# Patient Record
Sex: Female | Born: 1935 | Race: White | Hispanic: No | Smoking: Former smoker
Health system: Southern US, Community
[De-identification: ages and names within clinical notes are randomized; demographics above are authoritative.]

## PROBLEM LIST (undated history)

## (undated) DIAGNOSIS — M810 Age-related osteoporosis without current pathological fracture: Secondary | ICD-10-CM

## (undated) DIAGNOSIS — R001 Bradycardia, unspecified: Secondary | ICD-10-CM

## (undated) DIAGNOSIS — J309 Allergic rhinitis, unspecified: Secondary | ICD-10-CM

## (undated) DIAGNOSIS — E7800 Pure hypercholesterolemia, unspecified: Secondary | ICD-10-CM

## (undated) DIAGNOSIS — I872 Venous insufficiency (chronic) (peripheral): Secondary | ICD-10-CM

## (undated) DIAGNOSIS — I2584 Coronary atherosclerosis due to calcified coronary lesion: Secondary | ICD-10-CM

## (undated) DIAGNOSIS — M1990 Unspecified osteoarthritis, unspecified site: Secondary | ICD-10-CM

## (undated) DIAGNOSIS — K449 Diaphragmatic hernia without obstruction or gangrene: Secondary | ICD-10-CM

## (undated) DIAGNOSIS — K529 Noninfective gastroenteritis and colitis, unspecified: Secondary | ICD-10-CM

## (undated) DIAGNOSIS — F419 Anxiety disorder, unspecified: Secondary | ICD-10-CM

## (undated) DIAGNOSIS — M545 Low back pain, unspecified: Secondary | ICD-10-CM

## (undated) DIAGNOSIS — D099 Carcinoma in situ, unspecified: Secondary | ICD-10-CM

## (undated) DIAGNOSIS — D739 Disease of spleen, unspecified: Secondary | ICD-10-CM

## (undated) DIAGNOSIS — E538 Deficiency of other specified B group vitamins: Secondary | ICD-10-CM

## (undated) DIAGNOSIS — I5022 Chronic systolic (congestive) heart failure: Secondary | ICD-10-CM

## (undated) DIAGNOSIS — K589 Irritable bowel syndrome without diarrhea: Secondary | ICD-10-CM

## (undated) DIAGNOSIS — K5730 Diverticulosis of large intestine without perforation or abscess without bleeding: Secondary | ICD-10-CM

## (undated) DIAGNOSIS — D649 Anemia, unspecified: Secondary | ICD-10-CM

## (undated) DIAGNOSIS — K769 Liver disease, unspecified: Secondary | ICD-10-CM

## (undated) DIAGNOSIS — I2510 Atherosclerotic heart disease of native coronary artery without angina pectoris: Secondary | ICD-10-CM

## (undated) DIAGNOSIS — E611 Iron deficiency: Secondary | ICD-10-CM

## (undated) DIAGNOSIS — I10 Essential (primary) hypertension: Secondary | ICD-10-CM

## (undated) DIAGNOSIS — A0472 Enterocolitis due to Clostridium difficile, not specified as recurrent: Secondary | ICD-10-CM

## (undated) DIAGNOSIS — I4892 Unspecified atrial flutter: Secondary | ICD-10-CM

## (undated) HISTORY — DX: Disease of spleen, unspecified: D73.9

## (undated) HISTORY — DX: Low back pain, unspecified: M54.50

## (undated) HISTORY — DX: Essential (primary) hypertension: I10

## (undated) HISTORY — DX: Bradycardia, unspecified: R00.1

## (undated) HISTORY — DX: Unspecified osteoarthritis, unspecified site: M19.90

## (undated) HISTORY — DX: Age-related osteoporosis without current pathological fracture: M81.0

## (undated) HISTORY — DX: Diverticulosis of large intestine without perforation or abscess without bleeding: K57.30

## (undated) HISTORY — DX: Iron deficiency: E61.1

## (undated) HISTORY — DX: Pure hypercholesterolemia, unspecified: E78.00

## (undated) HISTORY — DX: Deficiency of other specified B group vitamins: E53.8

## (undated) HISTORY — DX: Anxiety disorder, unspecified: F41.9

## (undated) HISTORY — DX: Irritable bowel syndrome, unspecified: K58.9

## (undated) HISTORY — DX: Liver disease, unspecified: K76.9

## (undated) HISTORY — DX: Diaphragmatic hernia without obstruction or gangrene: K44.9

## (undated) HISTORY — DX: Unspecified atrial flutter: I48.92

## (undated) HISTORY — DX: Low back pain: M54.5

## (undated) HISTORY — DX: Noninfective gastroenteritis and colitis, unspecified: K52.9

## (undated) HISTORY — PX: OTHER SURGICAL HISTORY: SHX169

## (undated) HISTORY — PX: REPLACEMENT TOTAL KNEE: SUR1224

## (undated) HISTORY — DX: Coronary atherosclerosis due to calcified coronary lesion: I25.84

## (undated) HISTORY — PX: ABDOMINAL HYSTERECTOMY: SHX81

## (undated) HISTORY — DX: Allergic rhinitis, unspecified: J30.9

## (undated) HISTORY — DX: Atherosclerotic heart disease of native coronary artery without angina pectoris: I25.10

## (undated) HISTORY — DX: Anemia, unspecified: D64.9

## (undated) HISTORY — DX: Venous insufficiency (chronic) (peripheral): I87.2

## (undated) HISTORY — DX: Chronic systolic (congestive) heart failure: I50.22

---

## 1997-07-03 ENCOUNTER — Ambulatory Visit (HOSPITAL_COMMUNITY): Admission: RE | Admit: 1997-07-03 | Discharge: 1997-07-03 | Payer: Self-pay | Admitting: Oral Surgery

## 2002-08-13 ENCOUNTER — Encounter: Payer: Self-pay | Admitting: Urology

## 2002-08-20 ENCOUNTER — Observation Stay (HOSPITAL_COMMUNITY): Admission: RE | Admit: 2002-08-20 | Discharge: 2002-08-21 | Payer: Self-pay | Admitting: Urology

## 2004-10-07 ENCOUNTER — Ambulatory Visit: Payer: Self-pay | Admitting: Pulmonary Disease

## 2004-12-23 ENCOUNTER — Ambulatory Visit: Payer: Self-pay | Admitting: Pulmonary Disease

## 2004-12-30 ENCOUNTER — Ambulatory Visit: Payer: Self-pay | Admitting: Pulmonary Disease

## 2005-04-28 ENCOUNTER — Ambulatory Visit: Payer: Self-pay | Admitting: Pulmonary Disease

## 2005-08-25 ENCOUNTER — Ambulatory Visit: Payer: Self-pay | Admitting: Pulmonary Disease

## 2005-12-21 ENCOUNTER — Ambulatory Visit: Payer: Self-pay | Admitting: Pulmonary Disease

## 2006-06-24 ENCOUNTER — Ambulatory Visit: Payer: Self-pay | Admitting: Pulmonary Disease

## 2006-06-24 LAB — CONVERTED CEMR LAB
ALT: 15 units/L (ref 0–40)
AST: 21 units/L (ref 0–37)
Albumin: 3.8 g/dL (ref 3.5–5.2)
Basophils Absolute: 0 10*3/uL (ref 0.0–0.1)
Calcium: 9.2 mg/dL (ref 8.4–10.5)
Chloride: 104 meq/L (ref 96–112)
Direct LDL: 117.5 mg/dL
Eosinophils Absolute: 0.1 10*3/uL (ref 0.0–0.6)
GFR calc non Af Amer: 74 mL/min
Hemoglobin, Urine: NEGATIVE
Ketones, ur: NEGATIVE mg/dL
MCHC: 33.5 g/dL (ref 30.0–36.0)
MCV: 88.2 fL (ref 78.0–100.0)
Nitrite: NEGATIVE
Platelets: 250 10*3/uL (ref 150–400)
RBC / HPF: NONE SEEN
RBC: 4.16 M/uL (ref 3.87–5.11)
Total CHOL/HDL Ratio: 2.8
Triglycerides: 86 mg/dL (ref 0–149)
Urine Glucose: NEGATIVE mg/dL
WBC: 6 10*3/uL (ref 4.5–10.5)

## 2006-12-13 ENCOUNTER — Ambulatory Visit: Payer: Self-pay | Admitting: Pulmonary Disease

## 2007-02-08 ENCOUNTER — Telehealth (INDEPENDENT_AMBULATORY_CARE_PROVIDER_SITE_OTHER): Payer: Self-pay | Admitting: *Deleted

## 2007-02-13 DIAGNOSIS — J309 Allergic rhinitis, unspecified: Secondary | ICD-10-CM | POA: Insufficient documentation

## 2007-02-13 DIAGNOSIS — E7800 Pure hypercholesterolemia, unspecified: Secondary | ICD-10-CM | POA: Insufficient documentation

## 2007-02-13 DIAGNOSIS — F411 Generalized anxiety disorder: Secondary | ICD-10-CM | POA: Insufficient documentation

## 2007-02-13 DIAGNOSIS — K589 Irritable bowel syndrome without diarrhea: Secondary | ICD-10-CM | POA: Insufficient documentation

## 2007-02-13 DIAGNOSIS — M1990 Unspecified osteoarthritis, unspecified site: Secondary | ICD-10-CM | POA: Insufficient documentation

## 2007-02-13 DIAGNOSIS — K449 Diaphragmatic hernia without obstruction or gangrene: Secondary | ICD-10-CM | POA: Insufficient documentation

## 2007-02-13 DIAGNOSIS — I10 Essential (primary) hypertension: Secondary | ICD-10-CM | POA: Insufficient documentation

## 2007-03-08 ENCOUNTER — Ambulatory Visit: Payer: Self-pay | Admitting: Pulmonary Disease

## 2007-03-16 ENCOUNTER — Ambulatory Visit: Payer: Self-pay | Admitting: Pulmonary Disease

## 2007-03-23 ENCOUNTER — Encounter: Payer: Self-pay | Admitting: Pulmonary Disease

## 2007-04-10 ENCOUNTER — Ambulatory Visit: Payer: Self-pay | Admitting: Pulmonary Disease

## 2007-04-10 DIAGNOSIS — K5730 Diverticulosis of large intestine without perforation or abscess without bleeding: Secondary | ICD-10-CM | POA: Insufficient documentation

## 2007-04-13 ENCOUNTER — Ambulatory Visit: Payer: Self-pay | Admitting: Pulmonary Disease

## 2007-04-20 ENCOUNTER — Encounter: Payer: Self-pay | Admitting: Pulmonary Disease

## 2007-04-24 LAB — CONVERTED CEMR LAB
ALT: 16 units/L (ref 0–35)
Albumin: 3.3 g/dL — ABNORMAL LOW (ref 3.5–5.2)
Alkaline Phosphatase: 31 units/L — ABNORMAL LOW (ref 39–117)
BUN: 12 mg/dL (ref 6–23)
Basophils Absolute: 0 10*3/uL (ref 0.0–0.1)
Basophils Relative: 0.7 % (ref 0.0–1.0)
CO2: 29 meq/L (ref 19–32)
Calcium: 8.7 mg/dL (ref 8.4–10.5)
Cholesterol: 198 mg/dL (ref 0–200)
Eosinophils Absolute: 0.2 10*3/uL (ref 0.0–0.6)
GFR calc Af Amer: 89 mL/min
GFR calc non Af Amer: 74 mL/min
HDL: 72.8 mg/dL (ref >39.0)
Lymphocytes Relative: 20 % (ref 12.0–46.0)
MCHC: 32.8 g/dL (ref 30.0–36.0)
Monocytes Relative: 11.9 % — ABNORMAL HIGH (ref 3.0–11.0)
Neutro Abs: 3.7 10*3/uL (ref 1.4–7.7)
Platelets: 241 10*3/uL (ref 150–400)
Triglycerides: 48 mg/dL (ref 0–149)
VLDL: 10 mg/dL (ref 0–40)
Vit D, 1,25-Dihydroxy: 30 (ref 30–89)

## 2007-04-28 ENCOUNTER — Telehealth (INDEPENDENT_AMBULATORY_CARE_PROVIDER_SITE_OTHER): Payer: Self-pay | Admitting: *Deleted

## 2007-05-05 ENCOUNTER — Telehealth (INDEPENDENT_AMBULATORY_CARE_PROVIDER_SITE_OTHER): Payer: Self-pay | Admitting: *Deleted

## 2007-05-17 ENCOUNTER — Encounter: Payer: Self-pay | Admitting: Pulmonary Disease

## 2007-08-03 ENCOUNTER — Encounter: Admission: RE | Admit: 2007-08-03 | Discharge: 2007-08-03 | Payer: Self-pay | Admitting: Specialist

## 2007-08-12 ENCOUNTER — Ambulatory Visit: Payer: Self-pay | Admitting: Pulmonary Disease

## 2007-08-12 ENCOUNTER — Inpatient Hospital Stay (HOSPITAL_COMMUNITY): Admission: EM | Admit: 2007-08-12 | Discharge: 2007-08-17 | Payer: Self-pay | Admitting: Family Medicine

## 2007-08-22 ENCOUNTER — Encounter: Payer: Self-pay | Admitting: Pulmonary Disease

## 2007-08-23 ENCOUNTER — Ambulatory Visit: Payer: Self-pay | Admitting: Internal Medicine

## 2007-08-23 DIAGNOSIS — R32 Unspecified urinary incontinence: Secondary | ICD-10-CM | POA: Insufficient documentation

## 2007-08-25 ENCOUNTER — Encounter: Payer: Self-pay | Admitting: Pulmonary Disease

## 2007-08-25 ENCOUNTER — Telehealth: Payer: Self-pay | Admitting: Pulmonary Disease

## 2007-08-28 ENCOUNTER — Encounter: Payer: Self-pay | Admitting: Pulmonary Disease

## 2007-09-20 ENCOUNTER — Ambulatory Visit: Payer: Self-pay | Admitting: Pulmonary Disease

## 2007-09-28 ENCOUNTER — Encounter: Payer: Self-pay | Admitting: Pulmonary Disease

## 2007-10-02 ENCOUNTER — Telehealth: Payer: Self-pay | Admitting: Pulmonary Disease

## 2007-10-09 ENCOUNTER — Encounter: Payer: Self-pay | Admitting: Pulmonary Disease

## 2007-11-29 ENCOUNTER — Ambulatory Visit: Payer: Self-pay | Admitting: Pulmonary Disease

## 2007-12-12 ENCOUNTER — Encounter: Payer: Self-pay | Admitting: Pulmonary Disease

## 2008-01-12 ENCOUNTER — Encounter: Payer: Self-pay | Admitting: Pulmonary Disease

## 2008-01-22 ENCOUNTER — Telehealth: Payer: Self-pay | Admitting: Pulmonary Disease

## 2008-01-22 ENCOUNTER — Ambulatory Visit: Payer: Self-pay | Admitting: Pulmonary Disease

## 2008-01-27 DIAGNOSIS — M545 Low back pain, unspecified: Secondary | ICD-10-CM | POA: Insufficient documentation

## 2008-02-27 ENCOUNTER — Ambulatory Visit: Payer: Self-pay | Admitting: Pulmonary Disease

## 2008-03-20 ENCOUNTER — Encounter: Payer: Self-pay | Admitting: Pulmonary Disease

## 2008-05-04 ENCOUNTER — Encounter: Payer: Self-pay | Admitting: Pulmonary Disease

## 2008-05-22 ENCOUNTER — Ambulatory Visit: Payer: Self-pay | Admitting: Pulmonary Disease

## 2008-05-27 ENCOUNTER — Encounter: Admission: RE | Admit: 2008-05-27 | Discharge: 2008-05-27 | Payer: Self-pay | Admitting: Neurological Surgery

## 2008-05-29 ENCOUNTER — Ambulatory Visit: Payer: Self-pay | Admitting: Pulmonary Disease

## 2008-05-30 LAB — CONVERTED CEMR LAB
AST: 20 units/L (ref 0–37)
Albumin: 4 g/dL (ref 3.5–5.2)
Alkaline Phosphatase: 35 units/L — ABNORMAL LOW (ref 39–117)
Basophils Relative: 0.1 % (ref 0.0–3.0)
Bilirubin, Direct: 0.1 mg/dL (ref 0.0–0.3)
CO2: 30 meq/L (ref 19–32)
Chloride: 98 meq/L (ref 96–112)
Cholesterol: 263 mg/dL — ABNORMAL HIGH (ref 0–200)
Eosinophils Relative: 0.5 % (ref 0.0–5.0)
Glucose, Bld: 94 mg/dL (ref 70–99)
MCHC: 34.7 g/dL (ref 30.0–36.0)
Monocytes Absolute: 0.6 10*3/uL (ref 0.1–1.0)
Neutrophils Relative %: 78 % — ABNORMAL HIGH (ref 43.0–77.0)
Potassium: 4.3 meq/L (ref 3.5–5.1)
Sodium: 134 meq/L — ABNORMAL LOW (ref 135–145)
Total CHOL/HDL Ratio: 3
Total Protein: 6.8 g/dL (ref 6.0–8.3)
VLDL: 13 mg/dL (ref 0.0–40.0)

## 2008-06-05 ENCOUNTER — Telehealth (INDEPENDENT_AMBULATORY_CARE_PROVIDER_SITE_OTHER): Payer: Self-pay | Admitting: *Deleted

## 2008-06-13 ENCOUNTER — Inpatient Hospital Stay (HOSPITAL_COMMUNITY): Admission: RE | Admit: 2008-06-13 | Discharge: 2008-06-18 | Payer: Self-pay | Admitting: Neurological Surgery

## 2008-06-21 ENCOUNTER — Telehealth: Payer: Self-pay | Admitting: Pulmonary Disease

## 2008-06-29 HISTORY — PX: OTHER SURGICAL HISTORY: SHX169

## 2008-08-02 ENCOUNTER — Telehealth: Payer: Self-pay | Admitting: Pulmonary Disease

## 2008-08-12 ENCOUNTER — Encounter: Admission: RE | Admit: 2008-08-12 | Discharge: 2008-08-12 | Payer: Self-pay | Admitting: Neurological Surgery

## 2008-08-22 ENCOUNTER — Emergency Department (HOSPITAL_COMMUNITY): Admission: EM | Admit: 2008-08-22 | Discharge: 2008-08-22 | Payer: Self-pay | Admitting: Family Medicine

## 2008-08-26 ENCOUNTER — Ambulatory Visit: Payer: Self-pay | Admitting: Pulmonary Disease

## 2008-08-26 ENCOUNTER — Encounter: Payer: Self-pay | Admitting: Adult Health

## 2008-08-27 ENCOUNTER — Telehealth: Payer: Self-pay | Admitting: Adult Health

## 2008-08-28 ENCOUNTER — Ambulatory Visit: Payer: Self-pay | Admitting: Pulmonary Disease

## 2008-08-28 ENCOUNTER — Inpatient Hospital Stay (HOSPITAL_COMMUNITY): Admission: AD | Admit: 2008-08-28 | Discharge: 2008-09-05 | Payer: Self-pay | Admitting: Internal Medicine

## 2008-08-28 ENCOUNTER — Ambulatory Visit: Payer: Self-pay | Admitting: Internal Medicine

## 2008-08-28 ENCOUNTER — Encounter: Payer: Self-pay | Admitting: Adult Health

## 2008-08-28 LAB — CONVERTED CEMR LAB
ALT: 20 units/L (ref 0–35)
AST: 21 units/L (ref 0–37)
Basophils Absolute: 0 10*3/uL (ref 0.0–0.1)
Bilirubin, Direct: 0.1 mg/dL (ref 0.0–0.3)
Chloride: 103 meq/L (ref 96–112)
Eosinophils Absolute: 0.1 10*3/uL (ref 0.0–0.7)
Hemoglobin, Urine: NEGATIVE
Lymphocytes Relative: 5.8 % — ABNORMAL LOW (ref 12.0–46.0)
MCHC: 33.5 g/dL (ref 30.0–36.0)
Neutrophils Relative %: 85 % — ABNORMAL HIGH (ref 43.0–77.0)
Potassium: 4 meq/L (ref 3.5–5.1)
RBC: 3.44 M/uL — ABNORMAL LOW (ref 3.87–5.11)
RDW: 12.8 % (ref 11.5–14.6)
Sodium: 139 meq/L (ref 135–145)
Total Bilirubin: 0.7 mg/dL (ref 0.3–1.2)
Urine Glucose: NEGATIVE mg/dL

## 2008-08-29 ENCOUNTER — Encounter: Payer: Self-pay | Admitting: Pulmonary Disease

## 2008-08-30 ENCOUNTER — Encounter: Payer: Self-pay | Admitting: Pulmonary Disease

## 2008-08-30 ENCOUNTER — Ambulatory Visit: Payer: Self-pay | Admitting: Internal Medicine

## 2008-09-05 ENCOUNTER — Encounter: Payer: Self-pay | Admitting: Pulmonary Disease

## 2008-10-21 ENCOUNTER — Telehealth: Payer: Self-pay | Admitting: Pulmonary Disease

## 2008-10-21 ENCOUNTER — Ambulatory Visit: Payer: Self-pay | Admitting: Pulmonary Disease

## 2008-10-22 ENCOUNTER — Encounter: Payer: Self-pay | Admitting: Pulmonary Disease

## 2008-10-23 ENCOUNTER — Encounter: Admission: RE | Admit: 2008-10-23 | Discharge: 2008-10-23 | Payer: Self-pay | Admitting: Neurological Surgery

## 2008-10-23 LAB — CONVERTED CEMR LAB
Nitrite: NEGATIVE
Specific Gravity, Urine: <=1.005 (ref 1.000–1.030)
Total Protein, Urine: NEGATIVE mg/dL
Urine Glucose: NEGATIVE mg/dL
Urobilinogen, UA: 0.2 (ref 0.0–1.0)

## 2008-10-30 ENCOUNTER — Encounter: Payer: Self-pay | Admitting: Pulmonary Disease

## 2008-11-19 ENCOUNTER — Ambulatory Visit: Payer: Self-pay | Admitting: Pulmonary Disease

## 2008-11-24 LAB — CONVERTED CEMR LAB
ALT: 17 units/L (ref 0–35)
AST: 20 units/L (ref 0–37)
Alkaline Phosphatase: 55 units/L (ref 39–117)
BUN: 13 mg/dL (ref 6–23)
Basophils Relative: 0.5 % (ref 0.0–3.0)
Bilirubin, Direct: 0 mg/dL (ref 0.0–0.3)
Chloride: 105 meq/L (ref 96–112)
Eosinophils Relative: 1.1 % (ref 0.0–5.0)
GFR calc non Af Amer: 64.18 mL/min (ref >60)
Iron: 89 ug/dL (ref 42–145)
MCV: 91.9 fL (ref 78.0–100.0)
Monocytes Absolute: 0.8 10*3/uL (ref 0.1–1.0)
Monocytes Relative: 12.1 % — ABNORMAL HIGH (ref 3.0–12.0)
Neutrophils Relative %: 66.4 % (ref 43.0–77.0)
Potassium: 4.4 meq/L (ref 3.5–5.1)
RBC: 3.93 M/uL (ref 3.87–5.11)
Saturation Ratios: 32.9 % (ref 20.0–50.0)
Sodium: 144 meq/L (ref 135–145)
Total Bilirubin: 0.7 mg/dL (ref 0.3–1.2)
Transferrin: 193.4 mg/dL — ABNORMAL LOW (ref 212.0–360.0)
WBC: 6.7 10*3/uL (ref 4.5–10.5)

## 2008-11-25 ENCOUNTER — Telehealth: Payer: Self-pay | Admitting: Pulmonary Disease

## 2008-11-26 ENCOUNTER — Encounter: Payer: Self-pay | Admitting: Pulmonary Disease

## 2008-12-05 ENCOUNTER — Encounter: Payer: Self-pay | Admitting: Pulmonary Disease

## 2009-01-27 ENCOUNTER — Ambulatory Visit: Payer: Self-pay | Admitting: Pulmonary Disease

## 2009-01-27 DIAGNOSIS — I872 Venous insufficiency (chronic) (peripheral): Secondary | ICD-10-CM | POA: Insufficient documentation

## 2009-01-28 LAB — CONVERTED CEMR LAB
BUN: 11 mg/dL (ref 6–23)
Calcium: 9.7 mg/dL (ref 8.4–10.5)
Eosinophils Absolute: 0.1 10*3/uL (ref 0.0–0.7)
Eosinophils Relative: 2.1 % (ref 0.0–5.0)
GFR calc non Af Amer: 73.49 mL/min (ref >60)
Glucose, Bld: 86 mg/dL (ref 70–99)
HCT: 37.6 % (ref 36.0–46.0)
Lymphs Abs: 1.7 10*3/uL (ref 0.7–4.0)
MCHC: 34.7 g/dL (ref 30.0–36.0)
MCV: 91.6 fL (ref 78.0–100.0)
Monocytes Absolute: 0.8 10*3/uL (ref 0.1–1.0)
Neutrophils Relative %: 60.1 % (ref 43.0–77.0)
Platelets: 206 10*3/uL (ref 150.0–400.0)
Potassium: 4.2 meq/L (ref 3.5–5.1)
Sodium: 142 meq/L (ref 135–145)

## 2009-01-29 ENCOUNTER — Encounter: Payer: Self-pay | Admitting: Pulmonary Disease

## 2009-02-05 ENCOUNTER — Encounter: Payer: Self-pay | Admitting: Pulmonary Disease

## 2009-03-05 ENCOUNTER — Telehealth (INDEPENDENT_AMBULATORY_CARE_PROVIDER_SITE_OTHER): Payer: Self-pay | Admitting: *Deleted

## 2009-03-26 ENCOUNTER — Telehealth: Payer: Self-pay | Admitting: Pulmonary Disease

## 2009-04-01 HISTORY — PX: LAPAROSCOPIC CHOLECYSTECTOMY: SUR755

## 2009-04-07 ENCOUNTER — Telehealth (INDEPENDENT_AMBULATORY_CARE_PROVIDER_SITE_OTHER): Payer: Self-pay | Admitting: *Deleted

## 2009-04-09 ENCOUNTER — Telehealth: Payer: Self-pay | Admitting: Pulmonary Disease

## 2009-04-14 ENCOUNTER — Telehealth (INDEPENDENT_AMBULATORY_CARE_PROVIDER_SITE_OTHER): Payer: Self-pay | Admitting: *Deleted

## 2009-04-17 ENCOUNTER — Telehealth: Payer: Self-pay | Admitting: Pulmonary Disease

## 2009-04-18 ENCOUNTER — Encounter (INDEPENDENT_AMBULATORY_CARE_PROVIDER_SITE_OTHER): Payer: Self-pay | Admitting: *Deleted

## 2009-04-18 ENCOUNTER — Inpatient Hospital Stay (HOSPITAL_COMMUNITY): Admission: EM | Admit: 2009-04-18 | Discharge: 2009-04-25 | Payer: Self-pay | Admitting: Emergency Medicine

## 2009-04-19 ENCOUNTER — Encounter (INDEPENDENT_AMBULATORY_CARE_PROVIDER_SITE_OTHER): Payer: Self-pay | Admitting: *Deleted

## 2009-04-20 ENCOUNTER — Encounter (INDEPENDENT_AMBULATORY_CARE_PROVIDER_SITE_OTHER): Payer: Self-pay | Admitting: *Deleted

## 2009-04-20 ENCOUNTER — Encounter (INDEPENDENT_AMBULATORY_CARE_PROVIDER_SITE_OTHER): Payer: Self-pay | Admitting: Internal Medicine

## 2009-04-21 ENCOUNTER — Ambulatory Visit: Payer: Self-pay | Admitting: Internal Medicine

## 2009-04-21 ENCOUNTER — Telehealth (INDEPENDENT_AMBULATORY_CARE_PROVIDER_SITE_OTHER): Payer: Self-pay | Admitting: *Deleted

## 2009-04-21 ENCOUNTER — Encounter (INDEPENDENT_AMBULATORY_CARE_PROVIDER_SITE_OTHER): Payer: Self-pay | Admitting: *Deleted

## 2009-04-22 ENCOUNTER — Telehealth: Payer: Self-pay | Admitting: Physician Assistant

## 2009-04-22 ENCOUNTER — Encounter (INDEPENDENT_AMBULATORY_CARE_PROVIDER_SITE_OTHER): Payer: Self-pay | Admitting: *Deleted

## 2009-04-23 ENCOUNTER — Encounter (INDEPENDENT_AMBULATORY_CARE_PROVIDER_SITE_OTHER): Payer: Self-pay | Admitting: Internal Medicine

## 2009-04-23 ENCOUNTER — Encounter (INDEPENDENT_AMBULATORY_CARE_PROVIDER_SITE_OTHER): Payer: Self-pay | Admitting: *Deleted

## 2009-05-02 ENCOUNTER — Encounter (INDEPENDENT_AMBULATORY_CARE_PROVIDER_SITE_OTHER): Payer: Self-pay | Admitting: *Deleted

## 2009-05-06 ENCOUNTER — Encounter: Payer: Self-pay | Admitting: Pulmonary Disease

## 2009-05-20 ENCOUNTER — Ambulatory Visit: Payer: Self-pay | Admitting: Pulmonary Disease

## 2009-05-25 LAB — CONVERTED CEMR LAB
Alkaline Phosphatase: 68 units/L (ref 39–117)
Basophils Absolute: 0 10*3/uL (ref 0.0–0.1)
Basophils Relative: 0.5 % (ref 0.0–3.0)
Bilirubin, Direct: 0.1 mg/dL (ref 0.0–0.3)
CO2: 30 meq/L (ref 19–32)
Calcium: 9.3 mg/dL (ref 8.4–10.5)
Chloride: 106 meq/L (ref 96–112)
Creatinine, Ser: 1.1 mg/dL (ref 0.4–1.2)
Eosinophils Absolute: 0.2 10*3/uL (ref 0.0–0.7)
Hemoglobin: 10.7 g/dL — ABNORMAL LOW (ref 12.0–15.0)
Lymphs Abs: 1.6 10*3/uL (ref 0.7–4.0)
MCHC: 33.3 g/dL (ref 30.0–36.0)
MCV: 92 fL (ref 78.0–100.0)
Monocytes Absolute: 0.7 10*3/uL (ref 0.1–1.0)
Neutro Abs: 6.2 10*3/uL (ref 1.4–7.7)
RBC: 3.5 M/uL — ABNORMAL LOW (ref 3.87–5.11)
RDW: 13.4 % (ref 11.5–14.6)
Sodium: 140 meq/L (ref 135–145)
Total Bilirubin: 0.4 mg/dL (ref 0.3–1.2)
Total Protein: 6.8 g/dL (ref 6.0–8.3)

## 2009-05-26 ENCOUNTER — Ambulatory Visit: Payer: Self-pay | Admitting: Internal Medicine

## 2009-05-26 DIAGNOSIS — A0472 Enterocolitis due to Clostridium difficile, not specified as recurrent: Secondary | ICD-10-CM

## 2009-05-26 HISTORY — DX: Enterocolitis due to Clostridium difficile, not specified as recurrent: A04.72

## 2009-07-29 ENCOUNTER — Ambulatory Visit: Payer: Self-pay | Admitting: Internal Medicine

## 2009-07-30 ENCOUNTER — Ambulatory Visit: Payer: Self-pay | Admitting: Internal Medicine

## 2009-08-05 ENCOUNTER — Telehealth (INDEPENDENT_AMBULATORY_CARE_PROVIDER_SITE_OTHER): Payer: Self-pay | Admitting: *Deleted

## 2009-08-06 ENCOUNTER — Ambulatory Visit: Payer: Self-pay | Admitting: Pulmonary Disease

## 2009-08-08 ENCOUNTER — Telehealth (INDEPENDENT_AMBULATORY_CARE_PROVIDER_SITE_OTHER): Payer: Self-pay | Admitting: *Deleted

## 2009-08-18 ENCOUNTER — Ambulatory Visit: Payer: Self-pay | Admitting: Pulmonary Disease

## 2009-08-20 ENCOUNTER — Ambulatory Visit: Payer: Self-pay | Admitting: Pulmonary Disease

## 2009-08-24 LAB — CONVERTED CEMR LAB
Basophils Relative: 0.2 % (ref 0.0–3.0)
Bilirubin, Direct: 0.1 mg/dL (ref 0.0–0.3)
CO2: 32 meq/L (ref 19–32)
Calcium: 9 mg/dL (ref 8.4–10.5)
Creatinine, Ser: 0.8 mg/dL (ref 0.4–1.2)
Eosinophils Absolute: 0.1 10*3/uL (ref 0.0–0.7)
GFR calc non Af Amer: 69.36 mL/min (ref >60)
HCT: 33.9 % — ABNORMAL LOW (ref 36.0–46.0)
HDL: 76.9 mg/dL (ref >39.00)
Hemoglobin: 11.8 g/dL — ABNORMAL LOW (ref 12.0–15.0)
LDL Cholesterol: 97 mg/dL (ref 0–99)
Lymphocytes Relative: 16.5 % (ref 12.0–46.0)
Lymphs Abs: 1.2 10*3/uL (ref 0.7–4.0)
MCHC: 34.8 g/dL (ref 30.0–36.0)
Monocytes Relative: 8.1 % (ref 3.0–12.0)
Neutro Abs: 5.4 10*3/uL (ref 1.4–7.7)
RBC: 3.75 M/uL — ABNORMAL LOW (ref 3.87–5.11)
Saturation Ratios: 37.2 % (ref 20.0–50.0)
Sodium: 140 meq/L (ref 135–145)
Total CHOL/HDL Ratio: 2
Total Protein: 6.5 g/dL (ref 6.0–8.3)
Transferrin: 191.8 mg/dL — ABNORMAL LOW (ref 212.0–360.0)
Triglycerides: 76 mg/dL (ref 0.0–149.0)
VLDL: 15.2 mg/dL (ref 0.0–40.0)

## 2009-09-16 ENCOUNTER — Encounter: Payer: Self-pay | Admitting: Pulmonary Disease

## 2009-10-07 ENCOUNTER — Encounter: Payer: Self-pay | Admitting: Pulmonary Disease

## 2009-10-14 ENCOUNTER — Encounter (INDEPENDENT_AMBULATORY_CARE_PROVIDER_SITE_OTHER): Payer: Self-pay | Admitting: *Deleted

## 2009-11-05 ENCOUNTER — Encounter: Payer: Self-pay | Admitting: Pulmonary Disease

## 2009-11-28 ENCOUNTER — Encounter: Payer: Self-pay | Admitting: Pulmonary Disease

## 2009-12-16 ENCOUNTER — Ambulatory Visit: Payer: Self-pay | Admitting: Pulmonary Disease

## 2010-01-15 ENCOUNTER — Encounter: Payer: Self-pay | Admitting: Pulmonary Disease

## 2010-03-05 ENCOUNTER — Inpatient Hospital Stay (HOSPITAL_COMMUNITY)
Admission: RE | Admit: 2010-03-05 | Discharge: 2010-03-09 | Payer: Self-pay | Source: Home / Self Care | Attending: Specialist | Admitting: Specialist

## 2010-03-05 LAB — TYPE AND SCREEN
ABO/RH(D): A POS
Antibody Screen: NEGATIVE

## 2010-03-05 LAB — ABO/RH: ABO/RH(D): A POS

## 2010-03-06 LAB — BASIC METABOLIC PANEL
BUN: 6 mg/dL (ref 6–23)
CO2: 29 mEq/L (ref 19–32)
Calcium: 9.1 mg/dL (ref 8.4–10.5)
Chloride: 104 mEq/L (ref 96–112)
Creatinine, Ser: 0.73 mg/dL (ref 0.4–1.2)
GFR calc Af Amer: >60 mL/min (ref >60)
GFR calc non Af Amer: >60 mL/min (ref >60)
Glucose, Bld: 101 mg/dL — ABNORMAL HIGH (ref 70–99)
Potassium: 3.7 mEq/L (ref 3.5–5.1)
Sodium: 138 mEq/L (ref 135–145)

## 2010-03-06 LAB — PROTIME-INR
INR: 1.13 (ref 0.00–1.49)
Prothrombin Time: 14.7 seconds (ref 11.6–15.2)

## 2010-03-06 LAB — CBC
HCT: 29.4 % — ABNORMAL LOW (ref 36.0–46.0)
Hemoglobin: 9.4 g/dL — ABNORMAL LOW (ref 12.0–15.0)
MCH: 30.2 pg (ref 26.0–34.0)
MCHC: 32 g/dL (ref 30.0–36.0)
MCV: 94.5 fL (ref 78.0–100.0)
Platelets: 138 10*3/uL — ABNORMAL LOW (ref 150–400)
RBC: 3.11 MIL/uL — ABNORMAL LOW (ref 3.87–5.11)
RDW: 13.3 % (ref 11.5–15.5)
WBC: 8.2 10*3/uL (ref 4.0–10.5)

## 2010-03-16 LAB — CBC
HCT: 30.3 % — ABNORMAL LOW (ref 36.0–46.0)
HCT: 27.8 % — ABNORMAL LOW (ref 36.0–46.0)
HCT: 27.9 % — ABNORMAL LOW (ref 36.0–46.0)
Hemoglobin: 10 g/dL — ABNORMAL LOW (ref 12.0–15.0)
Hemoglobin: 9 g/dL — ABNORMAL LOW (ref 12.0–15.0)
Hemoglobin: 9.1 g/dL — ABNORMAL LOW (ref 12.0–15.0)
MCH: 30.9 pg (ref 26.0–34.0)
MCH: 30.4 pg (ref 26.0–34.0)
MCH: 30.8 pg (ref 26.0–34.0)
MCHC: 33 g/dL (ref 30.0–36.0)
MCHC: 32.4 g/dL (ref 30.0–36.0)
MCHC: 32.6 g/dL (ref 30.0–36.0)
MCV: 93.5 fL (ref 78.0–100.0)
MCV: 93.9 fL (ref 78.0–100.0)
MCV: 94.6 fL (ref 78.0–100.0)
Platelets: 143 10*3/uL — ABNORMAL LOW (ref 150–400)
Platelets: 150 10*3/uL (ref 150–400)
Platelets: 174 10*3/uL (ref 150–400)
RBC: 3.24 MIL/uL — ABNORMAL LOW (ref 3.87–5.11)
RBC: 2.96 MIL/uL — ABNORMAL LOW (ref 3.87–5.11)
RBC: 2.95 MIL/uL — ABNORMAL LOW (ref 3.87–5.11)
RDW: 13.1 % (ref 11.5–15.5)
RDW: 13.3 % (ref 11.5–15.5)
RDW: 13.2 % (ref 11.5–15.5)
WBC: 9.9 10*3/uL (ref 4.0–10.5)
WBC: 8.7 10*3/uL (ref 4.0–10.5)
WBC: 6.5 10*3/uL (ref 4.0–10.5)

## 2010-03-16 LAB — BASIC METABOLIC PANEL
BUN: 7 mg/dL (ref 6–23)
BUN: 7 mg/dL (ref 6–23)
BUN: 8 mg/dL (ref 6–23)
CO2: 31 mEq/L (ref 19–32)
CO2: 31 mEq/L (ref 19–32)
CO2: 32 mEq/L (ref 19–32)
Calcium: 9.1 mg/dL (ref 8.4–10.5)
Calcium: 8.6 mg/dL (ref 8.4–10.5)
Calcium: 8.8 mg/dL (ref 8.4–10.5)
Chloride: 100 mEq/L (ref 96–112)
Chloride: 102 mEq/L (ref 96–112)
Chloride: 102 mEq/L (ref 96–112)
Creatinine, Ser: 0.69 mg/dL (ref 0.4–1.2)
Creatinine, Ser: 0.83 mg/dL (ref 0.4–1.2)
Creatinine, Ser: 0.72 mg/dL (ref 0.4–1.2)
GFR calc Af Amer: >60 mL/min (ref >60)
GFR calc Af Amer: >60 mL/min (ref >60)
GFR calc Af Amer: >60 mL/min (ref >60)
GFR calc non Af Amer: >60 mL/min (ref >60)
GFR calc non Af Amer: >60 mL/min (ref >60)
GFR calc non Af Amer: >60 mL/min (ref >60)
Glucose, Bld: 112 mg/dL — ABNORMAL HIGH (ref 70–99)
Glucose, Bld: 104 mg/dL — ABNORMAL HIGH (ref 70–99)
Glucose, Bld: 84 mg/dL (ref 70–99)
Potassium: 4 mEq/L (ref 3.5–5.1)
Potassium: 3.8 mEq/L (ref 3.5–5.1)
Potassium: 3.9 mEq/L (ref 3.5–5.1)
Sodium: 138 mEq/L (ref 135–145)
Sodium: 139 mEq/L (ref 135–145)
Sodium: 141 mEq/L (ref 135–145)

## 2010-03-16 LAB — PROTIME-INR
INR: 1.28 (ref 0.00–1.49)
INR: 1.31 (ref 0.00–1.49)
INR: 1.33 (ref 0.00–1.49)
Prothrombin Time: 16.2 seconds — ABNORMAL HIGH (ref 11.6–15.2)
Prothrombin Time: 16.5 seconds — ABNORMAL HIGH (ref 11.6–15.2)
Prothrombin Time: 16.7 seconds — ABNORMAL HIGH (ref 11.6–15.2)

## 2010-04-02 NOTE — Consult Note (Signed)
Summary: [facility]   Imported By: Sherian Rein 10/13/2009 14:16:19  _____________________________________________________________________  External Attachment:    Type:   Image     Comment:   External Document

## 2010-04-02 NOTE — Progress Notes (Signed)
Summary: still sick  Phone Note Call from Patient Call back at [facility] Phone [phone]   Caller: Patient Call For: nadel Reason for Call: Talk to Nurse Summary of Call: chills started yesterday, diarrhea, still taking diarrhea med, taking everything she was told to take.  Not feeling well, please advise.  Been going on a week.  Could the meds be adding to this, change? Initial call taken by: Eugene Gavia,  April 14, 2009 8:21 AM  Follow-up for Phone Call        Pt states diarrhea stopped for a couple of days, but started back yesterday. Pt also states chills and low grade fever of 99.3 started again yesterday as well.  Pt is taking delsym and mucinex for congestion and she still has 2 tablets of lomotil left.  Pt is wondering if any of her meds could be contributing to the diarrhea. Please advise. Carron Curie CMA  April 14, 2009 9:18 AM   Additional Follow-up for Phone Call Additional follow up Details #1::        per SN---flagyl 250mg    #21  1 by mouth three times a day until gone and to follow up with GI if the symptoms persist.  thanks Randell Loop CMA  April 14, 2009 11:39 AM     Additional Follow-up for Phone Call Additional follow up Details #2::    Spoke with pt and made aware of recs per SN.  Pt verbalized understanding and rx was sent to pharm. Follow-up by: Vernie Murders,  April 14, 2009 11:51 AM  New/Updated Medications: FLAGYL 250 MG TABS (METRONIDAZOLE) 1 by mouth three times a day Prescriptions: FLAGYL 250 MG TABS (METRONIDAZOLE) 1 by mouth three times a day  #21 x 0   Entered by:   Vernie Murders   Authorized by:   Michele Mcalpine MD   Signed by:   Vernie Murders on 04/14/2009   Method used:   Electronically to        [facility] [phone]* (retail)       [address] [place], [place]  [address]       Ph: [phone] or [phone]       Fax: [phone]   RxID:   [id number]

## 2010-04-02 NOTE — Progress Notes (Signed)
Summary: NEED AUTHORIZATION/  Phone Note Call from Patient   Caller: Patient Call For: NADEL Summary of Call: NEED AUTHORIZATION FOR Cathy [name] TO [facility]  PHONE # [phone] PATIENT'S CHART HAS BEEN REQUESTED  Initial call taken by: Rickard Patience,  May 05, 2007 10:28 AM  Follow-up for Phone Call        This request is "in process" per [employer].  I offered to give pt. samples until we get an answer and pt. refused. Follow-up by: Vernie Murders,  May 05, 2007 3:00 PM         Appended Document: NEED AUTHORIZATION/ ins company denied generic protonix-pt must switch to omeprazole or nexium sn said change to nexium 40mg  1 once daily-pt aware and rx sent to [facility] [address]

## 2010-04-02 NOTE — Letter (Signed)
Summary: [facility]   Imported By: Lester Luther 11/11/2009 12:25:28  _____________________________________________________________________  External Attachment:    Type:   Image     Comment:   External Document

## 2010-04-02 NOTE — Procedures (Signed)
Summary: Upper Endoscopy  Patient: Cathy [name] Note: All result statuses are Final unless otherwise noted.  Tests: (1) Upper Endoscopy (EGD)   EGD Upper Endoscopy       DONE     [facility]     [address]     [place], [place]  [address]           ENDOSCOPY PROCEDURE REPORT           PATIENT:  Cathy, [name]  MR#:  [id number]     BIRTHDATE:  11-Mar-1927, 81 yrs. old  GENDER:  female           ENDOSCOPIST:  Hedwig Morton. Juanda Chance, MD     Referred by:  Alroy Dust, M.D.           PROCEDURE DATE:  07/30/2009     PROCEDURE:  EGD with dilatation over guidewire     ASA CLASS:  Class II     INDICATIONS:  dysphagia EGD 12/2001 14 mm stricture dilated     EGD 04/2009 nostricture? but 10 cm h hernia     now solid food dysphagia           MEDICATIONS:   Versed 3 mg, Fentanyl 25 mcg     TOPICAL ANESTHETIC:  Exactacain Spray           DESCRIPTION OF PROCEDURE:   After the risks benefits and     alternatives of the procedure were thoroughly explained, informed     consent was obtained.  The LB GIF-H180 [id number] endoscope was     introduced through the mouth and advanced to the second portion of     the duodenum, without limitations.  The instrument was slowly     withdrawn as the mucosa was fully examined.     <<PROCEDUREIMAGES>>           A hiatal hernia was found (see image1 and image4). large     nonreducible hiatal hernia 27-35 cm, eccentric lumen distal     esophagus with a nonobstructing stricture Savary dilation over a     guidewire 16,17,18 mm dilators, no blood on the dilator  Otherwise     the examination was normal (see image3 and image2).    Retroflexed     views revealed no abnormalities.    The scope was then withdrawn     from the patient and the procedure completed.           COMPLICATIONS:  None           ENDOSCOPIC IMPRESSION:     1) Hiatal hernia     2) Otherwise normal examination     s/p passage of large dilators x 3, the dysphagia is likely     caused by  displacement of gastric fundus into thoracic cavity     RECOMMENDATIONS:     await results of the dilations, if not improved, then, depending     on the degree of dysphagia, we would consider laparoscopic     reduction of the hernia. At this point her Sx's are not severe           REPEAT EXAM:  In 0 year(s) for.           ______________________________     Hedwig Morton. Juanda Chance, MD           CC:           n.     eSIGNED:   Hedwig Morton.  Silverio Hagan at 07/30/2009 08:48 AM           Marvel Plan, [id number]  Note: An exclamation mark (!) indicates a result that was not dispersed into the flowsheet. Document Creation Date: 07/30/2009 8:49 AM _______________________________________________________________________  (1) Order result status: Final Collection or observation date-time: 07/30/2009 08:37 Requested date-time:  Receipt date-time:  Reported date-time:  Referring Physician:   Ordering Physician: Lina Sar [phone]) Specimen Source:  Source: Launa Grill Order Number: [id number] Lab site:

## 2010-04-02 NOTE — Letter (Signed)
Summary: Colonoscopy Date Change Letter  [facility]  [address] [place], [place] [address]   Phone: [phone]  Fax: [phone]      October 14, 2009 MRN: [id number]   [facility] [address] [place], [place]  [address]   Dear Ms. Beedy,   Previously you were recommended to have a repeat colonoscopy around this time. Your chart was recently reviewed by Dr. Hedwig Morton. Juanda Chance of [facility]. Follow up colonoscopy is now recommended in Novemebr 2013. This revised recommendation is based on current, nationally recognized guidelines for colorectal cancer screening and polyp surveillance. These guidelines are endorsed by the American Cancer Society, [employer] on Colorectal Cancer as well as numerous other major medical organizations.  Please understand that our recommendation assumes that you do not have any new symptoms such as bleeding, a change in bowel habits, anemia, or significant abdominal discomfort. If you do have any concerning GI symptoms or want to discuss the guideline recommendations, please call to arrange an office visit at your earliest convenience. Otherwise we will keep you in our reminder system and contact you 1-2 months prior to the date listed above to schedule your next colonoscopy.  Thank you,  Hedwig Morton. Juanda Chance, M.D.  [facility] [phone]

## 2010-04-02 NOTE — Letter (Signed)
Summary: Surgical Clearance / [facility]  S[facility]ance / [facility]   Imported By: Lennie Odor 01/26/2010 11:55:17  _____________________________________________________________________  External Attachment:    Type:   Image     Comment:   External Document

## 2010-04-02 NOTE — Assessment & Plan Note (Signed)
Summary: 6 month return/mhh   Primary Care Provider:  Dr. Kriste Basque  CC:  6 month ROV & post hosp visit....  History of Present Illness: 77 y/o WF here for a follow up visit... she has multiple medical problems as noted below...     ~  Nov09:  she has mult arthritic complaints w/ back pain to left leg ("it's siatica"), left shoulder pain, etc... eval by Tora Perches w/ PT/ DCN100/ Pred trial... she wants second opinion from Rheum, and handicap sticker...   ~  May 22, 2008:  she saw DrRamos for L5 selective nerve root block but it didn't help her pain... she just saw DrDJones, Neurosurg for a second opinion- note pending... she reports that he wants to do a CT Myelogram and she is considering this... otherwise she is stable without new complainjts or concerns...  ~  May10:  S/P decompressive lumbar laminectomy at L2-3, L3-4, and L4-5 by DrJones, Neurosurg.   ~  November 19, 2008:  she was adm 7/10 w/ gastroent & dehydration- weak, FTT... CT showed GB inflamm w/ ductal dilatation & left adrenal adenoma... she was to ret 2 weeks post disch but cancelled & returns today- c/o LBP & left leg pain- eval by [facility] DrJones (MRI 8/10 showed s/p bilat laminectomies for decompression of the canal w/ signif sponylosis & foraminal stenosis)  w/ epid shot planned per DrBartko... she saw Urology DrTannenbaum 8/10 for hypotonic bladder, urge & stress incontinence- on Gelnique... we reviewed her pain med regimen per Neurosurg DrJones & f/u lab work today.   ~  May 20, 2009:  she was hosp 2/11 w/ chest & abd pain- cholecystitis & stones- s/p lap chole by DrHoxsworth... also had EGD- HH, and FlexSig- mild colitis... stable after surg w/ slow recovery & f/u DrPerry is pending...  her BP has been OK off all meds;  still on Simva40 for Chol but we don't have FLP on Rx to compare as yet;  Urology managed by DrTannenbaum- off Gelnique (it didn't help), using PT/ exercises to help w/ continence;  saw DrBartko for pain  management...     Current Problem List:  ALLERGY (ICD-995.3) - she uses ALLEGRA 180mg  daily and wants to continue w/ this therapy...  HYPERTENSION (ICD-401.9) - controlled & off LASIX now, taking ASA 81mg /d... "I love salt", not checking BP's at home... BP=132/80 feeling well & denies HA, visual symptoms, CP, palpit, SOB, edema, etc...  HYPERCHOLESTEROLEMIA (ICD-272.0) - on SIMVASTATIN 40mg /d now...  ~  FLP 4/08 showed TChol 218, TG 86, HDL 77, LDL 117  ~  FLP 2/09 showed TChol 198, TG 48, HDL 73, LDL 116  ~  3/10- she ret on diet alone- FLP w/ TChol 263, TG 65, HDL 81, LDL 158... rec> start Simva40.  ~  9/10: needs FLP but not fasting today... LFTs are WNL.  ~  3/11: reminded of need for f/u FLP on the Simva40.  HIATAL HERNIA (ICD-553.3) - on NEXIUM 40mg /d... last EGD was 11/03 by DrDBrodie and showed 5cm HH, & esoph stricture dilated... denies nausea, vomiting, heartburn, dysphagia, diarrhea, constipation, blood in stool, abdominal pain, swelling, gas, etc...  DIVERTICULOSIS OF COLON (ICD-562.10) & IRRITABLE BOWEL SYNDROME (ICD-564.1) -   ~  last colonoscopy was 11/03 by DrDBrodie & showed divertics only...  ~  9/10:  she notes some constipation & Questran discontinued.  UTI (ICD-599.0) - prev evals by GYN DrMcPhail, and Urology DrTannenbaum... she has frequency, urge & stress incontinence, and a hypotonic bladder- s/p pubovag sling surg  for incont... they tried Bethanachol (she stopped this med) & GELNIQUE (she states didn't help)...  ~  Urology f/u by DrTannenbaum 12/10 reviewed & stable w/ phys therapy.  HYSTERECTOMY, HX OF (ICD-V45.77) - GYN= DrMcPhail who still has her on Premarin 1.25mg /d...  DEGENERATIVE JOINT DISEASE (ICD-715.90) - prev on CELEBREX... followed by Tora Perches for Ortho & DrJones for Neurosurg.  BACK PAIN, LUMBAR (ICD-724.2) - DrBeane has evaluated and Rx w/ shots, Pred, DCN100... she has seen DrRamos & had selective L5 nerve root block (without benefit)... second  opinion from DrDJones, Neurosurg w/ lumbar laminectomy 5/10 providing temp relief...  ~  9/10: c/o recurrent LBP w/ MRI per DrJones and epid shot planned per DrBartko... now on FLECTOR Prn, NEURONTIN 100mg - 2Tid, VICODIN up to 3 per day...  ~  12/10: note from [facility] reviewed- pain management.  OSTEOPENIA (ICD-733.90) - she takes ALENDRONATE, Ca++, MVI, Vit D... last BMD was 6/08 at [facility] & showed TScores from -2.0 to -2.7, sl better than in 2005...  ~  Vit D level 2/09 was 30 (30-90) and rec to take Vit D 1000 u daily supplement...  ANXIETY (ICD-300.00)  ANEMIA OF CHRONIC DISEASE (ICD-285.29) - labs 2/09 showed Hg= 10.4 & MCV= 90... taking Feosol 200mg /d...  ~  labs 3/10 showed Hg= 12.9  ~  labs 9/10 showed Hg= 12.0, Fe= 89  ~  labs 3/11 showed Hg= 10.7, Fe= 55, TIBC= 119, Sat=35%...   Allergies: 1)  ! Sulfa  Comments:  Nurse/Medical Assistant: The patient's medications and allergies were reviewed with the patient and were updated in the Medication and Allergy Lists.  Past History:  Past Medical History:  ALLERGY (ICD-995.3) HYPERTENSION (ICD-401.9) HYPERCHOLESTEROLEMIA (ICD-272.0) HIATAL HERNIA (ICD-553.3) DIVERTICULOSIS OF COLON (ICD-562.10) IRRITABLE BOWEL SYNDROME (ICD-564.1) UTI (ICD-599.0) URINARY INCONTINENCE (ICD-788.30) DEGENERATIVE JOINT DISEASE (ICD-715.90) BACK PAIN, LUMBAR (ICD-724.2) OSTEOPENIA (ICD-733.90) ANXIETY (ICD-300.00) ANEMIA OF CHRONIC DISEASE (ICD-285.29)  Past Surgical History: S/P cataract surgery S/P hysterectomy S/P decompressive lumbar laminectomy at L2-3, L3-4, and L4-5 by DrJones 5/10 S/P lap chole 2/11 by DrHoxsworth  Family History: Reviewed history from 08/28/2008 and no changes required. Cancer- lung,  DM CAD Mother-CHF  Social History: Reviewed history from 08/28/2008 and no changes required. former smoker -quit age 56 smoked  ~10 yr 1/2 d.  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  The patient denies anorexia, fever, weight loss, weight gain, vision loss, hoarseness, chest pain, syncope, peripheral edema, prolonged cough, headaches, hemoptysis, abdominal pain, melena, hematochezia, severe indigestion/heartburn, hematuria, incontinence, suspicious skin lesions, transient blindness, depression, unusual weight change, abnormal bleeding, enlarged lymph nodes, and angioedema.    Vital Signs:  Patient profile:   77 year old female Height:      62 inches Weight:      127 pounds BMI:     23.31 O2 Sat:      96 % on Room air Temp:     97.8 degrees F oral Pulse rate:   70 / minute BP sitting:   132 / 80  (right arm) Cuff size:   regular  Vitals Entered By: Randell Loop CMA (May 20, 2009 1:58 PM)  O2 Sat at Rest %:  96 O2 Flow:  Room air CC: 6 month ROV & post hosp visit... Is Patient Diabetic? No Pain Assessment Patient in pain? no  Comments meds updated today   Physical Exam  Additional Exam:  WD, WN, 77 y/o WF in NAD... GENERAL:  Alert & oriented; pleasant & cooperative... HEENT:  [place]/AT,  EACs-clear, TMs-wnl, NOSE-clear, THROAT-clear & wnl. NECK:  Supple w/ fairROM; no JVD; normal carotid impulses w/o bruits; no thyromegaly or nodules palpated; no lymphadenopathy. CHEST:  Clear to P & A; without wheezes/ rales/ or rhonchi heard... HEART:  Regular Rhythm; without murmurs/ rubs/ or gallops detected... ABDOMEN:  Soft & sl tenderness, normal bowel sounds; no organomegaly or masses palpated...no guarding or rebound.  BACK:  s/p lumbar lam & scoliosis/ kyphosis EXT: without deformities, mild arthritic changes; no varicose veins/ +venous insuffic/ tr-1+ edema... NEURO:  CN's intact; no focal neuro deficits... DERM:  No lesions noted; no rash etc...    MISC. Report  Procedure date:  05/20/2009  Findings:      BMP (METABOL)   Sodium                     140 mEq/L                   135-145   Potassium                 4.5 mEq/L                   3.5-5.1   Chloride                  106 mEq/L                   96-112   Carbon Dioxide            30 mEq/L                    19-32   Glucose                   85 mg/dL                    57-84   BUN                       17 mg/dL                    6-96   Creatinine                1.1 mg/dL                   2.9-5.2   Calcium                   9.3 mg/dL                   8.4-13.2   GFR                       50.85 mL/min                >60  Hepatic/Liver Function Panel (HEPATIC)   Total Bilirubin           0.4 mg/dL                   4.4-0.1   Direct Bilirubin          0.1 mg/dL  0.0-0.3   Alkaline Phosphatase      68 U/L                      39-117   AST                       16 U/L                      0-37   ALT                       12 U/L                      0-35   Total Protein             6.8 g/dL                    1.6-1.0   Albumin                   3.6 g/dL                    9.6-0.4  CBC Platelet w/Diff (CBCD)   White Cell Count          8.7 K/uL                    4.5-10.5   Red Cell Count       [L]  3.50 Mil/uL                 3.87-5.11   Hemoglobin           [L]  10.7 g/dL                   54.0-98.1   Hematocrit           [L]  32.2 %                      36.0-46.0   MCV                       92.0 fl                     78.0-100.0   Platelet Count            221.0 K/uL                  150.0-400.0   Neutrophil %              71.7 %                      43.0-77.0   Lymphocyte %              18.0 %                      12.0-46.0   Monocyte %                7.9 %                       3.0-12.0   Eosinophils%              1.9 %  0.0-5.0   Basophils %               0.5 %                       0.0-3.0  Comments:      TSH (TSH)   FastTSH                   1.66 uIU/mL                 0.35-5.50   IBC Panel (IBC)   Iron                       55 ug/dL                    28-413   Transferrin          [L]  119.0 mg/dL                 244.0-102.7   Iron Saturation           33.0 %                      20.0-50.0   Impression & Recommendations:  Problem # 1:  S/P Cholecystectomy for stones>>> stable- followed by GI & CCS...  Problem # 2:  HYPERTENSION (ICD-401.9) Off meds at present & BP stable... we will continue to monitor... The following medications were removed from the medication list:    Furosemide 20 Mg Tabs (Furosemide) .[name]... 1 by mouth once daily as needed swelling  Orders: TLB-BMP (Basic Metabolic Panel-BMET) (80048-METABOL) TLB-Hepatic/Liver Function Pnl (80076-HEPATIC) TLB-CBC Platelet - w/Differential (85025-CBCD) TLB-TSH (Thyroid Stimulating Hormone) (84443-TSH) TLB-IBC Pnl (Iron/FE;Transferrin) (83550-IBC)  Problem # 3:  HYPERCHOLESTEROLEMIA (ICD-272.0) She's been on Simva40 for 42mo but no f/u FLP as yet to test efficacy... LFT's OK so tol well... we discussed ret for FLP. Her updated medication list for this problem includes:    Simvastatin 40 Mg Tabs (Simvastatin) .[name]... Take 1 tab by mouth at bedtime  Problem # 4:  DIVERTICULOSIS OF COLON (ICD-562.10) GI appears stable s/p GB surg & she has f/u soon...  Problem # 5:  BACK PAIN, LUMBAR (ICD-724.2) She has DJD, LBP & followed by Ortho, Neurosurg & DrBartko for pain management... Her updated medication list for this problem includes:    Aspirin Adult Low Strength 81 Mg Tbec (Aspirin) .[name]... Take 1 tablet by mouth daily    Hydrocodone-acetaminophen 5-500 Mg Tabs (Hydrocodone-acetaminophen) .[name]... Take 1 tab by mouth every 6 h as needed for pain...    Celebrex 200 Mg Caps (Celecoxib) .[name]... 1 by mouth once daily as needed joint pain  Problem # 6:  OTHER MEDICAL PROBLEMS AS NOTED>>>  Complete Medication List: 1)  Allegra 180 Mg Tabs (Fexofenadine hcl) .... Take 1 tab by mouth once daily as needed for allergies 2)  Aspirin Adult Low Strength 81 Mg  Tbec (Aspirin) .... Take 1 tablet by mouth daily 3)  Simvastatin 40 Mg Tabs (Simvastatin) .... Take 1 tab by mouth at bedtime 4)  Nexium 40 Mg Cpdr (Esomeprazole magnesium) .... Take 1 cap by mouth once daily.Marland KitchenMarland Kitchen 5)  Hydrocodone-acetaminophen 5-500 Mg Tabs (Hydrocodone-acetaminophen) .... Take 1 tab by mouth every 6 h as needed for pain.Marland KitchenMarland Kitchen 6)  Neurontin 100 Mg Caps (Gabapentin) .... Take 2  tabs  by mouth three times a day... 7)  Flector 1.3 % Ptch (Diclofenac epolamine) .Marland KitchenMarland KitchenMarland Kitchen  Apply as directed by drbartko... 8)  Celebrex 200 Mg Caps (Celecoxib) .[name].. 1 by mouth once daily as needed joint pain 9)  Alendronate Sodium 70 Mg Tabs (Alendronate sodium) .... Take 1 tablet by mouth once a week 10)  Oscal 500/200 D-3 500-200 Mg-unit Tabs (Calcium-vitamin d) .... Take 1 tablet by mouth once a day 11)  Vitamin D 1000 Unit Caps (Cholecalciferol) .[name].. 1 by mouth once daily 12)  Feosol 200 Mg Tabs (Ferrous sulfate) .[name].. 1 by mouth once daily  Other Orders: Prescription Created Electronically [phone])  Patient Instructions: 1)  Today we updated your med list- see below.... 2)  We refilled your Neurontin for 6/d for a 13mo supply... 3)  Today we did your follow up blood work... please call the "phone tree" in a few days for your lab results.Marland KitchenMarland Kitchen 4)  Call for any questions.Marland KitchenMarland Kitchen 5)  Please schedule a follow-up appointment in 3 months. Prescriptions: NEURONTIN 100 MG CAPS (GABAPENTIN) take 2  tabs  by mouth three times a day...  #540 x 3   Entered and Authorized by:   Michele Mcalpine MD   Signed by:   Michele Mcalpine MD on 05/20/2009   Method used:   Print then Give to Patient   RxID:   [id number]

## 2010-04-02 NOTE — Letter (Signed)
Summary: New Patient letter  [facility]  [address] [place], [place] [address]   Phone: [phone]  Fax: [phone]       05/02/2009 MRN: [id number]  [facility] [address] [place], [place]  [address]  Dear Ms. Manzo,  Welcome to the Gastroenterology Division at [facility].    You are scheduled to see Willette Cluster, NP on 05/26/2009 at 1:30pm on the 3rd floor at [facility], [address]  We ask that you try to arrive at our office 15 minutes prior to your appointment time to allow for check-in.  We would like you to complete the enclosed self-administered evaluation form prior to your visit and bring it with you on the day of your appointment.  We will review it with you.  Also, please bring a complete list of all your medications or, if you prefer, bring the medication bottles and we will list them.  Please bring your insurance card so that we may make a copy of it.  If your insurance requires a referral to see a specialist, please bring your referral form from your primary care physician.  Co-payments are due at the time of your visit and may be paid by cash, check or credit card.     Your office visit will consist of a consult with your physician (includes a physical exam), any laboratory testing he/she may order, scheduling of any necessary diagnostic testing (e.g. x-ray, ultrasound, CT-scan), and scheduling of a procedure (e.g. Endoscopy, Colonoscopy) if required.  Please allow enough time on your schedule to allow for any/all of these possibilities.    If you cannot keep your appointment, please call [phone] to cancel or reschedule prior to your appointment date.  This allows [place] the opportunity to schedule an appointment for another patient in need of care.  If you do not cancel or reschedule by 5 p.m. the business day prior to your appointment date, you will be charged a $50.00 late cancellation/no-show fee.    Thank you for  choosing [facility] for your medical needs.  We appreciate the opportunity to care for you.  Please visit [place] at our website  to learn more about our practice.                     Sincerely,                                                             The Gastroenterology Division

## 2010-04-02 NOTE — Progress Notes (Signed)
Summary: cough  Phone Note Call from Patient Call back at [facility] Phone [phone]   Caller: Patient Call For: nadel Reason for Call: Talk to Nurse Summary of Call: coughing up yellow green plegm, blowing out same.  Congestion in head.  Taking Mucinex and Delsum. [facility] [address] Initial call taken by: Eugene Gavia,  August 05, 2009 1:15 PM  Follow-up for Phone Call        scheduled pt to see tp 6/8 at 11am--pt refused hp for 6/7 Follow-up by: Philipp Deputy CMA,  August 05, 2009 2:06 PM

## 2010-04-02 NOTE — Assessment & Plan Note (Signed)
Summary: hosptial follow up/lk   History of Present Illness Visit Type: Initial Visit Primary GI MD: Lina Sar MD Primary Provider: Alroy Dust, MD Chief Complaint: Patient post hospital follow up. Patient states that her diarrhea has resolved. Patient has alot of questions about her colitis.  History of Present Illness:   Patient last seen 2003 for anemia workup. She has chronic anemia, GERD, dyslipidemia.She recently went to ER for chest pain. Cardiac workup negative. Ended up with cholecystectomy. She had been having diarrhea for weeks, GI was called. Dr. Elnoria Howard was covering for our group and saw her in consult. Empirically treated for C-Diff but specimens negative x4. Flexible sigmoidoscopy done. Random biopsies compatible with lymphocytic colitis. Given ten days of Entocort.  BMs are now normal.   GERD: Doing well on daily Nexium.    GI Review of Systems    Reports acid reflux.      Denies abdominal pain, belching, bloating, chest pain, dysphagia with liquids, dysphagia with solids, heartburn, loss of appetite, nausea, vomiting, vomiting blood, weight loss, and  weight gain.      Reports change in bowel habits and  diarrhea.     Denies anal fissure, black tarry stools, constipation, diverticulosis, fecal incontinence, heme positive stool, hemorrhoids, irritable bowel syndrome, jaundice, light color stool, liver problems, rectal bleeding, and  rectal pain.    Current Medications (verified): 1)  Allegra 180 Mg Tabs (Fexofenadine Hcl) .... Take 1 Tab By Mouth Once Daily As Needed For Allergies 2)  Aspirin Adult Low Strength 81 Mg Tbec (Aspirin) .... Take 1 Tablet By Mouth Daily 3)  Simvastatin 40 Mg Tabs (Simvastatin) .... Take 1 Tab By Mouth At Bedtime 4)  Nexium 40 Mg  Cpdr (Esomeprazole Magnesium) .... Take 1 Cap By Mouth Once Daily.Marland KitchenMarland Kitchen 5)  Hydrocodone-Acetaminophen 5-500 Mg Tabs (Hydrocodone-Acetaminophen) .... Take 1 Tab By Mouth Every 6 H As Needed For Pain... 6)  Neurontin 100 Mg  Caps (Gabapentin) .... Take 2  Tabs  By Mouth Three Times A Day... 7)  Flector 1.3 % Ptch (Diclofenac Epolamine) .... Apply As Directed By Drbartko... 8)  Celebrex 200 Mg Caps (Celecoxib) .[name].. 1 By Mouth Once Daily As Needed Joint Pain 9)  Alendronate Sodium 70 Mg Tabs (Alendronate Sodium) .... Take 1 Tablet By Mouth Once A Week 10)  Oscal 500/200 D-3 500-200 Mg-Unit Tabs (Calcium-Vitamin D) .... Take 1 Tablet By Mouth Once A Day 11)  Vitamin D 1000 Unit  Caps (Cholecalciferol) .[name].. 1 By Mouth Once Daily 12)  Feosol 200 Mg  Tabs (Ferrous Sulfate) .[name].. 1 By Mouth Once Daily  Allergies (verified): 1)  ! Sulfa  Past History:  Past Medical History: Current Problems:  LYMPHOCYTIC COLITIS (ICD-558.9) VENOUS INSUFFICIENCY (ICD-459.81) ALLERGY (ICD-995.3) HYPERTENSION (ICD-401.9) HYPERCHOLESTEROLEMIA (ICD-272.0) HIATAL HERNIA (ICD-553.3) DIVERTICULOSIS OF COLON (ICD-562.10) IRRITABLE BOWEL SYNDROME (ICD-564.1) UTI (ICD-599.0) URINARY INCONTINENCE (ICD-788.30) DEGENERATIVE JOINT DISEASE (ICD-715.90) BACK PAIN, LUMBAR (ICD-724.2) OSTEOPENIA (ICD-733.90) ANXIETY (ICD-300.00) ANEMIA OF CHRONIC DISEASE (ICD-285.29)  Past Surgical History: Reviewed history from 05/26/2009 and no changes required. S/P cataract surgery S/P hysterectomy S/P decompressive lumbar laminectomy at L2-3, L3-4, and L4-5 by DrJones 5/10 S/P lap chole 2/11 by DrHoxsworth  Family History: Cancer- lung daughter, brother DM - son CAD - son, mother Mother-CHF Family History of Colon Cancer: brother cervical cancer: daughter  Social History: former smoker -quit age 19 smoked  ~10 yr 1/2 d.  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  The patient denies anemia, anxiety-new, blood in urine, breast changes/lumps, change in vision, confusion, cough, coughing up  blood, depression-new, fainting, fatigue, fever, headaches-new, hearing problems, heart murmur, heart rhythm changes, itching, menstrual pain, muscle pains/cramps, night sweats, nosebleeds, pregnancy symptoms, shortness of breath, skin rash, sleeping problems, sore throat, swelling of feet/legs, swollen lymph glands, thirst - excessive , urination - excessive , urination changes/pain, vision changes, and voice change.    Vital Signs:  Patient profile:   77 year old female Height:      59 inches Weight:      128.8 pounds BMI:     26.11 Pulse rate:   72 / minute Pulse rhythm:   regular BP sitting:   130 / 64  (left arm) Cuff size:   regular  Vitals Entered By: Harlow Mares CMA Duncan Dull) (May 26, 2009 1:21 PM)  Physical Exam  General:  Well developed, well nourished, no acute distress. Head:  Normocephalic and atraumatic. Eyes:  Conjunctiva pink, no icterus.  Neck:  no obvious masses  Lungs:  Clear throughout to auscultation. Heart:  RRR Abdomen:  Abdomen soft, nontender, nondistended. No obvious masses or hepatomegaly.Normal bowel sounds.  Neurologic:  Alert and  oriented x4;  grossly normal neurologically. Skin:  Intact without significant lesions or rashes. Psych:  Alert and cooperative. Normal mood and affect.   Impression & Recommendations:  Problem # 1:  COLITIS - LYMPHOCYTIC COLITIS FEB. 2011 (ICD-558.9) Assessment Improved Feb. 2011. Diarrhea resolved after only ten days of Entocort. We discussed lymphocytic colitis and the possibility that it could come back in the future. For now she looks and feels good.   Problem # 2:  FAMILY HX COLON CANCER (ICD-V16.0) Assessment: Comment Only Patient had a flexible sigmoidoscopy (prep poor, colon couldn't be done) in 2003. Her brother currently has colon cancer. Ideally patient needs colonoscopy but she is just getting over her recent hospitalization. Will defer to Dr. Juanda Chance as to whether she wants to pursue colonoscopy at some  point in the near future.  Problem # 3:  ANEMIA-UNSPECIFIED (ICD-285.9) Assessment: Comment Only Chronic. Stable. Hgb stays 10-11 on average. No overt bleeding.   Patient Instructions: 1)  We will call you with next Colonoscopy date and instructions.  Appended Document: hosptial follow up/lk chart reviewed, difficult colon exam 2003 to hepatic flexure ( tortuous colon and poor prep). I suggest for her to see me in 2 months to discuss her condition ( colitis, etc)  Appended Document: hosptial follow up/lk Patient has been scheduled for a return office visit on 07/29/09 at 1:45 pm. She has been advised of this time and date and agrees to come see Dr Juanda Chance to discuss her condition before deciding further disposition.

## 2010-04-02 NOTE — Letter (Signed)
Summary: EGD Instructions  [facility]  [address] [place], [place] [address]   Phone: [phone]  Fax: [phone]       Cathy [name]    December 19, 1927    MRN: [id number]       Procedure Day /Date: 07/30/09 Wednesday     Arrival Time: 7:30 am     Procedure Time: 8:00 am     Location of Procedure:                    _ x _ [facility] (4th Floor)  PREPARATION FOR ENDOSCOPY   On 07/30/09 THE DAY OF THE PROCEDURE:  1.   No solid foods, milk or milk products are allowed after midnight the night before your procedure.  2.   Do not drink anything colored red or purple.  Avoid juices with pulp.  No orange juice.  3.  You may drink clear liquids until 6:00 am, which is 2 hours before your procedure.                                                                                                CLEAR LIQUIDS INCLUDE: Water Jello Ice Popsicles Tea (sugar ok, no milk/cream) Powdered fruit flavored drinks Coffee (sugar ok, no milk/cream) Gatorade Juice: apple, white grape, white cranberry  Lemonade Clear bullion, consomm, broth Carbonated beverages (any kind) Strained chicken noodle soup Hard Candy   MEDICATION INSTRUCTIONS  Unless otherwise instructed, you should take regular prescription medications with a small sip of water as early as possible the morning of your procedure.                 OTHER INSTRUCTIONS  You will need a responsible adult at least 75 years of age to accompany you and drive you home.   This person must remain in the waiting room during your procedure.  Wear loose fitting clothing that is easily removed.  Leave jewelry and other valuables at home.  However, you may wish to bring a book to read or an iPod/MP3 player to listen to music as you wait for your procedure to start.  Remove all body piercing jewelry and leave at home.  Total time from sign-in until discharge is approximately 2-3 hours.  You should go home  directly after your procedure and rest.  You can resume normal activities the day after your procedure.  The day of your procedure you should not:   Drive   Make legal decisions   Operate machinery   Drink alcohol   Return to work  You will receive specific instructions about eating, activities and medications before you leave.    The above instructions have been reviewed and explained to me by Lamona Curl CMA Duncan Dull)  Jul 29, 2009 8:43 AM     I fully understand and can verbalize these instructions _____________________________ Date 07/29/09

## 2010-04-02 NOTE — Consult Note (Signed)
Summary: C DIFF (Dr Elnoria Howard)  NAME:  Cathy [name], Cathy [name]                 ACCOUNT NO.:  [id number]      MEDICAL RECORD NO.:  [id number]          PATIENT TYPE:  INP      LOCATION:  5149                         FACILITY:  [facility]      PHYSICIAN:  Jordan Hawks. Elnoria Howard, MD    DATE OF BIRTH:  Sep 04, 1927      DATE OF CONSULTATION:  04/19/2009   DATE OF DISCHARGE:                                    CONSULTATION      HISTORY OF PRESENT ILLNESS:  This is a 77 year old female with a past   medical history of coronary artery disease, recurrent UTI, and   pyelonephritis, who is admitted to the hospital with complaints of chest   pain.  She stated that she woke up at 3:30 in the morning on Friday and   it was quite severe, is in the right upper quadrant, and it radiated to   the right scapula.  In July 2010, she was admitted at that time for   complaints of diarrhea and she was empirically treated for C. diff   despite negative C. diff assays.  She subsequently improved without   treatment, but during the evaluation, she had complained of abdominal   pain.  CT scan was performed and showed some mild inflammation around   the gallbladder, but no evidence of any stones.  She did have   extrahepatic and intrahepatic biliary ductal dilation.  The patient did   improve at that time and she was discharged.  Currently, she states   having similar type of episode compared to the last admission in regards   to her diarrhea, but at that time, she did not complain of having any   type of right upper quadrant abdominal pain or any chest pain.  During   this hospitalization, a right upper quadrant ultrasound was performed   and it was confirmatory for the ductal dilation with a CBD size of 1.4   cm.  She has also ruled out for myocardial infarction and her liver   enzymes are normal.  She does report that pain does worsen with deep   inspiration, but it does not appear to be associated with any p.o.   intake.  Her diarrhea is  also another symptom that she has at this time   and it started 2 weeks ago.  She is unable to quantify how many bowel   movements she has a day, but she reports having with a dark appearance.   Hemoccult was performed and it was noted that she was heme positive.   Over the course of since November 2010, her hemoglobin was noted to be   dressing from 13 down to current level of 9.  She denies any vomiting or   hematemesis.  There is a mild report of nausea.  She was treated by Dr.   Kriste Basque for her diarrhea and did improve, but she was concerned that she   may have an impaction which occurred previously.  Subsequently, she   stopped using  the antidiarrheals.  Subsequently, she was started on   Cipro and Flagyl, but does not appear to be of any significant benefit   at this time.  As a result of her anemia, GI consultation was requested   for further evaluation and treatment.      PAST MEDICAL AND SURGICAL HISTORY:  As stated above.      FAMILY HISTORY:  Noncontributory.      ALLERGIES:  Augmentin and sulfa.      SOCIAL HISTORY:  Negative for alcohol, tobacco, or illicit drug use.      MEDICATIONS:   1. Ceftriaxone 1 g IV daily.   2. Lovenox 40 mg subcu daily.   3. Flagyl 500 mg IV q.8 h.   4. Protonix 40 mg p.o. daily.   5. Bisacodyl 10 mg p.r.n.   6. Zofran 4 mg IV q.4 h. p.r.n.   7. Senokot 2 tablets p.o. daily p.r.n.      PHYSICAL EXAMINATION:  VITAL SIGNS:  Blood pressure is 135/69, heart   rate is 77, respirations 18, temperature is 100.   GENERAL:  The patient is in no acute distress, alert and oriented.   HEENT:  Normocephalic, atraumatic.  Extraocular muscles intact.   NECK:  Supple.  No lymphadenopathy.   LUNGS:  Clear to auscultation bilaterally.   CARDIOVASCULAR:  Regular rate and rhythm.   ABDOMEN:  Mildly obese, soft, tenderness with palpation of the right   upper quadrant, but there is also tenderness in the right costal region   and there is tenderness with  palpation in the scapular region which   appears to be musculoskeletal.   EXTREMITIES:  No clubbing, cyanosis, or edema.      LABORATORY VALUES:  White blood cell count is 14.3, hemoglobin 9.3,   platelets at 193.  Sodium 139, potassium 3.8, chloride 108, CO2 24,   glucose 90, BUN 11, creatinine 0.9.  Total bilirubin 0.4, alk phos 59,   AST 14, ALT 13, albumin is 2.5.  Amylase is 23, lipase is 17.   Urinalysis reveals small amount of bilirubin and heme-positive stool.      IMPRESSION:   1. Anemia.   2. Heme-positive stool.   3. Right upper quadrant pain, questionable musculoskeletal in       etiology.   4. Diarrhea.  It is very difficult to discern the etiology of the       patient's symptoms.  In regards to just the anemia, I have reviewed       her hemoglobin since 2009, and it appears that her average       hemoglobin is approximately 10 of late within the past several       months, it was non-detained.  In November, there was documentation       that it was in the 13 range.  However, I believe that this may be       an erosions value.  She is found to have heme-positive stool and it       is not unreasonable to evaluate her with an EGD.  With abdominal       pain, I cannot identify the exact cause.  I have suspicion that is       a musculoskeletal source as it does hurt with deep inspiration and       direct palpation on the ribs, although there is some pain within       the right upper quadrant region.  She has  evidence of dilated       intrahepatic and extrahepatic ducts.  No evidence of any stones,       and a CT scan in July 2010, revealed that there is some mild       inflammation around the gallbladder, but no further intervention       was performed at that time.  Currently, her white blood cell count       is mildly elevated and she also has a low-grade fever at 100.  She       also has diarrhea which started 2 weeks ago.  Again, it is       undetermined etiology.  She  was previously treated empirically with       Flagyl for a possibility of C. diff which did resolve at that time,       although she does not appear to be having any significant effect       currently and C. diff studies are pending at this time.      PLAN:   1. An EGD/flexible sigmoidoscopy tomorrow to evaluate the anemia and       diarrhea.   2. We will continue the antibiotics for the time being.   3. An EUS can be performed at a later date in regards to further       evaluation of the dilated intrahepatic and extrahepatic duct and       there may be an occult stone, although I find this unlikely in that       her transaminases are completely normal.               Jordan Hawks. Elnoria Howard, MD         PDH/MEDQ  D:  04/19/2009  T:  04/20/2009  Job:  [id number]      cc:   Hedwig Morton. Juanda Chance, MD   Lonzo Cloud. Kriste Basque, MD

## 2010-04-02 NOTE — Letter (Signed)
Summary: [facility]   Imported By: Sherian Rein 10/01/2009 08:37:19  _____________________________________________________________________  External Attachment:    Type:   Image     Comment:   External Document

## 2010-04-02 NOTE — Progress Notes (Signed)
Summary: diarrhea  Phone Note Call from Patient   Caller: Patient Call For: parrett Summary of Call: pt unable to take augmetin . it causes diarrhea have not taken this am Initial call taken by: Rickard Patience,  August 08, 2009 9:45 AM  Follow-up for Phone Call        Pt has taken 4 tablets of Augmentin course. LAst night she states she was woken up with diarrhea and thinks it may be the medication. She states she was taking it with food. Pt thinks this happened inthe past when she took augmentin before. I have added augmentin to pt allergy list.  Please advise.Carron Curie CMA  August 08, 2009 9:56 AM allergies: sulfa [facility] [address]  Additional Follow-up for Phone Call Additional follow up Details #1::        per sn gen. flagyl 250mg  1 by mouth three times a day #21 Additional Follow-up by: Philipp Deputy CMA,  August 08, 2009 12:15 PM   New Allergies: ! AUGMENTIN Additional Follow-up for Phone Call Additional follow up Details #2::    Spoke with pt.  Pt informed SN would like to chagne augmentin to generic flagyl 1 by mouth three times a day.  She verbalized understanding and aware rx sent to [facility].  Crystal Jones RN  August 08, 2009 12:22 PM   New/Updated Medications: METRONIDAZOLE 250 MG TABS (METRONIDAZOLE) take 1 by mouth three times a day New Allergies: ! AUGMENTINPrescriptions: METRONIDAZOLE 250 MG TABS (METRONIDAZOLE) take 1 by mouth three times a day  #21 x 0   Entered by:   Gweneth Dimitri RN   Authorized by:   Michele Mcalpine MD   Signed by:   Gweneth Dimitri RN on 08/08/2009   Method used:   Electronically to        [facility] [phone]* (retail)       [address] [place], [place]  [address]       Ph: [phone] or [phone]       Fax: [phone]   RxID:   [id number]

## 2010-04-02 NOTE — Assessment & Plan Note (Signed)
Summary: F/U after visit w/paula on 05/26/09.Marland Kitchendn   History of Present Illness Visit Type: Follow-up Visit Primary GI MD: Lina Sar MD Primary Provider: Alroy Dust, MD Requesting Provider: na Chief Complaint: Colitis f/u. Pt c/o of choking on food when she swallows History of Present Illness:   This is a 77 year old white female with solid food dysphagia which occurs several times a week. An upper endoscopy completed 3 months ago by Dr Elnoria Howard  showed a 10 cm hiatal hernia. She was then hospitalized for diarrhea. She is post laparoscopic cholecystectomy. A flexible sigmoidoscopy showed lymphocytic colitis. Patient was treated with Entocort and has  had complete resolution of her symptoms. She currently  denies any diarrhea, rectal bleeding or bowel problems. She has  a positive family history of colon cancer in her brother. Her last full colonoscopy was in 2003. She has a history of hyperlipidemia and anemia.   GI Review of Systems      Denies abdominal pain, acid reflux, belching, bloating, chest pain, dysphagia with liquids, dysphagia with solids, heartburn, loss of appetite, nausea, vomiting, vomiting blood, weight loss, and  weight gain.        Denies anal fissure, black tarry stools, change in bowel habit, constipation, diarrhea, diverticulosis, fecal incontinence, heme positive stool, hemorrhoids, irritable bowel syndrome, jaundice, light color stool, liver problems, rectal bleeding, and  rectal pain.    Current Medications (verified): 1)  Allegra 180 Mg Tabs (Fexofenadine Hcl) .... Take 1 Tab By Mouth Once Daily As Needed For Allergies 2)  Aspirin Adult Low Strength 81 Mg Tbec (Aspirin) .... Take 1 Tablet By Mouth Daily 3)  Simvastatin 40 Mg Tabs (Simvastatin) .... Take 1 Tab By Mouth At Bedtime 4)  Nexium 40 Mg  Cpdr (Esomeprazole Magnesium) .... Take 1 Cap By Mouth Once Daily.Marland KitchenMarland Kitchen 5)  Hydrocodone-Acetaminophen 5-500 Mg Tabs (Hydrocodone-Acetaminophen) .... Take 1 Tab By Mouth Every 6 H  As Needed For Pain... 6)  Neurontin 100 Mg Caps (Gabapentin) .... Take 2  Tabs  By Mouth Three Times A Day... 7)  Flector 1.3 % Ptch (Diclofenac Epolamine) .... Apply As Directed By Drbartko... 8)  Celebrex 200 Mg Caps (Celecoxib) .[name].. 1 By Mouth Once Daily As Needed Joint Pain 9)  Alendronate Sodium 70 Mg Tabs (Alendronate Sodium) .... Take 1 Tablet By Mouth Once A Week 10)  Oscal 500/200 D-3 500-200 Mg-Unit Tabs (Calcium-Vitamin D) .... Take 1 Tablet By Mouth Once A Day 11)  Vitamin D 1000 Unit  Caps (Cholecalciferol) .[name].. 1 By Mouth Once Daily 12)  Feosol 200 Mg  Tabs (Ferrous Sulfate) .[name].. 1 By Mouth Once Daily  Allergies (verified): 1)  ! Sulfa  Past History:  Past Medical History: Reviewed history from 05/26/2009 and no changes required. Current Problems:  LYMPHOCYTIC COLITIS (ICD-558.9) VENOUS INSUFFICIENCY (ICD-459.81) ALLERGY (ICD-995.3) HYPERTENSION (ICD-401.9) HYPERCHOLESTEROLEMIA (ICD-272.0) HIATAL HERNIA (ICD-553.3) DIVERTICULOSIS OF COLON (ICD-562.10) IRRITABLE BOWEL SYNDROME (ICD-564.1) UTI (ICD-599.0) URINARY INCONTINENCE (ICD-788.30) DEGENERATIVE JOINT DISEASE (ICD-715.90) BACK PAIN, LUMBAR (ICD-724.2) OSTEOPENIA (ICD-733.90) ANXIETY (ICD-300.00) ANEMIA OF CHRONIC DISEASE (ICD-285.29)  Past Surgical History: Reviewed history from 05/26/2009 and no changes required. S/P cataract surgery S/P hysterectomy S/P decompressive lumbar laminectomy at L2-3, L3-4, and L4-5 by DrJones 5/10 S/P lap chole 2/11 by DrHoxsworth  Family History: Cancer- lung daughter, brother DM - son CAD - son, mother Mother-CHF Family History of Colon Cancer: brother cervical cancer: daughter No FH of Colon Cancer:  Social History: Reviewed history from 05/26/2009 and no changes required. former smoker -quit age 77 smoked  ~10  yr 1/2 d.  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 Daily Caffeine Use one per day  Review of Systems  The patient denies allergy/sinus, anemia,  anxiety-new, arthritis/joint pain, back pain, blood in urine, breast changes/lumps, change in vision, confusion, cough, coughing up blood, depression-new, fainting, fatigue, fever, headaches-new, hearing problems, heart murmur, heart rhythm changes, itching, menstrual pain, muscle pains/cramps, night sweats, nosebleeds, pregnancy symptoms, shortness of breath, skin rash, sleeping problems, sore throat, swelling of feet/legs, swollen lymph glands, thirst - excessive , urination - excessive , urination changes/pain, urine leakage, vision changes, and voice change.         Pertinent positive and negative review of systems were noted in the above HPI. All other ROS was otherwise negative.   Vital Signs:  Patient profile:   77 year old female Height:      59 inches Weight:      129 pounds BMI:     26.15 BSA:     1.53 Pulse rate:   68 / minute Pulse rhythm:   regular BP sitting:   132 / 80  (left arm) Cuff size:   regular  Vitals Entered By: Ok Anis CMA (Jul 29, 2009 8:19 AM)  Physical Exam  General:  Well developed, well nourished, no acute distress. Normal voice with no cough. Mouth:  No deformity or lesions, dentition normal. Neck:  Supple; no masses or thyromegaly. Chest Wall:  prominent thoracic kyphosis. Lungs:  Clear throughout to auscultation. Heart:  Regular rate and rhythm; no murmurs, rubs,  or bruits. Abdomen:  post laparoscopic cholecystectomy scars. Minimal tenderness in epigastrium. Normoactive bowel sounds. Extremities:  trace edema. Skin:  Intact without significant lesions or rashes. Psych:  Alert and cooperative. Normal mood and affect.   Impression & Recommendations:  Problem # 1:  HIATAL HERNIA (ICD-553.3) Patient has new-onset solid food dysphagia consistent with an esophageal stricture. She has a history of esophageal stricture on an upper endoscopy in November 2003. She also had a 5 cm  hiatal hernia. She is status post remote dilatation of the stricture in  2003 with a 44 [place] Maloney dilator. We will schedule her for a repeat upper endoscopy and possible dilation. She is to continue Nexium 40 mg daily as well as antireflux measures. We discussed problems with her hiatal hernia and that she needs to elevate the head of the bed at night. Her kyphosis is likely contributing to increased size of the hiatal hernia.  Problem # 2:  COLITIS - LYMPHOCYTIC COLITIS FEB. 2011 (ICD-558.9) Symptoms have now resolved.  Problem # 3:  FAMILY HX COLON CANCER (ICD-V16.0) Patient had a colonoscopy last in 2003. She will have a repeat colonoscopy in the Fall of 2011.  Other Orders: EGD SAV (EGD SAV)  Patient Instructions: 1)  Upper endoscopy with dilatation. 2)  Continue Nexium 40 mg daily. 3)  Recall colonoscopy in September of 2011. 4)  Antireflux measures. 5)  Copy sent to : Dr Jodelle Green 6)  The medication list was reviewed and reconciled.  All changed / newly prescribed medications were explained.  A complete medication list was provided to the patient / caregiver.

## 2010-04-02 NOTE — Progress Notes (Signed)
Summary: SINUS INFECTION  Phone Note Call from Patient   Caller: Patient Call For: Hau Sanor Summary of Call: SINUS INFECTION [facility] [address] Initial call taken by: Rickard Patience,  March 26, 2009 11:59 AM  Follow-up for Phone Call        Pt c/o productive cough with green mucus x 1 day, right earache using Auragan Solution one drop twice a day, and facial pressure with pain. Pt denies fever, S.O.B, wheezing, chest tightness, and headaches. Pt has tried OTC Delsym 1 tablespoon every 12 hours, Mucinex DM every 12 hours, Ocean Nasal Spray one spray each nostril. Please advise. Thank you. Zackery Barefoot CMA  March 26, 2009 12:09 PM   Additional Follow-up for Phone Call Additional follow up Details #1::        per SN----change the mucinex to mucinex max 1 by mouth two times  a day with plenty of fluids and rx for augmentin 875mg   #14 1 by mouth two times a day ---IF NOT PCN ALLERGIC...thanks Randell Loop CMA  March 26, 2009 2:53 PM   rx sent. pt aware of recs.Carron Curie CMA  March 26, 2009 2:59 PM     New/Updated Medications: AUGMENTIN 875-125 MG TABS (AMOXICILLIN-POT CLAVULANATE) Take 1 tablet by mouth two times a day Prescriptions: AUGMENTIN 875-125 MG TABS (AMOXICILLIN-POT CLAVULANATE) Take 1 tablet by mouth two times a day  #14 x 0   Entered by:   Carron Curie CMA   Authorized by:   Michele Mcalpine MD   Signed by:   Carron Curie CMA on 03/26/2009   Method used:   Electronically to        [facility] [phone]* (retail)       [address] [place], [place]  [address]       Ph: [phone] or [phone]       Fax: [phone]   RxID:   [id number]

## 2010-04-02 NOTE — Letter (Signed)
Summary: [facility]   Imported By: Sherian Rein 06/04/2009 08:01:21  _____________________________________________________________________  External Attachment:    Type:   Image     Comment:   External Document

## 2010-04-02 NOTE — Procedures (Signed)
Summary: EGD   EGD  Procedure date:  04/20/2009  Findings:      Location: [facility]   OPERATIVE PROCEDURE REPORT  PATIENT: Cathy [name], Cathy [name] MR#: [id number] BIRTHDATE:  February 07, 1928  GENDER:  female ENDOSCOPIST:  Jeani Hawking, MD PROCEDURE DATE:  04/20/2009 PROCEDURE: EGD, diagnostic ASA CLASS:  Class III INDICATIONS: Chest pain MEDICATIONS:  Fentanyl 25 mcg IV, Versed 4 mg IV  DESCRIPTION OF PROCEDURE:   After the risks benefits and alternatives of the procedure were thoroughly explained, informed consent was obtained.  The EG-[id number] ([id number]) endoscope was introduced through the mouth and advanced to the second portion of the duodenum, without limitations.  The instrument was slowly withdrawn as the mucosa was fully examined. <<PROCEDUREIMAGES>>      <<OLD IMAGES>>  FINDINGS:  There is a large hiatal hernia measuring from 30-40 cm. It is apparent that this hernia is intrathoracic. A mild antral gastritis was identified, but no biopsies were obtained given her age and the relatively mild findings.    No evidence of esophagitis.  Retroflexed views revealed no abnormalities.    The scope was then withdrawn from the patient and the procedure terminated.  COMPLICATIONS:  None  IMPRESSION: 1) Hiatal hernia RECOMMENDATIONS: 1) Esophagram.  The hiatal hernia may be the source of her chest pain.  _______________________________ Jeani Hawking, MD   CC: Alroy Dust, M.D.

## 2010-04-02 NOTE — Progress Notes (Signed)
Summary: wants something called in  Phone Note Call from Patient Call back at Home Phone [phone]   Caller: Patient Call For: Anabell Swint Summary of Call: [facility] [address] [phone]) for diarrhea.  Initial call taken by: Valinda Hoar,  April 09, 2009 12:40 PM  Follow-up for Phone Call        Spoke with pt; using imodium-diarrhea since Monday; drinking plenty of fluids(water and gatorade); temp/chills Monday and Tuesday but not today. Pt feels better today just cant get rid of diarrhea; not weak. Please advise. Reynaldo Minium CMA  April 09, 2009 2:00 PM   Additional Follow-up for Phone Call Additional follow up Details #1::        per SN--- ok for pt to have lomotil  #10  1 by mouth every 6 hours as needed for watery diarrhea with no refills.  thanks Randell Loop CMA  April 09, 2009 2:59 PM     Additional Follow-up for Phone Call Additional follow up Details #2::    PT aware Rx sent.Call if no better for OV. Reynaldo Minium CMA  April 09, 2009 3:08 PM   New/Updated Medications: LOMOTIL 2.5-0.025 MG TABS (DIPHENOXYLATE-ATROPINE) take 1 by mouth every 6 hours as needed watery stools Prescriptions: LOMOTIL 2.5-0.025 MG TABS (DIPHENOXYLATE-ATROPINE) take 1 by mouth every 6 hours as needed watery stools  #10 x 0   Entered by:   Reynaldo Minium CMA   Authorized by:   Michele Mcalpine MD   Signed by:   Reynaldo Minium CMA on 04/09/2009   Method used:   Telephoned to ...       [facility] [phone]* (retail)       [address] [place], [place]  [address]       Ph: [phone] or [phone]       Fax: [phone]   RxID:   [id number]

## 2010-04-02 NOTE — Assessment & Plan Note (Signed)
Summary: 4-6 months/apc   Primary Care Provider:  Alroy Dust, MD  CC:  4 month ROV& review of mult medical problems....  History of Present Illness: 77 y/o [name] here for a follow up visit... she has multiple medical problems as noted below...     ~  Mar10:  she saw DrRamos for L5 selective nerve root block but it didn't help her pain... she just saw DrDJones, Neurosurg for a second opinion- note pending... she reports that he wants to do a CT Myelogram and she is considering this...  ~  May10:  S/P decompressive lumbar laminectomy at L2-3, L3-4, and L4-5 by DrJones, Neurosurg.  ~  Sep10:  she was adm 7/10 w/ gastroent & dehydration- CT showed GB inflamm w/ ductal dilatation & left adrenal adenoma... she returns today- c/o LBP & left leg pain- eval by [facility] DrJones (MRI 8/10 showed s/p bilat laminectomies for decompression of the canal w/ signif sponylosis & foraminal stenosis)  w/ epid shot planned per DrBartko... she saw Urology DrTannenbaum 8/10 for hypotonic bladder, urge & stress incontinence- on Gelnique... we reviewed her pain med regimen per Neurosurg DrJones & f/u lab work.   ~  May 20, 2009:  she was hosp 2/11 w/ chest & abd pain- cholecystitis & stones- s/p lap chole by DrHoxsworth... also had EGD- HH, and FlexSig- mild colitis... stable after surg w/ slow recovery & f/u DrPerry...  her BP has been OK off all meds;  still on Simva40 for Chol but we don't have FLP on Rx to compare as yet;  Urology managed by DrTannenbaum- off Gelnique (it didn't help), using PT/ exercises to help w/ continence;  saw DrBartko for pain management...    ~  August 18, 2009:  she persists w/ c/o back pain> DrBeane, DrRamos, DrJones, DrBartko- on Neurontin, Celebrex, Flector, & Vicodin (she had recent shot from [employer])... she was c/o dysphagia & had EGD 6/11 by DrDBrodie- +HH, dilated, "it helped"... recent bronchitic exac Rx'd w/ Augmentin, Mucinex, Delsym...   ~  October Cathy, 2011:  her CC has been her  bladder- urinary incont, eval & Rx by DrTennenbaum, no help from Vesicare/ Gelnique, now getting accupuncture... asking for second opinion & I directed her to her GYN for this...  also concerned about her Ortho troubles- DrBeane did right knee injection, but she needs TKR;  ses DrBartko for pain management on Oxycodone, Flector, Neurontin, etc...  despite her stress level she refuses nerve pills...   Current Problem List:  ALLERGY (ICD-995.3) - she uses ALLEGRA 180mg  daily and wants to continue w/ this therapy...  HYPERTENSION (ICD-401.9) - on ASA 81mg /d, off Lasix & controlled w/ diet alone, but she notes- "I love salt", not checking BP's at home, etc...  BP=140/82 feeling well & denies HA, visual symptoms, CP, palpit, SOB, edema, etc...  HYPERCHOLESTEROLEMIA (ICD-272.0) - on SIMVASTATIN 40mg /d now...  ~  FLP 4/08 showed TChol 218, TG 86, HDL 77, LDL 117  ~  FLP 2/09 showed TChol 198, TG 48, HDL 73, LDL 116  ~  3/10- she ret on diet alone- FLP w/ TChol 263, TG 65, HDL 81, LDL 158... rec> start Simva40.  ~  9/10: needs FLP but not fasting today... LFTs are WNL.  ~  3/11: reminded of need for f/u FLP on the Simva40.  ~  FLP 6/11 on Simva40 showed TChol 189, TG 76, HDL 77, LDL 97... same Rx.  HIATAL HERNIA (ICD-553.3) - on NEXIUM 40mg /d... EGD 11/03 by DrDBrodie showed 5cm HH, &  esoph stricture dilated... developed dysphagia  5/11 w/ repeate EGD by DrDBrodie> large HH, dilated & improved (may need HH surg)...  denies nausea, vomiting, heartburn, diarrhea, constipation, blood in stool, abdominal pain, swelling, gas...  DIVERTICULOSIS OF COLON (ICD-562.10) & IRRITABLE BOWEL SYNDROME (ICD-564.1) -   ~  last colonoscopy was 11/03 by DrDBrodie & showed divertics only... f/u 82yrs.  ~  9/10:  she notes some constipation & Questran discontinued.  ~  2/11:  FlexSig in hosp showed lymphocytic colitis- Rx'd Entocort & resolved...  S/P CHOLECYSTECTOMY for Stones 2/11 by DrHoxsworth...  UTI (ICD-599.0) -  prev evals by GYN DrMcPhail, and Urology DrTannenbaum... she has frequency, urge & stress incontinence, and a hypotonic bladder- s/p pubovag sling surg for incont... they tried Bethanachol (she stopped this med) & Gelnique (she states didn't help)...  ~  Urology f/u by DrTannenbaum 12/10 reviewed & stable w/ phys therapy.  HYSTERECTOMY, HX OF (ICD-V45.77) - GYN= DrMcPhail who still has her on Premarin 1.25mg /d...  DEGENERATIVE JOINT DISEASE (ICD-715.90) - prev on CELEBREX (uses it Prn)... followed by Tora Perches for Ortho & DrJones for Neurosurg... she has had right knee injections but needs TKR per DrBeane.  BACK PAIN, LUMBAR (ICD-724.2) - she has chronic kyphoscoliosis... DrBeane has evaluated and Rx w/ shots, Pred, pain meds... she has seen DrRamos & had selective L5 nerve root block (without benefit)... second opinion from DrDJones, Neurosurg w/ lumbar laminectomy 5/10 providing temp relief...  ~  9/10: c/o recurrent LBP w/ MRI per DrJones and epid shot planned per DrBartko... now on FLECTOR Prn, NEURONTIN 100mg - 2Tid, VICODIN up to 3 per day...  ~  12/10: [facility] from [facility] reviewed- pain management.  ~  5/11:  seen by [facility] for another shot, Vicodin changed to OXYCODONE.  OSTEOPENIA (ICD-733.90) - she takes ALENDRONATE, Ca++, MVI, Vit D... last BMD was 6/08 at [facility] & showed TScores from -2.0 to -2.7, sl better than in 2005...  ~  Vit D level 2/09 was 30 (30-90) and rec to take Vit D 1000 u daily supplement...  ANXIETY (ICD-300.00) - she declines anxiolytic Rx despite her anxiety...  ANEMIA OF CHRONIC DISEASE (ICD-285.29) - labs 2/09 showed Hg= 10.4 & MCV= 90... taking Feosol 200mg /d...  ~  labs 3/10 showed Hg= 12.9  ~  labs 9/10 showed Hg= 12.0, Fe= 89  ~  labs 3/11 showed Hg= 10.7, Fe= 55, TIBC= 119, Sat=35%...  ~  labs 6/11 showed Hg= 11.8, Fe= 100 (37%sat)...   Allergies (verified): 1)  ! Sulfa 2)  ! Augmentin  Comments:  Nurse/Medical Assistant: The patient's medications  and allergies were reviewed with the patient and were updated in the Medication and Allergy Lists.  Past History:  Past Medical History: ALLERGY (ICD-995.3) HYPERTENSION (ICD-401.9) VENOUS INSUFFICIENCY (ICD-459.81) HYPERCHOLESTEROLEMIA (ICD-272.0) HIATAL HERNIA (ICD-553.3) DYSPHAGIA (ICD-787.29) DIVERTICULOSIS OF COLON (ICD-562.10) IRRITABLE BOWEL SYNDROME (ICD-564.1) COLITIS - LYMPHOCYTIC COLITIS FEB. 2011 (ICD-558.9) UTI (ICD-599.0) URINARY INCONTINENCE (ICD-788.30) DEGENERATIVE JOINT DISEASE (ICD-715.90) BACK PAIN, LUMBAR (ICD-724.2) OSTEOPENIA (ICD-733.90) ANXIETY (ICD-300.00) ANEMIA OF CHRONIC DISEASE (ICD-285.29)  Past Surgical History: S/P cataract surgery S/P hysterectomy S/P decompressive lumbar laminectomy at L2-3, L3-4, and L4-5 by DrJones 5/10 S/P lap chole 2/11 by DrHoxsworth  Family History: Reviewed history from 07/29/2009 and no changes required. Cancer- lung daughter, brother DM - son CAD - son, mother Mother-CHF Family History of Colon Cancer: brother cervical cancer: daughter No FH of Colon Cancer:  Social History: Reviewed history from 05/26/2009 and no changes required. former smoker -quit age 30 smoked  ~10 yr  1/2 d.  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  The patient denies anorexia, fever, weight loss, weight gain, vision loss, hoarseness, chest pain, syncope, peripheral edema, prolonged cough, headaches, hemoptysis, abdominal pain, melena, hematochezia, severe indigestion/heartburn, hematuria, muscle weakness, suspicious skin lesions, transient blindness, depression, unusual weight change, abnormal bleeding, enlarged lymph nodes, and angioedema.    Vital Signs:  Patient profile:   77 year old female Weight:      133.25 pounds O2 Sat:      96 % on Room  air Temp:     98.4 degrees F oral Pulse rate:   66 / minute BP sitting:   140 / 82  (left arm) Cuff size:   regular  Vitals Entered By: Abigail Miyamoto RN (October Cathy, 2011 2:09 PM)  O2 Flow:  Room air  Physical Exam  Additional Exam:  WD, WN, 77 y/o [name] in NAD... GENERAL:  Alert & oriented; pleasant & cooperative... HEENT:  [place]/AT, EOM-full, PERRLA, EACs-clear, TMs-wnl, NOSE-clear, THROAT-clear & wnl. NECK:  Supple w/ fairROM; no JVD; normal carotid impulses w/o bruits; no thyromegaly or nodules palpated; no lymphadenopathy. CHEST:  Clear to P & A; without wheezes/ rales/ or rhonchi heard... HEART:  Regular Rhythm; without murmurs/ rubs/ or gallops detected... ABDOMEN:  Soft & sl tenderness, normal bowel sounds; no organomegaly or masses palpated...no guarding or rebound.  BACK:  s/p lumbar lam & scoliosis/ kyphosis EXT: without deformities, mild arthritic changes; no varicose veins/ +venous insuffic/ tr-1+ edema... NEURO:  CN's intact; no focal neuro deficits... DERM:  No lesions noted; no rash etc...    Impression & Recommendations:  Problem # 1:  HYPERTENSION (ICD-401.9) Controlled on diet alone now>  she knows to elim sodium...  Problem # 2:  HYPERCHOLESTEROLEMIA (ICD-272.0) Stable on Simva40 + diet Rx... Her updated medication list for this problem includes:    Simvastatin 40 Mg Tabs (Simvastatin) .[name]... Take 1 tab by mouth at bedtime  Problem # 3:  HIATAL HERNIA (ICD-553.3) GI is stable>  continue same Rx & f/u colon due 2013... Her updated medication list for this problem includes:    Nexium 40 Mg Cpdr (Esomeprazole magnesium) .[name]... Take 1 cap by mouth once daily...  Problem # 4:  URINARY INCONTINENCE (ICD-788.30) Recurrent UTIs and Incont followed by DrTannenbaum & now on accupuncture Rx...  she will check w/ GYN for 2nd opinion. Orders: Gynecologic Referral (Gyn)  Problem # 5:  BACK PAIN, LUMBAR (ICD-724.2) Followed by Ortho, NS, Pain management... Her updated  medication list for this problem includes:    Aspirin Adult Low Strength 81 Mg Tbec (Aspirin) .[name]... Take 1 tablet by mouth daily    Oxycodone-acetaminophen 5-325 Mg Tabs (Oxycodone-acetaminophen) .[name]... Take 1 tab by mouth every 6h as needed for pain...    Celebrex 200 Mg Caps (Celecoxib) .[name]... 1 by mouth once daily as needed joint pain  Problem # 6:  OTHER MEDICAL PROBLEMS AS NOTED>>>  Complete Medication List: 1)  Allegra 180 Mg Tabs (Fexofenadine hcl) .... Take 1 tab by mouth once daily as needed for allergies 2)  Aspirin Adult Low Strength 81 Mg Tbec (Aspirin) .... Take 1 tablet by mouth daily 3)  Simvastatin 40 Mg Tabs (Simvastatin) .... Take 1 tab by mouth at bedtime 4)  Nexium 40 Mg Cpdr (Esomeprazole  magnesium) .... Take 1 cap by mouth once daily.Marland KitchenMarland Kitchen 5)  Oxycodone-acetaminophen 5-325 Mg Tabs (Oxycodone-acetaminophen) .... Take 1 tab by mouth every 6h as needed for pain.Marland KitchenMarland Kitchen 6)  Neurontin 300 Mg Caps (Gabapentin) .... Take 1 cap by mouth three times a day as directed... 7)  Flector 1.3 % Ptch (Diclofenac epolamine) .... Apply as directed by drbartko... 8)  Celebrex 200 Mg Caps (Celecoxib) .[name].. 1 by mouth once daily as needed joint pain 9)  Alendronate Sodium 70 Mg Tabs (Alendronate sodium) .... Take 1 tab by mouth each week... 10)  Oscal 500/200 D-3 500-200 Mg-unit Tabs (Calcium-vitamin d) .... Take 1 tablet by mouth once a day 11)  Vitamin D 1000 Unit Caps (Cholecalciferol) .[name].. 1 by mouth once daily 12)  Feosol 200 Mg Tabs (Ferrous sulfate) .[name].. 1 by mouth once daily  Patient Instructions: 1)  Today we updated your med list- see below.... 2)  We refilled the meds you requested... 3)  We will arrange for a GYN check by DrRichardson per your request as well... 4)  Continue your current meds the same for now... 5)  Call for any problems.Marland KitchenMarland Kitchen 6)  Please schedule a follow-up appointment in 4 months, w/ fasting blood work at that time... Prescriptions: ALENDRONATE SODIUM 70 MG TABS  (ALENDRONATE SODIUM) take 1 tab by mouth each week...  #12 x 4   Entered and Authorized by:   Michele Mcalpine MD   Signed by:   Michele Mcalpine MD on 10/Cathy/2011   Method used:   Print then Give to Patient   RxID:   [id number] CELEBREX 200 MG CAPS (CELECOXIB) 1 by mouth once daily as needed joint pain  #90 x 4   Entered and Authorized by:   Michele Mcalpine MD   Signed by:   Michele Mcalpine MD on 10/Cathy/2011   Method used:   Print then Give to Patient   RxID:   [id number] SIMVASTATIN 40 MG TABS (SIMVASTATIN) take 1 tab by mouth at bedtime  #90 x 4   Entered and Authorized by:   Michele Mcalpine MD   Signed by:   Michele Mcalpine MD on 10/Cathy/2011   Method used:   Print then Give to Patient   RxID:   [id number]

## 2010-04-02 NOTE — Progress Notes (Signed)
Summary: Triage  Phone Note Other Incoming   Caller: Jennye Moccasin  [phone] Summary of Call: Wants to schedule a Hosp. f/u for this patient Initial call taken by: Karna Christmas,  April 22, 2009 2:37 PM  Follow-up for Phone Call        Called pt at home to make appt with extender for 1 mo.  Asked pt to call me and I will try to make appt if schedule in the system. Follow-up by: Joselyn Glassman,  April 22, 2009 4:08 PM

## 2010-04-02 NOTE — Assessment & Plan Note (Signed)
Summary: rov 3 months///kp   Primary Care Provider:  Alroy Dust, MD  CC:  3 month ROV & review....  History of Present Illness: 77 y/o WF here for a follow up visit... she has multiple medical problems as noted below...     ~  Nov09:  she has mult arthritic complaints w/ back pain to left leg ("it's siatica"), left shoulder pain, etc... eval by Tora Perches w/ PT/ DCN100/ Pred trial... she wants second opinion from Rheum, and handicap sticker...   ~  Mar10:  she saw DrRamos for L5 selective nerve root block but it didn't help her pain... she just saw DrDJones, Neurosurg for a second opinion- note pending... she reports that he wants to do a CT Myelogram and she is considering this... otherwise she is stable without new complainjts or concerns...  ~  May10:  S/P decompressive lumbar laminectomy at L2-3, L3-4, and L4-5 by DrJones, Neurosurg.  ~  Sep10:  she was adm 7/10 w/ gastroent & dehydration- weak, FTT... CT showed GB inflamm w/ ductal dilatation & left adrenal adenoma... she returns today- c/o LBP & left leg pain- eval by [facility] DrJones (MRI 8/10 showed s/p bilat laminectomies for decompression of the canal w/ signif sponylosis & foraminal stenosis)  w/ epid shot planned per DrBartko... she saw Urology DrTannenbaum 8/10 for hypotonic bladder, urge & stress incontinence- on Gelnique... we reviewed her pain med regimen per Neurosurg DrJones & f/u lab work.   ~  May 20, 2009:  she was hosp 2/11 w/ chest & abd pain- cholecystitis & stones- s/p lap chole by DrHoxsworth... also had EGD- HH, and FlexSig- mild colitis... stable after surg w/ slow recovery & f/u DrPerry is pending...  her BP has been OK off all meds;  still on Simva40 for Chol but we don't have FLP on Rx to compare as yet;  Urology managed by DrTannenbaum- off Gelnique (it didn't help), using PT/ exercises to help w/ continence;  saw DrBartko for pain management...    ~  August 18, 2009:  she persists w/ c/o back pain> DrBeane, DrRamos,  DrJones, DrBartko- on Neurontin, Celebrex, Flector, & Vicodin (she had recent shot from [employer])... she was c/o dysphagia & had EGD 6/11 by DrDBrodie- +HH, dilated, "it helped" (may need HH surg)... recent bronchitic exac Rx'd w/ Augmentin, Mucinex, Delsym...   Current Problem List:  ALLERGY (ICD-995.3) - she uses ALLEGRA 180mg  daily and wants to continue w/ this therapy...  HYPERTENSION (ICD-401.9) - on ASA 81mg /d, off Lasix & controlled w/ diet alone, but she notes- "I love salt", not checking BP's at home, etc...  BP=110/64 feeling well & denies HA, visual symptoms, CP, palpit, SOB, edema, etc...  HYPERCHOLESTEROLEMIA (ICD-272.0) - on SIMVASTATIN 40mg /d now...  ~  FLP 4/08 showed TChol 218, TG 86, HDL 77, LDL 117  ~  FLP 2/09 showed TChol 198, TG 48, HDL 73, LDL 116  ~  3/10- she ret on diet alone- FLP w/ TChol 263, TG 65, HDL 81, LDL 158... rec> start Simva40.  ~  9/10: needs FLP but not fasting today... LFTs are WNL.  ~  3/11: reminded of need for f/u FLP on the Simva40.  ~  FLP 6/11 on Simva40 showed TChol 189, TG 76, HDL 77, LDL 97... same Rx.  HIATAL HERNIA (ICD-553.3) - on NEXIUM 40mg /d... EGD 11/03 by DrDBrodie showed 5cm HH, & esoph stricture dilated... developed dysphagia  5/11 w/ repeate EGD by DrDBrodie> large HH, dilated & improved (may need HH surg).Marland KitchenMarland Kitchen  denies nausea, vomiting, heartburn, diarrhea, constipation, blood in stool, abdominal pain, swelling, gas...  DIVERTICULOSIS OF COLON (ICD-562.10) & IRRITABLE BOWEL SYNDROME (ICD-564.1) -   ~  last colonoscopy was 11/03 by DrDBrodie & showed divertics only...  ~  9/10:  she notes some constipation & Questran discontinued.  ~  2/11:  FlexSig in hosp showed lymphocytic colitis- Rx'd Entocort & resolved...  S/P CHOLECYSTECTOMY for Stones 2/11 by DrHoxsworth...  UTI (ICD-599.0) - prev evals by GYN DrMcPhail, and Urology DrTannenbaum... she has frequency, urge & stress incontinence, and a hypotonic bladder- s/p pubovag sling surg  for incont... they tried Bethanachol (she stopped this med) & Gelnique (she states didn't help)...  ~  Urology f/u by DrTannenbaum 12/10 reviewed & stable w/ phys therapy.  HYSTERECTOMY, HX OF (ICD-V45.77) - GYN= DrMcPhail who still has her on Premarin 1.25mg /d...  DEGENERATIVE JOINT DISEASE (ICD-715.90) - prev on CELEBREX (uses it Prn)... followed by Tora Perches for Ortho & DrJones for Neurosurg.  BACK PAIN, LUMBAR (ICD-724.2) - she has chronic kyphoscoliosis... DrBeane has evaluated and Rx w/ shots, Pred, DCN100... she has seen DrRamos & had selective L5 nerve root block (without benefit)... second opinion from DrDJones, Neurosurg w/ lumbar laminectomy 5/10 providing temp relief...  ~  9/10: c/o recurrent LBP w/ MRI per DrJones and epid shot planned per DrBartko... now on FLECTOR Prn, NEURONTIN 100mg - 2Tid, VICODIN up to 3 per day...  ~  12/10: note from [facility] reviewed- pain management.  ~  5/11:  seen by [facility] for another shot.  OSTEOPENIA (ICD-733.90) - she takes ALENDRONATE, Ca++, MVI, Vit D... last BMD was 6/08 at [facility] & showed TScores from -2.0 to -2.7, sl better than in 2005...  ~  Vit D level 2/09 was 30 (30-90) and rec to take Vit D 1000 u daily supplement...  ANXIETY (ICD-300.00)  ANEMIA OF CHRONIC DISEASE (ICD-285.29) - labs 2/09 showed Hg= 10.4 & MCV= 90... taking Feosol 200mg /d...  ~  labs 3/10 showed Hg= 12.9  ~  labs 9/10 showed Hg= 12.0, Fe= 89  ~  labs 3/11 showed Hg= 10.7, Fe= 55, TIBC= 119, Sat=35%...  ~  labs 6/11 showed Hg= 11.8, Fe= 100 (37%sat)...   Current Medications (verified): 1)  Allegra 180 Mg Tabs (Fexofenadine Hcl) .... Take 1 Tab By Mouth Once Daily As Needed For Allergies 2)  Aspirin Adult Low Strength 81 Mg Tbec (Aspirin) .... Take 1 Tablet By Mouth Daily 3)  Simvastatin 40 Mg Tabs (Simvastatin) .... Take 1 Tab By Mouth At Bedtime 4)  Nexium 40 Mg  Cpdr (Esomeprazole Magnesium) .... Take 1 Cap By Mouth Once Daily.Marland KitchenMarland Kitchen 5)  Hydrocodone-Acetaminophen  5-500 Mg Tabs (Hydrocodone-Acetaminophen) .... Take 1 Tab By Mouth Every 6 H As Needed For Pain... 6)  Neurontin 100 Mg Caps (Gabapentin) .... Take 2  Tabs  By Mouth Three Times A Day... 7)  Flector 1.3 % Ptch (Diclofenac Epolamine) .... Apply As Directed By Drbartko... 8)  Celebrex 200 Mg Caps (Celecoxib) .[name].. 1 By Mouth Once Daily As Needed Joint Pain 9)  Alendronate Sodium 70 Mg Tabs (Alendronate Sodium) .... Take 1 Tablet By Mouth Once A Week 10)  Oscal 500/200 D-3 500-200 Mg-Unit Tabs (Calcium-Vitamin D) .... Take 1 Tablet By Mouth Once A Day 11)  Vitamin D 1000 Unit  Caps (Cholecalciferol) .[name].. 1 By Mouth Once Daily 12)  Feosol 200 Mg  Tabs (Ferrous Sulfate) .[name].. 1 By Mouth Once Daily  Allergies (verified): 1)  ! Sulfa 2)  ! Augmentin  Comments:  Nurse/Medical  Assistant: The patient's medications and allergies were reviewed with the patient and were updated in the Medication and Allergy Lists. Boone Master CNA/MA  August 18, 2009 2:17 PM   Past History:  Past Medical History: ALLERGY (ICD-995.3) HYPERTENSION (ICD-401.9) VENOUS INSUFFICIENCY (ICD-459.81) HYPERCHOLESTEROLEMIA (ICD-272.0) HIATAL HERNIA (ICD-553.3) DYSPHAGIA (ICD-787.29) DIVERTICULOSIS OF COLON (ICD-562.10) IRRITABLE BOWEL SYNDROME (ICD-564.1) COLITIS - LYMPHOCYTIC COLITIS FEB. 2011 (ICD-558.9) UTI (ICD-599.0) URINARY INCONTINENCE (ICD-788.30) DEGENERATIVE JOINT DISEASE (ICD-715.90) BACK PAIN, LUMBAR (ICD-724.2) OSTEOPENIA (ICD-733.90) ANXIETY (ICD-300.00) ANEMIA OF CHRONIC DISEASE (ICD-285.29)  Past Surgical History: S/P cataract surgery S/P hysterectomy S/P decompressive lumbar laminectomy at L2-3, L3-4, and L4-5 by DrJones 5/10 S/P lap chole 2/11 by DrHoxsworth  Family History: Reviewed history from 07/29/2009 and no changes required. Cancer- lung daughter, brother DM - son CAD - son, mother Mother-CHF Family History of Colon Cancer: brother cervical cancer: daughter No FH of Colon  Cancer:  Social History: Reviewed history from 05/26/2009 and no changes required. former smoker -quit age 31 smoked  ~10 yr 1/2 d.  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  The patient denies anorexia, fever, weight loss, weight gain, vision loss, hoarseness, chest pain, syncope, peripheral edema, prolonged cough, headaches, hemoptysis, abdominal pain, melena, hematochezia, severe indigestion/heartburn, hematuria, incontinence, muscle weakness, suspicious skin lesions, transient blindness, depression, unusual weight change, abnormal bleeding, enlarged lymph nodes, and angioedema.    Vital Signs:  Patient profile:   77 year old female Height:      59 inches Weight:      135 pounds BMI:     27.37 O2 Sat:      96 % on Room air Temp:     98.7 degrees F oral Pulse rate:   63 / minute BP sitting:   110 / 64  (left arm) Cuff size:   regular  Vitals Entered By: Boone Master CNA/MA (August 18, 2009 2:17 PM)  O2 Flow:  Room air  Physical Exam  Additional Exam:  WD, WN, 77 y/o WF in NAD... GENERAL:  Alert & oriented; pleasant & cooperative... HEENT:  [place]/AT, EOM-full, PERRLA, EACs-clear, TMs-wnl, NOSE-clear, THROAT-clear & wnl. NECK:  Supple w/ fairROM; no JVD; normal carotid impulses w/o bruits; no thyromegaly or nodules palpated; no lymphadenopathy. CHEST:  Clear to P & A; without wheezes/ rales/ or rhonchi heard... HEART:  Regular Rhythm; without murmurs/ rubs/ or gallops detected... ABDOMEN:  Soft & sl tenderness, normal bowel sounds; no organomegaly or masses palpated...no guarding or rebound.  BACK:  s/p lumbar lam & scoliosis/ kyphosis EXT: without deformities, mild arthritic changes; no varicose veins/ +venous insuffic/ tr-1+ edema... NEURO:  CN's intact; no focal neuro deficits... DERM:  No lesions noted; no rash  etc...    MISC. Report  Procedure date:  08/20/2009  Findings:      Lipid Panel (LIPID)   Cholesterol               189 mg/dL                   0-454   Triglycerides             76.0 mg/dL                  0.9-811.9   HDL  76.90 mg/dL                 >04.54   LDL Cholesterol           97 mg/dL                    0-98   BMP (METABOL)   Sodium                    140 mEq/L                   135-145   Potassium                 4.6 mEq/L                   3.5-5.1   Chloride                  102 mEq/L                   96-112   Carbon Dioxide            32 mEq/L                    19-32   Glucose                   71 mg/dL                    11-91   BUN                       20 mg/dL                    4-78   Creatinine                0.8 mg/dL                   2.9-5.6   Calcium                   9.0 mg/dL                   2.1-30.8   GFR                       69.36 mL/min                >60  Hepatic/Liver Function Panel (HEPATIC)   Total Bilirubin           0.6 mg/dL                   6.5-7.8   Direct Bilirubin          0.1 mg/dL                   4.6-9.6   Alkaline Phosphatase      42 U/L                      39-117   AST                       23 U/L                      0-37   ALT  26 U/L                      0-35   Total Protein             6.5 g/dL                    1.6-1.0   Albumin                   4.0 g/dL                    9.6-0.4  Comments:      CBC Platelet w/Diff (CBCD)   White Cell Count          7.3 K/uL                    4.5-10.5   Red Cell Count       [L]  3.75 Mil/uL                 3.87-5.11   Hemoglobin           [L]  11.8 g/dL                   54.0-98.1   Hematocrit           [L]  33.9 %                      36.0-46.0   MCV                       90.5 fl                     78.0-100.0   Platelet Count            193.0 K/uL                  150.0-400.0   Neutrophil %              73.9 %                       43.0-77.0   Lymphocyte %              16.5 %                      12.0-46.0   Monocyte %                8.1 %                       3.0-12.0   Eosinophils%              1.3 %                       0.0-5.0   Basophils %               0.2 %                       0.0-3.0   TSH (TSH)   FastTSH                   1.72 uIU/mL                 0.35-5.50   IBC  Panel (IBC)   Iron                      100 ug/dL                   44-034   Transferrin          [L]  191.8 mg/dL                 742.5-956.3   Iron Saturation           37.2 %                      20.0-50.0   Impression & Recommendations:  Problem # 1:  HYPERTENSION (ICD-401.9) Controlled on diet alone... she knows to elim sodium/ salt.  Problem # 2:  HYPERCHOLESTEROLEMIA (ICD-272.0) Stable on the Simva40> continue same. Her updated medication list for this problem includes:    Simvastatin 40 Mg Tabs (Simvastatin) .[name]... Take 1 tab by mouth at bedtime  Problem # 3:  HIATAL HERNIA (ICD-553.3) She has huge HH & may need surg> followed by DrDBrodie on Nexium Rx> s/p dilatation (she says better)... Her updated medication list for this problem includes:    Nexium 40 Mg Cpdr (Esomeprazole magnesium) .[name]... Take 1 cap by mouth once daily...  Problem # 4:  UTI (ICD-599.0) Followed by Debby Freiberg- nothing has helped> stable after the phys therapy. The following medications were removed from the medication list:    Metronidazole 250 Mg Tabs (Metronidazole) .[name]... Take 1 by mouth three times a day  Problem # 5:  BACK PAIN, LUMBAR (ICD-724.2) She has mult specialists & Pain Management on her side... Her updated medication list for this problem includes:    Aspirin Adult Low Strength 81 Mg Tbec (Aspirin) .[name]... Take 1 tablet by mouth daily    Hydrocodone-acetaminophen 5-500 Mg Tabs (Hydrocodone-acetaminophen) .[name]... Take 1 tab by mouth every 6 h as needed for pain...    Celebrex 200 Mg Caps (Celecoxib) .[name]... 1 by mouth once daily as needed joint  pain  Problem # 6:  OSTEOPENIA (ICD-733.90) Stable on the Fosamax, MVI, Vits, etc... Her updated medication list for this problem includes:    Alendronate Sodium 70 Mg Tabs (Alendronate sodium) .[name]... Take 1 tablet by mouth once a week  Problem # 7:  ANEMIA OF CHRONIC DISEASE (ICD-285.29) Improved w/ the Fe & s/p GB... continue Rx. Her updated medication list for this problem includes:    Feosol 200 Mg Tabs (Ferrous sulfate) .[name]... 1 by mouth once daily  Problem # 8:  OTHER MEDICAL PROBLEMS AS NOTED>>>  Complete Medication List: 1)  Allegra 180 Mg Tabs (Fexofenadine hcl) .... Take 1 tab by mouth once daily as needed for allergies 2)  Aspirin Adult Low Strength 81 Mg Tbec (Aspirin) .... Take 1 tablet by mouth daily 3)  Simvastatin 40 Mg Tabs (Simvastatin) .... Take 1 tab by mouth at bedtime 4)  Nexium 40 Mg Cpdr (Esomeprazole magnesium) .... Take 1 cap by mouth once daily.Marland KitchenMarland Kitchen 5)  Hydrocodone-acetaminophen 5-500 Mg Tabs (Hydrocodone-acetaminophen) .... Take 1 tab by mouth every 6 h as needed for pain.Marland KitchenMarland Kitchen 6)  Neurontin 300 Mg Caps (Gabapentin) .... Take 1 cap by mouth three times a day as directed... 7)  Flector 1.3 % Ptch (Diclofenac epolamine) .... Apply as directed by drbartko... 8)  Celebrex 200 Mg Caps (Celecoxib) .[name].. 1 by mouth once daily as needed joint pain 9)  Alendronate Sodium 70 Mg Tabs (  Alendronate sodium) .... Take 1 tablet by mouth once a week 10)  Oscal 500/200 D-3 500-200 Mg-unit Tabs (Calcium-vitamin d) .... Take 1 tablet by mouth once a day 11)  Vitamin D 1000 Unit Caps (Cholecalciferol) .[name].. 1 by mouth once daily 12)  Feosol 200 Mg Tabs (Ferrous sulfate) .[name].. 1 by mouth once daily  Patient Instructions: 1)  Today we updated your med list- see below.... 2)  Remember to stay on the [facility] 600mg - 2 tabs by mouth two times a day w/ plenty of fluids.Marland KitchenMarland Kitchen 3)  Please return to our lab one morning this week for your FASTING blood work... please call the "phone tree" in a few days  for your lab results.Marland KitchenMarland Kitchen 4)  Please return to see DrBartko for Pain Management > to see what else they can recommend for your on-going discomfort... 5)  Call for any questions.Marland KitchenMarland Kitchen 6)  Please schedule a follow-up appointment in 4-6 months.

## 2010-04-02 NOTE — Assessment & Plan Note (Signed)
Summary: Acute NP office visit - bronchitis   Copy to:  na Primary Provider/Referring Provider:  Alroy Dust, MD  CC:  Acute visit.  Pt c/o cough x 4 days- prod with yellow/green sputum.  She also c/o nasal congestion with occ green nasal d/c.  [name]  History of Present Illness: 77 y/o WF here for a follow up visit... she has multiple medical problems as noted below...      ~  May 20, 2009:  she was hosp 2/11 w/ chest & abd pain- cholecystitis & stones- s/p lap chole by DrHoxsworth... also had EGD- HH, and FlexSig- mild colitis... stable after surg w/ slow recovery & f/u DrPerry is pending...  her BP has been OK off all meds;  still on Simva40 for Chol but we don't have FLP on Rx to compare as yet;  Urology managed by DrTannenbaum- off Gelnique (it didn't help), using PT/ exercises to help w/ continence;  saw DrBartko for pain management...   August 06, 2009--Presents for an acute office visit. Complains of cough x 4 days- prod with yellow/green sputum.  She also c/o nasal congestion with occ green nasal d/c.  Denies chest pain,  orthopnea, hemoptysis, fever, n/v/d, edema, headache. OTC not helping. Cough is getting worse esp at night.      Preventive Screening-Counseling & Management  Alcohol-Tobacco     Smoking Status: quit > 6 months  Current Medications (verified): 1)  Allegra 180 Mg Tabs (Fexofenadine Hcl) .... Take 1 Tab By Mouth Once Daily As Needed For Allergies 2)  Aspirin Adult Low Strength 81 Mg Tbec (Aspirin) .... Take 1 Tablet By Mouth Daily 3)  Simvastatin 40 Mg Tabs (Simvastatin) .... Take 1 Tab By Mouth At Bedtime 4)  Nexium 40 Mg  Cpdr (Esomeprazole Magnesium) .... Take 1 Cap By Mouth Once Daily.Marland KitchenMarland Kitchen 5)  Hydrocodone-Acetaminophen 5-500 Mg Tabs (Hydrocodone-Acetaminophen) .... Take 1 Tab By Mouth Every 6 H As Needed For Pain... 6)  Neurontin 100 Mg Caps (Gabapentin) .... Take 2  Tabs  By Mouth Three Times A Day... 7)  Flector 1.3 % Ptch (Diclofenac Epolamine) .... Apply As  Directed By Drbartko... 8)  Celebrex 200 Mg Caps (Celecoxib) .[name].. 1 By Mouth Once Daily As Needed Joint Pain 9)  Alendronate Sodium 70 Mg Tabs (Alendronate Sodium) .... Take 1 Tablet By Mouth Once A Week 10)  Oscal 500/200 D-3 500-200 Mg-Unit Tabs (Calcium-Vitamin D) .... Take 1 Tablet By Mouth Once A Day 11)  Vitamin D 1000 Unit  Caps (Cholecalciferol) .[name].. 1 By Mouth Once Daily 12)  Feosol 200 Mg  Tabs (Ferrous Sulfate) .[name].. 1 By Mouth Once Daily  Allergies (verified): 1)  ! Sulfa  Past History:  Past Medical History: Last updated: 05/26/2009 Current Problems:  LYMPHOCYTIC COLITIS (ICD-558.9) VENOUS INSUFFICIENCY (ICD-459.81) ALLERGY (ICD-995.3) HYPERTENSION (ICD-401.9) HYPERCHOLESTEROLEMIA (ICD-272.0) HIATAL HERNIA (ICD-553.3) DIVERTICULOSIS OF COLON (ICD-562.10) IRRITABLE BOWEL SYNDROME (ICD-564.1) UTI (ICD-599.0) URINARY INCONTINENCE (ICD-788.30) DEGENERATIVE JOINT DISEASE (ICD-715.90) BACK PAIN, LUMBAR (ICD-724.2) OSTEOPENIA (ICD-733.90) ANXIETY (ICD-300.00) ANEMIA OF CHRONIC DISEASE (ICD-285.29)  Past Surgical History: Last updated: 05/26/2009 S/P cataract surgery S/P hysterectomy S/P decompressive lumbar laminectomy at L2-3, L3-4, and L4-5 by DrJones 5/10 S/P lap chole 2/11 by DrHoxsworth  Family History: Last updated: 07/29/2009 Cancer- lung daughter, brother DM - son CAD - son, mother Mother-CHF Family History of Colon Cancer: brother cervical cancer: daughter No FH of Colon Cancer:  Social History: Last updated: 05/26/2009 former smoker -quit age 45 smoked  ~10 yr 1/2 d.  no alcohol retired  Live alone- [place] Widowed Daily Caffeine Use one per day  Risk Factors: Smoking Status: quit > 6 months (08/06/2009)  Social History: Smoking Status:  quit > 6 months  Review of Systems      See HPI  Vital Signs:  Patient profile:   77 year old female Weight:      129 pounds O2 Sat:      96 % on Room air Temp:     98.9 degrees F oral Pulse rate:    69 / minute BP sitting:   118 / 76  (left arm)  Vitals Entered By: Vernie Murders (August 06, 2009 10:55 AM)  O2 Flow:  Room air CC: Acute visit.  Pt c/o cough x 4 days- prod with yellow/green sputum.  She also c/o nasal congestion with occ green nasal d/c.   Is Patient Diabetic? No   Physical Exam  Additional Exam:  WD, WN, 77 y/o WF in NAD...obese GENERAL:  Alert & oriented; pleasant & cooperative... HEENT:  [place]/AT,  EACs-clear, TMs-wnl, NOSE-clear, THROAT-clear & wnl. NECK:  Supple w/ fairROM; no JVD; normal carotid impulses w/o bruits; no thyromegaly or nodules palpated; no lymphadenopathy. CHEST:  Coarse BS w/ no wheezing  HEART:  Regular Rhythm; without murmurs/ rubs/ or gallops detected... ABDOMEN:  Soft & NT,  normal bowel sounds; no organomegaly or masses palpated.  EXT: without deformities, mild arthritic changes; no varicose veins/ +venous insuffic/ tr-1+ edema..., arthritic changes of the hands,   NEURO:  CN's intact; no focal neuro deficits... DERM:  No lesions noted; no rash etc...     Impression & Recommendations:  Problem # 1:  UPPER RESPIRATORY INFECTION (ICD-465.9)  Augmentin 875mg  two times a day for 7 days w/ food.  Mucinex and delsym two times a day as needed cough/congestion  Increase fluids.  Saline nasal rinses as needed  Please contact office for sooner follow up if symptoms do not improve or worsen  Her updated medication list for this problem includes:    Allegra 180 Mg Tabs (Fexofenadine hcl) .[name]... Take 1 tab by mouth once daily as needed for allergies    Aspirin Adult Low Strength 81 Mg Tbec (Aspirin) .[name]... Take 1 tablet by mouth daily    Celebrex 200 Mg Caps (Celecoxib) .[name]... 1 by mouth once daily as needed joint pain  Orders: Est. Patient Level III (16109)  Medications Added to Medication List This Visit: 1)  Augmentin 875-125 Mg Tabs (Amoxicillin-pot clavulanate) .[name].. 1 by mouth two times a day  Complete Medication List: 1)  Allegra 180 Mg  Tabs (Fexofenadine hcl) .... Take 1 tab by mouth once daily as needed for allergies 2)  Aspirin Adult Low Strength 81 Mg Tbec (Aspirin) .... Take 1 tablet by mouth daily 3)  Simvastatin 40 Mg Tabs (Simvastatin) .... Take 1 tab by mouth at bedtime 4)  Nexium 40 Mg Cpdr (Esomeprazole magnesium) .... Take 1 cap by mouth once daily.Marland KitchenMarland Kitchen 5)  Hydrocodone-acetaminophen 5-500 Mg Tabs (Hydrocodone-acetaminophen) .... Take 1 tab by mouth every 6 h as needed for pain.Marland KitchenMarland Kitchen 6)  Neurontin 100 Mg Caps (Gabapentin) .... Take 2  tabs  by mouth three times a day... 7)  Flector 1.3 % Ptch (Diclofenac epolamine) .... Apply as directed by drbartko... 8)  Celebrex 200 Mg Caps (Celecoxib) .[name].. 1 by mouth once daily as needed joint pain 9)  Alendronate Sodium 70 Mg Tabs (Alendronate sodium) .... Take 1 tablet by mouth once a week 10)  Oscal 500/200 D-3 500-200 Mg-unit Tabs (Calcium-vitamin  d) .... Take 1 tablet by mouth once a day 11)  Vitamin D 1000 Unit Caps (Cholecalciferol) .[name].. 1 by mouth once daily 12)  Feosol 200 Mg Tabs (Ferrous sulfate) .[name].. 1 by mouth once daily 13)  Augmentin 875-125 Mg Tabs (Amoxicillin-pot clavulanate) .[name].. 1 by mouth two times a day  Other Orders: Nebulizer Tx ([id number])  Patient Instructions: 1)  Augmentin 875mg  two times a day for 7 days w/ food.  2)  Mucinex and delsym two times a day as needed cough/congestion  3)  Increase fluids.  4)  Saline nasal rinses as needed  5)  Please contact office for sooner follow up if symptoms do not improve or worsen  Prescriptions: AUGMENTIN 875-125 MG TABS (AMOXICILLIN-POT CLAVULANATE) 1 by mouth two times a day  #14 x 0   Entered and Authorized by:   Rubye Oaks NP   Signed by:   Rubye Oaks NP on 08/06/2009   Method used:   Electronically to        [facility] [phone]* (retail)       [address] [place], [place]  [address]       Ph: [phone] or [phone]       Fax: [phone]   RxID:   [id number]

## 2010-04-02 NOTE — Progress Notes (Signed)
Summary: pt in hosp  Phone Note Call from Patient Call back at [phone] ([phone])   Caller: Daughter-Cathy [name] Call For: Kriste Basque Reason for Call: Talk to Nurse Summary of Call: pt in hospital at [facility] Room# 5149 - ran all kinds of test, not sure, but daughter thinks it may be d-ciff.  Daughter really wants SN to look at all the test.   Initial call taken by: Eugene Gavia,  April 21, 2009 8:59 AM  Follow-up for Phone Call        FYI for Dr. Kriste Basque.Cathy [name] [facility]  April 21, 2009 9:03 AM   SN is aware of message. Randell Loop CMA  April 21, 2009 10:02 AM

## 2010-04-02 NOTE — Consult Note (Signed)
Summary: Cholelithiasis, Abdominal Pain   NAME:  Cathy [name], Cathy [name]                      ACCOUNT NO.:      MEDICAL RECORD NO.:  [id number]           PATIENT TYPE:      LOCATION:                                 FACILITY:      PHYSICIAN:  Sharlet Salina T. Hoxworth, M.D.DATE OF BIRTH:  [birth year]      DATE OF CONSULTATION:  04/22/2009   DATE OF DISCHARGE:                                    CONSULTATION      REQUESTING PHYSICIAN:  Marinda Elk, MD      CONSULTING GASTROENTEROLOGIST:  Wilhemina Bonito. Marina Goodell, MD      REASON FOR CONSULTATION:  Cholelithiasis, abdominal pain.      HISTORY OF PRESENT ILLNESS:  [name] is a pleasant 77 year old female   who was admitted several days ago.  She reports that over the past   almost 2 weeks she has had some loose stool and diarrhea.  She was   trying to get by with some over-the-counter stuff.  She subsequently   developed some urinary symptoms on April 14, 2009, and called her   primary care office who called in some antibiotics consisting of both   Cipro and Flagyl for her.  However, again she does state the diarrhea   began before she was on these medications.  Then just a few nights ago   early in the a.m. of April 18, 2009, the patient states she was   awoken by some substernal chest pressure.  She states this was very   severe in nature and felt like something was pressuring on her chest.   Her family was concerned and subsequently called EMS to bring her to the   hospital.  Upon our evaluation, she was apparently found to have an   elevated white blood cell count of approximately 14,000.  Her continuing   workup included cardiac evaluation which essentially did not find any   significant abnormalities.  She then subsequently had a little bit upper   abdominal workup including an ultrasound and subsequent CT scan.  The   ultrasound apparently showed some gallstones with a little bit of   fullness of the common bile duct dilated to 14 mm;  however, no   definitive obstructing choledocholithiasis was noted.  CT scan ordered   the following day did show that the extrahepatic biliary ductal   dilatation was actually slightly decreased from the previous day's   ultrasound.  Otherwise, gallstones were seen but no definite   pericholecystic fluid or inflammatory changes were noted.  The patient's   labs aside from her white blood cell count were normal including her   LFTs.  She had since been consulted on by Gastroenterology, who   continued part of this workup.  This included an upper endoscopy and   colonoscopy which showed no gross findings with the exception of a   little bit of hiatal hernia.  Since the majority of her workup has been   negative with the exception of cholelithiasis, the  patient's diarrhea   has substantially improved.  There was high concern that this may have   been C. diff colitis caused by antibiotic use; however, her stool has   come back negative x3 for C. diff toxicity.  Therefore, biliary colic as   a source of her abdominal discomfort has not completely been ruled out   and subsequent surgical consult has been called.  The patient currently   is tolerating a regular diet, not having any nausea or vomiting;   however, she does state she still has some mild discomfort in the   epigastric region with occasional discomfort in the right upper   quadrant.  She has not had any fevers or chills since being here on   admission.  Again, her white blood cell count has resolved.  We have   been asked to see this patient for possible surgical intervention.      PAST MEDICAL HISTORY:  Hypertension, irritable bowel syndrome,   hyperlipidemia, osteoporosis, and hernia.      SURGICAL HISTORY:  Hysterectomy, laminectomy, and bladder tack x2 due to   a cystocele.      SOCIAL HISTORY:  The patient currently lives at home.  She does not   drive.  Her family lives close by, and she contacts them with any   issues.   She denies any alcohol, tobacco, or illicit drug use.      Family history is pertinent for coronary artery disease and cancer.      MEDICATIONS:  Please see the current list on the medication   reconciliation.      Her allergies include SULFA and AUGMENTIN.      REVIEW OF SYSTEMS:  Please see history of present illness for pertinent   findings, otherwise negative.      PHYSICAL EXAMINATION:  GENERAL:  A 77 year old white female who does not   appear in acute distress.  She was sitting on the edge of bed without   any complaints at the time of exam.   VITAL SIGNS:  Temperature of 98.8, blood pressure 187/84, heart rate of   78, respiratory rate of 24.   ENT:  Unremarkable.   NECK:  Supple without lymphadenopathy.  Trachea is midline.  No   thyromegaly or masses.   LUNGS:  Clear to auscultation.  No wheezes, rhonchi, or rales.  Normal   respiratory effort without use of accessory muscles.   HEART:  Regular rate and rhythm.  No murmurs, gallops, or rubs.   Carotids are 2+ and brisk without bruit.  Peripheral pulse is 1+ and   symmetrical.  No lower extremity edema is appreciated.   ABDOMEN:  A soft, nondistended abdomen.  No major surgical scars noted.   The patient is mildly ever so tender in the right upper quadrant.  She   has no musculoskeletal or chest wall tenderness or gross deficit.  She   has normal bowel sounds x4 quadrants.  No organomegaly or masses are   noted.  No hernias are detectable.   RECTAL:  Deferred at present time.   GENITOURINARY:  Deferred at present time.   EXTREMITIES:  The patient has full active range of motion without   crepitus or pain.  Normal muscle strength and tone 5/5.  Gait is within   normal limits.   SKIN:  Warm and dry.  Good turgor.  No rashes or lesions or nodules seen   on exam.   NEUROLOGIC:  The patient is alert and  oriented x3.  Cranial nerves II   through XII grossly intact.      DIAGNOSTICS:  CBC today reveals a blood cell count of  9.4, hemoglobin of   11.4, hematocrit of 33.6, platelet count of 270.  Metabolic panel shows   a sodium of 137, potassium of 3.7, chloride of 104, CO2 of 25, BUN of 4,   creatinine of 0.65, glucose of 97.  Most recent liver enzymes were   within normal limits with a total bili of 0.8, alkaline phosphatase of   57, AST 15, ALT of 12.  C. diff tox is negative x3 separate stool   samples.  Blood cultures are negative x2 so far.      IMAGING:  Again, ultrasound of the abdomen shows gallbladder with mild   distention with lingering gallstones.  No definite wall thickening or   pericholecystic fluid is noted.  The common bile duct was noted to be   slightly dilated at 14 mm but no definite obstructive process.  CT scan   of the abdomen and pelvis again showed a hiatal hernia identified.  The   gallstones were also identified; however, the degree of extrahepatic   ductal dilatation was slightly less than the ultrasound noted.   Incidentally, there was also some evidence of wall thickening of the   rectosigmoid colon which could be related to distal colitis.      PROCEDURES:  The patient has had an upper endoscopy which showed   evidence of the hiatal hernia, otherwise no gross findings were seen.   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      IMPRESSION:  Upper abdominal/epigastric pain.  This etiology is unclear,   but the patient does have cholelithiasis.  Question whether or not the   patient would benefit from laparoscopic cholecystectomy.  I have   discussed with Dr. David Stall that upon admission the patient had a   full cardiac workup and did not show any evidence of cardiac source.  I   feel the patient would be medically clear to proceed with surgery and   that decision was made; however, again clinical picture is not  descended   for biliary colic and would not be able to guarantee that the patient   would have resolution of her symptoms with cholecystectomy.  She was a   little bit confused as to the possibility of surgery during this   admission and requested that we come back to speak while her family was   here.  I will discuss this case   with Dr. Johna Sheriff, and he will evaluate the patient and make further   decision based on that.  We will otherwise come back and see her and be   happy to follow along with you.      Thank you so much for the opportunity to consult on this patient.                  Brayton El, PA-C         ______________________________   Lorne Skeens. Hoxworth, M.D.         KB/MEDQ  D:  04/22/2009  T:  04/23/2009  Job:  [id number]      cc:   Wilhemina Bonito. Marina Goodell, MD   Marinda Elk, M.D.

## 2010-04-02 NOTE — Progress Notes (Signed)
Summary: Simvastatin, Fexofenadine and Nexium sent to [facility]  Phone Note Call from Patient   Caller: Patient Call For: nadel Summary of Call: need fimvastatin ,fexonadine hcl nexium40mg  faxed to [facility] Initial call taken by: Rickard Patience,  March 05, 2009 3:54 PM    Prescriptions: NEXIUM 40 MG  CPDR (ESOMEPRAZOLE MAGNESIUM) take 1 cap by mouth once daily...  #90 x 1   Entered by:   Michel Bickers CMA   Authorized by:   Michele Mcalpine MD   Signed by:   Michel Bickers CMA on 03/05/2009   Method used:   Faxed to ...       [facility]* (mail-order)             ,          Ph: [phone]       Fax: [phone]   RxID:   [id number] SIMVASTATIN 40 MG TABS (SIMVASTATIN) take 1 tab by mouth at bedtime  #90 x 1   Entered by:   Michel Bickers CMA   Authorized by:   Michele Mcalpine MD   Signed by:   Michel Bickers CMA on 03/05/2009   Method used:   Faxed to ...       [facility]* (mail-order)             ,          Ph: [phone]       Fax: [phone]   RxID:   [id number] ALLEGRA 180 MG TABS (FEXOFENADINE HCL) take 1 tab by mouth once daily as needed for allergies  #90 x 1   Entered by:   Michel Bickers CMA   Authorized by:   Michele Mcalpine MD   Signed by:   Michel Bickers CMA on 03/05/2009   Method used:   Faxed to ...       [facility]* (mail-order)             ,          Ph: [phone]       Fax: [phone]   RxID:   [id number]

## 2010-04-02 NOTE — Progress Notes (Signed)
Summary: cough/ low grade fever  Phone Note Call from Patient   Caller: Patient Call For: nadel Summary of Call:  completed augmentin mucinex  and cough syrup [facility] [address] Initial call taken by: Rickard Patience,  April 07, 2009 3:48 PM  Follow-up for Phone Call        told pt to go back on the mucinex and delsym, also to add tylenol every 4 hrs as needed for fever and achy feeling, if not better by thursday am to give [place] a call back to be worked into schedule Follow-up by: Philipp Deputy CMA,  April 07, 2009 4:44 PM

## 2010-04-02 NOTE — Letter (Signed)
Summary: [facility]   Pain Management   Imported By: Maryln Gottron 03/04/2009 10:14:18  _____________________________________________________________________  External Attachment:    Type:   Image     Comment:   External Document

## 2010-04-02 NOTE — Progress Notes (Signed)
Summary: blood in urine  Phone Note Call from Patient Call back at Home Phone [phone]   Caller: Patient Call For: nadel Reason for Call: Talk to Nurse Summary of Call: pt has had chills, getting cold, just went to bathroom and is getting some blood in her urine.  Doesn't hurt when she urinates, no itching.  Please respond. Initial call taken by: Eugene Gavia,  April 17, 2009 4:15 PM  Follow-up for Phone Call        Pt c/o chills x 3 days. She staes she urinated yesterday and thought she noticed blood in the toilet then but wasn't sure so today she urinated in a cup and states it was mostly red. Pt denies fever, back pain, abdominal pain, burning or pain with urination. Please advise.Carron Curie CMA  April 17, 2009 4:34 PM   per SN add cipro 250mg  one by mouth two times a day #14, increased fluids, and take tylenol for the chills. if no better next week then needs OV.  Carron Curie CMA  April 17, 2009 4:47 PM  pt advised of recs. rx sent. Carron Curie CMA  April 17, 2009 4:52 PM     New/Updated Medications: CIPRO 250 MG TABS (CIPROFLOXACIN HCL) Take 1 tablet by mouth two times a day Prescriptions: CIPRO 250 MG TABS (CIPROFLOXACIN HCL) Take 1 tablet by mouth two times a day  #14 x 0   Entered by:   Carron Curie CMA   Authorized by:   Michele Mcalpine MD   Signed by:   Carron Curie CMA on 04/17/2009   Method used:   Electronically to        [facility] [phone]* (retail)       [address] [place], [place]  [address]       Ph: [phone] or [phone]       Fax: [phone]   RxID:   [id number]

## 2010-04-02 NOTE — Procedures (Signed)
Summary: FLEX SIG   Flexible Sigmoidoscopy  Procedure date:  04/20/2009  Findings:      Results: Hemorrhoids.  FLEXIBLE SIGMOIDOSCOPY PROCEDURE REPORT  PATIENT:  Cathy [name], Cathy [name]  MR#:  [id number] BIRTHDATE:   07-26-1927, 81 yrs. old   GENDER:   female ENDOSCOPIST:   Jeani Hawking, MD PROCEDURE DATE:  04/20/2009 PROCEDURE:  Flexible Sigmoidoscopy with biopsy ASA CLASS:   Class III INDICATIONS: Diarrhea MEDICATIONS:   None  DESCRIPTION OF PROCEDURE:   After the risks benefits and alternatives of the procedure were thoroughly explained, informed consent was obtained.  Digital rectal exam was performed and revealed no abnormalities.   The EG-[id number] ([id number]) endoscope was introduced through the anus and advanced to the sigmoid colon, without limitations.  The quality of the prep was good.  The instrument was then slowly withdrawn as the mucosa was fully examined. <<PROCEDUREIMAGES>>  <<OLD IMAGES>>  FINDINGS:  In the sigmoid colon there was evidence of a mild colitis. Cold biopsies were obtained. Scattered diverticula were also identified.   Retroflexed views in the rectum revealed medium hemorrhoids.    The scope was then withdrawn from the patient and the procedure terminated.  COMPLICATIONS:   None  ENDOSCOPIC IMPRESSION:  1) Mild Eythema  2) Medium hemorrhoids  3) Diverticula RECOMMENDATIONS:  1) Await biopsy results and stool aspirate.  _______________________________ Jeani Hawking, MD   CC: Michele Mcalpine, MD

## 2010-04-21 ENCOUNTER — Encounter: Payer: Self-pay | Admitting: Pulmonary Disease

## 2010-04-21 ENCOUNTER — Other Ambulatory Visit: Payer: Medicare Other

## 2010-04-21 ENCOUNTER — Ambulatory Visit (INDEPENDENT_AMBULATORY_CARE_PROVIDER_SITE_OTHER): Payer: Medicare Other | Admitting: Pulmonary Disease

## 2010-04-21 ENCOUNTER — Other Ambulatory Visit: Payer: Self-pay | Admitting: Pulmonary Disease

## 2010-04-21 DIAGNOSIS — M545 Low back pain, unspecified: Secondary | ICD-10-CM

## 2010-04-21 DIAGNOSIS — I10 Essential (primary) hypertension: Secondary | ICD-10-CM

## 2010-04-21 DIAGNOSIS — D638 Anemia in other chronic diseases classified elsewhere: Secondary | ICD-10-CM

## 2010-04-21 DIAGNOSIS — I872 Venous insufficiency (chronic) (peripheral): Secondary | ICD-10-CM

## 2010-04-21 DIAGNOSIS — M949 Disorder of cartilage, unspecified: Secondary | ICD-10-CM

## 2010-04-21 DIAGNOSIS — R32 Unspecified urinary incontinence: Secondary | ICD-10-CM

## 2010-04-21 DIAGNOSIS — K5730 Diverticulosis of large intestine without perforation or abscess without bleeding: Secondary | ICD-10-CM

## 2010-04-21 DIAGNOSIS — M899 Disorder of bone, unspecified: Secondary | ICD-10-CM

## 2010-04-21 DIAGNOSIS — F411 Generalized anxiety disorder: Secondary | ICD-10-CM

## 2010-04-21 DIAGNOSIS — K449 Diaphragmatic hernia without obstruction or gangrene: Secondary | ICD-10-CM

## 2010-04-21 DIAGNOSIS — M1990 Unspecified osteoarthritis, unspecified site: Secondary | ICD-10-CM

## 2010-04-21 DIAGNOSIS — E7800 Pure hypercholesterolemia, unspecified: Secondary | ICD-10-CM

## 2010-04-21 DIAGNOSIS — K589 Irritable bowel syndrome without diarrhea: Secondary | ICD-10-CM

## 2010-04-21 LAB — CBC WITH DIFFERENTIAL/PLATELET
Basophils Absolute: 0 10*3/uL (ref 0.0–0.1)
Eosinophils Absolute: 0.1 10*3/uL (ref 0.0–0.7)
Eosinophils Relative: 1.1 % (ref 0.0–5.0)
MCHC: 34 g/dL (ref 30.0–36.0)
MCV: 91.3 fl (ref 78.0–100.0)
Monocytes Absolute: 0.6 10*3/uL (ref 0.1–1.0)
Neutrophils Relative %: 79 % — ABNORMAL HIGH (ref 43.0–77.0)
Platelets: 212 10*3/uL (ref 150.0–400.0)
RDW: 13.8 % (ref 11.5–14.6)
WBC: 9.1 10*3/uL (ref 4.5–10.5)

## 2010-04-21 LAB — IBC PANEL
Iron: 20 ug/dL — ABNORMAL LOW (ref 42–145)
Saturation Ratios: 8.2 % — ABNORMAL LOW (ref 20.0–50.0)
Transferrin: 174.6 mg/dL — ABNORMAL LOW (ref 212.0–360.0)

## 2010-04-21 LAB — HEPATIC FUNCTION PANEL
ALT: 10 U/L (ref 0–35)
AST: 16 U/L (ref 0–37)
Alkaline Phosphatase: 52 U/L (ref 39–117)
Total Bilirubin: 0.3 mg/dL (ref 0.3–1.2)

## 2010-04-21 LAB — BASIC METABOLIC PANEL
BUN: 9 mg/dL (ref 6–23)
Chloride: 105 mEq/L (ref 96–112)
Potassium: 4.3 mEq/L (ref 3.5–5.1)

## 2010-04-21 LAB — VITAMIN B12: Vitamin B-12: 139 pg/mL — ABNORMAL LOW (ref 211–911)

## 2010-04-21 LAB — LIPID PANEL
Cholesterol: 160 mg/dL (ref 0–200)
Triglycerides: 61 mg/dL (ref 0.0–149.0)

## 2010-04-29 ENCOUNTER — Telehealth: Payer: Self-pay | Admitting: Pulmonary Disease

## 2010-04-29 LAB — CONVERTED CEMR LAB: Vit D, 25-Hydroxy: 39 ng/mL (ref 30–89)

## 2010-05-03 DIAGNOSIS — D649 Anemia, unspecified: Secondary | ICD-10-CM | POA: Insufficient documentation

## 2010-05-03 DIAGNOSIS — D509 Iron deficiency anemia, unspecified: Secondary | ICD-10-CM | POA: Insufficient documentation

## 2010-05-03 DIAGNOSIS — E538 Deficiency of other specified B group vitamins: Secondary | ICD-10-CM | POA: Insufficient documentation

## 2010-05-07 NOTE — Progress Notes (Signed)
Summary: lab resutls  Phone Note Call from Patient   Caller: daughter/cookie(mary) Call For: Kriste Basque Summary of Call: Patients daughter phoned stated that they tried to get Ms Parkers lab results from 04/21/10 and the recording stated that there were no results there. Cookie Corrie Dandy) can be reached at work  [phone] Initial call taken by: Vedia Coffer,  April 29, 2010 8:43 AM  Follow-up for Phone Call        spoke with pt daughter Corrie Dandy and advised of recs per appends. Carron Curie CMA  April 29, 2010 11:12 AM

## 2010-05-11 LAB — DIFFERENTIAL
Basophils Absolute: 0 10*3/uL (ref 0.0–0.1)
Basophils Relative: 0 % (ref 0–1)
Eosinophils Absolute: 0.1 10*3/uL (ref 0.0–0.7)
Eosinophils Relative: 1 % (ref 0–5)
Lymphs Abs: 1.5 10*3/uL (ref 0.7–4.0)
Neutrophils Relative %: 66 % (ref 43–77)

## 2010-05-11 LAB — COMPREHENSIVE METABOLIC PANEL
ALT: 28 U/L (ref 0–35)
AST: 18 U/L (ref 0–37)
CO2: 27 mEq/L (ref 19–32)
Calcium: 9.3 mg/dL (ref 8.4–10.5)
Chloride: 107 mEq/L (ref 96–112)
GFR calc Af Amer: >60 mL/min (ref >60)
GFR calc non Af Amer: 57 mL/min — ABNORMAL LOW (ref >60)
Glucose, Bld: 84 mg/dL (ref 70–99)
Sodium: 140 mEq/L (ref 135–145)
Total Bilirubin: 0.4 mg/dL (ref 0.3–1.2)

## 2010-05-11 LAB — CBC
HCT: 36.9 % (ref 36.0–46.0)
Hemoglobin: 12 g/dL (ref 12.0–15.0)
MCH: 30.5 pg (ref 26.0–34.0)
MCHC: 32.5 g/dL (ref 30.0–36.0)
RBC: 3.93 MIL/uL (ref 3.87–5.11)

## 2010-05-11 LAB — URINALYSIS, ROUTINE W REFLEX MICROSCOPIC
Bilirubin Urine: NEGATIVE
Glucose, UA: NEGATIVE mg/dL
Hgb urine dipstick: NEGATIVE
Protein, ur: NEGATIVE mg/dL
Urobilinogen, UA: 0.2 mg/dL (ref 0.0–1.0)

## 2010-05-11 LAB — SURGICAL PCR SCREEN: Staphylococcus aureus: NEGATIVE

## 2010-05-11 LAB — PROTIME-INR
INR: 1.04 (ref 0.00–1.49)
Prothrombin Time: 13.8 seconds (ref 11.6–15.2)

## 2010-05-12 NOTE — Assessment & Plan Note (Signed)
Summary: 4 month /mbw   Primary Care Provider:  Alroy Dust, MD  CC:  4 month ROV & review of mult medical problems....  History of Present Illness: 77 y/o WF here for a follow up visit... she has multiple medical problems including HBP, Hyperchol, HH/ Divertics/ IBS followed by DrDBrodie; hx UTIs/ bladder problems followed by DrTannenbaum; DJD/ LBP/ Osteoporosis followed by DrBeane for Ortho; chronic pain syndrome managed by DrBartko; Anxiety, mild Anemia...   ~  August 18, 2009:  she persists w/ c/o back pain> DrBeane, DrRamos, DrJones, DrBartko- on Neurontin, Celebrex, Flector, & Vicodin (she had recent shot from [employer])... she was c/o dysphagia & had EGD 6/11 by DrDBrodie- +HH, dilated, "it helped"... recent bronchitic exac Rx'd w/ Augmentin, Mucinex, Delsym...   ~  December 16, 2009:  her CC has been her bladder- urinary incont, eval & Rx by DrTennenbaum, no help from Vesicare/ Gelnique, now getting accupuncture... asking for second opinion & I directed her to her GYN for this...  also concerned about her Ortho troubles- DrBeane did right knee injection, but she needs TKR;  ses DrBartko for pain management on Oxycodone, Flector, Neurontin, etc...  despite her stress level she refuses nerve pills...   ~  April 21, 2010:  she had right TKR 1/12 by DrBeane w/ "rough" post op course- in [place] [facility] nurse) for rehab;  she wants to go back on Celebrex & we will write this rx, but she is warned to watch for GI problems & we will monitor renal function etc; she will continue rehab from [facility] & pain management from [facility]...    She notes a mild URI w. scratchy throat & drainage> rec to use OTC Mucinex, fluids, & Chlorseptic spray vs lozenges;  BP remains stable on diet rx alone;  Chol looks good on Simva40;  GI stable on Nexium daily;  she reports still getting accupuncture for her bladder problems & now on Toviaz as well & improved...    Labs today reveal mild anemia Hg=11.8 w/ low Fe=20 (8%sat) and  B12 deficient=139;  she is rec to increase her FeSO4 to Bid w/ VitC 500, and start on Vit B12 shots monthly...  Current Meds:  ALLEGRA 180 MG TABS (FEXOFENADINE HCL) take 1 tab by mouth once daily as needed for allergies ASPIRIN ADULT LOW STRENGTH 81 MG TBEC (ASPIRIN) Take 1 tablet by mouth daily SIMVASTATIN 40 MG TABS (SIMVASTATIN) take 1 tab by mouth at bedtime NEXIUM 40 MG  CPDR (ESOMEPRAZOLE MAGNESIUM) take 1 cap by mouth once daily... TOVIAZ 4 MG XR24H-TAB (FESOTERODINE FUMARATE) take 1 tab by mouth once daily per DrTannenbaum OXYCODONE-ACETAMINOPHEN 5-325 MG TABS (OXYCODONE-ACETAMINOPHEN) take 1 tab by mouth every 6H as needed for pain... NEURONTIN 300 MG CAPS (GABAPENTIN) take 1 cap by mouth three times a day as directed... FLECTOR 1.3 % PTCH (DICLOFENAC EPOLAMINE) apply as directed by DrBartko... CELEBREX 200 MG CAPS (CELECOXIB) 1 by mouth once daily as needed joint pain ALENDRONATE SODIUM 70 MG TABS (ALENDRONATE SODIUM) take 1 tab by mouth each week... OSCAL 500/200 D-3 500-200 MG-UNIT TABS (CALCIUM-VITAMIN D) Take 1 tablet by mouth once a day VITAMIN D 1000 UNIT  CAPS (CHOLECALCIFEROL) 1 by mouth once daily FEOSOL 200 MG  TABS (FERROUS SULFATE) 1 by mouth once daily    Preventive Screening-Counseling & Management  Alcohol-Tobacco     Smoking Status: quit > 6 months     Packs/Day: 0.5     Year Quit: 50 yrs ago     Pack years:  10 years  Allergies: 1)  ! Sulfa 2)  ! Augmentin  Comments:  Nurse/Medical Assistant: The patient's medications and allergies were reviewed with the patient and were updated in the Medication and Allergy Lists.  Past History:  Past Medical History: ALLERGY (ICD-995.3) HYPERTENSION (ICD-401.9) VENOUS INSUFFICIENCY (ICD-459.81) HYPERCHOLESTEROLEMIA (ICD-272.0) HIATAL HERNIA (ICD-553.3) DYSPHAGIA (ICD-787.29) DIVERTICULOSIS OF COLON (ICD-562.10) IRRITABLE BOWEL SYNDROME (ICD-564.1) COLITIS - LYMPHOCYTIC COLITIS FEB. 2011 (ICD-558.9) UTI  (ICD-599.0) URINARY INCONTINENCE (ICD-788.30) DEGENERATIVE JOINT DISEASE (ICD-715.90) BACK PAIN, LUMBAR (ICD-724.2) OSTEOPENIA (ICD-733.90) ANXIETY (ICD-300.00) ANEMIA (ICD-285.9) IRON DEFICIENCY (ICD-280.9) VITAMIN B12 DEFICIENCY (ICD-266.2)  Past Surgical History: S/P cataract surgery S/P hysterectomy S/P decompressive lumbar laminectomy at L2-3, L3-4, and L4-5 by DrJones 5/10 S/P lap chole 2/11 by DrHoxsworth  Family History: Reviewed history from 07/29/2009 and no changes required. Cancer- lung daughter, brother DM - son CAD - son, mother Mother-CHF Family History of Colon Cancer: brother cervical cancer: daughter No FH of Colon Cancer:  Social History: Reviewed history from 05/26/2009 and no changes required. former smoker -quit age 27 smoked  ~10 yr 1/2 d.  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  The patient denies anorexia, fever, weight loss, weight gain, vision loss, hoarseness, chest pain, syncope, peripheral edema, prolonged cough, headaches, hemoptysis, abdominal pain, melena, hematochezia, severe indigestion/heartburn, hematuria, suspicious skin lesions, transient blindness, depression, unusual weight change, abnormal bleeding, enlarged lymph nodes, and angioedema.    Vital Signs:  Patient profile:   77 year old female Height:      59 inches Weight:      129.25 pounds BMI:     26.20 O2 Sat:      98 % on Room air Temp:     98.0 degrees F oral Pulse rate:   73 / minute BP sitting:   128 / 70  (right arm) Cuff size:   regular  Vitals Entered By: Randell Loop CMA (April 21, 2010 12:05 PM)  O2 Sat at Rest %:  98 O2 Flow:  Room air CC: 4 month ROV & review of mult medical problems... Is Patient Diabetic? No Pain Assessment Patient in pain?  yes      Onset of pain  right knee replacement still hurting Comments meds updated today with pt   Physical Exam  Additional Exam:  WD, WN, 77 y/o WF in NAD... GENERAL:  Alert & oriented; pleasant & cooperative... HEENT:  [place]/AT, EOM-full, PERRLA, EACs-clear, TMs-wnl, NOSE-clear, THROAT-clear & wnl. NECK:  Supple w/ fairROM; no JVD; normal carotid impulses w/o bruits; no thyromegaly or nodules palpated; no lymphadenopathy. CHEST:  Clear to P & A; without wheezes/ rales/ or rhonchi heard... HEART:  Regular Rhythm; without murmurs/ rubs/ or gallops detected... ABDOMEN:  Soft & sl tenderness, normal bowel sounds; no organomegaly or masses palpated...no guarding or rebound.  BACK:  s/p lumbar lam & scoliosis/ kyphosis EXT: without deformities, mild arthritic changes; no varicose veins/ +venous insuffic/ tr-1+ edema... NEURO:  CN's intact; no focal neuro deficits... DERM:  No lesions noted; no rash etc...    Impression & Recommendations:  Problem # 1:  DEGENERATIVE JOINT DISEASE (ICD-715.90) She is s/p right TKR by DrBeane... getting on-oing rehab as well w/ continued pain in right knee area... continue rx... She requests rx for Celebrex but she is  cautioned about GI & renal; side effects & monitoring required... Her updated medication list for this problem includes:    Aspirin Adult Low Strength 81 Mg Tbec (Aspirin) .[name]... Take 1 tablet by mouth daily    Oxycodone-acetaminophen 5-325 Mg Tabs (Oxycodone-acetaminophen) .[name]... Take 1 tab by mouth every 6h as needed for pain...    Celebrex 200 Mg Caps (Celecoxib) .[name]... 1 by mouth once daily as needed joint pain  Problem # 2:  BACK PAIN, LUMBAR (ICD-724.2) She has a chr pain syndrome followed by DrBartko on mult meds... Her updated medication list for this problem includes:    Aspirin Adult Low Strength 81 Mg Tbec (Aspirin) .[name]... Take 1 tablet by mouth daily    Oxycodone-acetaminophen 5-325 Mg Tabs (Oxycodone-acetaminophen) .[name]... Take 1 tab  by mouth every 6h as needed for pain...    Celebrex 200 Mg Caps (Celecoxib) .[name]... 1 by mouth once daily as needed joint pain  Problem # 3:  DYSPHAGIA (ICD-787.29) GI is stable, followed by DrDBrodie w/ EGD & dil 5/11... continue Nexium daily...  Problem # 4:  UTI (ICD-599.0) GU followed by DrTannenbaum on Toviaz & accupuncture rx... Her updated medication list for this problem includes:    Toviaz 4 Mg Xr24h-tab (Fesoterodine fumarate) .[name]... Take 1 tab by mouth once daily per drtannenbaum  Problem # 5:  ANEMIA (ICD-285.9) She is both Iron defic & B12 defic on labs...  REC> incr Fe to Bid w/ VitC, and start B12 1058mcg/mo Subcutaneously & stay on this... Her updated medication list for this problem includes:    Feosol 200 Mg Tabs (Ferrous sulfate) .[name]... Take 1 tab by mouth two times a day w/ vitc500...  Problem # 6:  OTHER MEDICAL PROBLEMS AS NOTED>>>  Complete Medication List: 1)  Allegra 180 Mg Tabs (Fexofenadine hcl) .... Take 1 tab by mouth once daily as needed for allergies 2)  Aspirin Adult Low Strength 81 Mg Tbec (Aspirin) .... Take 1 tablet by mouth daily 3)  Simvastatin 40 Mg Tabs (Simvastatin) .... Take 1 tab by mouth at bedtime 4)  Nexium 40 Mg Cpdr (Esomeprazole magnesium) .... Take 1 cap by mouth once daily.Marland KitchenMarland Kitchen 5)  Toviaz 4 Mg Xr24h-tab (Fesoterodine fumarate) .... Take 1 tab by mouth once daily per drtannenbaum 6)  Oxycodone-acetaminophen 5-325 Mg Tabs (Oxycodone-acetaminophen) .... Take 1 tab by mouth every 6h as needed for pain.Marland KitchenMarland Kitchen 7)  Neurontin 300 Mg Caps (Gabapentin) .... Take 1 cap by mouth three times a day as directed... 8)  Flector 1.3 % Ptch (Diclofenac epolamine) .... Apply as directed by drbartko... 9)  Celebrex 200 Mg Caps (Celecoxib) .[name].. 1 by mouth once daily as needed joint pain 10)  Alendronate Sodium 70 Mg Tabs (Alendronate sodium) .... Take 1 tab by mouth each week... 11)  Oscal 500/200 D-3 500-200 Mg-unit Tabs (Calcium-vitamin d) .... Take 1 tablet by mouth  once a day 12)  Vitamin D 1000 Unit Caps (Cholecalciferol) .[name].. 1 by mouth once daily 13)  Feosol 200 Mg Tabs (Ferrous sulfate) .... Take 1 tab by mouth two times a day w/ vitc500... 14)  Vit B 12 Shots...  .... Admin vitb12 monthly subcutaneously...  Other Orders: T-Vitamin D (25-Hydroxy) [phone]) TLB-BMP (Basic Metabolic Panel-BMET) (80048-METABOL) TLB-Hepatic/Liver Function Pnl (80076-HEPATIC) TLB-CBC Platelet - w/Differential (85025-CBCD) TLB-Lipid Panel (80061-LIPID) TLB-TSH (Thyroid Stimulating Hormone) (84443-TSH) TLB-IBC Pnl (Iron/FE;Transferrin) (83550-IBC) TLB-B12, Serum-Total ONLY (57846-N62)  Patient Instructions: 1)  Today we updated your med list- see below.... 2)  We refilled your meds per request..[name] 3)  Today we did your follow up FASTING blood work... please call the "phone tree" in a few days for your lab results.Marland KitchenMarland Kitchen 4)  Continue your pain management w/ DrBartko, and your Ortho rehab w/ DrBeane... 5)  Call for any questions.Marland KitchenMarland Kitchen 6)  Please schedule a follow-up appointment in 4 months. Prescriptions: ALENDRONATE SODIUM 70 MG TABS (ALENDRONATE SODIUM) take 1 tab by mouth each week...  #12 x 3   Entered and Authorized by:   Michele Mcalpine MD   Signed by:   Michele Mcalpine MD on 04/21/2010   Method used:   Print then Give to Patient   RxID:   [id number] CELEBREX 200 MG CAPS (CELECOXIB) 1 by mouth once daily as needed joint pain  #90 x 3   Entered and Authorized by:   Michele Mcalpine MD   Signed by:   Michele Mcalpine MD on 04/21/2010   Method used:   Print then Give to Patient   RxID:   [id number] NEXIUM 40 MG  CPDR (ESOMEPRAZOLE MAGNESIUM) take 1 cap by mouth once daily...  #90 x 3   Entered and Authorized by:   Michele Mcalpine MD   Signed by:   Michele Mcalpine MD on 04/21/2010   Method used:   Print then Give to Patient   RxID:   [id number] SIMVASTATIN 40 MG TABS (SIMVASTATIN) take 1 tab by mouth at bedtime  #90 x 3   Entered and Authorized by:    Michele Mcalpine MD   Signed by:   Michele Mcalpine MD on 04/21/2010   Method used:   Print then Give to Patient   RxID:   [id number] ALLEGRA 180 MG TABS (FEXOFENADINE HCL) take 1 tab by mouth once daily as needed for allergies  #90 x 3   Entered and Authorized by:   Michele Mcalpine MD   Signed by:   Michele Mcalpine MD on 04/21/2010   Method used:   Print then Give to Patient   RxID:   [id number]

## 2010-05-20 LAB — HEPATIC FUNCTION PANEL
ALT: 13 U/L (ref 0–35)
AST: 14 U/L (ref 0–37)
Alkaline Phosphatase: 59 U/L (ref 39–117)
Total Protein: 5.3 g/dL — ABNORMAL LOW (ref 6.0–8.3)

## 2010-05-20 LAB — URINE MICROSCOPIC-ADD ON

## 2010-05-20 LAB — CLOSTRIDIUM DIFFICILE EIA
C difficile Toxins A+B, EIA: NEGATIVE
C difficile Toxins A+B, EIA: NEGATIVE
C difficile Toxins A+B, EIA: NEGATIVE

## 2010-05-20 LAB — CBC
HCT: 27.2 % — ABNORMAL LOW (ref 36.0–46.0)
HCT: 33.6 % — ABNORMAL LOW (ref 36.0–46.0)
Hemoglobin: 10.1 g/dL — ABNORMAL LOW (ref 12.0–15.0)
Hemoglobin: 10.5 g/dL — ABNORMAL LOW (ref 12.0–15.0)
Hemoglobin: 9.3 g/dL — ABNORMAL LOW (ref 12.0–15.0)
MCHC: 33.8 g/dL (ref 30.0–36.0)
MCV: 92.4 fL (ref 78.0–100.0)
Platelets: 191 10*3/uL (ref 150–400)
Platelets: 270 10*3/uL (ref 150–400)
RBC: 2.95 MIL/uL — ABNORMAL LOW (ref 3.87–5.11)
RBC: 3.31 MIL/uL — ABNORMAL LOW (ref 3.87–5.11)
RDW: 13.3 % (ref 11.5–15.5)
RDW: 13.5 % (ref 11.5–15.5)
WBC: 13.7 10*3/uL — ABNORMAL HIGH (ref 4.0–10.5)
WBC: 14.3 10*3/uL — ABNORMAL HIGH (ref 4.0–10.5)

## 2010-05-20 LAB — DIFFERENTIAL
Basophils Absolute: 0 10*3/uL (ref 0.0–0.1)
Basophils Relative: 0 % (ref 0–1)
Eosinophils Absolute: 0 10*3/uL (ref 0.0–0.7)
Monocytes Relative: 10 % (ref 3–12)
Neutro Abs: 11.6 10*3/uL — ABNORMAL HIGH (ref 1.7–7.7)
Neutrophils Relative %: 81 % — ABNORMAL HIGH (ref 43–77)

## 2010-05-20 LAB — CK TOTAL AND CKMB (NOT AT ARMC)
CK, MB: 1.4 ng/mL (ref 0.3–4.0)
Relative Index: INVALID (ref 0.0–2.5)
Total CK: 28 U/L (ref 7–177)

## 2010-05-20 LAB — BASIC METABOLIC PANEL
BUN: 1 mg/dL — ABNORMAL LOW (ref 6–23)
BUN: 12 mg/dL (ref 6–23)
BUN: 4 mg/dL — ABNORMAL LOW (ref 6–23)
CO2: 24 mEq/L (ref 19–32)
Calcium: 7.9 mg/dL — ABNORMAL LOW (ref 8.4–10.5)
Calcium: 8.4 mg/dL (ref 8.4–10.5)
Creatinine, Ser: 0.65 mg/dL (ref 0.4–1.2)
Creatinine, Ser: 0.81 mg/dL (ref 0.4–1.2)
GFR calc Af Amer: >60 mL/min (ref >60)
GFR calc non Af Amer: >60 mL/min (ref >60)
GFR calc non Af Amer: >60 mL/min (ref >60)
GFR calc non Af Amer: >60 mL/min (ref >60)
Glucose, Bld: 97 mg/dL (ref 70–99)
Glucose, Bld: 99 mg/dL (ref 70–99)
Potassium: 3.3 mEq/L — ABNORMAL LOW (ref 3.5–5.1)
Sodium: 140 mEq/L (ref 135–145)
Sodium: 141 mEq/L (ref 135–145)

## 2010-05-20 LAB — COMPREHENSIVE METABOLIC PANEL
ALT: 12 U/L (ref 0–35)
ALT: 12 U/L (ref 0–35)
AST: 15 U/L (ref 0–37)
Alkaline Phosphatase: 53 U/L (ref 39–117)
BUN: 11 mg/dL (ref 6–23)
CO2: 20 mEq/L (ref 19–32)
CO2: 24 mEq/L (ref 19–32)
Chloride: 108 mEq/L (ref 96–112)
Chloride: 108 mEq/L (ref 96–112)
GFR calc Af Amer: >60 mL/min (ref >60)
GFR calc non Af Amer: 57 mL/min — ABNORMAL LOW (ref >60)
GFR calc non Af Amer: >60 mL/min (ref >60)
Glucose, Bld: 75 mg/dL (ref 70–99)
Glucose, Bld: 90 mg/dL (ref 70–99)
Potassium: 3.8 mEq/L (ref 3.5–5.1)
Sodium: 139 mEq/L (ref 135–145)
Sodium: 139 mEq/L (ref 135–145)
Total Bilirubin: 0.7 mg/dL (ref 0.3–1.2)
Total Bilirubin: 0.8 mg/dL (ref 0.3–1.2)
Total Protein: 4.7 g/dL — ABNORMAL LOW (ref 6.0–8.3)

## 2010-05-20 LAB — URINALYSIS, ROUTINE W REFLEX MICROSCOPIC
Glucose, UA: NEGATIVE mg/dL
Hgb urine dipstick: NEGATIVE
Protein, ur: NEGATIVE mg/dL
Urobilinogen, UA: 0.2 mg/dL (ref 0.0–1.0)

## 2010-05-20 LAB — CULTURE, BLOOD (ROUTINE X 2)
Culture: NO GROWTH
Culture: NO GROWTH

## 2010-05-20 LAB — AMYLASE: Amylase: 23 U/L (ref 0–105)

## 2010-05-20 LAB — CARDIAC PANEL(CRET KIN+CKTOT+MB+TROPI)
CK, MB: 0.6 ng/mL (ref 0.3–4.0)
Total CK: 16 U/L (ref 7–177)

## 2010-05-20 LAB — MAGNESIUM: Magnesium: 1.8 mg/dL (ref 1.5–2.5)

## 2010-05-20 LAB — OCCULT BLOOD (STOOL CUP TO LAB): Fecal Occult Bld: POSITIVE

## 2010-05-20 LAB — URINE CULTURE

## 2010-05-20 LAB — HEMOCCULT GUIAC POC 1CARD (OFFICE): Fecal Occult Bld: POSITIVE

## 2010-06-07 LAB — BASIC METABOLIC PANEL
CO2: 28 mEq/L (ref 19–32)
CO2: 31 mEq/L (ref 19–32)
Chloride: 102 mEq/L (ref 96–112)
Creatinine, Ser: 0.53 mg/dL (ref 0.4–1.2)
GFR calc Af Amer: >60 mL/min (ref >60)
GFR calc non Af Amer: >60 mL/min (ref >60)
Glucose, Bld: 123 mg/dL — ABNORMAL HIGH (ref 70–99)
Potassium: 3.9 mEq/L (ref 3.5–5.1)
Potassium: 4.3 mEq/L (ref 3.5–5.1)
Sodium: 136 mEq/L (ref 135–145)
Sodium: 138 mEq/L (ref 135–145)

## 2010-06-07 LAB — DIFFERENTIAL
Lymphs Abs: 1.2 10*3/uL (ref 0.7–4.0)
Monocytes Relative: 7 % (ref 3–12)
Neutro Abs: 10.6 10*3/uL — ABNORMAL HIGH (ref 1.7–7.7)
Neutrophils Relative %: 83 % — ABNORMAL HIGH (ref 43–77)

## 2010-06-07 LAB — CBC
HCT: 29 % — ABNORMAL LOW (ref 36.0–46.0)
HCT: 29.4 % — ABNORMAL LOW (ref 36.0–46.0)
HCT: 31.8 % — ABNORMAL LOW (ref 36.0–46.0)
Hemoglobin: 10 g/dL — ABNORMAL LOW (ref 12.0–15.0)
Hemoglobin: 10.8 g/dL — ABNORMAL LOW (ref 12.0–15.0)
Hemoglobin: 9.7 g/dL — ABNORMAL LOW (ref 12.0–15.0)
Hemoglobin: 9.9 g/dL — ABNORMAL LOW (ref 12.0–15.0)
MCHC: 33.9 g/dL (ref 30.0–36.0)
MCHC: 33.9 g/dL (ref 30.0–36.0)
MCHC: 34.9 g/dL (ref 30.0–36.0)
MCV: 92 fL (ref 78.0–100.0)
MCV: 92.3 fL (ref 78.0–100.0)
Platelets: 196 10*3/uL (ref 150–400)
Platelets: 277 10*3/uL (ref 150–400)
RBC: 3.21 MIL/uL — ABNORMAL LOW (ref 3.87–5.11)
RBC: 3.44 MIL/uL — ABNORMAL LOW (ref 3.87–5.11)
RDW: 13.6 % (ref 11.5–15.5)
RDW: 13.7 % (ref 11.5–15.5)
RDW: 14 % (ref 11.5–15.5)

## 2010-06-07 LAB — COMPREHENSIVE METABOLIC PANEL
ALT: 11 U/L (ref 0–35)
Albumin: 2.4 g/dL — ABNORMAL LOW (ref 3.5–5.2)
Alkaline Phosphatase: 103 U/L (ref 39–117)
BUN: 13 mg/dL (ref 6–23)
BUN: 2 mg/dL — ABNORMAL LOW (ref 6–23)
BUN: 3 mg/dL — ABNORMAL LOW (ref 6–23)
Calcium: 8.1 mg/dL — ABNORMAL LOW (ref 8.4–10.5)
Chloride: 105 mEq/L (ref 96–112)
Creatinine, Ser: 0.62 mg/dL (ref 0.4–1.2)
Glucose, Bld: 117 mg/dL — ABNORMAL HIGH (ref 70–99)
Glucose, Bld: 133 mg/dL — ABNORMAL HIGH (ref 70–99)
Glucose, Bld: 94 mg/dL (ref 70–99)
Potassium: 4 mEq/L (ref 3.5–5.1)
Sodium: 136 mEq/L (ref 135–145)
Total Bilirubin: 0.4 mg/dL (ref 0.3–1.2)
Total Protein: 4.6 g/dL — ABNORMAL LOW (ref 6.0–8.3)
Total Protein: 5.1 g/dL — ABNORMAL LOW (ref 6.0–8.3)

## 2010-06-07 LAB — PHOSPHORUS: Phosphorus: 3.3 mg/dL (ref 2.3–4.6)

## 2010-06-07 LAB — BRAIN NATRIURETIC PEPTIDE: Pro B Natriuretic peptide (BNP): 93 pg/mL (ref 0.0–100.0)

## 2010-06-07 LAB — CLOSTRIDIUM DIFFICILE EIA

## 2010-06-07 LAB — PROTEIN ELECTROPH W RFLX QUANT IMMUNOGLOBULINS
Alpha-1-Globulin: 11.6 % — ABNORMAL HIGH (ref 2.9–4.9)
Gamma Globulin: 13.9 % (ref 11.1–18.8)
M-Spike, %: NOT DETECTED g/dL

## 2010-06-07 LAB — IRON AND TIBC: TIBC: 117 ug/dL — ABNORMAL LOW (ref 250–470)

## 2010-06-07 LAB — FOLATE: Folate: 13.6 ng/mL

## 2010-06-07 LAB — URINALYSIS, ROUTINE W REFLEX MICROSCOPIC
Nitrite: NEGATIVE
Protein, ur: NEGATIVE mg/dL
Urobilinogen, UA: 0.2 mg/dL (ref 0.0–1.0)

## 2010-06-07 LAB — URINE MICROSCOPIC-ADD ON

## 2010-06-07 LAB — GLUCOSE, CAPILLARY
Glucose-Capillary: 118 mg/dL — ABNORMAL HIGH (ref 70–99)
Glucose-Capillary: 130 mg/dL — ABNORMAL HIGH (ref 70–99)

## 2010-06-07 LAB — RETICULOCYTES: Retic Count, Absolute: 13.6 10*3/uL — ABNORMAL LOW (ref 19.0–186.0)

## 2010-06-07 LAB — MAGNESIUM: Magnesium: 1.9 mg/dL (ref 1.5–2.5)

## 2010-06-07 LAB — SEDIMENTATION RATE: Sed Rate: 30 mm/hr — ABNORMAL HIGH (ref 0–22)

## 2010-06-07 LAB — HEMOCCULT GUIAC POC 1CARD (OFFICE): Fecal Occult Bld: POSITIVE

## 2010-06-08 LAB — DIFFERENTIAL
Basophils Absolute: 0 10*3/uL (ref 0.0–0.1)
Eosinophils Absolute: 0 10*3/uL (ref 0.0–0.7)
Eosinophils Absolute: 0.1 10*3/uL (ref 0.0–0.7)
Lymphocytes Relative: 11 % — ABNORMAL LOW (ref 12–46)
Lymphocytes Relative: 9 % — ABNORMAL LOW (ref 12–46)
Lymphs Abs: 0.6 10*3/uL — ABNORMAL LOW (ref 0.7–4.0)
Monocytes Relative: 15 % — ABNORMAL HIGH (ref 3–12)
Monocytes Relative: 21 % — ABNORMAL HIGH (ref 3–12)
Neutro Abs: 5.3 10*3/uL (ref 1.7–7.7)
Neutrophils Relative %: 70 % (ref 43–77)
Neutrophils Relative %: 73 % (ref 43–77)

## 2010-06-08 LAB — POCT URINALYSIS DIP (DEVICE)
Glucose, UA: NEGATIVE mg/dL
Protein, ur: 30 mg/dL — AB
Specific Gravity, Urine: 1.01 (ref 1.005–1.030)
Urobilinogen, UA: 0.2 mg/dL (ref 0.0–1.0)

## 2010-06-08 LAB — CBC
HCT: 30.1 % — ABNORMAL LOW (ref 36.0–46.0)
HCT: 32.8 % — ABNORMAL LOW (ref 36.0–46.0)
Hemoglobin: 10.2 g/dL — ABNORMAL LOW (ref 12.0–15.0)
MCHC: 33.7 g/dL (ref 30.0–36.0)
MCV: 92.7 fL (ref 78.0–100.0)
MCV: 93.4 fL (ref 78.0–100.0)
Platelets: 202 10*3/uL (ref 150–400)
RBC: 3.25 MIL/uL — ABNORMAL LOW (ref 3.87–5.11)
RBC: 3.51 MIL/uL — ABNORMAL LOW (ref 3.87–5.11)
WBC: 6.3 10*3/uL (ref 4.0–10.5)
WBC: 7.3 10*3/uL (ref 4.0–10.5)

## 2010-06-08 LAB — COMPREHENSIVE METABOLIC PANEL
AST: 17 U/L (ref 0–37)
BUN: 22 mg/dL (ref 6–23)
CO2: 26 mEq/L (ref 19–32)
Calcium: 7.7 mg/dL — ABNORMAL LOW (ref 8.4–10.5)
Chloride: 107 mEq/L (ref 96–112)
Creatinine, Ser: 0.92 mg/dL (ref 0.4–1.2)
GFR calc Af Amer: >60 mL/min (ref >60)
GFR calc non Af Amer: 59 mL/min — ABNORMAL LOW (ref >60)
Glucose, Bld: 101 mg/dL — ABNORMAL HIGH (ref 70–99)
Total Bilirubin: 0.5 mg/dL (ref 0.3–1.2)

## 2010-06-08 LAB — URINE MICROSCOPIC-ADD ON

## 2010-06-08 LAB — URINALYSIS, ROUTINE W REFLEX MICROSCOPIC
Bilirubin Urine: NEGATIVE
Glucose, UA: NEGATIVE mg/dL
Ketones, ur: NEGATIVE mg/dL
Nitrite: NEGATIVE
Protein, ur: NEGATIVE mg/dL
pH: 6 (ref 5.0–8.0)

## 2010-06-08 LAB — CLOSTRIDIUM DIFFICILE EIA

## 2010-06-08 LAB — URINE CULTURE: Colony Count: 40000

## 2010-06-08 LAB — CULTURE, BLOOD (ROUTINE X 2): Culture: NO GROWTH

## 2010-06-08 LAB — SEDIMENTATION RATE: Sed Rate: 50 mm/hr — ABNORMAL HIGH (ref 0–22)

## 2010-06-10 LAB — CBC
MCHC: 34.6 g/dL (ref 30.0–36.0)
Platelets: 226 10*3/uL (ref 150–400)
RDW: 13.2 % (ref 11.5–15.5)

## 2010-06-10 LAB — PROTIME-INR
INR: 1 (ref 0.00–1.49)
Prothrombin Time: 13.8 seconds (ref 11.6–15.2)

## 2010-06-10 LAB — DIFFERENTIAL
Basophils Absolute: 0 10*3/uL (ref 0.0–0.1)
Basophils Relative: 0 % (ref 0–1)
Monocytes Absolute: 0.6 10*3/uL (ref 0.1–1.0)
Neutro Abs: 4 10*3/uL (ref 1.7–7.7)
Neutrophils Relative %: 70 % (ref 43–77)

## 2010-06-10 LAB — BASIC METABOLIC PANEL
CO2: 30 mEq/L (ref 19–32)
Calcium: 9.1 mg/dL (ref 8.4–10.5)
Creatinine, Ser: 1.26 mg/dL — ABNORMAL HIGH (ref 0.4–1.2)
Glucose, Bld: 88 mg/dL (ref 70–99)

## 2010-06-10 LAB — APTT: aPTT: 29 seconds (ref 24–37)

## 2010-07-13 ENCOUNTER — Telehealth: Payer: Self-pay | Admitting: Pulmonary Disease

## 2010-07-13 NOTE — Telephone Encounter (Signed)
Error. Cathy [name] going to make phone note under pt name

## 2010-07-13 NOTE — Telephone Encounter (Signed)
Error.Cathy [name] ° ° °

## 2010-07-14 NOTE — Discharge Summary (Signed)
Cathy [name], NAPIERKOWSKI                 ACCOUNT NO.:  [id number]   MEDICAL RECORD NO.:  [id number]          PATIENT TYPE:  INP   LOCATION:  5148                         FACILITY:  [facility]   PHYSICIAN:  Lonzo Cloud. Kriste Basque, MD     DATE OF BIRTH:  1927/09/08   DATE OF ADMISSION:  08/12/2007  DATE OF DISCHARGE:  08/17/2007                               DISCHARGE SUMMARY   FINAL DIAGNOSES:  1. Admitted on August 12, 2007, with acute pyelonephritis and sepsis.      Blood cultures grew an Escherichia coli that was resistant to      amoxicillin, but sensitive to the other antibiotics.  She was      treated with IV Rocephin and switched to p.o. Cipro at discharge.      She had a history of urinary tract infections in the past and      surgery in 2004 by Dr. Patsi Sears with an anteroposterior repair      and sling procedure.  2. History of allergic rhinitis on Allegra.  3. History of hypertension on Lasix.  4. History of hypercholesterolemia treated with diet alone.  5. Hiatus hernia treated with Prilosec as needed.  6. History of diverticulosis.  7. History of irritable bowel syndrome.  8. Degenerative arthritis - on Celebrex p.r.n.  9. History of osteopenia, for which she takes Actonel, calcium, and      vitamins.  10.History of anxiety.   BRIEF HISTORY AND PHYSICAL:  The patient is a 77 year old white female  who presented to emergency room on August 12, 2007, with a 1-day history  of feeling poorly with fever and chills.  She was found to have a  temperature of 103 degrees, abnormal urinalysis, and a high white count.  She was referred for treatment of pyelonephritis and possible sepsis.  As noted, she had a history of urinary tract infections in the past and  had an AP repair and sling procedure by Dr. Patsi Sears in 2004.   PAST MEDICAL HISTORY:  Includes allergic rhinitis, for which she takes  Careers adviser.  She had a history of hypertension and controls this with Lasix  and a low-salt diet.  She has  hypercholesterolemia, but chooses to treat  this with diet alone.  She has a history of hiatus hernia and  diverticulosis.  She has a history of irritable bowel syndrome.  She has  degenerative joint disease and osteopenia for which she takes Actonel,  calcium, and vitamins.  She has had previous hysterectomy, AP repair,  and sling procedure.   PHYSICAL EXAMINATION:  Physical examination at the time of admission  revealed an elderly white female in mild distress with temperature of  103.  Blood pressure 140/70; pulse 90 and regular, respirations 18 per  minute, nonlabored; and O2 sat 94% on room air.  HEENT exam was  unremarkable.  Mucous membranes were slightly dry.  Neck showed no  jugular venous distention and no carotid bruits.  Chest was clear to  percussion and auscultation.  Cardiac exam revealed a regular rhythm,  grade 1/6 systolic ejection murmur at left  sternal border.  No rubs or  gallops heard.  Abdomen was soft and nontender.  There was some minimal  CVA tenderness on the right.  Extremities showed no cyanosis, clubbing,  or edema.  Neurologic exam was intact.   LABORATORY DATA:  Chest x-ray showed normal heart size and clear lung  fields.  There was some minimal scarring at the left base.  CBC showed a  hemoglobin of 12.4, hematocrit 35.8, and white count 27,400 with a left  shift.  Sodium 132, potassium 3.6, chloride 98, CO2 23, BUN 11,  creatinine 1.0, blood sugar 116, total bilirubin 0.7, alk phos 45, SGOT  15, SGPT 14, total protein 6.2, albumin 3.2, and calcium 8.7.  Urinalysis showed white cells, bacteria, and positive nitrite in blood.  Blood culture grew E. coli, which was resistant to ampicillin and  sensitive to the other antibiotics.   HOSPITAL COURSE:  The patient was admitted with pyelonephritis and  placed on IV Rocephin by Dr. Shelle Iron in my absence.  Her temperature  responded nicely to this and Tylenol.  She improved clinically over the  next several  days.  Blood culture was positive for E. coli as noted.  Her urine cleared.  She was able to void without difficulty.  She was  slowly ambulated onward.  After 3-4 days of IV antibiotics, she was  switched to the oral Cipro and felt to be MHB and ready for discharge.   MEDICATIONS AT DISCHARGE:  1. Cipro 250 mg p.o. b.i.d. until gone.  2. Allegra 180 mg p.o. daily for allergies.  3. Furosemide 20 mg p.o. daily for blood pressure.  4. Premarin 1.25 mg p.o. daily as directed.  5. Actonel 35 mg p.o. every week.  6. Calcium supplement daily.  7. Multivitamin supplement daily.  8. Iron supplement daily.  9. Celebrex 200 mg p.o. daily as needed for arthritis pain.  10.Tylenol 2 tablets p.o. q.4 h. as needed.   DISPOSITION:  The patient to be discharged home to finish out a course  of oral Cipro.  She will follow up in the office on Friday, August 25, 2007, at 2 p.m..   CONDITION ON DISCHARGE:  Improved.      Lonzo Cloud. Kriste Basque, MD  Electronically Signed     SMN/MEDQ  D:  08/17/2007  T:  08/17/2007  Job:  [id number]

## 2010-07-14 NOTE — Discharge Summary (Signed)
NAMEANNAYA, BANGERT                 ACCOUNT NO.:  [id number]   MEDICAL RECORD NO.:  [id number]          PATIENT TYPE:  INP   LOCATION:  5505                         FACILITY:  [facility]   PHYSICIAN:  Lonzo Cloud. Kriste Basque, MD     DATE OF BIRTH:  May 16, 1927   DATE OF ADMISSION:  08/28/2008  DATE OF DISCHARGE:  09/05/2008                               DISCHARGE SUMMARY   FINAL DIAGNOSES:  1. Admitted on August 28, 2008, with diarrhea, nausea, and poor oral      intake.  Evaluation was negative for Clostridium difficile.  She      was felt to have gastroenteritis with dehydration.  2. Weakness and failure to thrive at home - the patient improved with      IV fluids and therapy in the hospital.  3. CT abdomen showing inflammatory changes about the gallbladder with      mild gallbladder wall thickening and moderate intra and      extrahepatic biliary ductal dilatation without stone or mass      identified.  4. ? Splenic lesion seen on CT abdomen, but negative by ultrasound.  5. Probable left adrenal adenoma.  6. Anemia with hemoglobin in the 10 range.  7. History of hypertension - previous Lasix therapy held this      admission.  8. History of hypercholesterolemia on simvastatin.  9. History of hiatus hernia and gastroesophageal reflux disease - the      patient on Nexium.  10.History of diverticulosis and irritable bowel syndrome.  11.History of urinary tract infections treated by Dr. Elana Alm and Dr.      Patsi Sears - she has frequency, urge and stress incontinence, and      hypotonic bladder, evaluated by Dr. Patsi Sears.  12.Degenerative arthritis.  13.History of low back pain evaluated by Dr. Jillyn Hidden and Dr. Ethelene Hal.  14.Osteopenia.  15.Anxiety.   BRIEF HISTORY AND PHYSICAL:  The patient is a 77 year old white female  known to me with above medical problems as noted above.  She had been  seen on several occasions over the week prior to admission.  She  developed urinary tract infection and had a  sensitive E. Coli treated  with Cipro.  She had nausea and diarrhea.  Poor fluid intake.  She was  dehydrated with creatinine up to 2.1.  As her symptoms did not abate and  she was weaker and unable to care for herself at home, she was admitted  for further evaluation and treatment.   PAST MEDICAL HISTORY:  Please see above problem list and the office EMR.   PHYSICAL EXAMINATION:  Physical examination at the time of admission  revealed an elderly white female in no acute distress.  Blood pressure  114/62, pulse 96 and regular, respirations 18 per minute not labored,  temperature 101 degrees.  HEENT exam revealed dry mucous membranes,  otherwise negative.  Neck exam showed no jugular venous distention, no  carotid bruits, no thyromegaly, or lymphadenopathy.  Chest exam was  clear to percussion and auscultation.  Cardiac exam revealed a regular  rhythm, grade 1/6 systolic ejection  murmur left sternal border without  rubs or gallops heard.  Abdomen was soft and mildly tender diffusely.  Bowel sounds were intact.  No evidence of organomegaly or masses.  Rectal was deferred.  Extremities showed moderate arthritic changes,  some venous insufficiency, trace edema.  Neurologic exam was intact  without focal abnormalities detected.  Skin exam was negative.   LABORATORY DATA:  Abdominal films showed moderate distal stool burden  suggesting possible constipation or mild fecal impaction, mild small  bowel distention.  CT of the abdomen showed inflammatory changes about  the gallbladder with mild gallbladder wall thickening, moderate intra  and extrahepatic biliary ductal dilatation without obstructing stone or  mass.  Multiple hypoattenuated lesions in the spleen of uncertain  etiology; left adrenal adenoma; scoliosis and moderate degenerative  changes in lumbar spine with previous laminectomy at L2.  Subsequent  abdominal films showed improvement and an abdominal ultrasound showed  distended  gallbladder with wall thickening, no evidence of stones,  biliary ductal dilatation of uncertain etiology, one small left hepatic  lesion possibly a meningioma.  Chest x-ray revealed low lung volumes and  bibasilar atelectasis.  CBC showed hemoglobin 10.2, hematocrit 30.1,  white count 7300 with normal differential.  Sodium 138, potassium 4.2,  chloride 107, CO2 of 26, BUN 22, creatinine 0.9, blood sugar 101.  Liver  enzymes normal.  Total protein 4.9, albumin 2.4, calcium 7.7.  BNP 352,  prior to discharge 98.  Sed rate 50, prior to discharge 30.  Urinalysis  showed some leukocytes.  C. difficile toxin was negative.  Stool for  occult blood was positive.  Stool cultures were all negative.  Serum  iron 28, TIBC 117, saturation 24%, B12 greater than 2000, folate 13.6,  ferritin 390, magnesium 1.8.   HOSPITAL COURSE:  The patient was admitted with probable gastroenteritis  with diarrhea and nausea.  X-rays showed an increased fecal burden and  she improved after her CT scan.  She was seen in consultation by the GI  Service who recommended treatment with vancomycin for possible C.  Difficile despite the fact that she had 2 negative C. Difficile stool  studies.  She gradually improved on this regimen.  She was given  Database administrator.  She was weaned from IV fluids and started  taking p.o. fluids and her diet was advanced to a regular soft diet.  She was seen by the physical therapist, occupational therapist, and the  care management team.  They identified no further home care or DME  needs.  She was felt to be MHB and ready for discharge on September 05, 2008.  GI felt that she may have had an infectious colitis.  Additional  problems noted included the thickened gallbladder and dilated ducts.  Because of her low albumin and nutritional status, she was given some  TNA intravenously for several days while her appetite improved.   MEDICATIONS AT DISCHARGE:  1. Allegra 180 one tablet p.o.  daily as needed for allergies.  2. Enteric-coated aspirin 81 mg p.o. daily.  3. Simvastatin 40 mg p.o. at bedtime.  4. Nexium 40 mg p.o. daily.  5. Imodium 1 tablet every 6 hours as needed for watery diarrhea.  6. Vicodin 1 tablet every 6 hours as needed for pain.  7. Neurontin 100 mg p.o. b.i.d.  8. Actonel 35 mg p.o. weekly.  9. Os-Cal 1 tablet daily.  10.Vitamin D 1000 units daily.  11.Feosol iron 1 tablet daily.  12.Questran Light 4 g, take one-half to  one scoop in water or juice      daily as directed.   She was instructed to stop her previous Lasix, Difenacol, Celebrex, and  Flagyl.   We will contact the patient for followup appointment in 2 weeks.  Her  new medicine list is available in the EMR under discharge note of September 05, 2008.   CONDITION ON DISCHARGE:  Improved.      Lonzo Cloud. Kriste Basque, MD  Electronically Signed     SMN/MEDQ  D:  09/05/2008  T:  09/05/2008  Job:  [id number]

## 2010-07-14 NOTE — Op Note (Signed)
[name], [name]                 ACCOUNT NO.:  [id number]   MEDICAL RECORD NO.:  [id number]          PATIENT TYPE:  INP   LOCATION:  3023                         FACILITY:  [facility]   PHYSICIAN:  Tia Alert, MD     DATE OF BIRTH:  [birth year]   DATE OF PROCEDURE:  06/13/2008  DATE OF DISCHARGE:                               OPERATIVE REPORT   PREOPERATIVE DIAGNOSIS:  Lumbar spinal stenosis, L2-3, L3-4, and L4-5  with left leg pain.   POSTOPERATIVE DIAGNOSIS:  Lumbar spinal stenosis, L2-3, L3-4, and L4-5  with left leg pain.   PROCEDURE:  Decompressive lumbar laminectomy, medial facetectomy, and  foraminotomy L2-3, L3-4, and L4-5 and decompression of the left L3, L4,  and L5 nerve roots.   SURGEON:  Tia Alert, MD   ASSISTANT:  Reinaldo Meeker, MD   ANESTHESIA:  General endotracheal.   COMPLICATIONS:  None apparent.   INDICATIONS FOR THE PROCEDURE:  [name] is a 77 year old female who  presented with severe leg pain, which seemed to follow an L5  distribution.  She had an MRI and a CT myelogram, which showed  significant spinal stenosis at L2-3, L3-4, and L4-5.  I recommended  decompressive laminectomy.  She understood the risks, benefits, and  expected outcome and wished to proceed.   DESCRIPTION OF THE PROCEDURE:  The patient was taken to the operating  room and after induction of adequate generalized endotracheal  anesthesia, she was rolled into the prone position on the Wilson frame  and all pressure points were padded.  Her lumbar region was prepped with  DuraPrep and then draped in usual sterile fashion.  10 mL of local  anesthesia was injected and a dorsal midline incision was made and  carried down to the lumbosacral fascia.  The fascia was opened, and the  paraspinous musculature was taken down in subperiosteal fashion to  expose L2-3, L3-4, and L4-5 bilaterally.  Intraoperative x-ray confirmed  my level and then I removed the spinous processes of L2, L3,  and L4 and  then used a combination of high-speed drill and the Kerrison punches to  perform a hemilaminectomy, medial facetectomy, and foraminotomy at L2-3,  L3-4, and L4-5.  We did the medial facetectomies and lateral recess  decompression on the left side just past the central canal and  decompressed the central canal toward the right side, but spent  considerable time decompressing the lateral recess on the left because  of her left leg pain.  I undercut the lateral recesses to identify the  L3, L4, and L5 nerve roots.  I marched along the nerve roots distally  while undercutting the lateral recess to decompress these nerves  distally into their respective foramina.  There was severe overgrown  yellow ligament and overgrown medial facet that was removed with the  Kerrison punch and a high-speed drill.  Once the decompression was  complete, I was able to palpate along the nerve roots with a coronary  dilator very easily and passed easily along the L3, L4, and L5 nerve  roots.  We inspected  the disk space at each level and found no  significant disk herniation, just a subannular disk bulges, but felt no  diskectomy was needed.  We then irrigated with saline solution  containing bacitracin, dried all the bleeding points with Surgifoam,  with Gelfoam, and with bipolar electrocautery.  We then lined the dura  with Gelfoam and placed a medium Hemovac drain through a separate stab  incision, and then closed the muscle and fascia with 0 Vicryl closing  the subcutaneous and subcuticular tissue with 2-0 and 3-0 Vicryl, and  closed the skin with Benzoin and Steri-Strips.  The drapes were removed.  Sterile dressing was applied.  The patient was  awakened from general anesthesia and transferred to the recovery room in  stable condition.  At the end of the procedure, all sponge, needle, and  instrument counts were correct.      Tia Alert, MD  Electronically Signed     DSJ/MEDQ  D:   06/13/2008  T:  06/14/2008  Job:  [id number]

## 2010-07-14 NOTE — H&P (Signed)
NAMECIANNA, KASPARIAN                 ACCOUNT NO.:  [id number]   MEDICAL RECORD NO.:  [id number]          PATIENT TYPE:  INP   LOCATION:  5148                         FACILITY:  [facility]   PHYSICIAN:  Barbaraann Share, MD,FCCPDATE OF BIRTH:  12[birth year]09-1929   DATE OF ADMISSION:  08/12/2007  DATE OF DISCHARGE:                              HISTORY & PHYSICAL   HISTORY OF PRESENT ILLNESS:  The patient is a very pleasant 77 year old  female who presented to the urgent care this evening with a one-day  history of fevers and chills, as well as general malaise.  She was found  to have a temperature of 102.8, an abnormal urinalysis, and a white cell  count greater than 20,000.  She was then transferred to the [facility]  Emergency Room and will need admission at this time for IV antibiotics  and further workup.  The patient adamantly denies any symptoms of  respiratory illness, abdominal complaints, but has had mild increasing  urine odor and low back pain.  She has not had any open skin issues.  The patient does have a history of recurrent UTIs in the past that was  resolved with stretching of the bladder neck.  The patient was in her  usual state of health until last evening.   PAST MEDICAL HISTORY:  1. Dyslipidemia.  2. History of hiatal hernia.  3. History of colonic diverticulosis.  4. History of irritable bowel syndrome.  5. History of recurrent UTIs in the distant past  6. History of hypertension.  7. History of osteoporosis.   CURRENT MEDICATIONS:  1. Actonel 35 mg 1 tablet every week.  2. Allegra 180 mg one daily.  3. Furosemide 20 mg p.o. daily.  4. Premarin 1.25 mg daily.  5. Celebrex 200 mg daily.  6. Protonix 40 mg daily.  7. Feosol 200 mg one daily.   ALLERGIES:  The patient is allergic to SULFA.   SOCIAL HISTORY:  The patient has a very slight history of tobacco use  but has not done so in greater than 20 years.  She lives at home and is  quite independent.  A bed at 7 volts and  that he.   FAMILY HISTORY:  Noncontributory to the patient's current issues.   REVIEW OF SYSTEMS:  As per history of present illness.   PHYSICAL EXAMINATION:  In general, she is a well-developed female in no  acute distress.  Blood pressure is 142/73, pulse is 95, respiratory rate is 19,  temperature is 102.8 initially, O2 sat at 94% on room air.  HEENT:  Pupils equal, round and reactive to light and accommodation.  Extraocular muscles are intact.  Nares are patent without discharge.  Oropharynx is clear.  Neck is supple without JVD or lymphadenopathy.  No palpable thyromegaly.  Chest is totally clear to auscultation.  Cardiac exam reveals regular rate and rhythm with no rubs or gallops.  There was a 2/6 systolic murmur.  Abdomen is soft, nontender with good bowel sounds.  Genital exam, rectal exam and breast exam was not done and not  indicated.  Lower extremities  are without edema.  Pulses are intact distally.  Neurologically, alert and oriented.  Moves all four extremities.   LABORATORY DATA:  The patient was found to have an abnormal urinalysis  with moderate blood, positive nitrite and leukocyte esterase positive.  Full  urinalysis is pending.  She had a white blood cell count of  27,400, hemoglobin 12.4, hematocrit of 35.8, and platelet count of  212,000.   IMPRESSION:  Probable urinary tract infection.  I cannot rule out the  possibility of pyelonephritis.  The patient will need admission given  her advanced age and frailty, and hopefully we can turn her around  clinically with 1-2 days of IV antibiotics and IV fluids.   PLAN:  1. We will admit for IV antibiotics and fluids.  2. Check cultures.      Barbaraann Share, MD,FCCP  Electronically Signed     KMC/MEDQ  D:  08/12/2007  T:  08/13/2007  Job:  [id number]   cc:   Lonzo Cloud. Kriste Basque, MD

## 2010-07-17 NOTE — Discharge Summary (Signed)
NAMEJOURNIE, Cathy [name]                 ACCOUNT NO.:  [id number]   MEDICAL RECORD NO.:  [id number]          PATIENT TYPE:  INP   LOCATION:  3023                         FACILITY:  [facility]   PHYSICIAN:  Tia Alert, MD     DATE OF BIRTH:  1927-12-19   DATE OF ADMISSION:  06/13/2008  DATE OF DISCHARGE:  06/18/2008                               DISCHARGE SUMMARY   ADMISSION DIAGNOSIS:  Lumbar spinal stenosis.   PROCEDURE:  Decompressive lumbar laminectomy at L2-3, L3-4, and L4-5.   BRIEF HISTORY OF PRESENT ILLNESS:  Ms. Hernandez is a pleasant 77 year old  female who presented with back and left leg pain.  She was found to have  severe spinal stenosis at L2-3, L3-4, and L4-5, recommended  decompressive laminectomy at those three levels.  She understood the  risks, benefits, expected outcome, and wished to proceed.   HOSPITAL COURSE:  The patient was admitted on June 13, 2008 and taken  to the operating room where she underwent a decompressive laminectomy at  L2-3, L3-4, and L4-5.  The patient tolerated the procedure well and was  transferred to the recovery room and then to the floor in stable  condition.  For details of the operative procedure, please see the  dictated operative note.  The patient's hospital course was fairly  routine.  She had no significant leg pain postoperatively, but did have  back soreness.  She was very slow to mobilize.  We ordered physical and  occupational therapy for her.  Her wound remained clean, dry, and  intact.  She made slow strides with Physical Therapy.  They felt that  she was not ready to go home for several days.  She complained of back  soreness with some pain behind the left knee.  Postoperatively, she  continued to mobilize with physical and occupational therapy.  She  remained afebrile with stable vital signs.  Her pain became well  controlled and she was discharged to home in stable condition on June 18, 2008 with plans to follow up in 2  weeks.   FINAL DIAGNOSIS:  Decompressive laminectomy for lumbar spinal stenosis.      Tia Alert, MD  Electronically Signed     DSJ/MEDQ  D:  07/19/2008  T:  07/19/2008  Job:  [id number]

## 2010-07-17 NOTE — Op Note (Signed)
Cathy [name], Cathy [name]                           ACCOUNT NO.:  [id number]   MEDICAL RECORD NO.:  [id number]                   PATIENT TYPE:  OBV   LOCATION:  0373                                 FACILITY:  [facility]   PHYSICIAN:  Sigmund I. Patsi Sears, M.D.         DATE OF BIRTH:  1928/02/19   DATE OF PROCEDURE:  08/20/2002  DATE OF DISCHARGE:                                 OPERATIVE REPORT   PREOPERATIVE DIAGNOSES:  1. Stress urinary incontinence.  2. Grade 3 cystocele.   POSTOPERATIVE DIAGNOSES:  1. Stress urinary incontinence.  2. Grade 3 cystocele.   PROCEDURE:  1. OB tape transobturator sling.  2. Anterior repair.   ANESTHESIA:  General.   SURGEON:  Sigmund I. Patsi Sears, M.D.   ASSISTANT:  Melvyn Novas, M.D.   COMPLICATIONS:  None.   ESTIMATED BLOOD LOSS:  Less then 25 cc.   DRAINS:  Foley catheter and vaginal packing.   SPECIMENS:  None.   HISTORY OF PRESENT ILLNESS:  Cathy [name] is a 77 year old female with a  history of stress urinary incontinence who also has a grade 3 cystocele.  She presented today for repair of her cystocele as well as placement of a  urethral sling.   DESCRIPTION OF PROCEDURE:  Following administration of IV antibiotics and  general anesthesia, Cathy [name] was prepped and draped in the dorsal  lithotomy position.  A weighted speculum was placed in the posterior vaginal  wall.  A grade 3 cystocele was noted.  An approximately 1.5 cm incision was  made in the vaginal mucosa over the mid-urethra.  This was dissected out  sharply as well as bluntly lateral to the urethra bilaterally.  _________  placed under the surgeon's finger in the wound and around either side of the  urethra.  We then located the positioning 5 cm lateral to the clitoris which  corresponded to the obturator fossa bilaterally.  A stab wound was made  beginning on the patient's right side at that position.  Using a trocar,  this was passed posterior to the pubic ramus, and  was palpated in the  vaginal incision lateral to the urethra.  The trocar was passed through and  the [place] OB tape was pulled back through the path of the trocar and out  the stab wound.  This was repeated on the patient's left side.  Gentle  tension was applied on either side until the midline of the obturator tape  was over the mid urethra with enough slack to allow placement of a right  angle between the tape and the urethra.  The incision was then closed using  a running #3 chromic suture.  We then turned our attention to the cystocele.  Beginning as posterior as possible, an incision was made along the anterior  vaginal wall over the cystocele beginning approximately as far back as  possible to ending at a point just proximal to the previously  made incision  for the sling.  This was dissected subcutaneously bilaterally using both  sharp ______ and Metzenbaum scissors as well as blunt dissection to allow  the cystocele to be completely dissected free on all sides.  After doing  this, the incision was reduced and a piece of Vicryl   Dictation ended at this point.     Melvyn Novas, M.D.                      Sigmund I. Patsi Sears, M.D.    DK/MEDQ  D:  08/20/2002  T:  08/20/2002  Job:  [id number]

## 2010-08-21 ENCOUNTER — Encounter: Payer: Self-pay | Admitting: Pulmonary Disease

## 2010-08-21 ENCOUNTER — Ambulatory Visit (INDEPENDENT_AMBULATORY_CARE_PROVIDER_SITE_OTHER): Payer: Medicare Other | Admitting: Pulmonary Disease

## 2010-08-21 DIAGNOSIS — F411 Generalized anxiety disorder: Secondary | ICD-10-CM

## 2010-08-21 DIAGNOSIS — M949 Disorder of cartilage, unspecified: Secondary | ICD-10-CM

## 2010-08-21 DIAGNOSIS — K5730 Diverticulosis of large intestine without perforation or abscess without bleeding: Secondary | ICD-10-CM

## 2010-08-21 DIAGNOSIS — I872 Venous insufficiency (chronic) (peripheral): Secondary | ICD-10-CM

## 2010-08-21 DIAGNOSIS — M545 Low back pain, unspecified: Secondary | ICD-10-CM

## 2010-08-21 DIAGNOSIS — M1990 Unspecified osteoarthritis, unspecified site: Secondary | ICD-10-CM

## 2010-08-21 DIAGNOSIS — M899 Disorder of bone, unspecified: Secondary | ICD-10-CM

## 2010-08-21 DIAGNOSIS — R1319 Other dysphagia: Secondary | ICD-10-CM

## 2010-08-21 DIAGNOSIS — D649 Anemia, unspecified: Secondary | ICD-10-CM

## 2010-08-21 DIAGNOSIS — R32 Unspecified urinary incontinence: Secondary | ICD-10-CM

## 2010-08-21 DIAGNOSIS — K589 Irritable bowel syndrome without diarrhea: Secondary | ICD-10-CM

## 2010-08-21 DIAGNOSIS — I10 Essential (primary) hypertension: Secondary | ICD-10-CM

## 2010-08-21 DIAGNOSIS — K449 Diaphragmatic hernia without obstruction or gangrene: Secondary | ICD-10-CM

## 2010-08-21 DIAGNOSIS — E7800 Pure hypercholesterolemia, unspecified: Secondary | ICD-10-CM

## 2010-08-21 NOTE — Patient Instructions (Signed)
Today we updated your med list in EPIC>    Continue your current meds the same...  We will sched a Rheumatology consult w/ drDeveshwar to see if she can come up w/ something to help your arthritis...  Call for any questions...  Let's plan another follow up visit in 4 months.Marland KitchenMarland Kitchen

## 2010-08-29 ENCOUNTER — Encounter: Payer: Self-pay | Admitting: Pulmonary Disease

## 2010-08-29 NOTE — Progress Notes (Signed)
Subjective:    Patient ID: Cathy [name], female    DOB: 09-12-1927, 77 y.o.   MRN: [id number]  HPI 77 y/o WF here for a follow up visit... she has multiple medical problems including HBP, Hyperchol, HH/ Divertics/ IBS followed by DrDBrodie; hx UTIs/ bladder problems followed by DrTannenbaum; DJD/ LBP/ Osteoporosis followed by DrBeane for Ortho; chronic pain syndrome managed by DrBartko; Anxiety, mild Anemia...  ~  August 18, 2009:  she persists w/ c/o back pain> DrBeane, DrRamos, DrJones, DrBartko- on Neurontin, Celebrex, Flector, & Vicodin (she had recent shot from [employer])... she was c/o dysphagia & had EGD 6/11 by DrDBrodie- +HH, dilated, "it helped"... recent bronchitic exac Rx'd w/ Augmentin, Mucinex, Delsym...  ~  December 16, 2009:  her CC has been her bladder- urinary incont, eval & Rx by DrTennenbaum, no help from Vesicare/ Gelnique, now getting accupuncture... asking for second opinion & I directed her to her GYN for this...  also concerned about her Ortho troubles- DrBeane did right knee injection, but she needs TKR;  ses DrBartko for pain management on Oxycodone, Flector, Neurontin, etc...  despite her stress level she refuses nerve pills...  ~  April 21, 2010:  she had right TKR 1/12 by DrBeane w/ "rough" post op course- in [place] [facility]) for rehab;  she wants to go back on Celebrex & we will write this rx, but she is warned to watch for GI problems & we will monitor renal function etc; she will continue rehab from [facility] & pain management from [facility]...    She notes a mild URI w. scratchy throat & drainage> rec to use OTC Mucinex, fluids, & Chlorseptic spray vs lozenges;  BP remains stable on diet rx alone;  Chol looks good on Simva40;  GI stable on Nexium daily;  she reports still getting accupuncture for her bladder problems & now on Toviaz as well & improved...    Labs today reveal mild anemia Hg=11.8 w/ low Fe=20 (8%sat) and B12 deficient=139;  she is rec to increase her FeSO4  to Bid w/ VitC 500, and start on Vit B12 shots monthly...  ~  August 21, 2010:  33mo ROV & she is c/o arthritis pain all over, all the time> she requests Rheum consultation w/ DrDeveshwar...  She saw [employer] w/ another shot in her back 4/12 but no benefit she says; given OXYCODONE & she takes 3/d, plus NEURONTIN Qhs "for my back & legs burning", & the CELEBREX she requested last OV...  Medically stable>  BP, Chol, Gi, & GU are all at baseline...   Problem List:  ALLERGY (ICD-995.3) - she uses ALLEGRA 180mg  daily and wants to continue w/ this therapy...  HYPERTENSION (ICD-401.9) - on ASA 81mg /d, off Lasix & controlled w/ diet alone, but she notes- "I love salt" (advised restriction), not checking BP's at home, etc...  BP=136/74 feeling well & denies HA, visual symptoms, CP, palpit, SOB, edema, etc...  HYPERCHOLESTEROLEMIA (ICD-272.0) - on SIMVASTATIN 40mg /d now... ~  FLP 4/08 showed TChol 218, TG 86, HDL 77, LDL 117 ~  FLP 2/09 showed TChol 198, TG 48, HDL 73, LDL 116 ~  3/10- she ret on diet alone- FLP w/ TChol 263, TG 65, HDL 81, LDL 158... rec> start Simva40. ~  9/10: needs FLP but not fasting today... LFTs are WNL. ~  3/11: reminded of need for f/u FLP on the Simva40. ~  FLP 6/11 on Simva40 showed TChol 189, TG 76, HDL 77, LDL 97... same Rx.  HIATAL  HERNIA (ICD-553.3) - on NEXIUM 40mg /d... EGD 11/03 by DrDBrodie showed 5cm HH, & esoph stricture dilated... developed dysphagia  5/11 w/ repeate EGD by DrDBrodie> large HH, dilated & improved (may need HH surg)...  denies nausea, vomiting, heartburn, diarrhea, constipation, blood in stool, abdominal pain, swelling, gas...  DIVERTICULOSIS OF COLON (ICD-562.10) & IRRITABLE BOWEL SYNDROME (ICD-564.1) -  ~  last colonoscopy was 11/03 by DrDBrodie & showed divertics only... f/u 53yrs. ~  9/10:  she notes some constipation & Questran discontinued. ~  2/11:  FlexSig in hosp showed lymphocytic colitis- Rx'd Entocort & resolved...  S/P CHOLECYSTECTOMY  for Stones 2/11 by DrHoxsworth...  UTI (ICD-599.0) - prev evals by GYN DrMcPhail, and Urology DrTannenbaum... she has frequency, urge & stress incontinence, and a hypotonic bladder- s/p pubovag sling surg for incont... they tried Bethanachol (she stopped this med) & Gelnique (she states didn't help)... ~  Urology f/u by DrTannenbaum 12/10 reviewed & stable w/ phys therapy.  HYSTERECTOMY, HX OF (ICD-V45.77) - GYN= DrMcPhail who still has her on Premarin 1.25mg /d...  DEGENERATIVE JOINT DISEASE (ICD-715.90) - on CELEBREX (uses it Prn)... followed by DrBeane/Olin for Ortho & DrJones for Neurosurg... she has had right knee injections but needs TKR per DrBeane. ~  6/12:  She requested referral to DrDeveshwar for Rheum consult...  BACK PAIN, LUMBAR (ICD-724.2) - she has chronic kyphoscoliosis... DrBeane has evaluated and Rx w/ shots, Pred, pain meds... she has seen DrRamos & had selective L5 nerve root block (without benefit)... second opinion from DrDJones, Neurosurg w/ lumbar laminectomy 5/10 providing temp relief... Now followed by [facility] for PAIN MANAGEMENT... ~  9/10: c/o recurrent LBP w/ MRI per DrJones and epid shot planned per DrBartko... now on FLECTOR Prn, NEURONTIN 100mg - 2Tid, VICODIN up to 3 per day... ~  12/10: note from [facility] reviewed- pain management. ~  5/11:  seen by [facility] for another shot, Vicodin changed to OXYCODONE.  OSTEOPENIA (ICD-733.90) - she takes ALENDRONATE, Ca++, MVI, Vit D... last BMD was 6/08 at [facility] & showed TScores from -2.0 to -2.7, sl better than in 2005... ~  Vit D level 2/09 was 30 (30-90) and rec to take Vit D 1000 u daily supplement...  ANXIETY (ICD-300.00) - she declines anxiolytic Rx despite her anxiety...  ANEMIA OF CHRONIC DISEASE (ICD-285.29) -  taking FEOSOL 200mg  Bid w/ VitC now & added Vit B12 shots 2/12. ~  labs 2/09 showed Hg= 10.4 & MCV= 90... ~  labs 3/10 showed Hg= 12.9 ~  labs 9/10 showed Hg= 12.0, Fe= 89 ~  labs 3/11 showed Hg=  10.7, Fe= 55, TIBC= 119, Sat=35%. ~  labs 6/11 showed Hg= 11.8, Fe= 100 (37%sat). ~  Labs 2/12 showed Hg= 11.8, Fe= 20 (8%sat), Vit B12= 139...   VITAMIN B-12 DEFICIENCY>  Started on Vit B12 shots monthly 2/12... ~  Labs 2/12 showed Vit B12 level = 139... rec to start B12 shots monthly...   Past Surgical History  Procedure Date  . Cataract surgery   . Abdominal hysterectomy   . Decompressive lumbar laminectomy 06/2008    at L2-3, L3-4 and L4-5 by Dr. Yetta Barre  . Laparoscopic cholecystectomy 04/2009    Dr. Odie Sera    Outpatient Encounter Prescriptions as of 08/21/2010  Medication Sig Dispense Refill  . alendronate (FOSAMAX) 70 MG tablet Take 70 mg by mouth every 7 (seven) days. Take with a full glass of water on an empty stomach.       [name] aspirin 81 MG tablet Take 81 mg by mouth  daily.        . calcium-vitamin D (OSCAL WITH D) 500-200 MG-UNIT per tablet Take 1 tablet by mouth daily.        . celecoxib (CELEBREX) 200 MG capsule Take 200 mg by mouth daily. As needed for joint pain       . Cholecalciferol (VITAMIN D) 1000 UNITS capsule Take 1,000 Units by mouth daily.        . diclofenac (FLECTOR) 1.3 % PTCH Apply as directed by Dr. Murray Hodgkins       . esomeprazole (NEXIUM) 40 MG capsule Take 40 mg by mouth daily before breakfast.        . Ferrous Sulfate Dried (FEOSOL) 200 (65 FE) MG TABS Take 1 tablet by mouth 2 (two) times daily. Take with a vitamin c 500mg        . fesoterodine (TOVIAZ) 4 MG TB24 Take 8 mg by mouth daily. Per Dr. Patsi Sears      . fexofenadine (ALLEGRA) 180 MG tablet Take 180 mg by mouth daily. As needed for allergies        . gabapentin (NEURONTIN) 100 MG tablet Take 2 tablets  By mouth two times daily       . gabapentin (NEURONTIN) 300 MG capsule Take 300 mg by mouth at bedtime.       [name] oxyCODONE-acetaminophen (PERCOCET) 5-325 MG per tablet Take 1 tablet by mouth every 6 (six) hours as needed.        . simvastatin (ZOCOR) 40 MG tablet Take 40 mg by mouth at bedtime.           Allergies  Allergen Reactions  . MVH:[id number] Acid+Aspartame     REACTION: diarrhea  . Sulfonamide Derivatives     REACTION: unsure of reaction    Review of Systems        See HPI - all other systems neg except as noted...       The patient complains of decreased hearing, dyspnea on exertion, incontinence, arthritis all over & difficulty walking.  The patient denies anorexia, fever, weight loss, weight gain, vision loss, hoarseness, chest pain, syncope, peripheral edema, prolonged cough, headaches, hemoptysis, abdominal pain, melena, hematochezia, severe indigestion/heartburn, hematuria, muscle weakness, suspicious skin lesions, transient blindness, depression, unusual weight change, abnormal bleeding, enlarged lymph nodes, and angioedema.     Objective:   Physical Exam     WD, WN, 77 y/o WF in NAD... GENERAL:  Alert & oriented; pleasant & cooperative... HEENT:  [place]/AT, EOM-full, PERRLA, EACs-clear, TMs-wnl, NOSE-clear, THROAT-clear & wnl. NECK:  Supple w/ fairROM; no JVD; normal carotid impulses w/o bruits; no thyromegaly or nodules palpated; no lymphadenopathy. CHEST:  Clear to P & A; without wheezes/ rales/ or rhonchi heard... HEART:  Regular Rhythm; without murmurs/ rubs/ or gallops detected... ABDOMEN:  Soft & sl tenderness, normal bowel sounds; no organomegaly or masses palpated...no guarding or rebound.  BACK:  s/p lumbar lam & scoliosis/ kyphosis EXT: without deformities, mod arthritic changes; no varicose veins/ +venous insuffic/ tr-1+ edema... NEURO:  CN's intact; no focal neuro deficits... DERM:  No lesions noted; no rash etc...   Assessment & Plan:   DJD/ LBP/ Chronic Pain Syndrome>  This is her CC & main porob; she requests referral to Rheum DrDeveshwar; already seeing Dr Murray Hodgkins for Pain Management, DrOlin & DrBeane for Ortho;  See meds above.  HBP>  Remains controlled on diet alone...  Ven Insuffic>  Reminded to avoid sodium, elev  legs, support hose..  CHOL>  On Simva40 + diet; last  FLP 2/12 reviewed & looks good...  GI>  HH/ Dysphagia/ Divertics/ IBS/ Lymphocytic colitis>  Followed by DrDBrodie & stable, contionue same...  GU>  UTIs/ Incontinence>  Managed by DrTannenbaum...  Anxiety>  She declines anxiolytic meds...  ANEMIA>  Fe & B12 deficient>  On Fe Bid & B12 shots monthly.Marland KitchenMarland Kitchen

## 2010-10-14 ENCOUNTER — Other Ambulatory Visit: Payer: Self-pay | Admitting: *Deleted

## 2010-10-14 MED ORDER — CELECOXIB 200 MG PO CAPS: 200.0000 mg | ORAL_CAPSULE | Freq: Every day | ORAL | Status: DC

## 2010-11-26 LAB — BASIC METABOLIC PANEL
BUN: 13
CO2: 26
Calcium: 8.5
Calcium: 7.7 — ABNORMAL LOW
Creatinine, Ser: 1.02
GFR calc Af Amer: >60
GFR calc non Af Amer: >60
Glucose, Bld: 141 — ABNORMAL HIGH
Glucose, Bld: 109 — ABNORMAL HIGH
Sodium: 135

## 2010-11-26 LAB — DIFFERENTIAL
Basophils Absolute: 0
Basophils Absolute: 0
Basophils Relative: 0
Eosinophils Relative: 0
Eosinophils Relative: 1
Lymphocytes Relative: 2 — ABNORMAL LOW
Lymphs Abs: 0.6 — ABNORMAL LOW
Monocytes Absolute: 1.1 — ABNORMAL HIGH
Neutrophils Relative %: 91 — ABNORMAL HIGH

## 2010-11-26 LAB — CBC
HCT: 35.8 — ABNORMAL LOW
Hemoglobin: 10.1 — ABNORMAL LOW
MCHC: 35
Platelets: 205
Platelets: 212
Platelets: 221
RDW: 14.2
RDW: 15
RDW: 14.9
WBC: 27.4 — ABNORMAL HIGH
WBC: 19 — ABNORMAL HIGH
WBC: 9.5

## 2010-11-26 LAB — URINALYSIS, ROUTINE W REFLEX MICROSCOPIC
Glucose, UA: NEGATIVE
Hgb urine dipstick: NEGATIVE
Nitrite: NEGATIVE
Protein, ur: NEGATIVE
Specific Gravity, Urine: 1.009
Urobilinogen, UA: 0.2
Urobilinogen, UA: 0.2
pH: 7

## 2010-11-26 LAB — COMPREHENSIVE METABOLIC PANEL
AST: 19
Albumin: 2.2 — ABNORMAL LOW
Alkaline Phosphatase: 45
Alkaline Phosphatase: 93
BUN: 11
Chloride: 98
Chloride: 108
Creatinine, Ser: 0.99
GFR calc Af Amer: >60
Glucose, Bld: 116 — ABNORMAL HIGH
Potassium: 3.6
Potassium: 4.2
Total Bilirubin: 0.7
Total Bilirubin: 0.3

## 2010-11-26 LAB — CULTURE, BLOOD (ROUTINE X 2): Culture: NO GROWTH

## 2010-11-26 LAB — POCT URINALYSIS DIP (DEVICE)
Glucose, UA: NEGATIVE
Ketones, ur: 40 — AB
Operator id: 282151
Protein, ur: 100 — AB
Specific Gravity, Urine: 1.015
Urobilinogen, UA: 0.2

## 2010-12-23 ENCOUNTER — Encounter: Payer: Self-pay | Admitting: Pulmonary Disease

## 2010-12-23 ENCOUNTER — Ambulatory Visit (INDEPENDENT_AMBULATORY_CARE_PROVIDER_SITE_OTHER): Payer: Medicare Other | Admitting: Pulmonary Disease

## 2010-12-23 DIAGNOSIS — E7800 Pure hypercholesterolemia, unspecified: Secondary | ICD-10-CM

## 2010-12-23 DIAGNOSIS — Z23 Encounter for immunization: Secondary | ICD-10-CM

## 2010-12-23 DIAGNOSIS — F411 Generalized anxiety disorder: Secondary | ICD-10-CM

## 2010-12-23 DIAGNOSIS — K589 Irritable bowel syndrome without diarrhea: Secondary | ICD-10-CM

## 2010-12-23 DIAGNOSIS — M949 Disorder of cartilage, unspecified: Secondary | ICD-10-CM

## 2010-12-23 DIAGNOSIS — D649 Anemia, unspecified: Secondary | ICD-10-CM

## 2010-12-23 DIAGNOSIS — I10 Essential (primary) hypertension: Secondary | ICD-10-CM

## 2010-12-23 DIAGNOSIS — M1990 Unspecified osteoarthritis, unspecified site: Secondary | ICD-10-CM

## 2010-12-23 DIAGNOSIS — K5730 Diverticulosis of large intestine without perforation or abscess without bleeding: Secondary | ICD-10-CM

## 2010-12-23 DIAGNOSIS — N390 Urinary tract infection, site not specified: Secondary | ICD-10-CM

## 2010-12-23 DIAGNOSIS — K449 Diaphragmatic hernia without obstruction or gangrene: Secondary | ICD-10-CM

## 2010-12-23 DIAGNOSIS — M545 Low back pain, unspecified: Secondary | ICD-10-CM

## 2010-12-23 DIAGNOSIS — I872 Venous insufficiency (chronic) (peripheral): Secondary | ICD-10-CM

## 2010-12-23 DIAGNOSIS — M899 Disorder of bone, unspecified: Secondary | ICD-10-CM

## 2010-12-23 DIAGNOSIS — R32 Unspecified urinary incontinence: Secondary | ICD-10-CM

## 2010-12-23 NOTE — Progress Notes (Signed)
Subjective:    Patient ID: Cathy [name], female    DOB: 21-Dec-1927, 77 y.o.   MRN: [id number]  HPI  77 y/o WF here for a follow up visit... she has multiple medical problems including HBP, Hyperchol, HH/ Divertics/ IBS followed by DrDBrodie; hx UTIs/ bladder problems followed by DrTannenbaum; DJD/ LBP/ Osteoporosis followed by DrBeane for Ortho; chronic pain syndrome managed by DrBartko; Anxiety, mild Anemia...  ~  August 18, 2009:  she persists w/ c/o back pain> DrBeane, DrRamos, DrJones, DrBartko- on Neurontin, Celebrex, Flector, & Vicodin (she had recent shot from [employer])... she was c/o dysphagia & had EGD 6/11 by DrDBrodie- +HH, dilated, "it helped"... recent bronchitic exac Rx'd w/ Augmentin, Mucinex, Delsym...  ~  December 16, 2009:  her CC has been her bladder- urinary incont, eval & Rx by DrTannenbaum, no help from Vesicare/ Gelnique, now getting accupuncture... asking for second opinion & I directed her to her GYN for this...  also concerned about her Ortho troubles- DrBeane did right knee injection, but she needs TKR;  ses DrBartko for pain management on Oxycodone, Flector, Neurontin, etc...  despite her stress level she refuses nerve pills...  ~  April 21, 2010:  she had right TKR 1/12 by DrBeane w/ "rough" post op course- in [place] [facility]) for rehab; she wants to go back on Celebrex & we will write this rx, but she is warned to watch for GI problems & we will monitor renal function etc; she will continue rehab from [facility] & pain management from [facility]...    She notes a mild URI w. scratchy throat & drainage> rec to use OTC Mucinex, fluids, & Chlorseptic spray vs lozenges;  BP remains stable on diet rx alone;  Chol looks good on Simva40;  GI stable on Nexium daily;  she reports still getting accupuncture for her bladder problems & now on Toviaz as well & improved...    Labs today reveal mild anemia Hg=11.8 w/ low Fe=20 (8%sat) and B12 deficient=139;  she is rec to increase her FeSO4  to Bid w/ VitC 500, and start on Vit B12 shots monthly...  ~  August 21, 2010:  93mo ROV & she is c/o arthritis pain all over, all the time> she requests Rheum consultation w/ DrDeveshwar...  She saw [facility] w/ another shot in her back 4/12 but no benefit she says; given OXYCODONE & she takes 3/d, plus NEURONTIN Qhs "for my back & legs burning", & the CELEBREX she requested last OV...  Medically stable>  BP, Chol, GI, & GU are all at baseline...  ~  December 23, 2010:  93mo ROV & she states that she is improved- feeling better, less discomfort, etc;  She had Rheum consult w/ DrDeveshwar 9/12 for her "pain all over" back surg DrJones 2010, siatica, injured thumb in door, aches & pains all over> found to have OA, Rt TKR, scoliosis, lumbar disc dis w/ fusion, vertebral compression, osteoporosis, pain management DrBartko> recs were to continue pain management DrBartko & consider Forteo Rx...    HBP> not on meds; diet controlled- BP= 138/88 and she denies CP, palpit, syncope, ch in SOB/DOE, edema, etc...    CHOL> on Simva40 w/ good control; prev FLP showed all parameters at goal & LDL 82...    LargeHH> on Nexium40 & stable w/o abd pain, n/v, dysphagia, etc...    Divertics/ IBS/ Colitis> treated 2011 for lymphocytic colitis w/ entocort & resolved...    GU> incont & bladder prob treated by DrTannenbaum; on Toviaz8  now but states she prev received a shot in her ankle but Medicare wouldn't pay...    DJD/ Back Pain, Chronic Pain> see above per DrDeveshwar & DrBartko> on Neurontin 100Bid+300Qhs, Percocet5 Q6H, Celebrex200, Flector, etc...    Osteoporosis> on Fosamax, Calcium, MVI, VitD; DrDeveshwar wanted to consider Forteo...    Anemia> on FeSO4 Bid w/ VitC and B12 orally every day (?if she ever started this)...   Problem List:  ALLERGY (ICD-995.3) - she uses ALLEGRA 180mg  daily and wants to continue w/ this therapy...  HYPERTENSION (ICD-401.9) - on ASA 81mg /d, off Lasix & controlled w/ diet alone, but  she notes- "I love salt" (advised restriction), not checking BP's at home, etc...    HYPERCHOLESTEROLEMIA (ICD-272.0) - on SIMVASTATIN 40mg /d now... ~  FLP 4/08 showed TChol 218, TG 86, HDL 77, LDL 117 ~  FLP 2/09 showed TChol 198, TG 48, HDL 73, LDL 116 ~  3/10- she ret on diet alone- FLP w/ TChol 263, TG 65, HDL 81, LDL 158... rec> start Simva40. ~  9/10: needs FLP but not fasting today... LFTs are WNL. ~  3/11: reminded of need for f/u FLP on the Simva40. ~  FLP 6/11 on Simva40 showed TChol 189, TG 76, HDL 77, LDL 97... same Rx. ~  FLP 2/12 on Simva40 showed TChol 160, TG 61, HDL 66, LDL 82  HIATAL HERNIA (ICD-553.3) - on NEXIUM 40mg /d... EGD 11/03 by DrDBrodie showed 5cm HH, & esoph stricture dilated... developed dysphagia  5/11 w/ repeat EGD by DrDBrodie> large HH, dilated & improved (may need HH surg)...   ~  She denies nausea, vomiting, heartburn, diarrhea, constipation, blood in stool, abdominal pain, swelling, gas...  DIVERTICULOSIS OF COLON (ICD-562.10) & IRRITABLE BOWEL SYNDROME (ICD-564.1) -  ~  last colonoscopy was 11/03 by DrDBrodie & showed divertics only... f/u 61yrs. ~  9/10:  she notes some constipation & Questran discontinued. ~  2/11:  FlexSig in hosp showed lymphocytic colitis- Rx'd Entocort & resolved...  S/P CHOLECYSTECTOMY for Stones 2/11 by DrHoxsworth...  UTI (ICD-599.0) - prev evals by GYN DrMcPhail, and Urology DrTannenbaum... she has frequency, urge & stress incontinence, and a hypotonic bladder- s/p pubovag sling surg for incont... they tried Bethanachol (she stopped this med) & Gelnique (she states didn't help)... ~  Urology f/u by DrTannenbaum 12/10 reviewed & stable w/ phys therapy.  HYSTERECTOMY, HX OF (ICD-V45.77) - her last GYN was DrMcPhail before he retired...  DEGENERATIVE JOINT DISEASE (ICD-715.90) - on CELEBREX (uses it Prn)... followed by DrBeane/Olin for Ortho, DrDeveshwar for Rheum, & DrJones for Neurosurg... she has had right knee injections  followed by right TKR 1/12 per DrBeane.  BACK PAIN, LUMBAR (ICD-724.2) - she has chronic kyphoscoliosis... DrBeane has evaluated and Rx w/ shots, Pred, pain meds... she has seen DrRamos & had selective L5 nerve root block (without benefit)... second opinion from DrDJones, Neurosurg w/ lumbar laminectomy 5/10 providing temp relief... Now followed by [facility] for PAIN MANAGEMENT... ~  9/10: c/o recurrent LBP w/ MRI per DrJones and epid shot planned per DrBartko... now on FLECTOR Prn, NEURONTIN 100mg - 2Tid, VICODIN up to 3 per day... ~  12/10: note from [facility] reviewed- pain management. ~  5/11:  seen by [facility] for another shot, Vicodin changed to OXYCODONE. ~  She has been seen by DrDeveshwar for rheum & she rec continued pain management by DrBartko...  OSTEOPENIA (ICD-733.90) - she takes ALENDRONATE, Ca++, MVI, Vit D... last BMD was 6/08 at [facility] & showed TScores from -2.0 to -2.7, sl  better than in 2005... ~  Vit D level 2/09 was 30 (30-90) and rec to take Vit D 1000 u daily supplement...  ANXIETY (ICD-300.00) - she declines anxiolytic Rx despite her anxiety...  ANEMIA OF CHRONIC DISEASE (ICD-285.29) -  taking FEOSOL 200mg  Bid w/ VitC now & added Vit B12 shots 2/12. ~  labs 2/09 showed Hg= 10.4 & MCV= 90... ~  labs 3/10 showed Hg= 12.9 ~  labs 9/10 showed Hg= 12.0, Fe= 89 ~  labs 3/11 showed Hg= 10.7, Fe= 55, TIBC= 119, Sat=35%. ~  labs 6/11 showed Hg= 11.8, Fe= 100 (37%sat). ~  Labs 2/12 showed Hg= 11.8, Fe= 20 (8%sat), Vit B12= 139...   VITAMIN B-12 DEFICIENCY>  She was supposed to have started on oral Vit B12 supplements 2/12... ~  Labs 2/12 showed Vit B12 level = 139... rec to start oral B12 supplement ~1063mcg daily...   Past Surgical History  Procedure Date  . Cataract surgery   . Abdominal hysterectomy   . Decompressive lumbar laminectomy 06/2008    at L2-3, L3-4 and L4-5 by Dr. Yetta Barre  . Laparoscopic cholecystectomy 04/2009    Dr. Odie Sera    Outpatient Encounter  Prescriptions as of 12/23/2010  Medication Sig Dispense Refill  . alendronate (FOSAMAX) 70 MG tablet Take 70 mg by mouth every 7 (seven) days. Take with a full glass of water on an empty stomach.       [name] aspirin 81 MG tablet Take 81 mg by mouth daily.        . calcium-vitamin D (OSCAL WITH D) 500-200 MG-UNIT per tablet Take 1 tablet by mouth daily.        . celecoxib (CELEBREX) 200 MG capsule Take 1 capsule (200 mg total) by mouth daily. As needed for joint pain  90 capsule  3  . Cholecalciferol (VITAMIN D) 1000 UNITS capsule Take 1,000 Units by mouth daily.        . diclofenac (FLECTOR) 1.3 % PTCH Apply as directed by Dr. Murray Hodgkins       . esomeprazole (NEXIUM) 40 MG capsule Take 40 mg by mouth daily before breakfast.        . Ferrous Sulfate Dried (FEOSOL) 200 (65 FE) MG TABS Take 1 tablet by mouth 2 (two) times daily. Take with a vitamin c 500mg        . Fesoterodine Fumarate (TOVIAZ) 8 MG TB24 Take 8 mg by mouth daily. Per Dr. Patsi Sears       . fexofenadine (ALLEGRA) 180 MG tablet Take 180 mg by mouth daily. As needed for allergies        . gabapentin (NEURONTIN) 100 MG tablet Take 2 tablets  By mouth two times daily       . gabapentin (NEURONTIN) 300 MG capsule Take 300 mg by mouth at bedtime.       [name] oxyCODONE-acetaminophen (PERCOCET) 5-325 MG per tablet Take 1 tablet by mouth every 6 (six) hours as needed.        . simvastatin (ZOCOR) 40 MG tablet Take 40 mg by mouth at bedtime.        [name] DISCONTD: fesoterodine (TOVIAZ) 4 MG TB24 Take 8 mg by mouth daily. Per Dr. Patsi Sears        Allergies  Allergen Reactions  . GEX:[id number] Acid+Aspartame     REACTION: diarrhea  . Sulfonamide Derivatives     REACTION: unsure of reaction    Review of Systems        See HPI - all other systems  neg except as noted...       The patient complains of decreased hearing, dyspnea on exertion, incontinence, arthritis all over & difficulty walking.  The patient denies anorexia, fever,  weight loss, weight gain, vision loss, hoarseness, chest pain, syncope, peripheral edema, prolonged cough, headaches, hemoptysis, abdominal pain, melena, hematochezia, severe indigestion/heartburn, hematuria, muscle weakness, suspicious skin lesions, transient blindness, depression, unusual weight change, abnormal bleeding, enlarged lymph nodes, and angioedema.     Objective:   Physical Exam     WD, WN, 77 y/o WF in NAD... GENERAL:  Alert & oriented; pleasant & cooperative... HEENT:  [place]/AT, EOM-full, PERRLA, EACs-clear, TMs-wnl, NOSE-clear, THROAT-clear & wnl. NECK:  Supple w/ fairROM; no JVD; normal carotid impulses w/o bruits; no thyromegaly or nodules palpated; no lymphadenopathy. CHEST:  Clear to P & A; without wheezes/ rales/ or rhonchi heard... HEART:  Regular Rhythm; without murmurs/ rubs/ or gallops detected... ABDOMEN:  Soft & sl tenderness, normal bowel sounds; no organomegaly or masses palpated...no guarding or rebound.  BACK:  s/p lumbar lam & scoliosis/ kyphosis EXT: without deformities, mod arthritic changes; no varicose veins/ +venous insuffic/ tr-1+ edema... NEURO:  CN's intact; no focal neuro deficits... DERM:  No lesions noted; no rash etc...   Assessment & Plan:   DJD/ LBP/ Chronic Pain Syndrome>  This is her CC & main porob; she was referred to Rheum DrDeveshwar; already seeing Dr Murray Hodgkins for Pain Management, DrOlin & DrBeane for Ortho;  See meds above.  HBP>  Remains controlled on diet alone...  Ven Insuffic>  Reminded to avoid sodium, elev legs, support hose..  CHOL>  On Simva40 + diet; last FLP 2/12 reviewed & looks good...  GI>  HH/ Dysphagia/ Divertics/ IBS/ Lymphocytic colitis>  Followed by DrDBrodie & stable, continue same...  GU>  UTIs/ Incontinence>  Managed by DrTannenbaum on Toviaz8...  Anxiety>  She declines anxiolytic meds...  ANEMIA>  Fe & B12 deficient>  On Fe Bid & B12 supplements orally, she will need f/u labs soon.Marland KitchenMarland Kitchen

## 2010-12-23 NOTE — Patient Instructions (Signed)
Today we updated your med list in our EPIC system...    Continue your current medications the same...  Stay as active as possible...  Today we gave you the 2012 Flu vaccine...  Call for any questions...  Let's plan a follow up visit in 4-6 months w/ FASTING blood work at that time.Marland KitchenMarland Kitchen

## 2010-12-24 ENCOUNTER — Encounter: Payer: Self-pay | Admitting: Pulmonary Disease

## 2011-01-02 ENCOUNTER — Encounter: Payer: Self-pay | Admitting: Pulmonary Disease

## 2011-01-08 ENCOUNTER — Other Ambulatory Visit: Payer: Self-pay | Admitting: Pulmonary Disease

## 2011-01-22 ENCOUNTER — Encounter: Payer: Self-pay | Admitting: Pulmonary Disease

## 2011-02-15 ENCOUNTER — Other Ambulatory Visit: Payer: Self-pay | Admitting: Pulmonary Disease

## 2011-02-15 ENCOUNTER — Telehealth: Payer: Self-pay | Admitting: Pulmonary Disease

## 2011-02-15 MED ORDER — AZITHROMYCIN 250 MG PO TABS: ORAL_TABLET | ORAL | Status: AC

## 2011-02-15 NOTE — Telephone Encounter (Signed)
Per SN---sounds like bronchitis---call in zpak #1  Take as directed, rest , increase in fluids and use tylenol.  Called and spoke with mary and she is aware of recs sent to the pts pharmacy.

## 2011-02-15 NOTE — Telephone Encounter (Signed)
I spoke with Corrie Dandy and she states since last night pt has been running a fever of 102.1, cough w/ yellow phlem, body aches, nausea but no vomiting, PND, sore throat, and chills. Corrie Dandy states pt has taken 2 tylenol this morning. Please advise Dr. Kriste Basque, thanks  Allergies  Allergen Reactions  . ZHY:[id number] Acid+Aspartame     REACTION: diarrhea  . Sulfonamide Derivatives     REACTION: unsure of reaction

## 2011-02-19 ENCOUNTER — Telehealth: Payer: Self-pay | Admitting: Pulmonary Disease

## 2011-02-19 MED ORDER — HYDROCOD POLST-CHLORPHEN POLST 10-8 MG/5ML PO LQCR: 5.0000 mL | Freq: Two times a day (BID) | ORAL | Status: DC | PRN

## 2011-02-19 MED ORDER — PREDNISONE (PAK) 5 MG PO TABS: ORAL_TABLET | ORAL | Status: DC

## 2011-02-19 NOTE — Telephone Encounter (Signed)
I spoke with pt and she states the zpak helped but she still feels bad. She c/o cough w/ yellow phlem, wheezing, chest tx, blowing out yellow phlem. Pt states she has been taking mucinex dm and delsym. Pt denies any nausea, vomiting, sore throat. Pt is requesting further recs from Dr. Kriste Basque, thanks  Allergies  Allergen Reactions  . EAV:[id number] Acid+Aspartame     REACTION: diarrhea  . Sulfonamide Derivatives     REACTION: unsure of reaction

## 2011-02-19 NOTE — Telephone Encounter (Signed)
Per SN: offer pred dose pak 5mg , 6 day pack - Take as directed.  tussionex 4oz 1 tsp po q12h prn, no refills.  Called spoke with patient, advised of SN's recs.  Pt okay with this and verbalized her understanding.  rx's called into verified pharmacy.

## 2011-03-12 ENCOUNTER — Encounter: Payer: Self-pay | Admitting: Adult Health

## 2011-03-12 ENCOUNTER — Ambulatory Visit (INDEPENDENT_AMBULATORY_CARE_PROVIDER_SITE_OTHER): Payer: Medicare Other | Admitting: Adult Health

## 2011-03-12 DIAGNOSIS — J209 Acute bronchitis, unspecified: Secondary | ICD-10-CM

## 2011-03-12 NOTE — Patient Instructions (Signed)
Begin Allegra 180mg  daily for drainage  Saline nasal rinses As needed   Mucinex DM Twice daily  As needed  Cough/congestoin  Nasonex 2 puffs daily until sample is gone.  Please contact office for sooner follow up if symptoms do not improve or worsen or seek emergency care  follow up Dr. Kriste Basque  In 1 month as planned

## 2011-03-12 NOTE — Assessment & Plan Note (Addendum)
Slowly resolving with lingering rhinitis symptoms   Plan:  Begin Allegra 180mg  daily for drainage  Saline nasal rinses As needed   Mucinex DM Twice daily  As needed  Cough/congestoin  Nasonex 2 puffs daily until sample is gone.  Please contact office for sooner follow up if symptoms do not improve or worsen or seek emergency care  follow up Dr. Kriste Basque  In 1 month as planned

## 2011-03-12 NOTE — Progress Notes (Signed)
Subjective:    Patient ID: Cathy [name], female    DOB: 21-Dec-1927, 78 y.o.   MRN: [id number]  HPI  78 y/o WF here for a follow up visit... she has multiple medical problems including HBP, Hyperchol, HH/ Divertics/ IBS followed by DrDBrodie; hx UTIs/ bladder problems followed by DrTannenbaum; DJD/ LBP/ Osteoporosis followed by DrBeane for Ortho; chronic pain syndrome managed by DrBartko; Anxiety, mild Anemia...  ~  August 18, 2009:  she persists w/ c/o back pain> DrBeane, DrRamos, DrJones, DrBartko- on Neurontin, Celebrex, Flector, & Vicodin (she had recent shot from [employer])... she was c/o dysphagia & had EGD 6/11 by DrDBrodie- +HH, dilated, "it helped"... recent bronchitic exac Rx'd w/ Augmentin, Mucinex, Delsym...  ~  December 16, 2009:  her CC has been her bladder- urinary incont, eval & Rx by DrTannenbaum, no help from Vesicare/ Gelnique, now getting accupuncture... asking for second opinion & I directed her to her GYN for this...  also concerned about her Ortho troubles- DrBeane did right knee injection, but she needs TKR;  ses DrBartko for pain management on Oxycodone, Flector, Neurontin, etc...  despite her stress level she refuses nerve pills...  ~  April 21, 2010:  she had right TKR 1/12 by DrBeane w/ "rough" post op course- in [place] [facility]) for rehab; she wants to go back on Celebrex & we will write this rx, but she is warned to watch for GI problems & we will monitor renal function etc; she will continue rehab from [facility] & pain management from [facility]...    She notes a mild URI w. scratchy throat & drainage> rec to use OTC Mucinex, fluids, & Chlorseptic spray vs lozenges;  BP remains stable on diet rx alone;  Chol looks good on Simva40;  GI stable on Nexium daily;  she reports still getting accupuncture for her bladder problems & now on Toviaz as well & improved...    Labs today reveal mild anemia Hg=11.8 w/ low Fe=20 (8%sat) and B12 deficient=139;  she is rec to increase her FeSO4  to Bid w/ VitC 500, and start on Vit B12 shots monthly...  ~  August 21, 2010:  93mo ROV & she is c/o arthritis pain all over, all the time> she requests Rheum consultation w/ DrDeveshwar...  She saw [facility] w/ another shot in her back 4/12 but no benefit she says; given OXYCODONE & she takes 3/d, plus NEURONTIN Qhs "for my back & legs burning", & the CELEBREX she requested last OV...  Medically stable>  BP, Chol, GI, & GU are all at baseline...  ~  December 23, 2010:  93mo ROV & she states that she is improved- feeling better, less discomfort, etc;  She had Rheum consult w/ DrDeveshwar 9/12 for her "pain all over" back surg DrJones 2010, siatica, injured thumb in door, aches & pains all over> found to have OA, Rt TKR, scoliosis, lumbar disc dis w/ fusion, vertebral compression, osteoporosis, pain management DrBartko> recs were to continue pain management DrBartko & consider Forteo Rx...    HBP> not on meds; diet controlled- BP= 138/88 and she denies CP, palpit, syncope, ch in SOB/DOE, edema, etc...    CHOL> on Simva40 w/ good control; prev FLP showed all parameters at goal & LDL 82...    LargeHH> on Nexium40 & stable w/o abd pain, n/v, dysphagia, etc...    Divertics/ IBS/ Colitis> treated 2011 for lymphocytic colitis w/ entocort & resolved...    GU> incont & bladder prob treated by DrTannenbaum; on Toviaz8  now but states she prev received a shot in her ankle but Medicare wouldn't pay...    DJD/ Back Pain, Chronic Pain> see above per DrDeveshwar & DrBartko> on Neurontin 100Bid+300Qhs, Percocet5 Q6H, Celebrex200, Flector, etc...    Osteoporosis> on Fosamax, Calcium, MVI, VitD; DrDeveshwar wanted to consider Forteo...    Anemia> on FeSO4 Bid w/ VitC and B12 orally every day (?if she ever started this)...   03/12/2011 Acute OV  Complains of Patient c/o sob, lack of energy, and nasal drainage in the morning. Denies cough and chest pain. Had bronchitis 2 weeks ago, Took zpack and prednisone pack .  Cough and congestion got better but still feels run down. Has a lot of drainage and drippy nose. No fever or discolored mucus.  No chest pain or edema.    Problem List:  ALLERGY (ICD-995.3) - she uses ALLEGRA 180mg  daily and wants to continue w/ this therapy...  HYPERTENSION (ICD-401.9) - on ASA 81mg /d, off Lasix & controlled w/ diet alone, but she notes- "I love salt" (advised restriction), not checking BP's at home, etc...    HYPERCHOLESTEROLEMIA (ICD-272.0) - on SIMVASTATIN 40mg /d now... ~  FLP 4/08 showed TChol 218, TG 86, HDL 77, LDL 117 ~  FLP 2/09 showed TChol 198, TG 48, HDL 73, LDL 116 ~  3/10- she ret on diet alone- FLP w/ TChol 263, TG 65, HDL 81, LDL 158... rec> start Simva40. ~  9/10: needs FLP but not fasting today... LFTs are WNL. ~  3/11: reminded of need for f/u FLP on the Simva40. ~  FLP 6/11 on Simva40 showed TChol 189, TG 76, HDL 77, LDL 97... same Rx. ~  FLP 2/12 on Simva40 showed TChol 160, TG 61, HDL 66, LDL 82  HIATAL HERNIA (ICD-553.3) - on NEXIUM 40mg /d... EGD 11/03 by DrDBrodie showed 5cm HH, & esoph stricture dilated... developed dysphagia  5/11 w/ repeat EGD by DrDBrodie> large HH, dilated & improved (may need HH surg)...   ~  She denies nausea, vomiting, heartburn, diarrhea, constipation, blood in stool, abdominal pain, swelling, gas...  DIVERTICULOSIS OF COLON (ICD-562.10) & IRRITABLE BOWEL SYNDROME (ICD-564.1) -  ~  last colonoscopy was 11/03 by DrDBrodie & showed divertics only... f/u 61yrs. ~  9/10:  she notes some constipation & Questran discontinued. ~  2/11:  FlexSig in hosp showed lymphocytic colitis- Rx'd Entocort & resolved...  S/P CHOLECYSTECTOMY for Stones 2/11 by DrHoxsworth...  UTI (ICD-599.0) - prev evals by GYN DrMcPhail, and Urology DrTannenbaum... she has frequency, urge & stress incontinence, and a hypotonic bladder- s/p pubovag sling surg for incont... they tried Bethanachol (she stopped this med) & Gelnique (she states didn't help)... ~   Urology f/u by DrTannenbaum 12/10 reviewed & stable w/ phys therapy.  HYSTERECTOMY, HX OF (ICD-V45.77) - her last GYN was DrMcPhail before he retired...  DEGENERATIVE JOINT DISEASE (ICD-715.90) - on CELEBREX (uses it Prn)... followed by DrBeane/Olin for Ortho, DrDeveshwar for Rheum, & DrJones for Neurosurg... she has had right knee injections followed by right TKR 1/12 per DrBeane.  BACK PAIN, LUMBAR (ICD-724.2) - she has chronic kyphoscoliosis... DrBeane has evaluated and Rx w/ shots, Pred, pain meds... she has seen DrRamos & had selective L5 nerve root block (without benefit)... second opinion from DrDJones, Neurosurg w/ lumbar laminectomy 5/10 providing temp relief... Now followed by [facility] for PAIN MANAGEMENT... ~  9/10: c/o recurrent LBP w/ MRI per DrJones and epid shot planned per DrBartko... now on FLECTOR Prn, NEURONTIN 100mg - 2Tid, VICODIN up to 3 per day... ~  12/10: note from [facility] reviewed- pain management. ~  5/11:  seen by [facility] for another shot, Vicodin changed to OXYCODONE. ~  She has been seen by DrDeveshwar for rheum & she rec continued pain management by DrBartko...  OSTEOPENIA (ICD-733.90) - she takes ALENDRONATE, Ca++, MVI, Vit D... last BMD was 6/08 [facility] & showed TScores from -2.0 to -2.7, sl better than in 2005... ~  Vit D level 2/09 was 30 (30-90) and rec to take Vit D 1000 u daily supplement...  ANXIETY (ICD-300.00) - she declines anxiolytic Rx despite her anxiety...  ANEMIA OF CHRONIC DISEASE (ICD-285.29) -  taking FEOSOL 200mg  Bid w/ VitC now & added Vit B12 shots 2/12. ~  labs 2/09 showed Hg= 10.4 & MCV= 90... ~  labs 3/10 showed Hg= 12.9 ~  labs 9/10 showed Hg= 12.0, Fe= 89 ~  labs 3/11 showed Hg= 10.7, Fe= 55, TIBC= 119, Sat=35%. ~  labs 6/11 showed Hg= 11.8, Fe= 100 (37%sat). ~  Labs 2/12 showed Hg= 11.8, Fe= 20 (8%sat), Vit B12= 139...   VITAMIN B-12 DEFICIENCY>  She was supposed to have started on oral Vit B12 supplements 2/12... ~  Labs  2/12 showed Vit B12 level = 139... rec to start oral B12 supplement ~1072mcg daily...   Past Surgical History  Procedure Date  . Cataract surgery   . Abdominal hysterectomy   . Decompressive lumbar laminectomy 06/2008    at L2-3, L3-4 and L4-5 by Dr. Yetta Barre  . Laparoscopic cholecystectomy 04/2009    Dr. Odie Sera    Outpatient Encounter Prescriptions as of 03/12/2011  Medication Sig Dispense Refill  . aspirin 81 MG tablet Take 81 mg by mouth daily.        . calcium-vitamin D (OSCAL WITH D) 500-200 MG-UNIT per tablet Take 1 tablet by mouth daily.        . celecoxib (CELEBREX) 200 MG capsule Take 1 capsule (200 mg total) by mouth daily. As needed for joint pain  90 capsule  3  . chlorpheniramine-HYDROcodone (TUSSIONEX PENNKINETIC ER) 10-8 MG/5ML LQCR Take 5 mLs by mouth every 12 (twelve) hours as needed.  240 mL  0  . Cholecalciferol (VITAMIN D) 1000 UNITS capsule Take 1,000 Units by mouth daily.        . diclofenac (FLECTOR) 1.3 % PTCH Apply as directed by Dr. Murray Hodgkins       . esomeprazole (NEXIUM) 40 MG capsule Take 40 mg by mouth daily before breakfast.        . Ferrous Sulfate Dried (FEOSOL) 200 (65 FE) MG TABS Take 1 tablet by mouth 2 (two) times daily. Take with a vitamin c 500mg        . Fesoterodine Fumarate (TOVIAZ) 8 MG TB24 Take 8 mg by mouth daily. Per Dr. Patsi Sears       . fexofenadine (ALLEGRA) 180 MG tablet Take 180 mg by mouth daily. As needed for allergies        . FORTEO 600 MCG/2.4ML SOLN Daily.      [name] gabapentin (NEURONTIN) 300 MG capsule Take 300 mg by mouth at bedtime.       [name] oxyCODONE-acetaminophen (PERCOCET) 5-325 MG per tablet Take 1 tablet by mouth every 6 (six) hours as needed.        . simvastatin (ZOCOR) 40 MG tablet Take 40 mg by mouth at bedtime.        [name] DISCONTD: gabapentin (NEURONTIN) 100 MG capsule TAKE 2 CAPSULES THREE TIMES A DAY  540 capsule  5  .  DISCONTD: gabapentin (NEURONTIN) 100 MG tablet Take 2 tablets  By mouth two times daily       . alendronate  (FOSAMAX) 70 MG tablet TAKE 1 TABLET EVERY WEEK  4 tablet  5  . predniSONE (STERAPRED UNI-PAK) 5 MG TABS Take as directed  1 each  0    Allergies  Allergen Reactions  . ZOX:[id number] Acid+Aspartame     REACTION: diarrhea  . Sulfonamide Derivatives     REACTION: unsure of reaction    Review of Systems        Constitutional:   No  weight loss, night sweats,  Fevers, chills,  +fatigue, or  lassitude.  HEENT:   No headaches,  Difficulty swallowing,  Tooth/dental problems, or  Sore throat,                No sneezing, itching, ear ache, + nasal congestion, post nasal drip,   CV:  No chest pain,  Orthopnea, PND, swelling in lower extremities, anasarca, dizziness, palpitations, syncope.   GI  No heartburn, indigestion, abdominal pain, nausea, vomiting, diarrhea, change in bowel habits, loss of appetite, bloody stools.   Resp:     No coughing up of blood.  No change in color of mucus.  No wheezing.  No chest wall deformity  Skin: no rash or lesions.  GU: no dysuria, change in color of urine, no urgency or frequency.  No flank pain, no hematuria   MS:  No joint pain or swelling.  No decreased range of motion.  No back pain.  Psych:  No change in mood or affect. No depression or anxiety.  No memory loss.       Objective:   Physical Exam     WD, WN, 78 y/o WF in NAD... GENERAL:  Alert & oriented; pleasant & cooperative... HEENT:  [place]/AT, EOM-full, PERRLA, EACs-clear, TMs-wnl, NOSE-clear drainage , THROAT-clear & wnl. NECK:  Supple w/ fairROM; no JVD; normal carotid impulses w/o bruits; no thyromegaly or nodules palpated; no lymphadenopathy. CHEST:  Clear to P & A; without wheezes/ rales/ or rhonchi heard... HEART:  Regular Rhythm; without murmurs/ rubs/ or gallops detected... ABDOMEN:  Soft &  normal bowel sounds; no organomegaly or masses palpated...no guarding or rebound.  BACK:  s/p lumbar lam & scoliosis/ kyphosis EXT: without deformities, mod  arthritic changes; no varicose veins/ +venous insuffic/ tr-1+ edema... NEURO:   no focal neuro deficits... DERM:  No lesions noted; no rash etc...   Assessment & Plan:

## 2011-04-15 ENCOUNTER — Other Ambulatory Visit: Payer: Self-pay | Admitting: Pulmonary Disease

## 2011-04-28 ENCOUNTER — Ambulatory Visit (INDEPENDENT_AMBULATORY_CARE_PROVIDER_SITE_OTHER): Payer: Medicare Other | Admitting: Pulmonary Disease

## 2011-04-28 ENCOUNTER — Other Ambulatory Visit (INDEPENDENT_AMBULATORY_CARE_PROVIDER_SITE_OTHER): Payer: Medicare Other

## 2011-04-28 ENCOUNTER — Encounter: Payer: Self-pay | Admitting: Pulmonary Disease

## 2011-04-28 DIAGNOSIS — M949 Disorder of cartilage, unspecified: Secondary | ICD-10-CM

## 2011-04-28 DIAGNOSIS — E7800 Pure hypercholesterolemia, unspecified: Secondary | ICD-10-CM

## 2011-04-28 DIAGNOSIS — K589 Irritable bowel syndrome without diarrhea: Secondary | ICD-10-CM

## 2011-04-28 DIAGNOSIS — I10 Essential (primary) hypertension: Secondary | ICD-10-CM

## 2011-04-28 DIAGNOSIS — D649 Anemia, unspecified: Secondary | ICD-10-CM

## 2011-04-28 DIAGNOSIS — F411 Generalized anxiety disorder: Secondary | ICD-10-CM

## 2011-04-28 DIAGNOSIS — K449 Diaphragmatic hernia without obstruction or gangrene: Secondary | ICD-10-CM

## 2011-04-28 DIAGNOSIS — E538 Deficiency of other specified B group vitamins: Secondary | ICD-10-CM

## 2011-04-28 DIAGNOSIS — M1990 Unspecified osteoarthritis, unspecified site: Secondary | ICD-10-CM

## 2011-04-28 DIAGNOSIS — D509 Iron deficiency anemia, unspecified: Secondary | ICD-10-CM

## 2011-04-28 DIAGNOSIS — M899 Disorder of bone, unspecified: Secondary | ICD-10-CM

## 2011-04-28 DIAGNOSIS — M545 Low back pain, unspecified: Secondary | ICD-10-CM

## 2011-04-28 DIAGNOSIS — K5730 Diverticulosis of large intestine without perforation or abscess without bleeding: Secondary | ICD-10-CM

## 2011-04-28 DIAGNOSIS — I872 Venous insufficiency (chronic) (peripheral): Secondary | ICD-10-CM

## 2011-04-28 LAB — CBC WITH DIFFERENTIAL/PLATELET
Basophils Absolute: 0 10*3/uL (ref 0.0–0.1)
Eosinophils Relative: 2.3 % (ref 0.0–5.0)
HCT: 36.3 % (ref 36.0–46.0)
Hemoglobin: 12.1 g/dL (ref 12.0–15.0)
Lymphocytes Relative: 23.8 % (ref 12.0–46.0)
Lymphs Abs: 1.2 10*3/uL (ref 0.7–4.0)
Monocytes Relative: 9.6 % (ref 3.0–12.0)
Neutro Abs: 3.3 10*3/uL (ref 1.4–7.7)
Platelets: 160 10*3/uL (ref 150.0–400.0)
RDW: 13.5 % (ref 11.5–14.6)
WBC: 5.2 10*3/uL (ref 4.5–10.5)

## 2011-04-28 LAB — LIPID PANEL
Cholesterol: 145 mg/dL (ref 0–200)
LDL Cholesterol: 59 mg/dL (ref 0–99)
Triglycerides: 31 mg/dL (ref 0.0–149.0)

## 2011-04-28 LAB — BASIC METABOLIC PANEL
BUN: 11 mg/dL (ref 6–23)
Chloride: 107 mEq/L (ref 96–112)
Creatinine, Ser: 0.7 mg/dL (ref 0.4–1.2)
Glucose, Bld: 79 mg/dL (ref 70–99)

## 2011-04-28 LAB — TSH: TSH: 1.13 u[IU]/mL (ref 0.35–5.50)

## 2011-04-28 LAB — HEPATIC FUNCTION PANEL
Albumin: 3.9 g/dL (ref 3.5–5.2)
Total Protein: 6.2 g/dL (ref 6.0–8.3)

## 2011-04-28 LAB — IBC PANEL
Saturation Ratios: 45.1 % (ref 20.0–50.0)
Transferrin: 172.7 mg/dL — ABNORMAL LOW (ref 212.0–360.0)

## 2011-04-28 MED ORDER — CEPHALEXIN 500 MG PO CAPS: ORAL_CAPSULE | ORAL | Status: DC

## 2011-04-28 NOTE — Patient Instructions (Signed)
Today we updated your med list in our EPIC system...    Continue your current medications the same...  Today we did your follow up fasting blood work...    Please call the [facility] in a few days for your results...    Dial [id number] & when prompted enter your patient number followed by the # symbol...    Your patient number is:  [phone]#  Stay as active as poss...  Call for any questions...  Let's plan a follow up visit in 4-6 months.Marland KitchenMarland Kitchen

## 2011-04-28 NOTE — Progress Notes (Signed)
Subjective:    Patient ID: Cathy [name], female    DOB: 04-06-1927, 78 y.o.   MRN: [id number]  HPI 78 y/o WF here for a follow up visit... she has multiple medical problems including HBP, Hyperchol, HH/ Divertics/ IBS followed by DrDBrodie; hx UTIs/ bladder problems followed by DrTannenbaum; DJD/ LBP/ Osteoporosis followed by DrBeane for Ortho; chronic pain syndrome managed by DrBartko; Anxiety, mild Anemia...  ~  April 21, 2010:  she had right TKR 1/12 by DrBeane w/ "rough" post op course- in [place] [facility]) for rehab; she wants to go back on Celebrex & we will write this rx, but she is warned to watch for GI problems & we will monitor renal function etc; she will continue rehab from [facility] & pain management from [facility]...    She notes a mild URI w/ scratchy throat & drainage> rec to use OTC Mucinex, fluids, & Chlorseptic spray vs lozenges;  BP remains stable on diet rx alone;  Chol looks good on Simva40;  GI stable on Nexium daily;  she reports still getting accupuncture for her bladder problems & now on Toviaz as well & improved...    Labs today reveal mild anemia Hg=11.8 w/ low Fe=20 (8%sat) and B12 deficient=139;  she is rec to increase her FeSO4 to Bid w/ VitC 500, and start on Vit B12 shots monthly...  ~  August 21, 2010:  2mo ROV & she is c/o arthritis pain all over, all the time> she requests Rheum consultation w/ DrDeveshwar...  She saw [facility] w/ another shot in her back 4/12 but no benefit she says; given OXYCODONE & she takes 3/d, plus NEURONTIN Qhs "for my back & legs burning", & the CELEBREX she requested last OV...  Medically stable>  BP, Chol, GI, & GU are all at baseline...  ~  December 23, 2010:  2mo ROV & she states that she is improved- feeling better, less discomfort, etc;  She had Rheum consult w/ DrDeveshwar 9/12 for her "pain all over" back surg DrJones 2010, siatica, injured thumb in door, aches & pains all over> found to have OA, Rt TKR, scoliosis, lumbar disc dis w/  fusion, vertebral compression, osteoporosis, pain management DrBartko> recs were to continue pain management DrBartko & consider Forteo Rx...    HBP> not on meds; diet controlled- BP= 138/88 and she denies CP, palpit, syncope, ch in SOB/DOE, edema, etc...    CHOL> on Simva40 w/ good control; prev FLP showed all parameters at goal & LDL 82...    LargeHH> on Nexium40 & stable w/o abd pain, n/v, dysphagia, etc...    Divertics/ IBS/ Colitis> treated 2011 for lymphocytic colitis w/ entocort & resolved...    GU> incont & bladder prob treated by DrTannenbaum; on Toviaz8 now but states she prev received a shot in her ankle but Medicare wouldn't pay...    DJD/ Back Pain, Chronic Pain> see above per DrDeveshwar & DrBartko> on Neurontin 100Bid+300Qhs, Percocet5 Q6H, Celebrex200, Flector, etc...    Osteoporosis> on Fosamax, Calcium, MVI, VitD; DrDeveshwar wanted to consider Forteo...    Anemia> on FeSO4 Bid w/ VitC and B12 orally every day (?if she ever started this)...  ~  April 28, 2011:  2mo ROV & she continues to c/o pain all over & has mult meds per DrDeveshwar & Bartko> Percocet, Celebrex, Flector, Neurontin, & now on Forteo shots daily (started 12/12); she has difficulty standing, walking, etc...    She saw TP 1/13 w/ bronchitis> Rx Zpak & Pred, now improved  just chr weak/ tired/ feeling bad/ mult somatic complaints...    BP now stable on diet alone & measures 122/70 today;  Chol looks good on Simva40;  GI symptoms stable on Nexium...    Fasting labs today all looked good> see below...   Problem List:  ALLERGY (ICD-995.3) - she uses ALLEGRA 180mg  daily and wants to continue w/ this therapy...  HYPERTENSION (ICD-401.9) - on ASA 81mg /d, off Lasix & controlled w/ diet alone, but she notes- "I love salt" (advised restriction), not checking BP's at home, etc...   ~  CXR 2/11 showed normal heart size, sl decr lung volumes but clear, DJD in spine...  HYPERCHOLESTEROLEMIA (ICD-272.0) - on  SIMVASTATIN 40mg /d now... ~  FLP 4/08 showed TChol 218, TG 86, HDL 77, LDL 117 ~  FLP 2/09 showed TChol 198, TG 48, HDL 73, LDL 116 ~  3/10- she ret on diet alone- FLP w/ TChol 263, TG 65, HDL 81, LDL 158... rec> start Simva40. ~  9/10: needs FLP but not fasting today... LFTs are WNL. ~  3/11: reminded of need for f/u FLP on the Simva40. ~  FLP 6/11 on Simva40 showed TChol 189, TG 76, HDL 77, LDL 97... same Rx. ~  FLP 2/12 on Simva40 showed TChol 160, TG 61, HDL 66, LDL 82 ~  FLP 2/13 on Simva40 showed TChol 145, TG 31, HDL 80, LDL 59  HIATAL HERNIA (ICD-553.3) - on NEXIUM 40mg /d... EGD 11/03 by DrDBrodie showed 5cm HH, & esoph stricture dilated... developed dysphagia  5/11 w/ repeat EGD by DrDBrodie> large HH, dilated & improved (may need HH surg)...   ~  CT Abd 2/11 showed large HH; bilat sm effusions & atx; DJD & post op changes in spine; redundant colon & few divertics; increased size of mult hypodense splenic masses & left adrenal mass ?etiology... ~  Ba swallow 2/11 showed large HH, presbyesoph... ~  She denies nausea, vomiting, heartburn, diarrhea, constipation, blood in stool, abdominal pain, swelling, gas...  DIVERTICULOSIS OF COLON (ICD-562.10) & IRRITABLE BOWEL SYNDROME (ICD-564.1) -  ~  last colonoscopy was 11/03 by DrDBrodie & showed divertics only... f/u 73yrs. ~  9/10:  she notes some constipation & Questran discontinued. ~  2/11:  FlexSig in hosp showed lymphocytic colitis- Rx'd Entocort & resolved...  S/P CHOLECYSTECTOMY for Stones 2/11 by DrHoxsworth...  UTI (ICD-599.0) - prev evals by GYN DrMcPhail, and Urology DrTannenbaum... she has frequency, urge & stress incontinence, and a hypotonic bladder- s/p pubovag sling surg for incont... they tried Bethanachol (she stopped this med) & Gelnique (she states didn't help)... ~  Urology f/u by DrTannenbaum 12/10 reviewed & stable w/ phys therapy.  HYSTERECTOMY, HX OF (ICD-V45.77) - her last GYN was DrMcPhail before he  retired...  DEGENERATIVE JOINT DISEASE (ICD-715.90) - on CELEBREX (uses it Prn)... followed by DrBeane/Olin for Ortho, DrDeveshwar for Rheum, & DrJones for Neurosurg... she has had right knee injections followed by right TKR 1/12 per DrBeane.  BACK PAIN, LUMBAR (ICD-724.2) - she has chronic kyphoscoliosis... DrBeane has evaluated and Rx w/ shots, Pred, pain meds... she has seen DrRamos & had selective L5 nerve root block (without benefit)... second opinion from DrDJones, Neurosurg w/ lumbar laminectomy 5/10 providing temp relief... Now followed by [facility] for PAIN MANAGEMENT... ~  9/10: c/o recurrent LBP w/ MRI per DrJones and epid shot planned per DrBartko... now on FLECTOR Prn, NEURONTIN 100mg - 2Tid, VICODIN up to 3 per day... ~  12/10: note from [facility] reviewed- pain management. ~  5/11:  seen  by [facility] for another shot, Vicodin changed to OXYCODONE. ~  She has been seen by Rober Minion for Rheum & she rec continued pain management by DrBartko...  OSTEOPENIA (ICD-733.90) - she takes Ca++, MVI, Vit D; prev on Alendronte & DrDeveshwar changed her to [facility] 12/12...  ~  BMD 6/08 at [facility] & showed TScores from -2.0 to -2.7, sl better than in 2005... ~  Vit D level 2/09 was 30 (30-90) and rec to take Vit D 1000 u daily supplement... ~  BMD 10/12 at [facility] showed TScore at lowest measured site= -4.3 in left forearm... ~  12/12:  DrDeveshwar Rx w/ FORTEO starting 12/12... ~  Labs 2/13 showed Vit D level = 32  ANXIETY (ICD-300.00) - she declines anxiolytic Rx despite her anxiety...  ANEMIA OF CHRONIC DISEASE (ICD-285.29) -  taking FEOSOL 200mg  Bid w/ VitC now & added Vit B12 shots 2/12. ~  labs 2/09 showed Hg= 10.4 & MCV= 90... ~  labs 3/10 showed Hg= 12.9 ~  labs 9/10 showed Hg= 12.0, Fe= 89 ~  labs 3/11 showed Hg= 10.7, Fe= 55, TIBC= 119, Sat=35%. ~  labs 6/11 showed Hg= 11.8, Fe= 100 (37%sat). ~  Labs 2/12 showed Hg= 11.8, Fe= 20 (8%sat), Vit B12= 139 ~  Labs 2/13 showed Hg= 12.1, Fe=  109 (45%sat), B12= 1373  VITAMIN B-12 DEFICIENCY>  She was supposed to have started on oral Vit B12 supplements 2/12... ~  Labs 2/12 showed Vit B12 level = 139... rec to start oral B12 supplement ~1052mcg daily...   Past Surgical History  Procedure Date  . Cataract surgery   . Abdominal hysterectomy   . Decompressive lumbar laminectomy 06/2008    at L2-3, L3-4 and L4-5 by Dr. Yetta Barre  . Laparoscopic cholecystectomy 04/2009    Dr. Odie Sera    Outpatient Encounter Prescriptions as of 04/28/2011  Medication Sig Dispense Refill  . aspirin 81 MG tablet Take 81 mg by mouth daily.        . calcium-vitamin D (OSCAL WITH D) 500-200 MG-UNIT per tablet Take 1 tablet by mouth daily.        . celecoxib (CELEBREX) 200 MG capsule Take 1 capsule (200 mg total) by mouth daily. As needed for joint pain  90 capsule  3  . Cholecalciferol (VITAMIN D) 1000 UNITS capsule Take 1,000 Units by mouth daily.        . diclofenac (FLECTOR) 1.3 % PTCH Apply as directed by Dr. Murray Hodgkins       . Ferrous Sulfate Dried (FEOSOL) 200 (65 FE) MG TABS Take 1 tablet by mouth 2 (two) times daily. Take with a vitamin c 500mg        . Fesoterodine Fumarate (TOVIAZ) 8 MG TB24 Take 8 mg by mouth daily. Per Dr. Patsi Sears       . fexofenadine (ALLEGRA) 180 MG tablet Take 180 mg by mouth daily. As needed for allergies        . FORTEO 600 MCG/2.4ML SOLN Daily.      [name] gabapentin (NEURONTIN) 300 MG capsule Take 300 mg by mouth at bedtime.       [name] NEXIUM 40 MG capsule TAKE 1 CAPSULE DAILY  90 capsule  2  . oxyCODONE-acetaminophen (PERCOCET) 5-325 MG per tablet Take 1 tablet by mouth every 6 (six) hours as needed.        . simvastatin (ZOCOR) 40 MG tablet TAKE 1 TABLET AT BEDTIME  90 tablet  2  . DISCONTD: alendronate (FOSAMAX) 70 MG tablet TAKE 1 TABLET  EVERY WEEK  4 tablet  5  . DISCONTD: chlorpheniramine-HYDROcodone (TUSSIONEX PENNKINETIC ER) 10-8 MG/5ML LQCR Take 5 mLs by mouth every 12 (twelve) hours as needed.  240 mL  0  . DISCONTD:  predniSONE (STERAPRED UNI-PAK) 5 MG TABS Take as directed  1 each  0    Allergies  Allergen Reactions  . UJW:[id number] Acid+Aspartame     REACTION: diarrhea  . Sulfonamide Derivatives     REACTION: unsure of reaction    Current Medications, Allergies, Past Medical History, Past Surgical History, Family History, and Social History were reviewed in [employer] record.    Review of Systems        See HPI - all other systems neg except as noted...       The patient complains of decreased hearing, dyspnea on exertion, incontinence, arthritis all over & difficulty walking.  The patient denies anorexia, fever, weight loss, weight gain, vision loss, hoarseness, chest pain, syncope, peripheral edema, prolonged cough, headaches, hemoptysis, abdominal pain, melena, hematochezia, severe indigestion/heartburn, hematuria, muscle weakness, suspicious skin lesions, transient blindness, depression, unusual weight change, abnormal bleeding, enlarged lymph nodes, and angioedema.     Objective:   Physical Exam     WD, WN, 78 y/o WF in NAD... GENERAL:  Alert & oriented; pleasant & cooperative... HEENT:  [place]/AT, EOM-full, PERRLA, EACs-clear, TMs-wnl, NOSE-clear, THROAT-clear & wnl. NECK:  Supple w/ fairROM; no JVD; normal carotid impulses w/o bruits; no thyromegaly or nodules palpated; no lymphadenopathy. CHEST:  Clear to P & A; without wheezes/ rales/ or rhonchi heard... HEART:  Regular Rhythm; without murmurs/ rubs/ or gallops detected... ABDOMEN:  Soft & sl tenderness, normal bowel sounds; no organomegaly or masses palpated...no guarding or rebound.  BACK:  s/p lumbar lam & scoliosis/ kyphosis EXT: without deformities, mod arthritic changes; no varicose veins/ +venous insuffic/ tr-1+ edema... NEURO:  CN's intact; no focal neuro deficits... DERM:  No lesions noted; no rash etc...   Assessment & Plan:   DJD/ LBP/ Chronic Pain Syndrome>  This is her CC &  main porob; she was referred to Rheum DrDeveshwar; already seeing Dr Murray Hodgkins for Pain Management, DrOlin & DrBeane for Ortho;  See meds above & continue same...   HBP>  Remains controlled on diet alone...  Ven Insuffic>  Reminded to avoid sodium, elev legs, support hose..  CHOL>  On Simva40 + diet;  F/u FLP looks good...  GI>  HH/ Dysphagia/ Divertics/ IBS/ Lymphocytic colitis>  Followed by DrDBrodie & stable, continue same...  GU>  UTIs/ Incontinence>  Managed by DrTannenbaum on Toviaz8...  Anxiety>  She declines anxiolytic meds...  ANEMIA>  Fe & B12 deficient>  On oral supplements her CBC, Fe, B12 are all better...   Patient's Medications  New Prescriptions   CEPHALEXIN (KEFLEX) 500 MG CAPSULE    Take 4 tablets by mouth 1 hour prior to dental procedure  Previous Medications   ASPIRIN 81 MG TABLET    Take 81 mg by mouth daily.     CALCIUM-VITAMIN D (OSCAL WITH D) 500-200 MG-UNIT PER TABLET    Take 1 tablet by mouth daily.     CELECOXIB (CELEBREX) 200 MG CAPSULE    Take 1 capsule (200 mg total) by mouth daily. As needed for joint pain   CHOLECALCIFEROL (VITAMIN D) 1000 UNITS CAPSULE    Take 1,000 Units by mouth daily.     DICLOFENAC (FLECTOR) 1.3 % PTCH    Apply as directed by Dr. Ardelle Balls SULFATE DRIED (  FEOSOL) 200 (65 FE) MG TABS    Take 1 tablet by mouth 2 (two) times daily. Take with a vitamin c 500mg     FESOTERODINE FUMARATE (TOVIAZ) 8 MG TB24    Take 8 mg by mouth daily. Per Dr. Patsi Sears    FEXOFENADINE (ALLEGRA) 180 MG TABLET    Take 180 mg by mouth daily. As needed for allergies     FORTEO 600 MCG/2.4ML SOLN    Daily.   GABAPENTIN (NEURONTIN) 300 MG CAPSULE    Take 300 mg by mouth at bedtime.    NEXIUM 40 MG CAPSULE    TAKE 1 CAPSULE DAILY   OXYCODONE-ACETAMINOPHEN (PERCOCET) 5-325 MG PER TABLET    Take 1 tablet by mouth every 6 (six) hours as needed.     SIMVASTATIN (ZOCOR) 40 MG TABLET    TAKE 1 TABLET AT BEDTIME  Modified Medications   No medications on file   Discontinued Medications   ALENDRONATE (FOSAMAX) 70 MG TABLET    TAKE 1 TABLET EVERY WEEK   CHLORPHENIRAMINE-HYDROCODONE (TUSSIONEX PENNKINETIC ER) 10-8 MG/5ML LQCR    Take 5 mLs by mouth every 12 (twelve) hours as needed.   PREDNISONE (STERAPRED UNI-PAK) 5 MG TABS    Take as directed

## 2011-04-29 LAB — VITAMIN D 25 HYDROXY (VIT D DEFICIENCY, FRACTURES): Vit D, 25-Hydroxy: 32 ng/mL (ref 30–89)

## 2011-06-28 ENCOUNTER — Telehealth: Payer: Self-pay | Admitting: Pulmonary Disease

## 2011-06-28 MED ORDER — DOXYCYCLINE HYCLATE 100 MG PO TABS: 100.0000 mg | ORAL_TABLET | Freq: Every day | ORAL | Status: DC

## 2011-06-28 NOTE — Telephone Encounter (Signed)
Per SN---take plain mucinex  600mg    2 po bid with plenty of fluids and doxycycline 100mg   #10  1 daily until gone.  thanks

## 2011-06-28 NOTE — Telephone Encounter (Signed)
Spoke with pt and notified of recs per SN. Pt verbalized understanding and denied any questions. Rx was sent to pharm.

## 2011-06-28 NOTE — Telephone Encounter (Signed)
I spoke with pt and she c/o cough w/ yellow-green phlem x 2 days. Denies any f/c/s/n/v/sob/wheezing.chest tx. She has been taking mucinex d and tussionex cough syrup 1 tsp q 12 hrs. Pt is requesting further recs from SN. Please advise thanks  Allergies  Allergen Reactions  . WUJ:[id number]+QIONGEXBMW Acid+Aspartame     REACTION: diarrhea  . Sulfonamide Derivatives     REACTION: unsure of reaction

## 2011-06-29 ENCOUNTER — Telehealth: Payer: Self-pay | Admitting: Pulmonary Disease

## 2011-06-29 MED ORDER — DOXYCYCLINE HYCLATE 100 MG PO TABS: 100.0000 mg | ORAL_TABLET | Freq: Every day | ORAL | Status: AC

## 2011-06-29 NOTE — Telephone Encounter (Signed)
RX was sent to [facility] by mistake yesterday. I called them to cancel this. I have sent new rx into [facility] and pt is aware. Nothing further was needed

## 2011-08-25 ENCOUNTER — Encounter: Payer: Self-pay | Admitting: Pulmonary Disease

## 2011-08-25 ENCOUNTER — Ambulatory Visit (INDEPENDENT_AMBULATORY_CARE_PROVIDER_SITE_OTHER): Payer: Medicare Other | Admitting: Pulmonary Disease

## 2011-08-25 VITALS — BP 128/64 | HR 67 | Temp 97.0°F | Ht 59.0 in | Wt 129.4 lb

## 2011-08-25 DIAGNOSIS — D649 Anemia, unspecified: Secondary | ICD-10-CM

## 2011-08-25 DIAGNOSIS — M545 Low back pain, unspecified: Secondary | ICD-10-CM

## 2011-08-25 DIAGNOSIS — I10 Essential (primary) hypertension: Secondary | ICD-10-CM

## 2011-08-25 DIAGNOSIS — K5730 Diverticulosis of large intestine without perforation or abscess without bleeding: Secondary | ICD-10-CM

## 2011-08-25 DIAGNOSIS — M899 Disorder of bone, unspecified: Secondary | ICD-10-CM

## 2011-08-25 DIAGNOSIS — I872 Venous insufficiency (chronic) (peripheral): Secondary | ICD-10-CM

## 2011-08-25 DIAGNOSIS — R32 Unspecified urinary incontinence: Secondary | ICD-10-CM

## 2011-08-25 DIAGNOSIS — M1990 Unspecified osteoarthritis, unspecified site: Secondary | ICD-10-CM

## 2011-08-25 DIAGNOSIS — E7800 Pure hypercholesterolemia, unspecified: Secondary | ICD-10-CM

## 2011-08-25 DIAGNOSIS — K589 Irritable bowel syndrome without diarrhea: Secondary | ICD-10-CM

## 2011-08-25 DIAGNOSIS — K449 Diaphragmatic hernia without obstruction or gangrene: Secondary | ICD-10-CM

## 2011-08-25 MED ORDER — PREGABALIN 50 MG PO CAPS: 50.0000 mg | ORAL_CAPSULE | Freq: Three times a day (TID) | ORAL | Status: DC

## 2011-08-25 NOTE — Patient Instructions (Addendum)
Today we updated your med list in our EPIC system...    Continue your current medications the same...  We refilled your meds per request...  Call for any questions...  Let's plan a follow up visit in another 4 months.Marland KitchenMarland Kitchen

## 2011-08-25 NOTE — Progress Notes (Signed)
Subjective:    Patient ID: Cathy [name], female    DOB: 04-06-1927, 78 y.o.   MRN: [id number]  HPI 78 y/o WF here for a follow up visit... she has multiple medical problems including HBP, Hyperchol, HH/ Divertics/ IBS followed by DrDBrodie; hx UTIs/ bladder problems followed by DrTannenbaum; DJD/ LBP/ Osteoporosis followed by DrBeane for Ortho; chronic pain syndrome managed by DrBartko; Anxiety, mild Anemia...  ~  April 21, 2010:  she had right TKR 1/12 by DrBeane w/ "rough" post op course- in [place] [facility] nurse) for rehab; she wants to go back on Celebrex & we will write this rx, but she is warned to watch for GI problems & we will monitor renal function etc; she will continue rehab from [facility] & pain management from [facility]...    She notes a mild URI w/ scratchy throat & drainage> rec to use OTC Mucinex, fluids, & Chlorseptic spray vs lozenges;  BP remains stable on diet rx alone;  Chol looks good on Simva40;  GI stable on Nexium daily;  she reports still getting accupuncture for her bladder problems & now on Toviaz as well & improved...    Labs today reveal mild anemia Hg=11.8 w/ low Fe=20 (8%sat) and B12 deficient=139;  she is rec to increase her FeSO4 to Bid w/ VitC 500, and start on Vit B12 shots monthly...  ~  August 21, 2010:  2mo ROV & she is c/o arthritis pain all over, all the time> she requests Rheum consultation w/ DrDeveshwar...  She saw [facility] w/ another shot in her back 4/12 but no benefit she says; given OXYCODONE & she takes 3/d, plus NEURONTIN Qhs "for my back & legs burning", & the CELEBREX she requested last OV...  Medically stable>  BP, Chol, GI, & GU are all at baseline...  ~  December 23, 2010:  2mo ROV & she states that she is improved- feeling better, less discomfort, etc;  She had Rheum consult w/ DrDeveshwar 9/12 for her "pain all over" back surg DrJones 2010, siatica, injured thumb in door, aches & pains all over> found to have OA, Rt TKR, scoliosis, lumbar disc dis w/  fusion, vertebral compression, osteoporosis, pain management DrBartko> recs were to continue pain management DrBartko & consider Forteo Rx...    HBP> not on meds; diet controlled- BP= 138/88 and she denies CP, palpit, syncope, ch in SOB/DOE, edema, etc...    CHOL> on Simva40 w/ good control; prev FLP showed all parameters at goal & LDL 82...    LargeHH> on Nexium40 & stable w/o abd pain, n/v, dysphagia, etc...    Divertics/ IBS/ Colitis> treated 2011 for lymphocytic colitis w/ entocort & resolved...    GU> incont & bladder prob treated by DrTannenbaum; on Toviaz8 now but states she prev received a shot in her ankle but Medicare wouldn't pay...    DJD/ Back Pain, Chronic Pain> see above per DrDeveshwar & DrBartko> on Neurontin 100Bid+300Qhs, Percocet5 Q6H, Celebrex200, Flector, etc...    Osteoporosis> on Fosamax, Calcium, MVI, VitD; DrDeveshwar wanted to consider Forteo...    Anemia> on FeSO4 Bid w/ VitC and B12 orally every day (?if she ever started this)...  ~  April 28, 2011:  2mo ROV & she continues to c/o pain all over & has mult meds per DrDeveshwar & Bartko> Percocet, Celebrex, Flector, Neurontin, & now on Forteo shots daily (started 12/12); she has difficulty standing, walking, etc...    She saw TP 1/13 w/ bronchitis> Rx Zpak & Pred, now improved  just chr weak/ tired/ feeling bad/ mult somatic complaints...    BP now stable on diet alone & measures 122/70 today;  Chol looks good on Simva40;  GI symptoms stable on Nexium...    Fasting labs today all looked good> see below...  ~  June 26,2013:  22mo ROV & she reports that DrDeveshwar changed Neurontin to [facility] & this has helped her leg burning pain tremendously; unfortunatelt she still requires the Percocet per DrBartko 3 times daily due to her back;  She requests Rx for shingles vaccine because MaryJohnson told her you definitely don't want to get this!    HBP, borderline> not on meds; diet controlled- BP= 128/64 and she denies CP,  palpit, syncope, ch in SOB/DOE, edema, etc...    CHOL> on Simva40 w/ good control; prev FLP showed all parameters at goal & LDL 59...    LargeHH> on Nexium40 & stable w/o abd pain, n/v, dysphagia, etc...    Divertics/ IBS/ Colitis> treated 2011 for lymphocytic colitis w/ entocort & resolved...    GU> incont & bladder prob treated by DrTannenbaum; on Toviaz8 now but states she prev received a shock in her ankle but Medicare wouldn't pay...    DJD/ Back Pain, Chronic Pain> see above per DrDeveshwar & DrBartko> on Lyrica50Tid now, Percocet5 Q6H, Celebrex200, Flector, etc...    Osteoporosis> on FORTEO, Calcium, MVI, VitD; DrDeveshwar is in charge of this Rx...    Anemia, B12 Defic> on FeSO4 Bid w/ VitC and she repleted her B12 orally but has apparently stopped this once again... We reviewed prob list, meds, xrays and labs> see below>>   Problem List:  ALLERGY (ICD-995.3) - she uses ALLEGRA 180mg  daily and wants to continue w/ this therapy...  HYPERTENSION (ICD-401.9) - on ASA 81mg /d, off Lasix & controlled w/ diet alone, but she notes- "I love salt" (advised restriction), not checking BP's at home, etc...   ~  CXR 2/11 showed normal heart size, sl decr lung volumes but clear, DJD in spine...  HYPERCHOLESTEROLEMIA (ICD-272.0) - on SIMVASTATIN 40mg /d now... ~  FLP 4/08 showed TChol 218, TG 86, HDL 77, LDL 117 ~  FLP 2/09 showed TChol 198, TG 48, HDL 73, LDL 116 ~  3/10- she ret on diet alone- FLP w/ TChol 263, TG 65, HDL 81, LDL 158... rec> start Simva40. ~  9/10: needs FLP but not fasting today... LFTs are WNL. ~  3/11: reminded of need for f/u FLP on the Simva40. ~  FLP 6/11 on Simva40 showed TChol 189, TG 76, HDL 77, LDL 97... same Rx. ~  FLP 2/12 on Simva40 showed TChol 160, TG 61, HDL 66, LDL 82 ~  FLP 2/13 on Simva40 showed TChol 145, TG 31, HDL 80, LDL 59  HIATAL HERNIA (ICD-553.3) - on NEXIUM 40mg /d... EGD 11/03 by DrDBrodie showed 5cm HH, & esoph stricture dilated... developed  dysphagia  5/11 w/ repeat EGD by DrDBrodie> large HH, dilated & improved (may need HH surg)...   ~  CT Abd 2/11 showed large HH; bilat sm effusions & atx; DJD & post op changes in spine; redundant colon & few divertics; increased size of mult hypodense splenic masses & left adrenal mass ?etiology... ~  Ba swallow 2/11 showed large HH, presbyesoph... ~  She denies nausea, vomiting, heartburn, diarrhea, constipation, blood in stool, abdominal pain, swelling, gas...  DIVERTICULOSIS OF COLON (ICD-562.10) & IRRITABLE BOWEL SYNDROME (ICD-564.1) -  ~  last colonoscopy was 11/03 by DrDBrodie & showed divertics only... f/u 68yrs. ~  9/10:  she notes some constipation & Questran discontinued. ~  2/11:  FlexSig in hosp showed lymphocytic colitis- Rx'd Entocort & resolved...  S/P CHOLECYSTECTOMY for Stones 2/11 by DrHoxsworth...  UTIs & URINARY INCONTINENCE >> ~  prev evals by GYN DrMcPhail, & Urology DrTannenbaum; she has frequency, urge & stress incontinence, and a hypotonic bladder- s/p pubovag sling surg for incont... they tried Bethanachol (she stopped this med) & Gelnique (she states didn't help)... ~  Urology f/u by DrTannenbaum 12/10 reviewed & stable w/ phys therapy. ~  Subseq started TOVIAZ 8mg /d & improved, she says...  HYSTERECTOMY, HX OF (ICD-V45.77) - her last GYN was DrMcPhail before he retired...  DEGENERATIVE JOINT DISEASE (ICD-715.90) - on CELEBREX (uses it Prn)... followed by DrBeane/Olin for Ortho, DrDeveshwar for Rheum, & DrJones for Neurosurg... she has had right knee injections followed by right TKR 1/12 per DrBeane.  BACK PAIN, LUMBAR (ICD-724.2) - she has chronic kyphoscoliosis... DrBeane has evaluated and Rx w/ shots, Pred, pain meds... she has seen DrRamos & had selective L5 nerve root block (without benefit)... second opinion from DrDJones, Neurosurg w/ lumbar laminectomy 5/10 providing temp relief... Now followed by [facility] for PAIN MANAGEMENT... ~  9/10: c/o recurrent LBP w/  MRI per DrJones and epid shot planned per DrBartko... now on FLECTOR Prn, LYRICA 50mg Tid, PERCOCET up to 3 per day... ~  12/10: note from [facility] reviewed- pain management. ~  5/11:  seen by [facility] for another shot, Vicodin changed to OXYCODONE. ~  She has been seen by DrDeveshwar for Rheum & she rec continued pain management by DrBartko...  OSTEOPENIA (ICD-733.90) - she takes Ca++, MVI, Vit D; prev on Alendronte & DrDeveshwar changed her to [facility] 12/12...  ~  BMD 6/08 at [facility] & showed TScores from -2.0 to -2.7, sl better than in 2005... ~  Vit D level 2/09 was 30 (30-90) and rec to take Vit D 1000 u daily supplement... ~  BMD 10/12 at [facility] showed TScore at lowest measured site= -4.3 in left forearm... ~  12/12:  DrDeveshwar Rx w/ FORTEO starting 12/12... ~  Labs 2/13 showed Vit D level = 32  ANXIETY (ICD-300.00) - she declines anxiolytic Rx despite her anxiety...  ANEMIA OF CHRONIC DISEASE (ICD-285.29) -  taking FEOSOL 200mg  Bid w/ VitC now & added Vit B12 starting 2/12. ~  labs 2/09 showed Hg= 10.4 & MCV= 90... ~  labs 3/10 showed Hg= 12.9 ~  labs 9/10 showed Hg= 12.0, Fe= 89 ~  labs 3/11 showed Hg= 10.7, Fe= 55, TIBC= 119, Sat=35%. ~  labs 6/11 showed Hg= 11.8, Fe= 100 (37%sat). ~  Labs 2/12 showed Hg= 11.8, Fe= 20 (8%sat), Vit B12= 139 ~  Labs 2/13 showed Hg= 12.1, Fe= 109 (45%sat), B12= 1373  VITAMIN B-12 DEFICIENCY>  She was supposed to have started on oral Vit B12 supplements 2/12... ~  Labs 2/12 showed Vit B12 level = 139... rec to start oral B12 supplement ~1033mcg daily... ~  2013:  Noted that pt stopped the B12 supplement on her own...   Past Surgical History  Procedure Date  . Cataract surgery   . Abdominal hysterectomy   . Decompressive lumbar laminectomy 06/2008    at L2-3, L3-4 and L4-5 by Dr. Yetta Barre  . Laparoscopic cholecystectomy 04/2009    Dr. Odie Sera    Outpatient Encounter Prescriptions as of 08/25/2011  Medication Sig Dispense Refill  . aspirin 81  MG tablet Take 81 mg by mouth daily.        . calcium-vitamin D (  OSCAL WITH D) 500-200 MG-UNIT per tablet Take 1 tablet by mouth daily.        . celecoxib (CELEBREX) 200 MG capsule Take 1 capsule (200 mg total) by mouth daily. As needed for joint pain  90 capsule  3  . cephALEXin (KEFLEX) 500 MG capsule Take 4 tablets by mouth 1 hour prior to dental procedure  4 capsule  5  . Cholecalciferol (VITAMIN D) 1000 UNITS capsule Take 1,000 Units by mouth daily.        . diclofenac (FLECTOR) 1.3 % PTCH Apply as directed by Dr. Murray Hodgkins       . Ferrous Sulfate Dried (FEOSOL) 200 (65 FE) MG TABS Take 1 tablet by mouth 2 (two) times daily. Take with a vitamin c 500mg        . Fesoterodine Fumarate (TOVIAZ) 8 MG TB24 Take 8 mg by mouth daily. Per Dr. Patsi Sears       . fexofenadine (ALLEGRA) 180 MG tablet Take 180 mg by mouth daily. As needed for allergies        . FORTEO 600 MCG/2.4ML SOLN Daily.      [name] gabapentin (NEURONTIN) 300 MG capsule Take 300 mg by mouth at bedtime.       [name] NEXIUM 40 MG capsule TAKE 1 CAPSULE DAILY  90 capsule  2  . oxyCODONE-acetaminophen (PERCOCET) 5-325 MG per tablet Take 1 tablet by mouth every 6 (six) hours as needed.        . simvastatin (ZOCOR) 40 MG tablet TAKE 1 TABLET AT BEDTIME  90 tablet  2    Allergies  Allergen Reactions  . Amoxicillin-Pot Clavulanate     REACTION: diarrhea  . Sulfonamide Derivatives     REACTION: unsure of reaction    Current Medications, Allergies, Past Medical History, Past Surgical History, Family History, and Social History were reviewed in [employer] record.    Review of Systems        See HPI - all other systems neg except as noted...       The patient complains of decreased hearing, dyspnea on exertion, incontinence, arthritis all over & difficulty walking.  The patient denies anorexia, fever, weight loss, weight gain, vision loss, hoarseness, chest pain, syncope, peripheral edema, prolonged cough, headaches,  hemoptysis, abdominal pain, melena, hematochezia, severe indigestion/heartburn, hematuria, muscle weakness, suspicious skin lesions, transient blindness, depression, unusual weight change, abnormal bleeding, enlarged lymph nodes, and angioedema.     Objective:   Physical Exam     WD, WN, 78 y/o WF in NAD... GENERAL:  Alert & oriented; pleasant & cooperative... HEENT:  White/AT, EOM-full, PERRLA, EACs-clear, TMs-wnl, NOSE-clear, THROAT-clear & wnl. NECK:  Supple w/ fairROM; no JVD; normal carotid impulses w/o bruits; no thyromegaly or nodules palpated; no lymphadenopathy. CHEST:  Clear to P & A; without wheezes/ rales/ or rhonchi heard... HEART:  Regular Rhythm; without murmurs/ rubs/ or gallops detected... ABDOMEN:  Soft & sl tenderness, normal bowel sounds; no organomegaly or masses palpated...no guarding or rebound.  BACK:  s/p lumbar lam & scoliosis/ kyphosis EXT: without deformities, mod arthritic changes; no varicose veins/ +venous insuffic/ tr-1+ edema... NEURO:  CN's intact; no focal neuro deficits... DERM:  No lesions noted; no rash etc...  RADIOLOGY DATA:  Reviewed in the EPIC EMR & discussed w/ the patient...  LABORATORY DATA:  Reviewed in the EPIC EMR & discussed w/ the patient...   Assessment & Plan:    HBP>  Remains controlled on diet alone...  Ven Insuffic>  Reminded  to avoid sodium, elev legs, support hose..  CHOL>  On Simva40 + diet;  F/u FLP looked good...  GI>  HH/ Dysphagia/ Divertics/ IBS/ Lymphocytic colitis>  Followed by DrDBrodie & stable, continue same...  GU>  UTIs/ Incontinence>  Managed by DrTannenbaum on Toviaz8...  DJD/ LBP/ Chronic Pain Syndrome>  This is her CC & main prob; she was referred to Rheum DrDeveshwar; already seeing Dr Murray Hodgkins for Pain Management, DrOlin & DrBeane for Ortho;  See meds above & continue same> she notes that Lyrica helps leg burning discomfort but still need 3 Percocet/d from [facility] due to back pain...  Anxiety>  She declines  anxiolytic meds...  ANEMIA>  Fe & B12 deficient>  On oral supplements her CBC, Fe, B12 are all better...   Patient's Medications  New Prescriptions   No medications on file  Previous Medications   ASPIRIN 81 MG TABLET    Take 81 mg by mouth daily.     CALCIUM-VITAMIN D (OSCAL WITH D) 500-200 MG-UNIT PER TABLET    Take 1 tablet by mouth daily.     CELECOXIB (CELEBREX) 200 MG CAPSULE    Take 1 capsule (200 mg total) by mouth daily. As needed for joint pain   CEPHALEXIN (KEFLEX) 500 MG CAPSULE    Take 4 tablets by mouth 1 hour prior to dental procedure   CHOLECALCIFEROL (VITAMIN D) 1000 UNITS CAPSULE    Take 1,000 Units by mouth daily.     DICLOFENAC (FLECTOR) 1.3 % PTCH    Apply as directed by Dr. Ardelle Balls SULFATE DRIED (FEOSOL) 200 (65 FE) MG TABS    Take 1 tablet by mouth 2 (two) times daily. Take with a vitamin c 500mg     FESOTERODINE FUMARATE (TOVIAZ) 8 MG TB24    Take 8 mg by mouth daily. Per Dr. Patsi Sears    FEXOFENADINE (ALLEGRA) 180 MG TABLET    Take 180 mg by mouth daily. As needed for allergies     FORTEO 600 MCG/2.4ML SOLN    Daily.   NEXIUM 40 MG CAPSULE    TAKE 1 CAPSULE DAILY   OXYCODONE-ACETAMINOPHEN (PERCOCET) 5-325 MG PER TABLET    Take 1 tablet by mouth every 6 (six) hours as needed.     SIMVASTATIN (ZOCOR) 40 MG TABLET    TAKE 1 TABLET AT BEDTIME  Modified Medications   Modified Medication Previous Medication   PREGABALIN (LYRICA) 50 MG CAPSULE pregabalin (LYRICA) 50 MG capsule      Take 1 capsule (50 mg total) by mouth 3 (three) times daily.    Take 50 mg by mouth 3 (three) times daily.  Discontinued Medications   GABAPENTIN (NEURONTIN) 300 MG CAPSULE    Take 300 mg by mouth at bedtime.

## 2011-09-05 ENCOUNTER — Ambulatory Visit (INDEPENDENT_AMBULATORY_CARE_PROVIDER_SITE_OTHER): Payer: Medicare Other | Admitting: Family Medicine

## 2011-09-05 ENCOUNTER — Encounter (HOSPITAL_COMMUNITY): Payer: Self-pay | Admitting: *Deleted

## 2011-09-05 ENCOUNTER — Ambulatory Visit: Payer: Medicare Other

## 2011-09-05 ENCOUNTER — Inpatient Hospital Stay (HOSPITAL_COMMUNITY)
Admission: EM | Admit: 2011-09-05 | Discharge: 2011-09-09 | DRG: 195 | Disposition: A | Discharge status: Home / Self Care | Payer: Medicare Other | Source: Ambulatory Visit | Attending: Internal Medicine | Admitting: Internal Medicine

## 2011-09-05 VITALS — BP 134/78 | HR 82 | Temp 103.0°F | Resp 18 | Ht 60.0 in | Wt 128.0 lb

## 2011-09-05 DIAGNOSIS — R0902 Hypoxemia: Secondary | ICD-10-CM

## 2011-09-05 DIAGNOSIS — J189 Pneumonia, unspecified organism: Principal | ICD-10-CM

## 2011-09-05 DIAGNOSIS — R0602 Shortness of breath: Secondary | ICD-10-CM

## 2011-09-05 DIAGNOSIS — K5730 Diverticulosis of large intestine without perforation or abscess without bleeding: Secondary | ICD-10-CM

## 2011-09-05 DIAGNOSIS — K449 Diaphragmatic hernia without obstruction or gangrene: Secondary | ICD-10-CM | POA: Diagnosis present

## 2011-09-05 DIAGNOSIS — T7840XA Allergy, unspecified, initial encounter: Secondary | ICD-10-CM

## 2011-09-05 DIAGNOSIS — R509 Fever, unspecified: Secondary | ICD-10-CM

## 2011-09-05 DIAGNOSIS — E538 Deficiency of other specified B group vitamins: Secondary | ICD-10-CM

## 2011-09-05 DIAGNOSIS — R32 Unspecified urinary incontinence: Secondary | ICD-10-CM

## 2011-09-05 DIAGNOSIS — M47817 Spondylosis without myelopathy or radiculopathy, lumbosacral region: Secondary | ICD-10-CM | POA: Diagnosis present

## 2011-09-05 DIAGNOSIS — Z87891 Personal history of nicotine dependence: Secondary | ICD-10-CM

## 2011-09-05 DIAGNOSIS — R1319 Other dysphagia: Secondary | ICD-10-CM

## 2011-09-05 DIAGNOSIS — M1990 Unspecified osteoarthritis, unspecified site: Secondary | ICD-10-CM | POA: Diagnosis present

## 2011-09-05 DIAGNOSIS — D649 Anemia, unspecified: Secondary | ICD-10-CM

## 2011-09-05 DIAGNOSIS — M899 Disorder of bone, unspecified: Secondary | ICD-10-CM

## 2011-09-05 DIAGNOSIS — E7800 Pure hypercholesterolemia, unspecified: Secondary | ICD-10-CM

## 2011-09-05 DIAGNOSIS — M545 Low back pain, unspecified: Secondary | ICD-10-CM

## 2011-09-05 DIAGNOSIS — N390 Urinary tract infection, site not specified: Secondary | ICD-10-CM

## 2011-09-05 DIAGNOSIS — A0472 Enterocolitis due to Clostridium difficile, not specified as recurrent: Secondary | ICD-10-CM

## 2011-09-05 DIAGNOSIS — J209 Acute bronchitis, unspecified: Secondary | ICD-10-CM

## 2011-09-05 DIAGNOSIS — Z66 Do not resuscitate: Secondary | ICD-10-CM | POA: Diagnosis present

## 2011-09-05 DIAGNOSIS — Z7982 Long term (current) use of aspirin: Secondary | ICD-10-CM

## 2011-09-05 DIAGNOSIS — M810 Age-related osteoporosis without current pathological fracture: Secondary | ICD-10-CM | POA: Diagnosis present

## 2011-09-05 DIAGNOSIS — I10 Essential (primary) hypertension: Secondary | ICD-10-CM

## 2011-09-05 DIAGNOSIS — K219 Gastro-esophageal reflux disease without esophagitis: Secondary | ICD-10-CM | POA: Diagnosis present

## 2011-09-05 DIAGNOSIS — F411 Generalized anxiety disorder: Secondary | ICD-10-CM

## 2011-09-05 DIAGNOSIS — D72829 Elevated white blood cell count, unspecified: Secondary | ICD-10-CM

## 2011-09-05 DIAGNOSIS — I872 Venous insufficiency (chronic) (peripheral): Secondary | ICD-10-CM

## 2011-09-05 DIAGNOSIS — D509 Iron deficiency anemia, unspecified: Secondary | ICD-10-CM

## 2011-09-05 DIAGNOSIS — E785 Hyperlipidemia, unspecified: Secondary | ICD-10-CM | POA: Diagnosis present

## 2011-09-05 DIAGNOSIS — K589 Irritable bowel syndrome without diarrhea: Secondary | ICD-10-CM | POA: Diagnosis present

## 2011-09-05 LAB — BASIC METABOLIC PANEL
BUN: 15 mg/dL (ref 6–23)
CO2: 28 mEq/L (ref 19–32)
Chloride: 102 mEq/L (ref 96–112)
Creatinine, Ser: 0.79 mg/dL (ref 0.50–1.10)
Glucose, Bld: 97 mg/dL (ref 70–99)

## 2011-09-05 LAB — POCT CBC
Granulocyte percent: 84.9 %G — AB (ref 37–80)
HCT, POC: 36.6 % — AB (ref 37.7–47.9)
Hemoglobin: 11.1 g/dL — AB (ref 12.2–16.2)
Lymph, poc: 1.3 (ref 0.6–3.4)
MCH, POC: 29.2 pg (ref 27–31.2)
MCHC: 30.3 g/dL — AB (ref 31.8–35.4)
MCV: 96.4 fL (ref 80–97)
MID (cbc): 1 — AB (ref 0–0.9)
MPV: 9.6 fL (ref 0–99.8)
POC Granulocyte: 12.7 — AB (ref 2–6.9)
POC LYMPH PERCENT: 8.6 % — AB (ref 10–50)
POC MID %: 6.5 %M (ref 0–12)
Platelet Count, POC: 159 10*3/uL (ref 142–424)
RBC: 3.8 M/uL — AB (ref 4.04–5.48)
RDW, POC: 14 %
WBC: 15 10*3/uL — AB (ref 4.6–10.2)

## 2011-09-05 LAB — CBC
HCT: 34.9 % — ABNORMAL LOW (ref 36.0–46.0)
MCV: 93.8 fL (ref 78.0–100.0)
RBC: 3.72 MIL/uL — ABNORMAL LOW (ref 3.87–5.11)
RDW: 13.2 % (ref 11.5–15.5)
WBC: 17.2 10*3/uL — ABNORMAL HIGH (ref 4.0–10.5)

## 2011-09-05 LAB — STREP PNEUMONIAE URINARY ANTIGEN: Strep Pneumo Urinary Antigen: NEGATIVE

## 2011-09-05 MED ORDER — LORATADINE 10 MG PO TABS
10.0000 mg | ORAL_TABLET | Freq: Every day | ORAL | Status: DC
  Administered 2011-09-05 – 2011-09-09 (×5): 10 mg via ORAL
  Filled 2011-09-05 (×5): qty 1

## 2011-09-05 MED ORDER — SIMVASTATIN 40 MG PO TABS
40.0000 mg | ORAL_TABLET | Freq: Every day | ORAL | Status: DC
  Administered 2011-09-05 – 2011-09-08 (×4): 40 mg via ORAL
  Filled 2011-09-05 (×5): qty 1

## 2011-09-05 MED ORDER — ACETAMINOPHEN 650 MG RE SUPP: 650.0000 mg | Freq: Four times a day (QID) | RECTAL | Status: DC | PRN

## 2011-09-05 MED ORDER — FESOTERODINE FUMARATE ER 8 MG PO TB24
8.0000 mg | ORAL_TABLET | Freq: Every day | ORAL | Status: DC
  Administered 2011-09-05 – 2011-09-09 (×5): 8 mg via ORAL
  Filled 2011-09-05 (×8): qty 1

## 2011-09-05 MED ORDER — ONDANSETRON HCL 4 MG PO TABS: 4.0000 mg | ORAL_TABLET | Freq: Four times a day (QID) | ORAL | Status: DC | PRN

## 2011-09-05 MED ORDER — DEXTROSE 5 % IV SOLN
1.0000 g | INTRAVENOUS | Status: DC
  Administered 2011-09-06 – 2011-09-07 (×2): 1 g via INTRAVENOUS
  Filled 2011-09-05 (×3): qty 10

## 2011-09-05 MED ORDER — DEXTROSE 5 % IV SOLN
500.0000 mg | INTRAVENOUS | Status: DC
  Administered 2011-09-06 – 2011-09-07 (×2): 500 mg via INTRAVENOUS
  Filled 2011-09-05 (×3): qty 500

## 2011-09-05 MED ORDER — ASPIRIN 81 MG PO CHEW
81.0000 mg | CHEWABLE_TABLET | Freq: Every day | ORAL | Status: DC
  Administered 2011-09-05 – 2011-09-09 (×5): 81 mg via ORAL
  Filled 2011-09-05 (×7): qty 1

## 2011-09-05 MED ORDER — OXYCODONE-ACETAMINOPHEN 5-325 MG PO TABS: 1.0000 | ORAL_TABLET | Freq: Four times a day (QID) | ORAL | Status: DC | PRN

## 2011-09-05 MED ORDER — ACETAMINOPHEN 325 MG PO TABS
650.0000 mg | ORAL_TABLET | Freq: Four times a day (QID) | ORAL | Status: DC | PRN
  Administered 2011-09-06 – 2011-09-08 (×5): 650 mg via ORAL
  Filled 2011-09-05 (×5): qty 2

## 2011-09-05 MED ORDER — FERROUS SULFATE 325 (65 FE) MG PO TABS
325.0000 mg | ORAL_TABLET | Freq: Two times a day (BID) | ORAL | Status: DC
  Administered 2011-09-06 – 2011-09-09 (×7): 325 mg via ORAL
  Filled 2011-09-05 (×9): qty 1

## 2011-09-05 MED ORDER — DEXTROSE 5 % IV SOLN
500.0000 mg | Freq: Once | INTRAVENOUS | Status: DC
  Administered 2011-09-05: 500 mg via INTRAVENOUS
  Filled 2011-09-05: qty 500

## 2011-09-05 MED ORDER — CEFTRIAXONE SODIUM 1 G IJ SOLR
1.0000 g | Freq: Once | INTRAMUSCULAR | Status: AC
  Administered 2011-09-05: 1 g via INTRAVENOUS
  Filled 2011-09-05: qty 10

## 2011-09-05 MED ORDER — ONDANSETRON HCL 4 MG/2ML IJ SOLN: 4.0000 mg | Freq: Four times a day (QID) | INTRAMUSCULAR | Status: DC | PRN

## 2011-09-05 MED ORDER — PNEUMOCOCCAL VAC POLYVALENT 25 MCG/0.5ML IJ INJ
0.5000 mL | INJECTION | INTRAMUSCULAR | Status: AC
  Administered 2011-09-06: 0.5 mL via INTRAMUSCULAR
  Filled 2011-09-05: qty 0.5

## 2011-09-05 MED ORDER — SODIUM CHLORIDE 0.9 % IV SOLN
INTRAVENOUS | Status: DC
  Administered 2011-09-05 – 2011-09-07 (×4): via INTRAVENOUS

## 2011-09-05 MED ORDER — PANTOPRAZOLE SODIUM 40 MG PO TBEC
40.0000 mg | DELAYED_RELEASE_TABLET | Freq: Every day | ORAL | Status: DC
  Administered 2011-09-05 – 2011-09-09 (×5): 40 mg via ORAL
  Filled 2011-09-05 (×5): qty 1

## 2011-09-05 MED ORDER — PREGABALIN 50 MG PO CAPS
50.0000 mg | ORAL_CAPSULE | Freq: Three times a day (TID) | ORAL | Status: DC
  Administered 2011-09-05 – 2011-09-09 (×11): 50 mg via ORAL
  Filled 2011-09-05 (×11): qty 1

## 2011-09-05 MED ORDER — SODIUM CHLORIDE 0.9 % IV BOLUS (SEPSIS)
500.0000 mL | Freq: Once | INTRAVENOUS | Status: AC
  Administered 2011-09-05: 500 mL via INTRAVENOUS

## 2011-09-05 MED ORDER — ASPIRIN 81 MG PO TABS: 81.0000 mg | ORAL_TABLET | Freq: Every day | ORAL | Status: DC

## 2011-09-05 MED ORDER — FERROUS SULFATE DRIED 200 (65 FE) MG PO TABS: 1.0000 | ORAL_TABLET | Freq: Two times a day (BID) | ORAL | Status: DC

## 2011-09-05 MED ORDER — ENOXAPARIN SODIUM 40 MG/0.4ML ~~LOC~~ SOLN
40.0000 mg | SUBCUTANEOUS | Status: DC
  Administered 2011-09-05 – 2011-09-07 (×3): 40 mg via SUBCUTANEOUS
  Filled 2011-09-05 (×5): qty 0.4

## 2011-09-05 MED ORDER — ACETAMINOPHEN 325 MG PO TABS
650.0000 mg | ORAL_TABLET | Freq: Once | ORAL | Status: AC
  Administered 2011-09-05: 650 mg via ORAL
  Filled 2011-09-05: qty 2

## 2011-09-05 NOTE — ED Notes (Signed)
All extra sheets removed from patient

## 2011-09-05 NOTE — ED Notes (Signed)
Pt is tx from urgent care for further work up on low oxygenation saturation and possible PNA. Staff noted pts O2 sat to be in the 80s. EMS placed pt on 3L of O2 and sats above 93%. Temperature 103 at urgent care.

## 2011-09-05 NOTE — ED Provider Notes (Signed)
I saw and evaluated the patient, reviewed the resident's note and I agree with the findings and plan. I saw and evaluated the patient's EKG. I agree with the resident's interpretation. Patient presented from urgent care after having symptoms of not feeling well. At urgent care she was found to have a pulse ox of 70 and a temperature of 102. Patient has abnormal breath sounds on the left and a chest x-ray showing left-sided pneumonia patient was treated for community-acquired pneumonia and admitted  Gwyneth Sprout, MD 09/05/11 2326

## 2011-09-05 NOTE — Progress Notes (Signed)
[facility]:  Office Visit  Chief Complaint:  Chief Complaint  Patient presents with  . Generalized Body Aches    1 day   . Sinusitis    1 day  . Cough    1 day     HPI: Cathy [name] is a 78 y.o. female who complains of :  1 day history of sinusitis sxs, cough, generalized aches/pains. Has not tried anything. + fever, chills. Lives alone. Former smoker. 20 years. "Non-inhaler" No history of oxygen dependence. No treatment attempted. NO recent hospitalizations. No recent illneses/abx use.   Past Medical History  Diagnosis Date  . Allergy history unknown   . Hypertension   . Venous insufficiency   . Hypercholesteremia   . Hiatal hernia   . Dysphagia   . Diverticulosis of colon   . Irritable bowel syndrome   . Colitis     lymphocytic colitis feb 2011  . UTI (lower urinary tract infection)   . Urinary incontinence   . DJD (degenerative joint disease)   . Lumbar back pain   . Osteopenia   . Anxiety   . Anemia   . Iron deficiency   . Vitamin B12 deficiency    Past Surgical History  Procedure Date  . Cataract surgery   . Abdominal hysterectomy   . Decompressive lumbar laminectomy 06/2008    at L2-3, L3-4 and L4-5 by Dr. Yetta Barre  . Laparoscopic cholecystectomy 04/2009    Dr. Odie Sera   History   Social History  . Marital Status: Widowed    Spouse Name: N/A    Number of Children: N/A  . Years of Education: N/A   Occupational History  . retired    Social History Main Topics  . Smoking status: Former Smoker -- 0.5 packs/day for 10 years    Types: Cigarettes  . Smokeless tobacco: Never Used  . Alcohol Use: No  . Drug Use: No  . Sexually Active: None   Other Topics Concern  . None   Social History Narrative  . None   No family history on file. Allergies  Allergen Reactions  . Amoxicillin-Pot Clavulanate     REACTION: diarrhea  . Sulfonamide Derivatives     REACTION: unsure of reaction   Prior to Admission medications   Medication  Sig Start Date End Date Taking? Authorizing Provider  aspirin 81 MG tablet Take 81 mg by mouth daily.     Yes Historical Provider, MD  calcium-vitamin D (OSCAL WITH D) 500-200 MG-UNIT per tablet Take 1 tablet by mouth daily.     Yes Historical Provider, MD  celecoxib (CELEBREX) 200 MG capsule Take 1 capsule (200 mg total) by mouth daily. As needed for joint pain 10/14/10  Yes Michele Mcalpine, MD  cephALEXin [facility]) 500 MG capsule Take 4 tablets by mouth 1 hour prior to dental procedure 04/28/11  Yes Michele Mcalpine, MD  Cholecalciferol (VITAMIN D) 1000 UNITS capsule Take 1,000 Units by mouth daily.     Yes Historical Provider, MD  diclofenac (FLECTOR) 1.3 % PTCH Apply as directed by Dr. Murray Hodgkins    Yes Historical Provider, MD  Ferrous Sulfate Dried (FEOSOL) 200 (65 FE) MG TABS Take 1 tablet by mouth 2 (two) times daily. Take with a vitamin c 500mg     Yes Historical Provider, MD  Fesoterodine Fumarate (TOVIAZ) 8 MG TB24 Take 8 mg by mouth daily. Per Dr. Patsi Sears    Yes Historical Provider, MD  fexofenadine (ALLEGRA) 180 MG tablet Take  180 mg by mouth daily. As needed for allergies     Yes Historical Provider, MD  FORTEO 600 MCG/2.4ML SOLN Daily. 03/09/11  Yes Historical Provider, MD  NEXIUM 40 MG capsule TAKE 1 CAPSULE DAILY 04/15/11  Yes Michele Mcalpine, MD  oxyCODONE-acetaminophen (PERCOCET) 5-325 MG per tablet Take 1 tablet by mouth every 6 (six) hours as needed.     Yes Historical Provider, MD  pregabalin (LYRICA) 50 MG capsule Take 1 capsule (50 mg total) by mouth 3 (three) times daily. 08/25/11  Yes Michele Mcalpine, MD  simvastatin (ZOCOR) 40 MG tablet TAKE 1 TABLET AT BEDTIME 04/15/11  Yes Michele Mcalpine, MD     ROS: The patient denies fevers, chills, night sweats, unintentional weight loss, chest pain, palpitations, wheezing, dyspnea on exertion, nausea, vomiting, abdominal pain, dysuria, hematuria, melena, numbness, weakness, or tingling.+ SOB, cough, fever, chills  All other systems have been reviewed  and were otherwise negative with the exception of those mentioned in the HPI and as above.    PHYSICAL EXAM: Filed Vitals:   09/05/11 1437  BP: 134/78  Pulse: 82  Temp: 103 F (39.4 C)  Resp: 18  SPo2    78% Filed Vitals:   09/05/11 1437  Height: 5' (1.524 m)  Weight: 128 lb (58.06 kg)   Body mass index is 25.00 kg/(m^2).  General: Alert, no acute distress HEENT:  Normocephalic, atraumatic, oropharynx patent.  Cardiovascular:  Regular rate and rhythm, no rubs murmurs or gallops.  No Carotid bruits, radial pulse intact. No pedal edema.  Respiratory: No wheezes or rhonchi.  No cyanosis. + rales and decrease BS on left lower lung field.  GI: No organomegaly, abdomen is soft and non-tender, positive bowel sounds.  No masses. Skin: No rashes. Neurologic: Facial musculature symmetric. Psychiatric: Patient is appropriate throughout our interaction. Lymphatic: No cervical lymphadenopathy Musculoskeletal: Gait intact for age   LABS: Results for orders placed in visit on 09/05/11  POCT CBC      Component Value Range   WBC 15.0 (*) 4.6 - 10.2 K/uL   Lymph, poc 1.3  0.6 - 3.4   POC LYMPH PERCENT 8.6 (*) 10 - 50 %L   MID (cbc) 1.0 (*) 0 - 0.9   POC MID % 6.5  0 - 12 %M   POC Granulocyte 12.7 (*) 2 - 6.9   Granulocyte percent 84.9 (*) 37 - 80 %G   RBC 3.80 (*) 4.04 - 5.48 M/uL   Hemoglobin 11.1 (*) 12.2 - 16.2 g/dL   HCT, POC 78.2 (*) 95.6 - 47.9 %   MCV 96.4  80 - 97 fL   MCH, POC 29.2  27 - 31.2 pg   MCHC 30.3 (*) 31.8 - 35.4 g/dL   RDW, POC 21.3     Platelet Count, POC 159  142 - 424 K/uL   MPV 9.6  0 - 99.8 fL     EKG/XRAY:   Primary read interpreted by Dr. Conley Rolls at [facility]. + left lung infiltrate, minimal effusion   ASSESSMENT/PLAN: Encounter Diagnoses  Name Primary?  . Fever Yes  . SOB (shortness of breath)   . Pneumonia    78 y/o female with LLL PNA with effusion. Came into office with 78% pulse ox, went as high as 82-87% after multiple readings. Was able to get up  to 92% on 2 L. Patient is non O2  dependent at home. WBC is 15. Temp is 103 F. Patient has been febrile and SOB at home.  Patient sent to [facility] ER, phoned charged nurse about patient.Will arrive by ambulance.    Hamilton Capri PHUONG, DO 09/05/2011 3:26 PM

## 2011-09-05 NOTE — ED Notes (Signed)
Radiology aware xray canceled

## 2011-09-05 NOTE — ED Notes (Signed)
Admitting at bedside 

## 2011-09-05 NOTE — ED Provider Notes (Signed)
History     CSN: [id number]  Arrival date & time 09/05/11  1600   First MD Initiated Contact with Patient 09/05/11 1607      Chief Complaint  Patient presents with  . Pneumonia  . Fever  . Cough    (Consider location/radiation/quality/duration/timing/severity/associated sxs/prior treatment) Patient is a 78 y.o. female presenting with fever. The history is provided by the patient and medical records.  Fever Primary symptoms of the febrile illness include fever, fatigue, cough, myalgias and arthralgias. Primary symptoms do not include headaches, shortness of breath, abdominal pain, nausea, vomiting, diarrhea, dysuria or rash. The current episode started 3 to 5 days ago. This is a new problem. The problem has not changed since onset. The maximum temperature recorded prior to her arrival was unknown (subjective).  The myalgias are generalized. The myalgias are aching.  Location: generalized. They are aching in nature.    Past Medical History  Diagnosis Date  . Allergy history unknown   . Hypertension   . Venous insufficiency   . Hypercholesteremia   . Hiatal hernia   . Dysphagia   . Diverticulosis of colon   . Irritable bowel syndrome   . Colitis     lymphocytic colitis feb 2011  . UTI (lower urinary tract infection)   . Urinary incontinence   . DJD (degenerative joint disease)   . Lumbar back pain   . Osteopenia   . Anxiety   . Anemia   . Iron deficiency   . Vitamin B12 deficiency     Past Surgical History  Procedure Date  . Cataract surgery   . Abdominal hysterectomy   . Decompressive lumbar laminectomy 06/2008    at L2-3, L3-4 and L4-5 by Dr. Yetta Barre  . Laparoscopic cholecystectomy 04/2009    Dr. Odie Sera    History reviewed. No pertinent family history.  History  Substance Use Topics  . Smoking status: Former Smoker -- 0.5 packs/day for 10 years    Types: Cigarettes  . Smokeless tobacco: Never Used  . Alcohol Use: No    OB History    Grav Para Term  Preterm Abortions TAB SAB Ect Mult Living                  Review of Systems  Constitutional: Positive for fever and fatigue. Negative for chills.  HENT: Negative for congestion, sore throat, rhinorrhea, neck pain and neck stiffness.   Respiratory: Positive for cough. Negative for chest tightness and shortness of breath.   Cardiovascular: Negative for chest pain, palpitations and leg swelling.  Gastrointestinal: Negative for nausea, vomiting, abdominal pain and diarrhea.  Genitourinary: Negative for dysuria.  Musculoskeletal: Positive for myalgias and arthralgias. Negative for back pain.  Skin: Negative for color change, rash and wound.  Neurological: Negative for light-headedness and headaches.  All other systems reviewed and are negative.    Allergies  Amoxicillin-pot clavulanate and Sulfonamide derivatives  Home Medications   Current Outpatient Rx  Name Route Sig Dispense Refill  . ESOMEPRAZOLE MAGNESIUM 40 MG PO CPDR Oral Take 40 mg by mouth daily before breakfast.    . SIMVASTATIN 40 MG PO TABS Oral Take 40 mg by mouth at bedtime.    . ASPIRIN 81 MG PO TABS Oral Take 81 mg by mouth daily.      [name] CALCIUM CARBONATE-VITAMIN D 500-200 MG-UNIT PO TABS Oral Take 1 tablet by mouth daily.     . CELECOXIB 200 MG PO CAPS Oral Take 200 mg by mouth daily as needed.  As needed for joint pain    . CEPHALEXIN 500 MG PO CAPS Oral Take 2,000 mg by mouth once. Take 1 hour prior to dental procedure    . VITAMIN D 1000 UNITS PO CAPS Oral Take 1,000 Units by mouth daily.      [name] DICLOFENAC EPOLAMINE 1.3 % TD PTCH  Apply as directed by Dr. Murray Hodgkins     . FERROUS SULFATE DRIED 200 (65 FE) MG PO TABS Oral Take 1 tablet by mouth 2 (two) times daily. Take with a vitamin c 500mg      . FESOTERODINE FUMARATE ER 8 MG PO TB24 Oral Take 8 mg by mouth daily. Per Dr. Patsi Sears     . FEXOFENADINE HCL 180 MG PO TABS Oral Take 180 mg by mouth daily. As needed for allergies      . FORTEO 600 MCG/2.4ML [place] SOLN   Daily.    . OXYCODONE-ACETAMINOPHEN 5-325 MG PO TABS Oral Take 1 tablet by mouth every 6 (six) hours as needed.     [name] PREGABALIN 50 MG PO CAPS Oral Take 1 capsule (50 mg total) by mouth 3 (three) times daily. 270 capsule 3    BP 148/70  Pulse 75  Temp 101.8 F (38.8 C) (Oral)  Resp 22  SpO2 91%  Physical Exam  Nursing note and vitals reviewed. Constitutional: She is oriented to person, place, and time. She appears well-developed and well-nourished.  HENT:  Head: Normocephalic and atraumatic.  Mouth/Throat: Oropharynx is clear and moist.  Eyes: Pupils are equal, round, and reactive to light.  Cardiovascular: Normal rate, regular rhythm, normal heart sounds and intact distal pulses.   Pulmonary/Chest: Effort normal. No respiratory distress. She has rhonchi (L side).  Abdominal: Soft. She exhibits no distension. There is no tenderness.  Lymphadenopathy:    She has no cervical adenopathy.  Neurological: She is alert and oriented to person, place, and time.  Skin: Skin is warm and dry. No rash noted.  Psychiatric: She has a normal mood and affect.    ED Course  Procedures (including critical care time)  Labs Reviewed  CBC - Abnormal; Notable for the following:    WBC 17.2 (*)     RBC 3.72 (*)     Hemoglobin 11.3 (*)     HCT 34.9 (*)     All other components within normal limits  BASIC METABOLIC PANEL - Abnormal; Notable for the following:    GFR calc non Af Amer 76 (*)     GFR calc Af Amer 88 (*)     All other components within normal limits  CULTURE, BLOOD (ROUTINE X 2)  CULTURE, BLOOD (ROUTINE X 2)  LEGIONELLA ANTIGEN, URINE  STREP PNEUMONIAE URINARY ANTIGEN  CULTURE, EXPECTORATED SPUTUM-ASSESSMENT  CBC  CREATININE, SERUM  CBC  BASIC METABOLIC PANEL   Dg Chest 2 View  09/05/2011  *RADIOLOGY REPORT*  Clinical Data: Chest congestion and fatigue.  Leukocytosis. Shortness of breath.  CHEST - 2 VIEW  Comparison: None.  Findings: Patchy lingular and left lower lobe  airspace opacity with obscuration of the left heart borders.  Probable small left pleural effusion.  Clear right lung.  Grossly normal sized heart.  Moderate to large sized hiatal hernia.  Bilateral shoulder degenerative changes.  Thoracolumbar scoliosis and degenerative changes. Cholecystectomy clips.  IMPRESSION:  1.  Lingular and left lower lobe pneumonia. 2.  Probable small left pleural effusion. 3.  Moderate to large sized hiatal hernia.  Clinically significant discrepancy from primary report, if provided: None  Original Report Authenticated By: Darrol Angel, M.D.     Date: 09/05/2011  Rate: 72  Rhythm: normal sinus rhythm  QRS Axis: normal  Intervals: normal  ST/T Wave abnormalities: nonspecific T wave changes  Conduction Disutrbances:left anterior fascicular block  Narrative Interpretation:   Old EKG Reviewed: unchanged 04/18/09     1. Community acquired pneumonia   2. Hypoxia   3. Fever       MDM  This is an 78 year old female who presents today for urgent care center with pneumonia. The patient states that for the past several days she has high subjective fevers generalized myalgias and arthralgias, as well as mild generalized fatigue. She states that she went to urgent care center and was sent over here for further evaluation. At urgent care she was found to have a temperature of 103, with O2 sats on room air of 78%. The patient denies any recent shortness of breath or trouble breathing, though does endorse a recent nonproductive cough. She is in no acute distress at this point in time, and is satting in the mid 90s on 3 L nasal cannula. The patient denies any other infectious symptoms. On exam she does have some rhonchi in the left lower lung, but exam is otherwise unremarkable. Chest x-ray at urgent care shows a lingular and left lower lobe pneumonia. The patient meets criteria for community acquired pneumonia, so will be treated with Rocephin and azithromycin. Blood cultures  were drawn and antibiotics. Due to her new onset hypoxia, the patient will be admitted for further treatment.      Theotis Burrow, MD 09/05/11 2004  Theotis Burrow, MD 09/05/11 2004

## 2011-09-05 NOTE — H&P (Addendum)
PCP:   Michele Mcalpine, MD   Chief Complaint:  Generalized weakness  HPI: Cathy [name] is a 83/F history of Hypertension, dyslipidemia, hiatal hernia, IBS, DJD, osteoporosis was brought to the ER by her family from urgent care. Patient reports onset of cold/sinusitis like symptoms followed by cough and chest congestion over the last week. This progressively worsened over the last 3 days associated with generalized body aches and pains, she tried some Mucinex at home without much benefit. Friday her family noted that she was very weak, patient also reported subjective fevers, chills , finally brought to urgent care by her daughter and she was noted to have a temperature of 103 and O2 sats of 77% on room air. Subsequently then had a chest x-ray done which showed left lower lobe pneumonia   Allergies:   Allergies  Allergen Reactions  . Amoxicillin-Pot Clavulanate     REACTION: diarrhea  . Sulfonamide Derivatives     REACTION: unsure of reaction      Past Medical History  Diagnosis Date  . Allergy history unknown   . Hypertension   . Venous insufficiency   . Hypercholesteremia   . Hiatal hernia   . Dysphagia   . Diverticulosis of colon   . Irritable bowel syndrome   . Colitis     lymphocytic colitis feb 2011  . UTI (lower urinary tract infection)   . Urinary incontinence   . DJD (degenerative joint disease)   . Lumbar back pain   . Osteopenia   . Anxiety   . Anemia   . Iron deficiency   . Vitamin B12 deficiency     Past Surgical History  Procedure Date  . Cataract surgery   . Abdominal hysterectomy   . Decompressive lumbar laminectomy 06/2008    at L2-3, L3-4 and L4-5 by Dr. Yetta Barre  . Laparoscopic cholecystectomy 04/2009    Dr. Odie Sera    Prior to Admission medications   Medication Sig Start Date End Date Taking? Authorizing Provider  aspirin 81 MG tablet Take 81 mg by mouth daily.     Yes Historical Provider, MD  calcium-vitamin D (OSCAL WITH D) 500-200 MG-UNIT per  tablet Take 1 tablet by mouth daily.    Yes Historical Provider, MD  celecoxib (CELEBREX) 200 MG capsule Take 200 mg by mouth daily as needed. As needed for joint pain 10/14/10  Yes Michele Mcalpine, MD  Cholecalciferol (VITAMIN D) 1000 UNITS capsule Take 1,000 Units by mouth daily.     Yes Historical Provider, MD  diclofenac (FLECTOR) 1.3 % PTCH Place 1 patch onto the skin every 12 (twelve) hours. Apply as directed by Dr. Murray Hodgkins   Yes Historical Provider, MD  esomeprazole (NEXIUM) 40 MG capsule Take 40 mg by mouth daily before breakfast.   Yes Historical Provider, MD  Ferrous Sulfate Dried (FEOSOL) 200 (65 FE) MG TABS Take 1 tablet by mouth 2 (two) times daily. Take with a vitamin c 500mg     Yes Historical Provider, MD  Fesoterodine Fumarate (TOVIAZ) 8 MG TB24 Take 8 mg by mouth daily. Per Dr. Patsi Sears    Yes Historical Provider, MD  fexofenadine (ALLEGRA) 180 MG tablet Take 180 mg by mouth daily. As needed for allergies     Yes Historical Provider, MD  FORTEO 600 MCG/2.4ML SOLN Inject 1 mL into the muscle Daily.  03/09/11  Yes Historical Provider, MD  oxyCODONE-acetaminophen (PERCOCET) 5-325 MG per tablet Take 1 tablet by mouth every 6 (six) hours as needed.    Yes Historical  Provider, MD  pregabalin (LYRICA) 50 MG capsule Take 50 mg by mouth 3 (three) times daily. 08/25/11  Yes Michele Mcalpine, MD  simvastatin (ZOCOR) 40 MG tablet Take 40 mg by mouth at bedtime.   Yes Historical Provider, MD    Social History:  reports that she has quit smoking. Her smoking use included Cigarettes. She has a 5 pack-year smoking history. She has never used smokeless tobacco. She reports that she does not drink alcohol or use illicit drugs.  History reviewed. No pertinent family history.  Review of Systems:  Constitutional: Denies fever, chills, diaphoresis, appetite change and fatigue.  HEENT: Denies photophobia, eye pain, redness, hearing loss, ear pain, congestion, sore throat, rhinorrhea, sneezing, mouth sores,  trouble swallowing, neck pain, neck stiffness and tinnitus.   Respiratory: Denies SOB, DOE, cough, chest tightness,  and wheezing.   Cardiovascular: Denies chest pain, palpitations and leg swelling.  Gastrointestinal: Denies nausea, vomiting, abdominal pain, diarrhea, constipation, blood in stool and abdominal distention.  Genitourinary: Denies dysuria, urgency, frequency, hematuria, flank pain and difficulty urinating.  Musculoskeletal: Denies myalgias, back pain, joint swelling, arthralgias and gait problem.  Skin: Denies pallor, rash and wound.  Neurological: Denies dizziness, seizures, syncope, weakness, light-headedness, numbness and headaches.  Hematological: Denies adenopathy. Easy bruising, personal or family bleeding history  Psychiatric/Behavioral: Denies suicidal ideation, mood changes, confusion, nervousness, sleep disturbance and agitation   Physical Exam: Blood pressure 148/70, pulse 74, temperature 102.1 F (38.9 C), temperature source Oral, resp. rate 22, SpO2 95.00%. Gen. alert awake oriented to self place and time, no acute distress HEENT oral mucosa moist and pink several missing teeth and dental hygiene, neck no JVD CVS S1-S2 regular rate rhythm, faint systolic murmur appreciated Lungs : Bibasal crackles worse in the left lower lobe Abdomen soft nontender with normal bowel sounds no organomegaly Extremities no edema clubbing or cyanosis Neuro moves all extremities no localizing signs Skin no rashes, erythema or skin breakdown   Labs on Admission:  Results for orders placed during the hospital encounter of 09/05/11 (from the past 48 hour(s))  CBC     Status: Abnormal   Collection Time   09/05/11  4:40 PM      Component Value Range Comment   WBC 17.2 (*) 4.0 - 10.5 K/uL    RBC 3.72 (*) 3.87 - 5.11 MIL/uL    Hemoglobin 11.3 (*) 12.0 - 15.0 g/dL    HCT 96.0 (*) 45.4 - 46.0 %    MCV 93.8  78.0 - 100.0 fL    MCH 30.4  26.0 - 34.0 pg    MCHC 32.4  30.0 - 36.0 g/dL     RDW 09.8  11.9 - 14.7 %    Platelets 156  150 - 400 K/uL   BASIC METABOLIC PANEL     Status: Abnormal   Collection Time   09/05/11  4:40 PM      Component Value Range Comment   Sodium 139  135 - 145 mEq/L    Potassium 3.6  3.5 - 5.1 mEq/L    Chloride 102  96 - 112 mEq/L    CO2 28  19 - 32 mEq/L    Glucose, Bld 97  70 - 99 mg/dL    BUN 15  6 - 23 mg/dL    Creatinine, Ser 8.29  0.50 - 1.10 mg/dL    Calcium 8.9  8.4 - 56.2 mg/dL    GFR calc non Af Amer 76 (*) >90 mL/min    GFR calc Af  Amer 88 (*) >90 mL/min     Radiological Exams on Admission: Dg Chest 2 View  09/05/2011  *RADIOLOGY REPORT*  Clinical Data: Chest congestion and fatigue.  Leukocytosis. Shortness of breath.  CHEST - 2 VIEW  Comparison: None.  Findings: Patchy lingular and left lower lobe airspace opacity with obscuration of the left heart borders.  Probable small left pleural effusion.  Clear right lung.  Grossly normal sized heart.  Moderate to large sized hiatal hernia.  Bilateral shoulder degenerative changes.  Thoracolumbar scoliosis and degenerative changes. Cholecystectomy clips.  IMPRESSION:  1.  Lingular and left lower lobe pneumonia. 2.  Probable small left pleural effusion. 3.  Moderate to large sized hiatal hernia.  Clinically significant discrepancy from primary report, if provided: None  Original Report Authenticated By: Darrol Angel, M.D.    Assessment/Plan 1. Community acquired pneumonia Continue IV ceftriaxone and azithromycin Check urine Legionella and pneumococcal antigen Sputum cultures Supportive care with oxygen, IV fluids Also check swallow evaluation to rule out silent aspiration  2. Leukocytosis-secondary to above  3. GERD/Hiatal hernia-Continue protonix  4. Chronic low back pain/DJD- continue percocet PRN, Lyrica  CODE STATUS discussed the patient wishes to be DNR  Time Spent on Admission:  Mindi Akerson [facility] Pager: [phone] 09/05/2011, 6:13 PM

## 2011-09-05 NOTE — ED Notes (Signed)
Pt's o2 was 84% on room air, then I put pt on oxygen at 2% and it went up to 88 th I bumped pt up to 3% and it went to 91% with sitting straight up. 4:30 pm JG.

## 2011-09-06 DIAGNOSIS — D509 Iron deficiency anemia, unspecified: Secondary | ICD-10-CM

## 2011-09-06 DIAGNOSIS — D649 Anemia, unspecified: Secondary | ICD-10-CM

## 2011-09-06 DIAGNOSIS — E7800 Pure hypercholesterolemia, unspecified: Secondary | ICD-10-CM

## 2011-09-06 LAB — BASIC METABOLIC PANEL
BUN: 14 mg/dL (ref 6–23)
Creatinine, Ser: 0.65 mg/dL (ref 0.50–1.10)
GFR calc Af Amer: >90 mL/min (ref >90)
GFR calc non Af Amer: 81 mL/min — ABNORMAL LOW (ref >90)
Glucose, Bld: 90 mg/dL (ref 70–99)
Potassium: 3.5 mEq/L (ref 3.5–5.1)

## 2011-09-06 LAB — CBC
HCT: 31.5 % — ABNORMAL LOW (ref 36.0–46.0)
HCT: 30.2 % — ABNORMAL LOW (ref 36.0–46.0)
Hemoglobin: 9.9 g/dL — ABNORMAL LOW (ref 12.0–15.0)
MCH: 30.3 pg (ref 26.0–34.0)
MCHC: 32.4 g/dL (ref 30.0–36.0)
MCHC: 32.8 g/dL (ref 30.0–36.0)
MCV: 93.5 fL (ref 78.0–100.0)
MCV: 93.2 fL (ref 78.0–100.0)
Platelets: 159 10*3/uL (ref 150–400)
RDW: 13.6 % (ref 11.5–15.5)
RDW: 13.4 % (ref 11.5–15.5)
WBC: 17.9 10*3/uL — ABNORMAL HIGH (ref 4.0–10.5)

## 2011-09-06 LAB — LEGIONELLA ANTIGEN, URINE: Legionella Antigen, Urine: NEGATIVE

## 2011-09-06 LAB — EXPECTORATED SPUTUM ASSESSMENT W GRAM STAIN, RFLX TO RESP C

## 2011-09-06 MED ORDER — ENSURE COMPLETE PO LIQD
237.0000 mL | Freq: Three times a day (TID) | ORAL | Status: DC
  Administered 2011-09-06 – 2011-09-07 (×3): 237 mL via ORAL

## 2011-09-06 NOTE — Evaluation (Signed)
Clinical/Bedside Swallow Evaluation Patient Details  Name: Cathy [name] MRN: [id number] Date of Birth: August 14, 1927  Today's Date: 09/06/2011 Time: 7829-5621 SLP Time Calculation (min): 10 min  Past Medical History:  Past Medical History  Diagnosis Date  . Allergy history unknown   . Hypertension   . Venous insufficiency   . Hypercholesteremia   . Hiatal hernia   . Dysphagia   . Diverticulosis of colon   . Irritable bowel syndrome   . Colitis     lymphocytic colitis feb 2011  . UTI (lower urinary tract infection)   . Urinary incontinence   . DJD (degenerative joint disease)   . Lumbar back pain   . Osteopenia   . Anxiety   . Anemia   . Iron deficiency   . Vitamin B12 deficiency    Past Surgical History:  Past Surgical History  Procedure Date  . Cataract surgery   . Abdominal hysterectomy   . Decompressive lumbar laminectomy 06/2008    at L2-3, L3-4 and L4-5 by Dr. Yetta Barre  . Laparoscopic cholecystectomy 04/2009    Dr. Odie Sera   HPI:  78 yr old admitted with general weakness, cold symptoms, chest congestion.  CXR revealed LLL pna.  PMH:  large hiatal hernia, IBS, HTN.  Pt. states occasionaly coughs with food..   Assessment / Plan / Recommendation Clinical Impression  Pt. consumed thin liquid via cup and straw, applesauce and cracker without indications of oropharyngeal dysphagia.  Mastication and transit to pharynx with cracker was functional.  Observed pt. take multiple pills with straw sip liquid without difficulty.  No obvious signs of esophageal deficits.  Given no s/s aspiration, no history of neurological deficits do not suspect aspiration during the swallow.  It is possible that she may have aspirated from reflux.  Reviewed reflux precautions with pt. and clinical reasoning.  No MBS recommended.  If symptoms worsen or pna becomes recurrent, would recommend a repeat esophagram or further GI workup.      Aspiration Risk  Mild    Diet Recommendation Regular;Thin  liquid   Liquid Administration via: Cup;Straw Medication Administration: Whole meds with liquid Supervision: Patient able to self feed Compensations: Follow solids with liquid Postural Changes and/or Swallow Maneuvers: Seated upright 90 degrees;Upright 30-60 min after meal    Other  Recommendations Oral Care Recommendations: Oral care BID   Follow Up Recommendations  None    Frequency and Duration               Swallow Study Prior Functional Status       General HPI: 78 yr old admitted with general weakness, cold symptoms, chest congestion.  CXR revealed LLL pna.  PMH:  large hiatal hernia, IBS, HTN.  Pt. states occasionaly coughs with food.. Type of Study: Bedside swallow evaluation Previous Swallow Assessment:  (esophagram 2011, large HH) Diet Prior to this Study: Regular;Thin liquids Temperature Spikes Noted: Yes (99.5) Respiratory Status: Room air History of Recent Intubation: No Behavior/Cognition: Alert;Cooperative;Pleasant mood Oral Cavity - Dentition: Adequate natural dentition Self-Feeding Abilities: Able to feed self Patient Positioning: Upright in bed Baseline Vocal Quality: Clear Volitional Cough: Strong Volitional Swallow: Able to elicit    Oral/Motor/Sensory Function Overall Oral Motor/Sensory Function: Appears within functional limits for tasks assessed   Ice Chips Ice chips: Not tested   Thin Liquid Thin Liquid: Within functional limits Presentation: Cup;Straw    Nectar Thick Nectar Thick Liquid: Not tested   Honey Thick Honey Thick Liquid: Not tested   Puree Puree: Within functional  limits   Solid Solid: Within functional limits    Breck Coons [employer]Ed [employer] [phone]  09/06/2011

## 2011-09-06 NOTE — Care Management Note (Signed)
    Page 1 of 2   09/09/2011     2:14:54 PM   CARE MANAGEMENT NOTE 09/09/2011  Patient:  Cathy [name], Cathy [name]   Account Number:  [id number]  Date Initiated:  09/06/2011  Documentation initiated by:  Letha Cape  Subjective/Objective Assessment:   dx pna  admit- lives alone.  pta independent.  Patient's daughter Corrie Dandy [phone] and her son comes by to check on her quiet often.     Action/Plan:   pt eval- recs hhpt   Anticipated DC Date:  09/09/2011   Anticipated DC Plan:  HOME W HOME HEALTH SERVICES      DC Planning Services  CM consult      [facility]  HOME HEALTH   Choice offered to / List presented to:  C-1 Patient        HH arranged  HH - 11 Patient Refused      Status of service:  Completed, signed off Medicare Important Message given?   (If response is "NO", the following Medicare IM given date fields will be blank) Date Medicare IM given:   Date Additional Medicare IM given:    Discharge Disposition:  HOME/SELF CARE  Per UR Regulation:  Reviewed for med. necessity/level of care/duration of stay  If discussed at Long Length of Stay Meetings, dates discussed:    Comments:  PCP Alroy Dust  09/09/11 14:11 Letha Cape RN, BSN [phone] patient for dc to day, physical therapy recs hhpt, but patient states she does not need physical therapy.  09/06/11 10:29 Letha Cape RN, BSN [phone] patient lives alone, await pt eval.   Pta independent. Patient states that her daughter and son comes by quiet often to check on her.  Patient has medication coverage and transportation.  Patient states she drives her self to her MD apppointments.   NCM will continue to follow for dc needs.

## 2011-09-06 NOTE — Evaluation (Signed)
Physical Therapy Evaluation Patient Details Name: Cathy [name] MRN: [id number] DOB: [birth year] Today's Date: 09/06/2011 Time: 9562-1308 PT Time Calculation (min): 21 min  PT Assessment / Plan / Recommendation Clinical Impression  Pt adm with PNA.  Pt needs skilled PT to maximize I and safety so she can return home.    PT Assessment  Patient needs continued PT services    Follow Up Recommendations  Home health PT    Barriers to Discharge        Equipment Recommendations  None recommended by PT    Recommendations for Other Services OT consult   Frequency Min 3X/week    Precautions / Restrictions Precautions Precautions: Fall   Pertinent Vitals/Pain SaO2 86% on RA after walking. 90% once O2 replaced.      Mobility  Bed Mobility Bed Mobility: Supine to Sit;Sitting - Scoot to Edge of Bed Supine to Sit: 7: Independent Sitting - Scoot to Edge of Bed: 7: Independent Transfers Transfers: Sit to Stand;Stand to Sit Sit to Stand: 4: Min assist;From bed;With upper extremity assist Stand to Sit: 4: Min assist;To toilet;To chair/3-in-1;With upper extremity assist;With armrests Details for Transfer Assistance: assist for balance Ambulation/Gait Ambulation/Gait Assistance: 4: Min assist Ambulation Distance (Feet): 65 Feet Assistive device: Other (Comment) (IV pole and wall rail) Ambulation/Gait Assistance Details: Staggering at times. Gait Pattern: Right flexed knee in stance;Left flexed knee in stance;Trunk flexed    Exercises     PT Diagnosis: Difficulty walking;Generalized weakness  PT Problem List: Decreased strength;Decreased activity tolerance;Decreased balance;Decreased mobility;Decreased knowledge of use of DME PT Treatment Interventions: DME instruction;Gait training;Functional mobility training;Therapeutic activities;Therapeutic exercise;Balance training;Patient/family education   PT Goals Acute Rehab PT Goals PT Goal Formulation: With patient Time For Goal  Achievement: 09/13/11 Potential to Achieve Goals: Good Pt will go Sit to Stand: with modified independence PT Goal: Sit to Stand - Progress: Goal set today Pt will go Stand to Sit: with modified independence PT Goal: Stand to Sit - Progress: Goal set today Pt will Ambulate: 51 - 150 feet;with least restrictive assistive device;with modified independence PT Goal: Ambulate - Progress: Goal set today  Visit Information  Last PT Received On: 09/06/11 Assistance Needed: +1    Subjective Data  Subjective: "I didn't know I had gotten so weak." Patient Stated Goal: Return home   Prior Functioning  Home Living Lives With: Alone Available Help at Discharge: Family;Available PRN/intermittently Type of Home: House Home Access: Stairs to enter [employer] of Steps: 4 Entrance Stairs-Rails: Right Home Layout: One level Bathroom Shower/Tub: Engineer, manufacturing systems: Handicapped height Home Adaptive Equipment: Walker - rolling;Tub transfer bench Additional Comments: equipment was husbands Prior Function Level of Independence: Independent with assistive device(s) Able to Take Stairs?: Yes Driving: Yes Comments: Was not using assistive device for amb. Communication Communication: HOH    Cognition  Overall Cognitive Status: Appears within functional limits for tasks assessed/performed Arousal/Alertness: Awake/alert Orientation Level: Appears intact for tasks assessed Behavior During Session: [facility] for tasks performed    Extremity/Trunk Assessment Right Lower Extremity Assessment RLE ROM/Strength/Tone: Deficits RLE ROM/Strength/Tone Deficits: grossly 4/5 Left Lower Extremity Assessment LLE ROM/Strength/Tone: Deficits LLE ROM/Strength/Tone Deficits: grossly 4/5   Balance Dynamic Sitting Balance Dynamic Sitting - Balance Support: During functional activity;Feet supported Dynamic Sitting - Level of Assistance: 7: Independent  End of Session PT - End of Session Equipment  Utilized During Treatment: Gait belt Activity Tolerance: Patient tolerated treatment well Patient left: in chair Nurse Communication: Mobility status  GP     [facility] 09/06/2011,  2:53 PM  [facility] PT [phone]

## 2011-09-06 NOTE — Progress Notes (Signed)
Brief Nutrition Note  Patient identified on the Nutrition Risk Report for problems chewing or swallowing.   Body mass index is 29.05 kg/(m^2). Pt meets criteria for overweight based on current BMI. Pt reports normal appetite and intake PTA.   Current diet order is low sodium, patient has no documented meals intake at this time. Pt was seen by SLP for bedside swallow evaluation today, recommended regular consistency with thin liquids.  Labs and medications reviewed.   No further nutrition interventions warranted at this time. If additional nutrition issues arise, please re-consult RD.   Clarene Duke RD, LDN Pager [phone] After Hours pager [phone]

## 2011-09-06 NOTE — Progress Notes (Signed)
PATIENT DETAILS Name: Cathy [name] Age: 78 y.o. Sex: female Date of Birth: 1927-03-11 Admit Date: 09/05/2011 ZOX:[id number],VWUJW M, MD  Subjective: Continues to have fever  Objective: Vital signs in last 24 hours: Filed Vitals:   09/05/11 1911 09/05/11 2135 09/06/11 0605 09/06/11 0712  BP: 89/46 113/63 135/67   Pulse: 67 63 73   Temp: 100.3 F (37.9 C) 98.7 F (37.1 C) 102 F (38.9 C) 99.5 F (37.5 C)  TempSrc: Oral Oral Oral   Resp: 20 18 18    Height: 4\' 9"  (1.448 m)     Weight: 60.9 kg (134 lb 4.2 oz)     SpO2: 93% 92% 94%     Weight change:   Body mass index is 29.05 kg/(m^2).  Intake/Output from previous day:  Intake/Output Summary (Last 24 hours) at 09/06/11 1235 Last data filed at 09/06/11 0500  Gross per 24 hour  Intake 583.75 ml  Output    300 ml  Net 283.75 ml    PHYSICAL EXAM: Gen Exam: Awake and alert with clear speech.  Neck: Supple, No JVD.   Chest: B/L good air entry, rales in the left lung base area CVS: S1 S2 Regular, no murmurs.  Abdomen: soft, BS +, non tender, non distended.  Extremities: no edema, lower extremities warm to touch. Neurologic: Non Focal.   Skin: No Rash.   Wounds: N/A.    CONSULTS:  None  LAB RESULTS: CBC  Lab 09/06/11 0630 09/05/11 2210 09/05/11 1640 09/05/11 1458  WBC 15.3* 17.9* 17.2* 15.0*  HGB 9.9* 10.2* 11.3* 11.1*  HCT 30.2* 31.5* 34.9* 36.6*  PLT 136* 159 156 --  MCV 93.2 93.5 93.8 96.4  MCH 30.6 30.3 30.4 29.2  MCHC 32.8 32.4 32.4 30.3*  RDW 13.4 13.6 13.2 --  LYMPHSABS -- -- -- --  MONOABS -- -- -- --  EOSABS -- -- -- --  BASOSABS -- -- -- --  BANDABS -- -- -- --    Chemistries   Lab 09/06/11 0630 09/05/11 2210 09/05/11 1640  NA 140 -- 139  K 3.5 -- 3.6  CL 105 -- 102  CO2 26 -- 28  GLUCOSE 90 -- 97  BUN 14 -- 15  CREATININE 0.65 0.68 0.79  CALCIUM 8.1* -- 8.9  MG -- -- --    CBG: No results found for this basename: GLUCAP:5 in the last 168 hours  GFR Estimated Creatinine Clearance:  41.4 ml/min (by C-G formula based on Cr of 0.65).  Coagulation profile No results found for this basename: INR:5,PROTIME:5 in the last 168 hours  Cardiac Enzymes No results found for this basename: CK:3,CKMB:3,TROPONINI:3,MYOGLOBIN:3 in the last 168 hours  No components found with this basename: POCBNP:3 No results found for this basename: DDIMER:2 in the last 72 hours No results found for this basename: HGBA1C:2 in the last 72 hours No results found for this basename: CHOL:2,HDL:2,LDLCALC:2,TRIG:2,CHOLHDL:2,LDLDIRECT:2 in the last 72 hours No results found for this basename: TSH,T4TOTAL,FREET3,T3FREE,THYROIDAB in the last 72 hours No results found for this basename: VITAMINB12:2,FOLATE:2,FERRITIN:2,TIBC:2,IRON:2,RETICCTPCT:2 in the last 72 hours No results found for this basename: LIPASE:2,AMYLASE:2 in the last 72 hours  Urine Studies No results found for this basename: UACOL:2,UAPR:2,USPG:2,UPH:2,UTP:2,UGL:2,UKET:2,UBIL:2,UHGB:2,UNIT:2,UROB:2,ULEU:2,UEPI:2,UWBC:2,URBC:2,UBAC:2,CAST:2,CRYS:2,UCOM:2,BILUA:2 in the last 72 hours  MICROBIOLOGY: Recent Results (from the past 240 hour(s))  CULTURE, BLOOD (ROUTINE X 2)     Status: Normal (Preliminary result)   Collection Time   09/05/11  4:40 PM      Component Value Range Status Comment   Specimen Description BLOOD RIGHT ARM  Final    Special Requests BOTTLES DRAWN AEROBIC AND ANAEROBIC 10CC   Final    Culture  Setup Time 09/05/2011 22:25   Final    Culture     Final    Value:        BLOOD CULTURE RECEIVED NO GROWTH TO DATE CULTURE WILL BE HELD FOR 5 DAYS BEFORE ISSUING A FINAL NEGATIVE REPORT   Report Status PENDING   Incomplete   CULTURE, BLOOD (ROUTINE X 2)     Status: Normal (Preliminary result)   Collection Time   09/05/11  4:51 PM      Component Value Range Status Comment   Specimen Description BLOOD RIGHT HAND   Final    Special Requests BOTTLES DRAWN AEROBIC ONLY [facility]   Final    Culture  Setup Time 09/05/2011 22:25   Final     Culture     Final    Value:        BLOOD CULTURE RECEIVED NO GROWTH TO DATE CULTURE WILL BE HELD FOR 5 DAYS BEFORE ISSUING A FINAL NEGATIVE REPORT   Report Status PENDING   Incomplete   MRSA PCR SCREENING     Status: Normal   Collection Time   09/05/11 10:57 PM      Component Value Range Status Comment   MRSA by PCR NEGATIVE  NEGATIVE Final   CULTURE, EXPECTORATED SPUTUM-ASSESSMENT     Status: Normal   Collection Time   09/06/11  2:30 AM      Component Value Range Status Comment   Specimen Description SPUTUM   Final    Special Requests NONE   Final    Sputum evaluation     Final    Value: THIS SPECIMEN IS ACCEPTABLE. RESPIRATORY CULTURE REPORT TO FOLLOW.   Report Status 09/06/2011 FINAL   Final     RADIOLOGY STUDIES/RESULTS: Dg Chest 2 View  09/05/2011  *RADIOLOGY REPORT*  Clinical Data: Chest congestion and fatigue.  Leukocytosis. Shortness of breath.  CHEST - 2 VIEW  Comparison: None.  Findings: Patchy lingular and left lower lobe airspace opacity with obscuration of the left heart borders.  Probable small left pleural effusion.  Clear right lung.  Grossly normal sized heart.  Moderate to large sized hiatal hernia.  Bilateral shoulder degenerative changes.  Thoracolumbar scoliosis and degenerative changes. Cholecystectomy clips.  IMPRESSION:  1.  Lingular and left lower lobe pneumonia. 2.  Probable small left pleural effusion. 3.  Moderate to large sized hiatal hernia.  Clinically significant discrepancy from primary report, if provided: None  Original Report Authenticated By: Darrol Angel, M.D.    MEDICATIONS: Scheduled Meds:   . acetaminophen  650 mg Oral Once  . aspirin  81 mg Oral Daily  . azithromycin  500 mg Intravenous Q24H  . cefTRIAXone (ROCEPHIN)  IV  1 g Intravenous Once  . cefTRIAXone (ROCEPHIN)  IV  1 g Intravenous Q24H  . enoxaparin (LOVENOX) injection  40 mg Subcutaneous Q24H  . ferrous sulfate  325 mg Oral BID WC  . fesoterodine  8 mg Oral Daily  . loratadine  10 mg  Oral Daily  . pantoprazole  40 mg Oral Daily  . pneumococcal 23 valent vaccine  0.5 mL Intramuscular Tomorrow-1000  . pregabalin  50 mg Oral TID  . simvastatin  40 mg Oral QHS  . sodium chloride  500 mL Intravenous Once  . DISCONTD: aspirin  81 mg Oral Daily  . DISCONTD: azithromycin  500 mg Intravenous Once  . DISCONTD:  Ferrous Sulfate Dried  1 tablet Oral BID   Continuous Infusions:   . sodium chloride 75 mL/hr at 09/05/11 2233   PRN Meds:.acetaminophen, acetaminophen, ondansetron (ZOFRAN) IV, ondansetron, oxyCODONE-acetaminophen  Antibiotics: Anti-infectives     Start     Dose/Rate Route Frequency Ordered Stop   09/06/11 1930   cefTRIAXone (ROCEPHIN) 1 g in dextrose 5 % 50 mL IVPB        1 g 100 mL/hr over 30 Minutes Intravenous Every 24 hours 09/05/11 1935     09/06/11 1830   azithromycin (ZITHROMAX) 500 mg in dextrose 5 % 250 mL IVPB        500 mg 250 mL/hr over 60 Minutes Intravenous Every 24 hours 09/05/11 1935     09/05/11 1630   cefTRIAXone (ROCEPHIN) 1 g in dextrose 5 % 50 mL IVPB        1 g 100 mL/hr over 30 Minutes Intravenous  Once 09/05/11 1628 09/05/11 1803   09/05/11 1630   azithromycin (ZITHROMAX) 500 mg in dextrose 5 % 250 mL IVPB  Status:  Discontinued        500 mg 250 mL/hr over 60 Minutes Intravenous  Once 09/05/11 1628 09/05/11 1915          Assessment/Plan: Active Problems: 1. Community Acquired PNA -still febrile, but with decrease in leukocytosis -continue with Rocephin/Zithromax, monitor closely, hopefully will defervesce soon -await sputum cultures -7/8 -blood cultures 7/7-negative so far -Urine Strep Pneumo antigen-negative  2. Dyslipidemia -c/w statins  3. Anemia -drop in Hb likely 2/2 to IVF, acute illness -continue with Iron supplementation-take it as outpatient as well.  4. GERD -Stable, c/w PPI  5. Urinary Incontinence -c/w with Toviaz  6. IBS -stable  Disposition: -remain inpatient  DVT  Prophylaxis: -Lovenox  Code Status: DNR-reconfirmed with patient today  Jeoffrey Massed, MD  [facility] Pager [phone]  If 7PM-7AM, please contact night-coverage [email or web address] Password [email or web address] 09/06/2011, 12:35 PM   LOS: 1 day

## 2011-09-07 DIAGNOSIS — R509 Fever, unspecified: Secondary | ICD-10-CM

## 2011-09-07 LAB — DIFFERENTIAL
Basophils Absolute: 0 10*3/uL (ref 0.0–0.1)
Lymphocytes Relative: 10 % — ABNORMAL LOW (ref 12–46)
Lymphs Abs: 1.3 10*3/uL (ref 0.7–4.0)
Neutro Abs: 10.6 10*3/uL — ABNORMAL HIGH (ref 1.7–7.7)

## 2011-09-07 LAB — CBC
HCT: 29.4 % — ABNORMAL LOW (ref 36.0–46.0)
MCV: 92.5 fL (ref 78.0–100.0)
Platelets: 159 10*3/uL (ref 150–400)
RBC: 3.18 MIL/uL — ABNORMAL LOW (ref 3.87–5.11)
RDW: 12.9 % (ref 11.5–15.5)
WBC: 13.3 10*3/uL — ABNORMAL HIGH (ref 4.0–10.5)

## 2011-09-07 LAB — BASIC METABOLIC PANEL
CO2: 25 mEq/L (ref 19–32)
Chloride: 106 mEq/L (ref 96–112)
Glucose, Bld: 95 mg/dL (ref 70–99)
Sodium: 141 mEq/L (ref 135–145)

## 2011-09-07 MED ORDER — POTASSIUM CHLORIDE 10 MEQ/100ML IV SOLN
10.0000 meq | INTRAVENOUS | Status: AC
  Administered 2011-09-07 (×2): 10 meq via INTRAVENOUS
  Filled 2011-09-07 (×4): qty 100

## 2011-09-07 MED ORDER — POTASSIUM CHLORIDE 10 MEQ/100ML IV SOLN
10.0000 meq | INTRAVENOUS | Status: AC
  Administered 2011-09-07 (×2): 10 meq via INTRAVENOUS
  Filled 2011-09-07 (×2): qty 100

## 2011-09-07 MED ORDER — SODIUM CHLORIDE 0.9 % IV SOLN: INTRAVENOUS | Status: AC

## 2011-09-07 NOTE — Progress Notes (Signed)
Physical Therapy Treatment Patient Details Name: Cathy [name] MRN: [id number] DOB: Oct 29, 1927 Today's Date: 09/07/2011 Time: 0865-7846 PT Time Calculation (min): 17 min  PT Assessment / Plan / Recommendation Comments on Treatment Session  Good progress with RW however need one that is short enough for her (she is 4'11). Pt a littel frustrated that she couldn't walk this morning because her potassium is low (2.8) however I will try to come back with a walker more her size a bit later and try to walk. Expect pt will be OK to d/c home with RW and intermitent supervision from family.     Follow Up Recommendations  Home health PT;Supervision - Intermittent    Barriers to Discharge        Equipment Recommendations  None recommended by PT    Recommendations for Other Services    Frequency     Plan Discharge plan remains appropriate;Frequency remains appropriate    Precautions / Restrictions Precautions Precautions: Fall Restrictions Weight Bearing Restrictions: No       Mobility  Bed Mobility Details for Bed Mobility Assistance: pt sitting EOB on my arrival Transfers Transfers: Sit to Stand;Stand to Sit Sit to Stand: 5: Supervision;With upper extremity assist;From bed;From toilet Stand to Sit: 5: Supervision;With upper extremity assist;To chair/3-in-1;To toilet Details for Transfer Assistance: no physical assist needed but pt moving slower and needing upper extremities due to lower extremity weakness Ambulation/Gait Ambulation/Gait Assistance: 4: Min guard Ambulation Distance (Feet): 20 Feet Assistive device: Rolling walker Ambulation/Gait Assistance Details: cues to slow down especially when trying to negotiate smaller spaces with RW, cues for safe positioning in prep to sit Gait Pattern: Trunk flexed;Right flexed knee in stance;Left flexed knee in stance    Exercises      PT Goals Acute Rehab PT Goals PT Goal: Sit to Stand - Progress: Progressing toward goal PT Goal: Stand  to Sit - Progress: Progressing toward goal PT Goal: Ambulate - Progress: Progressing toward goal  Visit Information  Last PT Received On: 09/07/11 Assistance Needed: +1    Subjective Data  Subjective: I was really hoping to go walking.    Cognition  Overall Cognitive Status: Appears within functional limits for tasks assessed/performed Arousal/Alertness: Awake/alert Orientation Level: Appears intact for tasks assessed Behavior During Session: [facility] for tasks performed    Balance     End of Session PT - End of Session Equipment Utilized During Treatment: Gait belt Activity Tolerance: Treatment limited secondary to medical complications (Comment) (low potassium, RN to start runs of potassium) Patient left: in chair;with call bell/phone within reach Nurse Communication: Mobility status   GP     [facility] HELEN 09/07/2011, 9:54 AM

## 2011-09-07 NOTE — Progress Notes (Signed)
PATIENT DETAILS Name: Cathy [name] Age: 78 y.o. Sex: female Date of Birth: 04-24-1927 Admit Date: 09/05/2011 ZOX:[id number],VWUJW M, MD  Subjective: Looks better, fever curve decreasing  Objective: Vital signs in last 24 hours: Filed Vitals:   09/06/11 2225 09/07/11 0456 09/07/11 0930 09/07/11 1322  BP: 129/66 154/68    Pulse: 69 62    Temp: 100 F (37.8 C) 99.8 F (37.7 C)    TempSrc:  Oral    Resp: 18 18    Height:      Weight:      SpO2: 93% 96% 93% 94%    Weight change:   Body mass index is 29.05 kg/(m^2).  Intake/Output from previous day:  Intake/Output Summary (Last 24 hours) at 09/07/11 1425 Last data filed at 09/07/11 0900  Gross per 24 hour  Intake   2125 ml  Output    301 ml  Net   1824 ml    PHYSICAL EXAM: Gen Exam: Awake and alert with clear speech.  Neck: Supple, No JVD.   Chest: B/L good air entry, rales in the left lung base area CVS: S1 S2 Regular, no murmurs.  Abdomen: soft, BS +, non tender, non distended.  Extremities: no edema, lower extremities warm to touch. Neurologic: Non Focal.   Skin: No Rash.   Wounds: N/A.    CONSULTS:  None  LAB RESULTS: CBC  Lab 09/07/11 0603 09/06/11 0630 09/05/11 2210 09/05/11 1640 09/05/11 1458  WBC 13.3* 15.3* 17.9* 17.2* 15.0*  HGB 9.8* 9.9* 10.2* 11.3* 11.1*  HCT 29.4* 30.2* 31.5* 34.9* 36.6*  PLT 159 136* 159 156 --  MCV 92.5 93.2 93.5 93.8 96.4  MCH 30.8 30.6 30.3 30.4 29.2  MCHC 33.3 32.8 32.4 32.4 30.3*  RDW 12.9 13.4 13.6 13.2 --  LYMPHSABS 1.3 -- -- -- --  MONOABS 1.4* -- -- -- --  EOSABS 0.0 -- -- -- --  BASOSABS 0.0 -- -- -- --  BANDABS -- -- -- -- --    Chemistries   Lab 09/07/11 0603 09/06/11 0630 09/05/11 2210 09/05/11 1640  NA 141 140 -- 139  K 2.8* 3.5 -- 3.6  CL 106 105 -- 102  CO2 25 26 -- 28  GLUCOSE 95 90 -- 97  BUN 11 14 -- 15  CREATININE 0.55 0.65 0.68 0.79  CALCIUM 8.4 8.1* -- 8.9  MG -- -- -- --    CBG: No results found for this basename: GLUCAP:5 in the last  168 hours  GFR Estimated Creatinine Clearance: 41.4 ml/min (by C-G formula based on Cr of 0.55).  Coagulation profile No results found for this basename: INR:5,PROTIME:5 in the last 168 hours  Cardiac Enzymes No results found for this basename: CK:3,CKMB:3,TROPONINI:3,MYOGLOBIN:3 in the last 168 hours  No components found with this basename: POCBNP:3 No results found for this basename: DDIMER:2 in the last 72 hours No results found for this basename: HGBA1C:2 in the last 72 hours No results found for this basename: CHOL:2,HDL:2,LDLCALC:2,TRIG:2,CHOLHDL:2,LDLDIRECT:2 in the last 72 hours No results found for this basename: TSH,T4TOTAL,FREET3,T3FREE,THYROIDAB in the last 72 hours No results found for this basename: VITAMINB12:2,FOLATE:2,FERRITIN:2,TIBC:2,IRON:2,RETICCTPCT:2 in the last 72 hours No results found for this basename: LIPASE:2,AMYLASE:2 in the last 72 hours  Urine Studies No results found for this basename: UACOL:2,UAPR:2,USPG:2,UPH:2,UTP:2,UGL:2,UKET:2,UBIL:2,UHGB:2,UNIT:2,UROB:2,ULEU:2,UEPI:2,UWBC:2,URBC:2,UBAC:2,CAST:2,CRYS:2,UCOM:2,BILUA:2 in the last 72 hours  MICROBIOLOGY: Recent Results (from the past 240 hour(s))  CULTURE, BLOOD (ROUTINE X 2)     Status: Normal (Preliminary result)   Collection Time   09/05/11  4:40 PM  Component Value Range Status Comment   Specimen Description BLOOD RIGHT ARM   Final    Special Requests BOTTLES DRAWN AEROBIC AND ANAEROBIC 10CC   Final    Culture  Setup Time 09/05/2011 22:25   Final    Culture     Final    Value:        BLOOD CULTURE RECEIVED NO GROWTH TO DATE CULTURE WILL BE HELD FOR 5 DAYS BEFORE ISSUING A FINAL NEGATIVE REPORT   Report Status PENDING   Incomplete   CULTURE, BLOOD (ROUTINE X 2)     Status: Normal (Preliminary result)   Collection Time   09/05/11  4:51 PM      Component Value Range Status Comment   Specimen Description BLOOD RIGHT HAND   Final    Special Requests BOTTLES DRAWN AEROBIC ONLY [facility]   Final     Culture  Setup Time 09/05/2011 22:25   Final    Culture     Final    Value:        BLOOD CULTURE RECEIVED NO GROWTH TO DATE CULTURE WILL BE HELD FOR 5 DAYS BEFORE ISSUING A FINAL NEGATIVE REPORT   Report Status PENDING   Incomplete   MRSA PCR SCREENING     Status: Normal   Collection Time   09/05/11 10:57 PM      Component Value Range Status Comment   MRSA by PCR NEGATIVE  NEGATIVE Final   CULTURE, EXPECTORATED SPUTUM-ASSESSMENT     Status: Normal   Collection Time   09/06/11  2:30 AM      Component Value Range Status Comment   Specimen Description SPUTUM   Final    Special Requests NONE   Final    Sputum evaluation     Final    Value: THIS SPECIMEN IS ACCEPTABLE. RESPIRATORY CULTURE REPORT TO FOLLOW.   Report Status 09/06/2011 FINAL   Final   CULTURE, RESPIRATORY     Status: Normal (Preliminary result)   Collection Time   09/06/11  2:30 AM      Component Value Range Status Comment   Specimen Description SPUTUM   Final    Special Requests NONE   Final    Gram Stain     Final    Value: ABUNDANT WBC PRESENT, PREDOMINANTLY PMN     NO SQUAMOUS EPITHELIAL CELLS SEEN     MODERATE GRAM POSITIVE COCCI IN PAIRS     RARE GRAM NEGATIVE COCCOBACILLI   Culture NORMAL OROPHARYNGEAL FLORA   Final    Report Status PENDING   Incomplete     RADIOLOGY STUDIES/RESULTS: Dg Chest 2 View  09/05/2011  *RADIOLOGY REPORT*  Clinical Data: Chest congestion and fatigue.  Leukocytosis. Shortness of breath.  CHEST - 2 VIEW  Comparison: None.  Findings: Patchy lingular and left lower lobe airspace opacity with obscuration of the left heart borders.  Probable small left pleural effusion.  Clear right lung.  Grossly normal sized heart.  Moderate to large sized hiatal hernia.  Bilateral shoulder degenerative changes.  Thoracolumbar scoliosis and degenerative changes. Cholecystectomy clips.  IMPRESSION:  1.  Lingular and left lower lobe pneumonia. 2.  Probable small left pleural effusion. 3.  Moderate to large sized hiatal  hernia.  Clinically significant discrepancy from primary report, if provided: None  Original Report Authenticated By: Darrol Angel, M.D.    MEDICATIONS: Scheduled Meds:    . aspirin  81 mg Oral Daily  . azithromycin  500 mg Intravenous Q24H  . cefTRIAXone (  ROCEPHIN)  IV  1 g Intravenous Q24H  . enoxaparin (LOVENOX) injection  40 mg Subcutaneous Q24H  . feeding supplement  237 mL Oral TID WC  . ferrous sulfate  325 mg Oral BID WC  . fesoterodine  8 mg Oral Daily  . loratadine  10 mg Oral Daily  . pantoprazole  40 mg Oral Daily  . potassium chloride  10 mEq Intravenous Q1 Hr x 4  . pregabalin  50 mg Oral TID  . simvastatin  40 mg Oral QHS   Continuous Infusions:    . sodium chloride 75 mL/hr at 09/07/11 0722   PRN Meds:.acetaminophen, acetaminophen, ondansetron (ZOFRAN) IV, ondansetron, oxyCODONE-acetaminophen  Antibiotics: Anti-infectives     Start     Dose/Rate Route Frequency Ordered Stop   09/06/11 1930   cefTRIAXone (ROCEPHIN) 1 g in dextrose 5 % 50 mL IVPB        1 g 100 mL/hr over 30 Minutes Intravenous Every 24 hours 09/05/11 1935     09/06/11 1830   azithromycin (ZITHROMAX) 500 mg in dextrose 5 % 250 mL IVPB        500 mg 250 mL/hr over 60 Minutes Intravenous Every 24 hours 09/05/11 1935     09/05/11 1630   cefTRIAXone (ROCEPHIN) 1 g in dextrose 5 % 50 mL IVPB        1 g 100 mL/hr over 30 Minutes Intravenous  Once 09/05/11 1628 09/05/11 1803   09/05/11 1630   azithromycin (ZITHROMAX) 500 mg in dextrose 5 % 250 mL IVPB  Status:  Discontinued        500 mg 250 mL/hr over 60 Minutes Intravenous  Once 09/05/11 1628 09/05/11 1915          Assessment/Plan: Active Problems: 1. Community Acquired PNA -fever curve decreasing, clinically looking less toxic as well,  decrease in leukocytosis continues -continue with Rocephin/Zithromax, monitor closely and follow clinical course -sputum cultures -7/8-normal flora -blood cultures 7/7-negative so far -Urine Strep  Pneumo antigen-negative -Urine Legionella antigen-negative  2. Dyslipidemia -c/w statins  3. Anemia -drop in Hb likely 2/2 to IVF, acute illness -continue with Iron supplementation-take it as outpatient as well.  4. GERD -Stable, c/w PPI  5. Urinary Incontinence -c/w with Toviaz  6. IBS -stable  Disposition: -remain inpatient  DVT Prophylaxis: -Lovenox  Code Status: DNR-reconfirmed with patient today  Jeoffrey Massed, MD  [facility] Pager [phone]  If 7PM-7AM, please contact night-coverage [email or web address] Password [email or web address] 09/07/2011, 2:25 PM   LOS: 2 days

## 2011-09-08 DIAGNOSIS — R0902 Hypoxemia: Secondary | ICD-10-CM

## 2011-09-08 DIAGNOSIS — M1990 Unspecified osteoarthritis, unspecified site: Secondary | ICD-10-CM

## 2011-09-08 LAB — CULTURE, RESPIRATORY W GRAM STAIN

## 2011-09-08 LAB — BASIC METABOLIC PANEL
BUN: 6 mg/dL (ref 6–23)
CO2: 26 mEq/L (ref 19–32)
Calcium: 8.5 mg/dL (ref 8.4–10.5)
Chloride: 107 mEq/L (ref 96–112)
Creatinine, Ser: 0.54 mg/dL (ref 0.50–1.10)
Glucose, Bld: 100 mg/dL — ABNORMAL HIGH (ref 70–99)

## 2011-09-08 LAB — CBC
HCT: 29.1 % — ABNORMAL LOW (ref 36.0–46.0)
Hemoglobin: 9.7 g/dL — ABNORMAL LOW (ref 12.0–15.0)
MCH: 30.3 pg (ref 26.0–34.0)
MCV: 90.9 fL (ref 78.0–100.0)
RBC: 3.2 MIL/uL — ABNORMAL LOW (ref 3.87–5.11)
WBC: 10.7 10*3/uL — ABNORMAL HIGH (ref 4.0–10.5)

## 2011-09-08 MED ORDER — MOXIFLOXACIN HCL 400 MG PO TABS
400.0000 mg | ORAL_TABLET | Freq: Every day | ORAL | Status: DC
  Administered 2011-09-09: 400 mg via ORAL
  Filled 2011-09-08: qty 1

## 2011-09-08 MED ORDER — POTASSIUM CHLORIDE CRYS ER 20 MEQ PO TBCR
40.0000 meq | EXTENDED_RELEASE_TABLET | Freq: Two times a day (BID) | ORAL | Status: AC
  Administered 2011-09-08 – 2011-09-09 (×2): 40 meq via ORAL
  Filled 2011-09-08 (×2): qty 2

## 2011-09-08 MED ORDER — MOXIFLOXACIN HCL IN NACL 400 MG/250ML IV SOLN
400.0000 mg | Freq: Once | INTRAVENOUS | Status: AC
  Administered 2011-09-08: 400 mg via INTRAVENOUS
  Filled 2011-09-08: qty 250

## 2011-09-08 NOTE — Progress Notes (Signed)
Physical Therapy Treatment Patient Details Name: Cathy [name] MRN: [id number] DOB: May 14, 1927 Today's Date: 09/08/2011 Time: 4098-1191 PT Time Calculation (min): 21 min  PT Assessment / Plan / Recommendation Comments on Treatment Session  Ambulating great with RW, family to assist with meals and shopping.     Follow Up Recommendations  Home health PT;Supervision - Intermittent    Barriers to Discharge        Equipment Recommendations  None recommended by PT    Recommendations for Other Services    Frequency Min 3X/week   Plan Discharge plan remains appropriate;Frequency remains appropriate    Precautions / Restrictions Precautions Precautions: Fall       Mobility  Bed Mobility Details for Bed Mobility Assistance: pt sitting EOB Transfers Transfers: Sit to Stand;Stand to Sit Sit to Stand: 5: Supervision;With upper extremity assist;6: Modified independent (Device/Increase time);From bed Stand to Sit: 5: Supervision;With upper extremity assist;6: Modified independent (Device/Increase time);To chair/3-in-1 Details for Transfer Assistance: occasionally needs a cue for safe hand placement with regards to RW especially when sitting down Ambulation/Gait Ambulation/Gait Assistance: 5: Supervision Ambulation Distance (Feet): 200 Feet Assistive device: Rolling walker Ambulation/Gait Assistance Details: seated rest break following about 120 feet because of fatigue, cues for safe technique with RW Gait Pattern: Trunk flexed Stairs: Yes Stairs Assistance: 5: Supervision Stairs Assistance Details (indicate cue type and reason): gaurding for safety, no overt safety concerns, appropriately slowed for safety Stair Management Technique: One rail Right;One rail Left Number of Stairs: 3     Exercises     PT Goals Acute Rehab PT Goals PT Goal: Sit to Stand - Progress: Partly met PT Goal: Stand to Sit - Progress: Partly met PT Goal: Ambulate - Progress: Progressing toward goal  Visit  Information  Last PT Received On: 09/08/11 Assistance Needed: +1    Subjective Data  Subjective: I ate a banana this morning because they tell me my potassium is low.    Cognition  Overall Cognitive Status: Appears within functional limits for tasks assessed/performed Arousal/Alertness: Awake/alert Orientation Level: Appears intact for tasks assessed Behavior During Session: [facility] for tasks performed    Balance     End of Session PT - End of Session Equipment Utilized During Treatment: Gait belt Activity Tolerance: Patient tolerated treatment well Patient left: in chair;with call bell/phone within reach;with nursing in room Nurse Communication: Mobility status   GP     [facility] HELEN 09/08/2011, 1:26 PM

## 2011-09-08 NOTE — Progress Notes (Signed)
[facility] PROGRESS NOTE  Cathy [name] KGM:[id number] DOB: 06/09/1927 DOA: 09/05/2011   Assessment/Plan: Patient Active Hospital Problem List: Community acquired pneumonia (09/05/2011) -actually quite improved, not using oxygen with no labored breathing. Currently on Rocephin and Cipro were changed to Avelox. We'll watch overnight.  Dyslipidemia  -c/w statins   Anemia  -drop in Hb likely 2/2 to IVF, acute illness  -continue with Iron supplementation-take it as outpatient as well.   GERD  -Stable, c/w PPI   Urinary Incontinence  -c/w with Toviaz       LOS: 3 days   Procedures:    Antibiotics:  Rocephin and azithromycin  I  Subjective: No complaint  Objective: Filed Vitals:   09/07/11 1322 09/07/11 2031 09/08/11 0658 09/08/11 1100  BP: 159/68 144/71 161/74   Pulse: 69 73 64   Temp: 98.6 F (37 C) 99.8 F (37.7 C) 99.3 F (37.4 C)   TempSrc: Oral Oral Oral   Resp: 18 20 20    Height:      Weight:      SpO2: 94% 87% 91% 93%    Intake/Output Summary (Last 24 hours) at 09/08/11 1405 Last data filed at 09/08/11 0600  Gross per 24 hour  Intake 365.83 ml  Output      6 ml  Net 359.83 ml   Weight change:   Exam:  General: Alert, awake, oriented x3, in no acute distress.  HEENT: No bruits, no goiter.  Heart: Regular rate and rhythm, without murmurs, rubs, gallops.  Lungs: Good air movement, clear to auscultation bilaterally Abdomen: Soft, nontender, nondistended, positive bowel sounds.  Neuro: Grossly intact, nonfocal.   Data Reviewed: Basic Metabolic Panel:  Lab 09/08/11 6440 09/07/11 0603 09/06/11 0630 09/05/11 2210 09/05/11 1640  NA 142 141 140 -- 139  K 3.3* 2.8* -- -- --  CL 107 106 105 -- 102  CO2 26 25 26  -- 28  GLUCOSE 100* 95 90 -- 97  BUN 6 11 14  -- 15  CREATININE 0.54 0.55 0.65 0.68 0.79  CALCIUM 8.5 8.4 8.1* -- 8.9  MG -- -- -- -- --  PHOS -- -- -- -- --   Liver Function Tests: No results found for this basename:  AST:5,ALT:5,ALKPHOS:5,BILITOT:5,PROT:5,ALBUMIN:5 in the last 168 hours No results found for this basename: LIPASE:5,AMYLASE:5 in the last 168 hours No results found for this basename: AMMONIA:5 in the last 168 hours CBC:  Lab 09/08/11 0604 09/07/11 0603 09/06/11 0630 09/05/11 2210 09/05/11 1640  WBC 10.7* 13.3* 15.3* 17.9* 17.2*  NEUTROABS -- 10.6* -- -- --  HGB 9.7* 9.8* 9.9* 10.2* 11.3*  HCT 29.1* 29.4* 30.2* 31.5* 34.9*  MCV 90.9 92.5 93.2 93.5 93.8  PLT 173 159 136* 159 156   Cardiac Enzymes: No results found for this basename: CKTOTAL:5,CKMB:5,CKMBINDEX:5,TROPONINI:5 in the last 168 hours BNP: No components found with this basename: POCBNP:5 CBG: No results found for this basename: GLUCAP:5 in the last 168 hours  Recent Results (from the past 240 hour(s))  CULTURE, BLOOD (ROUTINE X 2)     Status: Normal (Preliminary result)   Collection Time   09/05/11  4:40 PM      Component Value Range Status Comment   Specimen Description BLOOD RIGHT ARM   Final    Special Requests BOTTLES DRAWN AEROBIC AND ANAEROBIC 10CC   Final    Culture  Setup Time 09/05/2011 22:25   Final    Culture     Final    Value:  BLOOD CULTURE RECEIVED NO GROWTH TO DATE CULTURE WILL BE HELD FOR 5 DAYS BEFORE ISSUING A FINAL NEGATIVE REPORT   Report Status PENDING   Incomplete   CULTURE, BLOOD (ROUTINE X 2)     Status: Normal (Preliminary result)   Collection Time   09/05/11  4:51 PM      Component Value Range Status Comment   Specimen Description BLOOD RIGHT HAND   Final    Special Requests BOTTLES DRAWN AEROBIC ONLY [facility]   Final    Culture  Setup Time 09/05/2011 22:25   Final    Culture     Final    Value:        BLOOD CULTURE RECEIVED NO GROWTH TO DATE CULTURE WILL BE HELD FOR 5 DAYS BEFORE ISSUING A FINAL NEGATIVE REPORT   Report Status PENDING   Incomplete   MRSA PCR SCREENING     Status: Normal   Collection Time   09/05/11 10:57 PM      Component Value Range Status Comment   MRSA by PCR NEGATIVE   NEGATIVE Final   CULTURE, EXPECTORATED SPUTUM-ASSESSMENT     Status: Normal   Collection Time   09/06/11  2:30 AM      Component Value Range Status Comment   Specimen Description SPUTUM   Final    Special Requests NONE   Final    Sputum evaluation     Final    Value: THIS SPECIMEN IS ACCEPTABLE. RESPIRATORY CULTURE REPORT TO FOLLOW.   Report Status 09/06/2011 FINAL   Final   CULTURE, RESPIRATORY     Status: Normal   Collection Time   09/06/11  2:30 AM      Component Value Range Status Comment   Specimen Description SPUTUM   Final    Special Requests NONE   Final    Gram Stain     Final    Value: ABUNDANT WBC PRESENT, PREDOMINANTLY PMN     NO SQUAMOUS EPITHELIAL CELLS SEEN     MODERATE GRAM POSITIVE COCCI IN PAIRS     RARE GRAM NEGATIVE COCCOBACILLI   Culture NORMAL OROPHARYNGEAL FLORA   Final    Report Status 09/08/2011 FINAL   Final      Studies: Dg Chest 2 View  09/05/2011  *RADIOLOGY REPORT*  Clinical Data: Chest congestion and fatigue.  Leukocytosis. Shortness of breath.  CHEST - 2 VIEW  Comparison: None.  Findings: Patchy lingular and left lower lobe airspace opacity with obscuration of the left heart borders.  Probable small left pleural effusion.  Clear right lung.  Grossly normal sized heart.  Moderate to large sized hiatal hernia.  Bilateral shoulder degenerative changes.  Thoracolumbar scoliosis and degenerative changes. Cholecystectomy clips.  IMPRESSION:  1.  Lingular and left lower lobe pneumonia. 2.  Probable small left pleural effusion. 3.  Moderate to large sized hiatal hernia.  Clinically significant discrepancy from primary report, if provided: None  Original Report Authenticated By: Darrol Angel, M.D.    Scheduled Meds:    . aspirin  81 mg Oral Daily  . enoxaparin (LOVENOX) injection  40 mg Subcutaneous Q24H  . feeding supplement  237 mL Oral TID WC  . ferrous sulfate  325 mg Oral BID WC  . fesoterodine  8 mg Oral Daily  . loratadine  10 mg Oral Daily  .  moxifloxacin  400 mg Intravenous Once  . moxifloxacin  400 mg Oral Daily  . pantoprazole  40 mg Oral Daily  . potassium chloride  10 mEq Intravenous Q1 Hr x 2  . pregabalin  50 mg Oral TID  . simvastatin  40 mg Oral QHS  . DISCONTD: azithromycin  500 mg Intravenous Q24H  . DISCONTD: cefTRIAXone (ROCEPHIN)  IV  1 g Intravenous Q24H   Continuous Infusions:    . sodium chloride 50 mL/hr at 09/07/11 1753  . DISCONTD: sodium chloride 50 mL/hr at 09/07/11 1521    Lambert Keto, MD  [facility] Pager [phone]  If 7PM-7AM, please contact night-coverage [email or web address] Password [email or web address] 09/08/2011, 2:05 PM

## 2011-09-08 NOTE — Progress Notes (Signed)
SATURATION QUALIFICATIONS:  Patient Saturations on Room Air at Rest = 95%  Patient Saturations on Room Air while Ambulating = 92%  Patient Saturations on n/a of oxygen while Ambulating = N/A

## 2011-09-09 MED ORDER — MOXIFLOXACIN HCL 400 MG PO TABS: 400.0000 mg | ORAL_TABLET | Freq: Every day | ORAL | Status: AC

## 2011-09-09 NOTE — Discharge Summary (Signed)
Physician Discharge Summary  Cathy [name] ZOX:[id number] DOB: April 27, 1927 DOA: 09/05/2011  PCP: Cathy Mcalpine, MD  Admit date: 09/05/2011 Discharge date: 09/09/2011  Discharge Diagnoses:  Principal Problem:  *Community acquired pneumonia Active Problems:  HIATAL HERNIA  DEGENERATIVE JOINT DISEASE  BACK PAIN, LUMBAR  Leukocytosis   Discharge Condition: Stable for discharge   Disposition:  Follow-up Information    Follow up with Cathy M, MD in 3 weeks. (Monday, August 19th at 4:00pm)    Solicitor information:   [employer] [address] [place] [address] [phone]          Diet: Regular diet  History of present illness:  83/F history of Hypertension, dyslipidemia, hiatal hernia, IBS, DJD, osteoporosis was brought to the ER by her family from urgent care. Patient reports onset of cold/sinusitis like symptoms followed by cough and chest congestion over the last week.  This progressively worsened over the last 3 days associated with generalized body aches and pains, she tried some Mucinex at home without much benefit.  Friday her family noted that she was very weak, patient also reported subjective fevers, chills , finally brought to urgent care by her daughter and she was noted to have a temperature of 103 and O2 sats of 77% on room air. Subsequently then had a chest x-ray done which showed left lower lobe pneumonia    Hospital Course:  Principal Problem:  *Community acquired pneumonia: She was admitted to the hospital start on IV Rocephin and azithromycin. By the second day her breathing was better she defervesced she was changed to Avelox and watch overnight. She tolerated this well she will continue this for an additional 4 days. Other medical problems were stable.   Discharge Exam: Filed Vitals:   09/09/11 0600  BP: 167/76  Pulse: 66  Temp: 97.9 F (36.6 C)  Resp: 20   Filed Vitals:   09/08/11 1100 09/08/11 1400 09/08/11  2200 09/09/11 0600  BP:  156/69 158/75 167/76  Pulse:  62 63 66  Temp:  98 F (36.7 C) 99.1 F (37.3 C) 97.9 F (36.6 C)  TempSrc:  Oral Oral Oral  Resp:  16 18 20   Height:      Weight:      SpO2: 93% 94% 94% 93%   General: Alert and oriented times Cardiovascular: Regular rate and rhythm with positive S1-S2 Respiratory: Good air movement clear to auscultation  Discharge Instructions  Discharge Orders    Future Appointments: Provider: Department: Dept Phone: Center:   10/18/2011 4:00 PM Cathy Mcalpine, MD [facility] [phone] None   12/27/2011 3:00 PM Cathy Mcalpine, MD [facility] [phone] None     Future Orders Please Complete By Expires   Diet - low sodium heart healthy      Increase activity slowly        Medication List  As of 09/09/2011  1:25 PM   STOP taking these medications         celecoxib 200 MG capsule         TAKE these medications         aspirin 81 MG tablet   Take 81 mg by mouth daily.      calcium-vitamin D 500-200 MG-UNIT per tablet   Commonly known as: OSCAL WITH D   Take 1 tablet by mouth daily.      esomeprazole 40 MG capsule   Commonly known as: NEXIUM   Take 40 mg by mouth daily before  breakfast.      FEOSOL 200 (65 FE) MG Tabs   Generic drug: Ferrous Sulfate Dried   Take 1 tablet by mouth 2 (two) times daily. Take with a vitamin c 500mg       fexofenadine 180 MG tablet   Commonly known as: ALLEGRA   Take 180 mg by mouth daily. As needed for allergies        FLECTOR 1.3 % Ptch   Generic drug: diclofenac   Place 1 patch onto the skin every 12 (twelve) hours. Apply as directed by Dr. Murray [name]      FORTEO 600 MCG/2.4ML Soln   Generic drug: Teriparatide (Recombinant)   Inject 1 mL into the muscle Daily.      moxifloxacin 400 MG tablet   Commonly known as: AVELOX   Take 1 tablet (400 mg total) by mouth daily.      oxyCODONE-acetaminophen 5-325 MG per tablet   Commonly known as: PERCOCET   Take 1 tablet by mouth every  6 (six) hours as needed.      pregabalin 50 MG capsule   Commonly known as: LYRICA   Take 50 mg by mouth 3 (three) times daily.      simvastatin 40 MG tablet   Commonly known as: ZOCOR   Take 40 mg by mouth at bedtime.      TOVIAZ 8 MG Tb24   Generic drug: fesoterodine   Take 8 mg by mouth daily. Per Dr. Patsi [name]      Vitamin D 1000 UNITS capsule   Take 1,000 Units by mouth daily.              The results of significant diagnostics from this hospitalization (including imaging, microbiology, ancillary and laboratory) are listed below for reference.    Significant Diagnostic Studies: Dg Chest 2 View  09/05/2011  *RADIOLOGY REPORT*  Clinical Data: Chest congestion and fatigue.  Leukocytosis. Shortness of breath.  CHEST - 2 VIEW  Comparison: None.  Findings: Patchy lingular and left lower lobe airspace opacity with obscuration of the left heart borders.  Probable small left pleural effusion.  Clear right lung.  Grossly normal sized heart.  Moderate to large sized hiatal hernia.  Bilateral shoulder degenerative changes.  Thoracolumbar scoliosis and degenerative changes. Cholecystectomy clips.  IMPRESSION:  1.  Lingular and left lower lobe pneumonia. 2.  Probable small left pleural effusion. 3.  Moderate to large sized hiatal hernia.  Clinically significant discrepancy from primary report, if provided: None  Original Report Authenticated By: Darrol Angel, [name].D.    Microbiology: Recent Results (from the past 240 hour(s))  CULTURE, BLOOD (ROUTINE X 2)     Status: Normal (Preliminary result)   Collection Time   09/05/11  4:40 PM      Component Value Range Status Comment   Specimen Description BLOOD RIGHT ARM   Final    Special Requests BOTTLES DRAWN AEROBIC AND ANAEROBIC 10CC   Final    Culture  Setup Time 09/05/2011 22:25   Final    Culture     Final    Value:        BLOOD CULTURE RECEIVED NO GROWTH TO DATE CULTURE WILL BE HELD FOR 5 DAYS BEFORE ISSUING A FINAL NEGATIVE REPORT    Report Status PENDING   Incomplete   CULTURE, BLOOD (ROUTINE X 2)     Status: Normal (Preliminary result)   Collection Time   09/05/11  4:51 PM      Component Value Range Status Comment  Specimen Description BLOOD RIGHT HAND   Final    Special Requests BOTTLES DRAWN AEROBIC ONLY [facility]   Final    Culture  Setup Time 09/05/2011 22:25   Final    Culture     Final    Value:        BLOOD CULTURE RECEIVED NO GROWTH TO DATE CULTURE WILL BE HELD FOR 5 DAYS BEFORE ISSUING A FINAL NEGATIVE REPORT   Report Status PENDING   Incomplete   MRSA PCR SCREENING     Status: Normal   Collection Time   09/05/11 10:57 PM      Component Value Range Status Comment   MRSA by PCR NEGATIVE  NEGATIVE Final   CULTURE, EXPECTORATED SPUTUM-ASSESSMENT     Status: Normal   Collection Time   09/06/11  2:30 AM      Component Value Range Status Comment   Specimen Description SPUTUM   Final    Special Requests NONE   Final    Sputum evaluation     Final    Value: THIS SPECIMEN IS ACCEPTABLE. RESPIRATORY CULTURE REPORT TO FOLLOW.   Report Status 09/06/2011 FINAL   Final   CULTURE, RESPIRATORY     Status: Normal   Collection Time   09/06/11  2:30 AM      Component Value Range Status Comment   Specimen Description SPUTUM   Final    Special Requests NONE   Final    Gram Stain     Final    Value: ABUNDANT WBC PRESENT, PREDOMINANTLY PMN     NO SQUAMOUS EPITHELIAL CELLS SEEN     MODERATE GRAM POSITIVE COCCI IN PAIRS     RARE GRAM NEGATIVE COCCOBACILLI   Culture NORMAL OROPHARYNGEAL FLORA   Final    Report Status 09/08/2011 FINAL   Final      Labs: Basic Metabolic Panel:  Lab 09/08/11 9604 09/07/11 0603 09/06/11 0630 09/05/11 2210 09/05/11 1640  NA 142 141 140 -- 139  K 3.3* 2.8* -- -- --  CL 107 106 105 -- 102  CO2 26 25 26  -- 28  GLUCOSE 100* 95 90 -- 97  BUN 6 11 14  -- 15  CREATININE 0.54 0.55 0.65 0.68 0.79  CALCIUM 8.5 8.4 8.1* -- 8.9  MG -- -- -- -- --  PHOS -- -- -- -- --   Liver Function Tests: No results  found for this basename: AST:5,ALT:5,ALKPHOS:5,BILITOT:5,PROT:5,ALBUMIN:5 in the last 168 hours No results found for this basename: LIPASE:5,AMYLASE:5 in the last 168 hours No results found for this basename: AMMONIA:5 in the last 168 hours CBC:  Lab 09/08/11 0604 09/07/11 0603 09/06/11 0630 09/05/11 2210 09/05/11 1640  WBC 10.7* 13.3* 15.3* 17.9* 17.2*  NEUTROABS -- 10.6* -- -- --  HGB 9.7* 9.8* 9.9* 10.2* 11.3*  HCT 29.1* 29.4* 30.2* 31.5* 34.9*  MCV 90.9 92.5 93.2 93.5 93.8  PLT 173 159 136* 159 156   Cardiac Enzymes: No results found for this basename: CKTOTAL:5,CKMB:5,CKMBINDEX:5,TROPONINI:5 in the last 168 hours BNP: No components found with this basename: POCBNP:5 CBG: No results found for this basename: GLUCAP:5 in the last 168 hours  Time coordinating discharge: Greater than 30 minutes  Signed:  Marinda Elk  [facility] 09/09/2011, 1:25 PM

## 2011-09-09 NOTE — Progress Notes (Signed)
Patient discharge teaching given, including activity, diet, follow-up appoints, and medications. Patient verbalized understanding of all discharge instructions. IV access was d/c'd. Vitals are stable. Skin is intact except as charted in most recent assessments. Pt to be escorted out by NT, to be driven home by family. 

## 2011-09-11 LAB — CULTURE, BLOOD (ROUTINE X 2): Culture: NO GROWTH

## 2011-09-18 ENCOUNTER — Other Ambulatory Visit: Payer: Self-pay | Admitting: Pulmonary Disease

## 2011-10-18 ENCOUNTER — Encounter: Payer: Self-pay | Admitting: Pulmonary Disease

## 2011-10-18 ENCOUNTER — Ambulatory Visit (INDEPENDENT_AMBULATORY_CARE_PROVIDER_SITE_OTHER)
Admission: RE | Admit: 2011-10-18 | Discharge: 2011-10-18 | Disposition: A | Discharge status: Home / Self Care | Payer: Medicare Other | Source: Ambulatory Visit | Attending: Pulmonary Disease | Admitting: Pulmonary Disease

## 2011-10-18 ENCOUNTER — Ambulatory Visit (INDEPENDENT_AMBULATORY_CARE_PROVIDER_SITE_OTHER): Payer: Medicare Other | Admitting: Pulmonary Disease

## 2011-10-18 ENCOUNTER — Other Ambulatory Visit (INDEPENDENT_AMBULATORY_CARE_PROVIDER_SITE_OTHER): Payer: Medicare Other

## 2011-10-18 VITALS — BP 122/68 | HR 64 | Temp 98.0°F | Resp 16 | Ht 59.0 in | Wt 126.0 lb

## 2011-10-18 DIAGNOSIS — K5730 Diverticulosis of large intestine without perforation or abscess without bleeding: Secondary | ICD-10-CM

## 2011-10-18 DIAGNOSIS — J189 Pneumonia, unspecified organism: Secondary | ICD-10-CM

## 2011-10-18 DIAGNOSIS — M545 Low back pain, unspecified: Secondary | ICD-10-CM

## 2011-10-18 DIAGNOSIS — D649 Anemia, unspecified: Secondary | ICD-10-CM

## 2011-10-18 DIAGNOSIS — M1990 Unspecified osteoarthritis, unspecified site: Secondary | ICD-10-CM

## 2011-10-18 DIAGNOSIS — D509 Iron deficiency anemia, unspecified: Secondary | ICD-10-CM

## 2011-10-18 DIAGNOSIS — R1319 Other dysphagia: Secondary | ICD-10-CM

## 2011-10-18 DIAGNOSIS — I10 Essential (primary) hypertension: Secondary | ICD-10-CM

## 2011-10-18 DIAGNOSIS — K589 Irritable bowel syndrome without diarrhea: Secondary | ICD-10-CM

## 2011-10-18 DIAGNOSIS — E538 Deficiency of other specified B group vitamins: Secondary | ICD-10-CM

## 2011-10-18 DIAGNOSIS — E7800 Pure hypercholesterolemia, unspecified: Secondary | ICD-10-CM

## 2011-10-18 DIAGNOSIS — I872 Venous insufficiency (chronic) (peripheral): Secondary | ICD-10-CM

## 2011-10-18 DIAGNOSIS — K449 Diaphragmatic hernia without obstruction or gangrene: Secondary | ICD-10-CM

## 2011-10-18 DIAGNOSIS — M949 Disorder of cartilage, unspecified: Secondary | ICD-10-CM

## 2011-10-18 DIAGNOSIS — M899 Disorder of bone, unspecified: Secondary | ICD-10-CM

## 2011-10-18 LAB — CBC WITH DIFFERENTIAL/PLATELET
Eosinophils Relative: 0.9 % (ref 0.0–5.0)
HCT: 34.1 % — ABNORMAL LOW (ref 36.0–46.0)
Lymphocytes Relative: 21.4 % (ref 12.0–46.0)
Lymphs Abs: 1.3 10*3/uL (ref 0.7–4.0)
Monocytes Relative: 10.2 % (ref 3.0–12.0)
Neutrophils Relative %: 67.3 % (ref 43.0–77.0)
Platelets: 179 10*3/uL (ref 150.0–400.0)
WBC: 6 10*3/uL (ref 4.5–10.5)

## 2011-10-18 LAB — IBC PANEL
Iron: 63 ug/dL (ref 42–145)
Saturation Ratios: 26.3 % (ref 20.0–50.0)
Transferrin: 171 mg/dL — ABNORMAL LOW (ref 212.0–360.0)

## 2011-10-18 LAB — BASIC METABOLIC PANEL
BUN: 9 mg/dL (ref 6–23)
Calcium: 9.6 mg/dL (ref 8.4–10.5)
GFR: 112.47 mL/min (ref >60.00)
Potassium: 3.8 mEq/L (ref 3.5–5.1)

## 2011-10-18 NOTE — Progress Notes (Signed)
Subjective:    Patient ID: Cathy [name], female    DOB: 24-May-1927, 78 y.o.   MRN: [id number]  HPI 78 y/o WF here for a follow up visit... she has multiple medical problems including HBP, Hyperchol, HH/ Divertics/ IBS followed by DrDBrodie; hx UTIs/ bladder problems followed by DrTannenbaum; DJD/ LBP/ Osteoporosis followed by DrBeane for Ortho; chronic pain syndrome managed by DrBartko; Anxiety, mild Anemia...  ~  April 21, 2010:  she had right TKR 1/12 by DrBeane w/ "rough" post op course- in [place] [facility]) for rehab; she wants to go back on Celebrex & we will write this rx, but she is warned to watch for GI problems & we will monitor renal function etc; she will continue rehab from [facility] & pain management from [facility]...    She notes a mild URI w/ scratchy throat & drainage> rec to use OTC Mucinex, fluids, & Chlorseptic spray vs lozenges;  BP remains stable on diet rx alone;  Chol looks good on Simva40;  GI stable on Nexium daily;  she reports still getting accupuncture for her bladder problems & now on Toviaz as well & improved...    Labs today reveal mild anemia Hg=11.8 w/ low Fe=20 (8%sat) and B12 deficient=139;  she is rec to increase her FeSO4 to Bid w/ VitC 500, and start on Vit B12 shots monthly...  ~  August 21, 2010:  26mo ROV & she is c/o arthritis pain all over, all the time> she requests Rheum consultation w/ DrDeveshwar...  She saw [facility] w/ another shot in her back 4/12 but no benefit she says; given OXYCODONE & she takes 3/d, plus NEURONTIN Qhs "for my back & legs burning", & the CELEBREX she requested last OV...  Medically stable>  BP, Chol, GI, & GU are all at baseline...  ~  December 23, 2010:  26mo ROV & she states that she is improved- feeling better, less discomfort, etc;  She had Rheum consult w/ DrDeveshwar 9/12 for her "pain all over" back surg DrJones 2010, siatica, injured thumb in door, aches & pains all over> found to have OA, Rt TKR, scoliosis, lumbar disc dis w/  fusion, vertebral compression, osteoporosis, pain management DrBartko> recs were to continue pain management DrBartko & consider Forteo Rx...    HBP> not on meds; diet controlled- BP= 138/88 and she denies CP, palpit, syncope, ch in SOB/DOE, edema, etc...    CHOL> on Simva40 w/ good control; prev FLP showed all parameters at goal & LDL 82...    LargeHH> on Nexium40 & stable w/o abd pain, n/v, dysphagia, etc...    Divertics/ IBS/ Colitis> treated 2011 for lymphocytic colitis w/ entocort & resolved...    GU> incont & bladder prob treated by DrTannenbaum; on Toviaz8 now but states she prev received a shot in her ankle but Medicare wouldn't pay...    DJD/ Back Pain, Chronic Pain> see above per DrDeveshwar & DrBartko> on Neurontin 100Bid+300Qhs, Percocet5 Q6H, Celebrex200, Flector, etc...    Osteoporosis> on Fosamax, Calcium, MVI, VitD; DrDeveshwar wanted to consider Forteo...    Anemia> on FeSO4 Bid w/ VitC and B12 orally every day (?if she ever started this)...  ~  April 28, 2011:  26mo ROV & she continues to c/o pain all over & has mult meds per DrDeveshwar & Bartko> Percocet, Celebrex, Flector, Neurontin, & now on Forteo shots daily (started 12/12); she has difficulty standing, walking, etc...    She saw TP 1/13 w/ bronchitis> Rx Zpak & Pred, now improved  just chr weak/ tired/ feeling bad/ mult somatic complaints...    BP now stable on diet alone & measures 122/70 today;  Chol looks good on Simva40;  GI symptoms stable on Nexium...    Fasting labs today all looked good> see below...  ~  June 26,2013:  257mo ROV & she reports that DrDeveshwar changed Neurontin to [facility] & this has helped her leg burning pain tremendously; unfortunatelt she still requires the Percocet per DrBartko 3 times daily due to her back;  She requests Rx for shingles vaccine because MaryJohnson told her you definitely don't want to get this!    HBP, borderline> not on meds; diet controlled- BP= 128/64 and she denies CP,  palpit, syncope, ch in SOB/DOE, edema, etc...    CHOL> on Simva40 w/ good control; prev FLP showed all parameters at goal & LDL 59...    LargeHH> on Nexium40 & stable w/o abd pain, n/v, dysphagia, etc...    Divertics/ IBS/ Colitis> treated 2011 for lymphocytic colitis w/ entocort & resolved...    GU> incont & bladder prob treated by DrTannenbaum; on Toviaz8 now but states she prev received a shock in her ankle but Medicare wouldn't pay...    DJD/ Back Pain, Chronic Pain> see above per DrDeveshwar & DrBartko> on Lyrica50Tid now, Percocet5 Q6H, Celebrex200, Flector, etc...    Osteoporosis> on FORTEO, Calcium, MVI, VitD; DrDeveshwar is in charge of this Rx...    Anemia, B12 Defic> on FeSO4 Bid w/ VitC and she repleted her B12 orally but has apparently stopped this once again... We reviewed prob list, meds, xrays and labs> see below>>  ~  October 18, 2011:  57mo ROV & post hospital check> She was Adm 7/7 - 09/09/11 w/ LLL Pneumonia (CAP); [place] & exposed to a granddaughter w/ cough; after ret to [place] she felt bad, Temp to 103, min cough, no sput, no CP, min SOB, & denies choking/ reflux/ dysphagia/ etc;  CXR revealed LLL & lingular opacity, large HH, clear on right; WBC was 17K w/ left shift, & Pneumococcal Ag was neg w/ Sput cult= NTF only; she was treated w/ Rochephin & Zithromax and improved; disch on Avelox; since disch she has improved to baseline...    Since disch her BP has remained stable on diet alone; she denies abd pain, n/v, choking/reflux, c/d, blood seen; she remains on Oxycodone for pain per [facility] & she is still receiving her Forteo Rx...    We reviewed prob list, meds, xrays and labs> see below for updates >> CXR 8/13 showed mild cardiomeg, mild basilar atx w/o infiltrates, mod HH, kyphosis & DJD in TSpine... LABS 8/13:  Chems- wnl;  CBC- hg=11.4, wbc=6K, Fe=63 (26%sat);  B12=1500   Problem List:  ALLERGY (ICD-995.3) - she uses ALLEGRA 180mg  daily and wants to  continue w/ this therapy...  PNEUMONIA >>  ~  CXR 2/11 showed normal heart size, sl decr lung volumes but clear, large HH seen, DJD in spine... ~  7/13: Adm w/ LLL Pneumonia> CXR showed LLL & lingular opacities, large HH, DJD in spine... ~  CXR 8/13 showed mild cardiomeg, mild basilar atx w/o infiltrates, mod HH, kyphosis & DJD in TSpine...  HYPERTENSION (ICD-401.9) - on ASA 81mg /d, off Lasix & controlled w/ diet alone, but she notes- "I love salt" (advised restriction), not checking BP's at home, etc...   ~  6/13:  BP= 128/64 & she denies CP, palpit, SOB, edema... ~  8/13:  BP= 122/68 &  she remains largely asymptomatic...  HYPERCHOLESTEROLEMIA (ICD-272.0) - on SIMVASTATIN 40mg /d now... ~  FLP 4/08 showed TChol 218, TG 86, HDL 77, LDL 117 ~  FLP 2/09 showed TChol 198, TG 48, HDL 73, LDL 116 ~  3/10- she ret on diet alone- FLP w/ TChol 263, TG 65, HDL 81, LDL 158... rec> start Simva40. ~  9/10: needs FLP but not fasting today... LFTs are WNL. ~  3/11: reminded of need for f/u FLP on the Simva40. ~  FLP 6/11 on Simva40 showed TChol 189, TG 76, HDL 77, LDL 97... same Rx. ~  FLP 2/12 on Simva40 showed TChol 160, TG 61, HDL 66, LDL 82 ~  FLP 2/13 on Simva40 showed TChol 145, TG 31, HDL 80, LDL 59  HIATAL HERNIA (ICD-553.3) - on NEXIUM 40mg /d... EGD 11/03 by DrDBrodie showed 5cm HH, & esoph stricture dilated... developed dysphagia  5/11 w/ repeat EGD by DrDBrodie> large HH, dilated & improved (may need HH surg)...   ~  CT Abd 2/11 showed large HH; bilat sm effusions & atx; DJD & post op changes in spine; redundant colon & few divertics; increased size of mult hypodense splenic masses & left adrenal mass ?etiology... ~  Ba swallow 2/11 showed large HH, presbyesoph... ~  She denies nausea, vomiting, heartburn, diarrhea, constipation, blood in stool, abdominal pain, swelling, gas...  DIVERTICULOSIS OF COLON (ICD-562.10) & IRRITABLE BOWEL SYNDROME (ICD-564.1) -  ~  last colonoscopy was 11/03 by  DrDBrodie & showed divertics only... f/u 52yrs. ~  9/10:  she notes some constipation & Questran discontinued. ~  2/11:  FlexSig in hosp showed lymphocytic colitis- Rx'd Entocort & resolved...  S/P CHOLECYSTECTOMY for Stones 2/11 by DrHoxsworth...  UTIs & URINARY INCONTINENCE >> ~  prev evals by GYN DrMcPhail, & Urology DrTannenbaum; she has frequency, urge & stress incontinence, and a hypotonic bladder- s/p pubovag sling surg for incont... they tried Bethanachol (she stopped this med) & Gelnique (she states didn't help)... ~  Urology f/u by DrTannenbaum 12/10 reviewed & stable w/ phys therapy. ~  Subseq started TOVIAZ 8mg /d & improved, she says...  HYSTERECTOMY, HX OF (ICD-V45.77) - her last GYN was DrMcPhail before he retired...  DEGENERATIVE JOINT DISEASE (ICD-715.90) - on CELEBREX (uses it Prn)... followed by DrBeane/Olin for Ortho, DrDeveshwar for Rheum, & DrJones for Neurosurg... she has had right knee injections followed by right TKR 1/12 per DrBeane.  BACK PAIN, LUMBAR (ICD-724.2) - she has chronic kyphoscoliosis... DrBeane has evaluated and Rx w/ shots, Pred, pain meds... she has seen DrRamos & had selective L5 nerve root block (without benefit)... second opinion from DrDJones, Neurosurg w/ lumbar laminectomy 5/10 providing temp relief... Now followed by [facility] for PAIN MANAGEMENT... ~  9/10: c/o recurrent LBP w/ MRI per DrJones and epid shot planned per DrBartko... now on FLECTOR Prn, LYRICA 50mg Tid, PERCOCET up to 3 per day... ~  12/10: note from [facility] reviewed- pain management. ~  5/11:  seen by [facility] for another shot, Vicodin changed to OXYCODONE. ~  She has been seen by DrDeveshwar for Rheum & she rec continued pain management by DrBartko...  OSTEOPENIA (ICD-733.90) - she takes Ca++, MVI, Vit D; prev on Alendronte & DrDeveshwar changed her to [facility] 12/12...  ~  BMD 6/08 at [facility] & showed TScores from -2.0 to -2.7, sl better than in 2005... ~  Vit D level 2/09 was 30  (30-90) and rec to take Vit D 1000 u daily supplement... ~  BMD 10/12 at [facility] showed TScore at lowest  measured site= -4.3 in left forearm... ~  12/12:  DrDeveshwar Rx w/ FORTEO starting 12/12... ~  Labs 2/13 showed Vit D level = 32  ANXIETY (ICD-300.00) - she declines anxiolytic Rx despite her anxiety...  ANEMIA OF CHRONIC DISEASE (ICD-285.29) -  taking FEOSOL 200mg  Bid w/ VitC now & added Vit B12 starting 2/12. ~  labs 2/09 showed Hg= 10.4 & MCV= 90... ~  labs 3/10 showed Hg= 12.9 ~  labs 9/10 showed Hg= 12.0, Fe= 89 ~  labs 3/11 showed Hg= 10.7, Fe= 55, TIBC= 119, Sat=35%. ~  labs 6/11 showed Hg= 11.8, Fe= 100 (37%sat). ~  Labs 2/12 showed Hg= 11.8, Fe= 20 (8%sat), Vit B12= 139 ~  Labs 2/13 showed Hg= 12.1, Fe= 109 (45%sat), B12= 1373 ~  Labs 7/13 in hosp showed Hg= 11.3=>9.7 ~  Labs 8/13 showed Hg= 11.4, Fe=63 (26%sat), B12=1500  VITAMIN B-12 DEFICIENCY>  She was supposed to have started on oral Vit B12 supplements 2/12... ~  Labs 2/12 showed Vit B12 level = 139... rec to start oral B12 supplement ~1041mcg daily... ~  2013:  Noted that pt stopped the B12 supplement on her own...   Past Surgical History  Procedure Date  . Cataract surgery   . Abdominal hysterectomy   . Decompressive lumbar laminectomy 06/2008    at L2-3, L3-4 and L4-5 by Dr. Yetta Barre  . Laparoscopic cholecystectomy 04/2009    Dr. Odie Sera    Outpatient Encounter Prescriptions as of 10/18/2011  Medication Sig Dispense Refill  . aspirin 81 MG tablet Take 81 mg by mouth daily.        . calcium-vitamin D (OSCAL WITH D) 500-200 MG-UNIT per tablet Take 1 tablet by mouth daily.       . Cholecalciferol (VITAMIN D) 1000 UNITS capsule Take 1,000 Units by mouth daily.        . diclofenac (FLECTOR) 1.3 % PTCH Place 1 patch onto the skin every 12 (twelve) hours. Apply as directed by Dr. Murray Hodgkins      . esomeprazole (NEXIUM) 40 MG capsule Take 40 mg by mouth daily before breakfast.      . Ferrous Sulfate Dried (FEOSOL) 200 (65  FE) MG TABS Take 1 tablet by mouth 2 (two) times daily. Take with a vitamin c 500mg        . Fesoterodine Fumarate (TOVIAZ) 8 MG TB24 Take 8 mg by mouth daily. Per Dr. Patsi Sears       . fexofenadine (ALLEGRA) 180 MG tablet Take 180 mg by mouth daily. As needed for allergies        . FORTEO 600 MCG/2.4ML SOLN Inject 1 mL into the muscle Daily.       [name] oxyCODONE-acetaminophen (PERCOCET) 5-325 MG per tablet Take 1 tablet by mouth every 6 (six) hours as needed.       . pregabalin (LYRICA) 50 MG capsule Take 50 mg by mouth 3 (three) times daily.      . simvastatin (ZOCOR) 40 MG tablet Take 40 mg by mouth at bedtime.      . CELEBREX 200 MG capsule TAKE 1 CAPSULE DAILY AS NEEDED FOR JOINT PAIN  90 capsule  2    Allergies  Allergen Reactions  . Amoxicillin-Pot Clavulanate     REACTION: diarrhea  . Sulfonamide Derivatives     REACTION: unsure of reaction    Current Medications, Allergies, Past Medical History, Past Surgical History, Family History, and Social History were reviewed in [employer] record.    Review of  Systems        See HPI - all other systems neg except as noted...       The patient complains of decreased hearing, dyspnea on exertion, incontinence, arthritis all over & difficulty walking.  The patient denies anorexia, fever, weight loss, weight gain, vision loss, hoarseness, chest pain, syncope, peripheral edema, prolonged cough, headaches, hemoptysis, abdominal pain, melena, hematochezia, severe indigestion/heartburn, hematuria, muscle weakness, suspicious skin lesions, transient blindness, depression, unusual weight change, abnormal bleeding, enlarged lymph nodes, and angioedema.     Objective:   Physical Exam     WD, WN, 78 y/o WF in NAD... GENERAL:  Alert & oriented; pleasant & cooperative... HEENT:  [place]/AT, EOM-full, PERRLA, EACs-clear, TMs-wnl, NOSE-clear, THROAT-clear & wnl. NECK:  Supple w/ fairROM; no JVD; normal carotid impulses w/o bruits; no  thyromegaly or nodules palpated; no lymphadenopathy. CHEST:  Clear to P & A; without wheezes/ rales/ or rhonchi heard... HEART:  Regular Rhythm; without murmurs/ rubs/ or gallops detected... ABDOMEN:  Soft & sl tenderness, normal bowel sounds; no organomegaly or masses palpated...no guarding or rebound.  BACK:  s/p lumbar lam & scoliosis/ kyphosis EXT: without deformities, mod arthritic changes; no varicose veins/ +venous insuffic/ tr-1+ edema... NEURO:  CN's intact; no focal neuro deficits... DERM:  No lesions noted; no rash etc...  RADIOLOGY DATA:  Reviewed in the EPIC EMR & discussed w/ the patient...  LABORATORY DATA:  Reviewed in the EPIC EMR & discussed w/ the patient...   Assessment & Plan:    PNEUMONIA>  Adm 7/13 w/ CAP in LLL area, neg cults & treated w/ Roceph/Zithro IV, then Avelox orally; clinically improved & f/u CXR   HBP>  Remains controlled on diet alone...  Ven Insuffic>  Reminded to avoid sodium, elev legs, support hose..  CHOL>  On Simva40 + diet;  F/u FLP looked good...  GI>  HH/ Dysphagia/ Divertics/ IBS/ Lymphocytic colitis>  Followed by DrDBrodie & stable, continue same...  GU>  UTIs/ Incontinence>  Managed by DrTannenbaum on Toviaz8...  DJD/ LBP/ Chronic Pain Syndrome>  This is her CC & main prob; she was referred to Rheum DrDeveshwar; already seeing Dr Murray Hodgkins for Pain Management, DrOlin & DrBeane for Ortho;  See meds above & continue same> she notes that Lyrica helps leg burning discomfort but still need 3 Percocet/d from [facility] due to back pain...  Anxiety>  She declines anxiolytic meds...  ANEMIA>  Hx Fe & B12 deficient>  On oral supplements her CBC, Fe, B12 are all better...   Patient's Medications  New Prescriptions   No medications on file  Previous Medications   ASPIRIN 81 MG TABLET    Take 81 mg by mouth daily.     CALCIUM-VITAMIN D (OSCAL WITH D) 500-200 MG-UNIT PER TABLET    Take 1 tablet by mouth daily.    CELEBREX 200 MG CAPSULE    TAKE  1 CAPSULE DAILY AS NEEDED FOR JOINT PAIN   CHOLECALCIFEROL (VITAMIN D) 1000 UNITS CAPSULE    Take 1,000 Units by mouth daily.     DICLOFENAC (FLECTOR) 1.3 % PTCH    Place 1 patch onto the skin every 12 (twelve) hours. Apply as directed by Dr. Murray Hodgkins   ESOMEPRAZOLE (NEXIUM) 40 MG CAPSULE    Take 40 mg by mouth daily before breakfast.   FERROUS SULFATE DRIED (FEOSOL) 200 (65 FE) MG TABS    Take 1 tablet by mouth 2 (two) times daily. Take with a vitamin c 500mg     FESOTERODINE FUMARATE (TOVIAZ)  8 MG TB24    Take 8 mg by mouth daily. Per Dr. Patsi Sears    FEXOFENADINE (ALLEGRA) 180 MG TABLET    Take 180 mg by mouth daily. As needed for allergies     FORTEO 600 MCG/2.4ML SOLN    Inject 1 mL into the muscle Daily.    OXYCODONE-ACETAMINOPHEN (PERCOCET) 5-325 MG PER TABLET    Take 1 tablet by mouth every 6 (six) hours as needed.    PREGABALIN (LYRICA) 50 MG CAPSULE    Take 50 mg by mouth 3 (three) times daily.   SIMVASTATIN (ZOCOR) 40 MG TABLET    Take 40 mg by mouth at bedtime.  Modified Medications   No medications on file  Discontinued Medications   No medications on file

## 2011-10-18 NOTE — Patient Instructions (Addendum)
Today we updated your med list in our EPIC system...    Continue your current medications the same...  Today we did your follow up CXR & blood work...    We will call you w/ the results...  Keep your previously planned f/u appt on 12/27/11 at 3PM..Cathy [name]

## 2011-10-19 LAB — VITAMIN B12: Vitamin B-12: >1500 pg/mL — ABNORMAL HIGH (ref 211–911)

## 2011-11-10 ENCOUNTER — Telehealth: Payer: Self-pay | Admitting: Pulmonary Disease

## 2011-11-10 MED ORDER — LEVOFLOXACIN 500 MG PO TABS: 500.0000 mg | ORAL_TABLET | Freq: Every day | ORAL | Status: AC

## 2011-11-10 NOTE — Telephone Encounter (Signed)
Pt aware Rx's and OTC meds from SN-sent to [facility] at [facility] [employer]. [employer].

## 2011-11-10 NOTE — Telephone Encounter (Signed)
Per SN---ok to call in levaquin 500 mg  # 7  1 daily until gone, mucinex otc 2 po bid with plenty of fluids, align once daily and use the nasal saline spray as needed.

## 2011-11-10 NOTE — Telephone Encounter (Signed)
I spoke with pt and she c/o cough w/ yellow phlem, chills, HA, blowing out yellow phlam x couple days. Had temp this AM of 99.0 but now is 98.7. Pt is requesting to have something called in for her. Please advise Dr. Kriste Basque thanks  Allergies  Allergen Reactions  . Amoxicillin-Pot Clavulanate     REACTION: diarrhea  . Sulfonamide Derivatives     REACTION: unsure of reaction

## 2011-11-12 ENCOUNTER — Telehealth: Payer: Self-pay | Admitting: Pulmonary Disease

## 2011-11-12 NOTE — Telephone Encounter (Signed)
Spoke with pt. She states that she is needing to know name of med that the was told to take with her abx. I advised that it;s called align and she can get this otc. She verbalized understanding and states nothing further needed.

## 2011-11-15 ENCOUNTER — Telehealth: Payer: Self-pay | Admitting: Pulmonary Disease

## 2011-11-15 NOTE — Telephone Encounter (Signed)
Spoke with pt. She is requesting recs on who SN would rec for her son with sciatic nerve. She states that he used to be SN's pt, and is not sure who his PCP his now, but "has the most confidence in Dr. Kriste Basque". Please advise thanks!

## 2011-11-16 NOTE — Telephone Encounter (Signed)
Per SN, he should be seen by Ortho and ask for someone who specializes in back disorders. Pt aware and verbalized understanding.

## 2011-12-19 ENCOUNTER — Other Ambulatory Visit: Payer: Self-pay | Admitting: Pulmonary Disease

## 2011-12-22 ENCOUNTER — Encounter: Payer: Self-pay | Admitting: *Deleted

## 2011-12-27 ENCOUNTER — Encounter: Payer: Self-pay | Admitting: Pulmonary Disease

## 2011-12-27 ENCOUNTER — Ambulatory Visit (INDEPENDENT_AMBULATORY_CARE_PROVIDER_SITE_OTHER): Payer: Medicare Other | Admitting: Pulmonary Disease

## 2011-12-27 ENCOUNTER — Other Ambulatory Visit (INDEPENDENT_AMBULATORY_CARE_PROVIDER_SITE_OTHER): Payer: Medicare Other

## 2011-12-27 VITALS — BP 126/68 | HR 64 | Temp 97.9°F | Ht 59.0 in | Wt 130.2 lb

## 2011-12-27 DIAGNOSIS — M810 Age-related osteoporosis without current pathological fracture: Secondary | ICD-10-CM

## 2011-12-27 DIAGNOSIS — M545 Low back pain, unspecified: Secondary | ICD-10-CM

## 2011-12-27 DIAGNOSIS — K449 Diaphragmatic hernia without obstruction or gangrene: Secondary | ICD-10-CM

## 2011-12-27 DIAGNOSIS — K589 Irritable bowel syndrome without diarrhea: Secondary | ICD-10-CM

## 2011-12-27 DIAGNOSIS — I10 Essential (primary) hypertension: Secondary | ICD-10-CM

## 2011-12-27 DIAGNOSIS — F411 Generalized anxiety disorder: Secondary | ICD-10-CM

## 2011-12-27 DIAGNOSIS — D649 Anemia, unspecified: Secondary | ICD-10-CM

## 2011-12-27 DIAGNOSIS — N3281 Overactive bladder: Secondary | ICD-10-CM

## 2011-12-27 DIAGNOSIS — I872 Venous insufficiency (chronic) (peripheral): Secondary | ICD-10-CM

## 2011-12-27 DIAGNOSIS — N390 Urinary tract infection, site not specified: Secondary | ICD-10-CM

## 2011-12-27 DIAGNOSIS — M1990 Unspecified osteoarthritis, unspecified site: Secondary | ICD-10-CM

## 2011-12-27 DIAGNOSIS — E7800 Pure hypercholesterolemia, unspecified: Secondary | ICD-10-CM

## 2011-12-27 DIAGNOSIS — K5730 Diverticulosis of large intestine without perforation or abscess without bleeding: Secondary | ICD-10-CM

## 2011-12-27 DIAGNOSIS — N318 Other neuromuscular dysfunction of bladder: Secondary | ICD-10-CM

## 2011-12-27 LAB — URINALYSIS, ROUTINE W REFLEX MICROSCOPIC
Bilirubin Urine: NEGATIVE
Ketones, ur: NEGATIVE
Total Protein, Urine: NEGATIVE
Urine Glucose: NEGATIVE
pH: 6 (ref 5.0–8.0)

## 2011-12-27 MED ORDER — CIPROFLOXACIN HCL 250 MG PO TABS: 250.0000 mg | ORAL_TABLET | Freq: Two times a day (BID) | ORAL | Status: DC

## 2011-12-27 NOTE — Patient Instructions (Addendum)
Today we updated your med list in our EPIC system...    Continue your current medications the same...  Today we obtained a urine specimen for analysis & culture...    Start the Cipro one tab twice daily...    Drink lots of fluids and cranberry juice...    Take an OTC PROBIOTIC like Align or [facility] colon health- one daily...  We may need to refer you to DrTannenbaum for further evaluation...  Let's plan a follow up visit in 3-4 months.Marland KitchenMarland Kitchen

## 2011-12-28 LAB — URINE CULTURE: Colony Count: 10000

## 2011-12-30 ENCOUNTER — Telehealth: Payer: Self-pay | Admitting: Pulmonary Disease

## 2011-12-30 NOTE — Telephone Encounter (Signed)
Called and spoke with patient informed her of results/recs as listed below per Dr. Kriste Basque.  Verbalized understanding of this and nothing further needed at this time.  Result Note     Please notify patient>    UA is clear w/o signs of infection, and C&S is w/o pathogen identified; Rec Cranberry juice & water.Marland KitchenMarland Kitchen

## 2011-12-30 NOTE — Progress Notes (Signed)
Quick Note:  Called and spoke with patient and informed her of results per Dr. Kriste Basque as listed below. Verbalized understanding of this and nothing further needed at this time. ______

## 2012-01-02 ENCOUNTER — Other Ambulatory Visit: Payer: Self-pay | Admitting: Pulmonary Disease

## 2012-01-08 ENCOUNTER — Encounter: Payer: Self-pay | Admitting: Pulmonary Disease

## 2012-01-08 NOTE — Progress Notes (Signed)
Subjective:    Patient ID: Cathy [name], female    DOB: 24-May-1927, 78 y.o.   MRN: [id number]  HPI 78 y/o WF here for a follow up visit... she has multiple medical problems including HBP, Hyperchol, HH/ Divertics/ IBS followed by DrDBrodie; hx UTIs/ bladder problems followed by DrTannenbaum; DJD/ LBP/ Osteoporosis followed by DrBeane for Ortho; chronic pain syndrome managed by DrBartko; Anxiety, mild Anemia...  ~  April 21, 2010:  she had right TKR 1/12 by DrBeane w/ "rough" post op course- in [place] [facility]) for rehab; she wants to go back on Celebrex & we will write this rx, but she is warned to watch for GI problems & we will monitor renal function etc; she will continue rehab from [facility] & pain management from [facility]...    She notes a mild URI w/ scratchy throat & drainage> rec to use OTC Mucinex, fluids, & Chlorseptic spray vs lozenges;  BP remains stable on diet rx alone;  Chol looks good on Simva40;  GI stable on Nexium daily;  she reports still getting accupuncture for her bladder problems & now on Toviaz as well & improved...    Labs today reveal mild anemia Hg=11.8 w/ low Fe=20 (8%sat) and B12 deficient=139;  she is rec to increase her FeSO4 to Bid w/ VitC 500, and start on Vit B12 shots monthly...  ~  August 21, 2010:  26mo ROV & she is c/o arthritis pain all over, all the time> she requests Rheum consultation w/ DrDeveshwar...  She saw [facility] w/ another shot in her back 4/12 but no benefit she says; given OXYCODONE & she takes 3/d, plus NEURONTIN Qhs "for my back & legs burning", & the CELEBREX she requested last OV...  Medically stable>  BP, Chol, GI, & GU are all at baseline...  ~  December 23, 2010:  26mo ROV & she states that she is improved- feeling better, less discomfort, etc;  She had Rheum consult w/ DrDeveshwar 9/12 for her "pain all over" back surg DrJones 2010, siatica, injured thumb in door, aches & pains all over> found to have OA, Rt TKR, scoliosis, lumbar disc dis w/  fusion, vertebral compression, osteoporosis, pain management DrBartko> recs were to continue pain management DrBartko & consider Forteo Rx...    HBP> not on meds; diet controlled- BP= 138/88 and she denies CP, palpit, syncope, ch in SOB/DOE, edema, etc...    CHOL> on Simva40 w/ good control; prev FLP showed all parameters at goal & LDL 82...    LargeHH> on Nexium40 & stable w/o abd pain, n/v, dysphagia, etc...    Divertics/ IBS/ Colitis> treated 2011 for lymphocytic colitis w/ entocort & resolved...    GU> incont & bladder prob treated by DrTannenbaum; on Toviaz8 now but states she prev received a shot in her ankle but Medicare wouldn't pay...    DJD/ Back Pain, Chronic Pain> see above per DrDeveshwar & DrBartko> on Neurontin 100Bid+300Qhs, Percocet5 Q6H, Celebrex200, Flector, etc...    Osteoporosis> on Fosamax, Calcium, MVI, VitD; DrDeveshwar wanted to consider Forteo...    Anemia> on FeSO4 Bid w/ VitC and B12 orally every day (?if she ever started this)...  ~  April 28, 2011:  26mo ROV & she continues to c/o pain all over & has mult meds per DrDeveshwar & Bartko> Percocet, Celebrex, Flector, Neurontin, & now on Forteo shots daily (started 12/12); she has difficulty standing, walking, etc...    She saw TP 1/13 w/ bronchitis> Rx Zpak & Pred, now improved  just chr weak/ tired/ feeling bad/ mult somatic complaints...    BP now stable on diet alone & measures 122/70 today;  Chol looks good on Simva40;  GI symptoms stable on Nexium...    Fasting labs today all looked good> see below...  ~  June 26,2013:  19mo ROV & she reports that DrDeveshwar changed Neurontin to [facility] & this has helped her leg burning pain tremendously; unfortunatelt she still requires the Percocet per DrBartko 3 times daily due to her back;  She requests Rx for shingles vaccine because Mary[facility] told her you definitely don't want to get this!    HBP, borderline> not on meds; diet controlled- BP= 128/64 and she denies CP,  palpit, syncope, ch in SOB/DOE, edema, etc...    CHOL> on Simva40 w/ good control; prev FLP showed all parameters at goal & LDL 59...    LargeHH> on Nexium40 & stable w/o abd pain, n/v, dysphagia, etc...    Divertics/ IBS/ Colitis> treated 2011 for lymphocytic colitis w/ entocort & resolved...    GU> incont & bladder prob treated by DrTannenbaum; on Toviaz8 now but states she prev received a shock in her ankle but Medicare wouldn't pay...    DJD/ Back Pain, Chronic Pain> see above per DrDeveshwar & DrBartko> on Lyrica50Tid now, Percocet5 Q6H, Celebrex200, Flector, etc...    Osteoporosis> on FORTEO, Calcium, MVI, VitD; DrDeveshwar is in charge of this Rx...    Anemia, B12 Defic> on FeSO4 Bid w/ VitC and she repleted her B12 orally but has apparently stopped this once again... We reviewed prob list, meds, xrays and labs> see below>>  ~  October 18, 2011:  76mo ROV & post hospital check> She was Adm 7/7 - 09/09/11 w/ LLL Pneumonia (CAP); [place] & exposed to a granddaughter w/ cough; after ret to [place] she felt bad, Temp to 103, min cough, no sput, no CP, min SOB, & denies choking/ reflux/ dysphagia/ etc;  CXR revealed LLL & lingular opacity, large HH, clear on right; WBC was 17K w/ left shift, & Pneumococcal Ag was neg w/ Sput cult= NTF only; she was treated w/ Rochephin & Zithromax and improved; disch on Avelox; since disch she has improved to baseline...    Since disch her BP has remained stable on diet alone; she denies abd pain, n/v, choking/reflux, c/d, blood seen; she remains on Oxycodone for pain per [facility] & she is still receiving her Forteo Rx...    We reviewed prob list, meds, xrays and labs> see below for updates >> CXR 8/13 showed mild cardiomeg, mild basilar atx w/o infiltrates, mod HH, kyphosis & DJD in TSpine... LABS 8/13:  Chems- wnl;  CBC- hg=11.4, wbc=6K, Fe=63 (26%sat);  B12=1500   ~  December 27, 2011:  76mo ROV & Cathy [name] is c/o urinary symptoms- ?dysuria, feels  like she's not emptying, painful discomfort, incr freq, sm volumes, but no f/c/s/ etc; recall that she is on Toviaz8 for OB symptoms;  Exam is neg w/o tenderness over bladder & no CVAT;  We decided eval w/ UA (essent clear) & cult (neg- 10k mult spec), & treat w/ Cipro250 & fluids...    Prev pneumonia resolved, CXR 8/13 was clear;  BP controlled on diet= 126/68 today;  Chol has been good on Simva40 (see labs below);  She still sees DrDeveshwar for Rheum> DJD, LBP, osteoporosis on Percocet, Lyrica, Forteo shots...     We reviewed prob list, meds, xrays and labs> see below for updates >>  LABS 10/13:  UA- essentially clear;  C&S- neg w/ mult spec & low CC (10k)...         Problem List:  ALLERGY (ICD-995.3) - she uses ALLEGRA 180mg  daily and wants to continue w/ this therapy...  PNEUMONIA >>  ~  CXR 2/11 showed normal heart size, sl decr lung volumes but clear, large HH seen, DJD in spine... ~  7/13: Adm w/ LLL Pneumonia> CXR showed LLL & lingular opacities, large HH, DJD in spine... ~  CXR 8/13 showed mild cardiomeg, mild basilar atx w/o infiltrates, mod HH, kyphosis & DJD in TSpine...  HYPERTENSION (ICD-401.9) - on ASA 81mg /d, off Lasix & controlled w/ diet alone, but she notes- "I love salt" (advised restriction), not checking BP's at home, etc...   ~  6/13:  BP= 128/64 & she denies CP, palpit, SOB, edema... ~  8/13:  BP= 122/68 & she remains largely asymptomatic... ~  10/13:  BP= 126/68 & she denies CP, palpit, ch in SOB, edema...  HYPERCHOLESTEROLEMIA (ICD-272.0) - on SIMVASTATIN 40mg /d now... ~  FLP 4/08 showed TChol 218, TG 86, HDL 77, LDL 117 ~  FLP 2/09 showed TChol 198, TG 48, HDL 73, LDL 116 ~  3/10- she ret on diet alone- FLP w/ TChol 263, TG 65, HDL 81, LDL 158... rec> start Simva40. ~  9/10: needs FLP but not fasting today... LFTs are WNL. ~  3/11: reminded of need for f/u FLP on the Simva40. ~  FLP 6/11 on Simva40 showed TChol 189, TG 76, HDL 77, LDL 97... same Rx. ~  FLP 2/12  on Simva40 showed TChol 160, TG 61, HDL 66, LDL 82 ~  FLP 2/13 on Simva40 showed TChol 145, TG 31, HDL 80, LDL 59  HIATAL HERNIA (ICD-553.3) - on NEXIUM 40mg /d... EGD 11/03 by DrDBrodie showed 5cm HH, & esoph stricture dilated... developed dysphagia  5/11 w/ repeat EGD by DrDBrodie> large HH, dilated & improved (may need HH surg)...   ~  CT Abd 2/11 showed large HH; bilat sm effusions & atx; DJD & post op changes in spine; redundant colon & few divertics; increased size of mult hypodense splenic masses & left adrenal mass ?etiology... ~  Ba swallow 2/11 showed large HH, presbyesoph... ~  She denies nausea, vomiting, heartburn, diarrhea, constipation, blood in stool, abdominal pain, swelling, gas...  DIVERTICULOSIS OF COLON (ICD-562.10) & IRRITABLE BOWEL SYNDROME (ICD-564.1) -  ~  last colonoscopy was 11/03 by DrDBrodie & showed divertics only... f/u 57yrs. ~  9/10:  she notes some constipation & Questran discontinued. ~  2/11:  FlexSig in hosp showed lymphocytic colitis- Rx'd Entocort & resolved...  S/P CHOLECYSTECTOMY for Stones 2/11 by DrHoxsworth...  UTIs & URINARY INCONTINENCE >> ~  prev evals by GYN DrMcPhail, & Urology DrTannenbaum; she has frequency, urge & stress incontinence, and a hypotonic bladder- s/p pubovag sling surg for incont... they tried Bethanachol (she stopped this med) & Gelnique (she states didn't help)... ~  Urology f/u by DrTannenbaum 12/10 reviewed & stable w/ phys therapy. ~  Subseq started TOVIAZ 8mg /d & improved, she says... ~  10/13:  Presents w/ urinary symptoms & treated w/ Cipro250 but UA returns clear 7 C&S neg- rec incr fluids & cranberry juice.  HYSTERECTOMY, HX OF (ICD-V45.77) - her last GYN was DrMcPhail before he retired...  DEGENERATIVE JOINT DISEASE (ICD-715.90) - on CELEBREX (uses it Prn)... followed by DrBeane/Olin for Ortho, DrDeveshwar for Rheum, & DrJones for Neurosurg... she has had right knee injections followed by right TKR  1/12 per  DrBeane.  BACK PAIN, LUMBAR (ICD-724.2) - she has chronic kyphoscoliosis... DrBeane has evaluated and Rx w/ shots, Pred, pain meds... she has seen DrRamos & had selective L5 nerve root block (without benefit)... second opinion from DrDJones, Neurosurg w/ lumbar laminectomy 5/10 providing temp relief... Now followed by [facility] for PAIN MANAGEMENT... ~  9/10: c/o recurrent LBP w/ MRI per DrJones and epid shot planned per DrBartko... now on FLECTOR Prn, LYRICA 50mg Tid, PERCOCET up to 3 per day... ~  12/10: note from [facility] reviewed- pain management. ~  5/11:  seen by [facility] for another shot, Vicodin changed to OXYCODONE. ~  She has been seen by DrDeveshwar for Rheum & she rec continued pain management by DrBartko...  OSTEOPENIA (ICD-733.90) - she takes Ca++, MVI, Vit D; prev on Alendronte & DrDeveshwar changed her to [facility] 12/12...  ~  BMD 6/08 at [facility] & showed TScores from -2.0 to -2.7, sl better than in 2005... ~  Vit D level 2/09 was 30 (30-90) and rec to take Vit D 1000 u daily supplement... ~  BMD 10/12 at [facility] showed TScore at lowest measured site= -4.3 in left forearm... ~  12/12:  DrDeveshwar Rx w/ FORTEO starting 12/12... ~  Labs 2/13 showed Vit D level = 32  ANXIETY (ICD-300.00) - she declines anxiolytic Rx despite her anxiety...  ANEMIA OF CHRONIC DISEASE (ICD-285.29) -  taking FEOSOL 200mg  Bid w/ VitC now & added Vit B12 starting 2/12. ~  labs 2/09 showed Hg= 10.4 & MCV= 90... ~  labs 3/10 showed Hg= 12.9 ~  labs 9/10 showed Hg= 12.0, Fe= 89 ~  labs 3/11 showed Hg= 10.7, Fe= 55, TIBC= 119, Sat=35%. ~  labs 6/11 showed Hg= 11.8, Fe= 100 (37%sat). ~  Labs 2/12 showed Hg= 11.8, Fe= 20 (8%sat), Vit B12= 139 ~  Labs 2/13 showed Hg= 12.1, Fe= 109 (45%sat), B12= 1373 ~  Labs 7/13 in hosp showed Hg= 11.3=>9.7 ~  Labs 8/13 showed Hg= 11.4, Fe=63 (26%sat), B12=1500  VITAMIN B-12 DEFICIENCY>  She was supposed to have started on oral Vit B12 supplements 2/12... ~  Labs 2/12  showed Vit B12 level = 139... rec to start oral B12 supplement ~1061mcg daily... ~  2013:  Noted that pt stopped the B12 supplement on her own...   Past Surgical History  Procedure Date  . Cataract surgery   . Abdominal hysterectomy   . Decompressive lumbar laminectomy 06/2008    at L2-3, L3-4 and L4-5 by Dr. Yetta Barre  . Laparoscopic cholecystectomy 04/2009    Dr. Odie Sera    Outpatient Encounter Prescriptions as of 12/27/2011  Medication Sig Dispense Refill  . aspirin 81 MG tablet Take 81 mg by mouth daily.        . calcium-vitamin D (OSCAL WITH D) 500-200 MG-UNIT per tablet Take 1 tablet by mouth daily.       . Cholecalciferol (VITAMIN D) 1000 UNITS capsule Take 1,000 Units by mouth daily.        . diclofenac (FLECTOR) 1.3 % PTCH Place 1 patch onto the skin every 12 (twelve) hours. Apply as directed by Dr. Murray Hodgkins      . Ferrous Sulfate Dried (FEOSOL) 200 (65 FE) MG TABS Take 1 tablet by mouth 2 (two) times daily. Take with a vitamin c 500mg        . Fesoterodine Fumarate (TOVIAZ) 8 MG TB24 Take 8 mg by mouth daily. Per Dr. Patsi Sears       . fexofenadine (ALLEGRA) 180 MG tablet Take  180 mg by mouth daily. As needed for allergies        . FORTEO 600 MCG/2.4ML SOLN Inject 1 mL into the muscle Daily.       [name] NEXIUM 40 MG capsule TAKE 1 CAPSULE DAILY  90 capsule  1  . oxyCODONE-acetaminophen (PERCOCET) 5-325 MG per tablet Take 1 tablet by mouth every 6 (six) hours as needed.       . pregabalin (LYRICA) 50 MG capsule Take 50 mg by mouth 3 (three) times daily.      . [DISCONTINUED] simvastatin (ZOCOR) 40 MG tablet Take 40 mg by mouth at bedtime.      . CELEBREX 200 MG capsule TAKE 1 CAPSULE DAILY AS NEEDED FOR JOINT PAIN  90 capsule  2  . ciprofloxacin (CIPRO) 250 MG tablet Take 1 tablet (250 mg total) by mouth 2 (two) times daily.  14 tablet  0    Allergies  Allergen Reactions  . Amoxicillin-Pot Clavulanate     REACTION: diarrhea  . Sulfonamide Derivatives     REACTION: unsure of  reaction    Current Medications, Allergies, Past Medical History, Past Surgical History, Family History, and Social History were reviewed in [employer] record.    Review of Systems        See HPI - all other systems neg except as noted...       The patient complains of decreased hearing, dyspnea on exertion, incontinence, arthritis all over & difficulty walking.  The patient denies anorexia, fever, weight loss, weight gain, vision loss, hoarseness, chest pain, syncope, peripheral edema, prolonged cough, headaches, hemoptysis, abdominal pain, melena, hematochezia, severe indigestion/heartburn, hematuria, muscle weakness, suspicious skin lesions, transient blindness, depression, unusual weight change, abnormal bleeding, enlarged lymph nodes, and angioedema.     Objective:   Physical Exam     WD, WN, 78 y/o WF in NAD... GENERAL:  Alert & oriented; pleasant & cooperative... HEENT:  [place]/AT, EOM-full, PERRLA, EACs-clear, TMs-wnl, NOSE-clear, THROAT-clear & wnl. NECK:  Supple w/ fairROM; no JVD; normal carotid impulses w/o bruits; no thyromegaly or nodules palpated; no lymphadenopathy. CHEST:  Clear to P & A; without wheezes/ rales/ or rhonchi heard... HEART:  Regular Rhythm; without murmurs/ rubs/ or gallops detected... ABDOMEN:  Soft & sl tenderness, normal bowel sounds; no organomegaly or masses palpated...no guarding or rebound.  BACK:  s/p lumbar lam & scoliosis/ kyphosis EXT: without deformities, mod arthritic changes; no varicose veins/ +venous insuffic/ tr-1+ edema... NEURO:  CN's intact; no focal neuro deficits... DERM:  No lesions noted; no rash etc...  RADIOLOGY DATA:  Reviewed in the EPIC EMR & discussed w/ the patient...  LABORATORY DATA:  Reviewed in the EPIC EMR & discussed w/ the patient...   Assessment & Plan:    Hx of PNEUMONIA>  Adm 7/13 w/ CAP in LLL area, neg cults & treated w/ Roceph/Zithro IV, then Avelox orally; clinically improved & f/u CXR  clear...  HBP>  Remains controlled on diet alone...  Ven Insuffic>  Reminded to avoid sodium, elev legs, support hose..  CHOL>  On Simva40 + diet;  F/u FLP looked good...  GI>  HH/ Dysphagia/ Divertics/ IBS/ Lymphocytic colitis>  Followed by DrDBrodie & stable, continue same...  GU>  UTIs/ Incontinence>  Managed by DrTannenbaum on Toviaz8; UA & C&S 10/13 were neg...  DJD/ LBP/ Chronic Pain Syndrome>  This is her CC & main prob; she was referred to Rheum DrDeveshwar; already seeing Dr Murray Hodgkins for Pain Management, DrOlin & DrBeane for Ortho;  See meds above & continue same> she notes that Lyrica helps leg burning discomfort but still need 3 Percocet/d from [facility] due to back pain...  Anxiety>  She declines anxiolytic meds...  ANEMIA>  Hx Fe & B12 deficient>  On oral supplements her CBC, Fe, B12 are all better...   Patient's Medications  New Prescriptions   CIPROFLOXACIN (CIPRO) 250 MG TABLET    Take 1 tablet (250 mg total) by mouth 2 (two) times daily.  Previous Medications   ASPIRIN 81 MG TABLET    Take 81 mg by mouth daily.     CALCIUM-VITAMIN D (OSCAL WITH D) 500-200 MG-UNIT PER TABLET    Take 1 tablet by mouth daily.    CELEBREX 200 MG CAPSULE    TAKE 1 CAPSULE DAILY AS NEEDED FOR JOINT PAIN   CHOLECALCIFEROL (VITAMIN D) 1000 UNITS CAPSULE    Take 1,000 Units by mouth daily.     DICLOFENAC (FLECTOR) 1.3 % PTCH    Place 1 patch onto the skin every 12 (twelve) hours. Apply as directed by Dr. Ardelle Balls SULFATE DRIED (FEOSOL) 200 (65 FE) MG TABS    Take 1 tablet by mouth 2 (two) times daily. Take with a vitamin c 500mg     FESOTERODINE FUMARATE (TOVIAZ) 8 MG TB24    Take 8 mg by mouth daily. Per Dr. Patsi Sears    FEXOFENADINE (ALLEGRA) 180 MG TABLET    Take 180 mg by mouth daily. As needed for allergies     FORTEO 600 MCG/2.4ML SOLN    Inject 1 mL into the muscle Daily.    NEXIUM 40 MG CAPSULE    TAKE 1 CAPSULE DAILY   OXYCODONE-ACETAMINOPHEN (PERCOCET) 5-325 MG PER TABLET     Take 1 tablet by mouth every 6 (six) hours as needed.    PREGABALIN (LYRICA) 50 MG CAPSULE    Take 50 mg by mouth 3 (three) times daily.  Modified Medications   Modified Medication Previous Medication   SIMVASTATIN (ZOCOR) 40 MG TABLET simvastatin (ZOCOR) 40 MG tablet      TAKE 1 TABLET AT BEDTIME    Take 40 mg by mouth at bedtime.  Discontinued Medications   No medications on file

## 2012-02-15 ENCOUNTER — Other Ambulatory Visit: Payer: Self-pay | Admitting: Pulmonary Disease

## 2012-02-16 MED ORDER — PREGABALIN 50 MG PO CAPS: 50.0000 mg | ORAL_CAPSULE | Freq: Three times a day (TID) | ORAL | Status: DC

## 2012-02-16 NOTE — Telephone Encounter (Signed)
rx has been printed and faxed back for the pt to get the lyrica.  Nothing further is needed.

## 2012-02-24 ENCOUNTER — Telehealth: Payer: Self-pay | Admitting: Pulmonary Disease

## 2012-02-24 MED ORDER — PREGABALIN 50 MG PO CAPS: 50.0000 mg | ORAL_CAPSULE | Freq: Three times a day (TID) | ORAL | Status: DC

## 2012-02-24 NOTE — Telephone Encounter (Signed)
I called and spoke with [facility] and they are stating that we can no longer call in controlled meds. We have to fax a rx or the pt has to mail it in.   I advised the pt that we are going to fax it and it might be awhile before she gets the medication. She understood and agreed.

## 2012-03-03 ENCOUNTER — Telehealth: Payer: Self-pay | Admitting: Pulmonary Disease

## 2012-03-03 MED ORDER — AZITHROMYCIN 250 MG PO TABS: ORAL_TABLET | ORAL | Status: DC

## 2012-03-03 NOTE — Telephone Encounter (Signed)
Per CY----ok to send in zpak #1  Take as directed with no refills.  i called and spoke with pt and she is aware and nothing further is needed.

## 2012-03-03 NOTE — Telephone Encounter (Signed)
Called and spoke with pt and she stated that for the last 2 days she has been having cough with green sputum.  Denies any fever.  Pt is requesting something be called to the pharmacy.  SN please advise. Thanks  Allergies  Allergen Reactions  . Amoxicillin-Pot Clavulanate     REACTION: diarrhea  . Sulfonamide Derivatives     REACTION: unsure of reaction

## 2012-03-28 ENCOUNTER — Ambulatory Visit: Payer: Medicare Other | Admitting: Pulmonary Disease

## 2012-05-02 ENCOUNTER — Ambulatory Visit: Payer: Self-pay | Admitting: Pulmonary Disease

## 2012-05-24 ENCOUNTER — Other Ambulatory Visit: Payer: Self-pay | Admitting: Pulmonary Disease

## 2012-05-25 ENCOUNTER — Other Ambulatory Visit: Payer: Self-pay | Admitting: Pulmonary Disease

## 2012-06-12 ENCOUNTER — Encounter: Payer: Self-pay | Admitting: Pulmonary Disease

## 2012-06-12 ENCOUNTER — Other Ambulatory Visit (INDEPENDENT_AMBULATORY_CARE_PROVIDER_SITE_OTHER): Payer: Medicare Other

## 2012-06-12 ENCOUNTER — Ambulatory Visit (INDEPENDENT_AMBULATORY_CARE_PROVIDER_SITE_OTHER): Payer: Medicare Other | Admitting: Pulmonary Disease

## 2012-06-12 VITALS — BP 140/78 | HR 80 | Temp 98.1°F | Ht 59.0 in | Wt 124.6 lb

## 2012-06-12 DIAGNOSIS — N3281 Overactive bladder: Secondary | ICD-10-CM

## 2012-06-12 DIAGNOSIS — E7800 Pure hypercholesterolemia, unspecified: Secondary | ICD-10-CM

## 2012-06-12 DIAGNOSIS — F411 Generalized anxiety disorder: Secondary | ICD-10-CM

## 2012-06-12 DIAGNOSIS — D649 Anemia, unspecified: Secondary | ICD-10-CM

## 2012-06-12 DIAGNOSIS — I10 Essential (primary) hypertension: Secondary | ICD-10-CM

## 2012-06-12 DIAGNOSIS — M545 Low back pain, unspecified: Secondary | ICD-10-CM

## 2012-06-12 DIAGNOSIS — N318 Other neuromuscular dysfunction of bladder: Secondary | ICD-10-CM

## 2012-06-12 DIAGNOSIS — K5730 Diverticulosis of large intestine without perforation or abscess without bleeding: Secondary | ICD-10-CM

## 2012-06-12 DIAGNOSIS — I872 Venous insufficiency (chronic) (peripheral): Secondary | ICD-10-CM

## 2012-06-12 DIAGNOSIS — K449 Diaphragmatic hernia without obstruction or gangrene: Secondary | ICD-10-CM

## 2012-06-12 DIAGNOSIS — K589 Irritable bowel syndrome without diarrhea: Secondary | ICD-10-CM

## 2012-06-12 DIAGNOSIS — M810 Age-related osteoporosis without current pathological fracture: Secondary | ICD-10-CM

## 2012-06-12 DIAGNOSIS — M1990 Unspecified osteoarthritis, unspecified site: Secondary | ICD-10-CM

## 2012-06-12 DIAGNOSIS — R32 Unspecified urinary incontinence: Secondary | ICD-10-CM

## 2012-06-12 LAB — CBC WITH DIFFERENTIAL/PLATELET
Basophils Relative: 0.2 % (ref 0.0–3.0)
Eosinophils Absolute: 0.1 10*3/uL (ref 0.0–0.7)
Eosinophils Relative: 1.8 % (ref 0.0–5.0)
HCT: 34.9 % — ABNORMAL LOW (ref 36.0–46.0)
Lymphs Abs: 1.4 10*3/uL (ref 0.7–4.0)
MCHC: 33.6 g/dL (ref 30.0–36.0)
MCV: 91.4 fl (ref 78.0–100.0)
Monocytes Absolute: 0.6 10*3/uL (ref 0.1–1.0)
Neutrophils Relative %: 71.5 % (ref 43.0–77.0)
RBC: 3.82 Mil/uL — ABNORMAL LOW (ref 3.87–5.11)

## 2012-06-12 LAB — IBC PANEL: Iron: 78 ug/dL (ref 42–145)

## 2012-06-12 LAB — HEPATIC FUNCTION PANEL
ALT: 31 U/L (ref 0–35)
AST: 28 U/L (ref 0–37)
Bilirubin, Direct: 0 mg/dL (ref 0.0–0.3)
Total Bilirubin: 0.3 mg/dL (ref 0.3–1.2)

## 2012-06-12 LAB — BASIC METABOLIC PANEL
Chloride: 106 mEq/L (ref 96–112)
Creatinine, Ser: 0.8 mg/dL (ref 0.4–1.2)
GFR: 68.88 mL/min (ref >60.00)
Potassium: 4.4 mEq/L (ref 3.5–5.1)

## 2012-06-12 NOTE — Patient Instructions (Addendum)
Today we updated your med list in our EPIC system...    Continue your current medications the same...  Today we did your follow up blood work...    We will contact you w/ the results when available...   Call for any questions...  Let's plan a follow up visit in 6mo, sooner if needed for problems...   

## 2012-06-12 NOTE — Progress Notes (Signed)
Patient ID: Cathy GUINTHER, female   DOB: 04-Oct-1927, 79 y.o.   MRN: [id number]  Subjective:    Patient ID: Cathy [name], female    DOB: 12/29/1927, 79 y.o.   MRN: [id number]  HPI 79 y/o WF here for a follow up visit... she has multiple medical problems including HBP, Hyperchol, HH/ Divertics/ IBS followed by DrDBrodie; hx UTIs/ bladder problems followed by DrTannenbaum; DJD/ LBP/ Osteoporosis followed by DrBeane for Ortho; chronic pain syndrome managed by DrBartko; Anxiety, mild Anemia...  ~  April 28, 2011:  57mo ROV & she continues to c/o pain all over & has mult meds per DrDeveshwar & Bartko> Percocet, Celebrex, Flector, Neurontin, & now on Forteo shots daily (started 12/12); she has difficulty standing, walking, etc...    She saw TP 1/13 w/ bronchitis> Rx Zpak & Pred, now improved just chr weak/ tired/ feeling bad/ mult somatic complaints...    BP now stable on diet alone & measures 122/70 today;  Chol looks good on Simva40;  GI symptoms stable on Nexium...    Fasting labs today all looked good> see below...  ~  June 26,2013:  57mo ROV & she reports that DrDeveshwar changed Neurontin to [facility] & this has helped her leg burning pain tremendously; unfortunatelt she still requires the Percocet per DrBartko 3 times daily due to her back;  She requests Rx for shingles vaccine because MaryJohnson told her you definitely don't want to get this!    HBP, borderline> not on meds; diet controlled- BP= 128/64 and she denies CP, palpit, syncope, ch in SOB/DOE, edema, etc...    CHOL> on Simva40 w/ good control; prev FLP showed all parameters at goal & LDL 59...    LargeHH> on Nexium40 & stable w/o abd pain, n/v, dysphagia, etc...    Divertics/ IBS/ Colitis> treated 2011 for lymphocytic colitis w/ entocort & resolved...    GU> incont & bladder prob treated by DrTannenbaum; on Toviaz8 now but states she prev received a shock in her ankle but Medicare wouldn't pay...    DJD/ Back Pain, Chronic Pain> see above  per DrDeveshwar & DrBartko> on Lyrica50Tid now, Percocet5 Q6H, Celebrex200, Flector, etc...    Osteoporosis> on FORTEO, Calcium, MVI, VitD; DrDeveshwar is in charge of this Rx...    Anemia, B12 Defic> on FeSO4 Bid w/ VitC and she repleted her B12 orally but has apparently stopped this once again... We reviewed prob list, meds, xrays and labs> see below>>  ~  October 18, 2011:  82mo ROV & post hospital check> She was Adm 7/7 - 09/09/11 w/ LLL Pneumonia (CAP); [place] & exposed to a granddaughter w/ cough; after ret to [place] she felt bad, Temp to 103, min cough, no sput, no CP, min SOB, & denies choking/ reflux/ dysphagia/ etc;  CXR revealed LLL & lingular opacity, large HH, clear on right; WBC was 17K w/ left shift, & Pneumococcal Ag was neg w/ Sput cult= NTF only; she was treated w/ Rochephin & Zithromax and improved; disch on Avelox; since disch she has improved to baseline...    Since disch her BP has remained stable on diet alone; she denies abd pain, n/v, choking/reflux, c/d, blood seen; she remains on Oxycodone for pain per [facility] & she is still receiving her Forteo Rx...    We reviewed prob list, meds, xrays and labs> see below for updates >> CXR 8/13 showed mild cardiomeg, mild basilar atx w/o infiltrates, mod HH, kyphosis & DJD in TSpine.Marland KitchenMarland Kitchen  LABS 8/13:  Chems- wnl;  CBC- hg=11.4, wbc=6K, Fe=63 (26%sat);  B12=1500    ~  December 27, 2011:  1mo ROV & Cathy [name] is c/o urinary symptoms- ?dysuria, feels like she's not emptying, painful discomfort, incr freq, sm volumes, but no f/c/s/ etc; recall that she is on Toviaz8 for OB symptoms;  Exam is neg w/o tenderness over bladder & no CVAT;  We decided eval w/ UA (essent clear) & cult (neg- 10k mult spec), & treat w/ Cipro250 & fluids...    Prev pneumonia resolved, CXR 8/13 was clear;  BP controlled on diet= 126/68 today;  Chol has been good on Simva40 (see labs below);  She still sees DrDeveshwar for Rheum> DJD, LBP, osteoporosis on  Percocet, Lyrica, Forteo shots...     We reviewed prob list, meds, xrays and labs> see below for updates >>  LABS 10/13:  UA- essentially clear;  C&S- neg w/ mult spec & low CC (10k)...   ~  June 12, 2012:  60mo ROV & Cathy [name] says "I'm holding up OK but old age is not for whimps"... We reviewed the following medical problems during today's office visit >>     AR> on Allegra180; the pollen is getting to her & we discussed Antihist + nasal saline, electrostatic air cleaner for her bedroom, etc...    HBP, borderline> not on meds; diet controlled- BP= 140/78 and she denies CP, palpit, syncope, ch in SOB/DOE, edema, etc...    CHOL> on Simva40 w/ good control; prev FLP showed all parameters at goal & LDL 59; she is not fasting for repeat FLP today.    LargeHH> on Nexium40 & stable w/o abd pain, n/v, dysphagia, etc...    Divertics/ IBS/ Colitis> treated 2011 for lymphocytic colitis w/ entocort & resolved; she notes some constip & uses a "[employer] med that helps"...    GU> incont & bladder prob treated by DrTannenbaum; on Toviaz8 now but states she prev received a shock in her ankle but Medicare wouldn't pay...    DJD/ Back Pain, Chronic Pain> see above per DrDeveshwar & DrBartko> on Lyrica50Tid now, Percocet5 Q6H, Celebrex200, Flector, etc...    Osteoporosis> on FORTEO (per DrDeveshwar), Calcium, MVI, VitD1000...    Anemia, B12 Defic> prev on FeSO4 Bid w/ VitC=> she repleted her B12 orally but has apparently stopped this once again... We reviewed prob list, meds, xrays and labs> see below for updates >> she had the 2013 flu vaccine 9/13; she is here alone, didn't bring meds, didn't bring list; Time spent=52min... LABS 4/14:  Chems- wnl;  CBC- ok x Hg=11.7 Fe=78 (31%sat);  TSH=1.19...          Problem List:  ALLERGY (ICD-995.3) - she uses ALLEGRA 180mg  daily and wants to continue w/ this therapy...  PNEUMONIA >>  ~  CXR 2/11 showed normal heart size, sl decr lung volumes but clear, large HH seen, DJD in  spine... ~  7/13: Adm w/ LLL Pneumonia> CXR showed LLL & lingular opacities, large HH, DJD in spine... ~  CXR 8/13 showed mild cardiomeg, mild basilar atx w/o infiltrates, mod HH, kyphosis & DJD in TSpine...  HYPERTENSION (ICD-401.9) - on ASA 81mg /d, off Lasix & controlled w/ diet alone, but she notes- "I love salt" (advised restriction), not checking BP's at home, etc...   ~  6/13:  BP= 128/64 & she denies CP, palpit, SOB, edema... ~  8/13:  BP= 122/68 & she remains largely asymptomatic... ~  10/13:  BP= 126/68 & she denies CP, palpit,  ch in SOB, edema... ~  4/14:  not on meds; diet controlled- BP= 140/78 and she denies CP, palpit, syncope, ch in SOB/DOE, edema, etc.  HYPERCHOLESTEROLEMIA (ICD-272.0) - on SIMVASTATIN 40mg /d now... ~  FLP 4/08 showed TChol 218, TG 86, HDL 77, LDL 117 ~  FLP 2/09 showed TChol 198, TG 48, HDL 73, LDL 116 ~  3/10- she ret on diet alone- FLP w/ TChol 263, TG 65, HDL 81, LDL 158... rec> start Simva40. ~  9/10: needs FLP but not fasting today... LFTs are WNL. ~  3/11: reminded of need for f/u FLP on the Simva40. ~  FLP 6/11 on Simva40 showed TChol 189, TG 76, HDL 77, LDL 97... same Rx. ~  FLP 2/12 on Simva40 showed TChol 160, TG 61, HDL 66, LDL 82 ~  FLP 2/13 on Simva40 showed TChol 145, TG 31, HDL 80, LDL 59 ~  She is reminded to ret fasting for f/u FLP...  HIATAL HERNIA (ICD-553.3) - on NEXIUM 40mg /d... EGD 11/03 by DrDBrodie showed 5cm HH, & esoph stricture dilated... developed dysphagia  5/11 w/ repeat EGD by DrDBrodie> large HH, dilated & improved (may need HH surg)...   ~  CT Abd 2/11 showed large HH; bilat sm effusions & atx; DJD & post op changes in spine; redundant colon & few divertics; increased size of mult hypodense splenic masses & left adrenal mass ?etiology... ~  Ba swallow 2/11 showed large HH, presbyesoph... ~  She denies nausea, vomiting, heartburn, diarrhea, constipation, blood in stool, abdominal pain, swelling, gas...  DIVERTICULOSIS OF  COLON (ICD-562.10) & IRRITABLE BOWEL SYNDROME (ICD-564.1) -  ~  last colonoscopy was 11/03 by DrDBrodie & showed divertics only... f/u 2yrs. ~  9/10:  she notes some constipation & Questran discontinued. ~  2/11:  FlexSig in hosp showed lymphocytic colitis- Rx'd Entocort & resolved... ~  She notes intermittent constip & uses an OTC [employer] med for this...  S/P CHOLECYSTECTOMY for Stones 2/11 by DrHoxsworth...  UTIs & URINARY INCONTINENCE >> ~  prev evals by GYN DrMcPhail, & Urology DrTannenbaum; she has frequency, urge & stress incontinence, and a hypotonic bladder- s/p pubovag sling surg for incont... they tried Bethanachol (she stopped this med) & Gelnique (she states didn't help)... ~  Urology f/u by DrTannenbaum 12/10 reviewed & stable w/ phys therapy. ~  Subseq started TOVIAZ 8mg /d & improved, she says... ~  10/13:  Presents w/ urinary symptoms & treated w/ Cipro250 but UA returns clear 7 C&S neg- rec incr fluids & cranberry juice.  HYSTERECTOMY, HX OF (ICD-V45.77) - her last GYN was DrMcPhail before he retired...  DEGENERATIVE JOINT DISEASE (ICD-715.90) - on CELEBREX (uses it Prn)... followed by DrBeane/Olin for Ortho, DrDeveshwar for Rheum, & DrJones for Neurosurg... she has had right knee injections followed by right TKR 1/12 per DrBeane.  BACK PAIN, LUMBAR (ICD-724.2) - she has chronic kyphoscoliosis... DrBeane has evaluated and Rx w/ shots, Pred, pain meds... she has seen DrRamos & had selective L5 nerve root block (without benefit)... second opinion from DrDJones, Neurosurg w/ lumbar laminectomy 5/10 providing temp relief... Now followed by [facility] for PAIN MANAGEMENT... ~  9/10: c/o recurrent LBP w/ MRI per DrJones and epid shot planned per DrBartko... now on FLECTOR Prn, LYRICA 50mg Tid, PERCOCET up to 3 per day... ~  12/10: note from [facility] reviewed- pain management. ~  5/11:  seen by [facility] for another shot, Vicodin changed to OXYCODONE. ~  She has been seen by Rober Minion  for Rheum & she rec continued  pain management by DrBartko...  OSTEOPENIA (ICD-733.90) - she takes Ca++, MVI, Vit D; prev on Alendronte & DrDeveshwar changed her to [facility] 12/12...  ~  BMD 6/08 at [facility] & showed TScores from -2.0 to -2.7, sl better than in 2005... ~  Vit D level 2/09 was 30 (30-90) and rec to take Vit D 1000 u daily supplement... ~  BMD 10/12 at [facility] showed TScore at lowest measured site= -4.3 in left forearm... ~  12/12:  DrDeveshwar Rx w/ FORTEO starting 12/12... ~  Labs 2/13 showed Vit D level = 32  ANXIETY (ICD-300.00) - she declines anxiolytic Rx despite her anxiety...  ANEMIA OF CHRONIC DISEASE (ICD-285.29) -  taking FEOSOL 200mg  Bid w/ VitC now & added Vit B12 starting 2/12. ~  labs 2/09 showed Hg= 10.4 & MCV= 90... ~  labs 3/10 showed Hg= 12.9 ~  labs 9/10 showed Hg= 12.0, Fe= 89 ~  labs 3/11 showed Hg= 10.7, Fe= 55, TIBC= 119, Sat=35%. ~  labs 6/11 showed Hg= 11.8, Fe= 100 (37%sat). ~  Labs 2/12 showed Hg= 11.8, Fe= 20 (8%sat), Vit B12= 139 ~  Labs 2/13 showed Hg= 12.1, Fe= 109 (45%sat), B12= 1373 ~  Labs 7/13 in hosp showed Hg= 11.3=>9.7 ~  Labs 8/13 showed Hg= 11.4, Fe=63 (26%sat), B12=1500 ~  Labs 4/14 showed Hg= 11.7, Fe=77 (31%sat)  VITAMIN B-12 DEFICIENCY>  She was supposed to have started on oral Vit B12 supplements 2/12... ~  Labs 2/12 showed Vit B12 level = 139... rec to start oral B12 supplement ~1016mcg daily... ~  2013:  Noted that pt stopped the B12 supplement on her own...   Past Surgical History  Procedure Laterality Date  . Cataract surgery    . Abdominal hysterectomy    . Decompressive lumbar laminectomy  06/2008    at L2-3, L3-4 and L4-5 by Dr. Yetta Barre  . Laparoscopic cholecystectomy  04/2009    Dr. Odie Sera    Outpatient Encounter Prescriptions as of 06/12/2012  Medication Sig Dispense Refill  . aspirin 81 MG tablet Take 81 mg by mouth daily.        . calcium-vitamin D (OSCAL WITH D) 500-200 MG-UNIT per tablet Take 1 tablet by  mouth daily.       . CELEBREX 200 MG capsule TAKE 1 CAPSULE DAILY AS NEEDED FOR JOINT PAIN  90 capsule  1  . Cholecalciferol (VITAMIN D) 1000 UNITS capsule Take 1,000 Units by mouth daily.        . diclofenac (FLECTOR) 1.3 % PTCH Place 1 patch onto the skin every 12 (twelve) hours. Apply as directed by Dr. Murray Hodgkins      . Ferrous Sulfate Dried (FEOSOL) 200 (65 FE) MG TABS Take 1 tablet by mouth 2 (two) times daily. Take with a vitamin c 500mg        . Fesoterodine Fumarate (TOVIAZ) 8 MG TB24 Take 8 mg by mouth daily. Per Dr. Patsi Sears       . fexofenadine (ALLEGRA) 180 MG tablet Take 180 mg by mouth daily. As needed for allergies        . FORTEO 600 MCG/2.4ML SOLN Inject 1 mL into the muscle Daily.       [name] NEXIUM 40 MG capsule TAKE 1 CAPSULE DAILY  90 capsule  1  . oxyCODONE-acetaminophen (PERCOCET) 5-325 MG per tablet Take 1 tablet by mouth every 6 (six) hours as needed.       . pregabalin (LYRICA) 50 MG capsule Take 1 capsule (50 mg total) by mouth  3 (three) times daily.  270 capsule  1  . simvastatin (ZOCOR) 40 MG tablet TAKE 1 TABLET AT BEDTIME  90 tablet  1  . [DISCONTINUED] azithromycin (ZITHROMAX) 250 MG tablet Take as directed  6 each  0  . [DISCONTINUED] ciprofloxacin (CIPRO) 250 MG tablet Take 1 tablet (250 mg total) by mouth 2 (two) times daily.  14 tablet  0   No facility-administered encounter medications on file as of 06/12/2012.    Allergies  Allergen Reactions  . Amoxicillin-Pot Clavulanate     REACTION: diarrhea  . Sulfonamide Derivatives     REACTION: unsure of reaction    Current Medications, Allergies, Past Medical History, Past Surgical History, Family History, and Social History were reviewed in [employer] record.    Review of Systems       See HPI - all other systems neg except as noted...       The patient complains of decreased hearing, dyspnea on exertion, incontinence, arthritis all over & difficulty walking.  The patient denies  anorexia, fever, weight loss, weight gain, vision loss, hoarseness, chest pain, syncope, peripheral edema, prolonged cough, headaches, hemoptysis, abdominal pain, melena, hematochezia, severe indigestion/heartburn, hematuria, muscle weakness, suspicious skin lesions, transient blindness, depression, unusual weight change, abnormal bleeding, enlarged lymph nodes, and angioedema.     Objective:   Physical Exam    WD, WN, 79 y/o WF in NAD... GENERAL:  Alert & oriented; pleasant & cooperative... HEENT:  [place]/AT, EOM-full, PERRLA, EACs-clear, TMs-wnl, NOSE-clear, THROAT-clear & wnl. NECK:  Supple w/ fairROM; no JVD; normal carotid impulses w/o bruits; no thyromegaly or nodules palpated; no lymphadenopathy. CHEST:  Clear to P & A; without wheezes/ rales/ or rhonchi heard... HEART:  Regular Rhythm; without murmurs/ rubs/ or gallops detected... ABDOMEN:  Soft & sl tenderness, normal bowel sounds; no organomegaly or masses palpated...no guarding or rebound.  BACK:  s/p lumbar lam & scoliosis/ kyphosis EXT: without deformities, mod arthritic changes; no varicose veins/ +venous insuffic/ tr-1+ edema... NEURO:  CN's intact; no focal neuro deficits... DERM:  No lesions noted; no rash etc...  RADIOLOGY DATA:  Reviewed in the EPIC EMR & discussed w/ the patient...  LABORATORY DATA:  Reviewed in the EPIC EMR & discussed w/ the patient...   Assessment & Plan:    Hx of PNEUMONIA>  Adm 7/13 w/ CAP in LLL area, neg cults & treated w/ Roceph/Zithro IV, then Avelox orally; clinically improved & f/u CXR clear...  HBP>  Remains controlled on diet alone...  Ven Insuffic>  Reminded to avoid sodium, elev legs, support hose..  CHOL>  On Simva40 + diet;  F/u FLP looked good...  GI>  HH/ Dysphagia/ Divertics/ IBS/ Lymphocytic colitis>  Followed by DrDBrodie & stable, continue same...  GU>  UTIs/ Incontinence>  Managed by DrTannenbaum on Toviaz8; UA & C&S 10/13 were neg...  DJD/ LBP/ Chronic Pain Syndrome>  This  is her CC & main prob; she was referred to Rheum DrDeveshwar; already seeing Dr Murray Hodgkins for Pain Management, DrOlin & DrBeane for Ortho;  See meds above & continue same> she notes that Lyrica helps leg burning discomfort but still need 3 Percocet/d from [facility] due to back pain...  Anxiety>  She declines anxiolytic meds...  ANEMIA>  Hx Fe & B12 deficient>  On oral supplements her CBC, Fe, B12 are all better...   Patient's Medications  New Prescriptions   No medications on file  Previous Medications   ASPIRIN 81 MG TABLET    Take 81  mg by mouth daily.     CALCIUM-VITAMIN D (OSCAL WITH D) 500-200 MG-UNIT PER TABLET    Take 1 tablet by mouth daily.    CELEBREX 200 MG CAPSULE    TAKE 1 CAPSULE DAILY AS NEEDED FOR JOINT PAIN   CHOLECALCIFEROL (VITAMIN D) 1000 UNITS CAPSULE    Take 1,000 Units by mouth daily.     DICLOFENAC (FLECTOR) 1.3 % PTCH    Place 1 patch onto the skin every 12 (twelve) hours. Apply as directed by Dr. Ardelle Balls SULFATE DRIED (FEOSOL) 200 (65 FE) MG TABS    Take 1 tablet by mouth 2 (two) times daily. Take with a vitamin c 500mg     FESOTERODINE FUMARATE (TOVIAZ) 8 MG TB24    Take 8 mg by mouth daily. Per Dr. Patsi Sears    FEXOFENADINE (ALLEGRA) 180 MG TABLET    Take 180 mg by mouth daily. As needed for allergies     FORTEO 600 MCG/2.4ML SOLN    Inject 1 mL into the muscle Daily.    NEXIUM 40 MG CAPSULE    TAKE 1 CAPSULE DAILY   OXYCODONE-ACETAMINOPHEN (PERCOCET) 5-325 MG PER TABLET    Take 1 tablet by mouth every 6 (six) hours as needed.    PREGABALIN (LYRICA) 50 MG CAPSULE    Take 1 capsule (50 mg total) by mouth 3 (three) times daily.   SIMVASTATIN (ZOCOR) 40 MG TABLET    TAKE 1 TABLET AT BEDTIME  Modified Medications   No medications on file  Discontinued Medications   AZITHROMYCIN (ZITHROMAX) 250 MG TABLET    Take as directed   CIPROFLOXACIN (CIPRO) 250 MG TABLET    Take 1 tablet (250 mg total) by mouth 2 (two) times daily.

## 2012-06-30 ENCOUNTER — Telehealth: Payer: Self-pay | Admitting: Pulmonary Disease

## 2012-06-30 MED ORDER — LEVOFLOXACIN 500 MG PO TABS: 500.0000 mg | ORAL_TABLET | Freq: Every day | ORAL | Status: DC

## 2012-06-30 NOTE — Telephone Encounter (Signed)
Pt aware of recs. RX has been sent. Nothing further was needed

## 2012-06-30 NOTE — Telephone Encounter (Signed)
ATC pt line busy x 4 wcb 

## 2012-06-30 NOTE — Telephone Encounter (Signed)
Spoke with pt She is c/o prod cough with large amounts of green to yellow sputum x 4 days Taking mucinex and delsym with not much relief Denies f/c/s, SOB, CP, wheezing or other co's Please advise thanks Last ov 06/12/12 Next ov 12/05/12 Allergies  Allergen Reactions  . Amoxicillin-Pot Clavulanate     REACTION: diarrhea  . Sulfonamide Derivatives     REACTION: unsure of reaction

## 2012-06-30 NOTE — Telephone Encounter (Signed)
Per SN---  levaquin 500 mg  #7  1 daily Align once daily  mucinex 2 po bid Increase fluids

## 2012-08-01 ENCOUNTER — Other Ambulatory Visit: Payer: Self-pay | Admitting: Pulmonary Disease

## 2012-09-13 ENCOUNTER — Other Ambulatory Visit: Payer: Self-pay | Admitting: Pulmonary Disease

## 2012-09-13 MED ORDER — PREGABALIN 50 MG PO CAPS: 50.0000 mg | ORAL_CAPSULE | Freq: Three times a day (TID) | ORAL | Status: DC

## 2012-10-18 ENCOUNTER — Telehealth: Payer: Self-pay | Admitting: Pulmonary Disease

## 2012-10-18 NOTE — Telephone Encounter (Signed)
Patient returning call.   [phone]

## 2012-10-18 NOTE — Telephone Encounter (Signed)
LMOMTCB X1 for pt 

## 2012-10-18 NOTE — Telephone Encounter (Signed)
I spoke with [facility]. She stated she received a letter from [facility]. The lyrica is no longer preferred on insurance. gabapentin is on preferred list. She stated pt has had this in the past and does not help. She stated we need to call [phone] for a PA. ID# [id number]. Dr. Kriste Basque please advise if oyu want [place] to do PA thanks

## 2012-10-19 NOTE — Telephone Encounter (Signed)
Per SN---   Stay on the lyrica.  Her copay may go up for the lyrica.  thanks

## 2012-10-19 NOTE — Telephone Encounter (Signed)
I called # to initiate PA. They are going to fax over the PA form to fill out and fax back.

## 2012-10-19 NOTE — Telephone Encounter (Signed)
Form completed and given to Leigh. Carron Curie, CMA

## 2012-10-26 NOTE — Telephone Encounter (Signed)
Form was faxed back on 8/22.  Waiting on approval or denial.

## 2012-10-26 NOTE — Telephone Encounter (Signed)
Do you have an approval or denial on this yet? Thanks.

## 2012-10-28 ENCOUNTER — Other Ambulatory Visit: Payer: Self-pay | Admitting: Pulmonary Disease

## 2012-11-02 NOTE — Telephone Encounter (Signed)
Please advise. Thanks.  

## 2012-11-03 NOTE — Telephone Encounter (Signed)
Called and spoke with Cathy [name] and she is aware that the lyrica does not need a PA done.  The daughter stated that she will let her mother refill the medication and if there is any problem, she will call the insurance company for any problems, etc.

## 2012-11-03 NOTE — Telephone Encounter (Signed)
Called and checked on the PA for the lyrica.   This medication is not needing a PA per her insurance.  They prefer the gabapentin, but she is within the limit of the lyrica and this is covered by her insurance.   Transferred to patient member services--was placed on hold and i will call back.   [phone]

## 2012-11-19 ENCOUNTER — Other Ambulatory Visit: Payer: Self-pay | Admitting: Pulmonary Disease

## 2012-12-05 ENCOUNTER — Encounter: Payer: Self-pay | Admitting: Pulmonary Disease

## 2012-12-05 ENCOUNTER — Ambulatory Visit (INDEPENDENT_AMBULATORY_CARE_PROVIDER_SITE_OTHER): Payer: Medicare Other | Admitting: Pulmonary Disease

## 2012-12-05 VITALS — BP 124/78 | HR 58 | Temp 98.3°F | Ht 59.0 in | Wt 127.4 lb

## 2012-12-05 DIAGNOSIS — Z23 Encounter for immunization: Secondary | ICD-10-CM

## 2012-12-05 DIAGNOSIS — M545 Low back pain, unspecified: Secondary | ICD-10-CM

## 2012-12-05 DIAGNOSIS — E7800 Pure hypercholesterolemia, unspecified: Secondary | ICD-10-CM

## 2012-12-05 DIAGNOSIS — D649 Anemia, unspecified: Secondary | ICD-10-CM

## 2012-12-05 DIAGNOSIS — F411 Generalized anxiety disorder: Secondary | ICD-10-CM

## 2012-12-05 DIAGNOSIS — M810 Age-related osteoporosis without current pathological fracture: Secondary | ICD-10-CM

## 2012-12-05 DIAGNOSIS — K589 Irritable bowel syndrome without diarrhea: Secondary | ICD-10-CM

## 2012-12-05 DIAGNOSIS — K5730 Diverticulosis of large intestine without perforation or abscess without bleeding: Secondary | ICD-10-CM

## 2012-12-05 DIAGNOSIS — K449 Diaphragmatic hernia without obstruction or gangrene: Secondary | ICD-10-CM

## 2012-12-05 DIAGNOSIS — N318 Other neuromuscular dysfunction of bladder: Secondary | ICD-10-CM

## 2012-12-05 DIAGNOSIS — M1990 Unspecified osteoarthritis, unspecified site: Secondary | ICD-10-CM

## 2012-12-05 DIAGNOSIS — N390 Urinary tract infection, site not specified: Secondary | ICD-10-CM

## 2012-12-05 DIAGNOSIS — N3281 Overactive bladder: Secondary | ICD-10-CM

## 2012-12-05 MED ORDER — PREGABALIN 50 MG PO CAPS: 50.0000 mg | ORAL_CAPSULE | Freq: Three times a day (TID) | ORAL | Status: DC

## 2012-12-05 NOTE — Patient Instructions (Addendum)
Today we updated your med list in our EPIC system...    Continue your current medications the same...  We refilled your Lyrica per request...  Please return to our labs one morning this week for your follow up FASTING blood work...    We will contact you w/ the results when available...   We gave you the 2014 FLU vaccine today...  Call for any questions...  Let's plan a follow up visit in 20mo, sooner if needed for problems.Marland KitchenMarland Kitchen

## 2012-12-05 NOTE — Progress Notes (Signed)
Patient ID: DALLAS SCORSONE, female   DOB: September 02, 1927, 79 y.o.   MRN: [id number]  Subjective:    Patient ID: Cathy [name], female    DOB: 1927-03-04, 79 y.o.   MRN: [id number]  HPI 79 y/o WF here for a follow up visit... she has multiple medical problems including HBP, Hyperchol, HH/ Divertics/ IBS followed by DrDBrodie; hx UTIs/ bladder problems followed by DrTannenbaum; DJD/ LBP/ Osteoporosis followed by DrBeane for Ortho; chronic pain syndrome managed by DrBartko; Anxiety, mild Anemia...  ~  April 28, 2011:  77mo ROV & she continues to c/o pain all over & has mult meds per DrDeveshwar & Bartko> Percocet, Celebrex, Flector, Neurontin, & now on Forteo shots daily (started 12/12); she has difficulty standing, walking, etc...    She saw TP 1/13 w/ bronchitis> Rx Zpak & Pred, now improved just chr weak/ tired/ feeling bad/ mult somatic complaints...    BP now stable on diet alone & measures 122/70 today;  Chol looks good on Simva40;  GI symptoms stable on Nexium...    Fasting labs today all looked good> see below...  ~  June 26,2013:  28mo ROV & she reports that DrDeveshwar changed Neurontin to [facility] & this has helped her leg burning pain tremendously; unfortunatelt she still requires the Percocet per DrBartko 3 times daily due to her back;  She requests Rx for shingles vaccine because MaryJohnson told her you definitely don't want to get this!    HBP, borderline> not on meds; diet controlled- BP= 128/64 and she denies CP, palpit, syncope, ch in SOB/DOE, edema, etc...    CHOL> on Simva40 w/ good control; prev FLP showed all parameters at goal & LDL 59...    LargeHH> on Nexium40 & stable w/o abd pain, n/v, dysphagia, etc...    Divertics/ IBS/ Colitis> treated 2011 for lymphocytic colitis w/ entocort & resolved...    GU> incont & bladder prob treated by DrTannenbaum; on Toviaz8 now but states she prev received a shock in her ankle but Medicare wouldn't pay...    DJD/ Back Pain, Chronic Pain> see above  per DrDeveshwar & DrBartko> on Lyrica50Tid now, Percocet5 Q6H, Celebrex200, Flector, etc...    Osteoporosis> on FORTEO, Calcium, MVI, VitD; DrDeveshwar is in charge of this Rx...    Anemia, B12 Defic> on FeSO4 Bid w/ VitC and she repleted her B12 orally but has apparently stopped this once again... We reviewed prob list, meds, xrays and labs> see below>>  ~  October 18, 2011:  4mo ROV & post hospital check> She was Adm 7/7 - 09/09/11 w/ LLL Pneumonia (CAP); [place] & exposed to a granddaughter w/ cough; after ret to [place] she felt bad, Temp to 103, min cough, no sput, no CP, min SOB, & denies choking/ reflux/ dysphagia/ etc;  CXR revealed LLL & lingular opacity, large HH, clear on right; WBC was 17K w/ left shift, & Pneumococcal Ag was neg w/ Sput cult= NTF only; she was treated w/ Rochephin & Zithromax and improved; disch on Avelox; since disch she has improved to baseline...    Since disch her BP has remained stable on diet alone; she denies abd pain, n/v, choking/reflux, c/d, blood seen; she remains on Oxycodone for pain per [facility] & she is still receiving her Forteo Rx...    We reviewed prob list, meds, xrays and labs> see below for updates >> CXR 8/13 showed mild cardiomeg, mild basilar atx w/o infiltrates, mod HH, kyphosis & DJD in TSpine.Marland KitchenMarland Kitchen  LABS 8/13:  Chems- wnl;  CBC- hg=11.4, wbc=6K, Fe=63 (26%sat);  B12=1500   ~  December 27, 2011:  58mo ROV & Cathy [name] is c/o urinary symptoms- ?dysuria, feels like she's not emptying, painful discomfort, incr freq, sm volumes, but no f/c/s/ etc; recall that she is on Toviaz8 for OB symptoms;  Exam is neg w/o tenderness over bladder & no CVAT;  We decided eval w/ UA (essent clear) & cult (neg- 10k mult spec), & treat w/ Cipro250 & fluids...    Prev pneumonia resolved, CXR 8/13 was clear;  BP controlled on diet= 126/68 today;  Chol has been good on Simva40 (see labs below);  She still sees DrDeveshwar for Rheum> DJD, LBP, osteoporosis on  Percocet, Lyrica, Forteo shots...     We reviewed prob list, meds, xrays and labs> see below for updates >>  LABS 10/13:  UA- essentially clear;  C&S- neg w/ mult spec & low CC (10k)...   ~  June 12, 2012:  50mo ROV & Cathy [name] says "I'm holding up OK but old age is not for whimps"... We reviewed the following medical problems during today's office visit >>     AR> on Allegra180; the pollen is getting to her & we discussed Antihist + nasal saline, electrostatic air cleaner for her bedroom, etc...    HBP, borderline> not on meds; diet controlled- BP= 140/78 and she denies CP, palpit, syncope, ch in SOB/DOE, edema, etc...    CHOL> on Simva40 w/ good control; prev FLP showed all parameters at goal & LDL 59; she is not fasting for repeat FLP today.    LargeHH> on Nexium40 & stable w/o abd pain, n/v, dysphagia, etc...    Divertics/ IBS/ Colitis> treated 2011 for lymphocytic colitis w/ entocort & resolved; she notes some constip & uses a "[employer] med that helps"...    GU> incont & bladder prob treated by DrTannenbaum; on Toviaz8 now but states she prev received a shock in her ankle but Medicare wouldn't pay...    DJD/ Back Pain, Chronic Pain> see above per DrDeveshwar & DrBartko> on Lyrica50Tid now, Percocet5 Q6H, Celebrex200, Flector, etc...    Osteoporosis> on FORTEO (per DrDeveshwar), Calcium, MVI, VitD1000...    Anemia, B12 Defic> prev on FeSO4 Bid w/ VitC=> she repleted her B12 orally but has apparently stopped this once again... We reviewed prob list, meds, xrays and labs> see below for updates >> she had the 2013 flu vaccine 9/13; she is here alone, didn't bring meds, didn't bring list; Time spent=63min... LABS 4/14:  Chems- wnl;  CBC- ok x Hg=11.7 Fe=78 (31%sat);  TSH=1.19...   ~  December 05, 2012:  50mo ROV & Cathy [name] is stable- no new complaints or concerns, just notes that "life sucks- I can't do like i used to do"... We reviewed the following medical problems during today's office visit >>     AR> on  Allegra180; the pollen is getting to her & we discussed Antihist + nasal saline, electrostatic air cleaner for her bedroom, etc...    HBP, borderline> not on meds; diet controlled- BP= 124/78 and she denies CP, palpit, syncope, ch in SOB/DOE, edema, etc...    CHOL> on Simva40 w/ good control;  FLP 10/14 shows TChol 156, TG 39, HDL 86, LDL 62; continue same...    LargeHH> on Nexium40 & stable w/o abd pain, n/v, dysphagia, etc...    Divertics/ IBS/ Colitis> treated 2011 for lymphocytic colitis w/ entocort & resolved; she notes some constip & uses a [employer] med  that helps"...    GU> incont & bladder prob treated by DrTannenbaum; on Toviaz8 & prev received a shock in her ankle but Medicare wouldn't pay...     DJD/ Back Pain, Chronic Pain> followed by DrDeveshwar & DrBartko> on Lyrica50Tid now, Percocet5 Q6H, Celebrex200, Flector, etc...    Osteoporosis> on FORTEO (per DrDeveshwar), Calcium, MVI, VitD1000...    Anemia, B12 Defic> on FeSO4 daily, she stopped oral B12; Labs 10/14 showed Hg= 12.4 & Fe= 141 (ok to decr Fe to qod)...  We reviewed prob list, meds, xrays and labs> see below for updates >>  LABS 10/14:  FLP- at goals on simva40;  Chems- wnl;  CBC- wnl & Fe=141 (54%sat) on Fe daily (ok to decr to qod)...           Problem List:  ALLERGY (ICD-995.3) - she uses ALLEGRA 180mg  daily and wants to continue w/ this therapy...  PNEUMONIA >>  ~  CXR 2/11 showed normal heart size, sl decr lung volumes but clear, large HH seen, DJD in spine... ~  7/13: Adm w/ LLL Pneumonia> CXR showed LLL & lingular opacities, large HH, DJD in spine... ~  CXR 8/13 showed mild cardiomeg, mild basilar atx w/o infiltrates, mod HH, kyphosis & DJD in TSpine...  HYPERTENSION (ICD-401.9) - on ASA 81mg /d, off Lasix & controlled w/ diet alone, but she notes- "I love salt" (advised restriction), not checking BP's at home, etc...   ~  6/13:  BP= 128/64 & she denies CP, palpit, SOB, edema... ~  8/13:  BP= 122/68 & she remains  largely asymptomatic... ~  10/13:  BP= 126/68 & she denies CP, palpit, ch in SOB, edema... ~  4/14:  not on meds; diet controlled- BP= 140/78 and she denies CP, palpit, syncope, ch in SOB/DOE, edema, etc. ~  10/14: not on meds; diet controlled- BP= 124/78 and she remains asymptomatic...  HYPERCHOLESTEROLEMIA (ICD-272.0) - on SIMVASTATIN 40mg /d now... ~  FLP 4/08 showed TChol 218, TG 86, HDL 77, LDL 117 ~  FLP 2/09 showed TChol 198, TG 48, HDL 73, LDL 116 ~  3/10- she ret on diet alone- FLP w/ TChol 263, TG 65, HDL 81, LDL 158... rec> start Simva40. ~  9/10: needs FLP but not fasting today... LFTs are WNL. ~  3/11: reminded of need for f/u FLP on the Simva40. ~  FLP 6/11 on Simva40 showed TChol 189, TG 76, HDL 77, LDL 97... same Rx. ~  FLP 2/12 on Simva40 showed TChol 160, TG 61, HDL 66, LDL 82 ~  FLP 2/13 on Simva40 showed TChol 145, TG 31, HDL 80, LDL 59 ~  FLP 10/14 on Simva40 showed TChol 156, TG 39, HDL 86, LDL 62   HIATAL HERNIA (ICD-553.3) - on NEXIUM 40mg /d... EGD 11/03 by DrDBrodie showed 5cm HH, & esoph stricture dilated... developed dysphagia  5/11 w/ repeat EGD by DrDBrodie> large HH, dilated & improved (may need HH surg)...   ~  CT Abd 2/11 showed large HH; bilat sm effusions & atx; DJD & post op changes in spine; redundant colon & few divertics; increased size of mult hypodense splenic masses & left adrenal mass ?etiology... ~  Ba swallow 2/11 showed large HH, presbyesoph... ~  She denies nausea, vomiting, heartburn, diarrhea, constipation, blood in stool, abdominal pain, swelling, gas...  DIVERTICULOSIS OF COLON (ICD-562.10) & IRRITABLE BOWEL SYNDROME (ICD-564.1) -  ~  last colonoscopy was 11/03 by DrDBrodie & showed divertics only... f/u 59yrs. ~  9/10:  she notes some constipation &  Questran discontinued. ~  2/11:  FlexSig in hosp showed lymphocytic colitis- Rx'd Entocort & resolved... ~  She notes intermittent constip & uses an OTC [employer] med for this...  S/P  CHOLECYSTECTOMY for Stones 2/11 by DrHoxsworth...  UTIs & URINARY INCONTINENCE >> ~  prev evals by GYN DrMcPhail, & Urology DrTannenbaum; she has frequency, urge & stress incontinence, and a hypotonic bladder- s/p pubovag sling surg for incont... they tried Bethanachol (she stopped this med) & Gelnique (she states didn't help)... ~  Urology f/u by DrTannenbaum 12/10 reviewed & stable w/ phys therapy. ~  Subseq started TOVIAZ 8mg /d & improved, she says... ~  10/13:  Presents w/ urinary symptoms & treated w/ Cipro250 but UA returns clear 7 C&S neg- rec incr fluids & cranberry juice. ~  10/14:  incont & bladder prob treated by DrTannenbaum; on Toviaz8 & prev received a shock in her ankle but Medicare wouldn't pay.[name]   HYSTERECTOMY, HX OF (ICD-V45.77) - her last GYN was DrMcPhail before he retired...  DEGENERATIVE JOINT DISEASE (ICD-715.90) - on CELEBREX (uses it Prn)... followed by DrBeane/Olin for Ortho, DrDeveshwar for Rheum, & DrJones for Neurosurg... she has had right knee injections followed by right TKR 1/12 per DrBeane.  BACK PAIN, LUMBAR (ICD-724.2) - she has chronic kyphoscoliosis... DrBeane has evaluated and Rx w/ shots, Pred, pain meds... she has seen DrRamos & had selective L5 nerve root block (without benefit)... second opinion from DrDJones, Neurosurg w/ lumbar laminectomy 5/10 providing temp relief... Now followed by [facility] for PAIN MANAGEMENT... ~  9/10: c/o recurrent LBP w/ MRI per DrJones and epid shot planned per DrBartko... now on FLECTOR Prn, LYRICA 50mg Tid, PERCOCET up to 3 per day... ~  12/10: note from [facility] reviewed- pain management. ~  5/11:  seen by [facility] for another shot, Vicodin changed to OXYCODONE. ~  She has been seen by DrDeveshwar for Rheum & she rec continued pain management by DrBartko...  OSTEOPENIA (ICD-733.90) - she takes Ca++, MVI, Vit D; prev on Alendronte & DrDeveshwar changed her to [facility] 12/12...  ~  BMD 6/08 at [facility] & showed TScores from -2.0  to -2.7, sl better than in 2005... ~  Vit D level 2/09 was 30 (30-90) and rec to take Vit D 1000 u daily supplement... ~  BMD 10/12 at [facility] showed TScore at lowest measured site= -4.3 in left forearm... ~  12/12:  DrDeveshwar Rx w/ FORTEO starting 12/12... ~  Labs 2/13 showed Vit D level = 32  ANXIETY (ICD-300.00) - she declines anxiolytic Rx despite her anxiety...  ANEMIA OF CHRONIC DISEASE (ICD-285.29) -  taking FEOSOL 200mg  Bid w/ VitC now & added Vit B12 starting 2/12. ~  labs 2/09 showed Hg= 10.4 & MCV= 90... ~  labs 3/10 showed Hg= 12.9 ~  labs 9/10 showed Hg= 12.0, Fe= 89 ~  labs 3/11 showed Hg= 10.7, Fe= 55, TIBC= 119, Sat=35%. ~  labs 6/11 showed Hg= 11.8, Fe= 100 (37%sat). ~  Labs 2/12 showed Hg= 11.8, Fe= 20 (8%sat), Vit B12= 139 ~  Labs 2/13 showed Hg= 12.1, Fe= 109 (45%sat), B12= 1373 ~  Labs 7/13 in hosp showed Hg= 11.3=>9.7 ~  Labs 8/13 showed Hg= 11.4, Fe=63 (26%sat), B12=1500 ~  Labs 4/14 showed Hg= 11.7, Fe=77 (31%sat) ~  Labs 10/14 showed Hg= 12.4, Fe=141 (54%sat)  VITAMIN B-12 DEFICIENCY>  She was supposed to have started on oral Vit B12 supplements 2/12... ~  Labs 2/12 showed Vit B12 level = 139... rec to start oral B12 supplement ~  daily... ~  2013:  Noted that pt stopped the B12 supplement on her own; B12 level 8/13 = 1500   Past Surgical History  Procedure Laterality Date  . Cataract surgery    . Abdominal hysterectomy    . Decompressive lumbar laminectomy  06/2008    at L2-3, L3-4 and L4-5 by Dr. Yetta Barre  . Laparoscopic cholecystectomy  04/2009    Dr. Odie Sera    Outpatient Encounter Prescriptions as of 12/05/2012  Medication Sig Dispense Refill  . aspirin 81 MG tablet Take 81 mg by mouth daily.        . calcium-vitamin D (OSCAL WITH D) 500-200 MG-UNIT per tablet Take 1 tablet by mouth daily.       . CELEBREX 200 MG capsule TAKE 1 CAPSULE DAILY AS NEEDED FOR JOINT PAIN  90 capsule  0  . Cholecalciferol (VITAMIN D) 1000 UNITS capsule Take 1,000  Units by mouth daily.        . diclofenac (FLECTOR) 1.3 % PTCH Place 1 patch onto the skin every 12 (twelve) hours. Apply as directed by Dr. Murray Hodgkins      . Ferrous Sulfate Dried (FEOSOL) 200 (65 FE) MG TABS Take 1 tablet by mouth 2 (two) times daily. Take with a vitamin c 500mg        . Fesoterodine Fumarate (TOVIAZ) 8 MG TB24 Take 8 mg by mouth daily. Per Dr. Patsi Sears       . fexofenadine (ALLEGRA) 180 MG tablet Take 180 mg by mouth daily. As needed for allergies        . NEXIUM 40 MG capsule TAKE 1 CAPSULE DAILY  90 capsule  1  . pregabalin (LYRICA) 50 MG capsule Take 1 capsule (50 mg total) by mouth 3 (three) times daily.  270 capsule  1  . simvastatin (ZOCOR) 40 MG tablet TAKE 1 TABLET AT BEDTIME  90 tablet  1  . FORTEO 600 MCG/2.4ML SOLN Inject 1 mL into the muscle Daily.       [name] oxyCODONE-acetaminophen (PERCOCET) 5-325 MG per tablet Take 1 tablet by mouth every 6 (six) hours as needed.       . [DISCONTINUED] levofloxacin (LEVAQUIN) 500 MG tablet Take 1 tablet (500 mg total) by mouth daily.  7 tablet  0   No facility-administered encounter medications on file as of 12/05/2012.    Allergies  Allergen Reactions  . Amoxicillin-Pot Clavulanate     REACTION: diarrhea  . Sulfonamide Derivatives     REACTION: unsure of reaction    Current Medications, Allergies, Past Medical History, Past Surgical History, Family History, and Social History were reviewed in [employer] record.    Review of Systems        See HPI - all other systems neg except as noted...       The patient complains of decreased hearing, dyspnea on exertion, incontinence, arthritis all over & difficulty walking.  The patient denies anorexia, fever, weight loss, weight gain, vision loss, hoarseness, chest pain, syncope, peripheral edema, prolonged cough, headaches, hemoptysis, abdominal pain, melena, hematochezia, severe indigestion/heartburn, hematuria, muscle weakness, suspicious skin lesions,  transient blindness, depression, unusual weight change, abnormal bleeding, enlarged lymph nodes, and angioedema.     Objective:   Physical Exam     WD, WN, 79 y/o WF in NAD... GENERAL:  Alert & oriented; pleasant & cooperative... HEENT:  [place]/AT, EOM-full, PERRLA, EACs-clear, TMs-wnl, NOSE-clear, THROAT-clear & wnl. NECK:  Supple w/ fairROM; no JVD; normal carotid impulses w/o bruits; no thyromegaly  or nodules palpated; no lymphadenopathy. CHEST:  Clear to P & A; without wheezes/ rales/ or rhonchi heard... HEART:  Regular Rhythm; without murmurs/ rubs/ or gallops detected... ABDOMEN:  Soft & sl tenderness, normal bowel sounds; no organomegaly or masses palpated...no guarding or rebound.  BACK:  s/p lumbar lam & scoliosis/ kyphosis EXT: without deformities, mod arthritic changes; no varicose veins/ +venous insuffic/ tr-1+ edema... NEURO:  CN's intact; no focal neuro deficits... DERM:  No lesions noted; no rash etc...  RADIOLOGY DATA:  Reviewed in the EPIC EMR & discussed w/ the patient...  LABORATORY DATA:  Reviewed in the EPIC EMR & discussed w/ the patient...   Assessment & Plan:    Hx of PNEUMONIA>  Adm 7/13 w/ CAP in LLL area, neg cults & treated w/ Roceph/Zithro IV, then Avelox orally; clinically improved & f/u CXR clear...  HBP>  Remains controlled on diet alone...  Ven Insuffic>  Reminded to avoid sodium, elev legs, support hose..  CHOL>  On Simva40 + diet;  F/u FLP looked good...  GI>  HH/ Dysphagia/ Divertics/ IBS/ Lymphocytic colitis>  Followed by DrDBrodie & stable, continue same...  GU>  UTIs/ Incontinence>  Managed by DrTannenbaum on Toviaz8...   DJD/ LBP/ Chronic Pain Syndrome>  This is her CC & main prob; she was referred to Rheum DrDeveshwar; already seeing Dr Murray Hodgkins for Pain Management, DrOlin & DrBeane for Ortho;  See meds above & continue same> she notes that Lyrica helps leg burning discomfort but still need 3 Percocet/d from [employer] due to back  pain...  Anxiety>  She declines anxiolytic meds...  ANEMIA>  Hx Fe & B12 deficient>  On oral supplements her CBC, Fe, B12 are all better...   Patient's Medications  New Prescriptions   No medications on file  Previous Medications   ASPIRIN 81 MG TABLET    Take 81 mg by mouth daily.     CALCIUM-VITAMIN D (OSCAL WITH D) 500-200 MG-UNIT PER TABLET    Take 1 tablet by mouth daily.    CELEBREX 200 MG CAPSULE    TAKE 1 CAPSULE DAILY AS NEEDED FOR JOINT PAIN   CHOLECALCIFEROL (VITAMIN D) 1000 UNITS CAPSULE    Take 1,000 Units by mouth daily.     DICLOFENAC (FLECTOR) 1.3 % PTCH    Place 1 patch onto the skin every 12 (twelve) hours. Apply as directed by Dr. Ardelle Balls SULFATE DRIED (FEOSOL) 200 (65 FE) MG TABS    Take 1 tablet by mouth 2 (two) times daily. Take with a vitamin c 500mg     FESOTERODINE FUMARATE (TOVIAZ) 8 MG TB24    Take 8 mg by mouth daily. Per Dr. Patsi Sears    FEXOFENADINE (ALLEGRA) 180 MG TABLET    Take 180 mg by mouth daily. As needed for allergies     FORTEO 600 MCG/2.4ML SOLN    Inject 1 mL into the muscle Daily.    NEXIUM 40 MG CAPSULE    TAKE 1 CAPSULE DAILY   OXYCODONE-ACETAMINOPHEN (PERCOCET) 5-325 MG PER TABLET    Take 1 tablet by mouth every 6 (six) hours as needed.    SIMVASTATIN (ZOCOR) 40 MG TABLET    TAKE 1 TABLET AT BEDTIME  Modified Medications   Modified Medication Previous Medication   PREGABALIN (LYRICA) 50 MG CAPSULE pregabalin (LYRICA) 50 MG capsule      Take 1 capsule (50 mg total) by mouth 3 (three) times daily.    Take 1 capsule (50 mg total) by mouth 3 (  three) times daily.  Discontinued Medications   LEVOFLOXACIN (LEVAQUIN) 500 MG TABLET    Take 1 tablet (500 mg total) by mouth daily.

## 2012-12-07 ENCOUNTER — Other Ambulatory Visit (INDEPENDENT_AMBULATORY_CARE_PROVIDER_SITE_OTHER): Payer: Medicare Other

## 2012-12-07 DIAGNOSIS — E7800 Pure hypercholesterolemia, unspecified: Secondary | ICD-10-CM

## 2012-12-07 DIAGNOSIS — K589 Irritable bowel syndrome without diarrhea: Secondary | ICD-10-CM

## 2012-12-07 DIAGNOSIS — D649 Anemia, unspecified: Secondary | ICD-10-CM

## 2012-12-07 LAB — LIPID PANEL
Cholesterol: 156 mg/dL (ref 0–200)
HDL: 85.9 mg/dL
LDL Cholesterol: 62 mg/dL (ref 0–99)
Total CHOL/HDL Ratio: 2
Triglycerides: 39 mg/dL (ref 0.0–149.0)
VLDL: 7.8 mg/dL (ref 0.0–40.0)

## 2012-12-07 LAB — CBC WITH DIFFERENTIAL/PLATELET
Basophils Relative: 0.2 % (ref 0.0–3.0)
Eosinophils Relative: 1.9 % (ref 0.0–5.0)
Lymphocytes Relative: 19.5 % (ref 12.0–46.0)
MCV: 91.3 fl (ref 78.0–100.0)
Monocytes Absolute: 0.7 10*3/uL (ref 0.1–1.0)
Monocytes Relative: 10.1 % (ref 3.0–12.0)
Neutrophils Relative %: 68.3 % (ref 43.0–77.0)
Platelets: 160 10*3/uL (ref 150.0–400.0)
RBC: 4.04 Mil/uL (ref 3.87–5.11)
WBC: 7.1 10*3/uL (ref 4.5–10.5)

## 2012-12-07 LAB — IBC PANEL: Iron: 141 ug/dL (ref 42–145)

## 2012-12-07 LAB — BASIC METABOLIC PANEL WITH GFR
BUN: 16 mg/dL (ref 6–23)
CO2: 29 meq/L (ref 19–32)
Calcium: 9.8 mg/dL (ref 8.4–10.5)
Chloride: 102 meq/L (ref 96–112)
Creatinine, Ser: 0.9 mg/dL (ref 0.4–1.2)
GFR: 61.94 mL/min
Glucose, Bld: 97 mg/dL (ref 70–99)
Potassium: 4.3 meq/L (ref 3.5–5.1)
Sodium: 141 meq/L (ref 135–145)

## 2012-12-08 NOTE — Progress Notes (Signed)
Quick Note:  Called, spoke with pt. Informed her of lab results and recs per Dr. Kriste Basque. She verbalized understanding and voiced no further questions or concerns at this time. ______

## 2013-01-05 ENCOUNTER — Other Ambulatory Visit: Payer: Self-pay | Admitting: Pulmonary Disease

## 2013-01-08 ENCOUNTER — Telehealth: Payer: Self-pay | Admitting: Pulmonary Disease

## 2013-01-08 MED ORDER — CEPHALEXIN 500 MG PO CAPS: ORAL_CAPSULE | ORAL | Status: DC

## 2013-01-08 NOTE — Telephone Encounter (Signed)
Per SN---  Ok to refill the cephalexin.  thanks

## 2013-01-08 NOTE — Telephone Encounter (Signed)
Rx has been sent in. Pt is aware. 

## 2013-01-08 NOTE — Telephone Encounter (Signed)
Pt is asking for a refill on cephalexin 500mg  take 4 tablets 1 hour prior to dental procedure. Pt states she had a filling fixed today and wants to have this on hand in case she has issues. Please advise if ok to refill. Carron Curie, CMA Allergies  Allergen Reactions  . Amoxicillin-Pot Clavulanate     REACTION: diarrhea  . Sulfonamide Derivatives     REACTION: unsure of reaction

## 2013-01-26 ENCOUNTER — Other Ambulatory Visit: Payer: Self-pay | Admitting: Pulmonary Disease

## 2013-02-09 ENCOUNTER — Telehealth: Payer: Self-pay | Admitting: Pulmonary Disease

## 2013-02-09 MED ORDER — CEPHALEXIN 500 MG PO CAPS: ORAL_CAPSULE | ORAL | Status: DC

## 2013-02-09 NOTE — Telephone Encounter (Signed)
Spoke with pt and verified that she has an upcoming dental appt and will need pre med of cephalexin.  Refill sent per pt request.

## 2013-02-15 ENCOUNTER — Telehealth: Payer: Self-pay | Admitting: Pulmonary Disease

## 2013-02-15 NOTE — Telephone Encounter (Signed)
Per SN---  Dr. Kriste Basque has reviewed her record.  Cathy [name] does not have any indication for prophylaxis prior to her dental procedures.   Cathy [name] does not need this medication based on new heart criteria.

## 2013-02-15 NOTE — Telephone Encounter (Signed)
I spoke with pt. She reports she is wanting a refill on cephalexin. She takes this prior to dental procedures. This was refilled 02/09/13 but pt reports she had 4 teeth pulled. Please advise SN thanks

## 2013-02-15 NOTE — Telephone Encounter (Signed)
Attempted to call pt but phone line is busy.  Will try back later.  

## 2013-02-16 NOTE — Telephone Encounter (Signed)
I called and made pt aware. Nothing further needed 

## 2013-03-22 ENCOUNTER — Telehealth: Payer: Self-pay | Admitting: Pulmonary Disease

## 2013-03-22 NOTE — Telephone Encounter (Signed)
I called and spoke with [facility] from [facility]. She reports pt lyrica needs a PA. The preferred alternative is gabapentin. Looking in pt past medication list pt has already tried this. I was then transferred to the PA dept. Spoke with Anuj (PA rep). The lyrica was approved x 1 year. Case # [id number] Nothing further needed

## 2013-04-27 ENCOUNTER — Telehealth: Payer: Self-pay | Admitting: Pulmonary Disease

## 2013-04-27 MED ORDER — ETODOLAC 400 MG PO TABS: 400.0000 mg | ORAL_TABLET | Freq: Two times a day (BID) | ORAL | Status: DC | PRN

## 2013-04-27 NOTE — Telephone Encounter (Signed)
Per SN---  Ok to change the celebrex 200 mg  To etodolac 400 mg   1  Po bid prn arthritis pain.  This has been sent to the pharmacy for the pt to be changed due to her formulary change.

## 2013-04-27 NOTE — Telephone Encounter (Signed)
I called and spoke w/ Gregary Signs. She reports celebre 200 mg is no longer covered under pt plan. The covered alternatives are naproxyn, etodolac, meloxicam and ibuprofen. Please advise SN thANKS  Allergies  Allergen Reactions  . Amoxicillin-Pot Clavulanate     REACTION: diarrhea  . Sulfonamide Derivatives     REACTION: unsure of reaction

## 2013-05-01 ENCOUNTER — Encounter: Payer: Self-pay | Admitting: Pulmonary Disease

## 2013-05-09 ENCOUNTER — Other Ambulatory Visit: Payer: Self-pay | Admitting: Pulmonary Disease

## 2013-05-09 DIAGNOSIS — I10 Essential (primary) hypertension: Secondary | ICD-10-CM

## 2013-06-05 ENCOUNTER — Ambulatory Visit: Payer: Medicare Other | Admitting: Pulmonary Disease

## 2013-06-11 ENCOUNTER — Ambulatory Visit (INDEPENDENT_AMBULATORY_CARE_PROVIDER_SITE_OTHER): Payer: Medicare Other | Admitting: Internal Medicine

## 2013-06-11 ENCOUNTER — Encounter: Payer: Self-pay | Admitting: Internal Medicine

## 2013-06-11 ENCOUNTER — Other Ambulatory Visit: Payer: Self-pay | Admitting: Pulmonary Disease

## 2013-06-11 VITALS — BP 142/80 | HR 75 | Temp 98.3°F | Wt 126.5 lb

## 2013-06-11 DIAGNOSIS — Z0000 Encounter for general adult medical examination without abnormal findings: Secondary | ICD-10-CM

## 2013-06-11 DIAGNOSIS — M810 Age-related osteoporosis without current pathological fracture: Secondary | ICD-10-CM

## 2013-06-11 DIAGNOSIS — N3281 Overactive bladder: Secondary | ICD-10-CM

## 2013-06-11 DIAGNOSIS — M545 Low back pain, unspecified: Secondary | ICD-10-CM

## 2013-06-11 DIAGNOSIS — N318 Other neuromuscular dysfunction of bladder: Secondary | ICD-10-CM

## 2013-06-11 DIAGNOSIS — J309 Allergic rhinitis, unspecified: Secondary | ICD-10-CM

## 2013-06-11 MED ORDER — FLUTICASONE PROPIONATE 50 MCG/ACT NA SUSP: 1.0000 | Freq: Every day | NASAL | Status: DC

## 2013-06-11 MED ORDER — DM-GUAIFENESIN ER 60-1200 MG PO TB12: 1.0000 | ORAL_TABLET | Freq: Two times a day (BID) | ORAL | Status: DC

## 2013-06-11 MED ORDER — AZITHROMYCIN 250 MG PO TABS: ORAL_TABLET | ORAL | Status: DC

## 2013-06-11 MED ORDER — PREGABALIN 50 MG PO CAPS: 50.0000 mg | ORAL_CAPSULE | Freq: Three times a day (TID) | ORAL | Status: DC

## 2013-06-11 MED ORDER — CELECOXIB 200 MG PO CAPS: 200.0000 mg | ORAL_CAPSULE | Freq: Two times a day (BID) | ORAL | Status: DC

## 2013-06-11 NOTE — Assessment & Plan Note (Addendum)
Chronic symptoms - no acute change Related to DDD - prior surgical intervention reviewed Follows with pain mgmt (Barko) for same Takes TID percocet (from [place]) and Lyrica - will refill lyrica today Also on daily celebrex prior to 2015 Trial of Lodine February 2015 prescribed for cost given formulary recommendations -ineffective pain relief We'll pursue prior authorization to resume Celebrex at this time

## 2013-06-11 NOTE — Progress Notes (Signed)
Pre visit review using our clinic review tool, if applicable. No additional management support is needed unless otherwise documented below in the visit note. 

## 2013-06-11 NOTE — Assessment & Plan Note (Addendum)
Seasonal symptoms - takes allegra for same rx nasal steroid with mucinex DM - also Zpak empirically Pt will call if worse or unimproved

## 2013-06-11 NOTE — Assessment & Plan Note (Signed)
Reports bone density being followed by rheumatology Reviewed last DEXA of record 2012: -4.3 Previously on Forteo, changed to prolia injections every 6 months January 2015 Continue weightbearing exercise as tolerated, followup with rheumatology as planned 

## 2013-06-11 NOTE — Progress Notes (Signed)
Subjective:    Patient ID: Cathy [name], female    DOB: 29-Jan-1928, 80 y.o.   MRN: [id number]  HPI  New to me, previously followed by Lenna Gilford.     Here for medicare wellness  Diet: heart healthy  Physical activity: fairly active Depression/mood screen: negative Hearing: intact to whispered voice Visual acuity: grossly normal, performs annual eye exam  ADLs: capable Fall risk: none Home safety: good Cognitive evaluation: intact to orientation, naming, recall and repetition EOL planning: adv directives, full code/ I agree  I have personally reviewed and have noted 1. The patient's medical and social history 2. Their use of alcohol, tobacco or illicit drugs 3. Their current medications and supplements 4. The patient's functional ability including ADL's, fall risks, home safety risks and hearing or visual impairment. 5. Diet and physical activities 6. Evidence for depression or mood disorders  Also, chronic medical issues also reviewed.   Allergic rhinitis - maintained on Allegra daily.  Pt c/o increasing sinusitis symptoms for the last week.  Postnasal drip, cough with occasional green sputum, slight fever at home.  Has taken Mucinex DM without improvement in symptoms.   Hypertension - not currently on medical therapy.  Low salt diet.  Denies CV symptoms  Hyperlipidemia - on statin, reports compliance. Denies adverse effects.  Urinary incontinence - followed by urology Gaynelle Arabian), currently on Vesicare.  Symptoms stable.   DJD/DDD - chronic lower back pain, followed by Dr Estanislado Pandy and Brien Few.  Pain stable currently with Oxycodone, Lyrica, and Celebrex (trial of Lodine since 04/2013 ineffective)  Past Medical History  Diagnosis Date  . Hypertension   . Venous insufficiency   . Hypercholesteremia   . Hiatal hernia   . Dysphagia   . Diverticulosis of colon   . Irritable bowel syndrome   . Colitis     lymphocytic colitis feb 2011  . Urinary incontinence   . DJD  (degenerative joint disease)   . Lumbar back pain   . Osteoporosis   . Anxiety   . Anemia   . Iron deficiency   . Vitamin B12 deficiency   . Allergic rhinitis 02/13/2007    Qualifier: Diagnosis of  By: Julien Girt CMA, Marliss Czar     Past Surgical History  Procedure Laterality Date  . Cataract surgery    . Abdominal hysterectomy    . Decompressive lumbar laminectomy  06/2008    at L2-3, L3-4 and L4-5 by Dr. Ronnald Ramp  . Laparoscopic cholecystectomy  04/2009    Dr. Abran Cantor   History reviewed. No pertinent family history. History   Social History  . Marital Status: Widowed    Spouse Name: N/A    Number of Children: N/A  . Years of Education: N/A   Occupational History  . retired    Social History Main Topics  . Smoking status: Former Smoker -- 0.50 packs/day for 10 years    Types: Cigarettes  . Smokeless tobacco: Never Used  . Alcohol Use: No  . Drug Use: No  . Sexual Activity: No   Other Topics Concern  . Not on file   Social History Narrative  . No narrative on file     Review of Systems  Constitutional: Negative for fever, chills, activity change, appetite change and unexpected weight change.  HENT: Positive for congestion, postnasal drip, rhinorrhea and sinus pressure. Negative for sore throat.   Respiratory: Positive for cough (occ productive w green sputum). Negative for choking, chest tightness and wheezing.   Cardiovascular: Negative for chest pain,  palpitations and leg swelling.  Gastrointestinal: Negative for nausea, vomiting, diarrhea and constipation.  Endocrine: Negative for cold intolerance and heat intolerance.  Genitourinary:       Chronic urinary incontinence  Musculoskeletal: Positive for back pain (chronic lower back pain).  Skin: Negative for rash and wound.  Neurological: Negative for dizziness, syncope, weakness and light-headedness.  Psychiatric/Behavioral: Negative for sleep disturbance. The patient is not nervous/anxious.   All other systems  reviewed and are negative.      Objective:   Physical Exam  Constitutional: She is oriented to person, place, and time. She appears well-developed and well-nourished. No distress.  HENT:  Head: Normocephalic and atraumatic.  Right Ear: External ear normal.  Left Ear: External ear normal.  Nose: Nose normal.  Mouth/Throat: Oropharynx is clear and moist. No oropharyngeal exudate.  Clear post nasal drip  Eyes: Conjunctivae are normal. Pupils are equal, round, and reactive to light. Right eye exhibits no discharge. Left eye exhibits no discharge. No scleral icterus.  Neck: Normal range of motion. Neck supple. No thyromegaly present.  Cardiovascular: Normal rate, regular rhythm and normal heart sounds.   No murmur heard. Pulmonary/Chest: Effort normal and breath sounds normal. No respiratory distress. She has no wheezes.  Abdominal: Soft. Bowel sounds are normal. She exhibits no distension. There is no tenderness.  Musculoskeletal: Normal range of motion. She exhibits no edema and no tenderness.  Significant scoliosis  Lymphadenopathy:    She has no cervical adenopathy.  Neurological: She is alert and oriented to person, place, and time.  Skin: Skin is warm and dry. No rash noted. She is not diaphoretic. No erythema.  Psychiatric: She has a normal mood and affect. Her behavior is normal. Judgment and thought content normal.     Wt Readings from Last 3 Encounters:  06/11/13 126 lb 8 oz (57.38 kg)  12/05/12 127 lb 6.4 oz (57.788 kg)  06/12/12 124 lb 9.6 oz (56.518 kg)   BP Readings from Last 3 Encounters:  06/11/13 142/80  12/05/12 124/78  06/12/12 140/78   Lab Results  Component Value Date   WBC 7.1 12/07/2012   HGB 12.4 12/07/2012   HCT 36.9 12/07/2012   PLT 160.0 12/07/2012   GLUCOSE 97 12/07/2012   CHOL 156 12/07/2012   TRIG 39.0 12/07/2012   HDL 85.90 12/07/2012   LDLDIRECT 158.1 05/29/2008   LDLCALC 62 12/07/2012   ALT 31 06/12/2012   AST 28 06/12/2012   NA 141 12/07/2012   K  4.3 12/07/2012   CL 102 12/07/2012   CREATININE 0.9 12/07/2012   BUN 16 12/07/2012   CO2 29 12/07/2012   TSH 1.19 06/12/2012   INR 1.33 03/09/2010       Assessment & Plan:   AWV/V70.0 - Today patient counseled on age appropriate routine health concerns for screening and prevention, each reviewed and up to date or declined. Immunizations reviewed and up to date or declined. Labs reviewed. Risk factors for depression reviewed and negative. Hearing function and visual acuity are intact. ADLs screened and addressed as needed. Functional ability and level of safety reviewed and appropriate. Education, counseling and referrals performed based on assessed risks today. Patient provided with a copy of personalized plan for preventive services.   Problem List Items Addressed This Visit   Allergic rhinitis     Seasonal symptoms - takes allegra for same rx nasal steroid with mucinex DM - also Zpak empirically Pt will call if worse or unimproved    BACK PAIN, LUMBAR  Chronic symptoms - no acute change Related to DDD - prior surgical intervention reviewed Follows with pain mgmt ([facility]) for same Takes TID percocet (from [facility]) and Lyrica - will refill lyrica today Also on daily celebrex prior to 2015 Trial of Lodine February 2015 prescribed for cost given formulary recommendations -ineffective pain relief We'll pursue prior authorization to resume Celebrex at this time    Relevant Medications      celecoxib (CELEBREX) capsule   Osteoporosis     Reports bone density being followed by rheumatology Reviewed last DEXA of record 2012: -4.3 Previously on Forteo, changed to prolia injections every 6 months January 2015 Continue weightbearing exercise as tolerated, followup with rheumatology as planned    Overactive bladder     Follows with urology for same Various anti-spasmatic trials in the past review Currently on Vesicare, continue same, no change recommended      Other Visit Diagnoses    Routine general medical examination at a health care facility    -  Primary

## 2013-06-11 NOTE — Patient Instructions (Addendum)
It was good to see you today.  We have reviewed your prior records including labs and tests today  Health Maintenance reviewed - all recommended immunizations and age-appropriate screenings are up-to-date.  Medications reviewed and updated And Flonase nose spray for allergies, continue Allegra daily and Mucinex twice daily. Also Z-Pak antibiotic for next 5 days to treat cough and congestion -Sent to local pharmacy  Will authorize use of Celebrex if insurance permits  no other changes recommended at this time.  Please schedule followup in 4 months for continued review, call sooner if problems. Health Maintenance, Female A healthy lifestyle and preventative care can promote health and wellness.  Maintain regular health, dental, and eye exams.  Eat a healthy diet. Foods like vegetables, fruits, whole grains, low-fat dairy products, and lean protein foods contain the nutrients you need without too many calories. Decrease your intake of foods high in solid fats, added sugars, and salt. Get information about a proper diet from your caregiver, if necessary.  Regular physical exercise is one of the most important things you can do for your health. Most adults should get at least 150 minutes of moderate-intensity exercise (any activity that increases your heart rate and causes you to sweat) each week. In addition, most adults need muscle-strengthening exercises on 2 or more days a week.   Maintain a healthy weight. The body mass index (BMI) is a screening tool to identify possible weight problems. It provides an estimate of body fat based on height and weight. Your caregiver can help determine your BMI, and can help you achieve or maintain a healthy weight. For adults 20 years and older:  A BMI below 18.5 is considered underweight.  A BMI of 18.5 to 24.9 is normal.  A BMI of 25 to 29.9 is considered overweight.  A BMI of 30 and above is considered obese.  Maintain normal blood lipids and  cholesterol by exercising and minimizing your intake of saturated fat. Eat a balanced diet with plenty of fruits and vegetables. Blood tests for lipids and cholesterol should begin at age 47 and be repeated every 5 years. If your lipid or cholesterol levels are high, you are over 50, or you are a high risk for heart disease, you may need your cholesterol levels checked more frequently.Ongoing high lipid and cholesterol levels should be treated with medicines if diet and exercise are not effective.  If you smoke, find out from your caregiver how to quit. If you do not use tobacco, do not start.  Lung cancer screening is recommended for adults aged 19 80 years who are at high risk for developing lung cancer because of a history of smoking. Yearly low-dose computed tomography (CT) is recommended for people who have at least a 30-pack-year history of smoking and are a current smoker or have quit within the past 15 years. A pack year of smoking is smoking an average of 1 pack of cigarettes a day for 1 year (for example: 1 pack a day for 30 years or 2 packs a day for 15 years). Yearly screening should continue until the smoker has stopped smoking for at least 15 years. Yearly screening should also be stopped for people who develop a health problem that would prevent them from having lung cancer treatment.  If you are pregnant, do not drink alcohol. If you are breastfeeding, be very cautious about drinking alcohol. If you are not pregnant and choose to drink alcohol, do not exceed 1 drink per day. One drink is  considered to be 12 ounces (355 mL) of beer, 5 ounces (148 mL) of wine, or 1.5 ounces (44 mL) of liquor.  Avoid use of street drugs. Do not share needles with anyone. Ask for help if you need support or instructions about stopping the use of drugs.  High blood pressure causes heart disease and increases the risk of stroke. Blood pressure should be checked at least every 1 to 2 years. Ongoing high blood  pressure should be treated with medicines, if weight loss and exercise are not effective.  If you are 26 to 78 years old, ask your caregiver if you should take aspirin to prevent strokes.  Diabetes screening involves taking a blood sample to check your fasting blood sugar level. This should be done once every 3 years, after age 47, if you are within normal weight and without risk factors for diabetes. Testing should be considered at a younger age or be carried out more frequently if you are overweight and have at least 1 risk factor for diabetes.  Breast cancer screening is essential preventative care for women. You should practice "breast self-awareness." This means understanding the normal appearance and feel of your breasts and may include breast self-examination. Any changes detected, no matter how small, should be reported to a caregiver. Women in their 28s and 30s should have a clinical breast exam (CBE) by a caregiver as part of a regular health exam every 1 to 3 years. After age 40, women should have a CBE every year. Starting at age 16, women should consider having a mammogram (breast X-ray) every year. Women who have a family history of breast cancer should talk to their caregiver about genetic screening. Women at a high risk of breast cancer should talk to their caregiver about having an MRI and a mammogram every year.  Breast cancer gene (BRCA)-related cancer risk assessment is recommended for women who have family members with BRCA-related cancers. BRCA-related cancers include breast, ovarian, tubal, and peritoneal cancers. Having family members with these cancers may be associated with an increased risk for harmful changes (mutations) in the breast cancer genes BRCA1 and BRCA2. Results of the assessment will determine the need for genetic counseling and BRCA1 and BRCA2 testing.  The Pap test is a screening test for cervical cancer. Women should have a Pap test starting at age 61. Between ages  61 and 69, Pap tests should be repeated every 2 years. Beginning at age 55, you should have a Pap test every 3 years as long as the past 3 Pap tests have been normal. If you had a hysterectomy for a problem that was not cancer or a condition that could lead to cancer, then you no longer need Pap tests. If you are between ages 46 and 77, and you have had normal Pap tests going back 10 years, you no longer need Pap tests. If you have had past treatment for cervical cancer or a condition that could lead to cancer, you need Pap tests and screening for cancer for at least 20 years after your treatment. If Pap tests have been discontinued, risk factors (such as a new sexual partner) need to be reassessed to determine if screening should be resumed. Some women have medical problems that increase the chance of getting cervical cancer. In these cases, your caregiver may recommend more frequent screening and Pap tests.  The human papillomavirus (HPV) test is an additional test that may be used for cervical cancer screening. The HPV test looks for the  virus that can cause the cell changes on the cervix. The cells collected during the Pap test can be tested for HPV. The HPV test could be used to screen women aged 47 years and older, and should be used in women of any age who have unclear Pap test results. After the age of 71, women should have HPV testing at the same frequency as a Pap test.  Colorectal cancer can be detected and often prevented. Most routine colorectal cancer screening begins at the age of 80 and continues through age 34. However, your caregiver may recommend screening at an earlier age if you have risk factors for colon cancer. On a yearly basis, your caregiver may provide home test kits to check for hidden blood in the stool. Use of a small camera at the end of a tube, to directly examine the colon (sigmoidoscopy or colonoscopy), can detect the earliest forms of colorectal cancer. Talk to your caregiver  about this at age 20, when routine screening begins. Direct examination of the colon should be repeated every 5 to 10 years through age 58, unless early forms of pre-cancerous polyps or small growths are found.  Hepatitis C blood testing is recommended for all people born from [birth year] through 1963 and any individual with known risks for hepatitis C.  Practice safe sex. Use condoms and avoid high-risk sexual practices to reduce the spread of sexually transmitted infections (STIs). Sexually active women aged 58 and younger should be checked for Chlamydia, which is a common sexually transmitted infection. Older women with new or multiple partners should also be tested for Chlamydia. Testing for other STIs is recommended if you are sexually active and at increased risk.  Osteoporosis is a disease in which the bones lose minerals and strength with aging. This can result in serious bone fractures. The risk of osteoporosis can be identified using a bone density scan. Women ages 47 and over and women at risk for fractures or osteoporosis should discuss screening with their caregivers. Ask your caregiver whether you should be taking a calcium supplement or vitamin D to reduce the rate of osteoporosis.  Menopause can be associated with physical symptoms and risks. Hormone replacement therapy is available to decrease symptoms and risks. You should talk to your caregiver about whether hormone replacement therapy is right for you.  Use sunscreen. Apply sunscreen liberally and repeatedly throughout the day. You should seek shade when your shadow is shorter than you. Protect yourself by wearing long sleeves, pants, a wide-brimmed hat, and sunglasses year round, whenever you are outdoors.  Notify your caregiver of new moles or changes in moles, especially if there is a change in shape or color. Also notify your caregiver if a mole is larger than the size of a pencil eraser.  Stay current with your  immunizations. Document Released: 08/31/2010 Document Revised: 06/12/2012 Document Reviewed: 08/31/2010 [facility] Patient Information 2014 [employer].

## 2013-06-11 NOTE — Assessment & Plan Note (Signed)
Follows with urology for same Various anti-spasmatic trials in the past review Currently on Vesicare, continue same, no change recommended

## 2013-06-18 ENCOUNTER — Other Ambulatory Visit: Payer: Self-pay | Admitting: *Deleted

## 2013-06-18 MED ORDER — ESOMEPRAZOLE MAGNESIUM 40 MG PO CPDR: DELAYED_RELEASE_CAPSULE | ORAL | Status: DC

## 2013-06-20 ENCOUNTER — Telehealth: Payer: Self-pay | Admitting: Internal Medicine

## 2013-06-20 NOTE — Telephone Encounter (Signed)
Ok to proceed with PA

## 2013-06-20 NOTE — Telephone Encounter (Signed)
[facility] is calling in regards to the pt's Celecoxib (CELEBREX) 200 MG prescription. Rx needs a PA.  Pharmacist says the patient has tried a generic: Etodolac (Lodine) 400mg  in the past but that her plan will cover any generic if she did not want to try for PA.   They are asking for a call back at ph#: [phone] for clarification on what we want to do.

## 2013-06-21 NOTE — Telephone Encounter (Signed)
Completed PA on cover my meds for the celebrex waiting on approval status...Cathy [name]

## 2013-06-22 NOTE — Telephone Encounter (Signed)
Received PA back med was approved. Case ID # [id number]...Cathy [name]

## 2013-07-14 ENCOUNTER — Emergency Department (HOSPITAL_COMMUNITY)
Admission: EM | Admit: 2013-07-14 | Discharge: 2013-07-14 | Disposition: A | Discharge status: Home / Self Care | Payer: Medicare Other | Attending: Emergency Medicine | Admitting: Emergency Medicine

## 2013-07-14 ENCOUNTER — Encounter (HOSPITAL_COMMUNITY): Payer: Self-pay | Admitting: Emergency Medicine

## 2013-07-14 DIAGNOSIS — Z8709 Personal history of other diseases of the respiratory system: Secondary | ICD-10-CM | POA: Insufficient documentation

## 2013-07-14 DIAGNOSIS — D649 Anemia, unspecified: Secondary | ICD-10-CM | POA: Insufficient documentation

## 2013-07-14 DIAGNOSIS — Z880 Allergy status to penicillin: Secondary | ICD-10-CM | POA: Insufficient documentation

## 2013-07-14 DIAGNOSIS — F411 Generalized anxiety disorder: Secondary | ICD-10-CM | POA: Insufficient documentation

## 2013-07-14 DIAGNOSIS — R110 Nausea: Secondary | ICD-10-CM | POA: Insufficient documentation

## 2013-07-14 DIAGNOSIS — Z79899 Other long term (current) drug therapy: Secondary | ICD-10-CM | POA: Insufficient documentation

## 2013-07-14 DIAGNOSIS — E7800 Pure hypercholesterolemia, unspecified: Secondary | ICD-10-CM | POA: Insufficient documentation

## 2013-07-14 DIAGNOSIS — IMO0002 Reserved for concepts with insufficient information to code with codable children: Secondary | ICD-10-CM | POA: Insufficient documentation

## 2013-07-14 DIAGNOSIS — I10 Essential (primary) hypertension: Secondary | ICD-10-CM | POA: Insufficient documentation

## 2013-07-14 DIAGNOSIS — Z791 Long term (current) use of non-steroidal anti-inflammatories (NSAID): Secondary | ICD-10-CM | POA: Insufficient documentation

## 2013-07-14 DIAGNOSIS — Z7982 Long term (current) use of aspirin: Secondary | ICD-10-CM | POA: Insufficient documentation

## 2013-07-14 DIAGNOSIS — Z8739 Personal history of other diseases of the musculoskeletal system and connective tissue: Secondary | ICD-10-CM | POA: Insufficient documentation

## 2013-07-14 DIAGNOSIS — K644 Residual hemorrhoidal skin tags: Secondary | ICD-10-CM | POA: Insufficient documentation

## 2013-07-14 DIAGNOSIS — Z792 Long term (current) use of antibiotics: Secondary | ICD-10-CM | POA: Insufficient documentation

## 2013-07-14 DIAGNOSIS — Z87891 Personal history of nicotine dependence: Secondary | ICD-10-CM | POA: Insufficient documentation

## 2013-07-14 DIAGNOSIS — K5641 Fecal impaction: Secondary | ICD-10-CM

## 2013-07-14 DIAGNOSIS — G8929 Other chronic pain: Secondary | ICD-10-CM | POA: Insufficient documentation

## 2013-07-14 MED ORDER — FLEET ENEMA 7-19 GM/118ML RE ENEM
1.0000 | ENEMA | Freq: Once | RECTAL | Status: AC
  Administered 2013-07-14: 1 via RECTAL
  Filled 2013-07-14: qty 1

## 2013-07-14 MED ORDER — SENNOSIDES-DOCUSATE SODIUM 8.6-50 MG PO TABS: 2.0000 | ORAL_TABLET | Freq: Every day | ORAL | Status: DC

## 2013-07-14 NOTE — ED Notes (Signed)
Patient states she took a laxative today due to chronic constipation. Her last BM was Thursday and she had difficulty then also. Patient feels like the stool is at the rectum but she is unable to pass the stool.

## 2013-07-14 NOTE — Discharge Instructions (Signed)
Constipation, Adult  Constipation is when a person:  · Poops (bowel movement) less than 3 times a week.  · Has a hard time pooping.  · Has poop that is dry, hard, or bigger than normal.  HOME CARE   · Eat more fiber, such as fruits, vegetables, whole grains like brown rice, and beans.  · Eat less fatty foods and sugar. This includes French fries, hamburgers, cookies, candy, and soda.  · If you are not getting enough fiber from food, take products with added fiber in them (supplements).  · Drink enough fluid to keep your pee (urine) clear or pale yellow.  · Go to the restroom when you feel like you need to poop. Do not hold it.  · Only take medicine as told by your doctor. Do not take medicines that help you poop (laxatives) without talking to your doctor first.  · Exercise on a regular basis, or as told by your doctor.  GET HELP RIGHT AWAY IF:   · You have bright red blood in your poop (stool).  · Your constipation lasts more than 4 days or gets worse.  · You have belly (abdomen) or butt (rectal) pain.  · You have thin poop (as thin as a pencil).  · You lose weight, and it cannot be explained.  MAKE SURE YOU:   · Understand these instructions.  · Will watch your condition.  · Will get help right away if you are not doing well or get worse.  Document Released: 08/04/2007 Document Revised: 05/10/2011 Document Reviewed: 11/27/2012  [employer]® Patient Information ©2014 [employer].

## 2013-07-14 NOTE — ED Provider Notes (Signed)
CSN: [id number]     Arrival date & time 07/14/13  2048 History   First MD Initiated Contact with Patient 07/14/13 2133     Chief Complaint  Patient presents with  . Constipation  . Rectal Pain     (Consider location/radiation/quality/duration/timing/severity/associated sxs/prior Treatment) HPI Patient presents with concerns of increasing rectal pain, lack of stool passage. The patient's last bowel movement was yesterday.  Today she has had increasing pain, with no relief in spite of using laxatives, stool softeners.  There is no associated fever, chills, vomiting, but there is anorexia. Patient has history of one prior fecal impaction in the distant past. Patient's chronic pain, requiring chronic narcotic use. She is not taking stool softeners chronically.  Past Medical History  Diagnosis Date  . Hypertension   . Venous insufficiency   . Hypercholesteremia   . Hiatal hernia   . Dysphagia   . Diverticulosis of colon   . Irritable bowel syndrome   . Colitis     lymphocytic colitis feb 2011  . Urinary incontinence   . DJD (degenerative joint disease)   . Lumbar back pain   . Osteoporosis   . Anxiety   . Anemia   . Iron deficiency   . Vitamin B12 deficiency   . Allergic rhinitis 02/13/2007    Qualifier: Diagnosis of  By: Julien Girt CMA, Marliss Czar     Past Surgical History  Procedure Laterality Date  . Cataract surgery    . Abdominal hysterectomy    . Decompressive lumbar laminectomy  06/2008    at L2-3, L3-4 and L4-5 by Dr. Ronnald Ramp  . Laparoscopic cholecystectomy  04/2009    Dr. Abran Cantor   History reviewed. No pertinent family history. History  Substance Use Topics  . Smoking status: Former Smoker -- 0.50 packs/day for 10 years    Types: Cigarettes  . Smokeless tobacco: Never Used  . Alcohol Use: No   OB History   Grav Para Term Preterm Abortions TAB SAB Ect Mult Living                 Review of Systems  Constitutional:       Per HPI, otherwise negative  HENT:   Per HPI, otherwise negative  Respiratory:       Per HPI, otherwise negative  Cardiovascular:       Per HPI, otherwise negative  Gastrointestinal: Positive for nausea and constipation. Negative for vomiting, abdominal pain and abdominal distention.  Endocrine:       Negative aside from HPI  Genitourinary:       Neg aside from HPI   Musculoskeletal:       Per HPI, otherwise negative  Skin: Negative for color change.  Neurological: Negative for syncope.      Allergies  Amoxicillin-pot clavulanate and Sulfonamide derivatives  Home Medications   Prior to Admission medications   Medication Sig Start Date End Date Taking? Authorizing Provider  aspirin 81 MG tablet Take 81 mg by mouth daily.      Historical Provider, MD  azithromycin (ZITHROMAX Z-PAK) 250 MG tablet Take 2 tablets (500 mg) on  Day 1,  followed by 1 tablet (250 mg) once daily on Days 2 through 5. 06/11/13   Rowe Clack, MD  calcium-vitamin D (OSCAL WITH D) 500-200 MG-UNIT per tablet Take 1 tablet by mouth daily.     Historical Provider, MD  celecoxib (CELEBREX) 200 MG capsule Take 1 capsule (200 mg total) by mouth 2 (two) times daily. 06/11/13  Rowe Clack, MD  cephALEXin (KEFLEX) 500 MG capsule Take 4 capsules 1 hour prior to dental procedure 02/09/13   Noralee Space, MD  Cholecalciferol (VITAMIN D) 1000 UNITS capsule Take 1,000 Units by mouth daily.      Historical Provider, MD  Dextromethorphan-Guaifenesin 60-1200 MG per 12 hr tablet Take 1 tablet by mouth every 12 (twelve) hours. 06/11/13   Rowe Clack, MD  diclofenac (FLECTOR) 1.3 % PTCH Place 1 patch onto the skin every 12 (twelve) hours. Apply as directed by Dr. Brien Few    Historical Provider, MD  esomeprazole (NEXIUM) 40 MG capsule TAKE 1 CAPSULE DAILY 06/18/13   Rowe Clack, MD  Ferrous Sulfate Dried (FEOSOL) 200 (65 FE) MG TABS Take 1 tablet by mouth 2 (two) times daily. Take with a vitamin c 500mg      Historical Provider, MD  Fesoterodine  Fumarate (TOVIAZ) 8 MG TB24 Take 8 mg by mouth daily. Per Dr. Gaynelle Arabian     Historical Provider, MD  fexofenadine (ALLEGRA) 180 MG tablet Take 180 mg by mouth daily. As needed for allergies      Historical Provider, MD  fluticasone (FLONASE) 50 MCG/ACT nasal spray Place 1 spray into both nostrils daily. 06/11/13   Rowe Clack, MD  FORTEO 600 MCG/2.4ML SOLN Inject 1 mL into the muscle Daily.  03/09/11   Historical Provider, MD  oxyCODONE-acetaminophen (PERCOCET) 5-325 MG per tablet Take 1 tablet by mouth every 6 (six) hours as needed.     Historical Provider, MD  pregabalin (LYRICA) 50 MG capsule Take 1 capsule (50 mg total) by mouth 3 (three) times daily. 06/11/13   Rowe Clack, MD  simvastatin (ZOCOR) 40 MG tablet TAKE 1 TABLET AT BEDTIME 01/05/13   Noralee Space, MD  solifenacin (VESICARE) 10 MG tablet Take by mouth daily.    Historical Provider, MD   BP 177/86  Pulse 73  Temp(Src) 99.3 F (37.4 C) (Oral)  Resp 20  Wt 125 lb (56.7 kg)  SpO2 92% Physical Exam  Nursing note and vitals reviewed. Constitutional: She is oriented to person, place, and time. She appears well-developed and well-nourished. She appears ill. No distress.  HENT:  Head: Normocephalic and atraumatic.  Eyes: Conjunctivae and EOM are normal.  Cardiovascular: Normal rate and regular rhythm.   Pulmonary/Chest: Effort normal and breath sounds normal. No stridor. No respiratory distress.  Abdominal: She exhibits no distension. There is no tenderness. There is no rigidity, no rebound and no guarding.  Genitourinary:    Musculoskeletal: She exhibits no edema.  Neurological: She is alert and oriented to person, place, and time. No cranial nerve deficit.  Skin: Skin is warm and dry.  Psychiatric: She has a normal mood and affect.    ED Course  Fecal disimpaction Date/Time: 07/14/2013 9:57 PM Performed by: Carmin Muskrat Authorized by: Carmin Muskrat Consent: Verbal consent obtained. The procedure was  performed in an emergent situation. Risks and benefits: risks, benefits and alternatives were discussed Consent given by: patient Patient understanding: patient states understanding of the procedure being performed Patient consent: the patient's understanding of the procedure matches consent given Procedure consent: procedure consent matches procedure scheduled Relevant documents: relevant documents present and verified Test results: test results available and properly labeled Site marked: the operative site was marked Imaging studies: imaging studies available Required items: required blood products, implants, devices, and special equipment available Patient identity confirmed: verbally with patient Preparation: Patient was prepped and draped in the usual sterile fashion. Local anesthesia  used: no Patient sedated: no Patient tolerance: Patient tolerated the procedure well with no immediate complications. Comments: With multiple passes of the index finger, substantial amounts of hard brown stool were retrieved.  10:46 PM The following enema after the initial disimpaction the patient had substantial stool production. MDM   Patient presents with fecal impaction.  After disimpaction, and the patient has correction of substantial amounts of stool, was resting comfortably.  Patient was discharged in stable condition with new bowel regimen, primary care followup.    Carmin Muskrat, MD 07/14/13 [phone]

## 2013-07-14 NOTE — ED Notes (Signed)
Large amount of stool felt on digital exam.

## 2013-08-08 ENCOUNTER — Other Ambulatory Visit: Payer: Self-pay | Admitting: *Deleted

## 2013-08-08 MED ORDER — SIMVASTATIN 40 MG PO TABS: ORAL_TABLET | ORAL | Status: DC

## 2013-08-28 ENCOUNTER — Telehealth: Payer: Self-pay | Admitting: *Deleted

## 2013-08-28 MED ORDER — LORATADINE 10 MG PO TABS: 10.0000 mg | ORAL_TABLET | Freq: Every day | ORAL | Status: DC

## 2013-08-28 NOTE — Telephone Encounter (Signed)
claritin daily in addition to flonase (can send flonase refill if needed) thanks

## 2013-08-28 NOTE — Telephone Encounter (Signed)
Pt states she has been having some sinus drainage. Wanting md to recommend something for her to take...Johny Chess

## 2013-08-28 NOTE — Telephone Encounter (Signed)
Called pt no answer x's 10 rings.../lmb 

## 2013-08-29 ENCOUNTER — Ambulatory Visit (INDEPENDENT_AMBULATORY_CARE_PROVIDER_SITE_OTHER): Payer: Medicare Other | Admitting: Internal Medicine

## 2013-08-29 ENCOUNTER — Encounter: Payer: Self-pay | Admitting: Internal Medicine

## 2013-08-29 VITALS — BP 125/80 | HR 58 | Temp 97.7°F | Wt 128.0 lb

## 2013-08-29 DIAGNOSIS — J302 Other seasonal allergic rhinitis: Secondary | ICD-10-CM

## 2013-08-29 DIAGNOSIS — J3089 Other allergic rhinitis: Secondary | ICD-10-CM

## 2013-08-29 MED ORDER — DM-GUAIFENESIN ER 60-1200 MG PO TB12: 1.0000 | ORAL_TABLET | Freq: Two times a day (BID) | ORAL | Status: DC

## 2013-08-29 MED ORDER — AZITHROMYCIN 250 MG PO TABS: ORAL_TABLET | ORAL | Status: DC

## 2013-08-29 MED ORDER — LORATADINE 10 MG PO TABS: 10.0000 mg | ORAL_TABLET | Freq: Every day | ORAL | Status: DC | PRN

## 2013-08-29 NOTE — Progress Notes (Signed)
   Subjective:    Patient ID: Cathy [name], female    DOB: January 02, 1928, 80 y.o.   MRN: [id number]  HPI  Patient is here for follow up - runny nose and "congestion" - green sputum in AM x 1 week (see phone notes) Also reviewed chronic medical issues and interval medical events  Past Medical History  Diagnosis Date  . Hypertension   . Venous insufficiency   . Hypercholesteremia   . Hiatal hernia   . Dysphagia   . Diverticulosis of colon   . Irritable bowel syndrome   . Colitis     lymphocytic colitis feb 2011  . Urinary incontinence   . DJD (degenerative joint disease)   . Lumbar back pain   . Osteoporosis   . Anxiety   . Anemia   . Iron deficiency   . Vitamin B12 deficiency   . Allergic rhinitis     Review of Systems  Constitutional: Negative for fever and fatigue.  Respiratory: Negative for cough, shortness of breath and wheezing.   Cardiovascular: Negative for chest pain and leg swelling.       Objective:   Physical Exam  BP 125/80  Pulse 58  Temp(Src) 97.7 F (36.5 C) (Oral)  Wt 128 lb (58.06 kg)  SpO2 95% Wt Readings from Last 3 Encounters:  08/29/13 128 lb (58.06 kg)  07/14/13 125 lb (56.7 kg)  06/11/13 126 lb 8 oz (57.38 kg)   Constitutional: She appears well-developed and well-nourished. No distress.  HENT: OP mild mild erythema and PND - audible throat clearing and cough with swallow Neck: Normal range of motion. Neck supple. No JVD present. No thyromegaly present.  Cardiovascular: Normal rate, regular rhythm and normal heart sounds.  No murmur heard. No BLE edema. Pulmonary/Chest: Effort normal and breath sounds normal. No respiratory distress. She has no wheezes.  Psychiatric: She has a normal mood and affect. Her behavior is normal. Judgment and thought content normal.   Lab Results  Component Value Date   WBC 7.1 12/07/2012   HGB 12.4 12/07/2012   HCT 36.9 12/07/2012   PLT 160.0 12/07/2012   GLUCOSE 97 12/07/2012   CHOL 156 12/07/2012   TRIG 39.0  12/07/2012   HDL 85.90 12/07/2012   LDLDIRECT 158.1 05/29/2008   LDLCALC 62 12/07/2012   ALT 31 06/12/2012   AST 28 06/12/2012   NA 141 12/07/2012   K 4.3 12/07/2012   CL 102 12/07/2012   CREATININE 0.9 12/07/2012   BUN 16 12/07/2012   CO2 29 12/07/2012   TSH 1.19 06/12/2012   INR 1.33 03/09/2010    No results found.     Assessment & Plan:   Problem List Items Addressed This Visit   Allergic rhinitis - Primary     Seasonal symptoms - takes allegra for same recommended change antihistamine to Claritin and continue nasal steroid with mucinex DM -  Also ok for Zpak empirically if symptoms worse given upcoming out of town travel Pt will call if unimprove

## 2013-08-29 NOTE — Progress Notes (Signed)
Pre visit review using our clinic review tool, if applicable. No additional management support is needed unless otherwise documented below in the visit note. 

## 2013-08-29 NOTE — Patient Instructions (Signed)
It was good to see you today.  Zpak antibiotics  - Your prescription(s) have been submitted to your pharmacy. Please take as directed and contact our office if you believe you are having problem(s) with the medication(s).  Change allegra to claritin and continue nose spray (flonase)  Make sure your are taking 1200mg  strength (extra strength) Mucinex DM twice daily, especially if increase in congestion  Hydrate, rest and call if worse or unimproved

## 2013-08-29 NOTE — Assessment & Plan Note (Signed)
Seasonal symptoms - takes allegra for same recommended change antihistamine to Claritin and continue nasal steroid with mucinex DM -  Also ok for Zpak empirically if symptoms worse given upcoming out of town travel Pt will call if unimprove

## 2013-08-29 NOTE — Telephone Encounter (Signed)
Called pt gave her md response. She stated she made appt to come in this am want to be check before she goes to the beach...Cathy [name]

## 2013-09-17 ENCOUNTER — Other Ambulatory Visit: Payer: Self-pay

## 2013-09-17 MED ORDER — PREGABALIN 50 MG PO CAPS: 50.0000 mg | ORAL_CAPSULE | Freq: Three times a day (TID) | ORAL | Status: DC

## 2013-10-15 ENCOUNTER — Ambulatory Visit: Payer: Medicare Other | Admitting: Internal Medicine

## 2013-11-14 ENCOUNTER — Encounter: Payer: Self-pay | Admitting: Internal Medicine

## 2013-12-10 ENCOUNTER — Ambulatory Visit (INDEPENDENT_AMBULATORY_CARE_PROVIDER_SITE_OTHER): Payer: Medicare Other | Admitting: Internal Medicine

## 2013-12-10 ENCOUNTER — Encounter: Payer: Self-pay | Admitting: Internal Medicine

## 2013-12-10 ENCOUNTER — Other Ambulatory Visit (INDEPENDENT_AMBULATORY_CARE_PROVIDER_SITE_OTHER): Payer: Medicare Other

## 2013-12-10 VITALS — BP 134/78 | HR 86 | Temp 98.1°F | Resp 14 | Ht 59.0 in | Wt 126.5 lb

## 2013-12-10 DIAGNOSIS — E7800 Pure hypercholesterolemia, unspecified: Secondary | ICD-10-CM

## 2013-12-10 DIAGNOSIS — Z23 Encounter for immunization: Secondary | ICD-10-CM

## 2013-12-10 DIAGNOSIS — G629 Polyneuropathy, unspecified: Secondary | ICD-10-CM

## 2013-12-10 LAB — LIPID PANEL
Cholesterol: 153 mg/dL (ref 0–200)
HDL: 67 mg/dL (ref >39.00)
LDL Cholesterol: 75 mg/dL (ref 0–99)
NONHDL: 86
Total CHOL/HDL Ratio: 2
Triglycerides: 53 mg/dL (ref 0.0–149.0)
VLDL: 10.6 mg/dL (ref 0.0–40.0)

## 2013-12-10 MED ORDER — PREGABALIN 75 MG PO CAPS: 75.0000 mg | ORAL_CAPSULE | Freq: Three times a day (TID) | ORAL | Status: DC

## 2013-12-10 NOTE — Assessment & Plan Note (Signed)
On simva Check annually and titrate as needed

## 2013-12-10 NOTE — Assessment & Plan Note (Signed)
Chronic symptoms - no acute change Related to DDD - prior surgical intervention reviewed Follows with pain mgmt (Barko) and rheum (Deveshwar) for same Takes TID percocet (from [place]) and Lyrica - will increase dose lyrica today Also on daily celebrex prior to 2015 but not approved by [place]/insurance Trial of [place] February 2015 prescribed for cost given formulary recommendations -ineffective pain relief Will consider topical NSAID at pt request in addition to Lyrica titration

## 2013-12-10 NOTE — Progress Notes (Signed)
Pre visit review using our clinic review tool, if applicable. No additional management support is needed unless otherwise documented below in the visit note. 

## 2013-12-10 NOTE — Patient Instructions (Signed)
It was good to see you today.  We have reviewed your prior records including labs and tests today  Your annual flu shot was given and/or updated today.  Test(s) ordered today. Your results will be released to [place] (or called to you) after review, usually within 72hours after test completion. If any changes need to be made, you will be notified at that same time.  Medications reviewed and updated Increase Lyrica to 75mg  three times daily for leg pain and arthritis symptoms  - no other changes recommended at this time.  Continue working with your arthritis and pain doctors as ongoing  Please schedule followup in 6 months, call sooner if problems.

## 2013-12-10 NOTE — Assessment & Plan Note (Signed)
  BP Readings from Last 3 Encounters:  12/10/13 134/78  08/29/13 125/80  07/14/13 161/72   The current medical regimen is effective;  continue present plan and medications.

## 2013-12-10 NOTE — Progress Notes (Signed)
Subjective:    Patient ID: Cathy [name], female    DOB: 25-Mar-1927, 80 y.o.   MRN: [id number]  HPI  Patient is here for follow up  Reviewed chronic medical issues and interval medical events  Past Medical History  Diagnosis Date  . Hypertension   . Venous insufficiency   . Hypercholesteremia   . Hiatal hernia   . Dysphagia   . Diverticulosis of colon   . Irritable bowel syndrome   . Colitis     lymphocytic colitis feb 2011  . Urinary incontinence   . DJD (degenerative joint disease)   . Lumbar back pain   . Osteoporosis   . Anxiety   . Anemia   . Iron deficiency   . Vitamin B12 deficiency   . Allergic rhinitis     Review of Systems  Constitutional: Positive for fatigue. Negative for fever.  Respiratory: Negative for cough and shortness of breath.   Cardiovascular: Negative for chest pain and leg swelling.  Neurological: Positive for numbness (burning pain BLE). Negative for weakness.       Objective:   Physical Exam  BP 146/78  Pulse 86  Temp(Src) 98.1 F (36.7 C) (Oral)  Resp 14  Ht 4\' 11"  (1.499 m)  Wt 126 lb 8 oz (57.38 kg)  BMI 25.54 kg/m2  SpO2 97% Wt Readings from Last 3 Encounters:  12/10/13 126 lb 8 oz (57.38 kg)  08/29/13 128 lb (58.06 kg)  07/14/13 125 lb (56.7 kg)   Constitutional: She appears well-developed and well-nourished. No distress.  Neck: Normal range of motion. Neck supple. No JVD present. No thyromegaly present.  Cardiovascular: Normal rate, regular rhythm and normal heart sounds.  No murmur heard. No BLE edema. Pulmonary/Chest: Effort normal and breath sounds normal. No respiratory distress. She has no wheezes.  Psychiatric: She has a normal mood and affect. Her behavior is normal. Judgment and thought content normal.   Lab Results  Component Value Date   WBC 7.1 12/07/2012   HGB 12.4 12/07/2012   HCT 36.9 12/07/2012   PLT 160.0 12/07/2012   GLUCOSE 97 12/07/2012   CHOL 156 12/07/2012   TRIG 39.0 12/07/2012   HDL 85.90  12/07/2012   LDLDIRECT 158.1 05/29/2008   LDLCALC 62 12/07/2012   ALT 31 06/12/2012   AST 28 06/12/2012   NA 141 12/07/2012   K 4.3 12/07/2012   CL 102 12/07/2012   CREATININE 0.9 12/07/2012   BUN 16 12/07/2012   CO2 29 12/07/2012   TSH 1.19 06/12/2012   INR 1.33 03/09/2010    No results found.     Assessment & Plan:   Problem List Items Addressed This Visit   HYPERCHOLESTEROLEMIA     On simva Check annually and titrate as needed    Relevant Orders      Lipid panel   Peripheral neuropathy - Primary     Chronic symptoms - no acute change Related to DDD - prior surgical intervention reviewed Follows with pain mgmt (Barko) and rheum (Deveshwar) for same Takes TID percocet (from [place]) and Lyrica - will increase dose lyrica today Also on daily celebrex prior to 2015 but not approved by [place]/insurance Trial of [place] February 2015 prescribed for cost given formulary recommendations -ineffective pain relief Will consider topical NSAID at pt request in addition to Lyrica titration    Relevant Medications      pregabalin (LYRICA) capsule    Other Visit Diagnoses   Need for prophylactic vaccination and inoculation against influenza  Relevant Orders       Flu vaccine HIGH DOSE PF (Fluzone Tri High dose) (Completed)

## 2014-01-21 LAB — HM DEXA SCAN

## 2014-02-26 ENCOUNTER — Other Ambulatory Visit: Payer: Self-pay

## 2014-02-26 ENCOUNTER — Telehealth: Payer: Self-pay

## 2014-02-26 MED ORDER — PREGABALIN 75 MG PO CAPS: 75.0000 mg | ORAL_CAPSULE | Freq: Three times a day (TID) | ORAL | Status: DC

## 2014-02-26 NOTE — Telephone Encounter (Signed)
rx faxed

## 2014-03-04 ENCOUNTER — Telehealth: Payer: Self-pay | Admitting: Internal Medicine

## 2014-03-04 MED ORDER — PREGABALIN 50 MG PO CAPS: 50.0000 mg | ORAL_CAPSULE | Freq: Three times a day (TID) | ORAL | Status: DC

## 2014-03-04 NOTE — Telephone Encounter (Signed)
Rx sent 

## 2014-03-04 NOTE — Telephone Encounter (Signed)
Pt called stated she need Lyrica 50 mg [facility], pt stated she did not want the 75 mg (verify 2 times with pt). Please advise, pt stated they going to put it back if they don't hear anything from the doctor office.

## 2014-03-14 ENCOUNTER — Telehealth: Payer: Self-pay | Admitting: Internal Medicine

## 2014-03-14 NOTE — Telephone Encounter (Signed)
Pt needs all medication refills sent to [facility] [phone] fax#

## 2014-03-14 NOTE — Telephone Encounter (Signed)
LVM for pt to call back.

## 2014-03-18 MED ORDER — ESOMEPRAZOLE MAGNESIUM 40 MG PO CPDR: 40.0000 mg | DELAYED_RELEASE_CAPSULE | Freq: Every day | ORAL | Status: DC

## 2014-03-18 MED ORDER — SOLIFENACIN SUCCINATE 10 MG PO TABS: 10.0000 mg | ORAL_TABLET | Freq: Every day | ORAL | Status: DC

## 2014-03-18 MED ORDER — SIMVASTATIN 40 MG PO TABS: 40.0000 mg | ORAL_TABLET | Freq: Every day | ORAL | Status: DC

## 2014-03-18 NOTE — Telephone Encounter (Signed)
Pt returned called

## 2014-03-18 NOTE — Telephone Encounter (Signed)
Spoke to pt and confirmed rx that she needed to have filled. Pharmacy list updated.

## 2014-03-19 ENCOUNTER — Telehealth: Payer: Self-pay | Admitting: Internal Medicine

## 2014-03-19 ENCOUNTER — Encounter: Payer: Self-pay | Admitting: Family

## 2014-03-19 ENCOUNTER — Ambulatory Visit (INDEPENDENT_AMBULATORY_CARE_PROVIDER_SITE_OTHER): Payer: Medicare Other | Admitting: Family

## 2014-03-19 VITALS — BP 170/82 | HR 57 | Temp 97.8°F | Resp 18 | Ht 59.0 in | Wt 124.0 lb

## 2014-03-19 DIAGNOSIS — J329 Chronic sinusitis, unspecified: Secondary | ICD-10-CM | POA: Insufficient documentation

## 2014-03-19 DIAGNOSIS — J019 Acute sinusitis, unspecified: Secondary | ICD-10-CM | POA: Insufficient documentation

## 2014-03-19 MED ORDER — DOXYCYCLINE HYCLATE 100 MG PO TABS: 100.0000 mg | ORAL_TABLET | Freq: Two times a day (BID) | ORAL | Status: DC

## 2014-03-19 NOTE — Telephone Encounter (Signed)
Tried to call twice and was hung up on. I have [facility] the [facility]. There was only one sent to [facility] recently. Needed clarification on the other rx's that were sent.

## 2014-03-19 NOTE — Assessment & Plan Note (Signed)
Symptoms and exam consistent with sinusitis. Start doxycycline. Continue over the counter medications as needed for symptom for relief. Follow up if symptoms worsen or fail to improve.

## 2014-03-19 NOTE — Progress Notes (Signed)
Pre visit review using our clinic review tool, if applicable. No additional management support is needed unless otherwise documented below in the visit note. 

## 2014-03-19 NOTE — Patient Instructions (Signed)
Thank you for choosing [facility].  Summary/Instructions:  Your prescription(s) have been submitted to your pharmacy or been printed and provided for you. Please take as directed and contact our office if you believe you are having problem(s) with the medication(s) or have any questions.  If your symptoms worsen or fail to improve, please contact our office for further instruction, or in case of emergency go directly to the emergency room at the closest medical facility.   General Recommendations:    Please drink plenty of fluids.  Get plenty of rest   Sleep in humidified air  Use saline nasal sprays  Netti pot   OTC Medications:  Decongestants - helps relieve congestion   Flonase (generic fluticasone) or Nasacort (generic triamcinolone) - please make sure to use the "cross-over" technique at a 45 degree angle towards the opposite eye as opposed to straight up the nasal passageway.   If you have HIGH BLOOD PRESSURE - Coricidin HBP; AVOID any product that is -D as this contains pseudoephedrine which may increase your blood pressure.  Afrin (oxymetazoline) every 6-8 hours for up to 3 days.   Allergies - helps relieve runny nose, itchy eyes and sneezing   Claritin (generic loratidine), Allegra (fexofenidine), or Zyrtec (generic cyrterizine) for runny nose. These medications should not cause drowsiness.  Note - Benadryl (generic diphenhydramine) may be used however may cause drowsiness  Cough -   Delsym or Robitussin (generic dextromethorphan)  Expectorants - helps loosen mucus to ease removal   Mucinex (generic guaifenesin) as directed on the package.  Headaches / General Aches   Tylenol (generic acetaminophen) - DO NOT EXCEED 3 grams (3,000 mg) in a 24 hour time period  Advil/Motrin (generic ibuprofen)   Sore Throat -   Salt water gargle   Chloraseptic (generic benzocaine) spray or lozenges / Sucrets (generic dyclonine)      

## 2014-03-19 NOTE — Assessment & Plan Note (Signed)
Symptoms and exam consistent with bacterial sinusitis. Start doxycycline. Continue over-the-counter medications as needed for symptom relief. Follow-up if symptoms worsen or fail to improve.

## 2014-03-19 NOTE — Progress Notes (Signed)
Subjective:    Patient ID: Cathy [name], female    DOB: March 14, 1927, 81 y.o.   MRN: [id number]  Chief Complaint  Patient presents with  . Cough    drainage, productive cough, congestion, runny nose, worse at night and in the mornings x3 days    HPI:  Cathy [name] is a 81 y.o. female who presents today for an acute visit.  Acute illness started about 3 days ago with the associated symptoms of drainage, productive purulent cough, congestion, and runny nose. The timing of her symptoms is worse at night and the mornings. Modifying factors of Mucinex appears to have made the congestion looser. Denies any recent antibiotic use.    Allergies  Allergen Reactions  . Amoxicillin-Pot Clavulanate     REACTION: diarrhea  . Sulfonamide Derivatives     REACTION: unsure of reaction    Current Outpatient Prescriptions on File Prior to Visit  Medication Sig Dispense Refill  . aspirin 81 MG tablet Take 81 mg by mouth daily.      . calcium-vitamin D (OSCAL WITH D) 500-200 MG-UNIT per tablet Take 1 tablet by mouth daily.     . Cholecalciferol (VITAMIN D) 1000 UNITS capsule Take 1,000 Units by mouth daily.      [name] Dextromethorphan-Guaifenesin 60-1200 MG per 12 hr tablet Take 1 tablet by mouth every 12 (twelve) hours. 20 tablet 0  . diclofenac (FLECTOR) 1.3 % PTCH Place 1 patch onto the skin every 12 (twelve) hours. Apply as directed by Dr. Brien Few    . esomeprazole (NEXIUM) 40 MG capsule Take 1 capsule (40 mg total) by mouth daily. 90 capsule 1  . Ferrous Sulfate Dried (FEOSOL) 200 (65 FE) MG TABS Take 1 tablet by mouth 2 (two) times daily. Take with a vitamin c 500mg      . fluticasone (FLONASE) 50 MCG/ACT nasal spray Place 1 spray into both nostrils daily. 16 g 2  . loratadine (CLARITIN) 10 MG tablet Take 1 tablet (10 mg total) by mouth daily as needed for allergies. 30 tablet 2  . oxyCODONE-acetaminophen (PERCOCET) 5-325 MG per tablet Take 1 tablet by mouth every 6 (six) hours as needed for moderate  pain.     . pregabalin (LYRICA) 50 MG capsule Take 1 capsule (50 mg total) by mouth 3 (three) times daily. 90 capsule 1  . senna-docusate (SENOKOT-S) 8.6-50 MG per tablet Take 2 tablets by mouth daily. 30 tablet 0  . simvastatin (ZOCOR) 40 MG tablet Take 1 tablet (40 mg total) by mouth at bedtime. 90 tablet 1  . solifenacin (VESICARE) 10 MG tablet Take 1 tablet (10 mg total) by mouth daily. 90 tablet 1   No current facility-administered medications on file prior to visit.    Review of Systems  Constitutional: Negative for fever and chills.  HENT: Positive for congestion, postnasal drip, rhinorrhea and sinus pressure.   Respiratory: Positive for cough.   Neurological: Negative for headaches.      Objective:    BP 170/82 mmHg  Pulse 57  Temp(Src) 97.8 F (36.6 C) (Oral)  Resp 18  Ht 4\' 11"  (1.499 m)  Wt 124 lb (56.246 kg)  BMI 25.03 kg/m2  SpO2 97% Nursing note and vital signs reviewed.  Physical Exam  Constitutional: She is oriented to person, place, and time. She appears well-developed and well-nourished. No distress.  HENT:  Right Ear: Hearing, tympanic membrane, external ear and ear canal normal.  Left Ear: Hearing, tympanic membrane, external ear and ear canal normal.  Nose: Right sinus exhibits maxillary sinus tenderness and frontal sinus tenderness. Left sinus exhibits maxillary sinus tenderness and frontal sinus tenderness.  Mouth/Throat: Uvula is midline, oropharynx is clear and moist and mucous membranes are normal.  Eyes: Conjunctivae and EOM are normal. Pupils are equal, round, and reactive to light.  Cardiovascular: Normal rate, regular rhythm, normal heart sounds and intact distal pulses.   Pulmonary/Chest: Effort normal and breath sounds normal.  Neurological: She is alert and oriented to person, place, and time.  Skin: Skin is warm and dry.  Psychiatric: She has a normal mood and affect. Her behavior is normal. Judgment and thought content normal.         Assessment & Plan:

## 2014-03-19 NOTE — Telephone Encounter (Signed)
Pt's grand daughter call stated [name] told our office that her [name] is her drug store and we send in 3 Rx in for her. [facility]), [facility]

## 2014-04-10 ENCOUNTER — Telehealth: Payer: Self-pay | Admitting: *Deleted

## 2014-04-10 ENCOUNTER — Telehealth: Payer: Self-pay | Admitting: Internal Medicine

## 2014-04-10 ENCOUNTER — Other Ambulatory Visit: Payer: Self-pay

## 2014-04-10 MED ORDER — GABAPENTIN 300 MG PO CAPS: 300.0000 mg | ORAL_CAPSULE | Freq: Three times a day (TID) | ORAL | Status: DC

## 2014-04-10 NOTE — Telephone Encounter (Signed)
Pt informed that PA will be started in the morning.

## 2014-04-10 NOTE — Telephone Encounter (Signed)
I called pt back. Gave pt the number to [facility] that I called for the PA confirmation at [phone].   I called the number given by the pt for more information on Lyrica.   Speaking to the pharmicist Loraine Grip.  The gabapentin was being sent b/c a verbal okay was given to change from the lyrica to the gabapentin.  Pharmacist is going to try and stop the shipment and charging to the pt for the medication.   I will not be able to start the PA until tomorrow. This is because the insurance has already been ran with gabapentin. This is the reason that the Lyrica came back with a PA not needing approval. Once the gabapentin is retracted the lyrica will require a PA.   I will start another PA for Lyrica.  [phone] number for the PA.

## 2014-04-10 NOTE — Telephone Encounter (Signed)
There is a phone note already open regarding this. Pt has been called multiple times today regarding same.  Closing this note.

## 2014-04-10 NOTE — Telephone Encounter (Signed)
Left msg on triage stating having an issue with mail order sending her lyrica, has spoken with them today and they are suppose to be shipping out med. Insurance has ok for pt to get a 30 day fill at [facility] her local pharmacy until she receive mail order...Johny Chess

## 2014-04-10 NOTE — Telephone Encounter (Signed)
Patient would like you to call [phone] to speak with Cathy [name] about her lyrica.  Patient states she did not receive a phone call back in regards.

## 2014-04-10 NOTE — Telephone Encounter (Signed)
Called pt back.   PA for lyrica was started to day via cover my meds via [employer].  Plan sent a message back stating that it does not require a PA.   Informed pt that and she insisted that it does.  Called plan to verify PA requirement and reinitiate if necessary.   Confirmed that Lyrica did not need a PA.  Pt informed. [facility] is processing and sending rx to pt.

## 2014-04-10 NOTE — Telephone Encounter (Signed)
States she was waiting on Lyrica from [facility].  She states she called [facility] and they are sending her gabapentin.  She would like a call in regards.

## 2014-04-11 ENCOUNTER — Other Ambulatory Visit: Payer: Self-pay

## 2014-04-11 MED ORDER — PREGABALIN 75 MG PO CAPS: 75.0000 mg | ORAL_CAPSULE | Freq: Three times a day (TID) | ORAL | Status: DC

## 2014-04-11 NOTE — Telephone Encounter (Signed)
PA has been started on the phone. PA was denied.   Called the member services to [place]. They transferred me 4 different before the automated system told me to call the toll free number again and hung up.  I called the number again, they told me the pt is not covered under them ID given. Medicare part B and part D is their drug coverage.   I called Medicare and was told that they could not help me because I was not the member.   Called pt and asked for her help in contacting Medicare. I need any information about how to get her rx covered.

## 2014-04-12 ENCOUNTER — Telehealth: Payer: Self-pay | Admitting: Internal Medicine

## 2014-04-12 ENCOUNTER — Telehealth: Payer: Self-pay

## 2014-04-12 MED ORDER — PREGABALIN 50 MG PO CAPS: 50.0000 mg | ORAL_CAPSULE | Freq: Three times a day (TID) | ORAL | Status: DC

## 2014-04-12 NOTE — Telephone Encounter (Signed)
Dustin contacted me and stated that [name] called back stating that the Lyrica was reduced to the 50 mg capsules. I called [name] back and pt grand dtr stated that the lyrica was reduced to the 50mg  due to pt age and pt agreed at the visit to reduce.   Pharmacy contacted regarding.

## 2014-04-12 NOTE — Addendum Note (Signed)
Addended by: Lowella Dandy on: 04/12/2014 09:21 AM   Modules accepted: Orders, Medications

## 2014-04-12 NOTE — Telephone Encounter (Signed)
Patient's great-granddaughter called stating [facility] is sending her medication but it will be 5-7 days. She is completely out and needs a 30 day supply sent to [facility] on [address]. If any questions, call [place] @ [phone]

## 2014-04-12 NOTE — Telephone Encounter (Signed)
Checking on lyrica dosage

## 2014-04-12 NOTE — Telephone Encounter (Signed)
Resubmitted PA for lyrica 50 mg via cover my meds. PA approved.  Pharmacy contacted  [name] contacted

## 2014-04-12 NOTE — Telephone Encounter (Signed)
Spoke to grand dtr [name].

## 2014-04-12 NOTE — Telephone Encounter (Signed)
Spoke to [place] she stated that they were given an approval code for Lyrica. Phoned in rx to [place].  Informed [name] of the same.

## 2014-04-25 ENCOUNTER — Encounter: Payer: Self-pay | Admitting: Family

## 2014-04-25 ENCOUNTER — Ambulatory Visit (INDEPENDENT_AMBULATORY_CARE_PROVIDER_SITE_OTHER): Payer: Medicare Other | Admitting: Family

## 2014-04-25 VITALS — BP 180/80 | HR 56 | Temp 98.3°F | Wt 129.0 lb

## 2014-04-25 DIAGNOSIS — R600 Localized edema: Secondary | ICD-10-CM

## 2014-04-25 MED ORDER — HYDROCHLOROTHIAZIDE 12.5 MG PO CAPS: 12.5000 mg | ORAL_CAPSULE | Freq: Every day | ORAL | Status: DC

## 2014-04-25 NOTE — Patient Instructions (Addendum)
Thank you for choosing [facility].  Summary/Instructions:  Elevate your legs when not standing  Decrease salt in your diet  Use compression socks as needed.   Your prescription(s) have been submitted to your pharmacy or been printed and provided for you. Please take as directed and contact our office if you believe you are having problem(s) with the medication(s) or have any questions.  If your symptoms worsen or fail to improve, please contact our office for further instruction, or in case of emergency go directly to the emergency room at the closest medical facility.    Peripheral Edema You have swelling in your legs (peripheral edema). This swelling is due to excess accumulation of salt and water in your body. Edema may be a sign of heart, kidney or liver disease, or a side effect of a medication. It may also be due to problems in the leg veins. Elevating your legs and using special support stockings may be very helpful, if the cause of the swelling is due to poor venous circulation. Avoid long periods of standing, whatever the cause. Treatment of edema depends on identifying the cause. Chips, pretzels, pickles and other salty foods should be avoided. Restricting salt in your diet is almost always needed. Water pills (diuretics) are often used to remove the excess salt and water from your body via urine. These medicines prevent the kidney from reabsorbing sodium. This increases urine flow. Diuretic treatment may also result in lowering of potassium levels in your body. Potassium supplements may be needed if you have to use diuretics daily. Daily weights can help you keep track of your progress in clearing your edema. You should call your caregiver for follow up care as recommended. SEEK IMMEDIATE MEDICAL CARE IF:   You have increased swelling, pain, redness, or heat in your legs.  You develop shortness of breath, especially when lying down.  You develop chest or abdominal pain,  weakness, or fainting.  You have a fever. Document Released: 03/25/2004 Document Revised: 05/10/2011 Document Reviewed: 03/05/2009 [facility] Patient Information 2015 [place], [place]. This information is not intended to replace advice given to you by your health care provider. Make sure you discuss any questions you have with your health care provider.

## 2014-04-25 NOTE — Assessment & Plan Note (Signed)
Symptoms and exam reveal lower extremity edema. Low concern for blood clot with no redness or calf pain and patient's frequent movement. Given continued increase in blood pressure readings during office visits, will start hydrochlorothiazide for both edema and blood pressure. Patient instructed to keep her legs elevated, decreased salt in her diet, and wear compression stockings as needed. Follow-up in 2 weeks for blood pressure and edema check.

## 2014-04-25 NOTE — Progress Notes (Signed)
Pre visit review using our clinic review tool, if applicable. No additional management support is needed unless otherwise documented below in the visit note. 

## 2014-04-25 NOTE — Progress Notes (Signed)
Subjective:    Patient ID: Cathy [name], female    DOB: 1927-05-18, 81 y.o.   MRN: [id number]  Chief Complaint  Patient presents with  . Leg Swelling    Bilateral    L>R    HPI:  [name] is a 81 y.o. female who presents today for an acute visit.  This is a new problem. Associated symptoms of leg swelling has been going on for about a week. Denies any modifying factors. Denies calf pain or pain with walking. Denies trauma to the area. Timing of the edema is better in the morning and progressively worsen over the course of the day.    Allergies  Allergen Reactions  . Amoxicillin-Pot Clavulanate     REACTION: diarrhea  . Sulfonamide Derivatives     REACTION: unsure of reaction    Current Outpatient Prescriptions on File Prior to Visit  Medication Sig Dispense Refill  . aspirin 81 MG tablet Take 81 mg by mouth daily.      . calcium-vitamin D (OSCAL WITH D) 500-200 MG-UNIT per tablet Take 1 tablet by mouth daily.     . Cholecalciferol (VITAMIN D) 1000 UNITS capsule Take 1,000 Units by mouth daily.      [name] Dextromethorphan-Guaifenesin 60-1200 MG per 12 hr tablet Take 1 tablet by mouth every 12 (twelve) hours. 20 tablet 0  . diclofenac (FLECTOR) 1.3 % PTCH Place 1 patch onto the skin every 12 (twelve) hours. Apply as directed by Dr. Brien Few    . doxycycline (VIBRA-TABS) 100 MG tablet Take 1 tablet (100 mg total) by mouth 2 (two) times daily. 20 tablet 0  . esomeprazole (NEXIUM) 40 MG capsule Take 1 capsule (40 mg total) by mouth daily. 90 capsule 1  . Ferrous Sulfate Dried (FEOSOL) 200 (65 FE) MG TABS Take 1 tablet by mouth 2 (two) times daily. Take with a vitamin c 500mg      . fluticasone (FLONASE) 50 MCG/ACT nasal spray Place 1 spray into both nostrils daily. 16 g 2  . loratadine (CLARITIN) 10 MG tablet Take 1 tablet (10 mg total) by mouth daily as needed for allergies. 30 tablet 2  . oxyCODONE-acetaminophen (PERCOCET) 5-325 MG per tablet Take 1 tablet by mouth every 6 (six)  hours as needed for moderate pain.     . pregabalin (LYRICA) 50 MG capsule Take 1 capsule (50 mg total) by mouth 3 (three) times daily. 90 capsule 0  . senna-docusate (SENOKOT-S) 8.6-50 MG per tablet Take 2 tablets by mouth daily. 30 tablet 0  . simvastatin (ZOCOR) 40 MG tablet Take 1 tablet (40 mg total) by mouth at bedtime. 90 tablet 1  . solifenacin (VESICARE) 10 MG tablet Take 1 tablet (10 mg total) by mouth daily. 90 tablet 1   No current facility-administered medications on file prior to visit.    Review of Systems  Constitutional: Negative for fever and chills.  Respiratory: Negative for cough, chest tightness and shortness of breath.   Cardiovascular: Positive for leg swelling. Negative for chest pain and palpitations.      Objective:    BP 180/80 mmHg  Pulse 56  Temp(Src) 98.3 F (36.8 C) (Oral)  Wt 129 lb (58.514 kg)  SpO2 95%  Nursing note and vital signs reviewed.  Physical Exam  Constitutional: She is oriented to person, place, and time. She appears well-developed and well-nourished. No distress.  Cardiovascular: Normal rate, regular rhythm, normal heart sounds and intact distal pulses.   1+ pitting edema noted bilaterally  Pulmonary/Chest: Effort normal and breath sounds normal.  Neurological: She is alert and oriented to person, place, and time.  Skin: Skin is warm and dry.  Psychiatric: She has a normal mood and affect. Her behavior is normal. Judgment and thought content normal.       Assessment & Plan:

## 2014-04-26 ENCOUNTER — Ambulatory Visit: Payer: Medicare Other | Admitting: Internal Medicine

## 2014-05-02 ENCOUNTER — Other Ambulatory Visit: Payer: Self-pay | Admitting: Internal Medicine

## 2014-05-02 NOTE — Telephone Encounter (Signed)
Called Barbara and lvm for her to call back.   MD will be back in on Wednesday.

## 2014-05-02 NOTE — Telephone Encounter (Signed)
Pt called in and said that said that she was suppose to have a 90 day supply [facility] with the [facility] on [facility]/12. She is request a 90 day supple of pregabalin (LYRICA) 50 MG capsule [[id number]]  To be sent [facility]

## 2014-05-03 NOTE — Telephone Encounter (Signed)
Agree - I will sign on my return Mon Thanks!

## 2014-05-03 NOTE — Telephone Encounter (Signed)
Spoke to [place] and informed rx will be filled when MD comes back into the office.

## 2014-05-04 ENCOUNTER — Other Ambulatory Visit: Payer: Self-pay | Admitting: Internal Medicine

## 2014-05-06 ENCOUNTER — Other Ambulatory Visit: Payer: Self-pay | Admitting: Internal Medicine

## 2014-05-06 MED ORDER — PREGABALIN 50 MG PO CAPS: 50.0000 mg | ORAL_CAPSULE | Freq: Three times a day (TID) | ORAL | Status: DC

## 2014-05-06 NOTE — Telephone Encounter (Signed)
rx printed and signed by md. F[facility]ed to [facility].

## 2014-05-09 ENCOUNTER — Other Ambulatory Visit (INDEPENDENT_AMBULATORY_CARE_PROVIDER_SITE_OTHER): Payer: Medicare Other

## 2014-05-09 ENCOUNTER — Encounter: Payer: Self-pay | Admitting: Family

## 2014-05-09 ENCOUNTER — Ambulatory Visit (INDEPENDENT_AMBULATORY_CARE_PROVIDER_SITE_OTHER): Payer: Medicare Other | Admitting: Family

## 2014-05-09 VITALS — BP 140/74 | HR 59 | Temp 98.1°F | Resp 18 | Wt 125.1 lb

## 2014-05-09 DIAGNOSIS — R600 Localized edema: Secondary | ICD-10-CM

## 2014-05-09 LAB — COMPREHENSIVE METABOLIC PANEL
ALT: 19 U/L (ref 0–35)
AST: 23 U/L (ref 0–37)
Albumin: 3.8 g/dL (ref 3.5–5.2)
Alkaline Phosphatase: 52 U/L (ref 39–117)
BILIRUBIN TOTAL: 0.4 mg/dL (ref 0.2–1.2)
BUN: 13 mg/dL (ref 6–23)
CALCIUM: 9.5 mg/dL (ref 8.4–10.5)
CO2: 34 meq/L — AB (ref 19–32)
Chloride: 97 mEq/L (ref 96–112)
Creatinine, Ser: 0.71 mg/dL (ref 0.40–1.20)
GFR: 83.24 mL/min (ref >60.00)
Glucose, Bld: 85 mg/dL (ref 70–99)
Potassium: 4.2 mEq/L (ref 3.5–5.1)
Sodium: 133 mEq/L — ABNORMAL LOW (ref 135–145)
Total Protein: 6.2 g/dL (ref 6.0–8.3)

## 2014-05-09 NOTE — Assessment & Plan Note (Signed)
Lower extremity edema is improved with hydrochlorothiazide. Given mild increase in edema still, increase hydrochlorothiazide to 25 mg for the next 3 days and then decrease back to 12.5 mg daily. Continue to elevate leg as needed. Low concern for blood clot given no tenderness or redness. If no improvement noted will obtain ultrasound to rule out underlying pathology.

## 2014-05-09 NOTE — Patient Instructions (Signed)
Thank you for choosing [facility].  Summary/Instructions:  Increase your HCTZ to 2 pills per day for the next 3 days and then return to 1 pill daily.   If your symptoms worsen or fail to improve, please contact our office for further instruction, or in case of emergency go directly to the emergency room at the closest medical facility.   Edema Edema is an abnormal buildup of fluids in your bodytissues. Edema is somewhatdependent on gravity to pull the fluid to the lowest place in your body. That makes the condition more common in the legs and thighs (lower extremities). Painless swelling of the feet and ankles is common and becomes more likely as you get older. It is also common in looser tissues, like around your eyes.  When the affected area is squeezed, the fluid may move out of that spot and leave a dent for a few moments. This dent is called pitting.  CAUSES  There are many possible causes of edema. Eating too much salt and being on your feet or sitting for a long time can cause edema in your legs and ankles. Hot weather may make edema worse. Common medical causes of edema include:  Heart failure.  Liver disease.  Kidney disease.  Weak blood vessels in your legs.  Cancer.  An injury.  Pregnancy.  Some medications.  Obesity. SYMPTOMS  Edema is usually painless.Your skin may look swollen or shiny.  DIAGNOSIS  Your health care provider may be able to diagnose edema by asking about your medical history and doing a physical exam. You may need to have tests such as X-rays, an electrocardiogram, or blood tests to check for medical conditions that may cause edema.  TREATMENT  Edema treatment depends on the cause. If you have heart, liver, or kidney disease, you need the treatment appropriate for these conditions. General treatment may include:  Elevation of the affected body part above the level of your heart.  Compression of the affected body part. Pressure from  elastic bandages or support stockings squeezes the tissues and forces fluid back into the blood vessels. This keeps fluid from entering the tissues.  Restriction of fluid and salt intake.  Use of a water pill (diuretic). These medications are appropriate only for some types of edema. They pull fluid out of your body and make you urinate more often. This gets rid of fluid and reduces swelling, but diuretics can have side effects. Only use diuretics as directed by your health care provider. HOME CARE INSTRUCTIONS   Keep the affected body part above the level of your heart when you are lying down.   Do not sit still or stand for prolonged periods.   Do not put anything directly under your knees when lying down.  Do not wear constricting clothing or garters on your upper legs.   Exercise your legs to work the fluid back into your blood vessels. This may help the swelling go down.   Wear elastic bandages or support stockings to reduce ankle swelling as directed by your health care provider.   Eat a low-salt diet to reduce fluid if your health care provider recommends it.   Only take medicines as directed by your health care provider. SEEK MEDICAL CARE IF:   Your edema is not responding to treatment.  You have heart, liver, or kidney disease and notice symptoms of edema.  You have edema in your legs that does not improve after elevating them.   You have sudden and unexplained  weight gain. SEEK IMMEDIATE MEDICAL CARE IF:   You develop shortness of breath or chest pain.   You cannot breathe when you lie down.  You develop pain, redness, or warmth in the swollen areas.   You have heart, liver, or kidney disease and suddenly get edema.  You have a fever and your symptoms suddenly get worse. MAKE SURE YOU:   Understand these instructions.  Will watch your condition.  Will get help right away if you are not doing well or get worse. Document Released: 02/15/2005 Document  Revised: 07/02/2013 Document Reviewed: 12/08/2012 [facility] Patient Information 2015 [place], [place]. This information is not intended to replace advice given to you by your health care provider. Make sure you discuss any questions you have with your health care provider.

## 2014-05-09 NOTE — Progress Notes (Signed)
Pre visit review using our clinic review tool, if applicable. No additional management support is needed unless otherwise documented below in the visit note. 

## 2014-05-09 NOTE — Progress Notes (Signed)
Subjective:    Patient ID: Cathy [name], female    DOB: Jul 14, 1927, 81 y.o.   MRN: [id number]  Chief Complaint  Patient presents with  . Follow-up    says she still is having swelling in her left leg, the medication hasn't improved it much since last visit    HPI:  Cathy [name] is a 81 y.o. female who presents today for follow up of blood pressure and lower leg edema.   She was previously started on HCTZ to help manage her blood pressure and lower leg edema. Notes she continues to experience the associated symptom of edema in her left thigh. Notes that her lower leg swelling has improved as well as her blood pressure.   Allergies  Allergen Reactions  . Amoxicillin-Pot Clavulanate     REACTION: diarrhea  . Sulfonamide Derivatives     REACTION: unsure of reaction    Current Outpatient Prescriptions on File Prior to Visit  Medication Sig Dispense Refill  . aspirin 81 MG tablet Take 81 mg by mouth daily.      . calcium-vitamin D (OSCAL WITH D) 500-200 MG-UNIT per tablet Take 1 tablet by mouth daily.     . Cholecalciferol (VITAMIN D) 1000 UNITS capsule Take 1,000 Units by mouth daily.      [name] Dextromethorphan-Guaifenesin 60-1200 MG per 12 hr tablet Take 1 tablet by mouth every 12 (twelve) hours. 20 tablet 0  . diclofenac (FLECTOR) 1.3 % PTCH Place 1 patch onto the skin every 12 (twelve) hours. Apply as directed by Dr. Brien Few    . esomeprazole (NEXIUM) 40 MG capsule Take 1 capsule (40 mg total) by mouth daily. 90 capsule 1  . Ferrous Sulfate Dried (FEOSOL) 200 (65 FE) MG TABS Take 1 tablet by mouth 2 (two) times daily. Take with a vitamin c 500mg      . hydrochlorothiazide (MICROZIDE) 12.5 MG capsule Take 1 capsule (12.5 mg total) by mouth daily. 30 capsule 0  . oxyCODONE-acetaminophen (PERCOCET) 5-325 MG per tablet Take 1 tablet by mouth every 6 (six) hours as needed for moderate pain.     . pregabalin (LYRICA) 50 MG capsule Take 1 capsule (50 mg total) by mouth 3 (three) times daily.  270 capsule 1  . senna-docusate (SENOKOT-S) 8.6-50 MG per tablet Take 2 tablets by mouth daily. 30 tablet 0  . simvastatin (ZOCOR) 40 MG tablet Take 1 tablet (40 mg total) by mouth at bedtime. 90 tablet 1  . solifenacin (VESICARE) 10 MG tablet Take 1 tablet (10 mg total) by mouth daily. 90 tablet 1   No current facility-administered medications on file prior to visit.     Review of Systems  Constitutional: Negative for fever and chills.  Respiratory: Negative for chest tightness and shortness of breath.   Cardiovascular: Positive for leg swelling. Negative for chest pain and palpitations.      Objective:    BP 140/74 mmHg  Pulse 59  Temp(Src) 98.1 F (36.7 C) (Oral)  Resp 18  Wt 125 lb 1.9 oz (56.754 kg)  SpO2 97% Nursing note and vital signs reviewed.  Physical Exam  Constitutional: She is oriented to person, place, and time. She appears well-developed and well-nourished. No distress.  Cardiovascular: Normal rate, regular rhythm, normal heart sounds and intact distal pulses.   Mild edema of left thigh present compared to right. No redness or discoloration noted. Non-tender to touch.   Pulmonary/Chest: Effort normal and breath sounds normal.  Neurological: She is alert and oriented to  person, place, and time.  Skin: Skin is warm and dry.  Psychiatric: She has a normal mood and affect. Her behavior is normal. Judgment and thought content normal.       Assessment & Plan:

## 2014-05-10 ENCOUNTER — Encounter: Payer: Self-pay | Admitting: Family

## 2014-06-17 ENCOUNTER — Ambulatory Visit (INDEPENDENT_AMBULATORY_CARE_PROVIDER_SITE_OTHER): Payer: Medicare Other | Admitting: Internal Medicine

## 2014-06-17 ENCOUNTER — Encounter: Payer: Self-pay | Admitting: Internal Medicine

## 2014-06-17 ENCOUNTER — Other Ambulatory Visit (INDEPENDENT_AMBULATORY_CARE_PROVIDER_SITE_OTHER): Payer: Medicare Other

## 2014-06-17 VITALS — BP 128/68 | HR 63 | Temp 97.6°F | Ht 59.0 in | Wt 127.8 lb

## 2014-06-17 DIAGNOSIS — R600 Localized edema: Secondary | ICD-10-CM | POA: Diagnosis not present

## 2014-06-17 DIAGNOSIS — Z23 Encounter for immunization: Secondary | ICD-10-CM | POA: Diagnosis not present

## 2014-06-17 DIAGNOSIS — M810 Age-related osteoporosis without current pathological fracture: Secondary | ICD-10-CM | POA: Diagnosis not present

## 2014-06-17 DIAGNOSIS — G629 Polyneuropathy, unspecified: Secondary | ICD-10-CM

## 2014-06-17 DIAGNOSIS — D508 Other iron deficiency anemias: Secondary | ICD-10-CM

## 2014-06-17 DIAGNOSIS — Z0000 Encounter for general adult medical examination without abnormal findings: Secondary | ICD-10-CM | POA: Diagnosis not present

## 2014-06-17 DIAGNOSIS — E538 Deficiency of other specified B group vitamins: Secondary | ICD-10-CM

## 2014-06-17 LAB — CBC WITH DIFFERENTIAL/PLATELET
Basophils Absolute: 0 10*3/uL (ref 0.0–0.1)
Basophils Relative: 0 % (ref 0.0–3.0)
EOS ABS: 0.1 10*3/uL (ref 0.0–0.7)
Eosinophils Relative: 1.6 % (ref 0.0–5.0)
HEMATOCRIT: 35.9 % — AB (ref 36.0–46.0)
Hemoglobin: 12.2 g/dL (ref 12.0–15.0)
Lymphocytes Relative: 14.8 % (ref 12.0–46.0)
Lymphs Abs: 1.2 10*3/uL (ref 0.7–4.0)
MCHC: 33.9 g/dL (ref 30.0–36.0)
MCV: 91.3 fl (ref 78.0–100.0)
MONO ABS: 0.7 10*3/uL (ref 0.1–1.0)
Monocytes Relative: 9 % (ref 3.0–12.0)
NEUTROS PCT: 74.6 % (ref 43.0–77.0)
Neutro Abs: 5.8 10*3/uL (ref 1.4–7.7)
PLATELETS: 189 10*3/uL (ref 150.0–400.0)
RBC: 3.93 Mil/uL (ref 3.87–5.11)
RDW: 13.5 % (ref 11.5–15.5)
WBC: 7.8 10*3/uL (ref 4.0–10.5)

## 2014-06-17 LAB — FERRITIN: Ferritin: 879.3 ng/mL — ABNORMAL HIGH (ref 10.0–291.0)

## 2014-06-17 LAB — VITAMIN B12: Vitamin B-12: >1500 pg/mL — ABNORMAL HIGH (ref 211–911)

## 2014-06-17 MED ORDER — SOLIFENACIN SUCCINATE 10 MG PO TABS: 10.0000 mg | ORAL_TABLET | Freq: Every day | ORAL | Status: DC

## 2014-06-17 MED ORDER — HYDROCHLOROTHIAZIDE 12.5 MG PO CAPS: 12.5000 mg | ORAL_CAPSULE | Freq: Every day | ORAL | Status: DC

## 2014-06-17 MED ORDER — VITAMIN B-12 1000 MCG PO TABS: 1000.0000 ug | ORAL_TABLET | Freq: Every day | ORAL | Status: AC

## 2014-06-17 MED ORDER — SIMVASTATIN 40 MG PO TABS: 40.0000 mg | ORAL_TABLET | Freq: Every day | ORAL | Status: DC

## 2014-06-17 NOTE — Patient Instructions (Signed)
It was good to see you today.  We have reviewed your prior records including labs and tests today  Prevnar and tetanus immunizations updated today. Other Health Maintenance reviewed and all other recommended immunizations and age-appropriate screenings are up-to-date.  Test(s) ordered today. Your results will be released to [place] (or called to you) after review, usually within 72hours after test completion. If any changes need to be made, you will be notified at that same time.  Medications reviewed and updated Continue hydrochlorothiazide daily for swelling as needed, no other changes recommended at this time.  Your prescription(s)/refills have been submitted to your pharmacy. Please take as directed and contact our office if you believe you are having problem(s) with the medication(s).  Please schedule followup in 6 months for semiannual exam and labs, call sooner if problems. We will work to establish you with a new primary care physician who has an afternoon availability as discussed

## 2014-06-17 NOTE — Assessment & Plan Note (Signed)
Left thigh swelling noted 04/2014 - improved with HCTZ as prescribed - now normalized Denies hip pain or trauma - continue HCTZ (refill)

## 2014-06-17 NOTE — Progress Notes (Signed)
Pre visit review using our clinic review tool, if applicable. No additional management support is needed unless otherwise documented below in the visit note. 

## 2014-06-17 NOTE — Assessment & Plan Note (Signed)
Hx iron deficiency and B12 deficiency (?if orally replacing same) taking FEOSOL 200mg  Bid w/ VitC now & added Vit B12 starting 2/12. GI eval - flex sig 04/2009 with lymphocytic colitis (resolved w/ entercort) and prior colo 2003 with divertic ~ labs 2/09 showed Hg= 10.4 & MCV= 90... ~ labs 3/10 showed Hg= 12.9 ~ labs 9/10 showed Hg= 12.0, Fe= 89 ~ labs 3/11 showed Hg= 10.7, Fe= 55, TIBC= 119, Sat=35%. ~ labs 6/11 showed Hg= 11.8, Fe= 100 (37%sat). ~ Labs 2/12 showed Hg= 11.8, Fe= 20 (8%sat), Vit B12= 139 ~ Labs 2/13 showed Hg= 12.1, Fe= 109 (45%sat), B12= 1373 ~ Labs 7/13 in hosp showed Hg= 11.3=>9.7 ~ Labs 8/13 showed Hg= 11.4, Fe=63 (26%sat), B12=1500   ~ Labs 2/12 showed Vit B12 level = 139... rec to start oral B12 supplement ~1060mcg daily...  Lab Results  Component Value Date   IRON 141 12/07/2012   TIBC 117* 08/29/2008   FERRITIN 390* 08/29/2008   Lab Results  Component Value Date   WBC 7.1 12/07/2012   HGB 12.4 12/07/2012   HCT 36.9 12/07/2012   MCV 91.3 12/07/2012   PLT 160.0 12/07/2012   Lab Results  Component Value Date   VITAMINB12 >1500* 10/18/2011

## 2014-06-17 NOTE — Assessment & Plan Note (Signed)
Chronic symptoms in BLE - no acute change Related to DDD - prior surgical intervention reviewed Follows with pain mgmt (Barko) and rheum (Deveshwar) for same Takes TID percocet (from [employer]) and Lyrica -  Also on daily celebrex prior to 2015 but not approved by PA/insurance Trial of [place] February 2015 prescribed for cost given formulary recommendations -ineffective pain relief Will consider topical NSAID prn at pt request in addition to Lyrica titration

## 2014-06-17 NOTE — Progress Notes (Signed)
Subjective:    Patient ID: Cathy [name], female    DOB: 01-11-1928, 81 y.o.   MRN: [id number]  HPI   Here for medicare wellness  Diet: heart healthy  Physical activity: sedentary, uses cane outside of the home Depression/mood screen: negative Hearing: intact to whispered voice Visual acuity: grossly normal, performs annual eye exam  ADLs: capable Fall risk: none Home safety: good Cognitive evaluation: intact to orientation, naming, recall and repetition EOL planning: adv directives, full code/ I agree  I have personally reviewed and have noted 1. The patient's medical and social history 2. Their use of alcohol, tobacco or illicit drugs 3. Their current medications and supplements 4. The patient's functional ability including ADL's, fall risks, home safety risks and hearing or visual impairment. 5. Diet and physical activities 6. Evidence for depression or mood disorders  Also reviewed chronic medical issues, interval events and current concerns   Past Medical History  Diagnosis Date  . Hypertension   . Venous insufficiency   . Hypercholesteremia   . Hiatal hernia   . Dysphagia   . Diverticulosis of colon   . Irritable bowel syndrome   . Colitis     lymphocytic colitis feb 2011  . Urinary incontinence   . DJD (degenerative joint disease)   . Lumbar back pain   . Osteoporosis   . Anxiety   . Anemia   . Iron deficiency   . Vitamin B12 deficiency   . Allergic rhinitis    History reviewed. No pertinent family history. History  Substance Use Topics  . Smoking status: Former Smoker -- 0.50 packs/day for 10 years    Types: Cigarettes  . Smokeless tobacco: Never Used  . Alcohol Use: No    Review of Systems  Constitutional: Negative for fatigue and unexpected weight change.  Respiratory: Negative for cough, shortness of breath and wheezing.   Cardiovascular: Positive for leg swelling (L thigh, not ankle - improved with HCTZ as prescribed 04/2014 - needs  refill). Negative for chest pain and palpitations.  Gastrointestinal: Negative for nausea, abdominal pain, diarrhea, constipation and abdominal distention.  Musculoskeletal: Negative for back pain and arthralgias.  Neurological: Negative for dizziness, weakness, light-headedness and headaches.  Psychiatric/Behavioral: Negative for dysphoric mood. The patient is not nervous/anxious.   All other systems reviewed and are negative.      Objective:    Physical Exam  Constitutional: She appears well-developed and well-nourished. No distress.  Cardiovascular: Normal rate, regular rhythm and normal heart sounds.   No murmur heard. Pulmonary/Chest: Effort normal and breath sounds normal. No respiratory distress.  Musculoskeletal: She exhibits no edema.    BP 128/68 mmHg  Pulse 63  Temp(Src) 97.6 F (36.4 C) (Oral)  Ht 4\' 11"  (1.499 m)  Wt 127 lb 12 oz (57.947 kg)  BMI 25.79 kg/m2  SpO2 93% Wt Readings from Last 3 Encounters:  06/17/14 127 lb 12 oz (57.947 kg)  05/09/14 125 lb 1.9 oz (56.754 kg)  04/25/14 129 lb (58.514 kg)     Lab Results  Component Value Date   WBC 7.1 12/07/2012   HGB 12.4 12/07/2012   HCT 36.9 12/07/2012   PLT 160.0 12/07/2012   GLUCOSE 85 05/09/2014   CHOL 153 12/10/2013   TRIG 53.0 12/10/2013   HDL 67.00 12/10/2013   LDLDIRECT 158.1 05/29/2008   LDLCALC 75 12/10/2013   ALT 19 05/09/2014   AST 23 05/09/2014   NA 133* 05/09/2014   K 4.2 05/09/2014   CL 97 05/09/2014  CREATININE 0.71 05/09/2014   BUN 13 05/09/2014   CO2 34* 05/09/2014   TSH 1.19 06/12/2012   INR 1.33 03/09/2010    No results found.     Assessment & Plan:   AWV/z00.00 - Today patient counseled on age appropriate routine health concerns for screening and prevention, each reviewed and up to date or declined. Immunizations reviewed and up to date or declined. Labs ordered and reviewed. Risk factors for depression reviewed and negative. Hearing function and visual acuity are  intact. ADLs screened and addressed as needed. Functional ability and level of safety reviewed and appropriate. Education, counseling and referrals performed based on assessed risks today. Patient provided with a copy of personalized plan for preventive services.   Problem List Items Addressed This Visit    Anemia    Hx iron deficiency and B12 deficiency (?if orally replacing same) taking FEOSOL 200mg  Bid w/ VitC now & added Vit B12 starting 2/12. GI eval - flex sig 04/2009 with lymphocytic colitis (resolved w/ entercort) and prior colo 2003 with divertic ~ labs 2/09 showed Hg= 10.4 & MCV= 90... ~ labs 3/10 showed Hg= 12.9 ~ labs 9/10 showed Hg= 12.0, Fe= 89 ~ labs 3/11 showed Hg= 10.7, Fe= 55, TIBC= 119, Sat=35%. ~ labs 6/11 showed Hg= 11.8, Fe= 100 (37%sat). ~ Labs 2/12 showed Hg= 11.8, Fe= 20 (8%sat), Vit B12= 139 ~ Labs 2/13 showed Hg= 12.1, Fe= 109 (45%sat), B12= 1373 ~ Labs 7/13 in hosp showed Hg= 11.3=>9.7 ~ Labs 8/13 showed Hg= 11.4, Fe=63 (26%sat), B12=1500   ~ Labs 2/12 showed Vit B12 level = 139... rec to start oral B12 supplement ~1031mcg daily...  Lab Results  Component Value Date   IRON 141 12/07/2012   TIBC 117* 08/29/2008   FERRITIN 390* 08/29/2008   Lab Results  Component Value Date   WBC 7.1 12/07/2012   HGB 12.4 12/07/2012   HCT 36.9 12/07/2012   MCV 91.3 12/07/2012   PLT 160.0 12/07/2012   Lab Results  Component Value Date   VITAMINB12 >1500* 10/18/2011         Relevant Medications   vitamin B-12 (CYANOCOBALAMIN) 1000 MCG tablet   Other Relevant Orders   CBC with Differential/Platelet   Vitamin B12   Ferritin   Lower extremity edema    Left thigh swelling noted 04/2014 - improved with HCTZ as prescribed - now normalized Denies hip pain or trauma - continue HCTZ (refill)       Relevant Orders   CBC with Differential/Platelet   Vitamin B12   Ferritin   TSH   Osteoporosis    Reports bone density being followed by  rheumatology Reviewed last DEXA of record 2012: -4.3 Previously on Forteo, changed to prolia injections every 6 months January 2015 Continue weightbearing exercise as tolerated, followup with rheumatology as planned      Relevant Medications   PROLIA 60 MG/ML SOLN injection   Peripheral neuropathy    Chronic symptoms in BLE - no acute change Related to DDD - prior surgical intervention reviewed Follows with pain mgmt (Barko) and rheum (Deveshwar) for same Takes TID percocet (from [facility]) and Lyrica -  Also on daily celebrex prior to 2015 but not approved by [place]/insurance Trial of [place] February 2015 prescribed for cost given formulary recommendations -ineffective pain relief Will consider topical NSAID prn at pt request in addition to Lyrica titration      Relevant Orders   CBC with Differential/Platelet   TSH    Other Visit Diagnoses  Routine general medical examination at a health care facility    -  Primary        Gwendolyn Grant, MD

## 2014-06-17 NOTE — Assessment & Plan Note (Signed)
Reports bone density being followed by rheumatology Reviewed last DEXA of record 2012: -4.3 Previously on Forteo, changed to prolia injections every 6 months January 2015 Continue weightbearing exercise as tolerated, followup with rheumatology as planned

## 2014-06-18 ENCOUNTER — Telehealth: Payer: Self-pay | Admitting: Internal Medicine

## 2014-06-18 NOTE — Telephone Encounter (Signed)
Stefannie already confirmed Dr. Jenny Reichmann to take patient on.  Tried to call patient.  Ring no VM.  Will try again.

## 2014-06-18 NOTE — Telephone Encounter (Signed)
Patient is requesting to switch to Dr. Jenny Reichmann from Dr. Asa Lente.  Please advise.

## 2014-07-04 ENCOUNTER — Other Ambulatory Visit: Payer: Self-pay | Admitting: Internal Medicine

## 2014-07-23 ENCOUNTER — Other Ambulatory Visit: Payer: Self-pay | Admitting: Internal Medicine

## 2014-10-21 ENCOUNTER — Emergency Department (HOSPITAL_COMMUNITY)
Admission: EM | Admit: 2014-10-21 | Discharge: 2014-10-22 | Disposition: A | Discharge status: Home / Self Care | Payer: Medicare Other | Attending: Emergency Medicine | Admitting: Emergency Medicine

## 2014-10-21 ENCOUNTER — Encounter (HOSPITAL_COMMUNITY): Payer: Self-pay

## 2014-10-21 ENCOUNTER — Telehealth: Payer: Self-pay | Admitting: Internal Medicine

## 2014-10-21 DIAGNOSIS — Z7982 Long term (current) use of aspirin: Secondary | ICD-10-CM | POA: Insufficient documentation

## 2014-10-21 DIAGNOSIS — Z880 Allergy status to penicillin: Secondary | ICD-10-CM | POA: Insufficient documentation

## 2014-10-21 DIAGNOSIS — Z87891 Personal history of nicotine dependence: Secondary | ICD-10-CM | POA: Diagnosis not present

## 2014-10-21 DIAGNOSIS — E785 Hyperlipidemia, unspecified: Secondary | ICD-10-CM | POA: Diagnosis not present

## 2014-10-21 DIAGNOSIS — K5909 Other constipation: Secondary | ICD-10-CM | POA: Diagnosis not present

## 2014-10-21 DIAGNOSIS — Z8719 Personal history of other diseases of the digestive system: Secondary | ICD-10-CM | POA: Diagnosis not present

## 2014-10-21 DIAGNOSIS — E538 Deficiency of other specified B group vitamins: Secondary | ICD-10-CM | POA: Insufficient documentation

## 2014-10-21 DIAGNOSIS — K5900 Constipation, unspecified: Secondary | ICD-10-CM | POA: Diagnosis present

## 2014-10-21 DIAGNOSIS — Z79899 Other long term (current) drug therapy: Secondary | ICD-10-CM | POA: Insufficient documentation

## 2014-10-21 DIAGNOSIS — M1990 Unspecified osteoarthritis, unspecified site: Secondary | ICD-10-CM | POA: Insufficient documentation

## 2014-10-21 DIAGNOSIS — I10 Essential (primary) hypertension: Secondary | ICD-10-CM | POA: Insufficient documentation

## 2014-10-21 DIAGNOSIS — D649 Anemia, unspecified: Secondary | ICD-10-CM | POA: Insufficient documentation

## 2014-10-21 DIAGNOSIS — F419 Anxiety disorder, unspecified: Secondary | ICD-10-CM | POA: Diagnosis not present

## 2014-10-21 NOTE — Telephone Encounter (Signed)
Pt called in and states she is constipated and she is wondering if there is a prescription that can be called in to help her. Pharmacy is [facility] on [address]

## 2014-10-21 NOTE — ED Notes (Signed)
Pt was sent here from [place], she hasn't had a BM since Saturday, no OTC meds are working

## 2014-10-21 NOTE — ED Notes (Addendum)
Pt helped back in bed.

## 2014-10-21 NOTE — ED Notes (Signed)
Pt continues to try and use bathroom. Told to pull call bell when finished for assistance. Family informed that pt is safe and will be coming back to room when she is finished.

## 2014-10-22 ENCOUNTER — Encounter (HOSPITAL_COMMUNITY): Payer: Self-pay | Admitting: Emergency Medicine

## 2014-10-22 ENCOUNTER — Other Ambulatory Visit: Payer: Self-pay

## 2014-10-22 ENCOUNTER — Emergency Department (HOSPITAL_COMMUNITY): Payer: Medicare Other

## 2014-10-22 MED ORDER — SENNOSIDES-DOCUSATE SODIUM 8.6-50 MG PO TABS: 1.0000 | ORAL_TABLET | Freq: Two times a day (BID) | ORAL | Status: DC

## 2014-10-22 MED ORDER — POLYETHYLENE GLYCOL 3350 17 G PO PACK
17.0000 g | PACK | Freq: Every day | ORAL | Status: DC
  Administered 2014-10-22: 17 g via ORAL
  Filled 2014-10-22 (×2): qty 1

## 2014-10-22 MED ORDER — GLYCERIN (LAXATIVE) 2.1 G RE SUPP
1.0000 | Freq: Once | RECTAL | Status: AC
  Administered 2014-10-22: 1 via RECTAL
  Filled 2014-10-22: qty 1

## 2014-10-22 MED ORDER — PREGABALIN 50 MG PO CAPS
50.0000 mg | ORAL_CAPSULE | Freq: Three times a day (TID) | ORAL | Status: DC
Start: 2014-10-22 — End: 2015-04-18

## 2014-10-22 MED ORDER — POLYETHYLENE GLYCOL 3350 17 G PO PACK: 17.0000 g | PACK | Freq: Two times a day (BID) | ORAL | Status: DC

## 2014-10-22 MED ORDER — DOCUSATE SODIUM 100 MG PO CAPS: 100.0000 mg | ORAL_CAPSULE | Freq: Two times a day (BID) | ORAL | Status: DC

## 2014-10-22 MED ORDER — MINERAL OIL RE ENEM
1.0000 | ENEMA | Freq: Once | RECTAL | Status: AC
Start: 2014-10-22 — End: 2014-10-22
  Administered 2014-10-22: 1 via RECTAL
  Filled 2014-10-22: qty 1

## 2014-10-22 NOTE — Discharge Instructions (Signed)

## 2014-10-22 NOTE — Telephone Encounter (Signed)
Contacted pt and asked if she was taking any OTC meds for constipation.   Pt stated that she will try miralax and let us know if it does not work.

## 2014-10-22 NOTE — ED Provider Notes (Signed)
CSN: [id number]     Arrival date & time 10/21/14  2022 History  This chart was scribed for Albino Bufford, MD by Meriel Pica, ED Scribe. This patient was seen in room WA11/WA11 and the patient's care was started 12:51 AM.   Chief Complaint  Patient presents with  . Constipation   Patient is a 81 y.o. female presenting with constipation. The history is provided by the patient and a relative. No language interpreter was used.  Constipation Severity:  Moderate Time since last bowel movement:  3 days Timing:  Constant Progression:  Unchanged Chronicity:  Recurrent Context: medication and narcotics   Context comment:  Lyrica, percocet  Stool description:  Watery Relieved by:  Nothing Worsened by:  Narcotic pain medications and prescription drugs Ineffective treatments:  Laxatives Associated symptoms: no abdominal pain, no anorexia, no back pain, no dysuria, no fever, no flatus, no nausea, no urinary retention and no vomiting   Risk factors: no change in medication   Risk factors comment:  On chronic narcotics   HPI Comments: MASEY SCHEIBER is a 81 y.o. female who presents to the Emergency Department from [place] complaining of gradually worsening, constant, moderate constipation onset 3 days ago. She states she normally has a BM daily. Pt took 2 laxatives 1 day ago with mild liquid/watery stool production. Per family member, pt experiences constipation every 6 months. Pt is prescribed Lyrica and percocet which she takes daily. She is not followed by a GI doctor. Denies recent medication changes or fever.    Past Medical History  Diagnosis Date  . Hypertension   . Venous insufficiency   . Hypercholesteremia   . Hiatal hernia   . Dysphagia   . Diverticulosis of colon   . Irritable bowel syndrome   . Colitis     lymphocytic colitis feb 2011  . Urinary incontinence   . DJD (degenerative joint disease)   . Lumbar back pain   . Osteoporosis   . Anxiety   . Anemia   . Iron  deficiency   . Vitamin B12 deficiency   . Allergic rhinitis    Past Surgical History  Procedure Laterality Date  . Cataract surgery    . Abdominal hysterectomy    . Decompressive lumbar laminectomy  06/2008    at L2-3, L3-4 and L4-5 by Dr. Ronnald Ramp  . Laparoscopic cholecystectomy  04/2009    Dr. Abran Cantor   History reviewed. No pertinent family history. Social History  Substance Use Topics  . Smoking status: Former Smoker -- 0.50 packs/day for 10 years    Types: Cigarettes  . Smokeless tobacco: Never Used  . Alcohol Use: No   OB History    No data available     Review of Systems  Constitutional: Negative for fever.  Gastrointestinal: Positive for constipation. Negative for nausea, vomiting, abdominal pain, anorexia and flatus.  Genitourinary: Negative for dysuria.  Musculoskeletal: Negative for back pain.  All other systems reviewed and are negative.  Allergies  Amoxicillin-pot clavulanate and Sulfonamide derivatives  Home Medications   Prior to Admission medications   Medication Sig Start Date End Date Taking? Authorizing Provider  aspirin 81 MG tablet Take 81 mg by mouth daily.     Yes Historical Provider, MD  calcium-vitamin D (OSCAL WITH D) 500-200 MG-UNIT per tablet Take 1 tablet by mouth daily.    Yes Historical Provider, MD  cholecalciferol (VITAMIN D) 1000 UNITS tablet Take 1,000 Units by mouth daily.   Yes Historical Provider, MD  diclofenac (  FLECTOR) 1.3 % PTCH Place 1 patch onto the skin every 12 (twelve) hours. Apply as directed by Dr. Brien Few   Yes Historical Provider, MD  esomeprazole (NEXIUM) 40 MG capsule Take 1 capsule (40 mg total) by mouth daily. 07/23/14  Yes Rowe Clack, MD  Ferrous Sulfate Dried (FEOSOL) 200 (65 FE) MG TABS Take 1 tablet by mouth 2 (two) times daily. Take with a vitamin c 500mg     Yes Historical Provider, MD  fexofenadine (ALLEGRA) 180 MG tablet Take 180 mg by mouth daily as needed for allergies or rhinitis.   Yes Historical  Provider, MD  oxyCODONE-acetaminophen (PERCOCET) 5-325 MG per tablet Take 1 tablet by mouth every 6 (six) hours as needed for moderate pain.    Yes Historical Provider, MD  pregabalin (LYRICA) 50 MG capsule Take 1 capsule (50 mg total) by mouth 3 (three) times daily. 05/06/14  Yes Rowe Clack, MD  PROLIA 60 MG/ML SOLN injection Inject 60 mg into the skin every 6 (six) months.  03/19/14  Yes Historical Provider, MD  simvastatin (ZOCOR) 40 MG tablet TAKE 1 TABLET AT BEDTIME 07/04/14  Yes Rowe Clack, MD  solifenacin (VESICARE) 10 MG tablet Take 1 tablet (10 mg total) by mouth daily. 06/17/14  Yes Rowe Clack, MD  vitamin B-12 (CYANOCOBALAMIN) 1000 MCG tablet Take 1 tablet (1,000 mcg total) by mouth daily. 06/17/14  Yes Rowe Clack, MD  Dextromethorphan-Guaifenesin 60-1200 MG per 12 hr tablet Take 1 tablet by mouth every 12 (twelve) hours. Patient not taking: Reported on 10/21/2014 08/29/13   Rowe Clack, MD  hydrochlorothiazide (MICROZIDE) 12.5 MG capsule Take 1 capsule (12.5 mg total) by mouth daily. Patient not taking: Reported on 10/21/2014 06/17/14   Rowe Clack, MD  senna-docusate (SENOKOT-S) 8.6-50 MG per tablet Take 2 tablets by mouth daily. Patient not taking: Reported on 10/21/2014 07/14/13   Carmin Muskrat, MD   BP 130/84 mmHg  Pulse 122  Temp(Src) 97.9 F (36.6 C) (Oral)  Resp 16  SpO2 95% Physical Exam  Constitutional: She is oriented to person, place, and time. She appears well-developed and well-nourished. No distress.  HENT:  Head: Normocephalic.  Mouth/Throat: Oropharynx is clear and moist. No oropharyngeal exudate.  Eyes: Conjunctivae and EOM are normal. Pupils are equal, round, and reactive to light. Right eye exhibits no discharge. Left eye exhibits no discharge. No scleral icterus.  Neck: Normal range of motion. Neck supple. No JVD present.  Cardiovascular: Normal rate, regular rhythm and normal heart sounds.   Pulmonary/Chest: Effort normal  and breath sounds normal. No respiratory distress. She has no wheezes. She has no rales.  Abdominal: Soft. She exhibits no mass. There is no tenderness. There is no rigidity, no rebound, no guarding, no tenderness at McBurney's point and negative Murphy's sign.  Hyperactive bowel sounds throughout. Stool in the transverse colon  Musculoskeletal: Normal range of motion. She exhibits no edema or tenderness.  Neurological: She is alert and oriented to person, place, and time. She has normal reflexes. Coordination normal.  Skin: Skin is warm. No rash noted. No erythema. No pallor.  Psychiatric: She has a normal mood and affect. Her behavior is normal.  Nursing note and vitals reviewed.   ED Course  Procedures  DIAGNOSTIC STUDIES: Oxygen Saturation is 95% on RA, adequate by my interpretation.    COORDINATION OF CARE: 12:55 AM Discussed treatment plan which includes to order diagnostic imaging with pt. Pt acknowledges and agrees to plan.   Labs Review Labs  Reviewed - No data to display  Imaging Review Dg Abd Acute W/chest  10/22/2014   CLINICAL DATA:  Constipation and rectal pain for 24 hr.  EXAM: DG ABDOMEN ACUTE W/ 1V CHEST  COMPARISON:  Chest 09/1911.  Abdomen 08/30/2008  FINDINGS: Shallow inspiration. Normal heart size and pulmonary vascularity. Large esophageal hiatal hernia behind the heart. Infiltration in both lung bases. This could represent pneumonia or atelectasis.  Gas and stool throughout the colon. Gas-filled rectum. No small or large bowel distention. No free intra-abdominal air. No abnormal air-fluid levels. Surgical clips in the right upper quadrant. No radiopaque stones. Calcified phleboliths in the pelvis. Degenerative changes in the spine.  IMPRESSION: Infiltration or atelectasis in both lung bases, greater on the left. Stool-filled colon. No evidence of obstruction.   Electronically Signed   By: Lucienne Capers M.D.   On: 10/22/2014 02:27   I have personally reviewed and  evaluated these images and lab results as part of my medical decision-making.   EKG Interpretation None      MDM   Final diagnoses:  None    Has only tried one dose of laxatives, given enemas in the ED with relief of stool stool.  No signs of obstipation.  Will start bowel regimen and close follow up with your PMD to discuss opioid induced constipation  I personally performed the services described in this documentation, which was scribed in my presence. The recorded information has been reviewed and is accurate.      Veatrice Kells, MD 10/22/14 [phone]

## 2014-10-22 NOTE — ED Notes (Signed)
Mineral oil enema did not produce results.  Gylcerin suppository placed.  Stool could be felt close to the rectum

## 2014-10-25 ENCOUNTER — Telehealth: Payer: Self-pay | Admitting: Internal Medicine

## 2014-10-25 NOTE — Telephone Encounter (Signed)
senna-docusate (SENOKOT S) 8.6-50 MG per tablet [[id number]]   polyethylene glycol (MIRALAX / GLYCOLAX) packet [[id number]]      docusate sodium (COLACE) 100 MG capsule [[id number]]   Stanton Kidney is calling in to advise that the above was RX from the hospital. She is wondering if they are too much for her when combined with her current meds. She wants to make sure that they are all ok to take together. She is also wondering about some "new" meds that they recommended, but was unsure of the names. Please give her a call to discuss.

## 2014-10-25 NOTE — Telephone Encounter (Signed)
Pt informed of MD response.  

## 2014-10-25 NOTE — Telephone Encounter (Signed)
Would not recommend taking senokot-d and colace as they are the same medicine. This depends on her. If she starts taking the miralax and senokot-d and is having loose stools recommend to stop miralax and continue on senokot-d alone. If still having loose stools call back.

## 2014-11-26 LAB — HM DEXA SCAN

## 2014-11-26 LAB — HM MAMMOGRAPHY

## 2014-12-03 ENCOUNTER — Ambulatory Visit (INDEPENDENT_AMBULATORY_CARE_PROVIDER_SITE_OTHER): Payer: Medicare Other | Admitting: Internal Medicine

## 2014-12-03 ENCOUNTER — Encounter: Payer: Self-pay | Admitting: Internal Medicine

## 2014-12-03 VITALS — BP 114/72 | HR 122 | Temp 98.2°F | Ht 59.0 in | Wt 122.0 lb

## 2014-12-03 DIAGNOSIS — Z23 Encounter for immunization: Secondary | ICD-10-CM | POA: Diagnosis not present

## 2014-12-03 DIAGNOSIS — K5900 Constipation, unspecified: Secondary | ICD-10-CM

## 2014-12-03 DIAGNOSIS — K5909 Other constipation: Secondary | ICD-10-CM | POA: Insufficient documentation

## 2014-12-03 MED ORDER — LACTULOSE 10 GM/15ML PO SOLN: 30.0000 g | Freq: Two times a day (BID) | ORAL | Status: DC | PRN

## 2014-12-03 NOTE — Assessment & Plan Note (Signed)
[place] for d/c senakot, try lactulose asd,  to f/u any worsening symptoms or concerns, cont all else

## 2014-12-03 NOTE — Progress Notes (Signed)
Pre visit review using our clinic review tool, if applicable. No additional management support is needed unless otherwise documented below in the visit note. 

## 2014-12-03 NOTE — Progress Notes (Signed)
Subjective:    Patient ID: Cathy [name], female    DOB: [birth year], 81 y.o.   MRN: [id number]  HPI  Here with c/o recurring constipation, on mult meds that could be related, including zocor, vesicare and narcotic, but still worse in last few days with abd pain, and nasuea despite miralax, colace and senakot Past Medical History  Diagnosis Date  . Hypertension   . Venous insufficiency   . Hypercholesteremia   . Hiatal hernia   . Dysphagia   . Diverticulosis of colon   . Irritable bowel syndrome   . Colitis     lymphocytic colitis feb 2011  . Urinary incontinence   . DJD (degenerative joint disease)   . Lumbar back pain   . Osteoporosis   . Anxiety   . Anemia   . Iron deficiency   . Vitamin B12 deficiency   . Allergic rhinitis    Past Surgical History  Procedure Laterality Date  . Cataract surgery    . Abdominal hysterectomy    . Decompressive lumbar laminectomy  06/2008    at L2-3, L3-4 and L4-5 by Dr. Ronnald Ramp  . Laparoscopic cholecystectomy  04/2009    Dr. Abran Cantor    reports that she has quit smoking. Her smoking use included Cigarettes. She has a 5 pack-year smoking history. She has never used smokeless tobacco. She reports that she does not drink alcohol or use illicit drugs. family history is not on file. Allergies  Allergen Reactions  . Amoxicillin-Pot Clavulanate     REACTION: diarrhea  . Sulfonamide Derivatives     REACTION: unsure of reaction   Current Outpatient Prescriptions on File Prior to Visit  Medication Sig Dispense Refill  . aspirin 81 MG tablet Take 81 mg by mouth daily.      . calcium-vitamin D (OSCAL WITH D) 500-200 MG-UNIT per tablet Take 1 tablet by mouth daily.     . cholecalciferol (VITAMIN D) 1000 UNITS tablet Take 1,000 Units by mouth daily.    . diclofenac (FLECTOR) 1.3 % PTCH Place 1 patch onto the skin every 12 (twelve) hours. Apply as directed by Dr. Brien Few    . docusate sodium (COLACE) 100 MG capsule Take 1 capsule (100 mg total) by  mouth every 12 (twelve) hours. 60 capsule 0  . esomeprazole (NEXIUM) 40 MG capsule Take 1 capsule (40 mg total) by mouth daily. 90 capsule 3  . Ferrous Sulfate Dried (FEOSOL) 200 (65 FE) MG TABS Take 1 tablet by mouth 2 (two) times daily. Take with a vitamin c 500mg      . fexofenadine (ALLEGRA) 180 MG tablet Take 180 mg by mouth daily as needed for allergies or rhinitis.    [name] oxyCODONE-acetaminophen (PERCOCET) 5-325 MG per tablet Take 1 tablet by mouth every 6 (six) hours as needed for moderate pain.     . polyethylene glycol (MIRALAX / GLYCOLAX) packet Take 17 g by mouth 2 (two) times daily. 28 each 0  . pregabalin (LYRICA) 50 MG capsule Take 1 capsule (50 mg total) by mouth 3 (three) times daily. 270 capsule 1  . PROLIA 60 MG/ML SOLN injection Inject 60 mg into the skin every 6 (six) months.     . senna-docusate (SENOKOT S) 8.6-50 MG per tablet Take 1 tablet by mouth 2 (two) times daily. 60 tablet 0  . simvastatin (ZOCOR) 40 MG tablet TAKE 1 TABLET AT BEDTIME 90 tablet 3  . solifenacin (VESICARE) 10 MG tablet Take 1 tablet (10 mg total) by mouth  daily. 90 tablet 1  . vitamin B-12 (CYANOCOBALAMIN) 1000 MCG tablet Take 1 tablet (1,000 mcg total) by mouth daily.    [name] Dextromethorphan-Guaifenesin 60-1200 MG per 12 hr tablet Take 1 tablet by mouth every 12 (twelve) hours. (Patient not taking: Reported on 10/21/2014) 20 tablet 0  . hydrochlorothiazide (MICROZIDE) 12.5 MG capsule Take 1 capsule (12.5 mg total) by mouth daily. (Patient not taking: Reported on 10/21/2014) 30 capsule 5   No current facility-administered medications on file prior to visit.  \ Review of Systems All otherwise neg per pt     Objective:   Physical Exam BP 114/72 mmHg  Pulse 122  Temp(Src) 98.2 F (36.8 C) (Oral)  Ht 4\' 11"  (1.499 m)  Wt 122 lb (55.339 kg)  BMI 24.63 kg/m2  SpO2 96% VS noted,  Constitutional: Pt appears in no significant distress HENT: Head: NCAT.  Right Ear: External ear normal.  Left Ear:  External ear normal.  Eyes: . Pupils are equal, round, and reactive to light. Conjunctivae and EOM are normal Neck: Normal range of motion. Neck supple.  Cardiovascular: Normal rate and regular rhythm.   Pulmonary/Chest: Effort normal and breath sounds without rales or wheezing.  Abd:  Soft, NT, ND, + BS Neurological: Pt is alert. Not confused , motor grossly intact Skin: Skin is warm. No rash, no LE edema Psychiatric: Pt behavior is normal. No agitation.      Assessment & Plan:

## 2014-12-03 NOTE — Patient Instructions (Signed)
Please take all new medication as prescribed - the lactulose  Please continue all other medications as before, and refills have been done if requested.  Please have the pharmacy call with any other refills you may need.  Please keep your appointments with your specialists as you may have planned  Please return in 3 months, or sooner if needed

## 2014-12-04 ENCOUNTER — Encounter: Payer: Self-pay | Admitting: Internal Medicine

## 2014-12-18 ENCOUNTER — Ambulatory Visit: Payer: Medicare Other | Admitting: Internal Medicine

## 2015-01-16 ENCOUNTER — Ambulatory Visit (INDEPENDENT_AMBULATORY_CARE_PROVIDER_SITE_OTHER): Payer: Medicare Other | Admitting: Urgent Care

## 2015-01-16 VITALS — BP 110/66 | HR 123 | Temp 101.1°F | Resp 18 | Ht <= 58 in | Wt 122.8 lb

## 2015-01-16 DIAGNOSIS — R109 Unspecified abdominal pain: Secondary | ICD-10-CM | POA: Diagnosis not present

## 2015-01-16 DIAGNOSIS — N3090 Cystitis, unspecified without hematuria: Secondary | ICD-10-CM | POA: Diagnosis not present

## 2015-01-16 DIAGNOSIS — R509 Fever, unspecified: Secondary | ICD-10-CM | POA: Diagnosis not present

## 2015-01-16 LAB — POC MICROSCOPIC URINALYSIS (UMFC): MUCUS RE: ABSENT

## 2015-01-16 LAB — POCT CBC
GRANULOCYTE PERCENT: 78.5 % (ref 37–80)
HEMATOCRIT: 36 % — AB (ref 37.7–47.9)
Hemoglobin: 12.2 g/dL (ref 12.2–16.2)
Lymph, poc: 1.3 (ref 0.6–3.4)
MCH, POC: 31 pg (ref 27–31.2)
MCHC: 33.9 g/dL (ref 31.8–35.4)
MCV: 91.4 fL (ref 80–97)
MID (cbc): 2.4 — AB (ref 0–0.9)
MPV: 7.6 fL (ref 0–99.8)
POC GRANULOCYTE: 13.5 — AB (ref 2–6.9)
POC LYMPH %: 7.4 % — AB (ref 10–50)
POC MID %: 14.1 %M — AB (ref 0–12)
Platelet Count, POC: 157 10*3/uL (ref 142–424)
RBC: 3.94 M/uL — AB (ref 4.04–5.48)
RDW, POC: 13.9 %
WBC: 17.2 10*3/uL — AB (ref 4.6–10.2)

## 2015-01-16 LAB — POCT URINALYSIS DIP (MANUAL ENTRY)
Bilirubin, UA: NEGATIVE
GLUCOSE UA: NEGATIVE
NITRITE UA: POSITIVE — AB
Spec Grav, UA: 1.02
UROBILINOGEN UA: 0.2
pH, UA: 7

## 2015-01-16 MED ORDER — CIPROFLOXACIN HCL 500 MG PO TABS: 500.0000 mg | ORAL_TABLET | Freq: Two times a day (BID) | ORAL | Status: DC

## 2015-01-16 MED ORDER — CEFTRIAXONE SODIUM 500 MG IJ SOLR
500.0000 mg | Freq: Once | INTRAMUSCULAR | Status: AC
  Administered 2015-01-16: 500 mg via INTRAMUSCULAR

## 2015-01-16 NOTE — Progress Notes (Signed)
MRN: [id number] DOB: 1927/06/24  Subjective:   Cathy [name] is a 81 y.o. female presenting for chief complaint of Fever and Generalized Body Aches  Reports onset of acute malaise at ~15:00, fever (~103F), right side flank pain, chills, achiness. Symptoms had sudden onset. Has tried Tylenol substitute with some relief, last dose at ~17:00. Of note, patient received her flu shot 12/03/2014. Admits history of UTIs. Has diarrhea with Augmentin. Has a history of C. diff. Denies chest pain, cough, shob, n/v, abdominal pain, headache, throat pain, heart racing, palpitations. Denies sick contacts. Denies any other aggravating or relieving factors, no other questions or concerns.  Cathy [name] has a current medication list which includes the following prescription(s): aspirin, calcium-vitamin d, cholecalciferol, diclofenac, docusate sodium, esomeprazole, ferrous sulfate dried, fexofenadine, oxycodone-acetaminophen, polyethylene glycol, pregabalin, prolia, simvastatin, solifenacin, and vitamin b-12. Also is allergic to amoxicillin-pot clavulanate and sulfonamide derivatives.  Cathy [name]  has a past medical history of Hypertension; Venous insufficiency; Hypercholesteremia; Hiatal hernia; Dysphagia; Diverticulosis of colon; Irritable bowel syndrome; Colitis; Urinary incontinence; DJD (degenerative joint disease); Lumbar back pain; Osteoporosis; Anxiety; Anemia; Iron deficiency; Vitamin B12 deficiency; and Allergic rhinitis. Also  has past surgical history that includes cataract surgery; Abdominal hysterectomy; decompressive lumbar laminectomy (06/2008); Laparoscopic cholecystectomy (04/2009); and Replacement total knee.  Objective:   Vitals: BP 110/66 mmHg  Pulse 123  Temp(Src) 101.1 F (38.4 C) (Oral)  Resp 18  Ht 4\' 9"  (1.448 m)  Wt 122 lb 12.8 oz (55.702 kg)  BMI 26.57 kg/m2  SpO2 93%  Physical Exam  Constitutional: She is oriented to person, place, and time. She appears well-developed and well-nourished.    HENT:  Mouth/Throat: Oropharynx is clear and moist.  Eyes: No scleral icterus.  Cardiovascular: Normal rate, regular rhythm and intact distal pulses.  Exam reveals no gallop and no friction rub.   No murmur heard. Pulmonary/Chest: No respiratory distress. She has no wheezes. She has no rales.  Abdominal: Soft. Bowel sounds are normal. She exhibits no distension and no mass. There is no tenderness.  Musculoskeletal: She exhibits edema (trace pitting edema).  Neurological: She is alert and oriented to person, place, and time.  Skin: Skin is warm and dry. No rash noted. No erythema. No pallor.  Psychiatric: She has a normal mood and affect.   Results for orders placed or performed in visit on 01/16/15 (from the past 24 hour(s))  POCT urinalysis dipstick     Status: Abnormal   Collection Time: 01/16/15  8:36 PM  Result Value Ref Range   Color, UA yellow yellow   Clarity, UA hazy (A) clear   Glucose, UA negative negative   Bilirubin, UA negative negative   Ketones, POC UA small (15) (A) negative   Spec Grav, UA 1.020    Blood, UA moderate (A) negative   pH, UA 7.0    Protein Ur, POC >=300 (A) negative   Urobilinogen, UA 0.2    Nitrite, UA Positive (A) Negative   Leukocytes, UA moderate (2+) (A) Negative  POCT CBC     Status: Abnormal   Collection Time: 01/16/15  8:37 PM  Result Value Ref Range   WBC 17.2 (A) 4.6 - 10.2 K/uL   Lymph, poc 1.3 0.6 - 3.4   POC LYMPH PERCENT 7.4 (A) 10 - 50 %L   MID (cbc) 2.4 (A) 0 - 0.9   POC MID % 14.1 (A) 0 - 12 %M   POC Granulocyte 13.5 (A) 2 - 6.9   Granulocyte percent 78.5 37 -  80 %G   RBC 3.94 (A) 4.04 - 5.48 M/uL   Hemoglobin 12.2 12.2 - 16.2 g/dL   HCT, POC 36.0 (A) 37.7 - 47.9 %   MCV 91.4 80 - 97 fL   MCH, POC 31.0 27 - 31.2 pg   MCHC 33.9 31.8 - 35.4 g/dL   RDW, POC 13.9 %   Platelet Count, POC 157 142 - 424 K/uL   MPV 7.6 0 - 99.8 fL  POCT Microscopic Urinalysis ([facility])     Status: Abnormal   Collection Time: 01/16/15  8:43 PM   Result Value Ref Range   WBC,UR,HPF,POC Too numerous to count  (A) None WBC/hpf   RBC,UR,HPF,POC Many (A) None RBC/hpf   Bacteria Moderate (A) None, Too numerous to count   Mucus Absent Absent   Epithelial Cells, UR Per Microscopy Few (A) None, Too numerous to count cells/hpf   Assessment and Plan :   1. Cystitis 2. Right flank pain 3. Fever, unspecified - Will cover for pyelonephritis with 500mg  Ceftriaxone today, start cipro twice a day with food and probiotic. Urine culture pending. RTC in 1 week or less if no improvement.   Jaynee Eagles, PA-C [facility] [phone] 01/16/2015 8:08 PM

## 2015-01-16 NOTE — Patient Instructions (Signed)

## 2015-01-17 ENCOUNTER — Encounter: Payer: Self-pay | Admitting: Internal Medicine

## 2015-01-17 LAB — COMPREHENSIVE METABOLIC PANEL
ALK PHOS: 114 U/L (ref 33–130)
ALT: 239 U/L — AB (ref 6–29)
AST: 161 U/L — ABNORMAL HIGH (ref 10–35)
Albumin: 3.7 g/dL (ref 3.6–5.1)
BUN: 13 mg/dL (ref 7–25)
CALCIUM: 8.7 mg/dL (ref 8.6–10.4)
CHLORIDE: 101 mmol/L (ref 98–110)
CO2: 24 mmol/L (ref 20–31)
Creat: 0.75 mg/dL (ref 0.60–0.88)
GLUCOSE: 95 mg/dL (ref 65–99)
POTASSIUM: 3.7 mmol/L (ref 3.5–5.3)
Sodium: 137 mmol/L (ref 135–146)
Total Bilirubin: 0.6 mg/dL (ref 0.2–1.2)
Total Protein: 5.9 g/dL — ABNORMAL LOW (ref 6.1–8.1)

## 2015-01-19 LAB — URINE CULTURE

## 2015-01-20 ENCOUNTER — Encounter: Payer: Self-pay | Admitting: Urgent Care

## 2015-01-31 ENCOUNTER — Emergency Department (HOSPITAL_COMMUNITY): Payer: Medicare Other

## 2015-01-31 ENCOUNTER — Ambulatory Visit (INDEPENDENT_AMBULATORY_CARE_PROVIDER_SITE_OTHER): Payer: Medicare Other | Admitting: Family Medicine

## 2015-01-31 ENCOUNTER — Encounter (HOSPITAL_COMMUNITY): Payer: Self-pay | Admitting: Emergency Medicine

## 2015-01-31 ENCOUNTER — Inpatient Hospital Stay (HOSPITAL_COMMUNITY)
Admission: EM | Admit: 2015-01-31 | Discharge: 2015-02-06 | DRG: 308 | Disposition: A | Discharge status: Home Health | Payer: Medicare Other | Attending: Internal Medicine | Admitting: Internal Medicine

## 2015-01-31 VITALS — BP 131/87 | HR 125 | Temp 98.3°F | Resp 16 | Ht <= 58 in | Wt 123.0 lb

## 2015-01-31 DIAGNOSIS — I4892 Unspecified atrial flutter: Principal | ICD-10-CM | POA: Diagnosis present

## 2015-01-31 DIAGNOSIS — I82409 Acute embolism and thrombosis of unspecified deep veins of unspecified lower extremity: Secondary | ICD-10-CM

## 2015-01-31 DIAGNOSIS — E876 Hypokalemia: Secondary | ICD-10-CM | POA: Diagnosis not present

## 2015-01-31 DIAGNOSIS — I5021 Acute systolic (congestive) heart failure: Secondary | ICD-10-CM | POA: Diagnosis present

## 2015-01-31 DIAGNOSIS — K7689 Other specified diseases of liver: Secondary | ICD-10-CM

## 2015-01-31 DIAGNOSIS — Z96659 Presence of unspecified artificial knee joint: Secondary | ICD-10-CM | POA: Diagnosis present

## 2015-01-31 DIAGNOSIS — M810 Age-related osteoporosis without current pathological fracture: Secondary | ICD-10-CM | POA: Diagnosis present

## 2015-01-31 DIAGNOSIS — I471 Supraventricular tachycardia: Secondary | ICD-10-CM | POA: Diagnosis present

## 2015-01-31 DIAGNOSIS — Z7982 Long term (current) use of aspirin: Secondary | ICD-10-CM

## 2015-01-31 DIAGNOSIS — I4581 Long QT syndrome: Secondary | ICD-10-CM | POA: Diagnosis not present

## 2015-01-31 DIAGNOSIS — R935 Abnormal findings on diagnostic imaging of other abdominal regions, including retroperitoneum: Secondary | ICD-10-CM

## 2015-01-31 DIAGNOSIS — K5900 Constipation, unspecified: Secondary | ICD-10-CM | POA: Diagnosis not present

## 2015-01-31 DIAGNOSIS — Z66 Do not resuscitate: Secondary | ICD-10-CM | POA: Diagnosis present

## 2015-01-31 DIAGNOSIS — I2510 Atherosclerotic heart disease of native coronary artery without angina pectoris: Secondary | ICD-10-CM | POA: Diagnosis present

## 2015-01-31 DIAGNOSIS — R000 Tachycardia, unspecified: Secondary | ICD-10-CM

## 2015-01-31 DIAGNOSIS — I4891 Unspecified atrial fibrillation: Secondary | ICD-10-CM

## 2015-01-31 DIAGNOSIS — R9431 Abnormal electrocardiogram [ECG] [EKG]: Secondary | ICD-10-CM | POA: Diagnosis present

## 2015-01-31 DIAGNOSIS — R531 Weakness: Secondary | ICD-10-CM | POA: Diagnosis not present

## 2015-01-31 DIAGNOSIS — I10 Essential (primary) hypertension: Secondary | ICD-10-CM | POA: Diagnosis present

## 2015-01-31 DIAGNOSIS — M1990 Unspecified osteoarthritis, unspecified site: Secondary | ICD-10-CM | POA: Diagnosis present

## 2015-01-31 DIAGNOSIS — R5383 Other fatigue: Secondary | ICD-10-CM

## 2015-01-31 DIAGNOSIS — K589 Irritable bowel syndrome without diarrhea: Secondary | ICD-10-CM | POA: Diagnosis present

## 2015-01-31 DIAGNOSIS — Z87891 Personal history of nicotine dependence: Secondary | ICD-10-CM

## 2015-01-31 DIAGNOSIS — C801 Malignant (primary) neoplasm, unspecified: Secondary | ICD-10-CM | POA: Diagnosis present

## 2015-01-31 DIAGNOSIS — K5909 Other constipation: Secondary | ICD-10-CM | POA: Diagnosis present

## 2015-01-31 DIAGNOSIS — Z792 Long term (current) use of antibiotics: Secondary | ICD-10-CM

## 2015-01-31 DIAGNOSIS — Z09 Encounter for follow-up examination after completed treatment for conditions other than malignant neoplasm: Secondary | ICD-10-CM | POA: Diagnosis not present

## 2015-01-31 DIAGNOSIS — E7800 Pure hypercholesterolemia, unspecified: Secondary | ICD-10-CM | POA: Diagnosis present

## 2015-01-31 DIAGNOSIS — C7889 Secondary malignant neoplasm of other digestive organs: Secondary | ICD-10-CM | POA: Diagnosis present

## 2015-01-31 DIAGNOSIS — K769 Liver disease, unspecified: Secondary | ICD-10-CM | POA: Insufficient documentation

## 2015-01-31 DIAGNOSIS — E538 Deficiency of other specified B group vitamins: Secondary | ICD-10-CM | POA: Diagnosis present

## 2015-01-31 DIAGNOSIS — C787 Secondary malignant neoplasm of liver and intrahepatic bile duct: Secondary | ICD-10-CM | POA: Diagnosis present

## 2015-01-31 DIAGNOSIS — I2699 Other pulmonary embolism without acute cor pulmonale: Secondary | ICD-10-CM

## 2015-01-31 LAB — POCT URINALYSIS DIP (MANUAL ENTRY)
BILIRUBIN UA: NEGATIVE
Bilirubin, UA: NEGATIVE
Blood, UA: NEGATIVE
GLUCOSE UA: NEGATIVE
LEUKOCYTES UA: NEGATIVE
Nitrite, UA: NEGATIVE
PROTEIN UA: NEGATIVE
SPEC GRAV UA: 1.015
Urobilinogen, UA: 0.2
pH, UA: 5.5

## 2015-01-31 LAB — URINALYSIS, ROUTINE W REFLEX MICROSCOPIC
BILIRUBIN URINE: NEGATIVE
GLUCOSE, UA: NEGATIVE mg/dL
HGB URINE DIPSTICK: NEGATIVE
Ketones, ur: NEGATIVE mg/dL
Leukocytes, UA: NEGATIVE
Nitrite: NEGATIVE
Protein, ur: NEGATIVE mg/dL
SPECIFIC GRAVITY, URINE: 1.005 (ref 1.005–1.030)
pH: 7.5 (ref 5.0–8.0)

## 2015-01-31 LAB — CBC WITH DIFFERENTIAL/PLATELET
Basophils Absolute: 0 10*3/uL (ref 0.0–0.1)
Basophils Relative: 0 %
EOS PCT: 1 %
Eosinophils Absolute: 0.1 10*3/uL (ref 0.0–0.7)
HCT: 39.5 % (ref 36.0–46.0)
Hemoglobin: 12.7 g/dL (ref 12.0–15.0)
LYMPHS ABS: 0.9 10*3/uL (ref 0.7–4.0)
LYMPHS PCT: 9 %
MCH: 30.8 pg (ref 26.0–34.0)
MCHC: 32.2 g/dL (ref 30.0–36.0)
MCV: 95.6 fL (ref 78.0–100.0)
MONO ABS: 0.6 10*3/uL (ref 0.1–1.0)
MONOS PCT: 7 %
Neutro Abs: 8.2 10*3/uL — ABNORMAL HIGH (ref 1.7–7.7)
Neutrophils Relative %: 83 %
PLATELETS: 204 10*3/uL (ref 150–400)
RBC: 4.13 MIL/uL (ref 3.87–5.11)
RDW: 13.7 % (ref 11.5–15.5)
WBC: 9.8 10*3/uL (ref 4.0–10.5)

## 2015-01-31 LAB — CBC
HCT: 35.8 % — ABNORMAL LOW (ref 36.0–46.0)
HEMOGLOBIN: 11.3 g/dL — AB (ref 12.0–15.0)
MCH: 30.3 pg (ref 26.0–34.0)
MCHC: 31.6 g/dL (ref 30.0–36.0)
MCV: 96 fL (ref 78.0–100.0)
PLATELETS: 215 10*3/uL (ref 150–400)
RBC: 3.73 MIL/uL — ABNORMAL LOW (ref 3.87–5.11)
RDW: 13.7 % (ref 11.5–15.5)
WBC: 8.3 10*3/uL (ref 4.0–10.5)

## 2015-01-31 LAB — COMPREHENSIVE METABOLIC PANEL
ALT: 20 U/L (ref 14–54)
ANION GAP: 5 (ref 5–15)
AST: 24 U/L (ref 15–41)
Albumin: 3.5 g/dL (ref 3.5–5.0)
Alkaline Phosphatase: 58 U/L (ref 38–126)
BUN: 10 mg/dL (ref 6–20)
CHLORIDE: 107 mmol/L (ref 101–111)
CO2: 30 mmol/L (ref 22–32)
CREATININE: 0.73 mg/dL (ref 0.44–1.00)
Calcium: 9.6 mg/dL (ref 8.9–10.3)
GLUCOSE: 92 mg/dL (ref 65–99)
Potassium: 4.5 mmol/L (ref 3.5–5.1)
Sodium: 142 mmol/L (ref 135–145)
Total Bilirubin: 0.5 mg/dL (ref 0.3–1.2)
Total Protein: 6.4 g/dL — ABNORMAL LOW (ref 6.5–8.1)

## 2015-01-31 LAB — POCT CBC
GRANULOCYTE PERCENT: 84.6 % — AB (ref 37–80)
HCT, POC: 35 % — AB (ref 37.7–47.9)
HEMOGLOBIN: 12.4 g/dL (ref 12.2–16.2)
Lymph, poc: 1.1 (ref 0.6–3.4)
MCH: 31.6 pg — AB (ref 27–31.2)
MCHC: 35.4 g/dL (ref 31.8–35.4)
MCV: 89.4 fL (ref 80–97)
MID (CBC): 0.5 (ref 0–0.9)
MPV: 7.1 fL (ref 0–99.8)
PLATELET COUNT, POC: 210 10*3/uL (ref 142–424)
POC Granulocyte: 8.8 — AB (ref 2–6.9)
POC LYMPH PERCENT: 10.9 %L (ref 10–50)
POC MID %: 4.5 % (ref 0–12)
RBC: 3.92 M/uL — AB (ref 4.04–5.48)
RDW, POC: 14.1 %
WBC: 10.4 10*3/uL — AB (ref 4.6–10.2)

## 2015-01-31 LAB — CREATININE, SERUM
CREATININE: 0.7 mg/dL (ref 0.44–1.00)
GFR calc Af Amer: >60 mL/min (ref >60)

## 2015-01-31 LAB — POC MICROSCOPIC URINALYSIS (UMFC): Mucus: ABSENT

## 2015-01-31 LAB — LACTATE DEHYDROGENASE: LDH: 171 U/L (ref 98–192)

## 2015-01-31 LAB — TSH
TSH: 1.346 u[IU]/mL (ref 0.350–4.500)
TSH: 1.303 u[IU]/mL (ref 0.350–4.500)

## 2015-01-31 LAB — GLUCOSE, POCT (MANUAL RESULT ENTRY): POC Glucose: 99 mg/dl (ref 70–99)

## 2015-01-31 LAB — D-DIMER, QUANTITATIVE: D-Dimer, Quant: 1.09 ug/mL-FEU — ABNORMAL HIGH (ref 0.00–0.50)

## 2015-01-31 LAB — I-STAT TROPONIN, ED: Troponin i, poc: 0.02 ng/mL (ref 0.00–0.08)

## 2015-01-31 LAB — I-STAT CG4 LACTIC ACID, ED: Lactic Acid, Venous: 0.51 mmol/L (ref 0.5–2.0)

## 2015-01-31 LAB — MAGNESIUM: MAGNESIUM: 2 mg/dL (ref 1.7–2.4)

## 2015-01-31 MED ORDER — PREGABALIN 25 MG PO CAPS
50.0000 mg | ORAL_CAPSULE | Freq: Three times a day (TID) | ORAL | Status: DC
  Administered 2015-01-31 – 2015-02-06 (×16): 50 mg via ORAL
  Filled 2015-01-31 (×17): qty 2

## 2015-01-31 MED ORDER — SODIUM CHLORIDE 0.9 % IJ SOLN
3.0000 mL | Freq: Two times a day (BID) | INTRAMUSCULAR | Status: DC
  Administered 2015-02-02 – 2015-02-06 (×5): 3 mL via INTRAVENOUS

## 2015-01-31 MED ORDER — SODIUM CHLORIDE 0.9 % IV SOLN
INTRAVENOUS | Status: DC
  Administered 2015-01-31 – 2015-02-03 (×6): via INTRAVENOUS

## 2015-01-31 MED ORDER — PANTOPRAZOLE SODIUM 40 MG PO TBEC
40.0000 mg | DELAYED_RELEASE_TABLET | Freq: Every day | ORAL | Status: DC
  Administered 2015-02-01 – 2015-02-06 (×6): 40 mg via ORAL
  Filled 2015-01-31 (×6): qty 1

## 2015-01-31 MED ORDER — IOHEXOL 300 MG/ML  SOLN
100.0000 mL | Freq: Once | INTRAMUSCULAR | Status: AC | PRN
  Administered 2015-01-31: 100 mL via INTRAVENOUS

## 2015-01-31 MED ORDER — POLYETHYLENE GLYCOL 3350 17 G PO PACK
17.0000 g | PACK | Freq: Two times a day (BID) | ORAL | Status: DC
  Administered 2015-01-31: 17 g via ORAL
  Filled 2015-01-31 (×7): qty 1

## 2015-01-31 MED ORDER — SODIUM CHLORIDE 0.9 % IV BOLUS (SEPSIS)
1000.0000 mL | Freq: Once | INTRAVENOUS | Status: AC
  Administered 2015-01-31: 1000 mL via INTRAVENOUS

## 2015-01-31 MED ORDER — ONDANSETRON HCL 4 MG PO TABS
4.0000 mg | ORAL_TABLET | Freq: Four times a day (QID) | ORAL | Status: DC | PRN
Start: 2015-01-31 — End: 2015-02-06

## 2015-01-31 MED ORDER — ENOXAPARIN SODIUM 40 MG/0.4ML ~~LOC~~ SOLN
40.0000 mg | SUBCUTANEOUS | Status: DC
  Administered 2015-01-31: 40 mg via SUBCUTANEOUS
  Filled 2015-01-31: qty 0.4

## 2015-01-31 MED ORDER — OXYCODONE-ACETAMINOPHEN 5-325 MG PO TABS
1.0000 | ORAL_TABLET | Freq: Four times a day (QID) | ORAL | Status: DC | PRN
  Administered 2015-02-01 – 2015-02-04 (×4): 1 via ORAL
  Filled 2015-01-31 (×4): qty 1

## 2015-01-31 MED ORDER — ONDANSETRON HCL 4 MG/2ML IJ SOLN: 4.0000 mg | Freq: Four times a day (QID) | INTRAMUSCULAR | Status: DC | PRN

## 2015-01-31 MED ORDER — SIMVASTATIN 40 MG PO TABS
40.0000 mg | ORAL_TABLET | Freq: Every day | ORAL | Status: DC
  Administered 2015-02-01 – 2015-02-05 (×5): 40 mg via ORAL
  Filled 2015-01-31 (×5): qty 1

## 2015-01-31 MED ORDER — SODIUM CHLORIDE 0.9 % IV BOLUS (SEPSIS): 1000.0000 mL | Freq: Once | INTRAVENOUS | Status: DC

## 2015-01-31 MED ORDER — ASPIRIN EC 81 MG PO TBEC
81.0000 mg | DELAYED_RELEASE_TABLET | Freq: Every day | ORAL | Status: DC
  Administered 2015-02-01 – 2015-02-04 (×4): 81 mg via ORAL
  Filled 2015-01-31 (×7): qty 1

## 2015-01-31 NOTE — H&P (Signed)
[facility] History and Physical  Cathy [name] [id number] DOB: 27-Dec-1927 DOA: 01/31/2015  Referring physician: Emergency Department PCP: Gwendolyn Grant, MD   CHIEF COMPLAINT: fatigue    HPI: Cathy [name] is a 81 y.o. female who was recently treated for a UTI. Patient return to urgent care today for follow-up. Told urinalysis was normal but patient found to be tachycardic. She was advised to come to the ED. Patient denies chest pains or shortness of breath. No dizziness Patient is physically active and lives independently. Per daughter at bedside, patient cleans the house on a daily basis. Patient states her appetite at home has been fine, no unexpected weight loss. No fevers or chills. She has chronic constipation.   ED COURSE:     2 L of IV fluid  Labs:   Copies of metabolic profile normal Troponin 0.02 Lactic acid normal 0.5 Normal CBC  Urinalysis:   Normal  EKG:    Ectopic atrial tachycardia, unifocal Nonspecific IVCD with LAD LVH with secondary repolarization abnormality rate is faster compared to 2013 Confirmed by GOLDSTON MD, SCOTT ([id number]) on 01/31/2015 11:27:12 AM QT 521  Medications  sodium chloride 0.9 % bolus 1,000 mL (not administered)  sodium chloride 0.9 % bolus 1,000 mL (0 mLs Intravenous Stopped 01/31/15 1401)  iohexol (OMNIPAQUE) 300 MG/ML solution 100 mL (500 mLs Intravenous Contrast Given 01/31/15 1450)    Review of Systems  Constitutional: Positive for malaise/fatigue.  HENT: Negative.   Eyes: Negative.   Respiratory: Negative.   Cardiovascular: Negative.   Gastrointestinal: Positive for constipation.  Genitourinary: Negative.   Musculoskeletal: Negative.   Skin: Negative.   Neurological: Positive for weakness.  Endo/Heme/Allergies: Negative.   Psychiatric/Behavioral: Negative.      Past Medical History  Diagnosis Date  . Hypertension   . Venous insufficiency   . Hypercholesteremia   . Hiatal hernia   . Diverticulosis of colon    . Irritable bowel syndrome   . Colitis     lymphocytic colitis feb 2011  . Urinary incontinence   . DJD (degenerative joint disease)   . Lumbar back pain   . Osteoporosis   . Anxiety   . Anemia   . Iron deficiency   . Vitamin B12 deficiency   . Allergic rhinitis    Past Surgical History  Procedure Laterality Date  . Cataract surgery    . Abdominal hysterectomy    . Decompressive lumbar laminectomy  06/2008    at L2-3, L3-4 and L4-5 by Dr. Ronnald Ramp  . Laparoscopic cholecystectomy  04/2009    Dr. Abran Cantor  . Replacement total knee      SOCIAL HISTORY:  reports that she has quit smoking. Her smoking use included Cigarettes. She has a 5 pack-year smoking history. She has never used smokeless tobacco. She reports that she does not drink alcohol or use illicit drugs. Lives:   Alone at home.  Assistive devices:   Uses a cane for ambulation when outside of home   Allergies  Allergen Reactions  . Amoxicillin-Pot Clavulanate     REACTION: diarrhea  . Sulfonamide Derivatives     REACTION: unsure of reaction    FMH:  colon cancer in brother, liver cancer in another brother.   Prior to Admission medications   Medication Sig Start Date End Date Taking? Authorizing Provider  aspirin 81 MG tablet Take 81 mg by mouth daily.     Yes Historical Provider, MD  calcium-vitamin D (OSCAL WITH D) 500-200 MG-UNIT per tablet Take 1 tablet  by mouth daily.    Yes Historical Provider, MD  cholecalciferol (VITAMIN D) 1000 UNITS tablet Take 1,000 Units by mouth daily.   Yes Historical Provider, MD  ciprofloxacin (CIPRO) 500 MG tablet Take 500 mg by mouth 2 (two) times daily. Filled 01-17-15 for 10 days   Yes Historical Provider, MD  diclofenac (FLECTOR) 1.3 % PTCH Place 1 patch onto the skin every 12 (twelve) hours. Apply as directed by Dr. Brien Few   Yes Historical Provider, MD  esomeprazole (NEXIUM) 40 MG capsule Take 1 capsule (40 mg total) by mouth daily. 07/23/14  Yes Rowe Clack, MD  Ferrous  Sulfate Dried (FEOSOL) 200 (65 FE) MG TABS Take 1 tablet by mouth 2 (two) times daily. Take with a vitamin c 500mg     Yes Historical Provider, MD  fexofenadine (ALLEGRA) 180 MG tablet Take 180 mg by mouth daily as needed for allergies or rhinitis.   Yes Historical Provider, MD  oxyCODONE-acetaminophen (PERCOCET) 5-325 MG per tablet Take 1 tablet by mouth every 6 (six) hours as needed for moderate pain.    Yes Historical Provider, MD  polyethylene glycol (MIRALAX / GLYCOLAX) packet Take 17 g by mouth 2 (two) times daily. 10/22/14  Yes April Palumbo, MD  pregabalin (LYRICA) 50 MG capsule Take 1 capsule (50 mg total) by mouth 3 (three) times daily. 10/22/14  Yes Rowe Clack, MD  simvastatin (ZOCOR) 40 MG tablet TAKE 1 TABLET AT BEDTIME 07/04/14  Yes Rowe Clack, MD  solifenacin (VESICARE) 10 MG tablet Take 1 tablet (10 mg total) by mouth daily. 06/17/14  Yes Rowe Clack, MD  vitamin B-12 (CYANOCOBALAMIN) 1000 MCG tablet Take 1 tablet (1,000 mcg total) by mouth daily. 06/17/14  Yes Rowe Clack, MD  docusate sodium (COLACE) 100 MG capsule Take 1 capsule (100 mg total) by mouth every 12 (twelve) hours. 10/22/14   April Palumbo, MD  PROLIA 60 MG/ML SOLN injection Inject 60 mg into the skin every 6 (six) months.  03/19/14   Historical Provider, MD   PHYSICAL EXAM: Filed Vitals:   01/31/15 1117 01/31/15 1134 01/31/15 1200 01/31/15 1343  BP: 148/107  140/106 124/88  Pulse: 120  121 122  Temp: 97.8 F (36.6 C) 98.8 F (37.1 C)    TempSrc: Oral Rectal    Resp:   18 16  Height: 4\' 10"  (1.473 m)     Weight: 55.792 kg (123 lb)     SpO2: 99%  95% 96%    Wt Readings from Last 3 Encounters:  01/31/15 55.792 kg (123 lb)  01/31/15 55.792 kg (123 lb)  01/16/15 55.702 kg (122 lb 12.8 oz)    General:  Pleasant whtie female. Appears calm and comfortable Eyes: PER, normal lids, irises & conjunctiva ENT: grossly normal hearing, lips & tongue Neck: no LAD, no masses Cardiovascular: Sinus  tachycardia. No LE edema.  Respiratory: Respirations even and unlabored. Normal respiratory effort. Lungs CTA bilaterally, no wheezes / rales .   Abdomen: soft, non-distended, non-tender, active bowel sounds. No obvious masses.  Skin: no rash seen on limited exam Musculoskeletal: grossly normal tone BUE/BLE Psychiatric: grossly normal mood and affect, speech fluent and appropriate Neurologic: grossly non-focal.         LABS ON ADMISSION:    Basic Metabolic Panel:  Recent Labs Lab 01/31/15 1149  NA 142  K 4.5  CL 107  CO2 30  GLUCOSE 92  BUN 10  CREATININE 0.73  CALCIUM 9.6   Liver Function Tests:  Recent  Labs Lab 01/31/15 1149  AST 24  ALT 20  ALKPHOS 58  BILITOT 0.5  PROT 6.4*  ALBUMIN 3.5   CBC:  Recent Labs Lab 01/31/15 0947 01/31/15 1149  WBC 10.4* 9.8  NEUTROABS  --  8.2*  HGB 12.4 12.7  HCT 35.0* 39.5  MCV 89.4 95.6  PLT  --  204    CREATININE: 0.73 (01/31/15 1149) Estimated creatinine clearance - 38.1 mL/min  Radiological Exams on Admission: Ct Abdomen Pelvis W Contrast  01/31/2015  CLINICAL DATA:  Tachycardia, lower abdominal pain. Recently treated for urinary tract infection. History of colitis, diverticulosis and hiatal hernia. EXAM: CT ABDOMEN AND PELVIS WITH CONTRAST TECHNIQUE: Multidetector CT imaging of the abdomen and pelvis was performed using the standard protocol following bolus administration of intravenous contrast. CONTRAST:  524mL OMNIPAQUE IOHEXOL 300 MG/ML  SOLN COMPARISON:  Previous exams including CT abdomen and pelvis dated 04/19/2009 and 08/29/2008. FINDINGS: Lower chest: There is mild bibasilar scarring/atelectasis and small bilateral pleural effusions. Moderate to large hiatal hernia is unchanged. Hepatobiliary: There is slightly increased intrahepatic and extrahepatic bile duct dilatation, common bile duct now dilated to approximately 14 mm (previously measured at 12.5 mm). No obstructing bile duct stone or bile duct mass  identified. There is a new enhancing lesion within the inferior left hepatic lobe, likely segment 4B, measuring 2.7 x 1.5 cm, worrisome for metastasis. No other enhancing mass or lesion identified within the liver. Patient is status post cholecystectomy. Pancreas: No mass, inflammatory changes, or other significant abnormality. No significant pancreatic duct dilatation. Spleen: There are numerous hypodense masses within the spleen, increased significantly in number compared to previous studies, and largest lesions have increased in size. Largest lesion within the posterior aspect of the spleen measures 2.8 x 2.7 cm (2.1 cm greatest dimension on the previous exam, and approximately 1.5 cm on the earlier exam of 08/29/2008). Next largest lesion within the anterior spleen measures 2.7 x 2.3 cm (measured at 2.4 cm greatest dimension on the previous exam). The increase in size and number suggests neoplastic process. Adrenals/Urinary Tract: Left adrenal mass is slightly decreased in prominence measuring 3.2 x 2.1 cm on today's exam (previously measured at 3.4 x 2.5 cm). Right adrenal gland is normal. Both kidneys are unremarkable without stone or hydronephrosis. No ureteral or bladder calculi identified. Bladder is unremarkable. Stomach/Bowel: Bowel is normal in caliber. Scattered diverticulosis noted within the descending and sigmoid colon without evidence of acute diverticulitis. Bowel is difficult to definitively characterize without oral contrast but there is no obvious evidence of bowel wall thickening or bowel wall inflammation. Appendix is not seen but there are no inflammatory changes about the cecum to suggest acute appendicitis. Vascular/Lymphatic: Scattered atherosclerotic changes again noted along the walls of the ectatic but normal-caliber abdominal aorta. No acute vascular abnormality seen. No enlarged lymph nodes identified in the abdomen or pelvis. Reproductive: No mass or other significant abnormality.  Other: No free fluid or abscess collections seen. No free intraperitoneal air. Heart is at least mildly enlarged, incompletely imaged at the upper portion of this exam. Musculoskeletal: Degenerative changes are seen throughout the slightly scoliotic thoracolumbar spine but no acute osseous abnormality. No evidence of osseous metastasis seen. Evidence of a previous surgical decompression of the central canal noted in the lower lumbar spine. IMPRESSION: 1. Increased number and size of the numerous splenic masses indicating neoplastic process, possibly lymphoma or metastasis as suggested on the previous exam. 2. Stable, or slightly smaller, left adrenal mass. This also remains suspicious for  neoplastic process. 3. New enhancing lesion within the left hepatic lobe, segment 4B, measuring 2.8 x 1.5 cm. This is also suspicious for metastatic disease. 4. Mildly increased intrahepatic and extrahepatic bile duct dilatation, likely benign in nature and commensurate to the previous cholecystectomy. No bile duct stone seen. No obstructing mass seen. Could consider correlation with liver function tests. 5. Colonic diverticulosis without evidence of acute diverticulitis. 6. Fairly large amount of stool throughout the nondistended colon (constipation? ). No evidence of bowel obstruction or focal bowel wall inflammation. 7. Hiatal hernia, moderate to large in size, grossly stable compared to previous exams. 8. Other chronic/incidental findings detailed above. Electronically Signed   By: Franki Cabot M.D.   On: 01/31/2015 15:28    ASSESSMENT / PLAN   Sinus tachycardia. Reviewing outpatient data, heart rate is been in the 120 since October. Etiology unclear. She has not responded to 2 L of fluid in the ED. No associated chest pain or shortness of breath. Normal TSH -Admit to observation- telemetry bed -Doubt PE but not excluded, especially if there is underlying malignancy present. Will start with a d-dimer -If persistent,  may need Cardiology evaluation  Fatigue. Labs unremarkable. Etiology unclear but she does have an abnormal CTscan suggesting new liver lesion suspicious for metastatic disease as well as numerous splenic lesion which have increased in size and number since 2011.  -will speak with patient and family about how aggressive they would like to be regarding workup of these lesions. If patient would like to pursue workup, we will speak with interventional radiology about biopsy of liver lesion -Patient lives independently, physically active per daughter. She's been complaining of decreased energy lately. Will obtain a PT evaluation  Abnormal CT of the abdomen. See above.  Prolonged QT on ECG , 521.  -check mg+ level. -repeat EKG in am   Constipation, chronic. Patient was taking Senokot, and MiraLAX and Colace. Now down to just daily MiraLAX. Having daily bowel movement. CT scan shows large amount of stool throughout the colon - increase MiraLAX to twice daily  Recent UTI treated by urgent care. Normal urinalysis today.     CONSULTANTS:   none  Code Status: DNR DVT Prophylaxis: Lovenox  Family Communication:  Patient alert, oriented and understands plan of care.    Disposition Plan: Discharge to home in 24-48 hours   Time spent: 60 minutes Tye Savoy  NP [facility] Pager [phone]

## 2015-01-31 NOTE — ED Notes (Signed)
LUNCH TRAY ORDERED 

## 2015-01-31 NOTE — ED Notes (Signed)
To ED via [facility] from [facility], with c/o fast heart rate. Was being treated for UTI-- stopped antibiotics 4 days ago, was at [place] for follow up. Pt denies any c/o.

## 2015-01-31 NOTE — Progress Notes (Signed)
01/31/2015 9:53 AM   DOB: [birth year] / MRN: [id number]  SUBJECTIVE:  Cathy [name] is a frail 81 y.o. female presenting for a recheck of UTI diagnosed and treated on 01/16/2015.  She was treated with ceftriaxone 500 mg in the office and Cipro.  Today she denies dysuria, fever, chills and flank pain, but continues to complain of frequency and urgency which is not new, and per her report the "the UTI did not help."  She denies chest pain, palpitations, leg swelling, and SOB. She denies a history of heart disease.      She is allergic to amoxicillin-pot clavulanate and sulfonamide derivatives.   She  has a past medical history of Hypertension; Venous insufficiency; Hypercholesteremia; Hiatal hernia; Dysphagia; Diverticulosis of colon; Irritable bowel syndrome; Colitis; Urinary incontinence; DJD (degenerative joint disease); Lumbar back pain; Osteoporosis; Anxiety; Anemia; Iron deficiency; Vitamin B12 deficiency; and Allergic rhinitis.    She  reports that she has quit smoking. Her smoking use included Cigarettes. She has a 5 pack-year smoking history. She has never used smokeless tobacco. She reports that she does not drink alcohol or use illicit drugs. She  reports that she does not engage in sexual activity. The patient  has past surgical history that includes cataract surgery; Abdominal hysterectomy; decompressive lumbar laminectomy (06/2008); Laparoscopic cholecystectomy (04/2009); and Replacement total knee.  Her family history is not on file.  Review of Systems  Constitutional: Negative for fever and chills.  Eyes: Negative for blurred vision.  Respiratory: Negative for cough and shortness of breath.   Cardiovascular: Negative for chest pain.  Gastrointestinal: Negative for nausea and abdominal pain.  Genitourinary: Negative for dysuria.  Musculoskeletal: Negative for myalgias.  Skin: Negative for rash.  Neurological: Negative for tremors and headaches.  Psychiatric/Behavioral: Negative for  depression. The patient is not nervous/anxious.     Problem list and medications reviewed and updated by myself where necessary, and exist elsewhere in the encounter.   OBJECTIVE:  BP 131/87 mmHg  Pulse 125  Temp(Src) 98.3 F (36.8 C) (Oral)  Resp 16  Ht 4\' 8"  (1.422 m)  Wt 123 lb (55.792 kg)  BMI 27.59 kg/m2  SpO2 97% Estimated Creatinine Clearance: 35.8 mL/min (by C-G formula based on Cr of 0.75).  Physical Exam  Constitutional: She is oriented to person, place, and time.  Non-toxic appearance. No distress.  Cardiovascular: S1 normal, S2 normal and normal pulses.   No extrasystoles are present. Tachycardia present.  Exam reveals no gallop.   No murmur heard. Pulmonary/Chest: Effort normal. She has no decreased breath sounds. She has no wheezes. She has no rhonchi. She has no rales.  Neurological: She is alert and oriented to person, place, and time.  Skin: Skin is warm and dry. She is not diaphoretic.  Vitals reviewed.   Results for orders placed or performed in visit on 01/31/15 (from the past 48 hour(s))  POCT Microscopic Urinalysis ([facility])     Status: None   Collection Time: 01/31/15  9:25 AM  Result Value Ref Range   WBC,UR,HPF,POC None None WBC/hpf   RBC,UR,HPF,POC None None RBC/hpf   Bacteria None None, Too numerous to count   Mucus Absent Absent   Epithelial Cells, UR Per Microscopy None None, Too numerous to count cells/hpf  POCT urinalysis dipstick     Status: None   Collection Time: 01/31/15  9:25 AM  Result Value Ref Range   Color, UA yellow yellow   Clarity, UA clear clear   Glucose, UA negative negative  Bilirubin, UA negative negative   Ketones, POC UA negative negative   Spec Grav, UA 1.015    Blood, UA negative negative   pH, UA 5.5    Protein Ur, POC negative negative   Urobilinogen, UA 0.2    Nitrite, UA Negative Negative   Leukocytes, UA Negative Negative  POCT CBC     Status: Abnormal   Collection Time: 01/31/15  9:47 AM  Result Value Ref  Range   WBC 10.4 (A) 4.6 - 10.2 K/uL   Lymph, poc 1.1 0.6 - 3.4   POC LYMPH PERCENT 10.9 10 - 50 %L   MID (cbc) 0.5 0 - 0.9   POC MID % 4.5 0 - 12 %M   POC Granulocyte 8.8 (A) 2 - 6.9   Granulocyte percent 84.6 (A) 37 - 80 %G   RBC 3.92 (A) 4.04 - 5.48 M/uL   Hemoglobin 12.4 12.2 - 16.2 g/dL   HCT, POC 35.0 (A) 37.7 - 47.9 %   MCV 89.4 80 - 97 fL   MCH, POC 31.6 (A) 27 - 31.2 pg   MCHC 35.4 31.8 - 35.4 g/dL   RDW, POC 14.1 %   Platelet Count, POC 210 142 - 424 K/uL   MPV 7.1 0 - 99.8 fL  POCT glucose (manual entry)     Status: None   Collection Time: 01/31/15  9:47 AM  Result Value Ref Range   POC Glucose 99 70 - 99 mg/dl   CBC Latest Ref Rng 01/31/2015 01/16/2015 06/17/2014  WBC 4.6 - 10.2 K/uL 10.4(A) 17.2(A) 7.8  Hemoglobin 12.2 - 16.2 g/dL 12.4 12.2 12.2  Hematocrit 37.7 - 47.9 % 35.0(A) 36.0(A) 35.9(L)  Platelets 150.0 - 400.0 K/uL - - 189.0     ASSESSMENT AND PLAN  Zellie was seen today for follow-up.  Diagnoses and all orders for this visit:  Follow up: It appears that her UTI has resolved per today's urine tests, however given problem 2 I will culture to ensure the infection has been eradicated.   -     POCT Microscopic Urinalysis ([facility]) -     POCT urinalysis dipstick -     Urine culture  Tachycardia: This is new and appears to have began since the onset of her UTI.  Given her age and frail state will send to [facility] ED for further work up.   -     POCT CBC -     TSH -     POCT glucose (manual entry) -     Care order/instruction -     EKG 12-Lead    The patient was advised to call or return to clinic if she does not see an improvement in symptoms or to seek the care of the closest emergency department if she worsens with the above plan.   Philis Fendt, MHS, PA-C [facility] 01/31/2015 9:53 AM

## 2015-01-31 NOTE — ED Notes (Signed)
Report given to [address] 

## 2015-01-31 NOTE — ED Provider Notes (Signed)
CSN: [id number]     Arrival date & time 01/31/15  1112 History   First MD Initiated Contact with Patient 01/31/15 1124     Chief Complaint  Patient presents with  . Tachycardia     (Consider location/radiation/quality/duration/timing/severity/associated sxs/prior Treatment) HPI  81 year old female presents from urgent care with weakness. She states that she recently got over a UTI, last took antibiotics 4 days ago. Last night in the middle the night she felt acutely fatigued and weak. The weakness is generalized. Denies any numbness. No headaches, fevers, vomiting, abdominal pain, or chest pain. Denies any respiratory symptoms. She denies a palpitations. She was wondering if she had a repeat UTI so she went to urgent care. They noticed her heart rate was elevated and the 120s and sent her here for evaluation.  Past Medical History  Diagnosis Date  . Hypertension   . Venous insufficiency   . Hypercholesteremia   . Hiatal hernia   . Dysphagia   . Diverticulosis of colon   . Irritable bowel syndrome   . Colitis     lymphocytic colitis feb 2011  . Urinary incontinence   . DJD (degenerative joint disease)   . Lumbar back pain   . Osteoporosis   . Anxiety   . Anemia   . Iron deficiency   . Vitamin B12 deficiency   . Allergic rhinitis    Past Surgical History  Procedure Laterality Date  . Cataract surgery    . Abdominal hysterectomy    . Decompressive lumbar laminectomy  06/2008    at L2-3, L3-4 and L4-5 by Dr. Ronnald Ramp  . Laparoscopic cholecystectomy  04/2009    Dr. Abran Cantor  . Replacement total knee     No family history on file. Social History  Substance Use Topics  . Smoking status: Former Smoker -- 0.50 packs/day for 10 years    Types: Cigarettes  . Smokeless tobacco: Never Used  . Alcohol Use: No   OB History    No data available     Review of Systems  Constitutional: Positive for fatigue. Negative for fever.  Eyes: Negative for visual disturbance.  Respiratory:  Negative for shortness of breath.   Cardiovascular: Negative for chest pain and leg swelling.  Gastrointestinal: Negative for vomiting, abdominal pain and diarrhea.  Genitourinary: Positive for frequency. Negative for dysuria.  Musculoskeletal: Positive for back pain (chronic).  Neurological: Positive for weakness. Negative for dizziness and headaches.  All other systems reviewed and are negative.     Allergies  Amoxicillin-pot clavulanate and Sulfonamide derivatives  Home Medications   Prior to Admission medications   Medication Sig Start Date End Date Taking? Authorizing Provider  aspirin 81 MG tablet Take 81 mg by mouth daily.     Yes Historical Provider, MD  calcium-vitamin D (OSCAL WITH D) 500-200 MG-UNIT per tablet Take 1 tablet by mouth daily.    Yes Historical Provider, MD  cholecalciferol (VITAMIN D) 1000 UNITS tablet Take 1,000 Units by mouth daily.   Yes Historical Provider, MD  ciprofloxacin (CIPRO) 500 MG tablet Take 500 mg by mouth 2 (two) times daily. Filled 01-17-15 for 10 days   Yes Historical Provider, MD  diclofenac (FLECTOR) 1.3 % PTCH Place 1 patch onto the skin every 12 (twelve) hours. Apply as directed by Dr. Brien Few   Yes Historical Provider, MD  esomeprazole (NEXIUM) 40 MG capsule Take 1 capsule (40 mg total) by mouth daily. 07/23/14  Yes Rowe Clack, MD  Ferrous Sulfate Dried (FEOSOL) 200 (65  FE) MG TABS Take 1 tablet by mouth 2 (two) times daily. Take with a vitamin c 500mg     Yes Historical Provider, MD  fexofenadine (ALLEGRA) 180 MG tablet Take 180 mg by mouth daily as needed for allergies or rhinitis.   Yes Historical Provider, MD  oxyCODONE-acetaminophen (PERCOCET) 5-325 MG per tablet Take 1 tablet by mouth every 6 (six) hours as needed for moderate pain.    Yes Historical Provider, MD  polyethylene glycol (MIRALAX / GLYCOLAX) packet Take 17 g by mouth 2 (two) times daily. 10/22/14  Yes April Palumbo, MD  pregabalin (LYRICA) 50 MG capsule Take 1 capsule  (50 mg total) by mouth 3 (three) times daily. 10/22/14  Yes Rowe Clack, MD  simvastatin (ZOCOR) 40 MG tablet TAKE 1 TABLET AT BEDTIME 07/04/14  Yes Rowe Clack, MD  solifenacin (VESICARE) 10 MG tablet Take 1 tablet (10 mg total) by mouth daily. 06/17/14  Yes Rowe Clack, MD  vitamin B-12 (CYANOCOBALAMIN) 1000 MCG tablet Take 1 tablet (1,000 mcg total) by mouth daily. 06/17/14  Yes Rowe Clack, MD  docusate sodium (COLACE) 100 MG capsule Take 1 capsule (100 mg total) by mouth every 12 (twelve) hours. 10/22/14   April Palumbo, MD  PROLIA 60 MG/ML SOLN injection Inject 60 mg into the skin every 6 (six) months.  03/19/14   Historical Provider, MD   BP 140/106 mmHg  Pulse 121  Temp(Src) 98.8 F (37.1 C) (Rectal)  Resp 18  Ht 4\' 10"  (1.473 m)  Wt 123 lb (55.792 kg)  BMI 25.71 kg/m2  SpO2 95% Physical Exam  Constitutional: She is oriented to person, place, and time. She appears well-developed and well-nourished.  HENT:  Head: Normocephalic and atraumatic.  Right Ear: External ear normal.  Left Ear: External ear normal.  Nose: Nose normal.  Eyes: Right eye exhibits no discharge. Left eye exhibits no discharge.  Cardiovascular: Regular rhythm and normal heart sounds.  Tachycardia present.   Pulmonary/Chest: Effort normal and breath sounds normal.  Abdominal: Soft. There is tenderness (mild) in the suprapubic area.  Neurological: She is alert and oriented to person, place, and time.  CN 2-12 grossly intact. 5/5 strength in all 4 extremities.  Skin: Skin is warm and dry.  Nursing note and vitals reviewed.   ED Course  Procedures (including critical care time) Labs Review Labs Reviewed  CBC WITH DIFFERENTIAL/PLATELET - Abnormal; Notable for the following:    Neutro Abs 8.2 (*)    All other components within normal limits  COMPREHENSIVE METABOLIC PANEL - Abnormal; Notable for the following:    Total Protein 6.4 (*)    All other components within normal limits   URINE CULTURE  URINALYSIS, ROUTINE W REFLEX MICROSCOPIC (NOT AT [facility])  TSH  T4  I-STAT CG4 LACTIC ACID, ED  I-STAT TROPOININ, ED    Imaging Review Ct Abdomen Pelvis W Contrast  01/31/2015  CLINICAL DATA:  Tachycardia, lower abdominal pain. Recently treated for urinary tract infection. History of colitis, diverticulosis and hiatal hernia. EXAM: CT ABDOMEN AND PELVIS WITH CONTRAST TECHNIQUE: Multidetector CT imaging of the abdomen and pelvis was performed using the standard protocol following bolus administration of intravenous contrast. CONTRAST:  558mL OMNIPAQUE IOHEXOL 300 MG/ML  SOLN COMPARISON:  Previous exams including CT abdomen and pelvis dated 04/19/2009 and 08/29/2008. FINDINGS: Lower chest: There is mild bibasilar scarring/atelectasis and small bilateral pleural effusions. Moderate to large hiatal hernia is unchanged. Hepatobiliary: There is slightly increased intrahepatic and extrahepatic bile duct dilatation, common  bile duct now dilated to approximately 14 mm (previously measured at 12.5 mm). No obstructing bile duct stone or bile duct mass identified. There is a new enhancing lesion within the inferior left hepatic lobe, likely segment 4B, measuring 2.7 x 1.5 cm, worrisome for metastasis. No other enhancing mass or lesion identified within the liver. Patient is status post cholecystectomy. Pancreas: No mass, inflammatory changes, or other significant abnormality. No significant pancreatic duct dilatation. Spleen: There are numerous hypodense masses within the spleen, increased significantly in number compared to previous studies, and largest lesions have increased in size. Largest lesion within the posterior aspect of the spleen measures 2.8 x 2.7 cm (2.1 cm greatest dimension on the previous exam, and approximately 1.5 cm on the earlier exam of 08/29/2008). Next largest lesion within the anterior spleen measures 2.7 x 2.3 cm (measured at 2.4 cm greatest dimension on the previous exam). The  increase in size and number suggests neoplastic process. Adrenals/Urinary Tract: Left adrenal mass is slightly decreased in prominence measuring 3.2 x 2.1 cm on today's exam (previously measured at 3.4 x 2.5 cm). Right adrenal gland is normal. Both kidneys are unremarkable without stone or hydronephrosis. No ureteral or bladder calculi identified. Bladder is unremarkable. Stomach/Bowel: Bowel is normal in caliber. Scattered diverticulosis noted within the descending and sigmoid colon without evidence of acute diverticulitis. Bowel is difficult to definitively characterize without oral contrast but there is no obvious evidence of bowel wall thickening or bowel wall inflammation. Appendix is not seen but there are no inflammatory changes about the cecum to suggest acute appendicitis. Vascular/Lymphatic: Scattered atherosclerotic changes again noted along the walls of the ectatic but normal-caliber abdominal aorta. No acute vascular abnormality seen. No enlarged lymph nodes identified in the abdomen or pelvis. Reproductive: No mass or other significant abnormality. Other: No free fluid or abscess collections seen. No free intraperitoneal air. Heart is at least mildly enlarged, incompletely imaged at the upper portion of this exam. Musculoskeletal: Degenerative changes are seen throughout the slightly scoliotic thoracolumbar spine but no acute osseous abnormality. No evidence of osseous metastasis seen. Evidence of a previous surgical decompression of the central canal noted in the lower lumbar spine. IMPRESSION: 1. Increased number and size of the numerous splenic masses indicating neoplastic process, possibly lymphoma or metastasis as suggested on the previous exam. 2. Stable, or slightly smaller, left adrenal mass. This also remains suspicious for neoplastic process. 3. New enhancing lesion within the left hepatic lobe, segment 4B, measuring 2.8 x 1.5 cm. This is also suspicious for metastatic disease. 4. Mildly  increased intrahepatic and extrahepatic bile duct dilatation, likely benign in nature and commensurate to the previous cholecystectomy. No bile duct stone seen. No obstructing mass seen. Could consider correlation with liver function tests. 5. Colonic diverticulosis without evidence of acute diverticulitis. 6. Fairly large amount of stool throughout the nondistended colon (constipation? ). No evidence of bowel obstruction or focal bowel wall inflammation. 7. Hiatal hernia, moderate to large in size, grossly stable compared to previous exams. 8. Other chronic/incidental findings detailed above. Electronically Signed   By: Franki Cabot M.D.   On: 01/31/2015 15:28   I have personally reviewed and evaluated these images and lab results as part of my medical decision-making.   EKG Interpretation   Date/Time:  Friday January 31 2015 11:19:17 EST Ventricular Rate:  119 PR Interval:  150 QRS Duration: 121 QT Interval:  370 QTC Calculation: 521 R Axis:   -88 Text Interpretation:  Ectopic atrial tachycardia, unifocal Nonspecific  IVCD with LAD LVH with secondary repolarization abnormality rate is faster  compared to 2013 Confirmed by Morgan Rennert  MD, Skye Rodarte ([id number]) on 01/31/2015  11:27:12 AM      MDM   Final diagnoses:  Tachycardia    It is unclear why the patient is tachycardic. She has remained with a heart rate of 120 and during current time her stay. She has been given IV fluids with no relief. No focal weakness on exam to suggest stroke or acute neurologic dysfunction. No recurrent UTI or other infection. Given mild lower abdominal tenderness a CT was obtained which shows multiple lesions that have increased since 2011. This is suspicious for lymphoma or metastatic disease. Family and patient do not know about this and were informed of these results. I do not think this necessarily is causing her symptoms but could have some underlying relation. Will send thyroid studies as well. Given her heart  rate is still in the 120s do not feel couple setting this patient home and think she needs further workup. Admit to the hospitalist.    Sherwood Gambler, MD 01/31/15 [phone]

## 2015-02-01 ENCOUNTER — Encounter (HOSPITAL_COMMUNITY): Payer: Self-pay | Admitting: Radiology

## 2015-02-01 ENCOUNTER — Observation Stay (HOSPITAL_COMMUNITY): Payer: Medicare Other

## 2015-02-01 DIAGNOSIS — I484 Atypical atrial flutter: Secondary | ICD-10-CM | POA: Diagnosis not present

## 2015-02-01 DIAGNOSIS — I5031 Acute diastolic (congestive) heart failure: Secondary | ICD-10-CM | POA: Diagnosis not present

## 2015-02-01 DIAGNOSIS — R000 Tachycardia, unspecified: Secondary | ICD-10-CM

## 2015-02-01 DIAGNOSIS — I483 Typical atrial flutter: Secondary | ICD-10-CM | POA: Diagnosis not present

## 2015-02-01 DIAGNOSIS — R935 Abnormal findings on diagnostic imaging of other abdominal regions, including retroperitoneum: Secondary | ICD-10-CM

## 2015-02-01 DIAGNOSIS — I5021 Acute systolic (congestive) heart failure: Secondary | ICD-10-CM | POA: Diagnosis not present

## 2015-02-01 DIAGNOSIS — I2510 Atherosclerotic heart disease of native coronary artery without angina pectoris: Secondary | ICD-10-CM | POA: Diagnosis not present

## 2015-02-01 DIAGNOSIS — K7689 Other specified diseases of liver: Secondary | ICD-10-CM | POA: Diagnosis not present

## 2015-02-01 DIAGNOSIS — K5900 Constipation, unspecified: Secondary | ICD-10-CM | POA: Diagnosis not present

## 2015-02-01 LAB — T4: T4, Total: 6.4 ug/dL (ref 4.5–12.0)

## 2015-02-01 LAB — URINE CULTURE
COLONY COUNT: NO GROWTH
Organism ID, Bacteria: NO GROWTH

## 2015-02-01 LAB — AFP TUMOR MARKER: AFP-Tumor Marker: 2.4 ng/mL (ref 0.0–8.3)

## 2015-02-01 LAB — CEA: CEA: 1.5 ng/mL (ref 0.0–4.7)

## 2015-02-01 LAB — BRAIN NATRIURETIC PEPTIDE: B Natriuretic Peptide: 421.8 pg/mL — ABNORMAL HIGH (ref 0.0–100.0)

## 2015-02-01 LAB — CANCER ANTIGEN 19-9: CA 19-9: 18 U/mL (ref 0–35)

## 2015-02-01 MED ORDER — AMIODARONE HCL IN DEXTROSE 360-4.14 MG/200ML-% IV SOLN
60.0000 mg/h | INTRAVENOUS | Status: AC
  Filled 2015-02-01: qty 200

## 2015-02-01 MED ORDER — IOHEXOL 350 MG/ML SOLN
100.0000 mL | Freq: Once | INTRAVENOUS | Status: AC | PRN
  Administered 2015-02-01: 70 mL via INTRAVENOUS

## 2015-02-01 MED ORDER — HEPARIN (PORCINE) IN NACL 100-0.45 UNIT/ML-% IJ SOLN
1150.0000 [IU]/h | INTRAMUSCULAR | Status: DC
Start: 2015-02-01 — End: 2015-02-04
  Administered 2015-02-02 – 2015-02-03 (×2): 1150 [IU]/h via INTRAVENOUS
  Filled 2015-02-01 (×3): qty 250

## 2015-02-01 MED ORDER — AMIODARONE HCL IN DEXTROSE 360-4.14 MG/200ML-% IV SOLN
30.0000 mg/h | INTRAVENOUS | Status: DC
  Administered 2015-02-02 – 2015-02-04 (×5): 30 mg/h via INTRAVENOUS
  Filled 2015-02-01 (×6): qty 200

## 2015-02-01 MED ORDER — METOPROLOL TARTRATE 1 MG/ML IV SOLN
5.0000 mg | Freq: Once | INTRAVENOUS | Status: AC
  Administered 2015-02-01: 5 mg via INTRAVENOUS

## 2015-02-01 MED ORDER — HEPARIN BOLUS VIA INFUSION
2000.0000 [IU] | Freq: Once | INTRAVENOUS | Status: AC
  Administered 2015-02-01: 2000 [IU] via INTRAVENOUS
  Filled 2015-02-01: qty 2000

## 2015-02-01 MED ORDER — METOPROLOL TARTRATE 12.5 MG HALF TABLET
12.5000 mg | ORAL_TABLET | Freq: Two times a day (BID) | ORAL | Status: DC
  Administered 2015-02-01 – 2015-02-02 (×4): 12.5 mg via ORAL
  Filled 2015-02-01 (×4): qty 1

## 2015-02-01 MED ORDER — METOPROLOL TARTRATE 1 MG/ML IV SOLN
INTRAVENOUS | Status: AC
  Filled 2015-02-01: qty 5

## 2015-02-01 MED ORDER — AMIODARONE LOAD VIA INFUSION
150.0000 mg | Freq: Once | INTRAVENOUS | Status: AC
  Administered 2015-02-01: 150 mg via INTRAVENOUS
  Filled 2015-02-01: qty 83.34

## 2015-02-01 NOTE — Evaluation (Signed)
Physical Therapy Evaluation Patient Details Name: Cathy [name] MRN: [id number] DOB: 1927/05/30 Today's Date: 02/01/2015   History of Present Illness  Cathy [name] is a 81 y.o. female who was recently treated for a UTI. Patient return to urgent care today for follow-up. Told urinalysis was normal but patient found to be tachycardic. She was advised to come to the ED. Patient denies chest pains or shortness of breath. No dizziness Patient is physically active and lives independently. Per daughter at bedside, patient cleans the house on a daily basis. Patient states her appetite at home has been fine, no unexpected weight loss. No fevers or chills. She has chronic constipation  Clinical Impression  Pt admitted with above diagnosis. Pt currently with functional limitations due to the deficits listed below (see PT Problem List). Pt able to ambulate with RW with good safety.  Was using cane PTA.  Should progress and be able to go home but would recommend HHPT safety eval.  Pt reports she will use her RW initially.  Will follow acutely.  Pt will benefit from skilled PT to increase their independence and safety with mobility to allow discharge to the venue listed below.      Follow Up Recommendations Home health PT;Supervision - Intermittent (safety eval recommended)    Equipment Recommendations  None recommended by PT    Recommendations for Other Services       Precautions / Restrictions Precautions Precautions: Fall Restrictions Weight Bearing Restrictions: No      Mobility  Bed Mobility Overal bed mobility: Independent                Transfers Overall transfer level: Needs assistance Equipment used: Rolling walker (2 wheeled) Transfers: Sit to/from Stand Sit to Stand: Supervision         General transfer comment: cues for hand placement   Ambulation/Gait Ambulation/Gait assistance: Min guard Ambulation Distance (Feet): 280 Feet Assistive device: Rolling walker (2  wheeled) Gait Pattern/deviations: Step-through pattern;Decreased stride length;Trunk flexed;Wide base of support   Gait velocity interpretation: <1.8 ft/sec, indicative of risk for recurrent falls General Gait Details: Pt states she feels she is close to baseline but did use a cane PTA and is now needing a RW.  Pt is safe wwith RW without LOB.  Did walk short distance without RW and really needs the RW at this time.  PT agrees to use RW until she feels she is back to her baseline.    Stairs            Wheelchair Mobility    Modified Rankin (Stroke Patients Only)       Balance Overall balance assessment: Needs assistance Sitting-balance support: No upper extremity supported;Feet supported Sitting balance-Leahy Scale: Good     Standing balance support: Bilateral upper extremity supported;During functional activity Standing balance-Leahy Scale: Poor Standing balance comment: Pt needs UE support at this time for balance.                               Pertinent Vitals/Pain Pain Assessment: No/denies pain  HR 120-122 bpm with activity    Home Living Family/patient expects to be discharged to:: Private residence Living Arrangements: Alone Available Help at Discharge: Family;Available PRN/intermittently Type of Home: House Home Access: Stairs to enter Entrance Stairs-Rails: Right;Left;Can reach both Entrance Stairs-Number of Steps: 5 Home Layout: One level Home Equipment: Cane - single point;Walker - 2 wheels;Bedside commode;Shower seat  Prior Function Level of Independence: Independent with assistive device(s)         Comments: used cane most of time per pt report     Hand Dominance        Extremity/Trunk Assessment   Upper Extremity Assessment: Defer to OT evaluation           Lower Extremity Assessment: Generalized weakness      Cervical / Trunk Assessment: Kyphotic  Communication   Communication: HOH  Cognition Arousal/Alertness:  Awake/alert Behavior During Therapy: WFL for tasks assessed/performed Overall Cognitive Status: Within Functional Limits for tasks assessed                      General Comments      Exercises        Assessment/Plan    PT Assessment Patient needs continued PT services  PT Diagnosis Generalized weakness   PT Problem List Decreased activity tolerance;Decreased balance;Decreased mobility;Decreased knowledge of use of DME;Decreased safety awareness;Decreased knowledge of precautions  PT Treatment Interventions DME instruction;Gait training;Stair training;Functional mobility training;Therapeutic activities;Therapeutic exercise;Balance training;Patient/family education   PT Goals (Current goals can be found in the Care Plan section) Acute Rehab PT Goals Patient Stated Goal: to go home PT Goal Formulation: With patient Time For Goal Achievement: 02/15/15 Potential to Achieve Goals: Good    Frequency Min 3X/week   Barriers to discharge        Co-evaluation               End of Session Equipment Utilized During Treatment: Gait belt Activity Tolerance: Patient tolerated treatment well Patient left: in chair;with call bell/phone within reach Nurse Communication: Mobility status    Functional Assessment Tool Used: clinical judgment Functional Limitation: Mobility: Walking and moving around Mobility: Walking and Moving Around Current Status [phone]): At least 1 percent but less than 20 percent impaired, limited or restricted Mobility: Walking and Moving Around Goal Status [phone]): At least 1 percent but less than 20 percent impaired, limited or restricted    Time: 0916-0934 PT Time Calculation (min) (ACUTE ONLY): 18 min   Charges:   PT Evaluation $Initial PT Evaluation Tier I: 1 Procedure     PT G Codes:   PT G-Codes **NOT FOR INPATIENT CLASS** Functional Assessment Tool Used: clinical judgment Functional Limitation: Mobility: Walking and moving  around Mobility: Walking and Moving Around Current Status [phone]): At least 1 percent but less than 20 percent impaired, limited or restricted Mobility: Walking and Moving Around Goal Status [phone]): At least 1 percent but less than 20 percent impaired, limited or restricted    Irwin Brakeman F 02/01/2015, 11:44 AM [facility] [phone] (pager)

## 2015-02-01 NOTE — Progress Notes (Signed)
[facility] PROGRESS NOTE  Cathy [name] [id number] DOB: 11/14/1927 DOA: 01/31/2015 PCP: Gwendolyn Grant, MD  Assessment/Plan: 1. Atrial flutter - EKG shows atrial flutter with 2:1 block , will obtain cardiology consultation. Patient has been started on metoprolol 12.5 mg by mouth twice a day. 2. ? Liver and spleen metastasis - CT scan of the abdomen showed multiple masses in the spleen which are now larger than previous CT scan. Also shows a mass in the left lobe of the liver.  All the tumor markers obtained in the hospital are negative. I called and discussed with oncology  Dr Lindi Adie, who will see the patient in his clinic next week to discuss further testing. Patient has had these lesions seen on the CT scan of the abdomen since 2010. 3. Elevated d-dimer - patient has relatively d-dimer, will obtain CT angiogram as well as lower extremity venous duplex to rule out DVT. Patient has been complaining of intermittent swelling of the left thigh for quite some time.  4.  DVT prophylaxis- Lovenox  Code Status:  DO NOT RESUSCITATE  Family Communication: *spoke to patient's daughter at bedside  Disposition pending improvement in patient's heart rate   Consultants:  Cardiology   Procedures:   none    Antibiotics       none     HPI/Subjective: 81 y.o. female who was recently treated for a UTI. Patient return to urgent care today for follow-up. Told urinalysis was normal but patient found to be tachycardic. She was advised to come to the ED. Patient denies chest pains or shortness of breath. No dizziness Patient is physically active and lives independently. Per daughter at bedside, patient cleans the house on a daily basis. Patient states her appetite at home has been fine, no unexpected weight loss. No fevers or chills. She has chronic constipation.   Patient continues to have tachycardia with heart rate 120s. EKG this morning shows atrial flutter with 2:1 block    Objective: Filed Vitals:   02/01/15 0444 02/01/15 0630  BP: 128/97 141/97  Pulse: 121 124  Temp: 98.6 F (37 C)   Resp: 18     Intake/Output Summary (Last 24 hours) at 02/01/15 1400 Last data filed at 01/31/15 1640  Gross per 24 hour  Intake   1000 ml  Output      0 ml  Net   1000 ml   Filed Weights   01/31/15 1117  Weight: 55.792 kg (123 lb)    Exam:   General: appears in no acute distress   Cardiovascular: S1-S2 regular   Respiratory:  clear to auscultation bilaterally   Abdomen: Soft, nontender, no organomegaly   Musculoskeletal:  no cyanosis/clubbing/edema of lower extremities    Data Reviewed: Basic Metabolic Panel:  Recent Labs Lab 01/31/15 1149 01/31/15 1946  NA 142  --   K 4.5  --   CL 107  --   CO2 30  --   GLUCOSE 92  --   BUN 10  --   CREATININE 0.73 0.70  CALCIUM 9.6  --   MG  --  2.0   Liver Function Tests:  Recent Labs Lab 01/31/15 1149  AST 24  ALT 20  ALKPHOS 58  BILITOT 0.5  PROT 6.4*  ALBUMIN 3.5   No results for input(s): LIPASE, AMYLASE in the last 168 hours. No results for input(s): AMMONIA in the last 168 hours. CBC:  Recent Labs Lab 01/31/15 0947 01/31/15 1149 01/31/15 1946  WBC 10.4* 9.8 8.3  NEUTROABS  --  8.2*  --   HGB 12.4 12.7 11.3*  HCT 35.0* 39.5 35.8*  MCV 89.4 95.6 96.0  PLT  --  204 215    Recent Results (from the past 240 hour(s))  Urine culture     Status: None   Collection Time: 01/31/15  9:42 AM  Result Value Ref Range Status   Colony Count NO GROWTH  Final   Organism ID, Bacteria NO GROWTH  Final  Urine culture     Status: None (Preliminary result)   Collection Time: 01/31/15 11:25 AM  Result Value Ref Range Status   Specimen Description URINE, RANDOM  Final   Special Requests NONE  Final   Culture CULTURE REINCUBATED FOR BETTER GROWTH  Final   Report Status PENDING  Incomplete     Studies: Ct Angio Chest Pe W/cm &/or Wo Cm  02/01/2015  CLINICAL DATA:  Tachycardia. EXAM: CT  ANGIOGRAPHY CHEST WITH CONTRAST TECHNIQUE: Multidetector CT imaging of the chest was performed using the standard protocol during bolus administration of intravenous contrast. Multiplanar CT image reconstructions and MIPs were obtained to evaluate the vascular anatomy. CONTRAST:  65mL OMNIPAQUE IOHEXOL 350 MG/ML SOLN COMPARISON:  None FINDINGS: THORACIC INLET/BODY WALL: No acute abnormality. MEDIASTINUM: Cardiomegaly with particularly notable right atrial enlargement. There is mitral annular ossification. Atherosclerosis, including the coronary arteries. Limited opacification of the aorta without evidence of acute aortic disease. No pericardial effusion. Large main pulmonary arteries, with the right branch measuring 28 mm. This could reflect pulmonary hypertension. No evidence of pulmonary embolism. Large hiatal hernia. LUNG WINDOWS: Small pleural effusions with basilar atelectasis. No indication of pneumonia or edema. UPPER ABDOMEN: No acute findings. Low-density 3 cm mass in the left adrenal gland is stable from 2010 myelogram and consistent with adenoma. OSSEOUS: Severe right glenohumeral arthropathy with joint effusion and degenerative cyst in the subscapular fossa. Severe and diffuse thoracic disc degeneration. No acute osseous finding. Review of the MIP images confirms the above findings. IMPRESSION: 1. Negative for pulmonary embolism. 2. Small pleural effusions and bibasilar atelectasis. 3. Large hiatal hernia. Electronically Signed   By: Monte Fantasia M.D.   On: 02/01/2015 11:31   Ct Abdomen Pelvis W Contrast  01/31/2015  CLINICAL DATA:  Tachycardia, lower abdominal pain. Recently treated for urinary tract infection. History of colitis, diverticulosis and hiatal hernia. EXAM: CT ABDOMEN AND PELVIS WITH CONTRAST TECHNIQUE: Multidetector CT imaging of the abdomen and pelvis was performed using the standard protocol following bolus administration of intravenous contrast. CONTRAST:  540mL OMNIPAQUE IOHEXOL  300 MG/ML  SOLN COMPARISON:  Previous exams including CT abdomen and pelvis dated 04/19/2009 and 08/29/2008. FINDINGS: Lower chest: There is mild bibasilar scarring/atelectasis and small bilateral pleural effusions. Moderate to large hiatal hernia is unchanged. Hepatobiliary: There is slightly increased intrahepatic and extrahepatic bile duct dilatation, common bile duct now dilated to approximately 14 mm (previously measured at 12.5 mm). No obstructing bile duct stone or bile duct mass identified. There is a new enhancing lesion within the inferior left hepatic lobe, likely segment 4B, measuring 2.7 x 1.5 cm, worrisome for metastasis. No other enhancing mass or lesion identified within the liver. Patient is status post cholecystectomy. Pancreas: No mass, inflammatory changes, or other significant abnormality. No significant pancreatic duct dilatation. Spleen: There are numerous hypodense masses within the spleen, increased significantly in number compared to previous studies, and largest lesions have increased in size. Largest lesion within the posterior aspect of the spleen measures 2.8 x 2.7 cm (2.1 cm greatest  dimension on the previous exam, and approximately 1.5 cm on the earlier exam of 08/29/2008). Next largest lesion within the anterior spleen measures 2.7 x 2.3 cm (measured at 2.4 cm greatest dimension on the previous exam). The increase in size and number suggests neoplastic process. Adrenals/Urinary Tract: Left adrenal mass is slightly decreased in prominence measuring 3.2 x 2.1 cm on today's exam (previously measured at 3.4 x 2.5 cm). Right adrenal gland is normal. Both kidneys are unremarkable without stone or hydronephrosis. No ureteral or bladder calculi identified. Bladder is unremarkable. Stomach/Bowel: Bowel is normal in caliber. Scattered diverticulosis noted within the descending and sigmoid colon without evidence of acute diverticulitis. Bowel is difficult to definitively characterize without  oral contrast but there is no obvious evidence of bowel wall thickening or bowel wall inflammation. Appendix is not seen but there are no inflammatory changes about the cecum to suggest acute appendicitis. Vascular/Lymphatic: Scattered atherosclerotic changes again noted along the walls of the ectatic but normal-caliber abdominal aorta. No acute vascular abnormality seen. No enlarged lymph nodes identified in the abdomen or pelvis. Reproductive: No mass or other significant abnormality. Other: No free fluid or abscess collections seen. No free intraperitoneal air. Heart is at least mildly enlarged, incompletely imaged at the upper portion of this exam. Musculoskeletal: Degenerative changes are seen throughout the slightly scoliotic thoracolumbar spine but no acute osseous abnormality. No evidence of osseous metastasis seen. Evidence of a previous surgical decompression of the central canal noted in the lower lumbar spine. IMPRESSION: 1. Increased number and size of the numerous splenic masses indicating neoplastic process, possibly lymphoma or metastasis as suggested on the previous exam. 2. Stable, or slightly smaller, left adrenal mass. This also remains suspicious for neoplastic process. 3. New enhancing lesion within the left hepatic lobe, segment 4B, measuring 2.8 x 1.5 cm. This is also suspicious for metastatic disease. 4. Mildly increased intrahepatic and extrahepatic bile duct dilatation, likely benign in nature and commensurate to the previous cholecystectomy. No bile duct stone seen. No obstructing mass seen. Could consider correlation with liver function tests. 5. Colonic diverticulosis without evidence of acute diverticulitis. 6. Fairly large amount of stool throughout the nondistended colon (constipation? ). No evidence of bowel obstruction or focal bowel wall inflammation. 7. Hiatal hernia, moderate to large in size, grossly stable compared to previous exams. 8. Other chronic/incidental findings  detailed above. Electronically Signed   By: Franki Cabot M.D.   On: 01/31/2015 15:28    Scheduled Meds: . aspirin EC  81 mg Oral Daily  . enoxaparin (LOVENOX) injection  40 mg Subcutaneous Q24H  . metoprolol tartrate  12.5 mg Oral BID  . pantoprazole  40 mg Oral Q1200  . polyethylene glycol  17 g Oral BID  . pregabalin  50 mg Oral TID  . simvastatin  40 mg Oral q1800  . sodium chloride  1,000 mL Intravenous Once  . sodium chloride  3 mL Intravenous Q12H   Continuous Infusions: . sodium chloride 75 mL/hr at 02/01/15 0840    Active Problems:   Constipation   Tachycardia   Fatigue   Abnormal CT of the abdomen   Prolonged Q-T interval on ECG   Sinus tachycardia (Center)    Time spent: 25 min    [facility] Pager [phone]. If 7PM-7AM, please contact night-coverage at [email or web address], password [email or web address] 02/01/2015, 2:00 PM

## 2015-02-01 NOTE — Progress Notes (Signed)
EKG this AM showed a flutter.  VSS, NP Callahan paged/notfied.  New orders received.  Will cont to monitor pt closely.  Claudette Stapler, RN

## 2015-02-01 NOTE — Consult Note (Addendum)
Name: Cathy [name] is a 81 y.o. female Admit date: 01/31/2015 Referring Physician:  Darrick Meigs, MD Primary Physician:  Gwendolyn Grant, M.D. Primary Cardiologist:  None  Reason for Consultation:  Atrial tachycardia/flutter  ASSESSMENT: 1. Atrial flutter with 2:1 AV conduction and ventricular rate of 122 bpm. This rhythm is new since 2013 2. Possible metastatic cancer with unknown primary 3. Elderly, somewhat frail, but independently living 4. Hypertension 5. Fatigue, likely secondary to #1  PLAN: 1. In this setting a 2-D Doppler echocardiogram is helpful to exclude the possibility of pericardial spread of possible cancer. Note however that effusion was not noted on CT scan. 2. Will use amiodarone to help slow rate and potentially convert rhythm. Will start amiodarone intravenously initially 3. BNP 4. IV heparin and watch hemoglobin closely This patients CHA2DS2-VASc Score and unadjusted Ischemic Stroke Rate (% per year) is equal to 3.2 % stroke rate/year from a score of 3  Above score calculated as 1 point each if present [CHF, HTN, DM, Vascular=MI/PAD/Aortic Plaque, Age if 65-74, or Female] Above score calculated as 2 points each if present [Age > 75, or Stroke/TIA/TE]   HPI: 81 year old with some memory difficulty admitted to the hospital with a complaint of fatigue after being found to have a heart rate in the 120s at an urgent care center. She denies chest discomfort. No significant dyspnea has occurred. She has not had syncope, orthopnea, but has noted some mild lower extremity edema. She denies palpitations. There is no prior history of heart disease.  She has not normal CT scan raises the possibility of a metastatic cancer. No primary has been identified.  PMH:   Past Medical History  Diagnosis Date  . Hypertension   . Venous insufficiency   . Hypercholesteremia   . Hiatal hernia   . Dysphagia   . Diverticulosis of colon   . Irritable bowel syndrome   . Colitis    lymphocytic colitis feb 2011  . Urinary incontinence   . DJD (degenerative joint disease)   . Lumbar back pain   . Osteoporosis   . Anxiety   . Anemia   . Iron deficiency   . Vitamin B12 deficiency   . Allergic rhinitis     PSH:   Past Surgical History  Procedure Laterality Date  . Cataract surgery    . Abdominal hysterectomy    . Decompressive lumbar laminectomy  06/2008    at L2-3, L3-4 and L4-5 by Dr. Ronnald Ramp  . Laparoscopic cholecystectomy  04/2009    Dr. Abran Cantor  . Replacement total knee     Allergies:  Amoxicillin-pot clavulanate and Sulfonamide derivatives Prior to Admit Meds:   Prescriptions prior to admission  Medication Sig Dispense Refill Last Dose  . aspirin 81 MG tablet Take 81 mg by mouth daily.     01/31/2015 at Unknown time  . calcium-vitamin D (OSCAL WITH D) 500-200 MG-UNIT per tablet Take 1 tablet by mouth daily.    01/31/2015 at Unknown time  . cholecalciferol (VITAMIN D) 1000 UNITS tablet Take 1,000 Units by mouth daily.   01/31/2015 at Unknown time  . ciprofloxacin (CIPRO) 500 MG tablet Take 500 mg by mouth 2 (two) times daily. Filled 01-17-15 for 10 days   01/24/2015  . diclofenac (FLECTOR) 1.3 % PTCH Place 1 patch onto the skin every 12 (twelve) hours. Apply as directed by Dr. Brien Few   01/31/2015 at Unknown time  . esomeprazole (NEXIUM) 40 MG capsule Take 1 capsule (40 mg total) by mouth daily.  90 capsule 3 01/31/2015 at Unknown time  . Ferrous Sulfate Dried (FEOSOL) 200 (65 FE) MG TABS Take 1 tablet by mouth 2 (two) times daily. Take with a vitamin c 500mg     01/31/2015 at Unknown time  . fexofenadine (ALLEGRA) 180 MG tablet Take 180 mg by mouth daily as needed for allergies or rhinitis.   01/31/2015 at Unknown time  . oxyCODONE-acetaminophen (PERCOCET) 5-325 MG per tablet Take 1 tablet by mouth every 6 (six) hours as needed for moderate pain.    Past Week at Unknown time  . polyethylene glycol (MIRALAX / GLYCOLAX) packet Take 17 g by mouth 2 (two) times daily. 28  each 0 01/31/2015 at Unknown time  . pregabalin (LYRICA) 50 MG capsule Take 1 capsule (50 mg total) by mouth 3 (three) times daily. 270 capsule 1 01/31/2015 at Unknown time  . simvastatin (ZOCOR) 40 MG tablet TAKE 1 TABLET AT BEDTIME 90 tablet 3 01/31/2015 at Unknown time  . solifenacin (VESICARE) 10 MG tablet Take 1 tablet (10 mg total) by mouth daily. 90 tablet 1 01/31/2015 at Unknown time  . vitamin B-12 (CYANOCOBALAMIN) 1000 MCG tablet Take 1 tablet (1,000 mcg total) by mouth daily.   01/31/2015 at Unknown time  . docusate sodium (COLACE) 100 MG capsule Take 1 capsule (100 mg total) by mouth every 12 (twelve) hours. 60 capsule 0 Taking  . PROLIA 60 MG/ML SOLN injection Inject 60 mg into the skin every 6 (six) months.    07-2014   Fam HX:   No family history on file. Social HX:    Social History   Social History  . Marital Status: Widowed    Spouse Name: N/A  . Number of Children: N/A  . Years of Education: N/A   Occupational History  . retired    Social History Main Topics  . Smoking status: Former Smoker -- 0.50 packs/day for 10 years    Types: Cigarettes  . Smokeless tobacco: Never Used  . Alcohol Use: No  . Drug Use: No  . Sexual Activity: No   Other Topics Concern  . Not on file   Social History Narrative     Review of Systems: Hard of hearing. No history of gastrointestinal bleeding.  Physical Exam: Blood pressure 120/77, pulse 126, temperature 97.7 F (36.5 C), temperature source Oral, resp. rate 17, height 4\' 10"  (1.473 m), weight 123 lb (55.792 kg), SpO2 96 %. Weight change:    Nice elderly female in no acute distress Respiratory rate is normal Neck veins are moderately distended with patient lying at 45. No carotid bruits are heard. Faint basal crackles bilaterally Cardiac exam reveals a rapid regular rhythm. No obvious murmur or gallop. Abdomen soft and nontender Extremities reveal trace bilateral edema Neurological exam reveals decreased hearing. Decreased  memory. No focal motor or sensory deficits are noted.  Labs: Lab Results  Component Value Date   WBC 8.3 01/31/2015   HGB 11.3* 01/31/2015   HCT 35.8* 01/31/2015   MCV 96.0 01/31/2015   PLT 215 01/31/2015    Recent Labs Lab 01/31/15 1149 01/31/15 1946  NA 142  --   K 4.5  --   CL 107  --   CO2 30  --   BUN 10  --   CREATININE 0.73 0.70  CALCIUM 9.6  --   PROT 6.4*  --   BILITOT 0.5  --   ALKPHOS 58  --   ALT 20  --   AST 24  --  GLUCOSE 92  --    No results found for: PTT Lab Results  Component Value Date   INR 1.33 03/09/2010   INR 1.31 03/08/2010   INR 1.28 03/07/2010   Lab Results  Component Value Date   CKTOTAL 16 04/19/2009   CKMB 0.6 04/19/2009   TROPONINI 0.02        NO INDICATION OF MYOCARDIAL INJURY. 04/19/2009     Lab Results  Component Value Date   CHOL 153 12/10/2013   CHOL 156 12/07/2012   CHOL 145 04/28/2011   Lab Results  Component Value Date   HDL 67.00 12/10/2013   HDL 85.90 12/07/2012   HDL 80.00 04/28/2011   Lab Results  Component Value Date   LDLCALC 75 12/10/2013   LDLCALC 62 12/07/2012   LDLCALC 59 04/28/2011   Lab Results  Component Value Date   TRIG 53.0 12/10/2013   TRIG 39.0 12/07/2012   TRIG 31.0 04/28/2011   Lab Results  Component Value Date   CHOLHDL 2 12/10/2013   CHOLHDL 2 12/07/2012   CHOLHDL 2 04/28/2011   Lab Results  Component Value Date   LDLDIRECT 158.1 05/29/2008   LDLDIRECT 117.5 06/24/2006      Radiology:  Ct Angio Chest Pe W/cm &/or Wo Cm  02/01/2015  CLINICAL DATA:  Tachycardia. EXAM: CT ANGIOGRAPHY CHEST WITH CONTRAST TECHNIQUE: Multidetector CT imaging of the chest was performed using the standard protocol during bolus administration of intravenous contrast. Multiplanar CT image reconstructions and MIPs were obtained to evaluate the vascular anatomy. CONTRAST:  38mL OMNIPAQUE IOHEXOL 350 MG/ML SOLN COMPARISON:  None FINDINGS: THORACIC INLET/BODY WALL: No acute abnormality. MEDIASTINUM:  Cardiomegaly with particularly notable right atrial enlargement. There is mitral annular ossification. Atherosclerosis, including the coronary arteries. Limited opacification of the aorta without evidence of acute aortic disease. No pericardial effusion. Large main pulmonary arteries, with the right branch measuring 28 mm. This could reflect pulmonary hypertension. No evidence of pulmonary embolism. Large hiatal hernia. LUNG WINDOWS: Small pleural effusions with basilar atelectasis. No indication of pneumonia or edema. UPPER ABDOMEN: No acute findings. Low-density 3 cm mass in the left adrenal gland is stable from 2010 myelogram and consistent with adenoma. OSSEOUS: Severe right glenohumeral arthropathy with joint effusion and degenerative cyst in the subscapular fossa. Severe and diffuse thoracic disc degeneration. No acute osseous finding. Review of the MIP images confirms the above findings. IMPRESSION: 1. Negative for pulmonary embolism. 2. Small pleural effusions and bibasilar atelectasis. 3. Large hiatal hernia. Electronically Signed   By: Monte Fantasia M.D.   On: 02/01/2015 11:31   Ct Abdomen Pelvis W Contrast  01/31/2015  CLINICAL DATA:  Tachycardia, lower abdominal pain. Recently treated for urinary tract infection. History of colitis, diverticulosis and hiatal hernia. EXAM: CT ABDOMEN AND PELVIS WITH CONTRAST TECHNIQUE: Multidetector CT imaging of the abdomen and pelvis was performed using the standard protocol following bolus administration of intravenous contrast. CONTRAST:  5104mL OMNIPAQUE IOHEXOL 300 MG/ML  SOLN COMPARISON:  Previous exams including CT abdomen and pelvis dated 04/19/2009 and 08/29/2008. FINDINGS: Lower chest: There is mild bibasilar scarring/atelectasis and small bilateral pleural effusions. Moderate to large hiatal hernia is unchanged. Hepatobiliary: There is slightly increased intrahepatic and extrahepatic bile duct dilatation, common bile duct now dilated to approximately 14 mm  (previously measured at 12.5 mm). No obstructing bile duct stone or bile duct mass identified. There is a new enhancing lesion within the inferior left hepatic lobe, likely segment 4B, measuring 2.7 x 1.5 cm, worrisome for metastasis. No  other enhancing mass or lesion identified within the liver. Patient is status post cholecystectomy. Pancreas: No mass, inflammatory changes, or other significant abnormality. No significant pancreatic duct dilatation. Spleen: There are numerous hypodense masses within the spleen, increased significantly in number compared to previous studies, and largest lesions have increased in size. Largest lesion within the posterior aspect of the spleen measures 2.8 x 2.7 cm (2.1 cm greatest dimension on the previous exam, and approximately 1.5 cm on the earlier exam of 08/29/2008). Next largest lesion within the anterior spleen measures 2.7 x 2.3 cm (measured at 2.4 cm greatest dimension on the previous exam). The increase in size and number suggests neoplastic process. Adrenals/Urinary Tract: Left adrenal mass is slightly decreased in prominence measuring 3.2 x 2.1 cm on today's exam (previously measured at 3.4 x 2.5 cm). Right adrenal gland is normal. Both kidneys are unremarkable without stone or hydronephrosis. No ureteral or bladder calculi identified. Bladder is unremarkable. Stomach/Bowel: Bowel is normal in caliber. Scattered diverticulosis noted within the descending and sigmoid colon without evidence of acute diverticulitis. Bowel is difficult to definitively characterize without oral contrast but there is no obvious evidence of bowel wall thickening or bowel wall inflammation. Appendix is not seen but there are no inflammatory changes about the cecum to suggest acute appendicitis. Vascular/Lymphatic: Scattered atherosclerotic changes again noted along the walls of the ectatic but normal-caliber abdominal aorta. No acute vascular abnormality seen. No enlarged lymph nodes identified  in the abdomen or pelvis. Reproductive: No mass or other significant abnormality. Other: No free fluid or abscess collections seen. No free intraperitoneal air. Heart is at least mildly enlarged, incompletely imaged at the upper portion of this exam. Musculoskeletal: Degenerative changes are seen throughout the slightly scoliotic thoracolumbar spine but no acute osseous abnormality. No evidence of osseous metastasis seen. Evidence of a previous surgical decompression of the central canal noted in the lower lumbar spine. IMPRESSION: 1. Increased number and size of the numerous splenic masses indicating neoplastic process, possibly lymphoma or metastasis as suggested on the previous exam. 2. Stable, or slightly smaller, left adrenal mass. This also remains suspicious for neoplastic process. 3. New enhancing lesion within the left hepatic lobe, segment 4B, measuring 2.8 x 1.5 cm. This is also suspicious for metastatic disease. 4. Mildly increased intrahepatic and extrahepatic bile duct dilatation, likely benign in nature and commensurate to the previous cholecystectomy. No bile duct stone seen. No obstructing mass seen. Could consider correlation with liver function tests. 5. Colonic diverticulosis without evidence of acute diverticulitis. 6. Fairly large amount of stool throughout the nondistended colon (constipation? ). No evidence of bowel obstruction or focal bowel wall inflammation. 7. Hiatal hernia, moderate to large in size, grossly stable compared to previous exams. 8. Other chronic/incidental findings detailed above. Electronically Signed   By: Franki Cabot M.D.   On: 01/31/2015 15:28    EKG:  Atrial flutter with 2-1 AV conduction, left anterior hemiblock, poor wave progression, ventricular rate 122 bpm. Flutter rate 240 bpm. ECG in 2013 demonstrated a similar conduction abnormality but sinus rhythm.    Sinclair Grooms 02/01/2015 3:11 PM

## 2015-02-01 NOTE — Progress Notes (Signed)
   02/01/15 1721  Vitals  BP 108/74 mmHg  MAP (mmHg) 82  BP Location Right Arm  BP Method Automatic  Patient Position (if appropriate) Sitting  Pulse Rate (!) 113  Pulse Rate Source Dinamap  Resp 18  Oxygen Therapy  SpO2 94 %  O2 Device Room Air   Vitals taken after amiodarone and heparin drip started as ordered. No s/s reaction; pt stable and asymptomatic. Will continue to closely monitor. Delia Heady RN

## 2015-02-01 NOTE — Progress Notes (Signed)
ANTICOAGULATION CONSULT NOTE - Initial Consult  Pharmacy Consult for Heparin Indication: atrial fibrillation  Allergies  Allergen Reactions  . Amoxicillin-Pot Clavulanate     REACTION: diarrhea  . Sulfonamide Derivatives     REACTION: unsure of reaction   Patient Measurements: Height: 4\' 10"  (147.3 cm) Weight: 123 lb (55.792 kg) IBW/kg (Calculated) : 40.9  Vital Signs: Temp: 97.7 F (36.5 C) (12/03 1403) Temp Source: Oral (12/03 1403) BP: 120/77 mmHg (12/03 1403) Pulse Rate: 126 (12/03 1403)  Labs:  Recent Labs  01/31/15 0947 01/31/15 1149 01/31/15 1946  HGB 12.4 12.7 11.3*  HCT 35.0* 39.5 35.8*  PLT  --  204 215  CREATININE  --  0.73 0.70   Estimated Creatinine Clearance: 38.1 mL/min (by C-G formula based on Cr of 0.7).  Medical History: Past Medical History  Diagnosis Date  . Hypertension   . Venous insufficiency   . Hypercholesteremia   . Hiatal hernia   . Dysphagia   . Diverticulosis of colon   . Irritable bowel syndrome   . Colitis     lymphocytic colitis feb 2011  . Urinary incontinence   . DJD (degenerative joint disease)   . Lumbar back pain   . Osteoporosis   . Anxiety   . Anemia   . Iron deficiency   . Vitamin B12 deficiency   . Allergic rhinitis    Medications:  Infusions:  . sodium chloride 75 mL/hr at 02/01/15 0840  . amiodarone     Followed by  . amiodarone      Assessment: 81 yo female with Aflutter.  We have been asked to initiate IV Heparin for thrombosis prevention.  She is not on anticoagulation at home and current labs reveal a stable hemoglobin 11.3/35.8.  Platelets are 215K and she is without noted bleeding complications.  Goal of Therapy:  Heparin level 0.3-0.7 units/ml Monitor platelets by anticoagulation protocol: Yes   Plan:  - Heparin 2000 units IV bolus x 1  - Heparin infusion at 800 units/hr - Obtain heparin level in 6 hours and then CBC/HL daily - Monitor for s/s of bleeding  Rober Minion, PharmD.,  MS Clinical Pharmacist Pager:  [phone] Thank you for allowing pharmacy to be part of this patients care team. 02/01/2015,4:15 PM

## 2015-02-02 ENCOUNTER — Observation Stay (HOSPITAL_BASED_OUTPATIENT_CLINIC_OR_DEPARTMENT_OTHER): Payer: Medicare Other

## 2015-02-02 ENCOUNTER — Observation Stay (HOSPITAL_COMMUNITY): Payer: Medicare Other

## 2015-02-02 DIAGNOSIS — Z792 Long term (current) use of antibiotics: Secondary | ICD-10-CM | POA: Diagnosis not present

## 2015-02-02 DIAGNOSIS — I484 Atypical atrial flutter: Secondary | ICD-10-CM | POA: Diagnosis not present

## 2015-02-02 DIAGNOSIS — I82409 Acute embolism and thrombosis of unspecified deep veins of unspecified lower extremity: Secondary | ICD-10-CM | POA: Diagnosis not present

## 2015-02-02 DIAGNOSIS — I5031 Acute diastolic (congestive) heart failure: Secondary | ICD-10-CM

## 2015-02-02 DIAGNOSIS — I5021 Acute systolic (congestive) heart failure: Secondary | ICD-10-CM | POA: Diagnosis present

## 2015-02-02 DIAGNOSIS — R935 Abnormal findings on diagnostic imaging of other abdominal regions, including retroperitoneum: Secondary | ICD-10-CM | POA: Diagnosis not present

## 2015-02-02 DIAGNOSIS — M810 Age-related osteoporosis without current pathological fracture: Secondary | ICD-10-CM | POA: Diagnosis present

## 2015-02-02 DIAGNOSIS — Z87891 Personal history of nicotine dependence: Secondary | ICD-10-CM | POA: Diagnosis not present

## 2015-02-02 DIAGNOSIS — I10 Essential (primary) hypertension: Secondary | ICD-10-CM | POA: Diagnosis present

## 2015-02-02 DIAGNOSIS — R531 Weakness: Secondary | ICD-10-CM | POA: Diagnosis present

## 2015-02-02 DIAGNOSIS — Z96659 Presence of unspecified artificial knee joint: Secondary | ICD-10-CM | POA: Diagnosis present

## 2015-02-02 DIAGNOSIS — K5909 Other constipation: Secondary | ICD-10-CM | POA: Diagnosis present

## 2015-02-02 DIAGNOSIS — Z66 Do not resuscitate: Secondary | ICD-10-CM | POA: Diagnosis present

## 2015-02-02 DIAGNOSIS — I483 Typical atrial flutter: Secondary | ICD-10-CM | POA: Diagnosis not present

## 2015-02-02 DIAGNOSIS — C787 Secondary malignant neoplasm of liver and intrahepatic bile duct: Secondary | ICD-10-CM | POA: Diagnosis present

## 2015-02-02 DIAGNOSIS — I4891 Unspecified atrial fibrillation: Secondary | ICD-10-CM

## 2015-02-02 DIAGNOSIS — M1990 Unspecified osteoarthritis, unspecified site: Secondary | ICD-10-CM | POA: Diagnosis present

## 2015-02-02 DIAGNOSIS — Z7982 Long term (current) use of aspirin: Secondary | ICD-10-CM | POA: Diagnosis not present

## 2015-02-02 DIAGNOSIS — I2510 Atherosclerotic heart disease of native coronary artery without angina pectoris: Secondary | ICD-10-CM | POA: Diagnosis present

## 2015-02-02 DIAGNOSIS — K7689 Other specified diseases of liver: Secondary | ICD-10-CM | POA: Diagnosis not present

## 2015-02-02 DIAGNOSIS — I471 Supraventricular tachycardia: Secondary | ICD-10-CM | POA: Diagnosis present

## 2015-02-02 DIAGNOSIS — E876 Hypokalemia: Secondary | ICD-10-CM | POA: Diagnosis not present

## 2015-02-02 DIAGNOSIS — K769 Liver disease, unspecified: Secondary | ICD-10-CM | POA: Diagnosis present

## 2015-02-02 DIAGNOSIS — E538 Deficiency of other specified B group vitamins: Secondary | ICD-10-CM | POA: Diagnosis present

## 2015-02-02 DIAGNOSIS — C801 Malignant (primary) neoplasm, unspecified: Secondary | ICD-10-CM | POA: Diagnosis present

## 2015-02-02 DIAGNOSIS — I4892 Unspecified atrial flutter: Secondary | ICD-10-CM | POA: Diagnosis present

## 2015-02-02 DIAGNOSIS — C7889 Secondary malignant neoplasm of other digestive organs: Secondary | ICD-10-CM | POA: Diagnosis present

## 2015-02-02 DIAGNOSIS — I4581 Long QT syndrome: Secondary | ICD-10-CM | POA: Diagnosis present

## 2015-02-02 DIAGNOSIS — K589 Irritable bowel syndrome without diarrhea: Secondary | ICD-10-CM | POA: Diagnosis present

## 2015-02-02 DIAGNOSIS — E7800 Pure hypercholesterolemia, unspecified: Secondary | ICD-10-CM | POA: Diagnosis present

## 2015-02-02 LAB — HEPARIN LEVEL (UNFRACTIONATED)
HEPARIN UNFRACTIONATED: 0.24 [IU]/mL — AB (ref 0.30–0.70)
HEPARIN UNFRACTIONATED: 0.32 [IU]/mL (ref 0.30–0.70)
Heparin Unfractionated: 0.32 IU/mL (ref 0.30–0.70)

## 2015-02-02 NOTE — Progress Notes (Signed)
*  Preliminary Results* Bilateral lower extremity venous duplex completed. Bilateral lower extremities are negative for deep vein thrombosis. There is no evidence of Baker's cyst bilaterally.  02/02/2015  Maudry Mayhew, RVT, RDCS, RDMS

## 2015-02-02 NOTE — Progress Notes (Signed)
[facility] PROGRESS NOTE  Cathy [name] [id number] DOB: 11/19/1927 DOA: 01/31/2015 PCP: Gwendolyn Grant, MD  Assessment/Plan: 1. Atrial flutter - EKG shows atrial flutter with 2:1 block , cardiology has seen the patient and started on IV amiodarone, heparin infusion, plan for TEE cardioversion in a.m. Patient has been started on metoprolol 12.5 mg by mouth twice a day. 2. ? Liver and spleen metastasis - CT scan of the abdomen showed multiple masses in the spleen which are now larger than previous CT scan. Also shows a mass in the left lobe of the liver.  All the tumor markers obtained in the hospital are negative. I called and discussed with oncology  Dr Lindi Adie, who will see the patient in his clinic next week to discuss further testing. Patient has had these lesions seen on the CT scan of the abdomen since 2010. 3. Elevated d-dimer - patient has relatively d-dimer, CT angiogram was negative for pulmonary embolism and lower extremity venous duplex is currently pending . Patient has been complaining of intermittent swelling of the left thigh for quite some time.  4.  DVT prophylaxis-patient on heparin  Code Status:  DO NOT RESUSCITATE  Family Communication: *spoke to patient's daughter at bedside  Disposition pending improvement in patient's heart rate   Consultants:  Cardiology   Procedures:   none    Antibiotics       none     HPI/Subjective: 81 y.o. female who was recently treated for a UTI. Patient return to urgent care today for follow-up. Told urinalysis was normal but patient found to be tachycardic. She was advised to come to the ED. Patient denies chest pains or shortness of breath. No dizziness Patient is physically active and lives independently. Per daughter at bedside, patient cleans the house on a daily basis. Patient states her appetite at home has been fine, no unexpected weight loss. No fevers or chills. She has chronic constipation.   Patient denies any  complaints. Has been seen by cardiology.  Objective: Filed Vitals:   02/02/15 0424 02/02/15 0842  BP: 128/92 137/91  Pulse: 113 115  Temp: 97.7 F (36.5 C)   Resp: 18 18    Intake/Output Summary (Last 24 hours) at 02/02/15 1103 Last data filed at 02/02/15 0700  Gross per 24 hour  Intake 1242.76 ml  Output    500 ml  Net 742.76 ml   Filed Weights   01/31/15 1117  Weight: 55.792 kg (123 lb)    Exam:   General: appears in no acute distress   Cardiovascular: S1-S2 regular   Respiratory:  clear to auscultation bilaterally   Abdomen: Soft, nontender, no organomegaly   Musculoskeletal:  no cyanosis/clubbing/edema of lower extremities    Data Reviewed: Basic Metabolic Panel:  Recent Labs Lab 01/31/15 1149 01/31/15 1946  NA 142  --   K 4.5  --   CL 107  --   CO2 30  --   GLUCOSE 92  --   BUN 10  --   CREATININE 0.73 0.70  CALCIUM 9.6  --   MG  --  2.0   Liver Function Tests:  Recent Labs Lab 01/31/15 1149  AST 24  ALT 20  ALKPHOS 58  BILITOT 0.5  PROT 6.4*  ALBUMIN 3.5   No results for input(s): LIPASE, AMYLASE in the last 168 hours. No results for input(s): AMMONIA in the last 168 hours. CBC:  Recent Labs Lab 01/31/15 0947 01/31/15 1149 01/31/15 1946  WBC 10.4* 9.8 8.3  NEUTROABS  --  8.2*  --   HGB 12.4 12.7 11.3*  HCT 35.0* 39.5 35.8*  MCV 89.4 95.6 96.0  PLT  --  204 215    Recent Results (from the past 240 hour(s))  Urine culture     Status: None   Collection Time: 01/31/15  9:42 AM  Result Value Ref Range Status   Colony Count NO GROWTH  Final   Organism ID, Bacteria NO GROWTH  Final  Urine culture     Status: None (Preliminary result)   Collection Time: 01/31/15 11:25 AM  Result Value Ref Range Status   Specimen Description URINE, RANDOM  Final   Special Requests NONE  Final   Culture CULTURE REINCUBATED FOR BETTER GROWTH  Final   Report Status PENDING  Incomplete     Studies: Ct Angio Chest Pe W/cm &/or Wo  Cm  02/01/2015  CLINICAL DATA:  Tachycardia. EXAM: CT ANGIOGRAPHY CHEST WITH CONTRAST TECHNIQUE: Multidetector CT imaging of the chest was performed using the standard protocol during bolus administration of intravenous contrast. Multiplanar CT image reconstructions and MIPs were obtained to evaluate the vascular anatomy. CONTRAST:  91mL OMNIPAQUE IOHEXOL 350 MG/ML SOLN COMPARISON:  None FINDINGS: THORACIC INLET/BODY WALL: No acute abnormality. MEDIASTINUM: Cardiomegaly with particularly notable right atrial enlargement. There is mitral annular ossification. Atherosclerosis, including the coronary arteries. Limited opacification of the aorta without evidence of acute aortic disease. No pericardial effusion. Large main pulmonary arteries, with the right branch measuring 28 mm. This could reflect pulmonary hypertension. No evidence of pulmonary embolism. Large hiatal hernia. LUNG WINDOWS: Small pleural effusions with basilar atelectasis. No indication of pneumonia or edema. UPPER ABDOMEN: No acute findings. Low-density 3 cm mass in the left adrenal gland is stable from 2010 myelogram and consistent with adenoma. OSSEOUS: Severe right glenohumeral arthropathy with joint effusion and degenerative cyst in the subscapular fossa. Severe and diffuse thoracic disc degeneration. No acute osseous finding. Review of the MIP images confirms the above findings. IMPRESSION: 1. Negative for pulmonary embolism. 2. Small pleural effusions and bibasilar atelectasis. 3. Large hiatal hernia. Electronically Signed   By: Monte Fantasia M.D.   On: 02/01/2015 11:31   Ct Abdomen Pelvis W Contrast  01/31/2015  CLINICAL DATA:  Tachycardia, lower abdominal pain. Recently treated for urinary tract infection. History of colitis, diverticulosis and hiatal hernia. EXAM: CT ABDOMEN AND PELVIS WITH CONTRAST TECHNIQUE: Multidetector CT imaging of the abdomen and pelvis was performed using the standard protocol following bolus administration of  intravenous contrast. CONTRAST:  576mL OMNIPAQUE IOHEXOL 300 MG/ML  SOLN COMPARISON:  Previous exams including CT abdomen and pelvis dated 04/19/2009 and 08/29/2008. FINDINGS: Lower chest: There is mild bibasilar scarring/atelectasis and small bilateral pleural effusions. Moderate to large hiatal hernia is unchanged. Hepatobiliary: There is slightly increased intrahepatic and extrahepatic bile duct dilatation, common bile duct now dilated to approximately 14 mm (previously measured at 12.5 mm). No obstructing bile duct stone or bile duct mass identified. There is a new enhancing lesion within the inferior left hepatic lobe, likely segment 4B, measuring 2.7 x 1.5 cm, worrisome for metastasis. No other enhancing mass or lesion identified within the liver. Patient is status post cholecystectomy. Pancreas: No mass, inflammatory changes, or other significant abnormality. No significant pancreatic duct dilatation. Spleen: There are numerous hypodense masses within the spleen, increased significantly in number compared to previous studies, and largest lesions have increased in size. Largest lesion within the posterior aspect of the spleen measures 2.8 x 2.7 cm (2.1 cm greatest  dimension on the previous exam, and approximately 1.5 cm on the earlier exam of 08/29/2008). Next largest lesion within the anterior spleen measures 2.7 x 2.3 cm (measured at 2.4 cm greatest dimension on the previous exam). The increase in size and number suggests neoplastic process. Adrenals/Urinary Tract: Left adrenal mass is slightly decreased in prominence measuring 3.2 x 2.1 cm on today's exam (previously measured at 3.4 x 2.5 cm). Right adrenal gland is normal. Both kidneys are unremarkable without stone or hydronephrosis. No ureteral or bladder calculi identified. Bladder is unremarkable. Stomach/Bowel: Bowel is normal in caliber. Scattered diverticulosis noted within the descending and sigmoid colon without evidence of acute diverticulitis.  Bowel is difficult to definitively characterize without oral contrast but there is no obvious evidence of bowel wall thickening or bowel wall inflammation. Appendix is not seen but there are no inflammatory changes about the cecum to suggest acute appendicitis. Vascular/Lymphatic: Scattered atherosclerotic changes again noted along the walls of the ectatic but normal-caliber abdominal aorta. No acute vascular abnormality seen. No enlarged lymph nodes identified in the abdomen or pelvis. Reproductive: No mass or other significant abnormality. Other: No free fluid or abscess collections seen. No free intraperitoneal air. Heart is at least mildly enlarged, incompletely imaged at the upper portion of this exam. Musculoskeletal: Degenerative changes are seen throughout the slightly scoliotic thoracolumbar spine but no acute osseous abnormality. No evidence of osseous metastasis seen. Evidence of a previous surgical decompression of the central canal noted in the lower lumbar spine. IMPRESSION: 1. Increased number and size of the numerous splenic masses indicating neoplastic process, possibly lymphoma or metastasis as suggested on the previous exam. 2. Stable, or slightly smaller, left adrenal mass. This also remains suspicious for neoplastic process. 3. New enhancing lesion within the left hepatic lobe, segment 4B, measuring 2.8 x 1.5 cm. This is also suspicious for metastatic disease. 4. Mildly increased intrahepatic and extrahepatic bile duct dilatation, likely benign in nature and commensurate to the previous cholecystectomy. No bile duct stone seen. No obstructing mass seen. Could consider correlation with liver function tests. 5. Colonic diverticulosis without evidence of acute diverticulitis. 6. Fairly large amount of stool throughout the nondistended colon (constipation? ). No evidence of bowel obstruction or focal bowel wall inflammation. 7. Hiatal hernia, moderate to large in size, grossly stable compared to  previous exams. 8. Other chronic/incidental findings detailed above. Electronically Signed   By: Franki Cabot M.D.   On: 01/31/2015 15:28    Scheduled Meds: . aspirin EC  81 mg Oral Daily  . metoprolol tartrate  12.5 mg Oral BID  . pantoprazole  40 mg Oral Q1200  . polyethylene glycol  17 g Oral BID  . pregabalin  50 mg Oral TID  . simvastatin  40 mg Oral q1800  . sodium chloride  1,000 mL Intravenous Once  . sodium chloride  3 mL Intravenous Q12H   Continuous Infusions: . sodium chloride 75 mL/hr at 02/01/15 0840  . amiodarone 30 mg/hr (02/02/15 0521)  . heparin 900 Units/hr (02/02/15 0233)    Active Problems:   Constipation   Tachycardia   Fatigue   Abnormal CT of the abdomen   Prolonged Q-T interval on ECG   Sinus tachycardia (HCC)   Atrial flutter (HCC)   Acute diastolic heart failure ([place])    Time spent: 25 min    [facility] Pager [phone]. If 7PM-7AM, please contact night-coverage at [email or web address], password [facility] 02/02/2015, 11:03 AM

## 2015-02-02 NOTE — Progress Notes (Signed)
PHARMACY NOTE - Follow Up Consult  Pharmacy Consult  :  Heparin Indication  :  Newly discovered Atrial Flutter   Heparin Dosing Weight: 55.8 kg   Recent Labs  01/31/15 0947 01/31/15 1149 01/31/15 1946 02/02/15 0102 02/02/15 1039  HGB 12.4 12.7 11.3*  --   --   HCT 35.0* 39.5 35.8*  --   --   PLT  --  204 215  --   --   HEPARINUNFRC  --   --   --  0.24* 0.32  CREATININE  --  0.73 0.70  --   --    Medications: Scheduled:  . aspirin EC  81 mg Oral Daily  . metoprolol tartrate  12.5 mg Oral BID  . pantoprazole  40 mg Oral Q1200  . polyethylene glycol  17 g Oral BID  . pregabalin  50 mg Oral TID  . simvastatin  40 mg Oral q1800  . sodium chloride  1,000 mL Intravenous Once  . sodium chloride  3 mL Intravenous Q12H   Infusions:  . sodium chloride 75 mL/hr at 02/01/15 0840  . amiodarone 30 mg/hr (02/02/15 0521)  . heparin 900 Units/hr (02/02/15 [id number])   Assessment:  81 y/o female with newly discovered Atrial Flutter continues on Heparin infusion.  Heparin rate 900 units/hr, Heparin level at low end of therapeutic range.  No evidence of bleeding complications noted   Goal:  Heparin level 0.3-0.7 units/ml   Plan: 1. Increase Heparin infusion to 1050 units/hr. 2. Next Heparin level in 8 hours. 3. Continue Daily Heparin Levels, Platelet counts, CBC.  Monitor for bleeding complications    Estelle June,  Pharm.D  02/02/2015, 11:36 AM

## 2015-02-02 NOTE — Progress Notes (Addendum)
       Patient Name: Cathy [name] Date of Encounter: 02/02/2015    SUBJECTIVE: Newly discovered atrial flutter with tachycardia. Clinical symptom is exertional fatigue.  TELEMETRY:  Atrial flutter with 2-1 AV conduction and heart rate in the 115 bpm range. Filed Vitals:   02/01/15 1949 02/01/15 2115 02/02/15 0424 02/02/15 0842  BP: 97/59 103/63 128/92 137/91  Pulse: 116 116 113 115  Temp: 98.3 F (36.8 C)  97.7 F (36.5 C)   TempSrc: Oral  Oral   Resp: 16  18 18   Height:      Weight:      SpO2: 95%  97% 97%    Intake/Output Summary (Last 24 hours) at 02/02/15 1042 Last data filed at 02/02/15 0700  Gross per 24 hour  Intake 1242.76 ml  Output    500 ml  Net 742.76 ml   LABS: Basic Metabolic Panel:  Recent Labs  01/31/15 1149 01/31/15 1946  NA 142  --   K 4.5  --   CL 107  --   CO2 30  --   GLUCOSE 92  --   BUN 10  --   CREATININE 0.73 0.70  CALCIUM 9.6  --   MG  --  2.0   CBC:  Recent Labs  01/31/15 1149 01/31/15 1946  WBC 9.8 8.3  NEUTROABS 8.2*  --   HGB 12.7 11.3*  HCT 39.5 35.8*  MCV 95.6 96.0  PLT 204 215   BNP    Component Value Date/Time   BNP 421.8* 02/01/2015 1618    ProBNP    Component Value Date/Time   PROBNP 93.0 09/04/2008 0450      Radiology/Studies:  Chest CT negative for evidence of pulmonary embolism, right atrial enlargement, and three-vessel coronary calcification  Physical Exam: Blood pressure 137/91, pulse 115, temperature 97.7 F (36.5 C), temperature source Oral, resp. rate 18, height 4\' 10"  (1.473 m), weight 123 lb (55.792 kg), SpO2 97 %. Weight change:   Wt Readings from Last 3 Encounters:  01/31/15 123 lb (55.792 kg)  01/31/15 123 lb (55.792 kg)  01/16/15 122 lb 12.8 oz (55.702 kg)    Lying flat. No respiratory distress. Lungs are clear Rapid heart rate but no murmur or gallop Extremities reveal no significant edema  ASSESSMENT:  1. Atrial flutter with 2:1 AV conduction and ventricular rate of 115  bpm 2. Possible metastatic abdominal process 3. Asymptomatic coronary artery disease 4. Presumed acute on chronic diastolic heart failure secondary to unrecognized atrial flutter. Rule out systolic dysfunction.  Plan:  1. Continue IV amiodarone 2. Awaiting 2-D Doppler echocardiogram 3. Continue IV heparin 4. Check TSH and sedimentation rate 5. Nothing by mouth after midnight for possible TEE cardioversion tomorrow. Procedure and risk briefly discussed with the patient. Family not available. We will re--discuss in a.m.   Demetrios Isaacs 02/02/2015, 10:42 AM

## 2015-02-02 NOTE — Progress Notes (Signed)
ANTICOAGULATION CONSULT NOTE - Follow Up Consult  Pharmacy Consult for heparin Indication: atrial fibrillation   Labs:  Recent Labs  01/31/15 0947 01/31/15 1149 01/31/15 1946 02/02/15 0102  HGB 12.4 12.7 11.3*  --   HCT 35.0* 39.5 35.8*  --   PLT  --  204 215  --   HEPARINUNFRC  --   --   --  0.24*  CREATININE  --  0.73 0.70  --      Assessment: 81yo female subtherapeutic on heparin with initial dosing for Afib.  Goal of Therapy:  Heparin level 0.3-0.7 units/ml   Plan:  Will increase heparin gtt by 2 units/kg/hr to 900 units/hr and check level in [place], PharmD, BCPS  02/02/2015,1:58 AM

## 2015-02-02 NOTE — Progress Notes (Signed)
*  PRELIMINARY RESULTS* Echocardiogram 2D Echocardiogram has been performed.  Cathy [name] 02/02/2015, 11:43 AM

## 2015-02-02 NOTE — Progress Notes (Signed)
PHARMACY NOTE - Follow Up Consult  Pharmacy Consult  :  Heparin Indication  :  Newly discovered Atrial Flutter   Heparin Dosing Weight: 55.8 kg   Recent Labs  01/31/15 0947 01/31/15 1149 01/31/15 1946 02/02/15 0102 02/02/15 1039 02/02/15 2020  HGB 12.4 12.7 11.3*  --   --   --   HCT 35.0* 39.5 35.8*  --   --   --   PLT  --  204 215  --   --   --   HEPARINUNFRC  --   --   --  0.24* 0.32 0.32  CREATININE  --  0.73 0.70  --   --   --    Medications: Scheduled:  . aspirin EC  81 mg Oral Daily  . metoprolol tartrate  12.5 mg Oral BID  . pantoprazole  40 mg Oral Q1200  . polyethylene glycol  17 g Oral BID  . pregabalin  50 mg Oral TID  . simvastatin  40 mg Oral q1800  . sodium chloride  1,000 mL Intravenous Once  . sodium chloride  3 mL Intravenous Q12H    Infusions:  . sodium chloride 75 mL/hr at 02/02/15 1151  . amiodarone 30 mg/hr (02/02/15 1740)  . heparin 1,050 Units/hr (02/02/15 1149)    Assessment:  81 y/o female with newly discovered Atrial Flutter continues on Heparin infusion.  Heparin rate 900 units/hr, Heparin level at low end of therapeutic range. No evidence of bleeding complications noted  Goal:  Heparin level 0.3-0.7 units/ml   Plan: 1. Increase Heparin to 1150 units/hr. 2. Daily Heparin Levels, Platelet counts, CBC.  Monitor for bleeding complications    Estelle June,  Pharm.D  02/02/2015, 8:58 PM

## 2015-02-03 ENCOUNTER — Inpatient Hospital Stay (HOSPITAL_COMMUNITY): Payer: Medicare Other | Admitting: Anesthesiology

## 2015-02-03 ENCOUNTER — Encounter (HOSPITAL_COMMUNITY): Payer: Self-pay | Admitting: *Deleted

## 2015-02-03 ENCOUNTER — Inpatient Hospital Stay (HOSPITAL_COMMUNITY): Payer: Medicare Other

## 2015-02-03 ENCOUNTER — Encounter (HOSPITAL_COMMUNITY)
Admission: EM | Disposition: A | Discharge status: Home Health | Payer: Self-pay | Source: Home / Self Care | Attending: Family Medicine

## 2015-02-03 DIAGNOSIS — I2510 Atherosclerotic heart disease of native coronary artery without angina pectoris: Secondary | ICD-10-CM

## 2015-02-03 DIAGNOSIS — I5021 Acute systolic (congestive) heart failure: Secondary | ICD-10-CM

## 2015-02-03 HISTORY — PX: CARDIOVERSION: SHX1299

## 2015-02-03 HISTORY — PX: TEE WITHOUT CARDIOVERSION: SHX5443

## 2015-02-03 LAB — BASIC METABOLIC PANEL
ANION GAP: 6 (ref 5–15)
BUN: 5 mg/dL — ABNORMAL LOW (ref 6–20)
CHLORIDE: 112 mmol/L — AB (ref 101–111)
CO2: 23 mmol/L (ref 22–32)
Calcium: 8.3 mg/dL — ABNORMAL LOW (ref 8.9–10.3)
Creatinine, Ser: 0.68 mg/dL (ref 0.44–1.00)
GFR calc Af Amer: >60 mL/min (ref >60)
GLUCOSE: 95 mg/dL (ref 65–99)
POTASSIUM: 4 mmol/L (ref 3.5–5.1)
Sodium: 141 mmol/L (ref 135–145)

## 2015-02-03 LAB — CBC
HEMATOCRIT: 37.3 % (ref 36.0–46.0)
HEMOGLOBIN: 11.9 g/dL — AB (ref 12.0–15.0)
MCH: 30.2 pg (ref 26.0–34.0)
MCHC: 31.9 g/dL (ref 30.0–36.0)
MCV: 94.7 fL (ref 78.0–100.0)
PLATELETS: 194 10*3/uL (ref 150–400)
RBC: 3.94 MIL/uL (ref 3.87–5.11)
RDW: 13.8 % (ref 11.5–15.5)
WBC: 8.2 10*3/uL (ref 4.0–10.5)

## 2015-02-03 LAB — URINE CULTURE

## 2015-02-03 LAB — HEPARIN LEVEL (UNFRACTIONATED)
Heparin Unfractionated: 0.58 IU/mL (ref 0.30–0.70)
Heparin Unfractionated: 0.5 IU/mL (ref 0.30–0.70)

## 2015-02-03 SURGERY — ECHOCARDIOGRAM, TRANSESOPHAGEAL
Anesthesia: Monitor Anesthesia Care

## 2015-02-03 MED ORDER — PROPOFOL 500 MG/50ML IV EMUL: INTRAVENOUS | Status: DC | PRN

## 2015-02-03 MED ORDER — SPIRONOLACTONE 25 MG PO TABS
12.5000 mg | ORAL_TABLET | Freq: Two times a day (BID) | ORAL | Status: DC
  Administered 2015-02-03 – 2015-02-04 (×4): 12.5 mg via ORAL
  Filled 2015-02-03 (×4): qty 1

## 2015-02-03 MED ORDER — SODIUM CHLORIDE 0.9 % IV SOLN
INTRAVENOUS | Status: DC
  Administered 2015-02-03: 08:00:00 via INTRAVENOUS

## 2015-02-03 MED ORDER — PROPOFOL 500 MG/50ML IV EMUL
INTRAVENOUS | Status: DC | PRN
  Administered 2015-02-03: 50 ug/kg/min via INTRAVENOUS

## 2015-02-03 MED ORDER — METOPROLOL TARTRATE 12.5 MG HALF TABLET
12.5000 mg | ORAL_TABLET | Freq: Two times a day (BID) | ORAL | Status: DC
  Administered 2015-02-04: 12.5 mg via ORAL
  Filled 2015-02-03: qty 1

## 2015-02-03 MED ORDER — BUTAMBEN-TETRACAINE-BENZOCAINE 2-2-14 % EX AERO
INHALATION_SPRAY | CUTANEOUS | Status: DC | PRN
  Administered 2015-02-03: 2 via TOPICAL

## 2015-02-03 MED ORDER — PROMETHAZINE HCL 25 MG/ML IJ SOLN: 6.2500 mg | INTRAMUSCULAR | Status: DC | PRN

## 2015-02-03 NOTE — Progress Notes (Signed)
PT Cancellation Note  Patient Details Name: Cathy [name] MRN: [id number] DOB: 30-Oct-1927   Cancelled Treatment:    Reason Eval/Treat Not Completed: Other (comment) Pt going down for cardioversion around 11 am. Will follow up next available time as appropriate.   Marguarite Arbour A Lania Zawistowski 02/03/2015, 9:58 AM Wray Kearns, PT, DPT [phone]

## 2015-02-03 NOTE — Progress Notes (Signed)
  Echocardiogram Echocardiogram Transesophageal has been performed.  Cathy [name] 02/03/2015, 2:56 PM

## 2015-02-03 NOTE — Progress Notes (Addendum)
Patient Name: Cathy [name] Date of Encounter: 02/03/2015    SUBJECTIVE: Quiet night. No chest pain or increased dyspnea.  TELEMETRY:  Atrial flutter with VR 110 bpm.  Filed Vitals:   02/02/15 0842 02/02/15 1425 02/02/15 1942 02/03/15 0428  BP: 137/91 111/66 106/73 127/81  Pulse: 115 112 110 108  Temp:  97.8 F (36.6 C) 98 F (36.7 C) 98.2 F (36.8 C)  TempSrc:  Oral Oral Oral  Resp: 18 17 18 18   Height:      Weight:      SpO2: 97% 97% 96% 96%    Intake/Output Summary (Last 24 hours) at 02/03/15 0911 Last data filed at 02/03/15 0543  Gross per 24 hour  Intake 2373.06 ml  Output    275 ml  Net 2098.06 ml   LABS: Basic Metabolic Panel:  Recent Labs  01/31/15 1149 01/31/15 1946  NA 142  --   K 4.5  --   CL 107  --   CO2 30  --   GLUCOSE 92  --   BUN 10  --   CREATININE 0.73 0.70  CALCIUM 9.6  --   MG  --  2.0   CBC:  Recent Labs  01/31/15 1149 01/31/15 1946  WBC 9.8 8.3  NEUTROABS 8.2*  --   HGB 12.7 11.3*  HCT 39.5 35.8*  MCV 95.6 96.0  PLT 204 215   Echocardiogram: Study Conclusions  - Left ventricle: The cavity size was normal. There was moderate concentric hypertrophy. Systolic function was severely reduced. The estimated ejection fraction was in the range of 25% to 30%. There is akinesis of the mid-apicalanteroseptal myocardium. Doppler parameters are consistent with a reversible restrictive pattern, indicative of decreased left ventricular diastolic compliance and/or increased left atrial pressure (grade 3 diastolic dysfunction). - Mitral valve: Moderately calcified annulus. There was moderate regurgitation. - Left atrium: The atrium was severely dilated. Volume/bsa, ES (1-plane Simpson&'s, A4C): 64.3 ml/m^2. - Right ventricle: Systolic function was mildly reduced. - Right atrium: The atrium was moderately dilated. - Tricuspid valve: There was moderate regurgitation. - Pulmonary arteries: Systolic pressure  was mildly increased. PA peak pressure: 33 mm Hg (S).  Radiology/Studies:  No new data  Physical Exam: Blood pressure 127/81, pulse 108, temperature 98.2 F (36.8 C), temperature source Oral, resp. rate 18, height 4\' 10"  (1.473 m), weight 123 lb (55.792 kg), SpO2 96 %. Weight change:   Wt Readings from Last 3 Encounters:  01/31/15 123 lb (55.792 kg)  01/31/15 123 lb (55.792 kg)  01/16/15 122 lb 12.8 oz (55.702 kg)     ASSESSMENT:  1. Atrial flutter with RVR of unknown duration This patients CHA2DS2-VASc Score and unadjusted Ischemic Stroke Rate (% per year) is equal to 3.2 % stroke rate/year from a score of 3  Above score calculated as 1 point each if present [CHF, HTN, DM, Vascular=MI/PAD/Aortic Plaque, Age if 65-74, or Female] Above score calculated as 2 points each if present [Age > 75, or Stroke/TIA/TE]  2. Acute systolic heart failure, likely tachycardia related. EF 30 % with normal cavity size suggesting acute onset rather chronic. Will need to exclude component of ischemic CM when rhythm settled. 3. Three vessel CAD denoted by 3 vessel coronary calcification.  Plan:  1. TEE cardioversion today 2. Continue amiodarone and will switch to oral in AM 3. Will start oral apixaban after cardioversion, ? 2.5 mg BID and stop heparin 4. Nuclear perfusion imaging as OP to exclude ischemia. Will  not cath unless high risk. 5. Beta blocker and ACE therapy now that low EF documented, as tolerated by HR and BP/renal function 6. Decrease IV fluid rate 7. Low dose spironolactone (and maybe not use ACE, but we will see)  Signed, Sinclair Grooms 02/03/2015, 9:11 AM

## 2015-02-03 NOTE — Progress Notes (Signed)
[facility] PROGRESS NOTE  Cathy [name] [id number] DOB: 25-Mar-1927 DOA: 01/31/2015 PCP: Gwendolyn Grant, MD  Assessment/Plan: 1. Atrial flutter - EKG shows atrial flutter with 2:1 block , cardiology has seen the patient and started on IV amiodarone, heparin infusion, plan for TEE cardioversion today. Patient has been started on metoprolol 12.5 mg by mouth twice a day. Will be started on anticoagulation with Eliquis  after the cardioversion. Continue amiodarone. 2. ? Liver and spleen metastasis - CT scan of the abdomen showed multiple masses in the spleen which are now larger than previous CT scan. Also shows a mass in the left lobe of the liver.  All the tumor markers obtained in the hospital are negative. I called and discussed with oncology  Dr Lindi Adie, who will see the patient in his clinic next week to discuss further testing. Patient has had these lesions seen on the CT scan of the abdomen since 2010. 3. Elevated d-dimer - patient has elevated d-dimer, CT angiogram was negative for pulmonary embolism and lower extremity venous duplex was negative for DVT. Patient has been complaining of intermittent swelling of the left thigh for quite some time.  4.  DVT prophylaxis-patient on heparin  Code Status:  DO NOT RESUSCITATE  Family Communication: *spoke to patient's daughter at bedside on 02/01/2015 Disposition pending improvement in patient's heart rate   Consultants:  Cardiology   Procedures:   none    Antibiotics       none     HPI/Subjective: 81 y.o. female who was recently treated for a UTI. Patient return to urgent care today for follow-up. Told urinalysis was normal but patient found to be tachycardic. She was advised to come to the ED. Patient denies chest pains or shortness of breath. No dizziness Patient is physically active and lives independently. Per daughter at bedside, patient cleans the house on a daily basis. Patient states her appetite at home has been fine,  no unexpected weight loss. No fevers or chills. She has chronic constipation.   Patient seen and examined, no complaints. Awaiting TEE cardioversion today  Objective: Filed Vitals:   02/02/15 1942 02/03/15 0428  BP: 106/73 127/81  Pulse: 110 108  Temp: 98 F (36.7 C) 98.2 F (36.8 C)  Resp: 18 18    Intake/Output Summary (Last 24 hours) at 02/03/15 1115 Last data filed at 02/03/15 0900  Gross per 24 hour  Intake 2373.06 ml  Output    525 ml  Net 1848.06 ml   Filed Weights   01/31/15 1117  Weight: 55.792 kg (123 lb)    Exam:   General: appears in no acute distress   Cardiovascular: S1-S2 regular   Respiratory:  clear to auscultation bilaterally   Abdomen: Soft, nontender, no organomegaly   Musculoskeletal:  no cyanosis/clubbing/edema of lower extremities    Data Reviewed: Basic Metabolic Panel:  Recent Labs Lab 01/31/15 1149 01/31/15 1946  NA 142  --   K 4.5  --   CL 107  --   CO2 30  --   GLUCOSE 92  --   BUN 10  --   CREATININE 0.73 0.70  CALCIUM 9.6  --   MG  --  2.0   Liver Function Tests:  Recent Labs Lab 01/31/15 1149  AST 24  ALT 20  ALKPHOS 58  BILITOT 0.5  PROT 6.4*  ALBUMIN 3.5   No results for input(s): LIPASE, AMYLASE in the last 168 hours. No results for input(s): AMMONIA in the last 168  hours. CBC:  Recent Labs Lab 01/31/15 0947 01/31/15 1149 01/31/15 1946 02/03/15 1030  WBC 10.4* 9.8 8.3 8.2  NEUTROABS  --  8.2*  --   --   HGB 12.4 12.7 11.3* 11.9*  HCT 35.0* 39.5 35.8* 37.3  MCV 89.4 95.6 96.0 94.7  PLT  --  204 215 194    Recent Results (from the past 240 hour(s))  Urine culture     Status: None   Collection Time: 01/31/15  9:42 AM  Result Value Ref Range Status   Colony Count NO GROWTH  Final   Organism ID, Bacteria NO GROWTH  Final  Urine culture     Status: None   Collection Time: 01/31/15 11:25 AM  Result Value Ref Range Status   Specimen Description URINE, RANDOM  Final   Special Requests NONE  Final    Culture 30,000 COLONIES/mL VIRIDANS STREPTOCOCCUS  Final   Report Status 02/03/2015 FINAL  Final     Studies: Ct Angio Chest Pe W/cm &/or Wo Cm  02/01/2015  CLINICAL DATA:  Tachycardia. EXAM: CT ANGIOGRAPHY CHEST WITH CONTRAST TECHNIQUE: Multidetector CT imaging of the chest was performed using the standard protocol during bolus administration of intravenous contrast. Multiplanar CT image reconstructions and MIPs were obtained to evaluate the vascular anatomy. CONTRAST:  61mL OMNIPAQUE IOHEXOL 350 MG/ML SOLN COMPARISON:  None FINDINGS: THORACIC INLET/BODY WALL: No acute abnormality. MEDIASTINUM: Cardiomegaly with particularly notable right atrial enlargement. There is mitral annular ossification. Atherosclerosis, including the coronary arteries. Limited opacification of the aorta without evidence of acute aortic disease. No pericardial effusion. Large main pulmonary arteries, with the right branch measuring 28 mm. This could reflect pulmonary hypertension. No evidence of pulmonary embolism. Large hiatal hernia. LUNG WINDOWS: Small pleural effusions with basilar atelectasis. No indication of pneumonia or edema. UPPER ABDOMEN: No acute findings. Low-density 3 cm mass in the left adrenal gland is stable from 2010 myelogram and consistent with adenoma. OSSEOUS: Severe right glenohumeral arthropathy with joint effusion and degenerative cyst in the subscapular fossa. Severe and diffuse thoracic disc degeneration. No acute osseous finding. Review of the MIP images confirms the above findings. IMPRESSION: 1. Negative for pulmonary embolism. 2. Small pleural effusions and bibasilar atelectasis. 3. Large hiatal hernia. Electronically Signed   By: Monte Fantasia M.D.   On: 02/01/2015 11:31    Scheduled Meds: . aspirin EC  81 mg Oral Daily  . [START ON 02/04/2015] metoprolol tartrate  12.5 mg Oral BID  . pantoprazole  40 mg Oral Q1200  . polyethylene glycol  17 g Oral BID  . pregabalin  50 mg Oral TID  .  simvastatin  40 mg Oral q1800  . sodium chloride  1,000 mL Intravenous Once  . sodium chloride  3 mL Intravenous Q12H  . spironolactone  12.5 mg Oral BID   Continuous Infusions: . sodium chloride 10 mL/hr at 02/03/15 1011  . sodium chloride 20 mL/hr at 02/03/15 0812  . amiodarone 30 mg/hr (02/03/15 0543)  . heparin 1,150 Units/hr (02/02/15 2320)    Active Problems:   Constipation   Tachycardia   Fatigue   Abnormal CT of the abdomen   Prolonged Q-T interval on ECG   Sinus tachycardia (HCC)   Atrial flutter (HCC)   Acute systolic HF (heart failure) (HCC)   CAD (coronary artery disease), native coronary artery    Time spent: 25 min    [facility] Pager [phone]. If 7PM-7AM, please contact night-coverage at [email or web address], password [facility] 02/03/2015, 11:15  AM  LOS: 1 day

## 2015-02-03 NOTE — Transfer of Care (Signed)
Immediate Anesthesia Transfer of Care Note  Patient: Cathy [name]  Procedure(s) Performed: Procedure(s): TRANSESOPHAGEAL ECHOCARDIOGRAM (TEE) (N/A) CARDIOVERSION (N/A)  Patient Location: Endoscopy Unit  Anesthesia Type:MAC  Level of Consciousness: awake, alert  and oriented  Airway & Oxygen Therapy: Patient Spontanous Breathing and Patient connected to nasal cannula oxygen  Post-op Assessment: Report given to RN and Post -op Vital signs reviewed and stable  Post vital signs: Reviewed and stable  Last Vitals:  Filed Vitals:   02/03/15 0428 02/03/15 1300  BP: 127/81 152/103  Pulse: 108 110  Temp: 36.8 C 36.7 C  Resp: 18 18    Complications: No apparent anesthesia complications

## 2015-02-03 NOTE — Care Management Note (Signed)
Case Management Note  Patient Details  Name: Cathy [name] MRN: [id number] Date of Birth: 27-Jan-1928  Subjective/Objective:      Pt admitted with tachycardia              Action/Plan:  Pt is independent from home   Expected Discharge Date:  01/31/15               Expected Discharge Plan:  Home/Self Care (Pt is independent from home with strong family support close by.  Pt uses cane when out in the community)  In-House Referral:     Discharge planning Services  CM Consult  Post Acute Care Choice:    Choice offered to:     DME Arranged:    DME Agency:     HH Arranged:    HH Agency:     Status of Service:  In process, will continue to follow  Medicare Important Message Given:    Date Medicare IM Given:    Medicare IM give by:    Date Additional Medicare IM Given:    Additional Medicare Important Message give by:     If discussed at [place] of Stay Meetings, dates discussed:    Additional Comments: CM assessed pt.  Pt does not wish to participate in HHPT at this time, states she wants to wait and see how she does.  Pt is scheduled for TEE/Cardioversion today, CM will reassess post procedure. [name], RN 02/03/2015, 11:51 AM

## 2015-02-03 NOTE — Anesthesia Preprocedure Evaluation (Signed)
Anesthesia Evaluation  Patient identified by MRN, date of birth, ID band Patient awake    Reviewed: Allergy & Precautions, NPO status , Patient's Chart, lab work & pertinent test results  Airway Mallampati: II  TM Distance: >3 FB Neck ROM: Full    Dental no notable dental hx.    Pulmonary neg pulmonary ROS, former smoker,    Pulmonary exam normal breath sounds clear to auscultation       Cardiovascular hypertension, Pt. on medications and Pt. on home beta blockers Normal cardiovascular exam+ dysrhythmias Atrial Fibrillation  Rhythm:Regular Rate:Normal     Neuro/Psych negative neurological ROS  negative psych ROS   GI/Hepatic negative GI ROS, Neg liver ROS,   Endo/Other  negative endocrine ROS  Renal/GU negative Renal ROS  negative genitourinary   Musculoskeletal negative musculoskeletal ROS (+)   Abdominal   Peds negative pediatric ROS (+)  Hematology negative hematology ROS (+)   Anesthesia Other Findings   Reproductive/Obstetrics negative OB ROS                             Anesthesia Physical Anesthesia Plan  ASA: III  Anesthesia Plan: MAC   Post-op Pain Management:    Induction: Intravenous  Airway Management Planned: Mask  Additional Equipment:   Intra-op Plan:   Post-operative Plan:   Informed Consent: I have reviewed the patients History and Physical, chart, labs and discussed the procedure including the risks, benefits and alternatives for the proposed anesthesia with the patient or authorized representative who has indicated his/her understanding and acceptance.   Dental advisory given  Plan Discussed with: CRNA and Surgeon  Anesthesia Plan Comments:         Anesthesia Quick Evaluation

## 2015-02-03 NOTE — CV Procedure (Signed)
    PROCEDURE NOTE:  Procedure:  Transesophageal echocardiogram Operator:  Fransico Him, MD Indications:  Atrial flutter Complications: None IV Meds:Propofol gtt  Results: Poorly visualized TEE.  The patient's heart is significantly rotated and there was poor visualization of the heart despite multiple attempts and angulations of TEE probe.   LV and RV were not visualized.    The LA appendage was large and multilobed similar to cauliflower shape with emptying velocity reduced at 10.  There was some spontaneous echo contrast noted c/w sluggish flow.   The AV was trileaflet with trivial AI.  Normal PV with trivial PR  Poorly visualized MV leaflets but there was at least mild MR.   The LA was not visualized adequately to comment on LA thrombus.  At 0 degrees the LA appendage was the only visualized chamber and multiple attempts were made to visualized the LA in multiple views.  The RA was poorly visualized but portion viewed appeared enlarged.  The TV was not visualized.  DCCV was not performed due to inadequate visualization of the LA to rule out thrombus.  This was discussed with Dr. Tamala Julian.  The patient tolerated the procedure well and was transferred back to their room in stable condition.  Signed: Fransico Him, MD [facility]

## 2015-02-03 NOTE — Progress Notes (Signed)
ANTICOAGULATION CONSULT NOTE - Follow Up Consult  Pharmacy Consult for heparin Indication: atrial fibrillation   Labs:  Recent Labs  01/31/15 0947 01/31/15 1149 01/31/15 1946  02/02/15 1039 02/02/15 2020 02/03/15 0312  HGB 12.4 12.7 11.3*  --   --   --   --   HCT 35.0* 39.5 35.8*  --   --   --   --   PLT  --  204 215  --   --   --   --   HEPARINUNFRC  --   --   --   < > 0.32 0.32 0.58  CREATININE  --  0.73 0.70  --   --   --   --   < > = values in this interval not displayed.   Assessment/Plan:  81yo female therapeutic on heparin after rate change. Will continue gtt at current rate and confirm stable with additional level.   Wynona Neat, PharmD, BCPS  02/03/2015,4:49 AM

## 2015-02-04 ENCOUNTER — Encounter (HOSPITAL_COMMUNITY): Payer: Self-pay | Admitting: Cardiology

## 2015-02-04 DIAGNOSIS — I484 Atypical atrial flutter: Secondary | ICD-10-CM

## 2015-02-04 LAB — CBC
HCT: 31.9 % — ABNORMAL LOW (ref 36.0–46.0)
HEMOGLOBIN: 10.7 g/dL — AB (ref 12.0–15.0)
MCH: 31 pg (ref 26.0–34.0)
MCHC: 33.5 g/dL (ref 30.0–36.0)
MCV: 92.5 fL (ref 78.0–100.0)
Platelets: 159 10*3/uL (ref 150–400)
RBC: 3.45 MIL/uL — ABNORMAL LOW (ref 3.87–5.11)
RDW: 14 % (ref 11.5–15.5)
WBC: 11.4 10*3/uL — ABNORMAL HIGH (ref 4.0–10.5)

## 2015-02-04 LAB — BASIC METABOLIC PANEL
Anion gap: 8 (ref 5–15)
BUN: 5 mg/dL — ABNORMAL LOW (ref 6–20)
CALCIUM: 8.2 mg/dL — AB (ref 8.9–10.3)
CHLORIDE: 108 mmol/L (ref 101–111)
CO2: 24 mmol/L (ref 22–32)
CREATININE: 0.7 mg/dL (ref 0.44–1.00)
GFR calc non Af Amer: >60 mL/min (ref >60)
Glucose, Bld: 102 mg/dL — ABNORMAL HIGH (ref 65–99)
Potassium: 3.6 mmol/L (ref 3.5–5.1)
SODIUM: 140 mmol/L (ref 135–145)

## 2015-02-04 LAB — HEPARIN LEVEL (UNFRACTIONATED): HEPARIN UNFRACTIONATED: 0.56 [IU]/mL (ref 0.30–0.70)

## 2015-02-04 MED ORDER — SIMETHICONE 80 MG PO CHEW
80.0000 mg | CHEWABLE_TABLET | Freq: Four times a day (QID) | ORAL | Status: AC
  Administered 2015-02-04 – 2015-02-05 (×4): 80 mg via ORAL
  Filled 2015-02-04 (×4): qty 1

## 2015-02-04 MED ORDER — HYDROCODONE-ACETAMINOPHEN 5-325 MG PO TABS: 1.0000 | ORAL_TABLET | Freq: Four times a day (QID) | ORAL | Status: DC | PRN

## 2015-02-04 MED ORDER — APIXABAN 2.5 MG PO TABS
2.5000 mg | ORAL_TABLET | Freq: Two times a day (BID) | ORAL | Status: DC
  Administered 2015-02-04 – 2015-02-06 (×5): 2.5 mg via ORAL
  Filled 2015-02-04 (×5): qty 1

## 2015-02-04 MED ORDER — METOPROLOL TARTRATE 25 MG PO TABS
25.0000 mg | ORAL_TABLET | Freq: Two times a day (BID) | ORAL | Status: DC
  Administered 2015-02-04 – 2015-02-06 (×4): 25 mg via ORAL
  Filled 2015-02-04 (×4): qty 1

## 2015-02-04 MED ORDER — AMIODARONE HCL 200 MG PO TABS
200.0000 mg | ORAL_TABLET | Freq: Two times a day (BID) | ORAL | Status: DC
  Administered 2015-02-04 – 2015-02-06 (×5): 200 mg via ORAL
  Filled 2015-02-04 (×5): qty 1

## 2015-02-04 NOTE — Progress Notes (Signed)
Utilization review completed.  

## 2015-02-04 NOTE — Progress Notes (Signed)
Insurance check completed on Eliquis-  Per rep at [employer]:   Eliquis:  30 day retail: $229.95/ cannot tell if Josem Kaufmann is required  90 day mail order: $679.85/ cannot tell if Josem Kaufmann is required   CM to f/u with pt and provide 30 day free card

## 2015-02-04 NOTE — Progress Notes (Signed)
[facility] PROGRESS NOTE  Cathy [name] [id number] DOB: 09-Dec-1927 DOA: 01/31/2015 PCP: Gwendolyn Grant, MD  Assessment/Plan: 1. Atrial flutter - EKG shows atrial flutter with 2:1 block , cardiology has seen the patient and started on IV amiodarone, heparin infusion, with plan for TEE cardioversion . TEE cardioversion was attempted but could not be completed as poor images could not exclude thrombi in the left atrial appendage .dose of metoprolol increased from 12.5 mg twice a day to 25 mg twice a day. Started on anticoagulation with Eliquis 2.5 mg twice a day . Iv amiodarone has been changed to by mouth amiodarone 200 mg twice a day for 3 weeks then 200 mg by mouth daily. 2. ? Liver and spleen metastasis - CT scan of the abdomen showed multiple masses in the spleen which are now larger than previous CT scan. Also shows a mass in the left lobe of the liver.  All the tumor markers obtained in the hospital are negative. I called and discussed with oncology  Dr Lindi Adie, who will see the patient in his clinic next week to discuss further testing. Patient has had these lesions seen on the CT scan of the abdomen since 2010. 3. 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 Continue Percocet when necessary for pain. 4. Elevated d-dimer - patient has elevated d-dimer, CT angiogram was negative for pulmonary embolism and lower extremity venous duplex was negative for DVT. Patient has been complaining of intermittent swelling of the left thigh for quite some time.  5.  DVT prophylaxis-patient on anticoagulation with Eliquis.  Code Status:  DO NOT RESUSCITATE  Family Communication: *spoke to patient's daughter at bedside on 02/01/2015 Disposition pending improvement in patient's heart rate   Consultants:  Cardiology   Procedures:   none    Antibiotics       none     HPI/Subjective: 81 y.o. female who was  recently treated for a UTI. Patient return to urgent care today for follow-up. Told urinalysis was normal but patient found to be tachycardic. She was advised to come to the ED. Patient denies chest pains or shortness of breath. No dizziness Patient is physically active and lives independently. Per daughter at bedside, patient cleans the house on a daily basis. Patient states her appetite at home has been fine, no unexpected weight loss. No fevers or chills.   EKG in the hospital showed atrial flutter  2:1 block, cardiology was consulted and plan was to perform TEE cardioversion. Cardioversion could not be performed because of poor image quality making it impossible to exclude thrombi. Patient was started on a noted on IV amiodarone infusion which has been now changed to by mouth amiodarone. Patient also started on anticoagulation with Eliquis.  This morning patient ambulated with physical therapy, and heart rate jumped to 160s. She also complained of left upper quadrant pain on ambulation.    Objective: Filed Vitals:   02/04/15 1000 02/04/15 1334  BP: 141/85 125/81  Pulse: 106 111  Temp:  97.9 F (36.6 C)  Resp:  18    Intake/Output Summary (Last 24 hours) at 02/04/15 1407 Last data filed at 02/04/15 0800  Gross per 24 hour  Intake    340 ml  Output      0 ml  Net    340 ml   Filed Weights   01/31/15 1117  Weight: 55.792 kg (123 lb)    Exam:  General: appears in no acute distress   Cardiovascular: S1-S2 irregular  Respiratory:  clear to auscultation bilaterally   Abdomen: Soft, nontender, no organomegaly   Musculoskeletal:  no cyanosis/clubbing/edema of lower extremities    Data Reviewed: Basic Metabolic Panel:  Recent Labs Lab 01/31/15 1149 01/31/15 1946 02/03/15 1030 02/04/15 1257  NA 142  --  141 140  K 4.5  --  4.0 3.6  CL 107  --  112* 108  CO2 30  --  23 24  GLUCOSE 92  --  95 102*  BUN 10  --  5* 5*  CREATININE 0.73 0.70 0.68 0.70  CALCIUM 9.6  --   8.3* 8.2*  MG  --  2.0  --   --    Liver Function Tests:  Recent Labs Lab 01/31/15 1149  AST 24  ALT 20  ALKPHOS 58  BILITOT 0.5  PROT 6.4*  ALBUMIN 3.5   No results for input(s): LIPASE, AMYLASE in the last 168 hours. No results for input(s): AMMONIA in the last 168 hours. CBC:  Recent Labs Lab 01/31/15 0947 01/31/15 1149 01/31/15 1946 02/03/15 1030 02/04/15 0320  WBC 10.4* 9.8 8.3 8.2 11.4*  NEUTROABS  --  8.2*  --   --   --   HGB 12.4 12.7 11.3* 11.9* 10.7*  HCT 35.0* 39.5 35.8* 37.3 31.9*  MCV 89.4 95.6 96.0 94.7 92.5  PLT  --  204 215 194 159    Recent Results (from the past 240 hour(s))  Urine culture     Status: None   Collection Time: 01/31/15  9:42 AM  Result Value Ref Range Status   Colony Count NO GROWTH  Final   Organism ID, Bacteria NO GROWTH  Final  Urine culture     Status: None   Collection Time: 01/31/15 11:25 AM  Result Value Ref Range Status   Specimen Description URINE, RANDOM  Final   Special Requests NONE  Final   Culture 30,000 COLONIES/mL VIRIDANS STREPTOCOCCUS  Final   Report Status 02/03/2015 FINAL  Final     Studies: No results found.  Scheduled Meds: . amiodarone  200 mg Oral BID  . apixaban  2.5 mg Oral BID  . aspirin EC  81 mg Oral Daily  . metoprolol tartrate  25 mg Oral BID  . pantoprazole  40 mg Oral Q1200  . polyethylene glycol  17 g Oral BID  . pregabalin  50 mg Oral TID  . simethicone  80 mg Oral QID  . simvastatin  40 mg Oral q1800  . sodium chloride  1,000 mL Intravenous Once  . sodium chloride  3 mL Intravenous Q12H  . spironolactone  12.5 mg Oral BID   Continuous Infusions: . sodium chloride 10 mL/hr at 02/03/15 1634    Active Problems:   Constipation   Tachycardia   Fatigue   Abnormal CT of the abdomen   Prolonged Q-T interval on ECG   Sinus tachycardia (HCC)   Atrial flutter (HCC)   Acute systolic HF (heart failure) (HCC)   CAD (coronary artery disease), native coronary artery    Time spent:  25 min    [facility] Pager [phone]. If 7PM-7AM, please contact night-coverage at [email or web address], password [facility] 02/04/2015, 2:07 PM  LOS: 2 days

## 2015-02-04 NOTE — Progress Notes (Addendum)
       Patient Name: Cathy [name] Date of Encounter: 02/04/2015    SUBJECTIVE: She feels well. She denies dyspnea. Unfortunately, cardioversion was not performed because of poor image quality making it impossible to exclude thrombi.  TELEMETRY:  Atrial flutter with 2:1 AV conduction Filed Vitals:   02/03/15 1613 02/03/15 1952 02/04/15 0538 02/04/15 1000  BP: 164/102 144/85 126/81 141/85  Pulse: 112 109 109 106  Temp: 98 F (36.7 C) 98.3 F (36.8 C) 98.6 F (37 C)   TempSrc: Oral Oral Oral   Resp: 18 18 18    Height:      Weight:      SpO2: 95% 94% 95%     Intake/Output Summary (Last 24 hours) at 02/04/15 1143 Last data filed at 02/04/15 0800  Gross per 24 hour  Intake    340 ml  Output      0 ml  Net    340 ml   LABS: Basic Metabolic Panel:  Recent Labs  02/03/15 1030  NA 141  K 4.0  CL 112*  CO2 23  GLUCOSE 95  BUN 5*  CREATININE 0.68  CALCIUM 8.3*   CBC:  Recent Labs  02/03/15 1030 02/04/15 0320  WBC 8.2 11.4*  HGB 11.9* 10.7*  HCT 37.3 31.9*  MCV 94.7 92.5  PLT 194 159   Radiology/Studies:  No new data  Physical Exam: Blood pressure 141/85, pulse 106, temperature 98.6 F (37 C), temperature source Oral, resp. rate 18, height 4\' 10"  (1.473 m), weight 123 lb (55.792 kg), SpO2 95 %. Weight change:   Wt Readings from Last 3 Encounters:  01/31/15 123 lb (55.792 kg)  01/31/15 123 lb (55.792 kg)  01/16/15 122 lb 12.8 oz (55.702 kg)    Neck veins not distended Lungs are clear Cardiac exam reveals slight tachycardia but no gallop No edema  ASSESSMENT:  1. Acute systolic heart failure suspected to be related to tachycardia mediated left ventricular dysfunction 2. Atrial flutter with rapid ventricular response. Duration unknown. Unable to perform TEE cardioversion yesterday. Unable to exclude the possibility of atrial thrombi. This therefore relegated therapy to cardioversion after 4 weeks of therapeutic anticoagulation. 3. Hypertension, poorly  controlled  Plan:  1. Increase metoprolol 2. Continue low-dose Aldactone 3. Amiodarone 200 mg twice a day for 3 weeks then 200 mg per day 4. In 4 weeks if she remains in atrial fibrillation, elective cardioversion will be performed. 5. Eliquis will be substituted for IV heparin. 6. Potentially a discharge candidate today if we convert to oral medication, she is able tolerate oral dosing, and able to ambulate without problems. 7. Needs cardiology follow-up in 2 weeks.  Demetrios Isaacs 02/04/2015, 11:43 AM

## 2015-02-04 NOTE — Progress Notes (Addendum)
ANTICOAGULATION CONSULT NOTE - Follow Up Consult  Pharmacy Consult for Heparin Indication: atrial fibrillation  Allergies  Allergen Reactions  . Amoxicillin-Pot Clavulanate     REACTION: diarrhea  . Sulfonamide Derivatives     REACTION: unsure of reaction    Patient Measurements: Height: 4\' 10"  (147.3 cm) Weight: 123 lb (55.792 kg) IBW/kg (Calculated) : 40.9 Heparin Dosing Weight: 51 kg  Vital Signs: Temp: 98.6 F (37 C) (12/06 0538) Temp Source: Oral (12/06 0538) BP: 141/85 mmHg (12/06 1000) Pulse Rate: 106 (12/06 1000)  Labs:  Recent Labs  02/03/15 0312 02/03/15 1030 02/04/15 0320  HGB  --  11.9* 10.7*  HCT  --  37.3 31.9*  PLT  --  194 159  HEPARINUNFRC 0.58 0.50 0.56  CREATININE  --  0.68  --     Estimated Creatinine Clearance: 38.1 mL/min (by C-G formula based on Cr of 0.68).   Medications:  Scheduled:  . aspirin EC  81 mg Oral Daily  . metoprolol tartrate  12.5 mg Oral BID  . pantoprazole  40 mg Oral Q1200  . polyethylene glycol  17 g Oral BID  . pregabalin  50 mg Oral TID  . simvastatin  40 mg Oral q1800  . sodium chloride  1,000 mL Intravenous Once  . sodium chloride  3 mL Intravenous Q12H  . spironolactone  12.5 mg Oral BID   Infusions:  . sodium chloride 10 mL/hr at 02/03/15 1634  . amiodarone 30 mg/hr (02/04/15 0606)  . heparin 1,150 Units/hr (02/03/15 1634)    Assessment: 81 yo F on heparin for new atrial flutter.  Pt had TEE yesterday but was unable to do DCCV portion because they could not rule out LV thrombus.  Pt continues on heparin pending further plans from cards.  Heparin is therapeutic on 1150 units/hr.  No bleeding noted.  Goal of Therapy:  Heparin level 0.3-0.7 units/ml Monitor platelets by anticoagulation protocol: Yes   Plan:  Continue heparin at 1150 units/hr Continue daily heparin level and CBC Follow-up plans for oral anticoagulation Follow-up plans for ACEi with EF 25-30%  Horton Chin, Pharm.D., BCPS Clinical  Pharmacist Pager [phone] 02/04/2015 10:54 AM   Addendum: To start Apixaban for atrial flutter.  Based on patient criteria will need Apixaban 2.5mg  BID dosing.  Will d/c heparin and associated labs.  [employer], Pharm.D., BCPS Clinical Pharmacist Pager [phone] 02/04/2015 11:58 AM

## 2015-02-04 NOTE — Discharge Instructions (Signed)

## 2015-02-04 NOTE — Progress Notes (Signed)
Physical Therapy Treatment Patient Details Name: Cathy [name] MRN: [id number] DOB: 09/29/1927 Today's Date: 02/04/2015    History of Present Illness Cathy [name] is a 81 y.o. female who was recently treated for a UTI. Patient return to urgent care today for follow-up. Told urinalysis was normal but patient found to be tachycardic. She was advised to come to the ED. Patient denies chest pains or shortness of breath. No dizziness Patient is physically active and lives independently. Per daughter at bedside, patient cleans the house on a daily basis. Patient states her appetite at home has been fine, no unexpected weight loss. No fevers or chills. She has chronic constipation    PT Comments    Progressing steadily.  Feels closer to baseline, but having ?gas pains that have her worried and during treatment  EHR jumped through 140's to recorded high of 161 bpm  Follow Up Recommendations  Home health PT;Supervision - Intermittent     Equipment Recommendations  None recommended by PT    Recommendations for Other Services       Precautions / Restrictions Precautions Precautions: Fall Restrictions Weight Bearing Restrictions: No    Mobility  Bed Mobility Overal bed mobility: Independent                Transfers Overall transfer level: Needs assistance Equipment used: Rolling walker (2 wheeled) Transfers: Sit to/from Stand Sit to Stand: Supervision         General transfer comment: cues for hand placement   Ambulation/Gait Ambulation/Gait assistance: Supervision Ambulation Distance (Feet): 280 Feet Assistive device: Rolling walker (2 wheeled)     Gait velocity interpretation: at or above normal speed for age/gender General Gait Details: pt chooses RW over her cane, but ambulates steadily   Stairs Stairs: Yes Stairs assistance: Supervision Stair Management: Two rails;Forwards;Alternating pattern Number of Stairs: 3 General stair comments: safe with  rails  Wheelchair Mobility    Modified Rankin (Stroke Patients Only)       Balance Overall balance assessment: Needs assistance Sitting-balance support: No upper extremity supported Sitting balance-Leahy Scale: Good     Standing balance support: No upper extremity supported Standing balance-Leahy Scale: Fair Standing balance comment: prefers UE support                    Cognition Arousal/Alertness: Awake/alert Behavior During Therapy: WFL for tasks assessed/performed Overall Cognitive Status: Within Functional Limits for tasks assessed                      Exercises      General Comments General comments (skin integrity, edema, etc.): During gait SpO2 generally 95% or above.  EHR  110's at rest and reached 140's to 160 likely on or before the negotiating steps.  Pt tired, but assymptomatic of event.      Pertinent Vitals/Pain Pain Assessment: 0-10 Pain Score: 10-Worst pain ever Pain Location: left flank Pain Descriptors / Indicators: Sharp Pain Intervention(s): Patient requesting pain meds-RN notified    Home Living                      Prior Function            PT Goals (current goals can now be found in the care plan section) Acute Rehab PT Goals Patient Stated Goal: to go home PT Goal Formulation: With patient Time For Goal Achievement: 02/15/15 Potential to Achieve Goals: Good Progress towards PT goals: Progressing toward goals  Frequency  Min 3X/week    PT Plan Current plan remains appropriate    Co-evaluation             End of Session   Activity Tolerance: Patient tolerated treatment well;Patient limited by fatigue Patient left: in bed;with call bell/phone within reach;with family/visitor present;with nursing/sitter in room     Time: 1213-1245 PT Time Calculation (min) (ACUTE ONLY): 32 min  Charges:  $Gait Training: 8-22 mins $Therapeutic Activity: 8-22 mins                    G Codes:      Doyel Mulkern,  Tessie Fass 02/04/2015, 1:19 PM 02/04/2015  Donnella Sham, PT [phone]  (pager)

## 2015-02-05 LAB — BASIC METABOLIC PANEL
Anion gap: 7 (ref 5–15)
BUN: 6 mg/dL (ref 6–20)
CALCIUM: 8 mg/dL — AB (ref 8.9–10.3)
CHLORIDE: 107 mmol/L (ref 101–111)
CO2: 26 mmol/L (ref 22–32)
Creatinine, Ser: 0.73 mg/dL (ref 0.44–1.00)
GFR calc Af Amer: >60 mL/min (ref >60)
GFR calc non Af Amer: >60 mL/min (ref >60)
GLUCOSE: 89 mg/dL (ref 65–99)
Potassium: 3 mmol/L — ABNORMAL LOW (ref 3.5–5.1)
SODIUM: 140 mmol/L (ref 135–145)

## 2015-02-05 LAB — CBC
HCT: 33.3 % — ABNORMAL LOW (ref 36.0–46.0)
HEMOGLOBIN: 10.6 g/dL — AB (ref 12.0–15.0)
MCH: 30.1 pg (ref 26.0–34.0)
MCHC: 31.8 g/dL (ref 30.0–36.0)
MCV: 94.6 fL (ref 78.0–100.0)
Platelets: 176 10*3/uL (ref 150–400)
RBC: 3.52 MIL/uL — ABNORMAL LOW (ref 3.87–5.11)
RDW: 14.1 % (ref 11.5–15.5)
WBC: 7.9 10*3/uL (ref 4.0–10.5)

## 2015-02-05 LAB — MAGNESIUM: Magnesium: 1.9 mg/dL (ref 1.7–2.4)

## 2015-02-05 MED ORDER — POTASSIUM CHLORIDE CRYS ER 10 MEQ PO TBCR
10.0000 meq | EXTENDED_RELEASE_TABLET | Freq: Every day | ORAL | Status: DC
Start: 2015-02-06 — End: 2015-02-06
  Administered 2015-02-06: 10 meq via ORAL
  Filled 2015-02-05: qty 1

## 2015-02-05 MED ORDER — POTASSIUM CHLORIDE CRYS ER 20 MEQ PO TBCR
40.0000 meq | EXTENDED_RELEASE_TABLET | Freq: Two times a day (BID) | ORAL | Status: AC
  Administered 2015-02-05 (×2): 40 meq via ORAL
  Filled 2015-02-05 (×2): qty 2

## 2015-02-05 MED ORDER — SPIRONOLACTONE 25 MG PO TABS
12.5000 mg | ORAL_TABLET | Freq: Every day | ORAL | Status: DC
  Administered 2015-02-05 – 2015-02-06 (×2): 12.5 mg via ORAL
  Filled 2015-02-05 (×2): qty 1

## 2015-02-05 NOTE — Anesthesia Postprocedure Evaluation (Signed)
Anesthesia Post Note  Patient: Cathy [name]  Procedure(s) Performed: Procedure(s) (LRB): TRANSESOPHAGEAL ECHOCARDIOGRAM (TEE) (N/A) CARDIOVERSION (N/A)  Patient location during evaluation: PACU Anesthesia Type: MAC Level of consciousness: awake and alert Pain management: pain level controlled Vital Signs Assessment: post-procedure vital signs reviewed and stable Respiratory status: spontaneous breathing, nonlabored ventilation, respiratory function stable and patient connected to nasal cannula oxygen Cardiovascular status: stable and blood pressure returned to baseline Anesthetic complications: no    Last Vitals:  Filed Vitals:   02/04/15 2054 02/04/15 2238  BP: 123/79 128/80  Pulse: 107 109  Temp: 36.8 C   Resp: 18     Last Pain:  Filed Vitals:   02/04/15 2246  PainSc: 4                  Lilliona Blakeney DAVID

## 2015-02-05 NOTE — Progress Notes (Signed)
Patient ambulated 150 x1 assist with walker, patient with one standing rest break she stated she was "tired" back in room resting sitting on bed, while ambulating heart rate up in the 130-140s on monitor. At rest heart rate 106 will continue to monitor patient.Miller Edgington, Bettina Gavia RN

## 2015-02-05 NOTE — Care Management Important Message (Signed)
Important Message  Patient Details  Name: Cathy [name] MRN: [id number] Date of Birth: 1927/08/06   Medicare Important Message Given:  Yes    Nathen May 02/05/2015, 11:43 AM

## 2015-02-05 NOTE — Progress Notes (Addendum)
Patient with 2.4 second pause on monitor, patient rate now 105 on monitor. Patient asymptomatic, talking on telephone, will monitor patient. Shandrea Lusk, Bettina Gavia RN [id number] Dr Allyson Sabal and Cardiology [facility] Ellen Henri  made aware of pt monitor pause, no new orders received will continue to monitor patient. Lataja Newland, Bettina Gavia RN

## 2015-02-05 NOTE — Progress Notes (Signed)
Physical Therapy Treatment Patient Details Name: Cathy [name] MRN: [id number] DOB: February 15, 1928 Today's Date: 02/05/2015    History of Present Illness Cathy [name] is a 81 y.o. female who was recently treated for a UTI. Patient return to urgent care today for follow-up. Told urinalysis was normal but patient found to be tachycardic. She was advised to come to the ED. Patient denies chest pains or shortness of breath. No dizziness Patient is physically active and lives independently. Per daughter at bedside, patient cleans the house on a daily basis. Patient states her appetite at home has been fine, no unexpected weight loss. No fevers or chills. She has chronic constipation    PT Comments    Improved mobility, but EHR still labile during ambulation and increases into the 140's   Follow Up Recommendations  Home health PT;Supervision - Intermittent     Equipment Recommendations  None recommended by PT    Recommendations for Other Services       Precautions / Restrictions Precautions Precautions: Fall    Mobility  Bed Mobility Overal bed mobility: Independent                Transfers Overall transfer level: Needs assistance Equipment used: Rolling walker (2 wheeled) Transfers: Sit to/from Stand Sit to Stand: Supervision            Ambulation/Gait Ambulation/Gait assistance: Supervision Ambulation Distance (Feet): 170 Feet Assistive device: Rolling walker (2 wheeled) Gait Pattern/deviations: Step-through pattern Gait velocity: slower   General Gait Details: Much more steady than on eval when she had abdominal pain   Stairs            Wheelchair Mobility    Modified Rankin (Stroke Patients Only)       Balance     Sitting balance-Leahy Scale: Good     Standing balance support: Bilateral upper extremity supported;No upper extremity supported Standing balance-Leahy Scale: Fair                      Cognition Arousal/Alertness:  Awake/alert Behavior During Therapy: WFL for tasks assessed/performed Overall Cognitive Status: Within Functional Limits for tasks assessed                      Exercises General Exercises - Lower Extremity Hip ABduction/ADduction: AROM;Strengthening;Both;10 reps;Standing Hip Flexion/Marching: AROM;Strengthening;Both;10 reps;Standing    General Comments General comments (skin integrity, edema, etc.): SpO2 stayed >95%, but during gait EHR (afib) jumped up to 140's briefly otherwise was in the 100's and 110's      Pertinent Vitals/Pain Pain Assessment: No/denies pain    Home Living                      Prior Function            PT Goals (current goals can now be found in the care plan section) Acute Rehab PT Goals Patient Stated Goal: to go home PT Goal Formulation: With patient Time For Goal Achievement: 02/15/15 Potential to Achieve Goals: Good Progress towards PT goals: Progressing toward goals    Frequency  Min 3X/week    PT Plan Current plan remains appropriate    Co-evaluation             End of Session     Patient left: in bed;with call bell/phone within reach     Time: [id number] PT Time Calculation (min) (ACUTE ONLY): 22 min  Charges:  $Gait Training: 8-22 mins  G Codes:      Stanislawa Gaffin, Tessie Fass 02/05/2015, 5:02 PM 02/05/2015  Donnella Sham, [place] [phone]  (pager)

## 2015-02-05 NOTE — Progress Notes (Signed)
Pharmacist Heart Failure Core Measure Documentation  Assessment: KEMELY ANTIN has an EF documented as 25-30% on 12/5 by ECHO.  Rationale: Heart failure patients with left ventricular systolic dysfunction (LVSD) and an EF < 40% should be prescribed an angiotensin converting enzyme inhibitor (ACEI) or angiotensin receptor blocker (ARB) at discharge unless a contraindication is documented in the medical record.  This patient is not currently on an ACEI or ARB for HF.  This note is being placed in the record in order to provide documentation that a contraindication to the use of these agents is present for this encounter.  ACE Inhibitor or Angiotensin Receptor Blocker is contraindicated (specify all that apply)  []   ACEI allergy AND ARB allergy []   Angioedema []   Moderate or severe aortic stenosis []   Hyperkalemia [x]   Hypotension []   Renal artery stenosis []   Worsening renal function, preexisting renal disease or dysfunction   Fread Kottke, Rocky Crafts 02/05/2015 11:29 AM

## 2015-02-05 NOTE — Progress Notes (Addendum)
       Patient Name: Cathy [name] Date of Encounter: 02/05/2015    SUBJECTIVE: Doing well. Now on a totally oral regimen for atrial flutter and acute systolic heart failure. Unfortunately TEE cardioversion was not possible because the left atrial appendage could not be adequately visualized. Therefore the treatment plan will be additional anticoagulation and outpatient electrical cardioversion. I discussed this with the patient and family.  TELEMETRY:  Atrial flutter with 3-1 AV conduction and ventricular rate 100 bpm which accelerates to 150 bpm with activity. During sleep heart rate is as low as 60. Filed Vitals:   02/04/15 1000 02/04/15 1334 02/04/15 2054 02/04/15 2238  BP: 141/85 125/81 123/79 128/80  Pulse: 106 111 107 109  Temp:  97.9 F (36.6 C) 98.2 F (36.8 C)   TempSrc:  Oral Oral   Resp:  18 18   Height:      Weight:      SpO2:  99% 95%     Intake/Output Summary (Last 24 hours) at 02/05/15 0843 Last data filed at 02/04/15 1740  Gross per 24 hour  Intake    120 ml  Output      0 ml  Net    120 ml   LABS: Basic Metabolic Panel:  Recent Labs  02/04/15 1257 02/05/15 0246  NA 140 140  K 3.6 3.0*  CL 108 107  CO2 24 26  GLUCOSE 102* 89  BUN 5* 6  CREATININE 0.70 0.73  CALCIUM 8.2* 8.0*   CBC:  Recent Labs  02/04/15 0320 02/05/15 0246  WBC 11.4* 7.9  HGB 10.7* 10.6*  HCT 31.9* 33.3*  MCV 92.5 94.6  PLT 159 176   Radiology/Studies:  No new data  Physical Exam: Blood pressure 128/80, pulse 109, temperature 98.2 F (36.8 C), temperature source Oral, resp. rate 18, height 4\' 10"  (1.473 m), weight 123 lb (55.792 kg), SpO2 95 %. Weight change:   Wt Readings from Last 3 Encounters:  01/31/15 123 lb (55.792 kg)  01/31/15 123 lb (55.792 kg)  01/16/15 122 lb 12.8 oz (55.702 kg)   No significant JVD Lungs are clear Kyphoscoliosis is noted Tachycardias from auscultation. No gallop is heard. No edema is noted.   ASSESSMENT:  1. Atrial flutter  with variable AV conduction despite medical therapy to enhance slowing of the ventricular response. Patient is clinically stable. 2. Acute systolic heart failure likely related to tachycardia of unknown duration. 3. Hypokalemia 4. Possible malignancy with abnormal abdominal CT scan noted in 2010 and with progression on recent CT this admission. 5. Anticoagulation therapy  Plan:  1. All therapy has been converted to oral therapy and includes Eliquis 2.5 mg twice a day, amiodarone 200 mg twice a day, spironolactone 12.5 mg per day, metoprolol tartrate 25 mg twice a day, K Dur 10 mEq per day. 2. Supplemental potassium given today. We must document the potassium is normal prior to discharge. Hypokalemia and amiodarone therapy are not good partners. 3. Plan outpatient follow-up in 2 weeks and elective cardioversion in 4 weeks. 4. Ambulate to ensure the patient is able tolerate current medical regimen prior to discharge. Therefore discharge will probably be late this afternoon or in a.m. 5. I will discontinue aspirin therapy. 6. Detailed discussion with family. Demetrios Isaacs 02/05/2015, 8:43 AM

## 2015-02-05 NOTE — Care Management Note (Signed)
Case Management Note  Patient Details  Name: Cathy [name] MRN: [id number] Date of Birth: February 17, 1928  Subjective/Objective:      Pt admitted with tachycardia              Action/Plan:  Pt is independent from home   Expected Discharge Date:  01/31/15               Expected Discharge Plan:  Home/Self Care (Pt is independent from home with strong family support close by.  Pt uses cane when out in the community)  In-House Referral:     Discharge planning Services  CM Consult  Post Acute Care Choice:    Choice offered to:     DME Arranged:    DME Agency:     HH Arranged:   PT HH Agency:   [facility]  Status of Service:  In process, will continue to follow  Medicare Important Message Given:  Yes Date Medicare IM Given:    Medicare IM give by:    Date Additional Medicare IM Given:    Additional Medicare Important Message give by:     If discussed at [place] of Stay Meetings, dates discussed:    Additional Comments: 02/05/2015 CM reassessed pt; pt agreed to [place], post offering choice pt chose [facility], agency contacted and referral accepted.  CM submitted benefit check for Eliquis: Insurance check completed on Eliquis-  Per rep at [employer]:   Eliquis:  30 day retail: $229.95/ cannot tell if Josem Kaufmann is required  90 day mail order: $679.85/ cannot tell if Josem Kaufmann is required   CM to f/u with pt and provide 30 day free card         CM provided free 30 day card for Eliquis, CM also informed pt of copay post initial 30 day inventory.  Pt daughter-in-law [name]  at bedside and stated family would ensure that pt can pay copay.     02/03/15 :CM assessed pt.  Pt does not wish to participate in HHPT at this time, states she wants to wait and see how she does.  Pt is scheduled for TEE/Cardioversion today, CM will reassess post procedure. [name], RN 02/05/2015, 12:06 PM

## 2015-02-05 NOTE — Progress Notes (Signed)
[facility] PROGRESS NOTE  Cathy [name] [id number] DOB: 02/01/1928 DOA: 01/31/2015 PCP: Gwendolyn Grant, MD  Assessment/Plan: 1. Atrial flutter - EKG shows atrial flutter with 2:1 block , cardiology has seen the patient and started on Eliquis 2.5 mg twice a day, amiodarone 200 mg twice a day, spironolactone 12.5 mg per day, metoprolol tartrate 25 mg twice a day, patient underwent TEE cardioversion, Unfortunately TEE cardioversion was not possible because the left atrial appendage could not be adequately visualized.  . Iv amiodarone has been changed to by mouth amiodarone 200 mg twice a day for 3 weeks then 200 mg by mouth daily.   2. ? Liver and spleen metastasis - CT scan of the abdomen showed multiple masses in the spleen which are now larger than previous CT scan. Also shows a mass in the left lobe of the liver.  All the tumor markers obtained in the hospital are negative. I called and discussed with oncology  Dr Inda Merlin , who will see the patient in his clinic next week to discuss further testing. Patient has had these lesions seen on the CT scan of the abdomen since 2010.   3. 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 Continue Percocet when necessary for pain.   4. Elevated d-dimer - patient has elevated d-dimer, CT angiogram was negative for pulmonary embolism and lower extremity venous duplex was negative for DVT. Patient has been complaining of intermittent swelling of the left thigh for quite some time.    5.  DVT prophylaxis-patient on anticoagulation with Eliquis.  6.          Hypokalemia-replete and check magnesium  Code Status:  DO NOT RESUSCITATE  Family Communication: *spoke to patient's daughter in law  at bedside on 02/05/2015 Disposition pending improvement in patient's heart rate , anticipate discharge tomorrow   Consultants:  Cardiology   Procedures:   none     Antibiotics       none     HPI/Subjective: 81 y.o. female who was recently treated for a UTI. Patient return to urgent care today for follow-up. Told urinalysis was normal but patient found to be tachycardic. She was advised to come to the ED. Patient denies chest pains or shortness of breath. No dizziness Patient is physically active and lives independently. Per daughter at bedside, patient cleans the house on a daily basis. Patient states her appetite at home has been fine, no unexpected weight loss. No fevers or chills.   EKG in the hospital showed atrial flutter  2:1 block, cardiology was consulted and plan was to perform TEE cardioversion. Cardioversion could not be performed because of poor image quality making it impossible to exclude thrombi. Patient was started on a noted on IV amiodarone infusion which has been now changed to by mouth amiodarone. Patient also started on anticoagulation with Eliquis.  TELEMETRY: Atrial flutter with 3-1 AV conduction and ventricular rate 100 bpm which accelerates to 150 bpm with activity. During sleep heart rate is as low as 60.    Objective: Filed Vitals:   02/04/15 2054 02/05/15 0931  BP:  121/86  Pulse:  107  Temp: 98.2 F (36.8 C)   Resp: 18     Intake/Output Summary (Last 24 hours) at 02/05/15 0946 Last data filed at 02/04/15 1740  Gross per 24 hour  Intake    120 ml  Output      0 ml  Net  120 ml   Filed Weights   01/31/15 1117  Weight: 55.792 kg (123 lb)    Exam:   General: appears in no acute distress   Cardiovascular: S1-S2 irregular  Respiratory:  clear to auscultation bilaterally   Abdomen: Soft, nontender, no organomegaly   Musculoskeletal:  no cyanosis/clubbing/edema of lower extremities    Data Reviewed: Basic Metabolic Panel:  Recent Labs Lab 01/31/15 1149 01/31/15 1946 02/03/15 1030 02/04/15 1257 02/05/15 0246  NA 142  --  141 140 140  K 4.5  --  4.0 3.6 3.0*  CL 107  --  112* 108 107  CO2  30  --  23 24 26   GLUCOSE 92  --  95 102* 89  BUN 10  --  5* 5* 6  CREATININE 0.73 0.70 0.68 0.70 0.73  CALCIUM 9.6  --  8.3* 8.2* 8.0*  MG  --  2.0  --   --   --    Liver Function Tests:  Recent Labs Lab 01/31/15 1149  AST 24  ALT 20  ALKPHOS 58  BILITOT 0.5  PROT 6.4*  ALBUMIN 3.5   No results for input(s): LIPASE, AMYLASE in the last 168 hours. No results for input(s): AMMONIA in the last 168 hours. CBC:  Recent Labs Lab 01/31/15 1149 01/31/15 1946 02/03/15 1030 02/04/15 0320 02/05/15 0246  WBC 9.8 8.3 8.2 11.4* 7.9  NEUTROABS 8.2*  --   --   --   --   HGB 12.7 11.3* 11.9* 10.7* 10.6*  HCT 39.5 35.8* 37.3 31.9* 33.3*  MCV 95.6 96.0 94.7 92.5 94.6  PLT 204 215 194 159 176    Recent Results (from the past 240 hour(s))  Urine culture     Status: None   Collection Time: 01/31/15  9:42 AM  Result Value Ref Range Status   Colony Count NO GROWTH  Final   Organism ID, Bacteria NO GROWTH  Final  Urine culture     Status: None   Collection Time: 01/31/15 11:25 AM  Result Value Ref Range Status   Specimen Description URINE, RANDOM  Final   Special Requests NONE  Final   Culture 30,000 COLONIES/mL VIRIDANS STREPTOCOCCUS  Final   Report Status 02/03/2015 FINAL  Final     Studies: No results found.  Scheduled Meds: . amiodarone  200 mg Oral BID  . apixaban  2.5 mg Oral BID  . metoprolol tartrate  25 mg Oral BID  . pantoprazole  40 mg Oral Q1200  . polyethylene glycol  17 g Oral BID  . [START ON 02/06/2015] potassium chloride  10 mEq Oral Daily  . potassium chloride  40 mEq Oral BID  . pregabalin  50 mg Oral TID  . simvastatin  40 mg Oral q1800  . sodium chloride  1,000 mL Intravenous Once  . sodium chloride  3 mL Intravenous Q12H  . spironolactone  12.5 mg Oral Daily   Continuous Infusions: . sodium chloride 10 mL/hr at 02/03/15 1634    Active Problems:   Constipation   Tachycardia   Fatigue   Abnormal CT of the abdomen   Prolonged Q-T interval on  ECG   Sinus tachycardia (HCC)   Atrial flutter (HCC)   Acute systolic HF (heart failure) (HCC)   CAD (coronary artery disease), native coronary artery    Time spent: 25 min    [facility] Pager [phone]. If 7PM-7AM, please contact night-coverage at [email or web address], password [facility] 02/05/2015, 9:46 AM  LOS: 3 days

## 2015-02-05 NOTE — Progress Notes (Signed)
Pt asymptomatic and resting, pt had an episode of non-sustained bradycardia that decreased to 40 bpm but immediately returned to 80 bpm, RN will continue to monitor, call bell within reach and bed in lowest position.   02/05/2015 Izola Price, RN

## 2015-02-06 DIAGNOSIS — I4581 Long QT syndrome: Secondary | ICD-10-CM

## 2015-02-06 LAB — COMPREHENSIVE METABOLIC PANEL
ALBUMIN: 3 g/dL — AB (ref 3.5–5.0)
ALT: 13 U/L — ABNORMAL LOW (ref 14–54)
ANION GAP: 7 (ref 5–15)
AST: 15 U/L (ref 15–41)
Alkaline Phosphatase: 47 U/L (ref 38–126)
BILIRUBIN TOTAL: 0.7 mg/dL (ref 0.3–1.2)
BUN: 7 mg/dL (ref 6–20)
CHLORIDE: 109 mmol/L (ref 101–111)
CO2: 25 mmol/L (ref 22–32)
Calcium: 8.6 mg/dL — ABNORMAL LOW (ref 8.9–10.3)
Creatinine, Ser: 0.69 mg/dL (ref 0.44–1.00)
GFR calc Af Amer: >60 mL/min (ref >60)
GFR calc non Af Amer: >60 mL/min (ref >60)
GLUCOSE: 91 mg/dL (ref 65–99)
POTASSIUM: 4.2 mmol/L (ref 3.5–5.1)
Sodium: 141 mmol/L (ref 135–145)
TOTAL PROTEIN: 5.6 g/dL — AB (ref 6.5–8.1)

## 2015-02-06 LAB — CBC
HEMATOCRIT: 35.3 % — AB (ref 36.0–46.0)
Hemoglobin: 11.6 g/dL — ABNORMAL LOW (ref 12.0–15.0)
MCH: 30.8 pg (ref 26.0–34.0)
MCHC: 32.9 g/dL (ref 30.0–36.0)
MCV: 93.6 fL (ref 78.0–100.0)
PLATELETS: 185 10*3/uL (ref 150–400)
RBC: 3.77 MIL/uL — ABNORMAL LOW (ref 3.87–5.11)
RDW: 14 % (ref 11.5–15.5)
WBC: 7.5 10*3/uL (ref 4.0–10.5)

## 2015-02-06 MED ORDER — APIXABAN 2.5 MG PO TABS: 2.5000 mg | ORAL_TABLET | Freq: Two times a day (BID) | ORAL | Status: DC

## 2015-02-06 MED ORDER — AMIODARONE HCL 200 MG PO TABS: 200.0000 mg | ORAL_TABLET | Freq: Two times a day (BID) | ORAL | Status: DC

## 2015-02-06 MED ORDER — ATORVASTATIN CALCIUM 20 MG PO TABS: 20.0000 mg | ORAL_TABLET | Freq: Every day | ORAL | Status: DC

## 2015-02-06 MED ORDER — ATORVASTATIN CALCIUM 20 MG PO TABS
20.0000 mg | ORAL_TABLET | Freq: Every day | ORAL | Status: DC
Start: 2015-02-06 — End: 2015-02-06

## 2015-02-06 MED ORDER — SPIRONOLACTONE 25 MG PO TABS: 12.5000 mg | ORAL_TABLET | Freq: Every day | ORAL | Status: DC

## 2015-02-06 MED ORDER — METOPROLOL TARTRATE 25 MG PO TABS: 25.0000 mg | ORAL_TABLET | Freq: Two times a day (BID) | ORAL | Status: DC

## 2015-02-06 MED ORDER — POTASSIUM CHLORIDE CRYS ER 10 MEQ PO TBCR: 10.0000 meq | EXTENDED_RELEASE_TABLET | Freq: Every day | ORAL | Status: DC

## 2015-02-06 NOTE — Discharge Planning (Signed)
Physician Discharge Summary  Cathy [name] MRN: [id number] DOB/AGE: 1927-05-26 81 y.o.  PCP: Gwendolyn Grant, MD   Admit date: 01/31/2015 Discharge date: 02/06/2015  Discharge Diagnoses:     Active Problems:   Constipation   Tachycardia   Fatigue   Abnormal CT of the abdomen   Prolonged Q-T interval on ECG   Sinus tachycardia (HCC)   Atrial flutter (HCC)   Acute systolic HF (heart failure) (HCC)   CAD (coronary artery disease), native coronary artery    Follow-up recommendations Follow-up with PCP in 3-5 days , including all  additional recommended appointments as below Follow-up CBC, CMP in 3-5 days  Electrical cardioversion in 3-4 weeks.  Cardiology follow-up in 10-14 days. Oncology follow-up with Dr. Julien Nordmann     Medication List    STOP taking these medications        aspirin 81 MG tablet     ciprofloxacin 500 MG tablet  Commonly known as:  CIPRO      TAKE these medications        amiodarone 200 MG tablet  Commonly known as:  PACERONE  Take 1 tablet (200 mg total) by mouth 2 (two) times daily.     apixaban 2.5 MG Tabs tablet  Commonly known as:  ELIQUIS  Take 1 tablet (2.5 mg total) by mouth 2 (two) times daily.     calcium-vitamin D 500-200 MG-UNIT tablet  Commonly known as:  OSCAL WITH D  Take 1 tablet by mouth daily.     cholecalciferol 1000 UNITS tablet  Commonly known as:  VITAMIN D  Take 1,000 Units by mouth daily.     docusate sodium 100 MG capsule  Commonly known as:  COLACE  Take 1 capsule (100 mg total) by mouth every 12 (twelve) hours.     esomeprazole 40 MG capsule  Commonly known as:  NEXIUM  Take 1 capsule (40 mg total) by mouth daily.     FEOSOL 200 (65 FE) MG Tabs  Generic drug:  Ferrous Sulfate Dried  Take 1 tablet by mouth 2 (two) times daily. Take with a vitamin c 527m     fexofenadine 180 MG tablet  Commonly known as:  ALLEGRA  Take 180 mg by mouth daily as needed for allergies or rhinitis.     FLECTOR 1.3 % Ptch   Generic drug:  diclofenac  Place 1 patch onto the skin every 12 (twelve) hours. Apply as directed by Dr. BBrien Few    metoprolol tartrate 25 MG tablet  Commonly known as:  LOPRESSOR  Take 1 tablet (25 mg total) by mouth 2 (two) times daily.     oxyCODONE-acetaminophen 5-325 MG tablet  Commonly known as:  PERCOCET/ROXICET  Take 1 tablet by mouth every 6 (six) hours as needed for moderate pain.     polyethylene glycol packet  Commonly known as:  MIRALAX / GLYCOLAX  Take 17 g by mouth 2 (two) times daily.     potassium chloride 10 MEQ tablet  Commonly known as:  K-DUR,KLOR-CON  Take 1 tablet (10 mEq total) by mouth daily.     pregabalin 50 MG capsule  Commonly known as:  LYRICA  Take 1 capsule (50 mg total) by mouth 3 (three) times daily.     PROLIA 60 MG/ML Soln injection  Generic drug:  denosumab  Inject 60 mg into the skin every 6 (six) months.     simvastatin 40 MG tablet  Commonly known as:  ZOCOR  TAKE 1 TABLET AT BEDTIME  solifenacin 10 MG tablet  Commonly known as:  VESICARE  Take 1 tablet (10 mg total) by mouth daily.     spironolactone 25 MG tablet  Commonly known as:  ALDACTONE  Take 0.5 tablets (12.5 mg total) by mouth daily.     vitamin B-12 1000 MCG tablet  Commonly known as:  CYANOCOBALAMIN  Take 1 tablet (1,000 mcg total) by mouth daily.         Discharge Condition:    Discharge Instructions       Discharge Instructions    Diet - low sodium heart healthy    Complete by:  As directed      Increase activity slowly    Complete by:  As directed            Allergies  Allergen Reactions  . Amoxicillin-Pot Clavulanate     REACTION: diarrhea  . Sulfonamide Derivatives     REACTION: unsure of reaction      Disposition: 01-Home or Self Care   Consults: Cardiology      Significant Diagnostic Studies:  Ct Angio Chest Pe W/cm &/or Wo Cm  02/01/2015  CLINICAL DATA:  Tachycardia. EXAM: CT ANGIOGRAPHY CHEST WITH CONTRAST TECHNIQUE:  Multidetector CT imaging of the chest was performed using the standard protocol during bolus administration of intravenous contrast. Multiplanar CT image reconstructions and MIPs were obtained to evaluate the vascular anatomy. CONTRAST:  45m OMNIPAQUE IOHEXOL 350 MG/ML SOLN COMPARISON:  None FINDINGS: THORACIC INLET/BODY WALL: No acute abnormality. MEDIASTINUM: Cardiomegaly with particularly notable right atrial enlargement. There is mitral annular ossification. Atherosclerosis, including the coronary arteries. Limited opacification of the aorta without evidence of acute aortic disease. No pericardial effusion. Large main pulmonary arteries, with the right branch measuring 28 mm. This could reflect pulmonary hypertension. No evidence of pulmonary embolism. Large hiatal hernia. LUNG WINDOWS: Small pleural effusions with basilar atelectasis. No indication of pneumonia or edema. UPPER ABDOMEN: No acute findings. Low-density 3 cm mass in the left adrenal gland is stable from 2010 myelogram and consistent with adenoma. OSSEOUS: Severe right glenohumeral arthropathy with joint effusion and degenerative cyst in the subscapular fossa. Severe and diffuse thoracic disc degeneration. No acute osseous finding. Review of the MIP images confirms the above findings. IMPRESSION: 1. Negative for pulmonary embolism. 2. Small pleural effusions and bibasilar atelectasis. 3. Large hiatal hernia. Electronically Signed   By: JMonte FantasiaM.D.   On: 02/01/2015 11:31   Ct Abdomen Pelvis W Contrast  01/31/2015  CLINICAL DATA:  Tachycardia, lower abdominal pain. Recently treated for urinary tract infection. History of colitis, diverticulosis and hiatal hernia. EXAM: CT ABDOMEN AND PELVIS WITH CONTRAST TECHNIQUE: Multidetector CT imaging of the abdomen and pelvis was performed using the standard protocol following bolus administration of intravenous contrast. CONTRAST:  5051mOMNIPAQUE IOHEXOL 300 MG/ML  SOLN COMPARISON:  Previous exams  including CT abdomen and pelvis dated 04/19/2009 and 08/29/2008. FINDINGS: Lower chest: There is mild bibasilar scarring/atelectasis and small bilateral pleural effusions. Moderate to large hiatal hernia is unchanged. Hepatobiliary: There is slightly increased intrahepatic and extrahepatic bile duct dilatation, common bile duct now dilated to approximately 14 mm (previously measured at 12.5 mm). No obstructing bile duct stone or bile duct mass identified. There is a new enhancing lesion within the inferior left hepatic lobe, likely segment 4B, measuring 2.7 x 1.5 cm, worrisome for metastasis. No other enhancing mass or lesion identified within the liver. Patient is status post cholecystectomy. Pancreas: No mass, inflammatory changes, or other significant abnormality. No significant  pancreatic duct dilatation. Spleen: There are numerous hypodense masses within the spleen, increased significantly in number compared to previous studies, and largest lesions have increased in size. Largest lesion within the posterior aspect of the spleen measures 2.8 x 2.7 cm (2.1 cm greatest dimension on the previous exam, and approximately 1.5 cm on the earlier exam of 08/29/2008). Next largest lesion within the anterior spleen measures 2.7 x 2.3 cm (measured at 2.4 cm greatest dimension on the previous exam). The increase in size and number suggests neoplastic process. Adrenals/Urinary Tract: Left adrenal mass is slightly decreased in prominence measuring 3.2 x 2.1 cm on today's exam (previously measured at 3.4 x 2.5 cm). Right adrenal gland is normal. Both kidneys are unremarkable without stone or hydronephrosis. No ureteral or bladder calculi identified. Bladder is unremarkable. Stomach/Bowel: Bowel is normal in caliber. Scattered diverticulosis noted within the descending and sigmoid colon without evidence of acute diverticulitis. Bowel is difficult to definitively characterize without oral contrast but there is no obvious evidence  of bowel wall thickening or bowel wall inflammation. Appendix is not seen but there are no inflammatory changes about the cecum to suggest acute appendicitis. Vascular/Lymphatic: Scattered atherosclerotic changes again noted along the walls of the ectatic but normal-caliber abdominal aorta. No acute vascular abnormality seen. No enlarged lymph nodes identified in the abdomen or pelvis. Reproductive: No mass or other significant abnormality. Other: No free fluid or abscess collections seen. No free intraperitoneal air. Heart is at least mildly enlarged, incompletely imaged at the upper portion of this exam. Musculoskeletal: Degenerative changes are seen throughout the slightly scoliotic thoracolumbar spine but no acute osseous abnormality. No evidence of osseous metastasis seen. Evidence of a previous surgical decompression of the central canal noted in the lower lumbar spine. IMPRESSION: 1. Increased number and size of the numerous splenic masses indicating neoplastic process, possibly lymphoma or metastasis as suggested on the previous exam. 2. Stable, or slightly smaller, left adrenal mass. This also remains suspicious for neoplastic process. 3. New enhancing lesion within the left hepatic lobe, segment 4B, measuring 2.8 x 1.5 cm. This is also suspicious for metastatic disease. 4. Mildly increased intrahepatic and extrahepatic bile duct dilatation, likely benign in nature and commensurate to the previous cholecystectomy. No bile duct stone seen. No obstructing mass seen. Could consider correlation with liver function tests. 5. Colonic diverticulosis without evidence of acute diverticulitis. 6. Fairly large amount of stool throughout the nondistended colon (constipation? ). No evidence of bowel obstruction or focal bowel wall inflammation. 7. Hiatal hernia, moderate to large in size, grossly stable compared to previous exams. 8. Other chronic/incidental findings detailed above. Electronically Signed   By: Franki Cabot M.D.   On: 01/31/2015 15:28    2-D echo LV EF: 25% -  30%  ------------------------------------------------------------------- Indications:   Atrial flutter 427.32.  ------------------------------------------------------------------- History:  PMH: Former Smoker, Tachycardia. Atrial flutter.  ------------------------------------------------------------------- Study Conclusions  - Left ventricle: The cavity size was normal. There was moderate concentric hypertrophy. Systolic function was severely reduced. The estimated ejection fraction was in the range of 25% to 30%. There is akinesis of the mid-apicalanteroseptal myocardium. Doppler parameters are consistent with a reversible restrictive pattern, indicative of decreased left ventricular diastolic compliance and/or increased left atrial pressure (grade 3 diastolic dysfunction).   Filed Weights   01/31/15 1117  Weight: 55.792 kg (123 lb)     Microbiology: Recent Results (from the past 240 hour(s))  Urine culture     Status: None   Collection Time: 01/31/15  9:42 AM  Result Value Ref Range Status   Colony Count NO GROWTH  Final   Organism ID, Bacteria NO GROWTH  Final  Urine culture     Status: None   Collection Time: 01/31/15 11:25 AM  Result Value Ref Range Status   Specimen Description URINE, RANDOM  Final   Special Requests NONE  Final   Culture 30,000 COLONIES/mL VIRIDANS STREPTOCOCCUS  Final   Report Status 02/03/2015 FINAL  Final       Blood Culture    Component Value Date/Time   SDES URINE, RANDOM 01/31/2015 1125   SPECREQUEST NONE 01/31/2015 1125   CULT 30,000 COLONIES/mL VIRIDANS STREPTOCOCCUS 01/31/2015 1125   REPTSTATUS 02/03/2015 FINAL 01/31/2015 1125      Labs: Results for orders placed or performed during the hospital encounter of 01/31/15 (from the past 48 hour(s))  CBC     Status: Abnormal   Collection Time: 02/05/15  2:46 AM  Result Value Ref Range   WBC 7.9  4.0 - 10.5 K/uL   RBC 3.52 (L) 3.87 - 5.11 MIL/uL   Hemoglobin 10.6 (L) 12.0 - 15.0 g/dL   HCT 33.3 (L) 36.0 - 46.0 %   MCV 94.6 78.0 - 100.0 fL   MCH 30.1 26.0 - 34.0 pg   MCHC 31.8 30.0 - 36.0 g/dL   RDW 14.1 11.5 - 15.5 %   Platelets 176 150 - 400 K/uL  Basic metabolic panel     Status: Abnormal   Collection Time: 02/05/15  2:46 AM  Result Value Ref Range   Sodium 140 135 - 145 mmol/L   Potassium 3.0 (L) 3.5 - 5.1 mmol/L   Chloride 107 101 - 111 mmol/L   CO2 26 22 - 32 mmol/L   Glucose, Bld 89 65 - 99 mg/dL   BUN 6 6 - 20 mg/dL   Creatinine, Ser 0.73 0.44 - 1.00 mg/dL   Calcium 8.0 (L) 8.9 - 10.3 mg/dL   GFR calc non Af Amer >60 >60 mL/min   GFR calc Af Amer >60 >60 mL/min    Comment: (NOTE) The eGFR has been calculated using the CKD EPI equation. This calculation has not been validated in all clinical situations. eGFR's persistently <60 mL/min signify possible Chronic Kidney Disease.    Anion gap 7 5 - 15  Magnesium     Status: None   Collection Time: 02/05/15  2:46 AM  Result Value Ref Range   Magnesium 1.9 1.7 - 2.4 mg/dL  Comprehensive metabolic panel     Status: Abnormal   Collection Time: 02/06/15  5:20 AM  Result Value Ref Range   Sodium 141 135 - 145 mmol/L   Potassium 4.2 3.5 - 5.1 mmol/L    Comment: DELTA CHECK NOTED   Chloride 109 101 - 111 mmol/L   CO2 25 22 - 32 mmol/L   Glucose, Bld 91 65 - 99 mg/dL   BUN 7 6 - 20 mg/dL   Creatinine, Ser 0.69 0.44 - 1.00 mg/dL   Calcium 8.6 (L) 8.9 - 10.3 mg/dL   Total Protein 5.6 (L) 6.5 - 8.1 g/dL   Albumin 3.0 (L) 3.5 - 5.0 g/dL   AST 15 15 - 41 U/L   ALT 13 (L) 14 - 54 U/L   Alkaline Phosphatase 47 38 - 126 U/L   Total Bilirubin 0.7 0.3 - 1.2 mg/dL   GFR calc non Af Amer >60 >60 mL/min   GFR calc Af Amer >60 >60 mL/min    Comment: (NOTE) The  eGFR has been calculated using the CKD EPI equation. This calculation has not been validated in all clinical situations. eGFR's persistently <60 mL/min signify possible  Chronic Kidney Disease.    Anion gap 7 5 - 15  CBC     Status: Abnormal   Collection Time: 02/06/15  5:20 AM  Result Value Ref Range   WBC 7.5 4.0 - 10.5 K/uL   RBC 3.77 (L) 3.87 - 5.11 MIL/uL   Hemoglobin 11.6 (L) 12.0 - 15.0 g/dL   HCT 35.3 (L) 36.0 - 46.0 %   MCV 93.6 78.0 - 100.0 fL   MCH 30.8 26.0 - 34.0 pg   MCHC 32.9 30.0 - 36.0 g/dL   RDW 14.0 11.5 - 15.5 %   Platelets 185 150 - 400 K/uL     Lipid Panel     Component Value Date/Time   CHOL 153 12/10/2013 1222   TRIG 53.0 12/10/2013 1222   HDL 67.00 12/10/2013 1222   CHOLHDL 2 12/10/2013 1222   VLDL 10.6 12/10/2013 1222   LDLCALC 75 12/10/2013 1222   LDLDIRECT 158.1 05/29/2008 1137     No results found for: HGBA1C   Lab Results  Component Value Date   LDLCALC 75 12/10/2013   CREATININE 0.69 02/06/2015     HPI :81 y.o. female who was recently treated for a UTI. Patient return to urgent care today for follow-up. Told urinalysis was normal but patient found to be tachycardic. She was advised to come to the ED. Patient denies chest pains or shortness of breath. No dizziness Patient is physically active and lives independently. Per daughter at bedside, patient cleans the house on a daily basis. Patient states her appetite at home has been fine, no unexpected weight loss. No fevers or chills.  EKG in the hospital showed atrial flutter 2:1 block, cardiology was consulted and plan was to perform TEE cardioversion. Cardioversion could not be performed because of poor image quality making it impossible to exclude thrombi. Patient was started on a noted on IV amiodarone infusion which has been now changed to by mouth amiodarone. Patient also started on anticoagulation with Eliquis.  HOSPITAL COURSE:    Atrial flutter - EKG shows atrial flutter with 2:1 block , cardiology has seen the patient and started on Eliquis 2.5 mg twice a day, amiodarone 200 mg twice a day, spironolactone 12.5 mg per day, metoprolol tartrate 25  mg twice a day, patient underwent TEE cardioversion, Unfortunately TEE cardioversion was not possible because the left atrial appendage could not be adequately visualized. . Iv amiodarone has been changed to by mouth amiodarone 200 mg twice a day for 3 weeks then 200 mg by mouth daily.follow-up in 2 weeks and elective cardioversion in 4 weeks   2. ? Liver and spleen metastasis - CT scan of the abdomen showed multiple masses in the spleen which are now larger than previous CT scan. Also shows a mass in the left lobe of the liver. All the tumor markers obtained in the hospital are negative. discussed with oncology Dr Lindi Adie  , who will see the patient in his clinic next week to discuss further testing. Patient has had these lesions seen on the CT scan of the abdomen since 2010. Daughter requested to be seen by Dr Inda Merlin ,appt was set up.Suspect patient will need cardiology clearance before bx, invasive work up, also on eliquis    3. Left upper quadrant pain- patient has multiple hypodense masses in the spleen, likely pain from stretching of the  splenic capsule. Continue Percocet when necessary for pain.   4. Elevated d-dimer - patient has elevated d-dimer, CT angiogram was negative for pulmonary embolism and lower extremity venous duplex was negative for DVT. Patient has been complaining of intermittent swelling of the left thigh for quite some time.   5. DVT prophylaxis-patient on anticoagulation with Eliquis.   6. Hypokalemia , potassium repleted    Discharge Exam:    Blood pressure 146/96, pulse 109, temperature 98.3 F (36.8 C), temperature source Oral, resp. rate 17, height 4' 10"  (1.473 m), weight 55.792 kg (123 lb), SpO2 97 %.   General: appears in no acute distress  Cardiovascular: S1-S2 irregular  Respiratory: clear to auscultation bilaterally   Abdomen: Soft, nontender, no organomegaly   Musculoskeletal: no cyanosis/clubbing/edema of lower  extremities      Follow-up Information    Follow up with [facility] R, NP On 02/25/2015.   Specialties:  Cardiology, Radiology   Why:  2:00 PM   Contact information:   [place] [address] [phone]       Follow up with Eilleen Kempf., MD.   Specialty:  Oncology   Why:  left message call pt   Contact information:   [place] [address] [phone]       Follow up with [place].   Why:  Physical Therapy   Contact information:   [place] [address] [phone]       Follow up with Sinclair Grooms, MD. Schedule an appointment as soon as possible for a visit in 2 weeks.   Specialty:  Cardiology   Why:  7-10 days   Contact information:   [address] [address] [phone]       Signed: Reyne Dumas 02/06/2015, 1:19 PM        Time spent >45 mins

## 2015-02-06 NOTE — Care Management Note (Addendum)
Case Management Note  Patient Details  Name: Cathy [name] MRN: [id number] Date of Birth: 1927-09-05  Subjective/Objective:      Pt admitted with tachycardia              Action/Plan:  Pt is independent from home   Expected Discharge Date:  01/31/15               Expected Discharge Plan:  Home/Self Care (Pt is independent from home with strong family support close by.  Pt uses cane when out in the community)  In-House Referral:     Discharge planning Services  CM Consult  Post Acute Care Choice:    Choice offered to:     DME Arranged:    DME Agency:     HH Arranged:   PT, OT, Aide, RN [place] Agency:   [facility]  Status of Service:  Complete, will sign off  Medicare Important Message Given:  Yes Date Medicare IM Given:    Medicare IM give by:    Date Additional Medicare IM Given:    Additional Medicare Important Message give by:     If discussed at [place] of Stay Meetings, dates discussed:    Additional Comments: 02/06/2015  Pt is agreeable to additional [facility] ordered, daughter at bedside and encourage pt to utilize resources.  02/05/15 CM reassessed pt; pt agreed to [place], post offering choice pt chose [facility], agency contacted and referral accepted.  CM submitted benefit check for Eliquis: Insurance check completed on Eliquis-  Per rep at [employer]:   Eliquis:  30 day retail: $229.95/ cannot tell if Josem Kaufmann is required  90 day mail order: $679.85/ cannot tell if Josem Kaufmann is required   CM to f/u with pt and provide 30 day free card         CM provided free 30 day card for Eliquis, CM also informed pt of copay post initial 30 day inventory.  Pt daughter-in-law [name]  at bedside and stated family would ensure that pt can pay copay.     02/03/15 :CM assessed pt.  Pt does not wish to participate in HHPT at this time, states she wants to wait and see how she does.  Pt is scheduled for TEE/Cardioversion today, CM will reassess post procedure. [name], RN 02/06/2015, 1:39  PM

## 2015-02-06 NOTE — Progress Notes (Signed)
       Patient Name: Cathy [name] Date of Encounter: 02/06/2015    SUBJECTIVE: She is anxious to go home. She is frail and had difficulty with walking yesterday. She denies dyspnea.  TELEMETRY:  Atrial flutter with rapid ventricular response averaging a rate of 115 bpm. Filed Vitals:   02/05/15 1332 02/05/15 1944 02/05/15 2222 02/06/15 0537  BP: 134/79 127/83 125/90 135/93  Pulse: 88 108 106 105  Temp: 97.7 F (36.5 C) 98.1 F (36.7 C)  98.3 F (36.8 C)  TempSrc: Oral Oral  Oral  Resp: 18 18  17   Height:      Weight:      SpO2: 99% 94%  97%    Intake/Output Summary (Last 24 hours) at 02/06/15 1049 Last data filed at 02/06/15 0900  Gross per 24 hour  Intake    480 ml  Output      0 ml  Net    480 ml   LABS: Basic Metabolic Panel:  Recent Labs  02/05/15 0246 02/06/15 0520  NA 140 141  K 3.0* 4.2  CL 107 109  CO2 26 25  GLUCOSE 89 91  BUN 6 7  CREATININE 0.73 0.69  CALCIUM 8.0* 8.6*  MG 1.9  --    CBC:  Recent Labs  02/05/15 0246 02/06/15 0520  WBC 7.9 7.5  HGB 10.6* 11.6*  HCT 33.3* 35.3*  MCV 94.6 93.6  PLT 176 185    Radiology/Studies:  No new data  Physical Exam: Blood pressure 135/93, pulse 105, temperature 98.3 F (36.8 C), temperature source Oral, resp. rate 17, height 4\' 10"  (1.473 m), weight 123 lb (55.792 kg), SpO2 97 %. Weight change:   Wt Readings from Last 3 Encounters:  01/31/15 123 lb (55.792 kg)  01/31/15 123 lb (55.792 kg)  01/16/15 122 lb 12.8 oz (55.702 kg)    Rapid heart rate around 100 bpm Clear lungs Trace peripheral edema  ASSESSMENT:  1. Atrial flutter still with only modest rate control. A rate control strategy is not possible given this underlying arrhythmia. 2. Systolic heart failure, acute, and felt to be related to tachycardia. No overt evidence of volume overload. 3. Anticoagulation without bleeding complications 4. Elderly and frail  Plan:   Long discussion with daughter this morning concerning  independent living. This concerns me. I recommended frequent family visits and phone checks.  She will need to have a home health nurse and home health PT  Electrical cardioversion in 3-4 weeks.  Cardiology follow-up in 10-14 days.  Demetrios Isaacs 02/06/2015, 10:49 AM

## 2015-02-06 NOTE — Evaluation (Signed)
Physical Therapy Evaluation Patient Details Name: Cathy [name] MRN: [id number] DOB: [birth year] Today's Date: 02/06/2015   History of Present Illness  Cathy [name] is a 81 y.o. female who was recently treated for a UTI. Patient return to urgent care today for follow-up. Told urinalysis was normal but patient found to be tachycardic. She was advised to come to the ED. Patient denies chest pains or shortness of breath. No dizziness Patient is physically active and lives independently. Per daughter at bedside, patient cleans the house on a daily basis. Patient states her appetite at home has been fine, no unexpected weight loss. No fevers or chills. She has chronic constipation  Clinical Impression  Progressing well.  Should be safe at home alone until checked on by family, but is considering a wearable call system for her daughter's peace of mind.    Follow Up Recommendations Home health PT;Supervision - Intermittent    Equipment Recommendations  None recommended by PT    Recommendations for Other Services       Precautions / Restrictions Precautions Precautions: Fall (minimal risk)      Mobility  Bed Mobility Overal bed mobility: Independent                Transfers Overall transfer level: Modified independent Equipment used: Rolling walker (2 wheeled)                Ambulation/Gait Ambulation/Gait assistance: Supervision Ambulation Distance (Feet): 170 Feet Assistive device: Rolling walker (2 wheeled) Gait Pattern/deviations: Step-through pattern Gait velocity: slower Gait velocity interpretation: at or above normal speed for age/gender General Gait Details: generally steady with her RW  Stairs            Wheelchair Mobility    Modified Rankin (Stroke Patients Only)       Balance Overall balance assessment: Needs assistance   Sitting balance-Leahy Scale: Normal     Standing balance support: No upper extremity supported Standing  balance-Leahy Scale: Fair                               Pertinent Vitals/Pain Pain Assessment: Faces Faces Pain Scale: Hurts little more Pain Location: foot pain Pain Descriptors / Indicators: Aching;Sore;Grimacing Pain Intervention(s): Monitored during session    Home Living                        Prior Function                 Hand Dominance        Extremity/Trunk Assessment                         Communication      Cognition Arousal/Alertness: Awake/alert Behavior During Therapy: WFL for tasks assessed/performed Overall Cognitive Status: Within Functional Limits for tasks assessed                      General Comments General comments (skin integrity, edema, etc.): EHR in the 100's during gait today.    Exercises        Assessment/Plan    PT Assessment    PT Diagnosis     PT Problem List    PT Treatment Interventions     PT Goals (Current goals can be found in the Care Plan section) Acute Rehab PT Goals Patient Stated Goal: to go home PT Goal Formulation:  With patient Time For Goal Achievement: 02/15/15 Potential to Achieve Goals: Good    Frequency Min 3X/week   Barriers to discharge        Co-evaluation               End of Session   Activity Tolerance: Patient tolerated treatment well;Patient limited by fatigue Patient left: in bed;with call bell/phone within reach Nurse Communication: Mobility status         Time: 1205-1222 PT Time Calculation (min) (ACUTE ONLY): 17 min   Charges:     PT Treatments $Gait Training: 8-22 mins   PT G Codes:        Thorin Starner, Tessie Fass 02/06/2015, 12:30 PM  02/06/2015  Donnella Sham, PT [phone]  (pager)

## 2015-02-06 NOTE — Progress Notes (Signed)
Pt. Discharged to home escorted bydaughter Pt. D/C'd via  Wheelchair with guest services Discharge information reviewed and given All personal belongings given to Pt.  Education discussed IV's was d/c intact upon removal Tele d/c  Katrina Gunter 2:20 PM

## 2015-02-07 ENCOUNTER — Telehealth: Payer: Self-pay | Admitting: Internal Medicine

## 2015-02-07 NOTE — Telephone Encounter (Signed)
new patient appt-s/w patient dtr Dalbert Mayotte and gave np appt for 12/20 @ 1:45 w/Dr. Julien Nordmann.  Per md first available.

## 2015-02-10 ENCOUNTER — Telehealth: Payer: Self-pay | Admitting: Interventional Cardiology

## 2015-02-10 NOTE — Telephone Encounter (Signed)
New Message  Pt c/o medication issue:  1. Name of Medication: Amiodarone and Simvastatin   4. What is your medication issue? Pt is taking both medications. Pt is at high risk for rhabdomyolysis, because these medications are not supposed to be taken together. Please call [facility] nurse back to discuss.

## 2015-02-10 NOTE — Telephone Encounter (Signed)
Routed to Dr.Smith to advise 

## 2015-02-11 NOTE — Telephone Encounter (Signed)
Follow up      2 things: Pt is taking atorvastatin and simvastatin.  Which is she supposed to be taking? And, there is a drug interaction between amiodarone and simvastatin.

## 2015-02-11 NOTE — Telephone Encounter (Signed)
Returned call to [facility] pt Orthoptist. lmtcb  Call and spoke with pt adv her per Dr.Smith STOP Simvastatin. She was given an Rx for Atorvastatin after d/c from the hospital. Adv pt to continue Atorvastatin 20mg  qd. Pt repeated instructions back to me, and verbalized understanding.

## 2015-02-11 NOTE — Telephone Encounter (Signed)
Eustaquio Maize, RN pt home health nurse aware of message below

## 2015-02-18 ENCOUNTER — Encounter: Payer: Self-pay | Admitting: Internal Medicine

## 2015-02-18 ENCOUNTER — Telehealth: Payer: Self-pay | Admitting: Internal Medicine

## 2015-02-18 ENCOUNTER — Ambulatory Visit (HOSPITAL_BASED_OUTPATIENT_CLINIC_OR_DEPARTMENT_OTHER): Payer: Medicare Other | Admitting: Internal Medicine

## 2015-02-18 VITALS — BP 134/58 | HR 57 | Temp 98.8°F | Resp 17 | Ht <= 58 in | Wt 124.5 lb

## 2015-02-18 DIAGNOSIS — K769 Liver disease, unspecified: Secondary | ICD-10-CM

## 2015-02-18 DIAGNOSIS — E279 Disorder of adrenal gland, unspecified: Secondary | ICD-10-CM

## 2015-02-18 DIAGNOSIS — D739 Disease of spleen, unspecified: Secondary | ICD-10-CM | POA: Diagnosis not present

## 2015-02-18 DIAGNOSIS — Z800 Family history of malignant neoplasm of digestive organs: Secondary | ICD-10-CM

## 2015-02-18 DIAGNOSIS — D7389 Other diseases of spleen: Secondary | ICD-10-CM | POA: Insufficient documentation

## 2015-02-18 DIAGNOSIS — R935 Abnormal findings on diagnostic imaging of other abdominal regions, including retroperitoneum: Secondary | ICD-10-CM

## 2015-02-18 DIAGNOSIS — Z801 Family history of malignant neoplasm of trachea, bronchus and lung: Secondary | ICD-10-CM

## 2015-02-18 DIAGNOSIS — K7689 Other specified diseases of liver: Secondary | ICD-10-CM | POA: Diagnosis not present

## 2015-02-18 DIAGNOSIS — R5382 Chronic fatigue, unspecified: Secondary | ICD-10-CM

## 2015-02-18 DIAGNOSIS — R5383 Other fatigue: Secondary | ICD-10-CM

## 2015-02-18 HISTORY — DX: Liver disease, unspecified: K76.9

## 2015-02-18 HISTORY — DX: Other diseases of spleen: D73.89

## 2015-02-18 NOTE — Telephone Encounter (Signed)
per pof to sch pt appt-adv pt [facility] will call to [facility] scan-gave pt copy of avs °

## 2015-02-18 NOTE — Progress Notes (Signed)
[place] [phone]   [phone]  CONSULT NOTE  REFERRING PHYSICIAN: Dr. Reyne Dumas  REASON FOR CONSULTATION:  81 years old white female with liver and splenic lesions questionable for lymphoma.  HPI Cathy [name] is a 81 y.o. female with past medical history significant for multiple medical problems including history of dyslipidemia, anxiety, irritable bowel syndrome, C. difficile colitis, degenerative joint disease, urinary incontinence, congestive heart failure, coronary artery disease as well as anxiety. The patient was admitted to [facility] complaining of tachycardia and she was treated 2 weeks before for urinary tract infection. She was treated for atrial flutter at that time. During her evaluation she had CT scan of the abdomen and pelvis performed that showed numerous hypodense masses within the spleen, increased significantly in number compared to previous studies, and largest lesions have increased in size. Largest lesion within the posterior aspect of the spleen measures 2.8 x 2.7 cm (2.1 cm greatest dimension on the previous exam, and approximately 1.5 cm on the earlier exam of 08/29/2008). Next largest lesion within the anterior spleen measures 2.7 x 2.3 cm (measured at 2.4 cm greatest dimension on the previous exam). The increase in size and number suggests neoplastic process. There is a new enhancing lesion within the inferior left hepatic lobe, likely segment 4B, measuring 2.7 x 1.5 cm, worrisome for metastasis. There was also mildly increased intrahepatic and extrahepatic bile duct dilatation likely benign in nature. There was also stable to slightly smaller left adrenal mass and this is still suspicious for neoplastic process. The patient was referred to me today for further evaluation and recommendation regarding these abnormality on her CT scan of the abdomen and pelvis. The CT angiogram of the chest performed on 02/01/2015  was negative for pulmonary embolism and it showed small pleural effusions and bibasilar atelectasis as well as large hiatal hernia. When seen today the patient complains of lack of energy and fatigue. She denied having any significant chest pain, shortness breath, cough or hemoptysis. She denied having any nausea, vomiting, abdominal pain, diarrhea or constipation. She has no significant weight loss or night sweats. She has no headache or visual changes. Family history significant for mother with heart failure, father had prostate cancer, sister and daughter with lung cancer and brother with colon cancer. The patient is a widow and has 2 children. She used to work at [employer]. She has no history of smoking, alcohol or drug abuse.  HPI  Past Medical History  Diagnosis Date  . Hypertension   . Venous insufficiency   . Hypercholesteremia   . Hiatal hernia   . Dysphagia   . Diverticulosis of colon   . Irritable bowel syndrome   . Colitis     lymphocytic colitis feb 2011  . Urinary incontinence   . DJD (degenerative joint disease)   . Lumbar back pain   . Osteoporosis   . Anxiety   . Anemia   . Iron deficiency   . Vitamin B12 deficiency   . Allergic rhinitis   . Splenic lesion 02/18/2015  . Liver lesion 02/18/2015    Past Surgical History  Procedure Laterality Date  . Cataract surgery    . Abdominal hysterectomy    . Decompressive lumbar laminectomy  06/2008    at L2-3, L3-4 and L4-5 by Dr. Ronnald Ramp  . Laparoscopic cholecystectomy  04/2009    Dr. Abran Cantor  . Replacement total knee    . Tee without cardioversion N/A 02/03/2015  Procedure: TRANSESOPHAGEAL ECHOCARDIOGRAM (TEE);  Surgeon: Sueanne Margarita, MD;  Location: [facility];  Service: Cardiovascular;  Laterality: N/A;  . Cardioversion N/A 02/03/2015    Procedure: CARDIOVERSION;  Surgeon: Sueanne Margarita, MD;  Location: [facility];  Service: Cardiovascular;  Laterality: N/A;    History reviewed. No pertinent family  history.  Social History Social History  Substance Use Topics  . Smoking status: Former Smoker -- 0.50 packs/day for 10 years    Types: Cigarettes  . Smokeless tobacco: Never Used  . Alcohol Use: No    Allergies  Allergen Reactions  . Amoxicillin-Pot Clavulanate     REACTION: diarrhea  . Sulfonamide Derivatives     REACTION: unsure of reaction    Current Outpatient Prescriptions  Medication Sig Dispense Refill  . amiodarone (PACERONE) 200 MG tablet Take 1 tablet (200 mg total) by mouth 2 (two) times daily. 60 tablet 0  . apixaban (ELIQUIS) 2.5 MG TABS tablet Take 1 tablet (2.5 mg total) by mouth 2 (two) times daily. 60 tablet 0  . atorvastatin (LIPITOR) 20 MG tablet Take 1 tablet (20 mg total) by mouth daily at 6 PM. 30 tablet 0  . calcium-vitamin D (OSCAL WITH D) 500-200 MG-UNIT per tablet Take 1 tablet by mouth daily.     . cholecalciferol (VITAMIN D) 1000 UNITS tablet Take 1,000 Units by mouth daily.    . diclofenac (FLECTOR) 1.3 % PTCH Place 1 patch onto the skin every 12 (twelve) hours. Apply as directed by Dr. Brien Few    . docusate sodium (COLACE) 100 MG capsule Take 1 capsule (100 mg total) by mouth every 12 (twelve) hours. 60 capsule 0  . esomeprazole (NEXIUM) 40 MG capsule Take 1 capsule (40 mg total) by mouth daily. 90 capsule 3  . Ferrous Sulfate Dried (FEOSOL) 200 (65 FE) MG TABS Take 1 tablet by mouth 2 (two) times daily. Take with a vitamin c 500mg      . fexofenadine (ALLEGRA) 180 MG tablet Take 180 mg by mouth daily as needed for allergies or rhinitis.    . hydrochlorothiazide (MICROZIDE) 12.5 MG capsule   0  . metoprolol tartrate (LOPRESSOR) 25 MG tablet Take 1 tablet (25 mg total) by mouth 2 (two) times daily. 60 tablet 0  . oxyCODONE-acetaminophen (PERCOCET) 5-325 MG per tablet Take 1 tablet by mouth every 6 (six) hours as needed for moderate pain.     . polyethylene glycol (MIRALAX / GLYCOLAX) packet Take 17 g by mouth 2 (two) times daily. 28 each 0  . potassium  chloride (K-DUR,KLOR-CON) 10 MEQ tablet Take 1 tablet (10 mEq total) by mouth daily. 30 tablet 0  . pregabalin (LYRICA) 50 MG capsule Take 1 capsule (50 mg total) by mouth 3 (three) times daily. 270 capsule 1  . PROLIA 60 MG/ML SOLN injection Inject 60 mg into the skin every 6 (six) months.     . solifenacin (VESICARE) 10 MG tablet Take 1 tablet (10 mg total) by mouth daily. 90 tablet 1  . spironolactone (ALDACTONE) 25 MG tablet Take 0.5 tablets (12.5 mg total) by mouth daily. 30 tablet 1  . vitamin B-12 (CYANOCOBALAMIN) 1000 MCG tablet Take 1 tablet (1,000 mcg total) by mouth daily.     No current facility-administered medications for this visit.    Review of Systems  Constitutional: positive for fatigue Eyes: negative Ears, nose, mouth, throat, and face: negative Respiratory: negative Cardiovascular: negative Gastrointestinal: negative Genitourinary:negative Integument/breast: negative Hematologic/lymphatic: negative Musculoskeletal:positive for muscle weakness Neurological: negative Behavioral/Psych: negative Endocrine:  negative Allergic/Immunologic: negative  Physical Exam  [id number], healthy, no distress, well nourished and well developed SKIN: skin color, texture, turgor are normal, no rashes or significant lesions HEAD: Normocephalic, No masses, lesions, tenderness or abnormalities EYES: normal, PERRLA, Conjunctiva are pink and non-injected EARS: External ears normal, Canals clear OROPHARYNX:no exudate, no erythema and lips, buccal mucosa, and tongue normal  NECK: supple, no adenopathy, no JVD LYMPH:  no palpable lymphadenopathy, no hepatosplenomegaly BREAST:not examined LUNGS: clear to auscultation , and palpation HEART: regular rate & rhythm and no murmurs ABDOMEN:abdomen soft, non-tender, normal bowel sounds and no masses or organomegaly BACK: Back symmetric, no curvature., No CVA tenderness EXTREMITIES:no joint deformities, effusion, or inflammation, no edema, no  skin discoloration  NEURO: alert & oriented x 3 with fluent speech, no focal motor/sensory deficits  PERFORMANCE STATUS: ECOG 2  LABORATORY DATA: Lab Results  Component Value Date   WBC 7.5 02/06/2015   HGB 11.6* 02/06/2015   HCT 35.3* 02/06/2015   MCV 93.6 02/06/2015   PLT 185 02/06/2015      Chemistry      Component Value Date/Time   NA 141 02/06/2015 0520   K 4.2 02/06/2015 0520   CL 109 02/06/2015 0520   CO2 25 02/06/2015 0520   BUN 7 02/06/2015 0520   CREATININE 0.69 02/06/2015 0520   CREATININE 0.75 01/16/2015 2037      Component Value Date/Time   CALCIUM 8.6* 02/06/2015 0520   ALKPHOS 47 02/06/2015 0520   AST 15 02/06/2015 0520   ALT 13* 02/06/2015 0520   BILITOT 0.7 02/06/2015 0520       RADIOGRAPHIC STUDIES: Ct Angio Chest Pe W/cm &/or Wo Cm  02/01/2015  CLINICAL DATA:  Tachycardia. EXAM: CT ANGIOGRAPHY CHEST WITH CONTRAST TECHNIQUE: Multidetector CT imaging of the chest was performed using the standard protocol during bolus administration of intravenous contrast. Multiplanar CT image reconstructions and MIPs were obtained to evaluate the vascular anatomy. CONTRAST:  21mL OMNIPAQUE IOHEXOL 350 MG/ML SOLN COMPARISON:  None FINDINGS: THORACIC INLET/BODY WALL: No acute abnormality. MEDIASTINUM: Cardiomegaly with particularly notable right atrial enlargement. There is mitral annular ossification. Atherosclerosis, including the coronary arteries. Limited opacification of the aorta without evidence of acute aortic disease. No pericardial effusion. Large main pulmonary arteries, with the right branch measuring 28 mm. This could reflect pulmonary hypertension. No evidence of pulmonary embolism. Large hiatal hernia. LUNG WINDOWS: Small pleural effusions with basilar atelectasis. No indication of pneumonia or edema. UPPER ABDOMEN: No acute findings. Low-density 3 cm mass in the left adrenal gland is stable from 2010 myelogram and consistent with adenoma. OSSEOUS: Severe right  glenohumeral arthropathy with joint effusion and degenerative cyst in the subscapular fossa. Severe and diffuse thoracic disc degeneration. No acute osseous finding. Review of the MIP images confirms the above findings. IMPRESSION: 1. Negative for pulmonary embolism. 2. Small pleural effusions and bibasilar atelectasis. 3. Large hiatal hernia. Electronically Signed   By: Monte Fantasia M.D.   On: 02/01/2015 11:31   Ct Abdomen Pelvis W Contrast  02/15/2015  ADDENDUM REPORT: 02/15/2015 16:31 ADDENDUM: The amount of contrast actually given for this exam: 100 mL OMNIPAQUE IOHEXOL 300 MG/ML SOLN Electronically Signed   By: Franki Cabot M.D.   On: 02/15/2015 16:31  02/15/2015  CLINICAL DATA:  Tachycardia, lower abdominal pain. Recently treated for urinary tract infection. History of colitis, diverticulosis and hiatal hernia. EXAM: CT ABDOMEN AND PELVIS WITH CONTRAST TECHNIQUE: Multidetector CT imaging of the abdomen and pelvis was performed using the standard protocol following bolus  administration of intravenous contrast. CONTRAST:  539mL OMNIPAQUE IOHEXOL 300 MG/ML  SOLN COMPARISON:  Previous exams including CT abdomen and pelvis dated 04/19/2009 and 08/29/2008. FINDINGS: Lower chest: There is mild bibasilar scarring/atelectasis and small bilateral pleural effusions. Moderate to large hiatal hernia is unchanged. Hepatobiliary: There is slightly increased intrahepatic and extrahepatic bile duct dilatation, common bile duct now dilated to approximately 14 mm (previously measured at 12.5 mm). No obstructing bile duct stone or bile duct mass identified. There is a new enhancing lesion within the inferior left hepatic lobe, likely segment 4B, measuring 2.7 x 1.5 cm, worrisome for metastasis. No other enhancing mass or lesion identified within the liver. Patient is status post cholecystectomy. Pancreas: No mass, inflammatory changes, or other significant abnormality. No significant pancreatic duct dilatation. Spleen:  There are numerous hypodense masses within the spleen, increased significantly in number compared to previous studies, and largest lesions have increased in size. Largest lesion within the posterior aspect of the spleen measures 2.8 x 2.7 cm (2.1 cm greatest dimension on the previous exam, and approximately 1.5 cm on the earlier exam of 08/29/2008). Next largest lesion within the anterior spleen measures 2.7 x 2.3 cm (measured at 2.4 cm greatest dimension on the previous exam). The increase in size and number suggests neoplastic process. Adrenals/Urinary Tract: Left adrenal mass is slightly decreased in prominence measuring 3.2 x 2.1 cm on today's exam (previously measured at 3.4 x 2.5 cm). Right adrenal gland is normal. Both kidneys are unremarkable without stone or hydronephrosis. No ureteral or bladder calculi identified. Bladder is unremarkable. Stomach/Bowel: Bowel is normal in caliber. Scattered diverticulosis noted within the descending and sigmoid colon without evidence of acute diverticulitis. Bowel is difficult to definitively characterize without oral contrast but there is no obvious evidence of bowel wall thickening or bowel wall inflammation. Appendix is not seen but there are no inflammatory changes about the cecum to suggest acute appendicitis. Vascular/Lymphatic: Scattered atherosclerotic changes again noted along the walls of the ectatic but normal-caliber abdominal aorta. No acute vascular abnormality seen. No enlarged lymph nodes identified in the abdomen or pelvis. Reproductive: No mass or other significant abnormality. Other: No free fluid or abscess collections seen. No free intraperitoneal air. Heart is at least mildly enlarged, incompletely imaged at the upper portion of this exam. Musculoskeletal: Degenerative changes are seen throughout the slightly scoliotic thoracolumbar spine but no acute osseous abnormality. No evidence of osseous metastasis seen. Evidence of a previous surgical  decompression of the central canal noted in the lower lumbar spine. IMPRESSION: 1. Increased number and size of the numerous splenic masses indicating neoplastic process, possibly lymphoma or metastasis as suggested on the previous exam. 2. Stable, or slightly smaller, left adrenal mass. This also remains suspicious for neoplastic process. 3. New enhancing lesion within the left hepatic lobe, segment 4B, measuring 2.8 x 1.5 cm. This is also suspicious for metastatic disease. 4. Mildly increased intrahepatic and extrahepatic bile duct dilatation, likely benign in nature and commensurate to the previous cholecystectomy. No bile duct stone seen. No obstructing mass seen. Could consider correlation with liver function tests. 5. Colonic diverticulosis without evidence of acute diverticulitis. 6. Fairly large amount of stool throughout the nondistended colon (constipation? ). No evidence of bowel obstruction or focal bowel wall inflammation. 7. Hiatal hernia, moderate to large in size, grossly stable compared to previous exams. 8. Other chronic/incidental findings detailed above. Electronically Signed: By: Franki Cabot M.D. On: 01/31/2015 15:28    ASSESSMENT: This is a very pleasant 81 years  old white female with suspicious splenic lesions that has been going on for several years but increased in number and size. She also has suspicious liver and adrenal lesions. This finding could be related to underlying lymphoma but doesn't neoplastic process cannot be secluded at this point.   PLAN: I had a lengthy discussion with the patient and her family today about her current condition and further investigation to confirm her diagnosis. I recommended for the patient to have a PET scan performed for evaluation of these splenic and liver lesions. If these lesions are hypermetabolic, I may consider the patient for ultrasound guided biopsy of the liver lesion or adrenal lesion if hypermetabolic. I will see her back for  follow-up visit in 3 weeks for reevaluation and more detailed discussion of her treatment options based on the PET scan results. The patient was advised to call immediately if she has any concerning symptoms in the interval. The patient voices understanding of current disease status and treatment options and is in agreement with the current care plan.  All questions were answered. The patient knows to call the clinic with any problems, questions or concerns. We can certainly see the patient much sooner if necessary.  Thank you so much for allowing me to participate in the care of SHRIKA TANNA. I will continue to follow up the patient with you and assist in her care.  I spent 40 minutes counseling the patient face to face. The total time spent in the appointment was 60 minutes.  Disclaimer: This note was dictated with voice recognition software. Similar sounding words can inadvertently be transcribed and may not be corrected upon review.   Jujuan Dugo K. February 18, 2015, 5:10 PM

## 2015-02-20 ENCOUNTER — Other Ambulatory Visit: Payer: Self-pay | Admitting: Medical Oncology

## 2015-02-21 ENCOUNTER — Other Ambulatory Visit (HOSPITAL_BASED_OUTPATIENT_CLINIC_OR_DEPARTMENT_OTHER): Payer: Medicare Other

## 2015-02-21 ENCOUNTER — Ambulatory Visit (HOSPITAL_COMMUNITY)
Admission: RE | Admit: 2015-02-21 | Discharge: 2015-02-21 | Disposition: A | Discharge status: Home / Self Care | Payer: Medicare Other | Source: Ambulatory Visit | Attending: Internal Medicine | Admitting: Internal Medicine

## 2015-02-21 DIAGNOSIS — J90 Pleural effusion, not elsewhere classified: Secondary | ICD-10-CM | POA: Diagnosis not present

## 2015-02-21 DIAGNOSIS — K449 Diaphragmatic hernia without obstruction or gangrene: Secondary | ICD-10-CM | POA: Diagnosis not present

## 2015-02-21 DIAGNOSIS — D739 Disease of spleen, unspecified: Secondary | ICD-10-CM

## 2015-02-21 DIAGNOSIS — D7389 Other diseases of spleen: Secondary | ICD-10-CM

## 2015-02-21 DIAGNOSIS — I700 Atherosclerosis of aorta: Secondary | ICD-10-CM | POA: Diagnosis not present

## 2015-02-21 DIAGNOSIS — K7689 Other specified diseases of liver: Secondary | ICD-10-CM | POA: Insufficient documentation

## 2015-02-21 DIAGNOSIS — K769 Liver disease, unspecified: Secondary | ICD-10-CM

## 2015-02-21 DIAGNOSIS — R5382 Chronic fatigue, unspecified: Secondary | ICD-10-CM | POA: Diagnosis not present

## 2015-02-21 DIAGNOSIS — R935 Abnormal findings on diagnostic imaging of other abdominal regions, including retroperitoneum: Secondary | ICD-10-CM

## 2015-02-21 DIAGNOSIS — E279 Disorder of adrenal gland, unspecified: Secondary | ICD-10-CM | POA: Diagnosis not present

## 2015-02-21 DIAGNOSIS — I517 Cardiomegaly: Secondary | ICD-10-CM | POA: Insufficient documentation

## 2015-02-21 DIAGNOSIS — I2510 Atherosclerotic heart disease of native coronary artery without angina pectoris: Secondary | ICD-10-CM | POA: Diagnosis not present

## 2015-02-21 LAB — CBC WITH DIFFERENTIAL/PLATELET
BASO%: 0.4 % (ref 0.0–2.0)
BASOS ABS: 0 10*3/uL (ref 0.0–0.1)
EOS%: 1 % (ref 0.0–7.0)
Eosinophils Absolute: 0.1 10*3/uL (ref 0.0–0.5)
HEMATOCRIT: 37 % (ref 34.8–46.6)
HGB: 12.1 g/dL (ref 11.6–15.9)
LYMPH#: 0.9 10*3/uL (ref 0.9–3.3)
LYMPH%: 9.9 % — ABNORMAL LOW (ref 14.0–49.7)
MCH: 30.5 pg (ref 25.1–34.0)
MCHC: 32.8 g/dL (ref 31.5–36.0)
MCV: 92.9 fL (ref 79.5–101.0)
MONO#: 0.5 10*3/uL (ref 0.1–0.9)
MONO%: 5.8 % (ref 0.0–14.0)
NEUT#: 7.2 10*3/uL — ABNORMAL HIGH (ref 1.5–6.5)
NEUT%: 82.9 % — AB (ref 38.4–76.8)
PLATELETS: 173 10*3/uL (ref 145–400)
RBC: 3.98 10*6/uL (ref 3.70–5.45)
RDW: 14.5 % (ref 11.2–14.5)
WBC: 8.7 10*3/uL (ref 3.9–10.3)

## 2015-02-21 LAB — COMPREHENSIVE METABOLIC PANEL
ALT: 32 U/L (ref 0–55)
ANION GAP: 9 meq/L (ref 3–11)
AST: 24 U/L (ref 5–34)
Albumin: 3.8 g/dL (ref 3.5–5.0)
Alkaline Phosphatase: 58 U/L (ref 40–150)
BUN: 16.1 mg/dL (ref 7.0–26.0)
CALCIUM: 9.3 mg/dL (ref 8.4–10.4)
CHLORIDE: 106 meq/L (ref 98–109)
CO2: 28 mEq/L (ref 22–29)
Creatinine: 1 mg/dL (ref 0.6–1.1)
EGFR: 52 mL/min/{1.73_m2} — AB (ref >90)
Glucose: 83 mg/dl (ref 70–140)
POTASSIUM: 4.7 meq/L (ref 3.5–5.1)
Sodium: 143 mEq/L (ref 136–145)
Total Bilirubin: 0.57 mg/dL (ref 0.20–1.20)
Total Protein: 6.6 g/dL (ref 6.4–8.3)

## 2015-02-21 LAB — GLUCOSE, CAPILLARY: GLUCOSE-CAPILLARY: 79 mg/dL (ref 65–99)

## 2015-02-21 LAB — LACTATE DEHYDROGENASE: LDH: 204 U/L (ref 125–245)

## 2015-02-21 MED ORDER — FLUDEOXYGLUCOSE F - 18 (FDG) INJECTION
6.2000 | Freq: Once | INTRAVENOUS | Status: AC | PRN
  Administered 2015-02-21: 6.2 via INTRAVENOUS

## 2015-02-24 NOTE — Discharge Planning (Signed)
Physician Discharge Summary  Cathy [name] MRN: [id number] DOB/AGE: 1927/05/25 81 y.o.  PCP: Gwendolyn Grant, MD   Admit date: 01/31/2015 Discharge date: 02/24/2015  Discharge Diagnoses:     Active Problems:   Constipation   Tachycardia   Fatigue   Abnormal CT of the abdomen   Prolonged Q-T interval on ECG   Sinus tachycardia (HCC)   Atrial flutter (HCC)   Acute systolic HF (heart failure) (HCC)   CAD (coronary artery disease), native coronary artery    Follow-up recommendations Follow-up with PCP in 3-5 days , including all  additional recommended appointments as below Follow-up CBC, CMP in 3-5 days  Electrical cardioversion in 3-4 weeks.  Cardiology follow-up in 10-14 days. Oncology follow-up with Dr. Julien Nordmann     Medication List    STOP taking these medications        aspirin 81 MG tablet     ciprofloxacin 500 MG tablet  Commonly known as:  CIPRO     simvastatin 40 MG tablet  Commonly known as:  ZOCOR      TAKE these medications        amiodarone 200 MG tablet  Commonly known as:  PACERONE  Take 1 tablet (200 mg total) by mouth 2 (two) times daily.     apixaban 2.5 MG Tabs tablet  Commonly known as:  ELIQUIS  Take 1 tablet (2.5 mg total) by mouth 2 (two) times daily.     atorvastatin 20 MG tablet  Commonly known as:  LIPITOR  Take 1 tablet (20 mg total) by mouth daily at 6 PM.     calcium-vitamin D 500-200 MG-UNIT tablet  Commonly known as:  OSCAL WITH D  Take 1 tablet by mouth daily.     cholecalciferol 1000 UNITS tablet  Commonly known as:  VITAMIN D  Take 1,000 Units by mouth daily.     docusate sodium 100 MG capsule  Commonly known as:  COLACE  Take 1 capsule (100 mg total) by mouth every 12 (twelve) hours.     esomeprazole 40 MG capsule  Commonly known as:  NEXIUM  Take 1 capsule (40 mg total) by mouth daily.     FEOSOL 200 (65 FE) MG Tabs  Generic drug:  Ferrous Sulfate Dried  Take 1 tablet by mouth 2 (two) times daily. Take  with a vitamin c 500mg      fexofenadine 180 MG tablet  Commonly known as:  ALLEGRA  Take 180 mg by mouth daily as needed for allergies or rhinitis.     FLECTOR 1.3 % Ptch  Generic drug:  diclofenac  Place 1 patch onto the skin every 12 (twelve) hours. Apply as directed by Dr. Brien Few     metoprolol tartrate 25 MG tablet  Commonly known as:  LOPRESSOR  Take 1 tablet (25 mg total) by mouth 2 (two) times daily.     oxyCODONE-acetaminophen 5-325 MG tablet  Commonly known as:  PERCOCET/ROXICET  Take 1 tablet by mouth every 6 (six) hours as needed for moderate pain.     polyethylene glycol packet  Commonly known as:  MIRALAX / GLYCOLAX  Take 17 g by mouth 2 (two) times daily.     potassium chloride 10 MEQ tablet  Commonly known as:  K-DUR,KLOR-CON  Take 1 tablet (10 mEq total) by mouth daily.     pregabalin 50 MG capsule  Commonly known as:  LYRICA  Take 1 capsule (50 mg total) by mouth 3 (three) times daily.  PROLIA 60 MG/ML Soln injection  Generic drug:  denosumab  Inject 60 mg into the skin every 6 (six) months.     solifenacin 10 MG tablet  Commonly known as:  VESICARE  Take 1 tablet (10 mg total) by mouth daily.     spironolactone 25 MG tablet  Commonly known as:  ALDACTONE  Take 0.5 tablets (12.5 mg total) by mouth daily.     vitamin B-12 1000 MCG tablet  Commonly known as:  CYANOCOBALAMIN  Take 1 tablet (1,000 mcg total) by mouth daily.         Discharge Condition:    Discharge Instructions   Discharge Instructions    Diet - low sodium heart healthy    Complete by:  As directed      Increase activity slowly    Complete by:  As directed            Allergies  Allergen Reactions  . Amoxicillin-Pot Clavulanate     REACTION: diarrhea  . Sulfonamide Derivatives     REACTION: unsure of reaction      Disposition: 01-Home or Self Care   Consults: Cardiology      Significant Diagnostic Studies:  Ct Angio Chest Pe W/cm &/or Wo  Cm  02/01/2015  CLINICAL DATA:  Tachycardia. EXAM: CT ANGIOGRAPHY CHEST WITH CONTRAST TECHNIQUE: Multidetector CT imaging of the chest was performed using the standard protocol during bolus administration of intravenous contrast. Multiplanar CT image reconstructions and MIPs were obtained to evaluate the vascular anatomy. CONTRAST:  26mL OMNIPAQUE IOHEXOL 350 MG/ML SOLN COMPARISON:  None FINDINGS: THORACIC INLET/BODY WALL: No acute abnormality. MEDIASTINUM: Cardiomegaly with particularly notable right atrial enlargement. There is mitral annular ossification. Atherosclerosis, including the coronary arteries. Limited opacification of the aorta without evidence of acute aortic disease. No pericardial effusion. Large main pulmonary arteries, with the right branch measuring 28 mm. This could reflect pulmonary hypertension. No evidence of pulmonary embolism. Large hiatal hernia. LUNG WINDOWS: Small pleural effusions with basilar atelectasis. No indication of pneumonia or edema. UPPER ABDOMEN: No acute findings. Low-density 3 cm mass in the left adrenal gland is stable from 2010 myelogram and consistent with adenoma. OSSEOUS: Severe right glenohumeral arthropathy with joint effusion and degenerative cyst in the subscapular fossa. Severe and diffuse thoracic disc degeneration. No acute osseous finding. Review of the MIP images confirms the above findings. IMPRESSION: 1. Negative for pulmonary embolism. 2. Small pleural effusions and bibasilar atelectasis. 3. Large hiatal hernia. Electronically Signed   By: Monte Fantasia M.D.   On: 02/01/2015 11:31   Ct Abdomen Pelvis W Contrast  01/31/2015  CLINICAL DATA:  Tachycardia, lower abdominal pain. Recently treated for urinary tract infection. History of colitis, diverticulosis and hiatal hernia. EXAM: CT ABDOMEN AND PELVIS WITH CONTRAST TECHNIQUE: Multidetector CT imaging of the abdomen and pelvis was performed using the standard protocol following bolus administration of  intravenous contrast. CONTRAST:  521mL OMNIPAQUE IOHEXOL 300 MG/ML  SOLN COMPARISON:  Previous exams including CT abdomen and pelvis dated 04/19/2009 and 08/29/2008. FINDINGS: Lower chest: There is mild bibasilar scarring/atelectasis and small bilateral pleural effusions. Moderate to large hiatal hernia is unchanged. Hepatobiliary: There is slightly increased intrahepatic and extrahepatic bile duct dilatation, common bile duct now dilated to approximately 14 mm (previously measured at 12.5 mm). No obstructing bile duct stone or bile duct mass identified. There is a new enhancing lesion within the inferior left hepatic lobe, likely segment 4B, measuring 2.7 x 1.5 cm, worrisome for metastasis. No other enhancing mass or  lesion identified within the liver. Patient is status post cholecystectomy. Pancreas: No mass, inflammatory changes, or other significant abnormality. No significant pancreatic duct dilatation. Spleen: There are numerous hypodense masses within the spleen, increased significantly in number compared to previous studies, and largest lesions have increased in size. Largest lesion within the posterior aspect of the spleen measures 2.8 x 2.7 cm (2.1 cm greatest dimension on the previous exam, and approximately 1.5 cm on the earlier exam of 08/29/2008). Next largest lesion within the anterior spleen measures 2.7 x 2.3 cm (measured at 2.4 cm greatest dimension on the previous exam). The increase in size and number suggests neoplastic process. Adrenals/Urinary Tract: Left adrenal mass is slightly decreased in prominence measuring 3.2 x 2.1 cm on today's exam (previously measured at 3.4 x 2.5 cm). Right adrenal gland is normal. Both kidneys are unremarkable without stone or hydronephrosis. No ureteral or bladder calculi identified. Bladder is unremarkable. Stomach/Bowel: Bowel is normal in caliber. Scattered diverticulosis noted within the descending and sigmoid colon without evidence of acute diverticulitis.  Bowel is difficult to definitively characterize without oral contrast but there is no obvious evidence of bowel wall thickening or bowel wall inflammation. Appendix is not seen but there are no inflammatory changes about the cecum to suggest acute appendicitis. Vascular/Lymphatic: Scattered atherosclerotic changes again noted along the walls of the ectatic but normal-caliber abdominal aorta. No acute vascular abnormality seen. No enlarged lymph nodes identified in the abdomen or pelvis. Reproductive: No mass or other significant abnormality. Other: No free fluid or abscess collections seen. No free intraperitoneal air. Heart is at least mildly enlarged, incompletely imaged at the upper portion of this exam. Musculoskeletal: Degenerative changes are seen throughout the slightly scoliotic thoracolumbar spine but no acute osseous abnormality. No evidence of osseous metastasis seen. Evidence of a previous surgical decompression of the central canal noted in the lower lumbar spine. IMPRESSION: 1. Increased number and size of the numerous splenic masses indicating neoplastic process, possibly lymphoma or metastasis as suggested on the previous exam. 2. Stable, or slightly smaller, left adrenal mass. This also remains suspicious for neoplastic process. 3. New enhancing lesion within the left hepatic lobe, segment 4B, measuring 2.8 x 1.5 cm. This is also suspicious for metastatic disease. 4. Mildly increased intrahepatic and extrahepatic bile duct dilatation, likely benign in nature and commensurate to the previous cholecystectomy. No bile duct stone seen. No obstructing mass seen. Could consider correlation with liver function tests. 5. Colonic diverticulosis without evidence of acute diverticulitis. 6. Fairly large amount of stool throughout the nondistended colon (constipation? ). No evidence of bowel obstruction or focal bowel wall inflammation. 7. Hiatal hernia, moderate to large in size, grossly stable compared to  previous exams. 8. Other chronic/incidental findings detailed above. Electronically Signed   By: Franki Cabot M.D.   On: 01/31/2015 15:28    2-D echo LV EF: 25% -  30%  ------------------------------------------------------------------- Indications:   Atrial flutter 427.32.  ------------------------------------------------------------------- History:  PMH: Former Smoker, Tachycardia. Atrial flutter.  ------------------------------------------------------------------- Study Conclusions  - Left ventricle: The cavity size was normal. There was moderate concentric hypertrophy. Systolic function was severely reduced. The estimated ejection fraction was in the range of 25% to 30%. There is akinesis of the mid-apicalanteroseptal myocardium. Doppler parameters are consistent with a reversible restrictive pattern, indicative of decreased left ventricular diastolic compliance and/or increased left atrial pressure (grade 3 diastolic dysfunction).   Filed Weights   01/31/15 1117  Weight: 55.792 kg (123 lb)     Microbiology: No  results found for this or any previous visit (from the past 240 hour(s)).     Blood Culture    Component Value Date/Time   SDES URINE, RANDOM 01/31/2015 1125   SPECREQUEST NONE 01/31/2015 1125   CULT 30,000 COLONIES/mL VIRIDANS STREPTOCOCCUS 01/31/2015 1125   REPTSTATUS 02/03/2015 FINAL 01/31/2015 1125      Labs: No results found for this or any previous visit (from the past 48 hour(s)).   Lipid Panel     Component Value Date/Time   CHOL 153 12/10/2013 1222   TRIG 53.0 12/10/2013 1222   HDL 67.00 12/10/2013 1222   CHOLHDL 2 12/10/2013 1222   VLDL 10.6 12/10/2013 1222   LDLCALC 75 12/10/2013 1222   LDLDIRECT 158.1 05/29/2008 1137     No results found for: HGBA1C   Lab Results  Component Value Date   LDLCALC 75 12/10/2013   CREATININE 1.0 02/21/2015     HPI :81 y.o. female who was recently treated for a UTI.  Patient return to urgent care today for follow-up. Told urinalysis was normal but patient found to be tachycardic. She was advised to come to the ED. Patient denies chest pains or shortness of breath. No dizziness Patient is physically active and lives independently. Per daughter at bedside, patient cleans the house on a daily basis. Patient states her appetite at home has been fine, no unexpected weight loss. No fevers or chills.  EKG in the hospital showed atrial flutter 2:1 block, cardiology was consulted and plan was to perform TEE cardioversion. Cardioversion could not be performed because of poor image quality making it impossible to exclude thrombi. Patient was started on a noted on IV amiodarone infusion which has been now changed to by mouth amiodarone. Patient also started on anticoagulation with Eliquis.  HOSPITAL COURSE:    Atrial flutter - EKG shows atrial flutter with 2:1 block , cardiology has seen the patient and started on Eliquis 2.5 mg twice a day, amiodarone 200 mg twice a day, spironolactone 12.5 mg per day, metoprolol tartrate 25 mg twice a day, patient underwent TEE cardioversion, Unfortunately TEE cardioversion was not possible because the left atrial appendage could not be adequately visualized. . Iv amiodarone has been changed to by mouth amiodarone 200 mg twice a day for 3 weeks then 200 mg by mouth daily.follow-up in 2 weeks and elective cardioversion in 4 weeks   2. ? Liver and spleen metastasis - CT scan of the abdomen showed multiple masses in the spleen which are now larger than previous CT scan. Also shows a mass in the left lobe of the liver. All the tumor markers obtained in the hospital are negative. discussed with oncology Dr Lindi Adie  , who will see the patient in his clinic next week to discuss further testing. Patient has had these lesions seen on the CT scan of the abdomen since 2010. Daughter requested to be seen by Dr Inda Merlin ,appt was set up.Suspect  patient will need cardiology clearance before bx, invasive work up, also on eliquis    3. 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 Continue Percocet when necessary for pain.   4. Elevated d-dimer - patient has elevated d-dimer, CT angiogram was negative for pulmonary embolism and lower extremity venous duplex was negative for DVT. Patient has been complaining of intermittent swelling of the left thigh for quite some time.   5. DVT prophylaxis-patient on anticoagulation with Eliquis.   6.  Hypokalemia , potassium repleted    Discharge Exam:    Blood pressure 146/96, pulse 109, temperature 98.3 F (36.8 C), temperature source Oral, resp. rate 17, height 4\' 10"  (1.473 m), weight 55.792 kg (123 lb), SpO2 97 %.   General: appears in no acute distress  Cardiovascular: S1-S2 irregular  Respiratory: clear to auscultation bilaterally   Abdomen: Soft, nontender, no organomegaly   Musculoskeletal: no cyanosis/clubbing/edema of lower extremities      Follow-up Information    Follow up with [facility] R, NP On 02/25/2015.   Specialties:  Cardiology, Radiology   Why:  2:00 PM   Contact information:   [place] [address] [phone]       Follow up with Eilleen Kempf., MD.   Specialty:  Oncology   Why:  left message call pt   Contact information:   [place] [address] [phone]       Follow up with [facility].   Why:  Physical Therapy, Registered Nurse, [facility], Occupational Therapy   Contact information:   [address] [place] [address] [phone]       Follow up with Sinclair Grooms, MD. Schedule an appointment as soon as possible for a visit on 02/25/2015.   Specialty:  Cardiology   Why:  7-10 days pt app is on 02/25/15 @ 2:00   Contact information:   [address]  [place] [address] [phone]       Signed: Reyne Dumas 02/24/2015, 10:39 AM        Time spent >45 mins

## 2015-02-25 ENCOUNTER — Ambulatory Visit (INDEPENDENT_AMBULATORY_CARE_PROVIDER_SITE_OTHER): Payer: Medicare Other | Admitting: Cardiology

## 2015-02-25 ENCOUNTER — Encounter: Payer: Self-pay | Admitting: Cardiology

## 2015-02-25 ENCOUNTER — Encounter: Payer: Self-pay | Admitting: *Deleted

## 2015-02-25 VITALS — BP 130/70 | HR 53 | Ht <= 58 in | Wt 117.8 lb

## 2015-02-25 DIAGNOSIS — R001 Bradycardia, unspecified: Secondary | ICD-10-CM

## 2015-02-25 DIAGNOSIS — I5022 Chronic systolic (congestive) heart failure: Secondary | ICD-10-CM

## 2015-02-25 DIAGNOSIS — I4892 Unspecified atrial flutter: Secondary | ICD-10-CM | POA: Diagnosis not present

## 2015-02-25 MED ORDER — METOPROLOL TARTRATE 25 MG PO TABS: 12.5000 mg | ORAL_TABLET | Freq: Two times a day (BID) | ORAL | Status: DC

## 2015-02-25 NOTE — Patient Instructions (Addendum)
Medication Instructions:  1) DECREASE Metoprolol Tartrate to 12.5mg  twice daily 2) DECREASE Amiodarone to 200mg  once daily  Labwork: Your physician recommends that you return for lab work the week prior to your Cardioversion on 03/11/15.(CBC, CMP)   Testing/Procedures: Your physician would like for you to have a Cardioversion.  Electrical Cardioversion Electrical cardioversion is the delivery of a jolt of electricity to change the rhythm of the heart. Sticky patches or metal paddles are placed on the chest to deliver the electricity from a device. This is done to restore a normal rhythm. A rhythm that is too fast or not regular keeps the heart from pumping well. Electrical cardioversion is done in an emergency if:   There is low or no blood pressure as a result of the heart rhythm.   Normal rhythm must be restored as fast as possible to protect the brain and heart from further damage.   It may save a life. Cardioversion may be done for heart rhythms that are not immediately life threatening, such as atrial fibrillation or flutter, in which:   The heart is beating too fast or is not regular.   Medicine to change the rhythm has not worked.   It is safe to wait in order to allow time for preparation.  Symptoms of the abnormal rhythm are bothersome.  The risk of stroke and other serious problems can be reduced. LET [facility] CARE PROVIDER KNOW ABOUT:   Any allergies you have.  All medicines you are taking, including vitamins, herbs, eye drops, creams, and over-the-counter medicines.  Previous problems you or members of your family have had with the use of anesthetics.   Any blood disorders you have.   Previous surgeries you have had.   Medical conditions you have. RISKS AND COMPLICATIONS  Generally, this is a safe procedure. However, problems can occur and include:   Breathing problems related to the anesthetic used.  A blood clot that breaks free and travels to  other parts of your body. This could cause a stroke or other problems. The risk of this is lowered by use of blood-thinning medicine (anticoagulant) prior to the procedure.  Cardiac arrest (rare). BEFORE THE PROCEDURE   You may have tests to detect blood clots in your heart and to evaluate heart function.  You may start taking anticoagulants so your blood does not clot as easily.   Medicines may be given to help stabilize your heart rate and rhythm. PROCEDURE  You will be given medicine through an IV tube to reduce discomfort and make you sleepy (sedative).   An electrical shock will be delivered. AFTER THE PROCEDURE Your heart rhythm will be watched to make sure it does not change.    This information is not intended to replace advice given to you by your health care provider. Make sure you discuss any questions you have with your health care provider.   Document Released: 02/05/2002 Document Revised: 03/08/2014 Document Reviewed: 08/30/2012 Elsevier Interactive Patient Education 2016 [employer].   Follow-Up: Your physician recommends that you schedule a follow-up appointment the week after your Cardioversion with Dr. Tamala Julian or an extender.   Any Other Special Instructions Will Be Listed Below (If Applicable).     If you need a refill on your cardiac medications before your next appointment, please call your pharmacy.

## 2015-02-25 NOTE — Progress Notes (Signed)
Cardiology Office Note   Date:  02/25/2015   ID:  Cathy [name], DOB [birth year], MRN [id number]  PCP:  [name], MD  Cardiologist:  Dr. Tamala Julian    Chief Complaint  Patient presents with  . Atrial Flutter    no complaints      History of Present Illness: Cathy [name] is a 81 y.o. female who presents for post hospital follow up for 2:1 AV flutter with rate 122.  Amiodarone was started.  Also with acute systolic HF thought to be due to RVR.  EF 25-30%.    Also with possible cancer and has seen Dr. Julien Nordmann with plan for PET scan and further plan with results..    This patients CHA2DS2-VASc Score and unadjusted Ischemic Stroke Rate (% per year) is equal to 3.2 % stroke rate/year from a score of 3.  Pt underwent TEE with plan for DCCV but DCCV was not performed due to inadequate visualization of the LA to rule out thrombus.  She was discharged with increased metoprolol, amiodarone 200 mg BID for 3 weeks then daily.  If a flutter continued in 4 weeks plan for DCCV.  She was discharge on Eliquis.  D/c'd 02/06/15   Echo 02/02/15: Study Conclusions  - Left ventricle: The cavity size was normal. There was moderate concentric hypertrophy. Systolic function was severely reduced. The estimated ejection fraction was in the range of 25% to 30%. There is akinesis of the mid-apicalanteroseptal myocardium. Doppler parameters are consistent with a reversible restrictive pattern, indicative of decreased left ventricular diastolic compliance and/or increased left atrial pressure (grade 3 diastolic dysfunction). - Mitral valve: Moderately calcified annulus. There was moderate regurgitation. - Left atrium: The atrium was severely dilated. Volume/bsa, ES (1-plane Simpson&'s, A4C): 64.3 ml/m^2. - Right ventricle: Systolic function was mildly reduced. - Right atrium: The atrium was moderately dilated. - Tricuspid valve: There was moderate regurgitation. - Pulmonary arteries:  Systolic pressure was mildly increased. PA peak pressure: 33 mm Hg (S).  Today no complaints, no chest pain and no SOB.  Denied lightheadedness and dizziness.  Past Medical History  Diagnosis Date  . Hypertension   . Venous insufficiency   . Hypercholesteremia   . Hiatal hernia   . Dysphagia   . Diverticulosis of colon   . Irritable bowel syndrome   . Colitis     lymphocytic colitis feb 2011  . Urinary incontinence   . DJD (degenerative joint disease)   . Lumbar back pain   . Osteoporosis   . Anxiety   . Anemia   . Iron deficiency   . Vitamin B12 deficiency   . Allergic rhinitis   . Splenic lesion 02/18/2015  . Liver lesion 02/18/2015    Past Surgical History  Procedure Laterality Date  . Cataract surgery    . Abdominal hysterectomy    . Decompressive lumbar laminectomy  06/2008    at L2-3, L3-4 and L4-5 by Dr. Ronnald Ramp  . Laparoscopic cholecystectomy  04/2009    Dr. Abran Cantor  . Replacement total knee    . Tee without cardioversion N/A 02/03/2015    Procedure: TRANSESOPHAGEAL ECHOCARDIOGRAM (TEE);  Surgeon: Sueanne Margarita, MD;  Location: [facility];  Service: Cardiovascular;  Laterality: N/A;  . Cardioversion N/A 02/03/2015    Procedure: CARDIOVERSION;  Surgeon: Sueanne Margarita, MD;  Location: [facility];  Service: Cardiovascular;  Laterality: N/A;     Current Outpatient Prescriptions  Medication Sig Dispense Refill  . amiodarone (PACERONE) 200 MG tablet Take 200 mg  by mouth daily.    [name] apixaban (ELIQUIS) 2.5 MG TABS tablet Take 2.5 mg by mouth 2 (two) times daily.    [name] atorvastatin (LIPITOR) 20 MG tablet Take 1 tablet (20 mg total) by mouth daily at 6 PM. 30 tablet 0  . calcium-vitamin D (OSCAL WITH D) 500-200 MG-UNIT per tablet Take 1 tablet by mouth daily.     . cholecalciferol (VITAMIN D) 1000 UNITS tablet Take 1,000 Units by mouth daily.    . diclofenac (FLECTOR) 1.3 % PTCH Place 1 patch onto the skin every 12 (twelve) hours. Apply as directed by Dr. Brien Few      . docusate sodium (COLACE) 100 MG capsule Take 1 capsule (100 mg total) by mouth every 12 (twelve) hours. 60 capsule 0  . esomeprazole (NEXIUM) 40 MG capsule Take 1 capsule (40 mg total) by mouth daily. 90 capsule 3  . Ferrous Sulfate Dried (FEOSOL) 200 (65 FE) MG TABS Take 1 tablet by mouth 2 (two) times daily. Take with a vitamin c 500mg      . fexofenadine (ALLEGRA) 180 MG tablet Take 180 mg by mouth daily as needed for allergies or rhinitis.    . hydrochlorothiazide (MICROZIDE) 12.5 MG capsule   0  . metoprolol tartrate (LOPRESSOR) 25 MG tablet Take 0.5 tablets (12.5 mg total) by mouth 2 (two) times daily. 45 tablet 3  . oxyCODONE-acetaminophen (PERCOCET) 5-325 MG per tablet Take 1 tablet by mouth every 6 (six) hours as needed for moderate pain.     . polyethylene glycol (MIRALAX / GLYCOLAX) packet Take 17 g by mouth 2 (two) times daily. 28 each 0  . potassium chloride (K-DUR,KLOR-CON) 10 MEQ tablet Take 1 tablet (10 mEq total) by mouth daily. 30 tablet 0  . pregabalin (LYRICA) 50 MG capsule Take 1 capsule (50 mg total) by mouth 3 (three) times daily. 270 capsule 1  . PROLIA 60 MG/ML SOLN injection Inject 60 mg into the skin every 6 (six) months.     . solifenacin (VESICARE) 10 MG tablet Take 1 tablet (10 mg total) by mouth daily. 90 tablet 1  . spironolactone (ALDACTONE) 25 MG tablet Take 0.5 tablets (12.5 mg total) by mouth daily. 30 tablet 1  . vitamin B-12 (CYANOCOBALAMIN) 1000 MCG tablet Take 1 tablet (1,000 mcg total) by mouth daily.     No current facility-administered medications for this visit.    Allergies:   Amoxicillin-pot clavulanate and Sulfonamide derivatives    Social History:  The patient  reports that she has quit smoking. Her smoking use included Cigarettes. She has a 5 pack-year smoking history. She has never used smokeless tobacco. She reports that she does not drink alcohol or use illicit drugs.   Family History:  The patient's family history includes Diabetes in her  brother; Heart attack in her brother and mother. There is no history of Hypertension, Fainting, or Stroke.    ROS:  General:no colds or fevers, + weight decrease 7 lbs Skin:no rashes or ulcers HEENT:no blurred vision, no congestion CV:see HPI PUL:see HPI GI:no diarrhea constipation or melena, no indigestion GU:no hematuria, no dysuria MS:no joint pain, no claudication Neuro:no syncope, no lightheadedness Endo:no diabetes, no thyroid disease  Wt Readings from Last 3 Encounters:  02/25/15 117 lb 12.8 oz (53.434 kg)  02/18/15 124 lb 8 oz (56.473 kg)  01/31/15 123 lb (55.792 kg)     PHYSICAL EXAM: VS:  BP 130/70 mmHg  Pulse 53  Ht 4\' 10"  (1.473 m)  Wt 117 lb 12.8  oz (53.434 kg)  BMI 24.63 kg/m2 , BMI Body mass index is 24.63 kg/(m^2). General:Pleasant affect, NAD Skin:Warm and dry, brisk capillary refill HEENT:normocephalic, sclera clear, mucus membranes moist Neck:supple, no JVD, no bruits  Heart:irreg irreg without murmur, gallup, rub or click Lungs:clear without rales, rhonchi, or wheezes [id number], non tender, + BS, do not palpate liver spleen or masses Ext:no lower ext edema, 2+ pedal pulses, 2+ radial pulses Neuro:alert and oriented X 3, MAE, follows commands, + facial symmetry    EKG:  EKG is ordered today. The ekg ordered today demonstrates a flutter/fib now bradycardia at 52, other wise no changes except drop in rate.    Recent Labs: 01/31/2015: TSH 1.303 02/01/2015: B Natriuretic Peptide 421.8* 02/05/2015: Magnesium 1.9 02/21/2015: ALT 32; BUN 16.1; Creatinine 1.0; HGB 12.1; Platelets 173; Potassium 4.7; Sodium 143    Lipid Panel    Component Value Date/Time   CHOL 153 12/10/2013 1222   TRIG 53.0 12/10/2013 1222   HDL 67.00 12/10/2013 1222   CHOLHDL 2 12/10/2013 1222   VLDL 10.6 12/10/2013 1222   LDLCALC 75 12/10/2013 1222   LDLDIRECT 158.1 05/29/2008 1137       Other studies Reviewed: Additional studies/ records that were reviewed today include:  previous hospital notes. EKGs. .   ASSESSMENT AND PLAN:  1.  A flutter now slow Ventricular response. Amiodarone decreased to 20 mg once daily, and after discussion with Dr. Curt Bears decreased metoprolol to 12.5 mg BID. Will plan for DCCV week of jan 9,2017 which will be 5 weeks of anticoagulation  2. Systolic HF -euvolemic today  3. Anticoagulation has not missed any Eliquis  4. Frail but stable, no lightheadedness or dizzines wth HR 53.   Current medicines are reviewed with the patient today.  The patient Has no concerns regarding medicines.  The following changes have been made:  See above Labs/ tests ordered today include:see above  Disposition:   FU:  see above  Lennie Muckle, NP  02/25/2015 5:12 PM    [facility] [address], [place], [place], [place] Phone: [phone]; Fax: [phone]

## 2015-02-27 ENCOUNTER — Other Ambulatory Visit: Payer: Medicare Other

## 2015-03-03 NOTE — Discharge Summary (Signed)
Physician Discharge Summary  Cathy [name] MRN: [id number] DOB/AGE: December 29, 1927 82 y.o.  PCP: [name], MD   Admit date: 01/31/2015 Discharge date: 03/03/2015  Discharge Diagnoses:     Active Problems:   Constipation   Tachycardia   Fatigue   Abnormal CT of the abdomen   Prolonged Q-T interval on ECG   Sinus tachycardia (HCC)   Atrial flutter (HCC)   Acute systolic HF (heart failure) (HCC)   CAD (coronary artery disease), native coronary artery    Follow-up recommendations Follow-up with PCP in 3-5 days , including all  additional recommended appointments as below Follow-up CBC, CMP in 3-5 days  Electrical cardioversion in 3-4 weeks.  Cardiology follow-up in 10-14 days. Oncology follow-up with Dr. Julien Nordmann     Medication List    STOP taking these medications        aspirin 81 MG tablet     ciprofloxacin 500 MG tablet  Commonly known as:  CIPRO     simvastatin 40 MG tablet  Commonly known as:  ZOCOR      TAKE these medications        atorvastatin 20 MG tablet  Commonly known as:  LIPITOR  Take 1 tablet (20 mg total) by mouth daily at 6 PM.     calcium-vitamin D 500-200 MG-UNIT tablet  Commonly known as:  OSCAL WITH D  Take 1 tablet by mouth daily.     cholecalciferol 1000 units tablet  Commonly known as:  VITAMIN D  Take 1,000 Units by mouth daily.     docusate sodium 100 MG capsule  Commonly known as:  COLACE  Take 1 capsule (100 mg total) by mouth every 12 (twelve) hours.     esomeprazole 40 MG capsule  Commonly known as:  NEXIUM  Take 1 capsule (40 mg total) by mouth daily.     FEOSOL 200 (65 Fe) MG Tabs  Generic drug:  Ferrous Sulfate Dried  Take 1 tablet by mouth 2 (two) times daily. Take with a vitamin c 500mg      fexofenadine 180 MG tablet  Commonly known as:  ALLEGRA  Take 180 mg by mouth daily as needed for allergies or rhinitis.     FLECTOR 1.3 % Ptch  Generic drug:  diclofenac  Place 1 patch onto the skin every 12 (twelve) hours.  Apply as directed by Dr. Brien Few     oxyCODONE-acetaminophen 5-325 MG tablet  Commonly known as:  PERCOCET/ROXICET  Take 1 tablet by mouth every 6 (six) hours as needed for moderate pain.     polyethylene glycol packet  Commonly known as:  MIRALAX / GLYCOLAX  Take 17 g by mouth 2 (two) times daily.     potassium chloride 10 MEQ tablet  Commonly known as:  K-DUR,KLOR-CON  Take 1 tablet (10 mEq total) by mouth daily.     pregabalin 50 MG capsule  Commonly known as:  LYRICA  Take 1 capsule (50 mg total) by mouth 3 (three) times daily.     PROLIA 60 MG/ML Soln injection  Generic drug:  denosumab  Inject 60 mg into the skin every 6 (six) months.     solifenacin 10 MG tablet  Commonly known as:  VESICARE  Take 1 tablet (10 mg total) by mouth daily.     spironolactone 25 MG tablet  Commonly known as:  ALDACTONE  Take 0.5 tablets (12.5 mg total) by mouth daily.     vitamin B-12 1000 MCG tablet  Commonly known as:  CYANOCOBALAMIN  Take 1 tablet (1,000 mcg total) by mouth daily.         Discharge Condition:    Discharge Instructions   Discharge Instructions    Diet - low sodium heart healthy    Complete by:  As directed      Increase activity slowly    Complete by:  As directed            Allergies  Allergen Reactions  . Amoxicillin-Pot Clavulanate     REACTION: diarrhea  . Sulfonamide Derivatives     REACTION: unsure of reaction      Disposition: 01-Home or Self Care   Consults: Cardiology      Significant Diagnostic Studies:  Ct Angio Chest Pe W/cm &/or Wo Cm  02/01/2015  CLINICAL DATA:  Tachycardia. EXAM: CT ANGIOGRAPHY CHEST WITH CONTRAST TECHNIQUE: Multidetector CT imaging of the chest was performed using the standard protocol during bolus administration of intravenous contrast. Multiplanar CT image reconstructions and MIPs were obtained to evaluate the vascular anatomy. CONTRAST:  46mL OMNIPAQUE IOHEXOL 350 MG/ML SOLN COMPARISON:  None FINDINGS:  THORACIC INLET/BODY WALL: No acute abnormality. MEDIASTINUM: Cardiomegaly with particularly notable right atrial enlargement. There is mitral annular ossification. Atherosclerosis, including the coronary arteries. Limited opacification of the aorta without evidence of acute aortic disease. No pericardial effusion. Large main pulmonary arteries, with the right branch measuring 28 mm. This could reflect pulmonary hypertension. No evidence of pulmonary embolism. Large hiatal hernia. LUNG WINDOWS: Small pleural effusions with basilar atelectasis. No indication of pneumonia or edema. UPPER ABDOMEN: No acute findings. Low-density 3 cm mass in the left adrenal gland is stable from 2010 myelogram and consistent with adenoma. OSSEOUS: Severe right glenohumeral arthropathy with joint effusion and degenerative cyst in the subscapular fossa. Severe and diffuse thoracic disc degeneration. No acute osseous finding. Review of the MIP images confirms the above findings. IMPRESSION: 1. Negative for pulmonary embolism. 2. Small pleural effusions and bibasilar atelectasis. 3. Large hiatal hernia. Electronically Signed   By: Monte Fantasia M.D.   On: 02/01/2015 11:31   Ct Abdomen Pelvis W Contrast  01/31/2015  CLINICAL DATA:  Tachycardia, lower abdominal pain. Recently treated for urinary tract infection. History of colitis, diverticulosis and hiatal hernia. EXAM: CT ABDOMEN AND PELVIS WITH CONTRAST TECHNIQUE: Multidetector CT imaging of the abdomen and pelvis was performed using the standard protocol following bolus administration of intravenous contrast. CONTRAST:  578mL OMNIPAQUE IOHEXOL 300 MG/ML  SOLN COMPARISON:  Previous exams including CT abdomen and pelvis dated 04/19/2009 and 08/29/2008. FINDINGS: Lower chest: There is mild bibasilar scarring/atelectasis and small bilateral pleural effusions. Moderate to large hiatal hernia is unchanged. Hepatobiliary: There is slightly increased intrahepatic and extrahepatic bile duct  dilatation, common bile duct now dilated to approximately 14 mm (previously measured at 12.5 mm). No obstructing bile duct stone or bile duct mass identified. There is a new enhancing lesion within the inferior left hepatic lobe, likely segment 4B, measuring 2.7 x 1.5 cm, worrisome for metastasis. No other enhancing mass or lesion identified within the liver. Patient is status post cholecystectomy. Pancreas: No mass, inflammatory changes, or other significant abnormality. No significant pancreatic duct dilatation. Spleen: There are numerous hypodense masses within the spleen, increased significantly in number compared to previous studies, and largest lesions have increased in size. Largest lesion within the posterior aspect of the spleen measures 2.8 x 2.7 cm (2.1 cm greatest dimension on the previous exam, and approximately 1.5 cm on the earlier exam of 08/29/2008). Next largest lesion  within the anterior spleen measures 2.7 x 2.3 cm (measured at 2.4 cm greatest dimension on the previous exam). The increase in size and number suggests neoplastic process. Adrenals/Urinary Tract: Left adrenal mass is slightly decreased in prominence measuring 3.2 x 2.1 cm on today's exam (previously measured at 3.4 x 2.5 cm). Right adrenal gland is normal. Both kidneys are unremarkable without stone or hydronephrosis. No ureteral or bladder calculi identified. Bladder is unremarkable. Stomach/Bowel: Bowel is normal in caliber. Scattered diverticulosis noted within the descending and sigmoid colon without evidence of acute diverticulitis. Bowel is difficult to definitively characterize without oral contrast but there is no obvious evidence of bowel wall thickening or bowel wall inflammation. Appendix is not seen but there are no inflammatory changes about the cecum to suggest acute appendicitis. Vascular/Lymphatic: Scattered atherosclerotic changes again noted along the walls of the ectatic but normal-caliber abdominal aorta. No acute  vascular abnormality seen. No enlarged lymph nodes identified in the abdomen or pelvis. Reproductive: No mass or other significant abnormality. Other: No free fluid or abscess collections seen. No free intraperitoneal air. Heart is at least mildly enlarged, incompletely imaged at the upper portion of this exam. Musculoskeletal: Degenerative changes are seen throughout the slightly scoliotic thoracolumbar spine but no acute osseous abnormality. No evidence of osseous metastasis seen. Evidence of a previous surgical decompression of the central canal noted in the lower lumbar spine. IMPRESSION: 1. Increased number and size of the numerous splenic masses indicating neoplastic process, possibly lymphoma or metastasis as suggested on the previous exam. 2. Stable, or slightly smaller, left adrenal mass. This also remains suspicious for neoplastic process. 3. New enhancing lesion within the left hepatic lobe, segment 4B, measuring 2.8 x 1.5 cm. This is also suspicious for metastatic disease. 4. Mildly increased intrahepatic and extrahepatic bile duct dilatation, likely benign in nature and commensurate to the previous cholecystectomy. No bile duct stone seen. No obstructing mass seen. Could consider correlation with liver function tests. 5. Colonic diverticulosis without evidence of acute diverticulitis. 6. Fairly large amount of stool throughout the nondistended colon (constipation? ). No evidence of bowel obstruction or focal bowel wall inflammation. 7. Hiatal hernia, moderate to large in size, grossly stable compared to previous exams. 8. Other chronic/incidental findings detailed above. Electronically Signed   By: Franki Cabot M.D.   On: 01/31/2015 15:28    2-D echo LV EF: 25% -  30%  ------------------------------------------------------------------- Indications:   Atrial flutter 427.32.  ------------------------------------------------------------------- History:  PMH: Former Smoker, Tachycardia.  Atrial flutter.  ------------------------------------------------------------------- Study Conclusions  - Left ventricle: The cavity size was normal. There was moderate concentric hypertrophy. Systolic function was severely reduced. The estimated ejection fraction was in the range of 25% to 30%. There is akinesis of the mid-apicalanteroseptal myocardium. Doppler parameters are consistent with a reversible restrictive pattern, indicative of decreased left ventricular diastolic compliance and/or increased left atrial pressure (grade 3 diastolic dysfunction).   Filed Weights   01/31/15 1117  Weight: 55.792 kg (123 lb)     Microbiology: No results found for this or any previous visit (from the past 240 hour(s)).     Blood Culture    Component Value Date/Time   SDES URINE, RANDOM 01/31/2015 1125   SPECREQUEST NONE 01/31/2015 1125   CULT 30,000 COLONIES/mL VIRIDANS STREPTOCOCCUS 01/31/2015 1125   REPTSTATUS 02/03/2015 FINAL 01/31/2015 1125      Labs: No results found for this or any previous visit (from the past 48 hour(s)).   Lipid Panel     Component  Value Date/Time   CHOL 153 12/10/2013 1222   TRIG 53.0 12/10/2013 1222   HDL 67.00 12/10/2013 1222   CHOLHDL 2 12/10/2013 1222   VLDL 10.6 12/10/2013 1222   LDLCALC 75 12/10/2013 1222   LDLDIRECT 158.1 05/29/2008 1137     No results found for: HGBA1C   Lab Results  Component Value Date   LDLCALC 75 12/10/2013   CREATININE 1.0 02/21/2015     HPI :82 y.o. female who was recently treated for a UTI. Patient return to urgent care today for follow-up. Told urinalysis was normal but patient found to be tachycardic. She was advised to come to the ED. Patient denies chest pains or shortness of breath. No dizziness Patient is physically active and lives independently. Per daughter at bedside, patient cleans the house on a daily basis. Patient states her appetite at home has been fine, no unexpected weight  loss. No fevers or chills.  EKG in the hospital showed atrial flutter 2:1 block, cardiology was consulted and plan was to perform TEE cardioversion. Cardioversion could not be performed because of poor image quality making it impossible to exclude thrombi. Patient was started on a noted on IV amiodarone infusion which has been now changed to by mouth amiodarone. Patient also started on anticoagulation with Eliquis.  HOSPITAL COURSE:    Atrial flutter - EKG shows atrial flutter with 2:1 block , cardiology has seen the patient and started on Eliquis 2.5 mg twice a day, amiodarone 200 mg twice a day, spironolactone 12.5 mg per day, metoprolol tartrate 25 mg twice a day, patient underwent TEE cardioversion, Unfortunately TEE cardioversion was not possible because the left atrial appendage could not be adequately visualized. . Iv amiodarone has been changed to by mouth amiodarone 200 mg twice a day for 3 weeks then 200 mg by mouth daily.follow-up in 2 weeks and elective cardioversion in 4 weeks   2. ? Liver and spleen metastasis - CT scan of the abdomen showed multiple masses in the spleen which are now larger than previous CT scan. Also shows a mass in the left lobe of the liver. All the tumor markers obtained in the hospital are negative. discussed with oncology Dr Lindi Adie  , who will see the patient in his clinic next week to discuss further testing. Patient has had these lesions seen on the CT scan of the abdomen since 2010. Daughter requested to be seen by Dr Inda Merlin ,appt was set up.Suspect patient will need cardiology clearance before bx, invasive work up, also on eliquis    3. 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 Continue Percocet when necessary for pain.   4. Elevated d-dimer - patient has elevated d-dimer, CT angiogram was negative for pulmonary embolism and lower extremity venous duplex was  negative for DVT. Patient has been complaining of intermittent swelling of the left thigh for quite some time.   5. DVT prophylaxis-patient on anticoagulation with Eliquis.   6. Hypokalemia , potassium repleted    Discharge Exam:    Blood pressure 146/96, pulse 109, temperature 98.3 F (36.8 C), temperature source Oral, resp. rate 17, height 4\' 10"  (1.473 m), weight 55.792 kg (123 lb), SpO2 97 %.   General: appears in no acute distress  Cardiovascular: S1-S2 irregular  Respiratory: clear to auscultation bilaterally   Abdomen: Soft, nontender, no organomegaly   Musculoskeletal: no cyanosis/clubbing/edema of lower extremities      Follow-up Information  Follow up with [facility] R, NP On 02/25/2015.   Specialties:  Cardiology, Radiology   Why:  2:00 PM   Contact information:   [place] [address] [phone]       Follow up with Eilleen Kempf., MD.   Specialty:  Oncology   Why:  left message call pt   Contact information:   [place] [address] [phone]       Follow up with [facility].   Why:  Physical Therapy, Registered Nurse, AIde, Occupational Therapy   Contact information:   [address] [place] [address] [phone]       Follow up with Sinclair Grooms, MD. Schedule an appointment as soon as possible for a visit on 02/25/2015.   Specialty:  Cardiology   Why:  7-10 days pt app is on 02/25/15 @ 2:00   Contact information:   [address] [place] [address] [phone]       Signed: Reyne Dumas 03/03/2015, 7:44 AM        Time spent >45 mins

## 2015-03-04 ENCOUNTER — Other Ambulatory Visit: Payer: Self-pay | Admitting: *Deleted

## 2015-03-04 ENCOUNTER — Other Ambulatory Visit: Payer: Medicare Other | Admitting: *Deleted

## 2015-03-04 DIAGNOSIS — I4892 Unspecified atrial flutter: Secondary | ICD-10-CM

## 2015-03-04 LAB — COMPREHENSIVE METABOLIC PANEL
ALK PHOS: 72 U/L (ref 33–130)
ALT: 52 U/L — AB (ref 6–29)
AST: 45 U/L — ABNORMAL HIGH (ref 10–35)
Albumin: 4 g/dL (ref 3.6–5.1)
BUN: 17 mg/dL (ref 7–25)
CALCIUM: 8.9 mg/dL (ref 8.6–10.4)
CHLORIDE: 106 mmol/L (ref 98–110)
CO2: 29 mmol/L (ref 20–31)
Creat: 1.04 mg/dL — ABNORMAL HIGH (ref 0.60–0.88)
GLUCOSE: 77 mg/dL (ref 65–99)
POTASSIUM: 5.3 mmol/L (ref 3.5–5.3)
Sodium: 143 mmol/L (ref 135–146)
TOTAL PROTEIN: 6.2 g/dL (ref 6.1–8.1)
Total Bilirubin: 0.5 mg/dL (ref 0.2–1.2)

## 2015-03-04 LAB — CBC
HEMATOCRIT: 37.2 % (ref 36.0–46.0)
Hemoglobin: 12.4 g/dL (ref 12.0–15.0)
MCH: 31.1 pg (ref 26.0–34.0)
MCHC: 33.3 g/dL (ref 30.0–36.0)
MCV: 93.2 fL (ref 78.0–100.0)
MPV: 11.2 fL (ref 8.6–12.4)
PLATELETS: 182 10*3/uL (ref 150–400)
RBC: 3.99 MIL/uL (ref 3.87–5.11)
RDW: 13.8 % (ref 11.5–15.5)
WBC: 9.4 10*3/uL (ref 4.0–10.5)

## 2015-03-04 MED ORDER — ATORVASTATIN CALCIUM 20 MG PO TABS: 20.0000 mg | ORAL_TABLET | Freq: Every day | ORAL | Status: DC

## 2015-03-04 MED ORDER — APIXABAN 2.5 MG PO TABS: 2.5000 mg | ORAL_TABLET | Freq: Two times a day (BID) | ORAL | Status: DC

## 2015-03-04 MED ORDER — AMIODARONE HCL 200 MG PO TABS: 200.0000 mg | ORAL_TABLET | Freq: Every day | ORAL | Status: DC

## 2015-03-04 MED ORDER — POTASSIUM CHLORIDE CRYS ER 10 MEQ PO TBCR: 10.0000 meq | EXTENDED_RELEASE_TABLET | Freq: Every day | ORAL | Status: DC

## 2015-03-05 ENCOUNTER — Telehealth: Payer: Self-pay | Admitting: *Deleted

## 2015-03-05 ENCOUNTER — Telehealth: Payer: Self-pay

## 2015-03-05 ENCOUNTER — Ambulatory Visit: Payer: Medicare Other | Admitting: Internal Medicine

## 2015-03-05 NOTE — Telephone Encounter (Signed)
Pt's daughter called and wanted me to go over pt's labs with her.  They were discussed with her and she verbalized appreciation and understanding.

## 2015-03-05 NOTE — Telephone Encounter (Signed)
-----   Message from Isaiah Serge, NP sent at 03/05/2015  9:24 AM EST ----- Labs ok for DCCV, her liver function may be related to amiodarone but we decreased dose- will follow up on next visit.

## 2015-03-05 NOTE — Telephone Encounter (Signed)
Spoke with pt about her lab results.  She may have her daughter call back and get them as well.  Pt was advised that would be fine.  Pt verbalized understanding.

## 2015-03-05 NOTE — Telephone Encounter (Signed)
Prior auth for Eliquis 2.5 mg obtained by [facility]. Good through 03/14/2016. Case ID [id number]. Pharmacy notified.

## 2015-03-07 ENCOUNTER — Telehealth: Payer: Self-pay

## 2015-03-07 NOTE — Telephone Encounter (Signed)
Pt daughter mary aware of Sandria Senter and Dr.Smith's instruction. Mickel Baas reviewed with Dr.Smith and he agreed with plan. Hold metoprolol on AM of procedure.  Mary verbalized understanding.Stanton Kidney sts that she will make sure pt holds her Metoprolol the morning of 03/10/15.

## 2015-03-09 NOTE — Anesthesia Preprocedure Evaluation (Addendum)
Anesthesia Evaluation  Patient identified by MRN, date of birth, ID band Patient awake    Reviewed: Allergy & Precautions, NPO status , Patient's Chart, lab work & pertinent test results, reviewed documented beta blocker date and time   History of Anesthesia Complications Negative for: history of anesthetic complications  Airway Mallampati: II  TM Distance: >3 FB Neck ROM: Full    Dental no notable dental hx.    Pulmonary former smoker,    Pulmonary exam normal breath sounds clear to auscultation       Cardiovascular Exercise Tolerance: Poor hypertension, Pt. on medications and Pt. on home beta blockers + CAD and + Peripheral Vascular Disease  Normal cardiovascular exam+ dysrhythmias Atrial Fibrillation + Valvular Problems/Murmurs AI and MR  Rhythm:Irregular Rate:Normal  ECHO 02/02/2015 EF 20 - 25%, TEE 12/5 inadequate to RO thrombus, NO CV attempted, placed on anticoags and plan to CV in 5 weeks.   Neuro/Psych Anxiety  Neuromuscular disease negative psych ROS   GI/Hepatic negative GI ROS, Neg liver ROS, hiatal hernia, GERD  Medicated,  Endo/Other  negative endocrine ROS  Renal/GU Creat 1.04  negative genitourinary   Musculoskeletal  (+) Arthritis , Osteoarthritis,    Abdominal   Peds negative pediatric ROS (+)  Hematology  (+) Blood dyscrasia (on Eliquis), ,   Anesthesia Other Findings   Reproductive/Obstetrics negative OB ROS                        Anesthesia Physical  Anesthesia Plan  ASA: III  Anesthesia Plan: MAC   Post-op Pain Management:    Induction: Intravenous  Airway Management Planned: Mask  Additional Equipment:   Intra-op Plan:   Post-operative Plan:   Informed Consent: I have reviewed the patients History and Physical, chart, labs and discussed the procedure including the risks, benefits and alternatives for the proposed anesthesia with the patient or authorized  representative who has indicated his/her understanding and acceptance.   Dental advisory given  Plan Discussed with: CRNA and Surgeon  Anesthesia Plan Comments:         Anesthesia Quick Evaluation

## 2015-03-10 ENCOUNTER — Encounter (HOSPITAL_COMMUNITY): Payer: Self-pay

## 2015-03-10 ENCOUNTER — Ambulatory Visit (HOSPITAL_COMMUNITY): Payer: Medicare Other | Admitting: Anesthesiology

## 2015-03-10 ENCOUNTER — Ambulatory Visit (HOSPITAL_COMMUNITY)
Admission: RE | Admit: 2015-03-10 | Discharge: 2015-03-10 | Disposition: A | Discharge status: Home / Self Care | Payer: Medicare Other | Source: Ambulatory Visit | Attending: Cardiology | Admitting: Cardiology

## 2015-03-10 ENCOUNTER — Encounter (HOSPITAL_COMMUNITY)
Admission: RE | Disposition: A | Discharge status: Home / Self Care | Payer: Self-pay | Source: Ambulatory Visit | Attending: Cardiology

## 2015-03-10 DIAGNOSIS — Z7901 Long term (current) use of anticoagulants: Secondary | ICD-10-CM | POA: Diagnosis not present

## 2015-03-10 DIAGNOSIS — K219 Gastro-esophageal reflux disease without esophagitis: Secondary | ICD-10-CM | POA: Insufficient documentation

## 2015-03-10 DIAGNOSIS — D7389 Other diseases of spleen: Secondary | ICD-10-CM | POA: Insufficient documentation

## 2015-03-10 DIAGNOSIS — K589 Irritable bowel syndrome without diarrhea: Secondary | ICD-10-CM | POA: Diagnosis not present

## 2015-03-10 DIAGNOSIS — I110 Hypertensive heart disease with heart failure: Secondary | ICD-10-CM | POA: Diagnosis not present

## 2015-03-10 DIAGNOSIS — I2510 Atherosclerotic heart disease of native coronary artery without angina pectoris: Secondary | ICD-10-CM | POA: Diagnosis not present

## 2015-03-10 DIAGNOSIS — M1990 Unspecified osteoarthritis, unspecified site: Secondary | ICD-10-CM | POA: Diagnosis not present

## 2015-03-10 DIAGNOSIS — D509 Iron deficiency anemia, unspecified: Secondary | ICD-10-CM | POA: Diagnosis not present

## 2015-03-10 DIAGNOSIS — I509 Heart failure, unspecified: Secondary | ICD-10-CM | POA: Diagnosis not present

## 2015-03-10 DIAGNOSIS — E7800 Pure hypercholesterolemia, unspecified: Secondary | ICD-10-CM | POA: Insufficient documentation

## 2015-03-10 DIAGNOSIS — Z79899 Other long term (current) drug therapy: Secondary | ICD-10-CM | POA: Insufficient documentation

## 2015-03-10 DIAGNOSIS — K769 Liver disease, unspecified: Secondary | ICD-10-CM | POA: Diagnosis not present

## 2015-03-10 DIAGNOSIS — E538 Deficiency of other specified B group vitamins: Secondary | ICD-10-CM | POA: Diagnosis not present

## 2015-03-10 DIAGNOSIS — Z87891 Personal history of nicotine dependence: Secondary | ICD-10-CM | POA: Diagnosis not present

## 2015-03-10 DIAGNOSIS — I4891 Unspecified atrial fibrillation: Secondary | ICD-10-CM | POA: Insufficient documentation

## 2015-03-10 DIAGNOSIS — M810 Age-related osteoporosis without current pathological fracture: Secondary | ICD-10-CM | POA: Insufficient documentation

## 2015-03-10 HISTORY — PX: CARDIOVERSION: SHX1299

## 2015-03-10 SURGERY — CARDIOVERSION
Anesthesia: Monitor Anesthesia Care

## 2015-03-10 MED ORDER — MEPERIDINE HCL 100 MG/ML IJ SOLN: 6.2500 mg | INTRAMUSCULAR | Status: DC | PRN

## 2015-03-10 MED ORDER — PROPOFOL 10 MG/ML IV BOLUS
INTRAVENOUS | Status: DC | PRN
  Administered 2015-03-10: 30 mg via INTRAVENOUS

## 2015-03-10 MED ORDER — SODIUM CHLORIDE 0.9 % IV SOLN
INTRAVENOUS | Status: DC
  Administered 2015-03-10: 500 mL via INTRAVENOUS

## 2015-03-10 MED ORDER — PROMETHAZINE HCL 25 MG/ML IJ SOLN: 6.2500 mg | INTRAMUSCULAR | Status: DC | PRN

## 2015-03-10 MED ORDER — FENTANYL CITRATE (PF) 100 MCG/2ML IJ SOLN: 25.0000 ug | INTRAMUSCULAR | Status: DC | PRN

## 2015-03-10 MED ORDER — LIDOCAINE HCL (CARDIAC) 20 MG/ML IV SOLN
INTRAVENOUS | Status: DC | PRN
  Administered 2015-03-10: 40 mg via INTRAVENOUS

## 2015-03-10 NOTE — CV Procedure (Signed)
    Cardioversion Note  Cathy [name] [id number] 27-Jul-1927  Procedure: DC Cardioversion Indications: atrial fibrillation  Procedure Details Consent: Obtained Time Out: Verified patient identification, verified procedure, site/side was marked, verified correct patient position, special equipment/implants available, Radiology Safety Procedures followed,  medications/allergies/relevent history reviewed, required imaging and test results available.  Performed  The patient has been on adequate anticoagulation.  The patient received IV propofol and lidocaine administered by anesthesia staff for sedation.  Synchronous cardioversion was performed at 120 joules.  The cardioversion was successful.   Complications: No apparent complications Patient did tolerate procedure well.   Dorothy Spark, MD, [facility] 03/10/2015, 11:43 AM

## 2015-03-10 NOTE — Interval H&P Note (Signed)
History and Physical Interval Note:  03/10/2015 11:16 AM  Cathy [name]  has presented today for surgery, with the diagnosis of afib/aflutter  The various methods of treatment have been discussed with the patient and family. After consideration of risks, benefits and other options for treatment, the patient has consented to  Procedure(s): CARDIOVERSION (N/A) as a surgical intervention .  The patient's history has been reviewed, patient examined, no change in status, stable for surgery.  I have reviewed the patient's chart and labs.  Questions were answered to the patient's satisfaction.     Dorothy Spark

## 2015-03-10 NOTE — Anesthesia Postprocedure Evaluation (Signed)
Anesthesia Post Note  Patient: Cathy [name]  Procedure(s) Performed: Procedure(s) (LRB): CARDIOVERSION (N/A)  Patient location during evaluation: PACU Anesthesia Type: MAC Level of consciousness: awake and alert Pain management: pain level controlled Vital Signs Assessment: post-procedure vital signs reviewed and stable Respiratory status: spontaneous breathing, nonlabored ventilation, respiratory function stable and patient connected to nasal cannula oxygen Cardiovascular status: stable and blood pressure returned to baseline Anesthetic complications: no    Last Vitals:  Filed Vitals:   03/10/15 1210 03/10/15 1220  BP: 125/49 138/50  Pulse: 43 40  Temp:    Resp: 15 14    Last Pain: There were no vitals filed for this visit.               Catalina Gravel

## 2015-03-10 NOTE — H&P (View-Only) (Signed)
[place] [phone]   [phone]  CONSULT NOTE  REFERRING PHYSICIAN: Dr. Reyne Dumas  REASON FOR CONSULTATION:  82 years old white female with liver and splenic lesions questionable for lymphoma.  HPI Cathy [name] is a 82 y.o. female with past medical history significant for multiple medical problems including history of dyslipidemia, anxiety, irritable bowel syndrome, C. difficile colitis, degenerative joint disease, urinary incontinence, congestive heart failure, coronary artery disease as well as anxiety. The patient was admitted to [facility] complaining of tachycardia and she was treated 2 weeks before for urinary tract infection. She was treated for atrial flutter at that time. During her evaluation she had CT scan of the abdomen and pelvis performed that showed numerous hypodense masses within the spleen, increased significantly in number compared to previous studies, and largest lesions have increased in size. Largest lesion within the posterior aspect of the spleen measures 2.8 x 2.7 cm (2.1 cm greatest dimension on the previous exam, and approximately 1.5 cm on the earlier exam of 08/29/2008). Next largest lesion within the anterior spleen measures 2.7 x 2.3 cm (measured at 2.4 cm greatest dimension on the previous exam). The increase in size and number suggests neoplastic process. There is a new enhancing lesion within the inferior left hepatic lobe, likely segment 4B, measuring 2.7 x 1.5 cm, worrisome for metastasis. There was also mildly increased intrahepatic and extrahepatic bile duct dilatation likely benign in nature. There was also stable to slightly smaller left adrenal mass and this is still suspicious for neoplastic process. The patient was referred to me today for further evaluation and recommendation regarding these abnormality on her CT scan of the abdomen and pelvis. The CT angiogram of the chest performed on 02/01/2015  was negative for pulmonary embolism and it showed small pleural effusions and bibasilar atelectasis as well as large hiatal hernia. When seen today the patient complains of lack of energy and fatigue. She denied having any significant chest pain, shortness breath, cough or hemoptysis. She denied having any nausea, vomiting, abdominal pain, diarrhea or constipation. She has no significant weight loss or night sweats. She has no headache or visual changes. Family history significant for mother with heart failure, father had prostate cancer, sister and daughter with lung cancer and brother with colon cancer. The patient is a widow and has 2 children. She used to work at [employer]. She has no history of smoking, alcohol or drug abuse.  HPI  Past Medical History  Diagnosis Date  . Hypertension   . Venous insufficiency   . Hypercholesteremia   . Hiatal hernia   . Dysphagia   . Diverticulosis of colon   . Irritable bowel syndrome   . Colitis     lymphocytic colitis feb 2011  . Urinary incontinence   . DJD (degenerative joint disease)   . Lumbar back pain   . Osteoporosis   . Anxiety   . Anemia   . Iron deficiency   . Vitamin B12 deficiency   . Allergic rhinitis   . Splenic lesion 02/18/2015  . Liver lesion 02/18/2015    Past Surgical History  Procedure Laterality Date  . Cataract surgery    . Abdominal hysterectomy    . Decompressive lumbar laminectomy  06/2008    at L2-3, L3-4 and L4-5 by Dr. Ronnald Ramp  . Laparoscopic cholecystectomy  04/2009    Dr. Abran Cantor  . Replacement total knee    . Tee without cardioversion N/A 02/03/2015  Procedure: TRANSESOPHAGEAL ECHOCARDIOGRAM (TEE);  Surgeon: Sueanne Margarita, MD;  Location: [place];  Service: Cardiovascular;  Laterality: N/A;  . Cardioversion N/A 02/03/2015    Procedure: CARDIOVERSION;  Surgeon: Sueanne Margarita, MD;  Location: [facility];  Service: Cardiovascular;  Laterality: N/A;    History reviewed. No pertinent family  history.  Social History Social History  Substance Use Topics  . Smoking status: Former Smoker -- 0.50 packs/day for 10 years    Types: Cigarettes  . Smokeless tobacco: Never Used  . Alcohol Use: No    Allergies  Allergen Reactions  . Amoxicillin-Pot Clavulanate     REACTION: diarrhea  . Sulfonamide Derivatives     REACTION: unsure of reaction    Current Outpatient Prescriptions  Medication Sig Dispense Refill  . amiodarone (PACERONE) 200 MG tablet Take 1 tablet (200 mg total) by mouth 2 (two) times daily. 60 tablet 0  . apixaban (ELIQUIS) 2.5 MG TABS tablet Take 1 tablet (2.5 mg total) by mouth 2 (two) times daily. 60 tablet 0  . atorvastatin (LIPITOR) 20 MG tablet Take 1 tablet (20 mg total) by mouth daily at 6 PM. 30 tablet 0  . calcium-vitamin D (OSCAL WITH D) 500-200 MG-UNIT per tablet Take 1 tablet by mouth daily.     . cholecalciferol (VITAMIN D) 1000 UNITS tablet Take 1,000 Units by mouth daily.    . diclofenac (FLECTOR) 1.3 % PTCH Place 1 patch onto the skin every 12 (twelve) hours. Apply as directed by Dr. Brien Few    . docusate sodium (COLACE) 100 MG capsule Take 1 capsule (100 mg total) by mouth every 12 (twelve) hours. 60 capsule 0  . esomeprazole (NEXIUM) 40 MG capsule Take 1 capsule (40 mg total) by mouth daily. 90 capsule 3  . Ferrous Sulfate Dried (FEOSOL) 200 (65 FE) MG TABS Take 1 tablet by mouth 2 (two) times daily. Take with a vitamin c 500mg      . fexofenadine (ALLEGRA) 180 MG tablet Take 180 mg by mouth daily as needed for allergies or rhinitis.    . hydrochlorothiazide (MICROZIDE) 12.5 MG capsule   0  . metoprolol tartrate (LOPRESSOR) 25 MG tablet Take 1 tablet (25 mg total) by mouth 2 (two) times daily. 60 tablet 0  . oxyCODONE-acetaminophen (PERCOCET) 5-325 MG per tablet Take 1 tablet by mouth every 6 (six) hours as needed for moderate pain.     . polyethylene glycol (MIRALAX / GLYCOLAX) packet Take 17 g by mouth 2 (two) times daily. 28 each 0  . potassium  chloride (K-DUR,KLOR-CON) 10 MEQ tablet Take 1 tablet (10 mEq total) by mouth daily. 30 tablet 0  . pregabalin (LYRICA) 50 MG capsule Take 1 capsule (50 mg total) by mouth 3 (three) times daily. 270 capsule 1  . PROLIA 60 MG/ML SOLN injection Inject 60 mg into the skin every 6 (six) months.     . solifenacin (VESICARE) 10 MG tablet Take 1 tablet (10 mg total) by mouth daily. 90 tablet 1  . spironolactone (ALDACTONE) 25 MG tablet Take 0.5 tablets (12.5 mg total) by mouth daily. 30 tablet 1  . vitamin B-12 (CYANOCOBALAMIN) 1000 MCG tablet Take 1 tablet (1,000 mcg total) by mouth daily.     No current facility-administered medications for this visit.    Review of Systems  Constitutional: positive for fatigue Eyes: negative Ears, nose, mouth, throat, and face: negative Respiratory: negative Cardiovascular: negative Gastrointestinal: negative Genitourinary:negative Integument/breast: negative Hematologic/lymphatic: negative Musculoskeletal:positive for muscle weakness Neurological: negative Behavioral/Psych: negative Endocrine:  negative Allergic/Immunologic: negative  Physical Exam  [id number], healthy, no distress, well nourished and well developed SKIN: skin color, texture, turgor are normal, no rashes or significant lesions HEAD: Normocephalic, No masses, lesions, tenderness or abnormalities EYES: normal, PERRLA, Conjunctiva are pink and non-injected EARS: External ears normal, Canals clear OROPHARYNX:no exudate, no erythema and lips, buccal mucosa, and tongue normal  NECK: supple, no adenopathy, no JVD LYMPH:  no palpable lymphadenopathy, no hepatosplenomegaly BREAST:not examined LUNGS: clear to auscultation , and palpation HEART: regular rate & rhythm and no murmurs ABDOMEN:abdomen soft, non-tender, normal bowel sounds and no masses or organomegaly BACK: Back symmetric, no curvature., No CVA tenderness EXTREMITIES:no joint deformities, effusion, or inflammation, no edema, no  skin discoloration  NEURO: alert & oriented x 3 with fluent speech, no focal motor/sensory deficits  PERFORMANCE STATUS: ECOG 2  LABORATORY DATA: Lab Results  Component Value Date   WBC 7.5 02/06/2015   HGB 11.6* 02/06/2015   HCT 35.3* 02/06/2015   MCV 93.6 02/06/2015   PLT 185 02/06/2015      Chemistry      Component Value Date/Time   NA 141 02/06/2015 0520   K 4.2 02/06/2015 0520   CL 109 02/06/2015 0520   CO2 25 02/06/2015 0520   BUN 7 02/06/2015 0520   CREATININE 0.69 02/06/2015 0520   CREATININE 0.75 01/16/2015 2037      Component Value Date/Time   CALCIUM 8.6* 02/06/2015 0520   ALKPHOS 47 02/06/2015 0520   AST 15 02/06/2015 0520   ALT 13* 02/06/2015 0520   BILITOT 0.7 02/06/2015 0520       RADIOGRAPHIC STUDIES: Ct Angio Chest Pe W/cm &/or Wo Cm  02/01/2015  CLINICAL DATA:  Tachycardia. EXAM: CT ANGIOGRAPHY CHEST WITH CONTRAST TECHNIQUE: Multidetector CT imaging of the chest was performed using the standard protocol during bolus administration of intravenous contrast. Multiplanar CT image reconstructions and MIPs were obtained to evaluate the vascular anatomy. CONTRAST:  53mL OMNIPAQUE IOHEXOL 350 MG/ML SOLN COMPARISON:  None FINDINGS: THORACIC INLET/BODY WALL: No acute abnormality. MEDIASTINUM: Cardiomegaly with particularly notable right atrial enlargement. There is mitral annular ossification. Atherosclerosis, including the coronary arteries. Limited opacification of the aorta without evidence of acute aortic disease. No pericardial effusion. Large main pulmonary arteries, with the right branch measuring 28 mm. This could reflect pulmonary hypertension. No evidence of pulmonary embolism. Large hiatal hernia. LUNG WINDOWS: Small pleural effusions with basilar atelectasis. No indication of pneumonia or edema. UPPER ABDOMEN: No acute findings. Low-density 3 cm mass in the left adrenal gland is stable from 2010 myelogram and consistent with adenoma. OSSEOUS: Severe right  glenohumeral arthropathy with joint effusion and degenerative cyst in the subscapular fossa. Severe and diffuse thoracic disc degeneration. No acute osseous finding. Review of the MIP images confirms the above findings. IMPRESSION: 1. Negative for pulmonary embolism. 2. Small pleural effusions and bibasilar atelectasis. 3. Large hiatal hernia. Electronically Signed   By: Monte Fantasia M.D.   On: 02/01/2015 11:31   Ct Abdomen Pelvis W Contrast  02/15/2015  ADDENDUM REPORT: 02/15/2015 16:31 ADDENDUM: The amount of contrast actually given for this exam: 100 mL OMNIPAQUE IOHEXOL 300 MG/ML SOLN Electronically Signed   By: Franki Cabot M.D.   On: 02/15/2015 16:31  02/15/2015  CLINICAL DATA:  Tachycardia, lower abdominal pain. Recently treated for urinary tract infection. History of colitis, diverticulosis and hiatal hernia. EXAM: CT ABDOMEN AND PELVIS WITH CONTRAST TECHNIQUE: Multidetector CT imaging of the abdomen and pelvis was performed using the standard protocol following bolus  administration of intravenous contrast. CONTRAST:  577mL OMNIPAQUE IOHEXOL 300 MG/ML  SOLN COMPARISON:  Previous exams including CT abdomen and pelvis dated 04/19/2009 and 08/29/2008. FINDINGS: Lower chest: There is mild bibasilar scarring/atelectasis and small bilateral pleural effusions. Moderate to large hiatal hernia is unchanged. Hepatobiliary: There is slightly increased intrahepatic and extrahepatic bile duct dilatation, common bile duct now dilated to approximately 14 mm (previously measured at 12.5 mm). No obstructing bile duct stone or bile duct mass identified. There is a new enhancing lesion within the inferior left hepatic lobe, likely segment 4B, measuring 2.7 x 1.5 cm, worrisome for metastasis. No other enhancing mass or lesion identified within the liver. Patient is status post cholecystectomy. Pancreas: No mass, inflammatory changes, or other significant abnormality. No significant pancreatic duct dilatation. Spleen:  There are numerous hypodense masses within the spleen, increased significantly in number compared to previous studies, and largest lesions have increased in size. Largest lesion within the posterior aspect of the spleen measures 2.8 x 2.7 cm (2.1 cm greatest dimension on the previous exam, and approximately 1.5 cm on the earlier exam of 08/29/2008). Next largest lesion within the anterior spleen measures 2.7 x 2.3 cm (measured at 2.4 cm greatest dimension on the previous exam). The increase in size and number suggests neoplastic process. Adrenals/Urinary Tract: Left adrenal mass is slightly decreased in prominence measuring 3.2 x 2.1 cm on today's exam (previously measured at 3.4 x 2.5 cm). Right adrenal gland is normal. Both kidneys are unremarkable without stone or hydronephrosis. No ureteral or bladder calculi identified. Bladder is unremarkable. Stomach/Bowel: Bowel is normal in caliber. Scattered diverticulosis noted within the descending and sigmoid colon without evidence of acute diverticulitis. Bowel is difficult to definitively characterize without oral contrast but there is no obvious evidence of bowel wall thickening or bowel wall inflammation. Appendix is not seen but there are no inflammatory changes about the cecum to suggest acute appendicitis. Vascular/Lymphatic: Scattered atherosclerotic changes again noted along the walls of the ectatic but normal-caliber abdominal aorta. No acute vascular abnormality seen. No enlarged lymph nodes identified in the abdomen or pelvis. Reproductive: No mass or other significant abnormality. Other: No free fluid or abscess collections seen. No free intraperitoneal air. Heart is at least mildly enlarged, incompletely imaged at the upper portion of this exam. Musculoskeletal: Degenerative changes are seen throughout the slightly scoliotic thoracolumbar spine but no acute osseous abnormality. No evidence of osseous metastasis seen. Evidence of a previous surgical  decompression of the central canal noted in the lower lumbar spine. IMPRESSION: 1. Increased number and size of the numerous splenic masses indicating neoplastic process, possibly lymphoma or metastasis as suggested on the previous exam. 2. Stable, or slightly smaller, left adrenal mass. This also remains suspicious for neoplastic process. 3. New enhancing lesion within the left hepatic lobe, segment 4B, measuring 2.8 x 1.5 cm. This is also suspicious for metastatic disease. 4. Mildly increased intrahepatic and extrahepatic bile duct dilatation, likely benign in nature and commensurate to the previous cholecystectomy. No bile duct stone seen. No obstructing mass seen. Could consider correlation with liver function tests. 5. Colonic diverticulosis without evidence of acute diverticulitis. 6. Fairly large amount of stool throughout the nondistended colon (constipation? ). No evidence of bowel obstruction or focal bowel wall inflammation. 7. Hiatal hernia, moderate to large in size, grossly stable compared to previous exams. 8. Other chronic/incidental findings detailed above. Electronically Signed: By: Franki Cabot M.D. On: 01/31/2015 15:28    ASSESSMENT: This is a very pleasant 82 years  old white female with suspicious splenic lesions that has been going on for several years but increased in number and size. She also has suspicious liver and adrenal lesions. This finding could be related to underlying lymphoma but doesn't neoplastic process cannot be secluded at this point.   PLAN: I had a lengthy discussion with the patient and her family today about her current condition and further investigation to confirm her diagnosis. I recommended for the patient to have a PET scan performed for evaluation of these splenic and liver lesions. If these lesions are hypermetabolic, I may consider the patient for ultrasound guided biopsy of the liver lesion or adrenal lesion if hypermetabolic. I will see her back for  follow-up visit in 3 weeks for reevaluation and more detailed discussion of her treatment options based on the PET scan results. The patient was advised to call immediately if she has any concerning symptoms in the interval. The patient voices understanding of current disease status and treatment options and is in agreement with the current care plan.  All questions were answered. The patient knows to call the clinic with any problems, questions or concerns. We can certainly see the patient much sooner if necessary.  Thank you so much for allowing me to participate in the care of Cathy [name]. I will continue to follow up the patient with you and assist in her care.  I spent 40 minutes counseling the patient face to face. The total time spent in the appointment was 60 minutes.  Disclaimer: This note was dictated with voice recognition software. Similar sounding words can inadvertently be transcribed and may not be corrected upon review.   Ashar Lewinski K. February 18, 2015, 5:10 PM

## 2015-03-10 NOTE — Discharge Instructions (Signed)
Electrical Cardioversion, Care After °Refer to this sheet in the next few weeks. These instructions provide you with information on caring for yourself after your procedure. Your health care provider may also give you more specific instructions. Your treatment has been planned according to current medical practices, but problems sometimes occur. Call your health care provider if you have any problems or questions after your procedure. °WHAT TO EXPECT AFTER THE PROCEDURE °After your procedure, it is typical to have the following sensations: °· Some redness on the skin where the shocks were delivered. If this is tender, a sunburn lotion or hydrocortisone cream may help. °· Possible return of an abnormal heart rhythm within hours or days after the procedure. °HOME CARE INSTRUCTIONS °· Take medicines only as directed by your health care provider. Be sure you understand how and when to take your medicine. °· Learn how to feel your pulse and check it often. °· Limit your activity for 48 hours after the procedure or as directed by your health care provider. °· Avoid or minimize caffeine and other stimulants as directed by your health care provider. °SEEK MEDICAL CARE IF: °· You feel like your heart is beating too fast or your pulse is not regular. °· You have any questions about your medicines. °· You have bleeding that will not stop. °SEEK IMMEDIATE MEDICAL CARE IF: °· You are dizzy or feel faint. °· It is hard to breathe or you feel short of breath. °· There is a change in discomfort in your chest. °· Your speech is slurred or you have trouble moving an arm or leg on one side of your body. °· You get a serious muscle cramp that does not go away. °· Your fingers or toes turn cold or blue. °  °This information is not intended to replace advice given to you by your health care provider. Make sure you discuss any questions you have with your health care provider. °  °Document Released: 12/06/2012 Document Revised: 03/08/2014  Document Reviewed: 12/06/2012 °Elsevier Interactive Patient Education ©2016 [employer] ° °

## 2015-03-10 NOTE — Transfer of Care (Signed)
Immediate Anesthesia Transfer of Care Note  Patient: Cathy [name]  Procedure(s) Performed: Procedure(s): CARDIOVERSION (N/A)  Patient Location: Endoscopy Unit  Anesthesia Type:MAC  Level of Consciousness: lethargic and responds to stimulation  Airway & Oxygen Therapy: Patient Spontanous Breathing  Post-op Assessment: Report given to RN  Post vital signs: Reviewed and stable  Last Vitals:  Filed Vitals:   03/10/15 1138 03/10/15 1139  BP:  108/44  Pulse: 41 41  Temp:    Resp: 16 14    Complications: No apparent anesthesia complications

## 2015-03-11 ENCOUNTER — Telehealth: Payer: Self-pay | Admitting: Internal Medicine

## 2015-03-11 ENCOUNTER — Encounter (HOSPITAL_COMMUNITY): Payer: Self-pay | Admitting: Cardiology

## 2015-03-11 ENCOUNTER — Ambulatory Visit (HOSPITAL_BASED_OUTPATIENT_CLINIC_OR_DEPARTMENT_OTHER): Payer: Medicare Other | Admitting: Internal Medicine

## 2015-03-11 VITALS — BP 120/43 | HR 40 | Temp 98.3°F | Resp 17 | Ht <= 58 in | Wt 124.0 lb

## 2015-03-11 DIAGNOSIS — D739 Disease of spleen, unspecified: Secondary | ICD-10-CM | POA: Diagnosis not present

## 2015-03-11 DIAGNOSIS — K769 Liver disease, unspecified: Secondary | ICD-10-CM

## 2015-03-11 DIAGNOSIS — R935 Abnormal findings on diagnostic imaging of other abdominal regions, including retroperitoneum: Secondary | ICD-10-CM

## 2015-03-11 NOTE — Telephone Encounter (Signed)
Gave patient/relative avs report and appointments for July.  °

## 2015-03-11 NOTE — Progress Notes (Signed)
[facility] Telephone:(336) [phone]   Fax:(336) [phone]  OFFICE PROGRESS NOTE  [name], MD [place] [address]  DIAGNOSIS: Liver and splenic lesions questionable for vascular phenomena in the liver and multiple splenic hemangiomas, lymphangiomas or cyst.  PRIOR THERAPY: None  CURRENT THERAPY: None  INTERVAL HISTORY: Cathy [name] 82 y.o. female returns to the clinic today for follow-up visit accompanied by her sister and daughter-in-law. The patient was seen 3 weeks ago for evaluation of liver and splenic lesions that were concerning for lymphoma. She had a PET scan performed recently that showed no hypermetabolic activity in the liver or the spleen and these lesions were questionable for vascular phenomena or multiple splenic hemangiomas versus lymphangiomas or cysts. The patient is feeling fine today except for generalized fatigue. She denied having any significant weight loss or night sweats. She has no nausea or vomiting. She denied having any significant chest pain but continues to have shortness breath with exertion with no cough or hemoptysis. She is here today for evaluation and discussion of her PET scan results.  MEDICAL HISTORY: Past Medical History  Diagnosis Date  . Hypertension   . Venous insufficiency   . Hypercholesteremia   . Hiatal hernia   . Dysphagia   . Diverticulosis of colon   . Irritable bowel syndrome   . Colitis     lymphocytic colitis feb 2011  . Urinary incontinence   . DJD (degenerative joint disease)   . Lumbar back pain   . Osteoporosis   . Anxiety   . Anemia   . Iron deficiency   . Vitamin B12 deficiency   . Allergic rhinitis   . Splenic lesion 02/18/2015  . Liver lesion 02/18/2015    ALLERGIES:  is allergic to amoxicillin-pot clavulanate and sulfonamide derivatives.  MEDICATIONS:  Current Outpatient Prescriptions  Medication Sig Dispense Refill  . amiodarone (PACERONE) 200 MG tablet Take 1  tablet (200 mg total) by mouth daily. 30 tablet 0  . apixaban (ELIQUIS) 2.5 MG TABS tablet Take 1 tablet (2.5 mg total) by mouth 2 (two) times daily. 60 tablet 0  . atorvastatin (LIPITOR) 20 MG tablet Take 1 tablet (20 mg total) by mouth daily at 6 PM. 30 tablet 0  . calcium-vitamin D (OSCAL WITH D) 500-200 MG-UNIT per tablet Take 1 tablet by mouth daily.     . cholecalciferol (VITAMIN D) 1000 UNITS tablet Take 1,000 Units by mouth daily.    . diclofenac (FLECTOR) 1.3 % PTCH Place 1 patch onto the skin every 12 (twelve) hours. Apply as directed by Dr. Brien Few    . docusate sodium (COLACE) 100 MG capsule Take 1 capsule (100 mg total) by mouth every 12 (twelve) hours. 60 capsule 0  . esomeprazole (NEXIUM) 40 MG capsule Take 1 capsule (40 mg total) by mouth daily. 90 capsule 3  . Ferrous Sulfate Dried (FEOSOL) 200 (65 FE) MG TABS Take 1 tablet by mouth 2 (two) times daily. Take with a vitamin c 500mg      . fexofenadine (ALLEGRA) 180 MG tablet Take 180 mg by mouth daily as needed for allergies or rhinitis.    . metoprolol tartrate (LOPRESSOR) 25 MG tablet Take 0.5 tablets (12.5 mg total) by mouth 2 (two) times daily. 45 tablet 3  . oxyCODONE-acetaminophen (PERCOCET) 5-325 MG per tablet Take 1 tablet by mouth every 6 (six) hours as needed for moderate pain.     . polyethylene glycol (MIRALAX /  GLYCOLAX) packet Take 17 g by mouth 2 (two) times daily. 28 each 0  . potassium chloride (K-DUR,KLOR-CON) 10 MEQ tablet Take 1 tablet (10 mEq total) by mouth daily. 30 tablet 0  . pregabalin (LYRICA) 50 MG capsule Take 1 capsule (50 mg total) by mouth 3 (three) times daily. 270 capsule 1  . PROLIA 60 MG/ML SOLN injection Inject 60 mg into the skin every 6 (six) months.     . solifenacin (VESICARE) 10 MG tablet Take 1 tablet (10 mg total) by mouth daily. 90 tablet 1  . spironolactone (ALDACTONE) 25 MG tablet Take 0.5 tablets (12.5 mg total) by mouth daily. 30 tablet 1  . vitamin B-12 (CYANOCOBALAMIN) 1000 MCG tablet  Take 1 tablet (1,000 mcg total) by mouth daily.     No current facility-administered medications for this visit.    SURGICAL HISTORY:  Past Surgical History  Procedure Laterality Date  . Cataract surgery    . Abdominal hysterectomy    . Decompressive lumbar laminectomy  06/2008    at L2-3, L3-4 and L4-5 by Dr. Ronnald Ramp  . Laparoscopic cholecystectomy  04/2009    Dr. Abran Cantor  . Replacement total knee    . Tee without cardioversion N/A 02/03/2015    Procedure: TRANSESOPHAGEAL ECHOCARDIOGRAM (TEE);  Surgeon: Sueanne Margarita, MD;  Location: [facility];  Service: Cardiovascular;  Laterality: N/A;  . Cardioversion N/A 02/03/2015    Procedure: CARDIOVERSION;  Surgeon: Sueanne Margarita, MD;  Location: [facility];  Service: Cardiovascular;  Laterality: N/A;  . Cardioversion N/A 03/10/2015    Procedure: CARDIOVERSION;  Surgeon: Dorothy Spark, MD;  Location: [facility];  Service: Cardiovascular;  Laterality: N/A;    REVIEW OF SYSTEMS:  A comprehensive review of systems was negative except for: Constitutional: positive for fatigue Respiratory: positive for dyspnea on exertion   PHYSICAL EXAMINATION: General appearance: alert, cooperative, fatigued and no distress Head: Normocephalic, without obvious abnormality, atraumatic Neck: no adenopathy, no JVD, supple, symmetrical, trachea midline and thyroid not enlarged, symmetric, no tenderness/mass/nodules Lymph nodes: Cervical, supraclavicular, and axillary nodes normal. Resp: clear to auscultation bilaterally Back: symmetric, no curvature. ROM normal. No CVA tenderness. Cardio: regular rate and rhythm, S1, S2 normal, no murmur, click, rub or gallop GI: soft, non-tender; bowel sounds normal; no masses,  no organomegaly Extremities: extremities normal, atraumatic, no cyanosis or edema  ECOG PERFORMANCE STATUS: 2 - Symptomatic, <50% confined to bed  Blood pressure 120/43, pulse 40, temperature 98.3 F (36.8 C), temperature source Oral, resp. rate  17, height 4\' 10"  (1.473 m), weight 124 lb (56.246 kg), SpO2 97 %.  LABORATORY DATA: Lab Results  Component Value Date   WBC 9.4 03/04/2015   HGB 12.4 03/04/2015   HCT 37.2 03/04/2015   MCV 93.2 03/04/2015   PLT 182 03/04/2015      Chemistry      Component Value Date/Time   NA 143 03/04/2015 1105   NA 143 02/21/2015 1140   K 5.3 03/04/2015 1105   K 4.7 02/21/2015 1140   CL 106 03/04/2015 1105   CO2 29 03/04/2015 1105   CO2 28 02/21/2015 1140   BUN 17 03/04/2015 1105   BUN 16.1 02/21/2015 1140   CREATININE 1.04* 03/04/2015 1105   CREATININE 1.0 02/21/2015 1140   CREATININE 0.69 02/06/2015 0520      Component Value Date/Time   CALCIUM 8.9 03/04/2015 1105   CALCIUM 9.3 02/21/2015 1140   ALKPHOS 72 03/04/2015 1105   ALKPHOS 58 02/21/2015 1140   AST 45* 03/04/2015  1105   AST 24 02/21/2015 1140   ALT 52* 03/04/2015 1105   ALT 32 02/21/2015 1140   BILITOT 0.5 03/04/2015 1105   BILITOT 0.57 02/21/2015 1140       RADIOGRAPHIC STUDIES: Nm Pet Image Initial (pi) Skull Base To Thigh  02/21/2015  CLINICAL DATA:  Initial treatment strategy for lymphoma. EXAM: NUCLEAR MEDICINE PET SKULL BASE TO THIGH TECHNIQUE: 6.2 mCi F-18 FDG was injected intravenously. Full-ring PET imaging was performed from the skull base to thigh after the radiotracer. CT data was obtained and used for attenuation correction and anatomic localization. FASTING BLOOD GLUCOSE:  Value: 74 mg/dl COMPARISON:  01/31/2015 FINDINGS: NECK No hypermetabolic lymph nodes in the neck. CHEST No hypermetabolic mediastinal or hilar nodes. Large hiatal hernia. The heart size is enlarged. There is aortic atherosclerosis. Calcification within the LAD coronary artery noted. There is dense mitral valve calcification. No hypermetabolic mediastinal or hilar lymph nodes identified. Small bilateral pleural effusions are present right greater than left. No hypermetabolic pulmonary nodule or mass noted. No suspicious pulmonary nodules on  the CT scan. ABDOMEN/PELVIS No abnormal hypermetabolic activity within the liver, pancreas, or spleen. No abnormal uptake is identified localizing to the hyperenhancing structure within segment 4 of the liver. The spleen measures 13.7 cm in length, 13.3 cm previously. There are no abnormal areas of increased uptake localizing to the multiple low-attenuation structures described on recent CT. The 3.2 cm nodule in the left adrenal gland exhibits mild increased uptake with an SUV max equal to 3.89. This was present on previous exam from 2011 and is not significantly changed. Likely benign adenoma. Aortic atherosclerosis noted. No hypermetabolic lymph nodes in the abdomen or pelvis. SKELETON No focal hypermetabolic activity to suggest skeletal metastasis. IMPRESSION: 1. No abnormal areas of hypermetabolism identified. 2. No abnormal uptake corresponding to the hyperattenuating structure seen within the liver on 01/31/2015. This likely reflects a benign vascular phenomenon/anomaly. If further imaging is clinically indicated a contrast enhanced liver protocol MRI would be the study of choice. 3. No abnormal areas of increased uptake within the spleen to correspond with the low-attenuation foci identified on recent CT. Allowing for differences in contrast technique, these appear similar to study from 04/19/2009. These are favored to represent a benign process such as multiple splenic hemangiomas, lymphangiomas or cyst. 4. Left adrenal nodule, stable from previous exam compatible with adenoma. 5. Large hiatal hernia 6. Cardiac enlargement, aortic atherosclerosis, coronary artery disease and bilateral pleural effusions. Electronically Signed   By: Kerby Moors M.D.   On: 02/21/2015 15:52    ASSESSMENT AND PLAN: This is a very pleasant 82 years old white female who was seen for evaluation of liver and splenic lesions and the recent PET scan showed no concerning findings and no hypermetabolic activity in these lesions.  The findings on the PET scan are more consistent with vascular phenomenon versus hemangiomas. I discussed the PET scan with the patient and her family today I recommended for her to continue on observation with close monitoring by her primary care physician. I would see her one more time in 6 months with repeat CBC, comprehensive metabolic panel and LDH. If no concerning findings at that time, I will discharge the patient to follow-up with her primary care physician. The patient was advised to call immediately if she has any concerning symptoms in the interval. The patient voices understanding of current disease status and treatment options and is in agreement with the current care plan.  All questions were answered. The  patient knows to call the clinic with any problems, questions or concerns. We can certainly see the patient much sooner if necessary.  Disclaimer: This note was dictated with voice recognition software. Similar sounding words can inadvertently be transcribed and may not be corrected upon review.

## 2015-03-17 ENCOUNTER — Encounter: Payer: Self-pay | Admitting: Physician Assistant

## 2015-03-17 ENCOUNTER — Ambulatory Visit (INDEPENDENT_AMBULATORY_CARE_PROVIDER_SITE_OTHER): Payer: Medicare Other | Admitting: Physician Assistant

## 2015-03-17 VITALS — BP 110/50 | HR 44 | Ht <= 58 in | Wt 123.0 lb

## 2015-03-17 DIAGNOSIS — I10 Essential (primary) hypertension: Secondary | ICD-10-CM

## 2015-03-17 DIAGNOSIS — I2510 Atherosclerotic heart disease of native coronary artery without angina pectoris: Secondary | ICD-10-CM | POA: Diagnosis not present

## 2015-03-17 DIAGNOSIS — I4892 Unspecified atrial flutter: Secondary | ICD-10-CM | POA: Diagnosis not present

## 2015-03-17 DIAGNOSIS — R001 Bradycardia, unspecified: Secondary | ICD-10-CM

## 2015-03-17 DIAGNOSIS — R945 Abnormal results of liver function studies: Secondary | ICD-10-CM

## 2015-03-17 DIAGNOSIS — R7989 Other specified abnormal findings of blood chemistry: Secondary | ICD-10-CM

## 2015-03-17 DIAGNOSIS — I5042 Chronic combined systolic (congestive) and diastolic (congestive) heart failure: Secondary | ICD-10-CM

## 2015-03-17 LAB — COMPREHENSIVE METABOLIC PANEL
ALK PHOS: 87 U/L (ref 33–130)
ALT: 63 U/L — AB (ref 6–29)
AST: 26 U/L (ref 10–35)
Albumin: 3.7 g/dL (ref 3.6–5.1)
BILIRUBIN TOTAL: 0.4 mg/dL (ref 0.2–1.2)
BUN: 26 mg/dL — AB (ref 7–25)
CO2: 27 mmol/L (ref 20–31)
CREATININE: 1.34 mg/dL — AB (ref 0.60–0.88)
Calcium: 8.9 mg/dL (ref 8.6–10.4)
Chloride: 106 mmol/L (ref 98–110)
GLUCOSE: 103 mg/dL — AB (ref 65–99)
Potassium: 5.7 mmol/L — ABNORMAL HIGH (ref 3.5–5.3)
SODIUM: 139 mmol/L (ref 135–146)
Total Protein: 5.9 g/dL — ABNORMAL LOW (ref 6.1–8.1)

## 2015-03-17 LAB — CBC
HEMATOCRIT: 33.4 % — AB (ref 36.0–46.0)
Hemoglobin: 11 g/dL — ABNORMAL LOW (ref 12.0–15.0)
MCH: 30.6 pg (ref 26.0–34.0)
MCHC: 32.9 g/dL (ref 30.0–36.0)
MCV: 93 fL (ref 78.0–100.0)
MPV: 11.5 fL (ref 8.6–12.4)
PLATELETS: 164 10*3/uL (ref 150–400)
RBC: 3.59 MIL/uL — ABNORMAL LOW (ref 3.87–5.11)
RDW: 13.8 % (ref 11.5–15.5)
WBC: 8.2 10*3/uL (ref 4.0–10.5)

## 2015-03-17 NOTE — Patient Instructions (Addendum)
Cathy Dunn, PA-C, has recommended making the following medication changes: STOP Metoprolol STOP Amiodarone **PLEASE HOLD the Spironolactone or Potassium until you hear from [place]  Your physician recommends that you return for lab work TODAY - CMP, CBC.  Please keep the appointment scheduled for Wednesday with Dr Curt Bears.

## 2015-03-17 NOTE — Progress Notes (Signed)
Cardiology Office Note Date:  03/17/2015  Patient ID:  Cathy [name] 05/01/1927, MRN [id number] PCP:  [name], MD  Cardiologist:  Dr. Tamala Julian   Chief Complaint: f/u atrial flutter/cardioversion  History of Present Illness: Cathy [name] is a 82 y.o. female with history of HTN, venous insufficiency, hypercholesterolemia, anemia, and recently diagnosed atrial flutter, combined CHF/LV dysfunction (EF 25-30%), and coronary artery calcification who presents for follow-up.   To recap recent history, she was admitted 01/2015 from urgent care after following up there for a UTI and being found to have atrial flutter 2:1 conduction. CTA 02/01/15 showed no PE but did show atherosclerosis of coronary arteries. TEE/DCCV was planned but DCCV was not performed due to inadequate visualization of the LA thrombus. She was started on amiodarone and Eliquis. 2D Echo 02/02/15: EF 25-30%, akinesis of mid-apical anteroseptal myocardium, grade 3 DD, mod MR, severely dilated LA, mildly reduced RV systolic function, mod dilated RA, mod TR, mildly increased PASP 55mmHg. TSH, free T4, troponin, Mg, Cr were all normal. LV dysfunction was felt tachy-related but Dr. Tamala Julian did recommend outpatient stress test to exclude ischemia and mentioned he would not cath unless high risk. The patient was seen back in follow-up 02/25/15 and outpatient DCCV was arranged. Metoprolol was decreased due to HR 50's and amiodarone was decreased to once daily. She underwent DCCV 03/10/15. Of note she was also evaluated in the interim by oncology due to evaluation of liver/splenic lesions concerning for lymphoma. She had a PET scan performed recently that showed no hypermetabolic activity in the liver or the spleen and these lesions were questionable for vascular phenomena or multiple splenic hemangiomas versus lymphangiomas or cysts. It was recommended she f/u with PCP. Recent CMET showed mild elevation in AST/ALT to 45/52 which was felt possibly 2/2  amiodarone, the dose of which was decreased.  She presents back today for follow-up. She has noticed worsening dyspnea on exertion. She had to stop twice on the way from the [employer] to our office. She also has noticed ever since hospitalization in 01/2015 she has developed a very slight jerking of her hands if she tries to pick something up. She falls asleep very easily when she sits down. She has not had any loss of consciousness, near-syncope or chest pain. HR is 37-44 in clinic today, going up to 62 with ambulation. Of note, HR at heme/onc visit on 03/11/15 was documented at 77. She has not had any LEE, orthopnea, or PND.    Past Medical History  Diagnosis Date  . Hypertension   . Venous insufficiency   . Hypercholesteremia   . Hiatal hernia   . Dysphagia   . Diverticulosis of colon   . Irritable bowel syndrome   . Colitis     lymphocytic colitis feb 2011  . Urinary incontinence   . DJD (degenerative joint disease)   . Lumbar back pain   . Osteoporosis   . Anxiety   . Anemia   . Iron deficiency   . Vitamin B12 deficiency   . Allergic rhinitis   . Splenic lesion 02/18/2015  . Liver lesion 02/18/2015  . Paroxysmal atrial flutter ([facility])     a. Dx 01/2015 - underwent DCCV 03/2015.  [name] Chronic systolic CHF (congestive heart failure) ([place])     a. 2D Echo 02/02/15: EF 25-30%, akinesis of mid-apical anteroseptal myocardium, grade 3 DD, mod MR, severely dilated LA, mildly reduced RV systoluc function, mod dilated RA, mod TR, mildly increased  PASP 106mmHg.  [name] Coronary artery calcification     a. By CT 01/2015.    Past Surgical History  Procedure Laterality Date  . Cataract surgery    . Abdominal hysterectomy    . Decompressive lumbar laminectomy  06/2008    at L2-3, L3-4 and L4-5 by Dr. Ronnald Ramp  . Laparoscopic cholecystectomy  04/2009    Dr. Abran Cantor  . Replacement total knee    . Tee without cardioversion N/A 02/03/2015    Procedure: TRANSESOPHAGEAL ECHOCARDIOGRAM (TEE);   Surgeon: Sueanne Margarita, MD;  Location: [facility] ENDOSCOPY;  Service: Cardiovascular;  Laterality: N/A;  . Cardioversion N/A 02/03/2015    Procedure: CARDIOVERSION;  Surgeon: Sueanne Margarita, MD;  Location: [facility] ENDOSCOPY;  Service: Cardiovascular;  Laterality: N/A;  . Cardioversion N/A 03/10/2015    Procedure: CARDIOVERSION;  Surgeon: Dorothy Spark, MD;  Location: [facility] ENDOSCOPY;  Service: Cardiovascular;  Laterality: N/A;    Current Outpatient Prescriptions  Medication Sig Dispense Refill  . apixaban (ELIQUIS) 2.5 MG TABS tablet Take 1 tablet (2.5 mg total) by mouth 2 (two) times daily. 60 tablet 0  . atorvastatin (LIPITOR) 20 MG tablet Take 1 tablet (20 mg total) by mouth daily at 6 PM. 30 tablet 0  . calcium-vitamin D (OSCAL WITH D) 500-200 MG-UNIT per tablet Take 1 tablet by mouth daily.     . cholecalciferol (VITAMIN D) 1000 UNITS tablet Take 1,000 Units by mouth daily.    . diclofenac (FLECTOR) 1.3 % PTCH Place 1 patch onto the skin every 12 (twelve) hours. Apply as directed by Dr. Brien Few    . docusate sodium (COLACE) 100 MG capsule Take 1 capsule (100 mg total) by mouth every 12 (twelve) hours. 60 capsule 0  . esomeprazole (NEXIUM) 40 MG capsule Take 1 capsule (40 mg total) by mouth daily. 90 capsule 3  . Ferrous Sulfate Dried (FEOSOL) 200 (65 FE) MG TABS Take 1 tablet by mouth 2 (two) times daily. Take with a vitamin c 500mg      . fexofenadine (ALLEGRA) 180 MG tablet Take 180 mg by mouth daily as needed for allergies or rhinitis.    [name] oxyCODONE-acetaminophen (PERCOCET) 5-325 MG per tablet Take 1 tablet by mouth every 6 (six) hours as needed for moderate pain.     . polyethylene glycol (MIRALAX / GLYCOLAX) packet Take 17 g by mouth 2 (two) times daily. 28 each 0  . potassium chloride (K-DUR,KLOR-CON) 10 MEQ tablet Take 1 tablet (10 mEq total) by mouth daily. 30 tablet 0  . pregabalin (LYRICA) 50 MG capsule Take 1 capsule (50 mg total) by mouth 3 (three) times daily. 270 capsule 1  . PROLIA 60  MG/ML SOLN injection Inject 60 mg into the skin every 6 (six) months.     . solifenacin (VESICARE) 10 MG tablet Take 1 tablet (10 mg total) by mouth daily. 90 tablet 1  . spironolactone (ALDACTONE) 25 MG tablet Take 0.5 tablets (12.5 mg total) by mouth daily. 30 tablet 1  . vitamin B-12 (CYANOCOBALAMIN) 1000 MCG tablet Take 1 tablet (1,000 mcg total) by mouth daily.     No current facility-administered medications for this visit.    Allergies:   Amoxicillin-pot clavulanate and Sulfonamide derivatives   Social History:  The patient  reports that she has quit smoking. Her smoking use included Cigarettes. She has a 5 pack-year smoking history. She has never used smokeless tobacco. She reports that she does not drink alcohol or use illicit drugs.   Family History:  The  patient's family history includes Diabetes in her brother; Heart attack in her brother and mother. There is no history of Hypertension, Fainting, or Stroke.  ROS:  Please see the history of present illness.  All other systems are reviewed and otherwise negative.   PHYSICAL EXAM:  VS:  BP 110/50 mmHg  Pulse 44  Ht 4\' 10"  (1.473 m)  Wt 123 lb (55.792 kg)  BMI 25.71 kg/m2 BMI: Body mass index is 25.71 kg/(m^2). Thin elderly WF in no acute distress - alert, awake, fully participatory in encounter HEENT: normocephalic, atraumatic Neck: no JVD, carotid bruits or masses Cardiac: reg rhythm but bradycardic, soft SEM, no rubs or gallops Lungs:  clear to auscultation bilaterally, no wheezing, rhonchi or rales Abd: soft, nontender, no hepatomegaly, + BS MS: no deformity or atrophy Ext: no edema Skin: warm and dry, no rash Neuro:  moves all extremities spontaneously, no focal abnormalities noted, follows commands Psych: euthymic mood, full affect   EKG:  Done today shows marked sinus bradycardia 37bpm with 1st degree AV block, NSIVCD, poor R wave progression, QRS 178ms, QTC 42ms  Recent Labs: 01/31/2015: TSH 1.303 02/01/2015: B  Natriuretic Peptide 421.8* 02/05/2015: Magnesium 1.9 03/04/2015: ALT 52*; BUN 17; Creat 1.04*; Hemoglobin 12.4; Platelets 182; Potassium 5.3; Sodium 143  No results found for requested labs within last 365 days.   Estimated Creatinine Clearance: 29.3 mL/min (by C-G formula based on Cr of 1.04).   Wt Readings from Last 3 Encounters:  03/17/15 123 lb (55.792 kg)  03/11/15 124 lb (56.246 kg)  03/10/15 117 lb (53.071 kg)     Other studies reviewed: Additional studies/records reviewed today include: summarized above  ASSESSMENT AND PLAN:  1. Atrial flutter s/p DCCV, now with marked sinus bradycardia - HR was recorded at 40 on 03/11/15 and is 37-44 in clinic today. This has corresponded with worsening dyspnea on exertion and fatigue. She is hemodynamically stable and has not had any syncope. Case discussed with Dr. Caryl Comes (DOD) - will stop metoprolol and amiodarone. He believes she may benefit from atrial flutter ablation. Will have the patient seen back in clinic later this week for a recheck of her HR and consultation with EP Dr. Curt Bears for further direction. I will also recheck potassium level today given spironolactone/potassium use. The patient says she was planning on resuming driving after today's visit but I've asked her to hold off until we can make sure she is stable from a cardiac standpoint. 2. Chronic combined CHF - appears euvolemic. Cardiomyopathy has been felt tachy-mediated but she has not had formal ischemic evaluation. See below. She is not on ACEI/ARB at present time - was started on spironolactone in the hospital instead. With her reported "jerking" motion, bradycardia, and recent uptrend in potassium level, will check labs today. I have asked her to hold off on further doses of spironolactone/potassium until we get these back.  3. Coronary artery calcification - hold off on decision of stress testing until HR more stable and decision is made about next intervention from EP  standpoint. 4. Essential HTN - follow BP for now with med changes. 5. Abnormal liver function tests - will recheck to trend.  Disposition: F/u with Dr. Curt Bears later this week for recheck HR and EP evaluation.  Current medicines are reviewed at length with the patient today.  The patient did not have any concerns regarding medicines.  Raechel Ache PA-C 03/17/2015 4:42 PM     [place]  [address] [phone] (office)  [phone] (fax)

## 2015-03-17 NOTE — Progress Notes (Signed)
I would favor low dose amio at 100 mg daily and d/c metoprolol before ablation in this 82 yo.

## 2015-03-18 ENCOUNTER — Telehealth: Payer: Self-pay

## 2015-03-18 DIAGNOSIS — I10 Essential (primary) hypertension: Secondary | ICD-10-CM

## 2015-03-18 NOTE — Telephone Encounter (Signed)
-----   Message from Charlie Pitter, PA-C sent at 03/18/2015  7:40 AM EST ----- Please let patient know potassium level was in fact high. She should not take any further spironolactone or potassium supplements. She should try to limit the amount of potassium in her diet for now. Keep f/u as planned. Please recheck BMET tomorrow when she comes in to see Dr. Curt Bears. Dayna Dunn PA-C

## 2015-03-18 NOTE — Telephone Encounter (Signed)
Informed patient of results and verbal understanding expressed.  Instructed patient to STOP SPIRONOLACTONE and POTASSIUM. Instructed patient to limit potassium in her diet and reviewed foods to avoid (avocado, bananas, spinach, yogurt, sweet potatoes, white beans).  Patient understands she will have repeat lab work at Dr. Macky Lower [facility] tomorrow.

## 2015-03-18 NOTE — Progress Notes (Signed)
Electrophysiology Office Note   Date:  03/19/2015   ID:  Cathy [name], DOB June 17, 1927, MRN [id number]  PCP:  [name], MD  Cardiologist:  Pernell Dupre Primary Electrophysiologist:  Cathy Bardon Meredith Leeds, MD    Chief Complaint  Patient presents with  . Fatigue     History of Present Illness: Cathy [name] is a 82 y.o. female who presents today for electrophysiology evaluation.   She has a history of HTN, venous insufficiency, hypercholesterolemia, anemia, and recently diagnosed atrial flutter, combined CHF/LV dysfunction (EF 25-30%), and coronary artery calcification.  To recap recent history, she was admitted 01/2015 from urgent care after following up there for a UTI and being found to have atrial flutter 2:1 conduction. CTA 02/01/15 showed no PE but did show atherosclerosis of coronary arteries. TEE/DCCV was planned but DCCV was not performed due to inadequate visualization of the LA thrombus. She was started on amiodarone and Eliquis. 2D Echo 02/02/15: EF 25-30%, akinesis of mid-apical anteroseptal myocardium, grade 3 DD, mod MR, severely dilated LA, mildly reduced RV systolic function, mod dilated RA, mod TR, mildly increased PASP 86mmHg. TSH, free T4, troponin, Mg, Cr were all normal. LV dysfunction was felt tachy-related but Dr. Tamala [name] did recommend outpatient stress test to exclude ischemia and mentioned he would not cath unless high risk. The patient was seen back in follow-up 02/25/15 and outpatient DCCV was arranged. Metoprolol was decreased due to HR 50's and amiodarone was decreased to once daily. She underwent DCCV 03/10/15. Of note she was also evaluated in the interim by oncology due to evaluation of liver/splenic lesions concerning for lymphoma. She had a PET scan performed recently that showed no hypermetabolic activity in the liver or the spleen and these lesions were questionable for vascular phenomena or multiple splenic hemangiomas versus lymphangiomas or cysts. It was recommended  she f/u with PCP. Recent CMET showed mild elevation in AST/ALT to 45/52 which was felt possibly 2/2 amiodarone, the dose of which was decreased.  2 days ago she presented to cardiology clinic with shortness of breath on exertion. She had 2 stop on her way into the office twice. At that time her metoprolol was stopped as well as her amiodarone. Since that time she says that she is felt that her with less shortness of breath weakness and fatigue.  Today, she denies symptoms of palpitations, chest pain, shortness of breath, orthopnea, PND, lower extremity edema, claudication, dizziness, presyncope, syncope, bleeding, or neurologic sequela. The patient is tolerating medications without difficulties and is otherwise without complaint today.    Past Medical History  Diagnosis Date  . Hypertension   . Venous insufficiency   . Hypercholesteremia   . Hiatal hernia   . Dysphagia   . Diverticulosis of colon   . Irritable bowel syndrome   . Colitis     lymphocytic colitis feb 2011  . Urinary incontinence   . DJD (degenerative joint disease)   . Lumbar back pain   . Osteoporosis   . Anxiety   . Anemia   . Iron deficiency   . Vitamin B12 deficiency   . Allergic rhinitis   . Splenic lesion 02/18/2015  . Liver lesion 02/18/2015  . Paroxysmal atrial flutter ([place])     a. Dx 01/2015 - underwent DCCV 03/2015.  [name] Chronic systolic CHF (congestive heart failure) ([place])     a. 2D Echo 02/02/15: EF 25-30%, akinesis of mid-apical anteroseptal myocardium, grade 3 DD, mod MR, severely dilated LA, mildly reduced RV systoluc function,  mod dilated RA, mod TR, mildly increased PASP 15mmHg.  [name] Coronary artery calcification     a. By CT 01/2015.  [name] Sinus bradycardia    Past Surgical History  Procedure Laterality Date  . Cataract surgery    . Abdominal hysterectomy    . Decompressive lumbar laminectomy  06/2008    at L2-3, L3-4 and L4-5 by Dr. Ronnald [name]  . Laparoscopic cholecystectomy  04/2009    Dr. Abran [name]  .  Replacement total knee    . Tee without cardioversion N/A 02/03/2015    Procedure: TRANSESOPHAGEAL ECHOCARDIOGRAM (TEE);  Surgeon: Cathy Margarita, MD;  Location: [facility] ENDOSCOPY;  Service: Cardiovascular;  Laterality: N/A;  . Cardioversion N/A 02/03/2015    Procedure: CARDIOVERSION;  Surgeon: Cathy Margarita, MD;  Location: [facility] ENDOSCOPY;  Service: Cardiovascular;  Laterality: N/A;  . Cardioversion N/A 03/10/2015    Procedure: CARDIOVERSION;  Surgeon: Cathy Spark, MD;  Location: [facility] ENDOSCOPY;  Service: Cardiovascular;  Laterality: N/A;     Current Outpatient Prescriptions  Medication Sig Dispense Refill  . apixaban (ELIQUIS) 2.5 MG TABS tablet Take 1 tablet (2.5 mg total) by mouth 2 (two) times daily. 180 tablet 2  . atorvastatin (LIPITOR) 20 MG tablet Take 1 tablet (20 mg total) by mouth daily at 6 PM. 30 tablet 0  . calcium-vitamin D (OSCAL WITH D) 500-200 MG-UNIT per tablet Take 1 tablet by mouth daily.     . cholecalciferol (VITAMIN D) 1000 UNITS tablet Take 1,000 Units by mouth daily.    . diclofenac (FLECTOR) 1.3 % PTCH Place 1 patch onto the skin every 12 (twelve) hours. Apply as directed by Dr. Brien Few    . docusate sodium (COLACE) 100 MG capsule Take 1 capsule (100 mg total) by mouth every 12 (twelve) hours. 60 capsule 0  . esomeprazole (NEXIUM) 40 MG capsule Take 1 capsule (40 mg total) by mouth daily. 90 capsule 3  . Ferrous Sulfate Dried (FEOSOL) 200 (65 FE) MG TABS Take 1 tablet by mouth 2 (two) times daily. Take with a vitamin c 500mg      . fexofenadine (ALLEGRA) 180 MG tablet Take 180 mg by mouth daily as needed for allergies or rhinitis.    [name] oxyCODONE-acetaminophen (PERCOCET) 5-325 MG per tablet Take 1 tablet by mouth every 6 (six) hours as needed for moderate pain.     . polyethylene glycol (MIRALAX / GLYCOLAX) packet Take 17 g by mouth 2 (two) times daily. 28 each 0  . pregabalin (LYRICA) 50 MG capsule Take 1 capsule (50 mg total) by mouth 3 (three) times daily. 270 capsule 1    . PROLIA 60 MG/ML SOLN injection Inject 60 mg into the skin every 6 (six) months.     . solifenacin (VESICARE) 10 MG tablet Take 1 tablet (10 mg total) by mouth daily. 90 tablet 1  . vitamin B-12 (CYANOCOBALAMIN) 1000 MCG tablet Take 1 tablet (1,000 mcg total) by mouth daily.     No current facility-administered medications for this visit.    Allergies:   Amoxicillin-pot clavulanate and Sulfonamide derivatives   Social History:  The patient  reports that she has quit smoking. Her smoking use included Cigarettes. She has a 5 pack-year smoking history. She has never used smokeless tobacco. She reports that she does not drink alcohol or use illicit drugs.   Family History:  The patient's family history includes Diabetes in her brother; Heart attack in her brother and mother. There is no history of Hypertension, Fainting, or Stroke.  ROS:  Please see the history of present illness.   All other systems are reviewed and negative.    PHYSICAL EXAM: VS:  BP 144/60 mmHg  Pulse 50  Ht 4\' 10"  (1.473 m)  Wt 126 lb 12.8 oz (57.516 kg)  BMI 26.51 kg/m2  SpO2 94% , BMI Body mass index is 26.51 kg/(m^2). GEN: Well nourished, well developed, in no acute distress HEENT: normal Neck: no JVD, carotid bruits, or masses Cardiac: bradycardic; no murmurs, rubs, or gallops,no edema  Respiratory:  clear to auscultation bilaterally, normal work of breathing GI: soft, nontender, nondistended, + BS MS: no deformity or atrophy Skin: warm and dry Neuro:  Strength and sensation are intact Psych: euthymic mood, full affect  EKG:  EKG is ordered today. The ekg ordered today shows sinus rhythm, nonspecific interventricular conduction delay, QRS 132    Recent Labs: 01/31/2015: TSH 1.303 02/01/2015: B Natriuretic Peptide 421.8* 02/05/2015: Magnesium 1.9 03/17/2015: ALT 63*; BUN 26*; Creat 1.34*; Hemoglobin 11.0*; Platelets 164; Potassium 5.7*; Sodium 139    Lipid Panel     Component Value Date/Time    CHOL 153 12/10/2013 1222   TRIG 53.0 12/10/2013 1222   HDL 67.00 12/10/2013 1222   CHOLHDL 2 12/10/2013 1222   VLDL 10.6 12/10/2013 1222   LDLCALC 75 12/10/2013 1222   LDLDIRECT 158.1 05/29/2008 1137     Wt Readings from Last 3 Encounters:  03/19/15 126 lb 12.8 oz (57.516 kg)  03/17/15 123 lb (55.792 kg)  03/11/15 124 lb (56.246 kg)      Other studies Reviewed: Additional studies/ records that were reviewed today include: TTE 02/02/15 Review of the above records today demonstrates:  - Left ventricle: The cavity size was normal. There was moderate concentric hypertrophy. Systolic function was severely reduced. The estimated ejection fraction was in the range of 25% to 30%. There is akinesis of the mid-apicalanteroseptal myocardium. Doppler parameters are consistent with a reversible restrictive pattern, indicative of decreased left ventricular diastolic compliance and/or increased left atrial pressure (grade 3 diastolic dysfunction). - Mitral valve: Moderately calcified annulus. There was moderate regurgitation. - Left atrium: The atrium was severely dilated. Volume/bsa, ES (1-plane Simpson&'s, A4C): 64.3 ml/m^2. - Right ventricle: Systolic function was mildly reduced. - Right atrium: The atrium was moderately dilated. - Tricuspid valve: There was moderate regurgitation. - Pulmonary arteries: Systolic pressure was mildly increased. PA peak pressure: 33 mm Hg (S).   ASSESSMENT AND PLAN:  1. Atrial flutter s/p DCCV, now with sinus bradycardia - HR was recorded at 0 on 03/11/15 and is 50 in clinic today. This has corresponded with worsening dyspnea on exertion and fatigue. She is hemodynamically stable and has not had any syncope. Both her metoprolol and amiodarone were stopped 2 days ago in clinic. Her heart rate is, into the 50s and she is having less symptoms. We Niani Mourer wait to see if she continues to have symptoms moving forward. As for the possibility of  ablation, she says that she would like to think about it and Leisl Spurrier call us back. 2. Chronic combined CHF - appears euvolemic. Cardiomyopathy has been felt tachy-mediated but she has not had formal ischemic evaluation. Krish Bailly have an evaluation after her atrial flutter and bradycardia is worked out. 3. Essential HTN - mildly elevated today, we'll continue to follow.  4. Abnormal liver function tests - Makenah Karas recheck to trend.    Current medicines are reviewed at length with the patient today.   The patient does not have concerns regarding her medicines.  The following changes were made today:  none  Labs/ tests ordered today include:  No orders of the defined types were placed in this encounter.     Disposition:   FU with Lakayla Barrington 3 months  Signed, Reynard Christoffersen Meredith Leeds, MD  03/19/2015 12:43 PM     [facility] [address] [place] [address] [phone] (office) [phone] (fax)

## 2015-03-19 ENCOUNTER — Other Ambulatory Visit (INDEPENDENT_AMBULATORY_CARE_PROVIDER_SITE_OTHER): Payer: Medicare Other | Admitting: *Deleted

## 2015-03-19 ENCOUNTER — Encounter: Payer: Self-pay | Admitting: Cardiology

## 2015-03-19 ENCOUNTER — Ambulatory Visit (INDEPENDENT_AMBULATORY_CARE_PROVIDER_SITE_OTHER): Payer: Medicare Other | Admitting: Cardiology

## 2015-03-19 VITALS — BP 144/60 | HR 50 | Ht <= 58 in | Wt 126.8 lb

## 2015-03-19 DIAGNOSIS — I10 Essential (primary) hypertension: Secondary | ICD-10-CM | POA: Diagnosis not present

## 2015-03-19 DIAGNOSIS — I483 Typical atrial flutter: Secondary | ICD-10-CM | POA: Diagnosis not present

## 2015-03-19 LAB — BASIC METABOLIC PANEL
BUN: 16 mg/dL (ref 7–25)
CO2: 25 mmol/L (ref 20–31)
Calcium: 9.4 mg/dL (ref 8.6–10.4)
Chloride: 107 mmol/L (ref 98–110)
Creat: 0.98 mg/dL — ABNORMAL HIGH (ref 0.60–0.88)
Glucose, Bld: 72 mg/dL (ref 65–99)
POTASSIUM: 4.9 mmol/L (ref 3.5–5.3)
SODIUM: 140 mmol/L (ref 135–146)

## 2015-03-19 MED ORDER — APIXABAN 2.5 MG PO TABS: 2.5000 mg | ORAL_TABLET | Freq: Two times a day (BID) | ORAL | Status: DC

## 2015-03-19 NOTE — Patient Instructions (Signed)
Medication Instructions:  Your physician recommends that you continue on your current medications as directed. Please refer to the Current Medication list given to you today.  Labwork: None ordered  Testing/Procedures: None ordered  Follow-Up: Your physician recommends that you schedule a follow-up appointment in: 3 months with Dr. Curt Bears.  If you need a refill on your cardiac medications before your next appointment, please call your pharmacy.  Thank you for choosing [facility]!!   Trinidad Curet, RN [phone]    Here is some information on ablation: Cardiac Ablation Cardiac ablation is a procedure to disable a small amount of heart tissue in very specific places. The heart has many electrical connections. Sometimes these connections are abnormal and can cause the heart to beat very fast or irregularly. By disabling some of the problem areas, heart rhythm can be improved or made normal. Ablation is done for people who:   Have Wolff-Parkinson-White syndrome.   Have other fast heart rhythms (tachycardia).   Have taken medicines for an abnormal heart rhythm (arrhythmia) that resulted in:   No success.   Side effects.   May have a high-risk heartbeat that could result in death.  LET [facility] CARE PROVIDER KNOW ABOUT:   Any allergies you have or any previous reactions you have had to X-ray dye, food (such as seafood), medicine, or tape.   All medicines you are taking, including vitamins, herbs, eye drops, creams, and over-the-counter medicines.   Previous problems you or members of your family have had with the use of anesthetics.   Any blood disorders you have.   Previous surgeries or procedures (such as a kidney transplant) you have had.   Medical conditions you have (such as kidney failure).  RISKS AND COMPLICATIONS Generally, cardiac ablation is a safe procedure. However, problems can occur and include:   Increased risk of cancer. Depending  on how long it takes to do the ablation, the dose of radiation can be high.  Bruising and bleeding where a thin, flexible tube (catheter) was inserted during the procedure.   Bleeding into the chest, especially into the sac that surrounds the heart (serious).  Need for a permanent pacemaker if the normal electrical system is damaged.   The procedure may not be fully effective, and this may not be recognized for months. Repeat ablation procedures are sometimes required. BEFORE THE PROCEDURE   Follow any instructions from your health care provider regarding eating and drinking before the procedure.   Take your medicines as directed at regular times with water, unless instructed otherwise by your health care provider. If you are taking diabetes medicine, including insulin, ask how you are to take it and if there are any special instructions you should follow. It is common to adjust insulin dosing the day of the ablation.  PROCEDURE  An ablation is usually performed in a catheterization laboratory with the guidance of fluoroscopy. Fluoroscopy is a type of X-ray that helps your health care provider see images of your heart during the procedure.   An ablation is a minimally invasive procedure. This means a small cut (incision) is made in either your neck or groin. Your health care provider will decide where to make the incision based on your medical history and physical exam.  An IV tube will be started before the procedure begins. You will be given an anesthetic or medicine to help you relax (sedative).  The skin on your neck or groin will be numbed. A needle will be inserted into  a large vein in your neck or groin and catheters will be threaded to your heart.  A special dye that shows up on fluoroscopy pictures may be injected through the catheter. The dye helps your health care provider see the area of the heart that needs treatment.  The catheter has electrodes on the tip. When the area  of heart tissue that is causing the arrhythmia is found, the catheter tip will send an electrical current to the area and "scar" the tissue. Three types of energy can be used to ablate the heart tissue:   Heat (radiofrequency energy).   Laser energy.   Extreme cold (cryoablation).   When the area of the heart has been ablated, the catheter will be taken out. Pressure will be held on the insertion site. This will help the insertion site clot and keep it from bleeding. A bandage will be placed on the insertion site.  AFTER THE PROCEDURE   After the procedure, you will be taken to a recovery area where your vital signs (blood pressure, heart rate, and breathing) will be monitored. The insertion site will also be monitored for bleeding.   You will need to lie still for 4-6 hours. This is to ensure you do not bleed from the catheter insertion site.    This information is not intended to replace advice given to you by your health care provider. Make sure you discuss any questions you have with your health care provider.   Document Released: 07/04/2008 Document Revised: 03/08/2014 Document Reviewed: 07/10/2012 Elsevier Interactive Patient Education [employer].

## 2015-03-19 NOTE — Addendum Note (Signed)
Addended by: Stanton Kidney on: 03/19/2015 12:46 PM   Modules accepted: Orders

## 2015-03-27 ENCOUNTER — Telehealth: Payer: Self-pay | Admitting: *Deleted

## 2015-03-27 NOTE — Telephone Encounter (Signed)
Called to check in with patient per Dr. Macky Lower request at [place] last week. Patient reports doing well.  States that DOE/fatigue is "better". States HR still running below 60. She would like to wait until f/u in April before making decision on ablation. Advised patient to call office if symptoms reappear/worsen again. She is agreeable to plan.

## 2015-04-02 ENCOUNTER — Other Ambulatory Visit: Payer: Self-pay | Admitting: Cardiology

## 2015-04-02 ENCOUNTER — Other Ambulatory Visit: Payer: Medicare Other

## 2015-04-02 ENCOUNTER — Encounter: Payer: Self-pay | Admitting: Internal Medicine

## 2015-04-02 ENCOUNTER — Ambulatory Visit (INDEPENDENT_AMBULATORY_CARE_PROVIDER_SITE_OTHER): Payer: Medicare Other | Admitting: Internal Medicine

## 2015-04-02 VITALS — BP 112/70 | HR 52 | Temp 98.6°F | Resp 20 | Ht <= 58 in | Wt 123.0 lb

## 2015-04-02 DIAGNOSIS — I5042 Chronic combined systolic (congestive) and diastolic (congestive) heart failure: Secondary | ICD-10-CM | POA: Diagnosis not present

## 2015-04-02 DIAGNOSIS — N3000 Acute cystitis without hematuria: Secondary | ICD-10-CM | POA: Diagnosis not present

## 2015-04-02 DIAGNOSIS — R309 Painful micturition, unspecified: Secondary | ICD-10-CM

## 2015-04-02 DIAGNOSIS — I10 Essential (primary) hypertension: Secondary | ICD-10-CM | POA: Diagnosis not present

## 2015-04-02 DIAGNOSIS — Z0000 Encounter for general adult medical examination without abnormal findings: Secondary | ICD-10-CM

## 2015-04-02 DIAGNOSIS — E7800 Pure hypercholesterolemia, unspecified: Secondary | ICD-10-CM

## 2015-04-02 DIAGNOSIS — N390 Urinary tract infection, site not specified: Secondary | ICD-10-CM | POA: Insufficient documentation

## 2015-04-02 LAB — POCT URINALYSIS DIPSTICK
Bilirubin, UA: NEGATIVE
KETONES UA: NEGATIVE
PH UA: 6.5
PROTEIN UA: NEGATIVE
SPEC GRAV UA: 1.025
UROBILINOGEN UA: NEGATIVE

## 2015-04-02 MED ORDER — NITROFURANTOIN MONOHYD MACRO 100 MG PO CAPS: 100.0000 mg | ORAL_CAPSULE | Freq: Two times a day (BID) | ORAL | Status: DC

## 2015-04-02 MED ORDER — ATORVASTATIN CALCIUM 20 MG PO TABS: 20.0000 mg | ORAL_TABLET | Freq: Every day | ORAL | Status: DC

## 2015-04-02 NOTE — Progress Notes (Signed)
Subjective:    Patient ID: Cathy [name], female    DOB: 24-Aug-1927, 82 y.o.   MRN: [id number]  HPI Here for wellness and f/u;  Overall doing ok;  Pt denies Chest pain, worsening SOB, DOE, wheezing, orthopnea, PND, worsening LE edema, palpitations, dizziness or syncope.  Pt denies neurological change such as new headache, facial or extremity weakness.  Pt denies polydipsia, polyuria, or low sugar symptoms. Pt states overall good compliance with treatment and medications, good tolerability, and has been trying to follow appropriate diet.  Pt denies worsening depressive symptoms, suicidal ideation or panic. No fever, night sweats, wt loss, loss of appetite, or other constitutional symptoms.  Pt states good ability with ADL's, has low fall risk, home safety reviewed and adequate, no other significant changes in hearing or vision, and only occasionally active with exercise.  Seeing Dr M/oncology, recent PET neg for active malignancy, after abnormal CT and recent slight elevated lft's.  Has hx of recurrent UTI - Has had mild 2-3 days urinary symptoms such as dysuria, frequency, but no urgency, flank pain, hematuria or n/v, fever, chills. No confusion, no falls. In good spirits today  Declines other lab today Past Medical History  Diagnosis Date  . Hypertension   . Venous insufficiency   . Hypercholesteremia   . Hiatal hernia   . Dysphagia   . Diverticulosis of colon   . Irritable bowel syndrome   . Colitis     lymphocytic colitis feb 2011  . Urinary incontinence   . DJD (degenerative joint disease)   . Lumbar back pain   . Osteoporosis   . Anxiety   . Anemia   . Iron deficiency   . Vitamin B12 deficiency   . Allergic rhinitis   . Splenic lesion 02/18/2015  . Liver lesion 02/18/2015  . Paroxysmal atrial flutter ([place])     a. Dx 01/2015 - underwent DCCV 03/2015.  [name] Chronic systolic CHF (congestive heart failure) ([place])     a. 2D Echo 02/02/15: EF 25-30%, akinesis of mid-apical anteroseptal  myocardium, grade 3 DD, mod MR, severely dilated LA, mildly reduced RV systoluc function, mod dilated RA, mod TR, mildly increased PASP 69mmHg.  [name] Coronary artery calcification     a. By CT 01/2015.  [name] Sinus bradycardia    Past Surgical History  Procedure Laterality Date  . Cataract surgery    . Abdominal hysterectomy    . Decompressive lumbar laminectomy  06/2008    at L2-3, L3-4 and L4-5 by Dr. Ronnald Ramp  . Laparoscopic cholecystectomy  04/2009    Dr. Abran Cantor  . Replacement total knee    . Tee without cardioversion N/A 02/03/2015    Procedure: TRANSESOPHAGEAL ECHOCARDIOGRAM (TEE);  Surgeon: Sueanne Margarita, MD;  Location: [facility] ENDOSCOPY;  Service: Cardiovascular;  Laterality: N/A;  . Cardioversion N/A 02/03/2015    Procedure: CARDIOVERSION;  Surgeon: Sueanne Margarita, MD;  Location: [facility] ENDOSCOPY;  Service: Cardiovascular;  Laterality: N/A;  . Cardioversion N/A 03/10/2015    Procedure: CARDIOVERSION;  Surgeon: Dorothy Spark, MD;  Location: [place];  Service: Cardiovascular;  Laterality: N/A;    reports that she has quit smoking. Her smoking use included Cigarettes. She has a 5 pack-year smoking history. She has never used smokeless tobacco. She reports that she does not drink alcohol or use illicit drugs. family history includes Diabetes in her brother; Heart attack in her brother and mother. There is no history of Hypertension, Fainting, or Stroke. Allergies  Allergen Reactions  .  Amoxicillin-Pot Clavulanate     REACTION: diarrhea  . Sulfonamide Derivatives Other (See Comments)    unknown   Current Outpatient Prescriptions on File Prior to Visit  Medication Sig Dispense Refill  . apixaban (ELIQUIS) 2.5 MG TABS tablet Take 1 tablet (2.5 mg total) by mouth 2 (two) times daily. 180 tablet 2  . calcium-vitamin D (OSCAL WITH D) 500-200 MG-UNIT per tablet Take 1 tablet by mouth daily.     . cholecalciferol (VITAMIN D) 1000 UNITS tablet Take 1,000 Units by mouth daily.    . diclofenac  (FLECTOR) 1.3 % PTCH Place 1 patch onto the skin every 12 (twelve) hours. Apply as directed by Dr. Brien Few    . docusate sodium (COLACE) 100 MG capsule Take 1 capsule (100 mg total) by mouth every 12 (twelve) hours. 60 capsule 0  . esomeprazole (NEXIUM) 40 MG capsule Take 1 capsule (40 mg total) by mouth daily. 90 capsule 3  . Ferrous Sulfate Dried (FEOSOL) 200 (65 FE) MG TABS Take 1 tablet by mouth 2 (two) times daily. Take with a vitamin c 500mg      . fexofenadine (ALLEGRA) 180 MG tablet Take 180 mg by mouth daily as needed for allergies or rhinitis.    [name] oxyCODONE-acetaminophen (PERCOCET) 5-325 MG per tablet Take 1 tablet by mouth every 6 (six) hours as needed for moderate pain.     . polyethylene glycol (MIRALAX / GLYCOLAX) packet Take 17 g by mouth 2 (two) times daily. 28 each 0  . pregabalin (LYRICA) 50 MG capsule Take 1 capsule (50 mg total) by mouth 3 (three) times daily. 270 capsule 1  . PROLIA 60 MG/ML SOLN injection Inject 60 mg into the skin every 6 (six) months.     . solifenacin (VESICARE) 10 MG tablet Take 1 tablet (10 mg total) by mouth daily. 90 tablet 1  . vitamin B-12 (CYANOCOBALAMIN) 1000 MCG tablet Take 1 tablet (1,000 mcg total) by mouth daily.     No current facility-administered medications on file prior to visit.   Review of Systems Constitutional: Negative for increased diaphoresis, other activity, appetite or siginficant weight change other than noted HENT: Negative for worsening hearing loss, ear pain, facial swelling, mouth sores and neck stiffness.   Eyes: Negative for other worsening pain, redness or visual disturbance.  Respiratory: Negative for shortness of breath and wheezing  Cardiovascular: Negative for chest pain and palpitations.  Gastrointestinal: Negative for diarrhea, blood in stool, abdominal distention or other pain Genitourinary: Negative for hematuria, flank pain or change in urine volume.  Musculoskeletal: Negative for myalgias or other joint  complaints.  Skin: Negative for color change and wound or drainage.  Neurological: Negative for syncope and numbness. other than noted Hematological: Negative for adenopathy. or other swelling Psychiatric/Behavioral: Negative for hallucinations, SI, self-injury, decreased concentration or other worsening agitation.      Objective:   Physical Exam BP 112/70 mmHg  Pulse 52  Temp(Src) 98.6 F (37 C) (Oral)  Resp 20  Ht 4\' 10"  (1.473 m)  Wt 123 lb (55.792 kg)  BMI 25.71 kg/m2  SpO2 95% VS noted,  Constitutional: Pt is oriented to person, place, and time. Appears well-developed and well-nourished, in no significant distress Head: Normocephalic and atraumatic.  Right Ear: External ear normal.  Left Ear: External ear normal.  Nose: Nose normal.  Mouth/Throat: Oropharynx is clear and moist.  Eyes: Conjunctivae and EOM are normal. Pupils are equal, round, and reactive to light.  Neck: Normal range of motion. Neck supple. No  JVD present. No tracheal deviation present or significant neck LA or mass Cardiovascular: Normal rate, regular rhythm, normal heart sounds and intact distal pulses.   Pulmonary/Chest: Effort normal and breath sounds without rales or wheezing  Abdominal: Soft. Bowel sounds are normal. NT. No HSM except for mild low mid abd tender, no guarding or rebound Musculoskeletal: Normal range of motion. Exhibits no edema.  Lymphadenopathy:  Has no cervical adenopathy.  Neurological: Pt is alert and oriented to person, place, and time. Pt has normal reflexes. No cranial nerve deficit. Motor grossly intact Skin: Skin is warm and dry. No rash noted.  Psychiatric:  Has normal mood and affect. Behavior is normal.   POCT Urinalysis Dipstick  Status: Finalresult Visible to patient:  Not Released Nextappt: 06/18/2015 at 11:45 AM in Cardiology (Will Meredith Leeds, MD) Dx:  Painful urination              Ref Range 2:10 PM (04/02/15) 76mo ago (01/31/15) 63mo  ago (01/31/15) 33mo ago (01/16/15)    Color, UA  yellow  yellow yellow    Clarity, UA  cloudy  clear hazy (A)    Glucose, UA  +- NEGATIVE negative negative    Bilirubin, UA  negative  negative negative    Ketones, UA  negative       Spec Grav, UA  1.025  1.015 1.020    Blood, UA  +-  negative moderate (A)    pH, UA  6.5  5.5 7.0    Protein, UA  negative  negative >=300 (A)    Urobilinogen, UA  negative  0.2 0.2    Nitrite, UA  postive  Negative Positive (A)    Leukocytes, UA Negative  large (3+) (A)               Assessment & Plan:

## 2015-04-02 NOTE — Assessment & Plan Note (Signed)
abormla udip with symptoms and several med allergies, for nitrofurantoin, check urine cx

## 2015-04-02 NOTE — Patient Instructions (Addendum)
Please take all new medication as prescribed - the antibiotic  Please continue all other medications as before, and refills have been done if requested.  Please have the pharmacy call with any other refills you may need.  Please continue your efforts at being more active, low cholesterol diet, and weight control.  You are otherwise up to date with prevention measures today.  Please keep your appointments with your specialists as you may have planned  The specimen has been sent for culture  You will be contacted by phone if any changes need to be made immediately.  Otherwise, you will receive a letter about your results with an explanation, but please check with MyChart first.  Please remember to sign up for MyChart if you have not done so, as this will be important to you in the future with finding out test results, communicating by private email, and scheduling acute appointments online when needed.  Please return in 6 months, or sooner if needed

## 2015-04-02 NOTE — Assessment & Plan Note (Signed)
stable overall by history and exam, recent data reviewed with pt, and pt to continue medical treatment as before,  to f/u any worsening symptoms or concerns Lab Results  Component Value Date   LDLCALC 75 12/10/2013

## 2015-04-02 NOTE — Assessment & Plan Note (Signed)
stable overall by history and exam, recent data reviewed with pt, and pt to continue medical treatment as before,  to f/u any worsening symptoms or concerns BP Readings from Last 3 Encounters:  04/02/15 112/70  03/19/15 144/60  03/17/15 110/50

## 2015-04-02 NOTE — Progress Notes (Signed)
Pre visit review using our clinic review tool, if applicable. No additional management support is needed unless otherwise documented below in the visit note. 

## 2015-04-02 NOTE — Assessment & Plan Note (Signed)

## 2015-04-02 NOTE — Assessment & Plan Note (Signed)
stable overall by history and exam, recent data reviewed with Cathy [name], and Cathy [name] to continue medical treatment as before,  to f/u any worsening symptoms or concerns Lab Results  Component Value Date   WBC 8.2 03/17/2015   HGB 11.0* 03/17/2015   HCT 33.4* 03/17/2015   PLT 164 03/17/2015   GLUCOSE 72 03/19/2015   CHOL 153 12/10/2013   TRIG 53.0 12/10/2013   HDL 67.00 12/10/2013   LDLDIRECT 158.1 05/29/2008   LDLCALC 75 12/10/2013   ALT 63* 03/17/2015   AST 26 03/17/2015   NA 140 03/19/2015   K 4.9 03/19/2015   CL 107 03/19/2015   CREATININE 0.98* 03/19/2015   BUN 16 03/19/2015   CO2 25 03/19/2015   TSH 1.303 01/31/2015   INR 1.33 03/09/2010

## 2015-04-03 NOTE — Telephone Encounter (Signed)
Ok to refill all of these under Dr Tamala Julian? Looks like potassium was stopped and patients lipids are usually followed by patients pcp, but these were last ordered by Cecilie Kicks. Please advise. Thanks, MI

## 2015-04-05 LAB — CULTURE, URINE COMPREHENSIVE

## 2015-04-13 ENCOUNTER — Ambulatory Visit (INDEPENDENT_AMBULATORY_CARE_PROVIDER_SITE_OTHER): Payer: Medicare Other | Admitting: Family Medicine

## 2015-04-13 ENCOUNTER — Ambulatory Visit (INDEPENDENT_AMBULATORY_CARE_PROVIDER_SITE_OTHER): Payer: Medicare Other

## 2015-04-13 VITALS — BP 116/72 | HR 57 | Temp 97.8°F | Resp 18 | Wt 121.4 lb

## 2015-04-13 DIAGNOSIS — M6740 Ganglion, unspecified site: Secondary | ICD-10-CM

## 2015-04-13 DIAGNOSIS — M25432 Effusion, left wrist: Secondary | ICD-10-CM | POA: Diagnosis not present

## 2015-04-13 NOTE — Patient Instructions (Signed)
Because you received an x-ray today, you will receive an invoice from [facility]. Please contact [facility] at [phone] with questions or concerns regarding your invoice. Our billing staff will not be able to assist you with those questions. °

## 2015-04-14 NOTE — Telephone Encounter (Signed)
Potassium was d/c. rqst for Atorvastatin  Should go to pt pcp. Not sure if pt should be on Amiodarone. Should fwd question to [facility] pt EP physician

## 2015-04-18 ENCOUNTER — Other Ambulatory Visit: Payer: Self-pay

## 2015-04-18 MED ORDER — ATORVASTATIN CALCIUM 20 MG PO TABS: 20.0000 mg | ORAL_TABLET | Freq: Every day | ORAL | Status: DC

## 2015-04-18 MED ORDER — PREGABALIN 50 MG PO CAPS: 50.0000 mg | ORAL_CAPSULE | Freq: Three times a day (TID) | ORAL | Status: DC

## 2015-04-21 ENCOUNTER — Telehealth: Payer: Self-pay | Admitting: Internal Medicine

## 2015-04-21 NOTE — Telephone Encounter (Signed)
Pt called in and said that [facility] not the the [facility]ill of her pregabalin (LYRICA) 50 MG capsule [id number] .  Can this be resent?

## 2015-04-22 MED ORDER — PREGABALIN 50 MG PO CAPS: 50.0000 mg | ORAL_CAPSULE | Freq: Three times a day (TID) | ORAL | Status: DC

## 2015-04-22 NOTE — Telephone Encounter (Signed)
Ok, Done hardcopy to [employer]

## 2015-04-22 NOTE — Telephone Encounter (Signed)
Medication printed signed and faxed to pharmacy  

## 2015-04-22 NOTE — Telephone Encounter (Signed)
Please advise, [facility] not [facility] patients prescription for lyrica, could we please send again

## 2015-04-23 ENCOUNTER — Telehealth: Payer: Self-pay | Admitting: Internal Medicine

## 2015-04-23 MED ORDER — PREGABALIN 50 MG PO CAPS: 50.0000 mg | ORAL_CAPSULE | Freq: Three times a day (TID) | ORAL | Status: DC

## 2015-04-23 NOTE — Telephone Encounter (Signed)
Pt's daughter called again stating [facility] still has not received the prescription for Lyrica They gave her the number [phone] and press option 3 for faxing instructions.

## 2015-04-23 NOTE — Telephone Encounter (Signed)
Mary,daughter, called request to speak to the assistant concern about medication. Please give her a call

## 2015-04-23 NOTE — Telephone Encounter (Signed)
Medication ly[facility] 02.22.2017

## 2015-04-23 NOTE — Telephone Encounter (Signed)
Called daughter she states [facility] states they did not get Lyrica rx that was suppose to been sent in. Inform Mary per chart it stated assistant did fax the rx. i will give [facility] a call 7 giver  Her a call back w/status. [facility] spoke with pharmacist gave her md authorization for Lyrica. Pharmacist states since med is a control can only give 6 months. Will update med list. Called daughter back inform her [facility] with [facility] gave rx verbally...Cathy [name]

## 2015-04-24 ENCOUNTER — Telehealth: Payer: Self-pay

## 2015-04-24 NOTE — Telephone Encounter (Signed)
PA initiated via [employer] [phone]

## 2015-04-25 ENCOUNTER — Other Ambulatory Visit: Payer: Self-pay | Admitting: *Deleted

## 2015-04-25 NOTE — Progress Notes (Signed)
Chief Complaint:  Chief Complaint  Patient presents with  . Cyst    Located on left wrist. Noticed on Thursday. No known injury to area    HPI: Cathy [name] is a 82 y.o. female who reports to [facility] today complaining of let wrist cyst, getting larger. NKI, nontneder, no n/w/t. Prior wrist injuries.   Past Medical History  Diagnosis Date  . Hypertension   . Venous insufficiency   . Hypercholesteremia   . Hiatal hernia   . Dysphagia   . Diverticulosis of colon   . Irritable bowel syndrome   . Colitis     lymphocytic colitis feb 2011  . Urinary incontinence   . DJD (degenerative joint disease)   . Lumbar back pain   . Osteoporosis   . Anxiety   . Anemia   . Iron deficiency   . Vitamin B12 deficiency   . Allergic rhinitis   . Splenic lesion 02/18/2015  . Liver lesion 02/18/2015  . Paroxysmal atrial flutter ([place])     a. Dx 01/2015 - underwent DCCV 03/2015.  [name] Chronic systolic CHF (congestive heart failure) ([place])     a. 2D Echo 02/02/15: EF 25-30%, akinesis of mid-apical anteroseptal myocardium, grade 3 DD, mod MR, severely dilated LA, mildly reduced RV systoluc function, mod dilated RA, mod TR, mildly increased PASP 19mmHg.  [name] Coronary artery calcification     a. By CT 01/2015.  [name] Sinus bradycardia    Past Surgical History  Procedure Laterality Date  . Cataract surgery    . Abdominal hysterectomy    . Decompressive lumbar laminectomy  06/2008    at L2-3, L3-4 and L4-5 by Dr. Ronnald Ramp  . Laparoscopic cholecystectomy  04/2009    Dr. Abran Cantor  . Replacement total knee    . Tee without cardioversion N/A 02/03/2015    Procedure: TRANSESOPHAGEAL ECHOCARDIOGRAM (TEE);  Surgeon: Sueanne Margarita, MD;  Location: [facility] ENDOSCOPY;  Service: Cardiovascular;  Laterality: N/A;  . Cardioversion N/A 02/03/2015    Procedure: CARDIOVERSION;  Surgeon: Sueanne Margarita, MD;  Location: [facility] ENDOSCOPY;  Service: Cardiovascular;  Laterality: N/A;  . Cardioversion N/A 03/10/2015    Procedure:  CARDIOVERSION;  Surgeon: Dorothy Spark, MD;  Location: [facility] ENDOSCOPY;  Service: Cardiovascular;  Laterality: N/A;   Social History   Social History  . Marital Status: Widowed    Spouse Name: N/A  . Number of Children: N/A  . Years of Education: N/A   Occupational History  . retired    Social History Main Topics  . Smoking status: Former Smoker -- 0.50 packs/day for 10 years    Types: Cigarettes  . Smokeless tobacco: Never Used  . Alcohol Use: No  . Drug Use: No  . Sexual Activity: No   Other Topics Concern  . None   Social History Narrative   Family History  Problem Relation Age of Onset  . Heart attack Mother   . Heart attack Brother   . Hypertension Neg Hx   . Fainting Neg Hx   . Diabetes Brother   . Stroke Neg Hx    Allergies  Allergen Reactions  . Amoxicillin-Pot Clavulanate     REACTION: diarrhea  . Sulfonamide Derivatives Other (See Comments)    unknown   Prior to Admission medications   Medication Sig Start Date End Date Taking? Authorizing Provider  apixaban (ELIQUIS) 2.5 MG TABS tablet Take 1 tablet (2.5 mg total) by mouth 2 (two) times daily. 03/19/15  Yes Will  Meredith Leeds, MD  calcium-vitamin D (OSCAL WITH D) 500-200 MG-UNIT per tablet Take 1 tablet by mouth daily.    Yes Historical Provider, MD  cholecalciferol (VITAMIN D) 1000 UNITS tablet Take 1,000 Units by mouth daily.   Yes Historical Provider, MD  diclofenac (FLECTOR) 1.3 % PTCH Place 1 patch onto the skin every 12 (twelve) hours. Apply as directed by Dr. Brien Few   Yes Historical Provider, MD  docusate sodium (COLACE) 100 MG capsule Take 1 capsule (100 mg total) by mouth every 12 (twelve) hours. 10/22/14  Yes April Palumbo, MD  esomeprazole (NEXIUM) 40 MG capsule Take 1 capsule (40 mg total) by mouth daily. 07/23/14  Yes Rowe Clack, MD  Ferrous Sulfate Dried (FEOSOL) 200 (65 FE) MG TABS Take 1 tablet by mouth 2 (two) times daily. Take with a vitamin c 500mg     Yes Historical Provider, MD    fexofenadine (ALLEGRA) 180 MG tablet Take 180 mg by mouth daily as needed for allergies or rhinitis.   Yes Historical Provider, MD  nitrofurantoin, macrocrystal-monohydrate, (MACROBID) 100 MG capsule Take 1 capsule (100 mg total) by mouth 2 (two) times daily. 04/02/15  Yes Biagio Borg, MD  oxyCODONE-acetaminophen (PERCOCET) 5-325 MG per tablet Take 1 tablet by mouth every 6 (six) hours as needed for moderate pain.    Yes Historical Provider, MD  polyethylene glycol (MIRALAX / GLYCOLAX) packet Take 17 g by mouth 2 (two) times daily. 10/22/14  Yes April Palumbo, MD  PROLIA 60 MG/ML SOLN injection Inject 60 mg into the skin every 6 (six) months.  03/19/14  Yes Historical Provider, MD  solifenacin (VESICARE) 10 MG tablet Take 1 tablet (10 mg total) by mouth daily. 06/17/14  Yes Rowe Clack, MD  vitamin B-12 (CYANOCOBALAMIN) 1000 MCG tablet Take 1 tablet (1,000 mcg total) by mouth daily. 06/17/14  Yes Rowe Clack, MD  atorvastatin (LIPITOR) 20 MG tablet Take 1 tablet (20 mg total) by mouth daily at 6 PM. 04/18/15   Biagio Borg, MD  pregabalin (LYRICA) 50 MG capsule Take 1 capsule (50 mg total) by mouth 3 (three) times daily. 04/23/15   Biagio Borg, MD     ROS: The patient denies fevers, chills, night sweats, unintentional weight loss, chest pain, palpitations, wheezing, dyspnea on exertion, nausea, vomiting, abdominal pain, dysuria, hematuria, melena, numbness, weakness, or tingling.   All other systems have been reviewed and were otherwise negative with the exception of those mentioned in the HPI and as above.    PHYSICAL EXAM: Filed Vitals:   04/13/15 1330  BP: 116/72  Pulse: 57  Temp: 97.8 F (36.6 C)  Resp: 18   Body mass index is 25.38 kg/(m^2).   General: Alert, no acute distress HEENT:  Normocephalic, atraumatic, oropharynx patent. EOMI, PERRLA Cardiovascular:  Regular rate and rhythm, no rubs or gallops.   Respiratory: Clear to auscultation bilaterally.  No wheezes,  rales, or rhonchi.  No cyanosis, no use of accessory musculature Abdominal: No organomegaly, abdomen is soft and non-tender, positive bowel sounds. No masses. Skin: Left wrist ganglion cyst Neurologic: Facial musculature symmetric. Psychiatric: Patient acts appropriately throughout our interaction. Lymphatic: No cervical or submandibular lymphadenopathy Musculoskeletal: Gait intact with cane.  + left wrist, +moveable cysts,  edema, nontender , 1 inch by 1 inch  Decrease ROM, sensation intact radial pulse intact   LABS:    EKG/XRAY:   IMPRESSION: Evidence of prior trauma involving portions of the distal radius and ulna with remodeling. Extensive osteoarthritic change  at multiple sites, most marked in the proximal carpal row region in first carpal -metacarpal joints. No erosive change.   Electronically Signed  By: Lowella Grip III M.D.  On: 04/13/2015 14:40   ASSESSMENT/PLAN: Encounter Diagnoses  Name Primary?  . Wrist swelling, left Yes  . Ganglion cyst    Refer to ortho She can see [place] hand surgeon, for either aspiration or removal prn  Xrays were negative   Gross sideeffects, risk and benefits, and alternatives of medications d/w patient. Patient is aware that all medications have potential sideeffects and we are unable to predict every sideeffect or drug-drug interaction that may occur.  Asanti Craigo DO  04/25/2015 7:38 AM

## 2015-04-29 NOTE — Telephone Encounter (Signed)
Pt not covered by plan.  New PA initiated [facility]), key [phone]

## 2015-04-29 NOTE — Telephone Encounter (Signed)
Daughter left msg on triage requesting PA status...Johny Chess

## 2015-04-29 NOTE — Telephone Encounter (Signed)
Message left for pt's daughter informing of PA status and that we are waiting for MD to return to the office for an alternative. Daughter asked to call me back if any further questions

## 2015-04-29 NOTE — Telephone Encounter (Signed)
PA denied, please advise on alternative medication

## 2015-05-02 ENCOUNTER — Other Ambulatory Visit: Payer: Self-pay

## 2015-05-02 NOTE — Telephone Encounter (Signed)
Received call from pt daughter [facility]) stating [facility] and stating that they have not heard back from MD office pertaining to the PA for Lyrica. Inform daughter per chart a PA was submitted and PA was denied. Daughter states mom really need medicine this is the only thing works for her. Inform daughter i will contact [facility] to check ststua on what they are needing since they received a call yesterday. [facility] [phone] spoke w/Kaleb he stated that the PA was still open not sure why on cover-my-meds med was denied. Completed PA over the phone w/Kaleb gave clinical inform ation that was needed. Dx Peripheral Neuropathy pt been taking since 12/10/13. Received approval for 1 year from 05/02/15-05/01/16. Case ID [id number].../LMB   Called daughter back gave her status on PA med was approved, also gave her case ID for approval .../lmb

## 2015-05-02 NOTE — Telephone Encounter (Signed)
Received fax from [place] that PA was approved until 05/01/2016.  Marland Kitchenlvm with pt dtr with details that rx PA was approved.  Spoke to [employer] (female) at [employer]

## 2015-05-07 ENCOUNTER — Telehealth: Payer: Self-pay | Admitting: Pharmacist

## 2015-05-07 NOTE — Telephone Encounter (Signed)
Received fax from [facility] that pt is having a lumbar spinal injection by Dr. Brien Few. Pt on Eliquis for afib, no history of stroke. Ok to hold Eliquis x3 days prior to spinal injection. Clearance faxed to [phone].

## 2015-06-01 ENCOUNTER — Ambulatory Visit (INDEPENDENT_AMBULATORY_CARE_PROVIDER_SITE_OTHER): Payer: Medicare Other | Admitting: Family Medicine

## 2015-06-01 VITALS — BP 116/70 | HR 64 | Temp 98.2°F | Resp 17 | Ht <= 58 in | Wt 125.0 lb

## 2015-06-01 DIAGNOSIS — H9203 Otalgia, bilateral: Secondary | ICD-10-CM

## 2015-06-01 DIAGNOSIS — J029 Acute pharyngitis, unspecified: Secondary | ICD-10-CM

## 2015-06-01 LAB — POCT RAPID STREP A (OFFICE): Rapid Strep A Screen: NEGATIVE

## 2015-06-01 NOTE — Patient Instructions (Addendum)
Drink plenty of fluids and get enough rest.   Take Tylenol (acetaminophen) maximum of 1000 mg (500 mg 2) 3 times daily if needed for discomfort. This will help the throat pain and ear discomfort.  Use the fluticasone (Flonase) nose spray 2 sprays each nostril twice daily for 3 days, then decrease to once daily  Continue using the Mucinex (guaifenesin) as needed to try and thin the secretions, and the Delsym if necessary for cough.  If you start bringing up a a lot of purulent looking mucus or are getting significantly worse symptoms or high fever or coughing worse or shortness of breath please get rechecked  Return as needed to Dr. Jenny Reichmann or here if needed.  The way this virus has been doing, I would expect you to get feeling a little worse over the next couple of days and then gradually start feeling better over the week.  Upper Respiratory Infection, Adult Most upper respiratory infections (URIs) are a viral infection of the air passages leading to the lungs. A URI affects the nose, throat, and upper air passages. The most common type of URI is nasopharyngitis and is typically referred to as "the common cold." URIs run their course and usually go away on their own. Most of the time, a URI does not require medical attention, but sometimes a bacterial infection in the upper airways can follow a viral infection. This is called a secondary infection. Sinus and middle ear infections are common types of secondary upper respiratory infections. Bacterial pneumonia can also complicate a URI. A URI can worsen asthma and chronic obstructive pulmonary disease (COPD). Sometimes, these complications can require emergency medical care and may be life threatening.  CAUSES Almost all URIs are caused by viruses. A virus is a type of germ and can spread from one person to another.  RISKS FACTORS You may be at risk for a URI if:   You smoke.   You have chronic heart or lung disease.  You have a weakened  defense (immune) system.   You are very young or very old.   You have nasal allergies or asthma.  You work in crowded or poorly ventilated areas.  You work in health care facilities or schools. SIGNS AND SYMPTOMS  Symptoms typically develop 2-3 days after you come in contact with a cold virus. Most viral URIs last 7-10 days. However, viral URIs from the influenza virus (flu virus) can last 14-18 days and are typically more severe. Symptoms may include:   Runny or stuffy (congested) nose.   Sneezing.   Cough.   Sore throat.   Headache.   Fatigue.   Fever.   Loss of appetite.   Pain in your forehead, behind your eyes, and over your cheekbones (sinus pain).  Muscle aches.  DIAGNOSIS  Your health care provider may diagnose a URI by:  Physical exam.  Tests to check that your symptoms are not due to another condition such as:  Strep throat.  Sinusitis.  Pneumonia.  Asthma. TREATMENT  A URI goes away on its own with time. It cannot be cured with medicines, but medicines may be prescribed or recommended to relieve symptoms. Medicines may help:  Reduce your fever.  Reduce your cough.  Relieve nasal congestion. HOME CARE INSTRUCTIONS   Take medicines only as directed by your health care provider.   Gargle warm saltwater or take cough drops to comfort your throat as directed by your health care provider.  Use a warm mist humidifier or inhale  steam from a shower to increase air moisture. This may make it easier to breathe.  Drink enough fluid to keep your urine clear or pale yellow.   Eat soups and other clear broths and maintain good nutrition.   Rest as needed.   Return to work when your temperature has returned to normal or as your health care provider advises. You may need to stay home longer to avoid infecting others. You can also use a face mask and careful hand washing to prevent spread of the virus.  Increase the usage of your inhaler if  you have asthma.   Do not use any tobacco products, including cigarettes, chewing tobacco, or electronic cigarettes. If you need help quitting, ask your health care provider. PREVENTION  The best way to protect yourself from getting a cold is to practice good hygiene.   Avoid oral or hand contact with people with cold symptoms.   Wash your hands often if contact occurs.  There is no clear evidence that vitamin C, vitamin E, echinacea, or exercise reduces the chance of developing a cold. However, it is always recommended to get plenty of rest, exercise, and practice good nutrition.  SEEK MEDICAL CARE IF:   You are getting worse rather than better.   Your symptoms are not controlled by medicine.   You have chills.  You have worsening shortness of breath.  You have brown or red mucus.  You have yellow or brown nasal discharge.  You have pain in your face, especially when you bend forward.  You have a fever.  You have swollen neck glands.  You have pain while swallowing.  You have white areas in the back of your throat. SEEK IMMEDIATE MEDICAL CARE IF:   You have severe or persistent:  Headache.  Ear pain.  Sinus pain.  Chest pain.  You have chronic lung disease and any of the following:  Wheezing.  Prolonged cough.  Coughing up blood.  A change in your usual mucus.  You have a stiff neck.  You have changes in your:  Vision.  Hearing.  Thinking.  Mood. MAKE SURE YOU:   Understand these instructions.  Will watch your condition.  Will get help right away if you are not doing well or get worse.   This information is not intended to replace advice given to you by your health care provider. Make sure you discuss any questions you have with your health care provider.   Document Released: 08/11/2000 Document Revised: 07/02/2014 Document Reviewed: 05/23/2013 Elsevier Interactive Patient Education 2016 [employer].     IF you received an  x-ray today, you will receive an invoice from [facility]. Please contact [facility] at [phone] with questions or concerns regarding your invoice.   IF you received labwork today, you will receive an invoice from [employer]. Please contact [facility] at [phone] with questions or concerns regarding your invoice.   Our billing staff will not be able to assist you with questions regarding bills from these companies.  You will be contacted with the lab results as soon as they are available. The fastest way to get your results is to activate your My Chart account. Instructions are located on the last page of this paperwork. If you have not heard from [place] regarding the results in 2 weeks, please contact this office.

## 2015-06-01 NOTE — Progress Notes (Signed)
Patient ID: Cathy [name], female    DOB: 1927/08/07  Age: 82 y.o. MRN: [id number]  Chief Complaint  Patient presents with  . Sore Throat    Subjective:   82 year old lady who is here with a sore throat. This developed yesterday evening after she taken a nap. She has discomfort up in the sides of her neck and feels like it's going in her ears. She does not have any fevers. She has had some congestion especially in the right side of the nose. She is not coughing. She did have her flu shot this year. She is 82 years old and lives alone, functions well. She has a regular physician, Dr. Jenny Reichmann, who is closed today.  Current allergies, medications, problem list, past/family and social histories reviewed.  Objective:  BP 116/70 mmHg  Pulse 64  Temp(Src) 98.2 F (36.8 C) (Oral)  Resp 17  Ht 4\' 9"  (1.448 m)  Wt 125 lb (56.7 kg)  BMI 27.04 kg/m2  SpO2 95%  No major acute distress. Her TMs are normal. Throat mildly erythematous without exudate. Neck supple without significant nodes. Chest is clear to auscultation. Heart has a grade 2/6 systolic murmur, probably of aortic origin.  Assessment & Plan:   Assessment: 1. Sore throat   2. Otalgia, bilateral       Plan: Pharyngitis, rule out strep. Probably is a start of one of the cold viruses that we have been seeing frequently the last couple of days.  Orders Placed This Encounter  Procedures  . POCT rapid strep A    No orders of the defined types were placed in this encounter.    Results for orders placed or performed in visit on 06/01/15  POCT rapid strep A  Result Value Ref Range   Rapid Strep A Screen Negative Negative        Patient Instructions   Drink plenty of fluids and get enough rest.   Take Tylenol (acetaminophen) maximum of 1000 mg (500 mg 2) 3 times daily if needed for discomfort. This will help the throat pain and ear discomfort.  Use the fluticasone (Flonase) nose spray 2 sprays each nostril twice daily for  3 days, then decrease to once daily  Continue using the Mucinex (guaifenesin) as needed to try and thin the secretions, and the Delsym if necessary for cough.  If you start bringing up a a lot of purulent looking mucus or are getting significantly worse symptoms or high fever or coughing worse or shortness of breath please get rechecked  Return as needed to Dr. Jenny Reichmann or here if needed.  The way this virus has been doing, I would expect you to get feeling a little worse over the next couple of days and then gradually start feeling better over the week.  Upper Respiratory Infection, Adult Most upper respiratory infections (URIs) are a viral infection of the air passages leading to the lungs. A URI affects the nose, throat, and upper air passages. The most common type of URI is nasopharyngitis and is typically referred to as "the common cold." URIs run their course and usually go away on their own. Most of the time, a URI does not require medical attention, but sometimes a bacterial infection in the upper airways can follow a viral infection. This is called a secondary infection. Sinus and middle ear infections are common types of secondary upper respiratory infections. Bacterial pneumonia can also complicate a URI. A URI can worsen asthma and chronic obstructive pulmonary disease (COPD). Sometimes,  these complications can require emergency medical care and may be life threatening.  CAUSES Almost all URIs are caused by viruses. A virus is a type of germ and can spread from one person to another.  RISKS FACTORS You may be at risk for a URI if:   You smoke.   You have chronic heart or lung disease.  You have a weakened defense (immune) system.   You are very young or very old.   You have nasal allergies or asthma.  You work in crowded or poorly ventilated areas.  You work in health care facilities or schools. SIGNS AND SYMPTOMS  Symptoms typically develop 2-3 days after you come in  contact with a cold virus. Most viral URIs last 7-10 days. However, viral URIs from the influenza virus (flu virus) can last 14-18 days and are typically more severe. Symptoms may include:   Runny or stuffy (congested) nose.   Sneezing.   Cough.   Sore throat.   Headache.   Fatigue.   Fever.   Loss of appetite.   Pain in your forehead, behind your eyes, and over your cheekbones (sinus pain).  Muscle aches.  DIAGNOSIS  Your health care provider may diagnose a URI by:  Physical exam.  Tests to check that your symptoms are not due to another condition such as:  Strep throat.  Sinusitis.  Pneumonia.  Asthma. TREATMENT  A URI goes away on its own with time. It cannot be cured with medicines, but medicines may be prescribed or recommended to relieve symptoms. Medicines may help:  Reduce your fever.  Reduce your cough.  Relieve nasal congestion. HOME CARE INSTRUCTIONS   Take medicines only as directed by your health care provider.   Gargle warm saltwater or take cough drops to comfort your throat as directed by your health care provider.  Use a warm mist humidifier or inhale steam from a shower to increase air moisture. This may make it easier to breathe.  Drink enough fluid to keep your urine clear or pale yellow.   Eat soups and other clear broths and maintain good nutrition.   Rest as needed.   Return to work when your temperature has returned to normal or as your health care provider advises. You may need to stay home longer to avoid infecting others. You can also use a face mask and careful hand washing to prevent spread of the virus.  Increase the usage of your inhaler if you have asthma.   Do not use any tobacco products, including cigarettes, chewing tobacco, or electronic cigarettes. If you need help quitting, ask your health care provider. PREVENTION  The best way to protect yourself from getting a cold is to practice good hygiene.    Avoid oral or hand contact with people with cold symptoms.   Wash your hands often if contact occurs.  There is no clear evidence that vitamin C, vitamin E, echinacea, or exercise reduces the chance of developing a cold. However, it is always recommended to get plenty of rest, exercise, and practice good nutrition.  SEEK MEDICAL CARE IF:   You are getting worse rather than better.   Your symptoms are not controlled by medicine.   You have chills.  You have worsening shortness of breath.  You have brown or red mucus.  You have yellow or brown nasal discharge.  You have pain in your face, especially when you bend forward.  You have a fever.  You have swollen neck glands.  You  have pain while swallowing.  You have white areas in the back of your throat. SEEK IMMEDIATE MEDICAL CARE IF:   You have severe or persistent:  Headache.  Ear pain.  Sinus pain.  Chest pain.  You have chronic lung disease and any of the following:  Wheezing.  Prolonged cough.  Coughing up blood.  A change in your usual mucus.  You have a stiff neck.  You have changes in your:  Vision.  Hearing.  Thinking.  Mood. MAKE SURE YOU:   Understand these instructions.  Will watch your condition.  Will get help right away if you are not doing well or get worse.   This information is not intended to replace advice given to you by your health care provider. Make sure you discuss any questions you have with your health care provider.   Document Released: 08/11/2000 Document Revised: 07/02/2014 Document Reviewed: 05/23/2013 Elsevier Interactive Patient Education 2016 [employer].     IF you received an x-ray today, you will receive an invoice from [facility]. Please contact [facility] at [phone] with questions or concerns regarding your invoice.   IF you received labwork today, you will receive an invoice from [employer].  Please contact [facility] at [phone] with questions or concerns regarding your invoice.   Our billing staff will not be able to assist you with questions regarding bills from these companies.  You will be contacted with the lab results as soon as they are available. The fastest way to get your results is to activate your My Chart account. Instructions are located on the last page of this paperwork. If you have not heard from [place] regarding the results in 2 weeks, please contact this office.          No Follow-up on file.   HOPPER,DAVID, MD 06/01/2015

## 2015-06-03 ENCOUNTER — Telehealth: Payer: Self-pay | Admitting: *Deleted

## 2015-06-03 NOTE — Telephone Encounter (Signed)
Received call pt states she is having issues with her sinuses, ongoing cough. Been taking mucinex & delsm otc, but not seem to be helping. Wanting md to rx something...Cathy [name]

## 2015-06-03 NOTE — Telephone Encounter (Signed)
Unfortunately it is very difficult to treat without more information and exam  Please consider OV

## 2015-06-04 ENCOUNTER — Encounter: Payer: Self-pay | Admitting: Nurse Practitioner

## 2015-06-04 ENCOUNTER — Ambulatory Visit (INDEPENDENT_AMBULATORY_CARE_PROVIDER_SITE_OTHER): Payer: Medicare Other | Admitting: Nurse Practitioner

## 2015-06-04 VITALS — BP 134/70 | HR 65 | Temp 98.1°F | Resp 12 | Ht <= 58 in | Wt 123.8 lb

## 2015-06-04 DIAGNOSIS — J069 Acute upper respiratory infection, unspecified: Secondary | ICD-10-CM | POA: Diagnosis not present

## 2015-06-04 MED ORDER — CEPHALEXIN 500 MG PO CAPS: 500.0000 mg | ORAL_CAPSULE | Freq: Two times a day (BID) | ORAL | Status: DC

## 2015-06-04 NOTE — Progress Notes (Signed)
Patient ID: Cathy [name], female    DOB: 07-17-1927  Age: 82 y.o. MRN: [id number]  CC: Acute Visit   HPI Cathy [name] presents for CC of sinuitis x 4 days.   1) ST on Saturday night, Sunday morning [facility]  Nasal stuffiness  Denies HA or frontal pressure  Coughing up   Treatment to date:  Flonase  Tylenol  Mucinex  Delsym  Allegra   Sick contacts- grandson   History Cathy [name] has a past medical history of Hypertension; Venous insufficiency; Hypercholesteremia; Hiatal hernia; Dysphagia; Diverticulosis of colon; Irritable bowel syndrome; Colitis; Urinary incontinence; DJD (degenerative joint disease); Lumbar back pain; Osteoporosis; Anxiety; Anemia; Iron deficiency; Vitamin B12 deficiency; Allergic rhinitis; Splenic lesion (02/18/2015); Liver lesion (02/18/2015); Paroxysmal atrial flutter ([place]); Chronic systolic CHF (congestive heart failure) ([place]); Coronary artery calcification; and Sinus bradycardia.   She has past surgical history that includes cataract surgery; Abdominal hysterectomy; decompressive lumbar laminectomy (06/2008); Laparoscopic cholecystectomy (04/2009); Replacement total knee; TEE without cardioversion (N/A, 02/03/2015); Cardioversion (N/A, 02/03/2015); and Cardioversion (N/A, 03/10/2015).   Her family history includes Diabetes in her brother; Heart attack in her brother and mother. There is no history of Hypertension, Fainting, or Stroke.She reports that she has quit smoking. Her smoking use included Cigarettes. She has a 5 pack-year smoking history. She has never used smokeless tobacco. She reports that she does not drink alcohol or use illicit drugs.  Outpatient Prescriptions Prior to Visit  Medication Sig Dispense Refill  . apixaban (ELIQUIS) 2.5 MG TABS tablet Take 1 tablet (2.5 mg total) by mouth 2 (two) times daily. 180 tablet 2  . atorvastatin (LIPITOR) 20 MG tablet Take 1 tablet (20 mg total) by mouth daily at 6 PM. 90 tablet 2  . calcium-vitamin D (OSCAL  WITH D) 500-200 MG-UNIT per tablet Take 1 tablet by mouth daily.     . cholecalciferol (VITAMIN D) 1000 UNITS tablet Take 1,000 Units by mouth daily.    . diclofenac (FLECTOR) 1.3 % PTCH Place 1 patch onto the skin every 12 (twelve) hours. Apply as directed by Dr. Brien Few    . docusate sodium (COLACE) 100 MG capsule Take 1 capsule (100 mg total) by mouth every 12 (twelve) hours. 60 capsule 0  . esomeprazole (NEXIUM) 40 MG capsule Take 1 capsule (40 mg total) by mouth daily. 90 capsule 3  . Ferrous Sulfate Dried (FEOSOL) 200 (65 FE) MG TABS Take 1 tablet by mouth 2 (two) times daily. Take with a vitamin c 500mg      . fexofenadine (ALLEGRA) 180 MG tablet Take 180 mg by mouth daily as needed for allergies or rhinitis.    [name] oxyCODONE-acetaminophen (PERCOCET) 5-325 MG per tablet Take 1 tablet by mouth every 6 (six) hours as needed for moderate pain.     . polyethylene glycol (MIRALAX / GLYCOLAX) packet Take 17 g by mouth 2 (two) times daily. 28 each 0  . pregabalin (LYRICA) 50 MG capsule Take 1 capsule (50 mg total) by mouth 3 (three) times daily. 270 capsule 1  . PROLIA 60 MG/ML SOLN injection Inject 60 mg into the skin every 6 (six) months.     . solifenacin (VESICARE) 10 MG tablet Take 1 tablet (10 mg total) by mouth daily. 90 tablet 1  . vitamin B-12 (CYANOCOBALAMIN) 1000 MCG tablet Take 1 tablet (1,000 mcg total) by mouth daily.    . nitrofurantoin, macrocrystal-monohydrate, (MACROBID) 100 MG capsule Take 1 capsule (100 mg total) by mouth 2 (two) times daily. (Patient not  taking: Reported on 06/04/2015) 20 capsule 0   No facility-administered medications prior to visit.    ROS Review of Systems  Constitutional: Negative for fever, chills, diaphoresis and fatigue.  HENT: Positive for congestion, postnasal drip, rhinorrhea, sinus pressure and sore throat. Negative for sneezing and trouble swallowing.   Eyes: Negative for visual disturbance.  Respiratory: Positive for cough. Negative for chest  tightness, shortness of breath and wheezing.   Cardiovascular: Negative for chest pain, palpitations and leg swelling.  Gastrointestinal: Negative for nausea, vomiting and diarrhea.  Skin: Negative for rash.  Neurological: Negative for dizziness and headaches.    Objective:  BP 134/70 mmHg  Pulse 65  Temp(Src) 98.1 F (36.7 C) (Oral)  Resp 12  Ht 4\' 7"  (1.397 m)  Wt 123 lb 12.8 oz (56.155 kg)  BMI 28.77 kg/m2  SpO2 98%  Physical Exam  Constitutional: She is oriented to person, place, and time. She appears well-developed and well-nourished. No distress.  HENT:  Head: Normocephalic and atraumatic.  Right Ear: External ear normal.  Left Ear: External ear normal.  Mouth/Throat: Oropharynx is clear and moist. No oropharyngeal exudate.  TMs clear bilaterally  Eyes: EOM are normal. Pupils are equal, round, and reactive to light. Right eye exhibits no discharge. Left eye exhibits no discharge. No scleral icterus.  Neck: Normal range of motion. Neck supple.  Cardiovascular: Normal rate and regular rhythm.   Pulmonary/Chest: Effort normal and breath sounds normal. No respiratory distress. She has no wheezes. She has no rales. She exhibits no tenderness.  Lymphadenopathy:    She has no cervical adenopathy.  Neurological: She is alert and oriented to person, place, and time.  Skin: Skin is warm and dry. No rash noted. She is not diaphoretic.  Psychiatric: She has a normal mood and affect. Her behavior is normal. Judgment and thought content normal.   Assessment & Plan:   Cathy [name] was seen today for acute visit.  Diagnoses and all orders for this visit:  Acute URI  Other orders -     cephALEXin (KEFLEX) 500 MG capsule; Take 1 capsule (500 mg total) by mouth 2 (two) times daily.  I have discontinued [name] nitrofurantoin (macrocrystal-monohydrate). I am also having her start on cephALEXin. Additionally, I am having her maintain her Ferrous Sulfate Dried, diclofenac, calcium-vitamin  D, oxyCODONE-acetaminophen, PROLIA, solifenacin, vitamin B-12, esomeprazole, cholecalciferol, fexofenadine, polyethylene glycol, docusate sodium, apixaban, atorvastatin, and pregabalin.  Meds ordered this encounter  Medications  . cephALEXin (KEFLEX) 500 MG capsule    Sig: Take 1 capsule (500 mg total) by mouth 2 (two) times daily.    Dispense:  14 capsule    Refill:  0    Order Specific Question:  Supervising Provider    Answer:  Crecencio Mc [[id number]]     Follow-up: Return if symptoms worsen or fail to improve.

## 2015-06-04 NOTE — Assessment & Plan Note (Signed)
New Onset Due to length of symptoms with worsening will treat empirically  Keflex was sent to the pharmacy Encouraged Probiotics Continue OTC measures  FU prn worsening/failure to improve.

## 2015-06-04 NOTE — Telephone Encounter (Signed)
Notified pt w/MD response made appt w/carrie doss, NP @ 1:30...Cathy [name]

## 2015-06-04 NOTE — Patient Instructions (Signed)
Upper Respiratory Infection, Adult Most upper respiratory infections (URIs) are a viral infection of the air passages leading to the lungs. A URI affects the nose, throat, and upper air passages. The most common type of URI is nasopharyngitis and is typically referred to as "the common cold." URIs run their course and usually go away on their own. Most of the time, a URI does not require medical attention, but sometimes a bacterial infection in the upper airways can follow a viral infection. This is called a secondary infection. Sinus and middle ear infections are common types of secondary upper respiratory infections. Bacterial pneumonia can also complicate a URI. A URI can worsen asthma and chronic obstructive pulmonary disease (COPD). Sometimes, these complications can require emergency medical care and may be life threatening.  CAUSES Almost all URIs are caused by viruses. A virus is a type of germ and can spread from one person to another.  RISKS FACTORS You may be at risk for a URI if:   You smoke.   You have chronic heart or lung disease.  You have a weakened defense (immune) system.   You are very young or very old.   You have nasal allergies or asthma.  You work in crowded or poorly ventilated areas.  You work in health care facilities or schools. SIGNS AND SYMPTOMS  Symptoms typically develop 2-3 days after you come in contact with a cold virus. Most viral URIs last 7-10 days. However, viral URIs from the influenza virus (flu virus) can last 14-18 days and are typically more severe. Symptoms may include:   Runny or stuffy (congested) nose.   Sneezing.   Cough.   Sore throat.   Headache.   Fatigue.   Fever.   Loss of appetite.   Pain in your forehead, behind your eyes, and over your cheekbones (sinus pain).  Muscle aches.  DIAGNOSIS  Your health care provider may diagnose a URI by:  Physical exam.  Tests to check that your symptoms are not due to  another condition such as:  Strep throat.  Sinusitis.  Pneumonia.  Asthma. TREATMENT  A URI goes away on its own with time. It cannot be cured with medicines, but medicines may be prescribed or recommended to relieve symptoms. Medicines may help:  Reduce your fever.  Reduce your cough.  Relieve nasal congestion. HOME CARE INSTRUCTIONS   Take medicines only as directed by your health care provider.   Gargle warm saltwater or take cough drops to comfort your throat as directed by your health care provider.  Use a warm mist humidifier or inhale steam from a shower to increase air moisture. This may make it easier to breathe.  Drink enough fluid to keep your urine clear or pale yellow.   Eat soups and other clear broths and maintain good nutrition.   Rest as needed.   Return to work when your temperature has returned to normal or as your health care provider advises. You may need to stay home longer to avoid infecting others. You can also use a face mask and careful hand washing to prevent spread of the virus.  Increase the usage of your inhaler if you have asthma.   Do not use any tobacco products, including cigarettes, chewing tobacco, or electronic cigarettes. If you need help quitting, ask your health care provider. PREVENTION  The best way to protect yourself from getting a cold is to practice good hygiene.   Avoid oral or hand contact with people with cold   symptoms.   Wash your hands often if contact occurs.  There is no clear evidence that vitamin C, vitamin E, echinacea, or exercise reduces the chance of developing a cold. However, it is always recommended to get plenty of rest, exercise, and practice good nutrition.  SEEK MEDICAL CARE IF:   You are getting worse rather than better.   Your symptoms are not controlled by medicine.   You have chills.  You have worsening shortness of breath.  You have brown or red mucus.  You have yellow or brown nasal  discharge.  You have pain in your face, especially when you bend forward.  You have a fever.  You have swollen neck glands.  You have pain while swallowing.  You have white areas in the back of your throat. SEEK IMMEDIATE MEDICAL CARE IF:   You have severe or persistent:  Headache.  Ear pain.  Sinus pain.  Chest pain.  You have chronic lung disease and any of the following:  Wheezing.  Prolonged cough.  Coughing up blood.  A change in your usual mucus.  You have a stiff neck.  You have changes in your:  Vision.  Hearing.  Thinking.  Mood. MAKE SURE YOU:   Understand these instructions.  Will watch your condition.  Will get help right away if you are not doing well or get worse.   This information is not intended to replace advice given to you by your health care provider. Make sure you discuss any questions you have with your health care provider.   Document Released: 08/11/2000 Document Revised: 07/02/2014 Document Reviewed: 05/23/2013 Elsevier Interactive Patient Education 2016 Elsevier Inc.  

## 2015-06-04 NOTE — Progress Notes (Signed)
Pre visit review using our clinic review tool, if applicable. No additional management support is needed unless otherwise documented below in the visit note. 

## 2015-06-17 NOTE — Progress Notes (Signed)
Electrophysiology Office Note   Date:  06/18/2015   ID:  KHALYN LAB, DOB March 23, 1927, MRN [id number]  PCP:  [name], MD  Cardiologist:  Pernell Dupre Primary Electrophysiologist:  Jill Stopka Meredith Leeds, MD    Chief Complaint  Patient presents with  . Follow-up    3 months     History of Present Illness: Cathy [name] is a 82 y.o. female who presents today for electrophysiology evaluation.   She has a history of HTN, venous insufficiency, hypercholesterolemia, anemia, and recently diagnosed atrial flutter, combined CHF/LV dysfunction (EF 25-30%), and coronary artery calcification.  To recap recent history, she was admitted 01/2015 from urgent care after following up there for a UTI and being found to have atrial flutter 2:1 conduction. CTA 02/01/15 showed no PE but did show atherosclerosis of coronary arteries. TEE/DCCV was planned but DCCV was not performed due to inadequate visualization of the LA thrombus. She was started on amiodarone and Eliquis. 2D Echo 02/02/15: EF 25-30%, akinesis of mid-apical anteroseptal myocardium, grade 3 DD, severely dilated LA, mildly reduced RV systolic function, LV dysfunction was felt tachy-related. The patient was seen back in follow-up 02/25/15 and outpatient DCCV was arranged. Metoprolol was decreased due to HR 50's and amiodarone was decreased to once daily. She underwent DCCV 03/10/15. Of note she was also evaluated in the interim by oncology due to evaluation of liver/splenic lesions concerning for lymphoma. She had a PET scan performed recently that showed no hypermetabolic activity in the liver or the spleen and these lesions were questionable for vascular phenomena or multiple splenic hemangiomas versus lymphangiomas or cysts. It was recommended she f/u with PCP.   Today, she denies symptoms of palpitations, shortness of breath, orthopnea, PND, lower extremity edema, claudication, dizziness, presyncope, syncope, bleeding, or neurologic sequela. The  patient is tolerating medications without difficulties and is otherwise without complaint today.   She is complaint with her anticoagulation without bleeding issues.  She was taken off of her amiodarone and metoprolol in the past due to bradycardia. She has been having some chest discomfort when she exerts herself cleaning around the house. She says that it improves with rest. The discomfort lasts a few minutes. She is found no other alleviating factors.   Past Medical History  Diagnosis Date  . Hypertension   . Venous insufficiency   . Hypercholesteremia   . Hiatal hernia   . Dysphagia   . Diverticulosis of colon   . Irritable bowel syndrome   . Colitis     lymphocytic colitis feb 2011  . Urinary incontinence   . DJD (degenerative joint disease)   . Lumbar back pain   . Osteoporosis   . Anxiety   . Anemia   . Iron deficiency   . Vitamin B12 deficiency   . Allergic rhinitis   . Splenic lesion 02/18/2015  . Liver lesion 02/18/2015  . Paroxysmal atrial flutter ([place])     a. Dx 01/2015 - underwent DCCV 03/2015.  [name] Chronic systolic CHF (congestive heart failure) ([place])     a. 2D Echo 02/02/15: EF 25-30%, akinesis of mid-apical anteroseptal myocardium, grade 3 DD, mod MR, severely dilated LA, mildly reduced RV systoluc function, mod dilated RA, mod TR, mildly increased PASP 46mmHg.  [name] Coronary artery calcification     a. By CT 01/2015.  [name] Sinus bradycardia    Past Surgical History  Procedure Laterality Date  . Cataract surgery    . Abdominal hysterectomy    . Decompressive lumbar laminectomy  06/2008    at L2-3, L3-4 and L4-5 by Dr. Ronnald Ramp  . Laparoscopic cholecystectomy  04/2009    Dr. Abran Cantor  . Replacement total knee    . Tee without cardioversion N/A 02/03/2015    Procedure: TRANSESOPHAGEAL ECHOCARDIOGRAM (TEE);  Surgeon: Sueanne Margarita, MD;  Location: [facility] ENDOSCOPY;  Service: Cardiovascular;  Laterality: N/A;  . Cardioversion N/A 02/03/2015    Procedure: CARDIOVERSION;  Surgeon:  Sueanne Margarita, MD;  Location: [facility] ENDOSCOPY;  Service: Cardiovascular;  Laterality: N/A;  . Cardioversion N/A 03/10/2015    Procedure: CARDIOVERSION;  Surgeon: Dorothy Spark, MD;  Location: [facility] ENDOSCOPY;  Service: Cardiovascular;  Laterality: N/A;     Current Outpatient Prescriptions  Medication Sig Dispense Refill  . apixaban (ELIQUIS) 2.5 MG TABS tablet Take 1 tablet (2.5 mg total) by mouth 2 (two) times daily. 180 tablet 2  . atorvastatin (LIPITOR) 20 MG tablet Take 1 tablet (20 mg total) by mouth daily at 6 PM. 90 tablet 2  . calcium-vitamin D (OSCAL WITH D) 500-200 MG-UNIT per tablet Take 1 tablet by mouth daily.     . cholecalciferol (VITAMIN D) 1000 UNITS tablet Take 1,000 Units by mouth daily.    . diclofenac (FLECTOR) 1.3 % PTCH Place 1 patch onto the skin every 12 (twelve) hours. Apply as directed by Dr. Brien Few    . esomeprazole (NEXIUM) 40 MG capsule Take 1 capsule (40 mg total) by mouth daily. 90 capsule 3  . Ferrous Sulfate Dried (FEOSOL) 200 (65 FE) MG TABS Take 1 tablet by mouth 2 (two) times daily. Take with a vitamin c 500mg      . fexofenadine (ALLEGRA) 180 MG tablet Take 180 mg by mouth daily as needed for allergies or rhinitis.    [name] oxyCODONE-acetaminophen (PERCOCET) 5-325 MG per tablet Take 1 tablet by mouth every 6 (six) hours as needed for moderate pain.     . polyethylene glycol (MIRALAX / GLYCOLAX) packet Take 17 g by mouth 2 (two) times daily. 28 each 0  . pregabalin (LYRICA) 50 MG capsule Take 1 capsule (50 mg total) by mouth 3 (three) times daily. 270 capsule 1  . PROLIA 60 MG/ML SOLN injection Inject 60 mg into the skin every 6 (six) months.     . solifenacin (VESICARE) 10 MG tablet Take 1 tablet (10 mg total) by mouth daily. 90 tablet 1  . vitamin B-12 (CYANOCOBALAMIN) 1000 MCG tablet Take 1 tablet (1,000 mcg total) by mouth daily.    . isosorbide mononitrate (IMDUR) 30 MG 24 hr tablet Take 1 tablet (30 mg total) by mouth daily. 30 tablet 6   No current  facility-administered medications for this visit.    Allergies:   Amoxicillin-pot clavulanate and Sulfonamide derivatives   Social History:  The patient  reports that she has quit smoking. Her smoking use included Cigarettes. She has a 5 pack-year smoking history. She has never used smokeless tobacco. She reports that she does not drink alcohol or use illicit drugs.   Family History:  The patient's family history includes Diabetes in her brother; Heart attack in her brother and mother. There is no history of Hypertension, Fainting, or Stroke.    ROS:  Please see the history of present illness.   All other systems are reviewed and low dose.    PHYSICAL EXAM: VS:  BP 148/82 mmHg  Pulse 52  Ht 4\' 8"  (1.422 m)  Wt 126 lb 12.8 oz (57.516 kg)  BMI 28.44 kg/m2 , BMI Body mass index  is 28.44 kg/(m^2). GEN: Well nourished, well developed, in no acute distress HEENT: normal Neck: no JVD, carotid bruits, or masses Cardiac: bradycardic; no murmurs, rubs, or gallops,no edema  Respiratory:  clear to auscultation bilaterally, normal work of breathing GI: soft, nontender, nondistended, + BS MS: no deformity or atrophy Skin: warm and dry Neuro:  Strength and sensation are intact Psych: euthymic mood, full affect  EKG:  EKG is ordered today. The ekg ordered today shows sinus rhythm, nonspecific interventricular conduction delay, QRS 124   Recent Labs: 01/31/2015: TSH 1.303 02/01/2015: B Natriuretic Peptide 421.8* 02/05/2015: Magnesium 1.9 03/17/2015: ALT 63*; Hemoglobin 11.0*; Platelets 164 03/19/2015: BUN 16; Creat 0.98*; Potassium 4.9; Sodium 140    Lipid Panel     Component Value Date/Time   CHOL 153 12/10/2013 1222   TRIG 53.0 12/10/2013 1222   HDL 67.00 12/10/2013 1222   CHOLHDL 2 12/10/2013 1222   VLDL 10.6 12/10/2013 1222   LDLCALC 75 12/10/2013 1222   LDLDIRECT 158.1 05/29/2008 1137     Wt Readings from Last 3 Encounters:  06/18/15 126 lb 12.8 oz (57.516 kg)  06/04/15 123 lb  12.8 oz (56.155 kg)  06/01/15 125 lb (56.7 kg)      Other studies Reviewed: Additional studies/ records that were reviewed today include: TTE 02/02/15 Review of the above records today demonstrates:  - Left ventricle: The cavity size was normal. There was moderate concentric hypertrophy. Systolic function was severely reduced. The estimated ejection fraction was in the range of 25% to 30%. There is akinesis of the mid-apicalanteroseptal myocardium. Doppler parameters are consistent with a reversible restrictive pattern, indicative of decreased left ventricular diastolic compliance and/or increased left atrial pressure (grade 3 diastolic dysfunction). - Mitral valve: Moderately calcified annulus. There was moderate regurgitation. - Left atrium: The atrium was severely dilated. Volume/bsa, ES (1-plane Simpson&'s, A4C): 64.3 ml/m^2. - Right ventricle: Systolic function was mildly reduced. - Right atrium: The atrium was moderately dilated. - Tricuspid valve: There was moderate regurgitation. - Pulmonary arteries: Systolic pressure was mildly increased. PA peak pressure: 33 mm Hg (S).   ASSESSMENT AND PLAN:  1. Atrial flutter s/p DCCV, now with sinus bradycardia - Currently not on any rate slowing medications or antiarrhythmics due to bradycardia. She is anticoagulated with apixaban. We'll continue current management.  This patients CHA2DS2-VASc Score and unadjusted Ischemic Stroke Rate (% per year) is equal to 4.8 % stroke rate/year from a score of 4  Above score calculated as 1 point each if present [CHF, HTN, DM, Vascular=MI/PAD/Aortic Plaque, Age if 65-74, or Female] Above score calculated as 2 points each if present [Age > 75, or Stroke/TIA/TE]    2. Chronic combined CHF - doing well with no issues of shortness of breath. She has been having some mild chest discomfort when she exerts herself. We'll start her on Imdur today. 3. Essential HTN - mildly elevated  today, we'll continue to follow. Adding Imdur.    Current medicines are reviewed at length with the patient today.   The patient does not have concerns regarding her medicines.  The following changes were made today:  imdur  Labs/ tests ordered today include:  Orders Placed This Encounter  Procedures  . EKG 12-Lead     Disposition:   FU with Ranata Laughery 6  months  Signed, Mella Inclan Meredith Leeds, MD  06/18/2015 12:17 PM     [address] [place] [address] [phone] (office) [phone] (fax)

## 2015-06-18 ENCOUNTER — Encounter: Payer: Self-pay | Admitting: Cardiology

## 2015-06-18 ENCOUNTER — Ambulatory Visit (INDEPENDENT_AMBULATORY_CARE_PROVIDER_SITE_OTHER): Payer: Medicare Other | Admitting: Cardiology

## 2015-06-18 VITALS — BP 148/82 | HR 52 | Ht <= 58 in | Wt 126.8 lb

## 2015-06-18 DIAGNOSIS — I483 Typical atrial flutter: Secondary | ICD-10-CM | POA: Diagnosis not present

## 2015-06-18 MED ORDER — ISOSORBIDE MONONITRATE ER 30 MG PO TB24: 30.0000 mg | ORAL_TABLET | Freq: Every day | ORAL | Status: DC

## 2015-06-18 NOTE — Patient Instructions (Addendum)
Medication Instructions:  Your physician has recommended you make the following change in your medication:  1) START Imdur 30 mg daily  Labwork: None ordered  Testing/Procedures: None ordered  Follow-Up: Your physician wants you to follow-up in: 6 months with Dr. Curt Bears. You will receive a reminder letter in the mail two months in advance. If you don't receive a letter, please call our office to schedule the follow-up appointment.  If you need a refill on your cardiac medications before your next appointment, please call your pharmacy.  Thank you for choosing [facility]!!   Cathy Curet, RN [phone]   Isosorbide Mononitrate extended-release tablets What is this medicine? ISOSORBIDE MONONITRATE (eye soe SOR bide mon oh NYE trate) is a vasodilator. It relaxes blood vessels, increasing the blood and oxygen supply to your heart. This medicine is used to prevent chest pain caused by angina. It will not help to stop an episode of chest pain. This medicine may be used for other purposes; ask your health care provider or pharmacist if you have questions. What should I tell my health care provider before I take this medicine? They need to know if you have any of these conditions: -previous heart attack or heart failure -an unusual or allergic reaction to isosorbide mononitrate, nitrates, other medicines, foods, dyes, or preservatives -pregnant or trying to get pregnant -breast-feeding How should I use this medicine? Take this medicine by mouth with a glass of water. Follow the directions on the prescription label. Do not crush or chew. Take your medicine at regular intervals. Do not take your medicine more often than directed. Do not stop taking this medicine except on the advice of your doctor or health care professional. Talk to your pediatrician regarding the use of this medicine in children. Special care may be needed. Overdosage: If you think you have taken too much of this  medicine contact a poison control center or emergency room at once. NOTE: This medicine is only for you. Do not share this medicine with others. What if I miss a dose? If you miss a dose, take it as soon as you can. If it is almost time for your next dose, take only that dose. Do not take double or extra doses. What may interact with this medicine? Do not take this medicine with any of the following medications: -medicines used to treat erectile dysfunction (ED) like avanafil, sildenafil, tadalafil, and vardenafil -riociguat This medicine may also interact with the following medications: -medicines for high blood pressure -other medicines for angina or heart failure This list may not describe all possible interactions. Give your health care provider a list of all the medicines, herbs, non-prescription drugs, or dietary supplements you use. Also tell them if you smoke, drink alcohol, or use illegal drugs. Some items may interact with your medicine. What should I watch for while using this medicine? Check your heart rate and blood pressure regularly while you are taking this medicine. Ask your doctor or health care professional what your heart rate and blood pressure should be and when you should contact him or her. Tell your doctor or health care professional if you feel your medicine is no longer working. You may get dizzy. Do not drive, use machinery, or do anything that needs mental alertness until you know how this medicine affects you. To reduce the risk of dizzy or fainting spells, do not sit or stand up quickly, especially if you are an older patient. Alcohol can make you more dizzy, and  increase flushing and rapid heartbeats. Avoid alcoholic drinks. Do not treat yourself for coughs, colds, or pain while you are taking this medicine without asking your doctor or health care professional for advice. Some ingredients may increase your blood pressure. What side effects may I notice from receiving  this medicine? Side effects that you should report to your doctor or health care professional as soon as possible: -bluish discoloration of lips, fingernails, or palms of hands -irregular heartbeat, palpitations -low blood pressure -nausea, vomiting -persistent headache -unusually weak or tired Side effects that usually do not require medical attention (report to your doctor or health care professional if they continue or are bothersome): -flushing of the face or neck -rash This list may not describe all possible side effects. Call your doctor for medical advice about side effects. You may report side effects to FDA at 1-800-FDA-1088. Where should I keep my medicine? Keep out of the reach of children. Store between 15 and 30 degrees C (59 and 86 degrees F). Keep container tightly closed. Throw away any unused medicine after the expiration date. NOTE: This sheet is a summary. It may not cover all possible information. If you have questions about this medicine, talk to your doctor, pharmacist, or health care provider.    2016, Elsevier/Gold Standard. (2012-12-15 14:48:19)

## 2015-06-29 ENCOUNTER — Other Ambulatory Visit: Payer: Self-pay | Admitting: Internal Medicine

## 2015-07-01 ENCOUNTER — Telehealth: Payer: Self-pay | Admitting: *Deleted

## 2015-07-01 NOTE — Telephone Encounter (Signed)
Received call daughter states they received letter for renewal on mom Lyrica...Johny Chess

## 2015-07-01 NOTE — Telephone Encounter (Signed)
lyrica should not be needed since last rx for 6 mo was done feb 2017

## 2015-07-02 NOTE — Telephone Encounter (Signed)
Notified pt daughter w/MD response. She stated that [facility] sent them a letter stating that they needed to get new rx for Lyrica. Inform daughter will contact [facility] to see what the issue...Johny Chess

## 2015-07-02 NOTE — Telephone Encounter (Signed)
Called [facility] spoke with rep Caren Griffins verified rx for Lyrica. She stated that they did received script on 2/22, and pt has 1 additional refill. Not sure why letter was sent but to have them to dis-regard. Called daughter back gave her status on rx...Cathy [name]

## 2015-07-10 ENCOUNTER — Other Ambulatory Visit: Payer: Self-pay

## 2015-07-10 ENCOUNTER — Telehealth: Payer: Self-pay

## 2015-07-10 DIAGNOSIS — I5042 Chronic combined systolic (congestive) and diastolic (congestive) heart failure: Secondary | ICD-10-CM

## 2015-07-10 NOTE — Telephone Encounter (Signed)
-----   Message from Belva Crome, MD sent at 06/19/2015 12:11 PM EDT ----- Regarding: Echo to f/u EF She needs to have echo to see if LV function has improved. ----- Message -----    From: Constance Haw, MD    Sent: 06/18/2015  12:19 PM      To: Belva Crome, MD

## 2015-07-10 NOTE — Telephone Encounter (Signed)
Called to give pt Dr.Smith Dr.Smith recommendation. lmtcb She needs to have echo to see if LV function has improved. Order for echo in [place].

## 2015-07-15 ENCOUNTER — Ambulatory Visit (INDEPENDENT_AMBULATORY_CARE_PROVIDER_SITE_OTHER): Payer: Medicare Other | Admitting: Internal Medicine

## 2015-07-15 VITALS — BP 104/62 | HR 65 | Temp 98.6°F | Resp 16 | Ht <= 58 in | Wt 125.8 lb

## 2015-07-15 DIAGNOSIS — R6883 Chills (without fever): Secondary | ICD-10-CM | POA: Diagnosis not present

## 2015-07-15 DIAGNOSIS — N390 Urinary tract infection, site not specified: Secondary | ICD-10-CM | POA: Diagnosis not present

## 2015-07-15 DIAGNOSIS — R110 Nausea: Secondary | ICD-10-CM

## 2015-07-15 LAB — POCT URINALYSIS DIP (MANUAL ENTRY)
BILIRUBIN UA: NEGATIVE
GLUCOSE UA: NEGATIVE
Ketones, POC UA: NEGATIVE
NITRITE UA: POSITIVE — AB
PH UA: 5.5
Protein Ur, POC: >=300 — AB
Spec Grav, UA: 1.015
Urobilinogen, UA: 0.2

## 2015-07-15 LAB — POC MICROSCOPIC URINALYSIS (UMFC): Mucus: ABSENT

## 2015-07-15 MED ORDER — CEFTRIAXONE SODIUM 1 G IJ SOLR
1.0000 g | Freq: Once | INTRAMUSCULAR | Status: AC
  Administered 2015-07-15: 1 g via INTRAMUSCULAR

## 2015-07-15 MED ORDER — ONDANSETRON HCL 4 MG PO TABS: 4.0000 mg | ORAL_TABLET | Freq: Three times a day (TID) | ORAL | Status: DC | PRN

## 2015-07-15 MED ORDER — CIPROFLOXACIN HCL 500 MG PO TABS: 500.0000 mg | ORAL_TABLET | Freq: Two times a day (BID) | ORAL | Status: DC

## 2015-07-15 NOTE — Progress Notes (Signed)
By signing my name below, I, Mesha Guinyard, attest that this documentation has been prepared under the direction and in the presence of Tami Lin, MD.  Electronically Signed: Verlee Monte, Medical Scribe. 07/15/2015. 6:18 PM.  Subjective:    Patient ID: Cathy [name], female    DOB: 12-Sep-1927, 82 y.o.   MRN: [id number]  HPI Chief Complaint  Patient presents with  . Generalized Body Aches    Started last night  . Nausea    When eating    HPI Comments: Cathy [name] is a 82 y.o. female who presents to the [facility] complaining of body aches and nausea onset last night. Pt reports associated symptoms of fever, and weakness. Pt denies coughing, sore throat, and chills.Pt is struggling a little with her words. She is brought in by her family who has had to care for her more today than usual. Her intake is poor. There is no vomiting..  Patient Active Problem List   Diagnosis Date Noted  . Chronic combined systolic and diastolic CHF (congestive heart failure) ([place]) 03/17/2015  . Abnormal CT of the abdomen--IMPRESSION:01/31/15 1. Increased number and size of the numerous splenic masses indicating neoplastic process, possibly lymphoma or metastasis as suggested on the previous exam. 2. Stable, or slightly smaller, left adrenal mass. This also remains suspicious for neoplastic process. 3. New enhancing lesion within the left hepatic lobe, segment 4B, measuring 2.8 x 1.5 cm. This is also suspicious for metastatic disease. 01/31/2015  . Peripheral neuropathy ([place]) 12/10/2013  . Essential hypertension 02/13/2007   Allergies  Allergen Reactions  . Amoxicillin-Pot Clavulanate     REACTION: diarrhea  . Sulfonamide Derivatives Other (See Comments)    unknown   Prior to Admission medications   Medication Sig Start Date End Date Taking? Authorizing Provider  apixaban (ELIQUIS) 2.5 MG TABS tablet Take 1 tablet (2.5 mg total) by mouth 2 (two) times daily.  03/19/15  Yes Will Meredith Leeds, MD  atorvastatin (LIPITOR) 20 MG tablet Take 1 tablet (20 mg total) by mouth daily at 6 PM. 04/18/15  Yes Biagio Borg, MD  calcium-vitamin D (OSCAL WITH D) 500-200 MG-UNIT per tablet Take 1 tablet by mouth daily.    Yes Historical Provider, MD  cholecalciferol (VITAMIN D) 1000 UNITS tablet Take 1,000 Units by mouth daily.   Yes Historical Provider, MD  diclofenac (FLECTOR) 1.3 % PTCH Place 1 patch onto the skin every 12 (twelve) hours. Apply as directed by Dr. Netta Neat Historical Provider, MD  esomeprazole (NEXIUM) 40 MG capsule TAKE 1 CAPSULE DAILY 06/30/15  Yes Biagio Borg, MD  Ferrous Sulfate Dried (FEOSOL) 200 (65 FE) MG TABS Take 1 tablet by mouth 2 (two) times daily. Take with a vitamin c 500mg     Yes Historical Provider, MD  fexofenadine (ALLEGRA) 180 MG tablet Take 180 mg by mouth daily as needed for allergies or rhinitis.   Yes Historical Provider, MD  isosorbide mononitrate (IMDUR) 30 MG 24 hr tablet Take 1 tablet (30 mg total) by mouth daily. 06/18/15  Yes Will Meredith Leeds, MD  oxyCODONE-acetaminophen (PERCOCET) 5-325 MG per tablet Take 1 tablet by mouth every 6 (six) hours as needed for moderate pain.    Yes Historical Provider, MD  polyethylene glycol (MIRALAX / GLYCOLAX) packet Take 17 g by mouth 2 (two) times daily. 10/22/14  Yes April Palumbo, MD  pregabalin (LYRICA) 50 MG capsule Take 1 capsule (50 mg total) by mouth 3 (three) times daily.  04/23/15  Yes Biagio Borg, MD  PROLIA 60 MG/ML SOLN injection Inject 60 mg into the skin every 6 (six) months.  03/19/14  Yes Historical Provider, MD  solifenacin (VESICARE) 10 MG tablet Take 1 tablet (10 mg total) by mouth daily. 06/17/14  Yes Rowe Clack, MD  vitamin B-12 (CYANOCOBALAMIN) 1000 MCG tablet Take 1 tablet (1,000 mcg total) by mouth daily. 06/17/14  Yes Rowe Clack, MD    Review of Systems  Constitutional: Positive for fever. Negative for chills.  HENT: Negative for sore throat.     Respiratory: Negative for cough.   Gastrointestinal: Positive for nausea. Negative for vomiting and diarrhea.  Genitourinary: Positive for vaginal pain. Negative for dysuria, frequency and difficulty urinating.  Musculoskeletal: Positive for myalgias.  Neurological: Positive for weakness.   Objective:  BP 104/62 mmHg  Pulse 65  Temp(Src) 98.6 F (37 C) (Oral)  Resp 16  Ht 4\' 9"  (1.448 m)  Wt 125 lb 12.8 oz (57.063 kg)  BMI 27.22 kg/m2  SpO2 94%  Physical Exam  Constitutional: She is oriented to person, place, and time.  Appears fatigued.  Eyes: Conjunctivae and EOM are normal. Pupils are equal, round, and reactive to light.  Cardiovascular: Normal rate, regular rhythm and intact distal pulses.   Murmur heard. Pulmonary/Chest: Effort normal and breath sounds normal.  Abdominal:  Mildly tender suprapubic. Tender to percussion right CVA.  Musculoskeletal: She exhibits no edema.  Lymphadenopathy:    She has no cervical adenopathy.  Neurological: She is oriented to person, place, and time. No cranial nerve deficit.   Results for orders placed or performed in visit on 07/15/15  POCT Microscopic Urinalysis ([facility])  Result Value Ref Range   WBC,UR,HPF,POC Too numerous to count  (A) None WBC/hpf   RBC,UR,HPF,POC Moderate (A) None RBC/hpf   Bacteria Few (A) None, Too numerous to count   Mucus Absent Absent   Epithelial Cells, UR Per Microscopy Few (A) None, Too numerous to count cells/hpf  POCT urinalysis dipstick  Result Value Ref Range   Color, UA yellow yellow   Clarity, UA cloudy (A) clear   Glucose, UA negative negative   Bilirubin, UA negative negative   Ketones, POC UA negative negative   Spec Grav, UA 1.015    Blood, UA moderate (A) negative   pH, UA 5.5    Protein Ur, POC >=300 (A) negative   Urobilinogen, UA 0.2    Nitrite, UA Positive (A) Negative   Leukocytes, UA moderate (2+) (A) Negative   Assessment & Plan:  Nausea without vomiting -chills  Urinary tract  infection, - Plan: cefTRIAXone (ROCEPHIN) injection 1 g, Urine culture Meds ordered this encounter  Medications  . cefTRIAXone (ROCEPHIN) injection 1 g    Sig:     Order Specific Question:  Antibiotic Indication:    Answer:  UTI  . ciprofloxacin (CIPRO) 500 MG tablet    Sig: Take 1 tablet (500 mg total) by mouth 2 (two) times daily.    Dispense:  20 tablet    Refill:  0  . ondansetron (ZOFRAN) 4 MG tablet    Sig: Take 1 tablet (4 mg total) by mouth every 8 (eight) hours as needed for nausea or vomiting.    Dispense:  12 tablet    Refill:  0  push po fluids  I have completed the patient encounter in its entirety as documented by the scribe, with editing by me where necessary. Tyquarius Paglia P. Laney Pastor, M.D.

## 2015-07-18 LAB — URINE CULTURE

## 2015-07-29 ENCOUNTER — Other Ambulatory Visit (HOSPITAL_COMMUNITY): Payer: Self-pay | Admitting: Rheumatology

## 2015-07-29 DIAGNOSIS — M25532 Pain in left wrist: Secondary | ICD-10-CM

## 2015-07-31 ENCOUNTER — Other Ambulatory Visit (HOSPITAL_COMMUNITY): Payer: Medicare Other

## 2015-08-01 ENCOUNTER — Ambulatory Visit (HOSPITAL_COMMUNITY): Payer: Medicare Other | Attending: Cardiology

## 2015-08-01 ENCOUNTER — Other Ambulatory Visit: Payer: Self-pay

## 2015-08-01 DIAGNOSIS — I340 Nonrheumatic mitral (valve) insufficiency: Secondary | ICD-10-CM | POA: Diagnosis not present

## 2015-08-01 DIAGNOSIS — I5042 Chronic combined systolic (congestive) and diastolic (congestive) heart failure: Secondary | ICD-10-CM | POA: Insufficient documentation

## 2015-08-01 DIAGNOSIS — E7800 Pure hypercholesterolemia, unspecified: Secondary | ICD-10-CM | POA: Insufficient documentation

## 2015-08-01 DIAGNOSIS — I517 Cardiomegaly: Secondary | ICD-10-CM | POA: Insufficient documentation

## 2015-08-01 DIAGNOSIS — I071 Rheumatic tricuspid insufficiency: Secondary | ICD-10-CM | POA: Diagnosis not present

## 2015-08-05 ENCOUNTER — Ambulatory Visit (HOSPITAL_COMMUNITY)
Admission: RE | Admit: 2015-08-05 | Discharge: 2015-08-05 | Disposition: A | Discharge status: Home / Self Care | Payer: Medicare Other | Source: Ambulatory Visit | Attending: Rheumatology | Admitting: Rheumatology

## 2015-08-05 DIAGNOSIS — X58XXXA Exposure to other specified factors, initial encounter: Secondary | ICD-10-CM | POA: Insufficient documentation

## 2015-08-05 DIAGNOSIS — G5600 Carpal tunnel syndrome, unspecified upper limb: Secondary | ICD-10-CM | POA: Diagnosis not present

## 2015-08-05 DIAGNOSIS — M25432 Effusion, left wrist: Secondary | ICD-10-CM | POA: Insufficient documentation

## 2015-08-05 DIAGNOSIS — M25532 Pain in left wrist: Secondary | ICD-10-CM | POA: Diagnosis present

## 2015-08-05 DIAGNOSIS — M659 Synovitis and tenosynovitis, unspecified: Secondary | ICD-10-CM | POA: Insufficient documentation

## 2015-08-05 DIAGNOSIS — M21832 Other specified acquired deformities of left forearm: Secondary | ICD-10-CM | POA: Diagnosis not present

## 2015-08-05 DIAGNOSIS — S63512A Sprain of carpal joint of left wrist, initial encounter: Secondary | ICD-10-CM | POA: Insufficient documentation

## 2015-09-08 ENCOUNTER — Ambulatory Visit (HOSPITAL_BASED_OUTPATIENT_CLINIC_OR_DEPARTMENT_OTHER): Payer: Medicare Other | Admitting: Internal Medicine

## 2015-09-08 ENCOUNTER — Other Ambulatory Visit (HOSPITAL_BASED_OUTPATIENT_CLINIC_OR_DEPARTMENT_OTHER): Payer: Medicare Other

## 2015-09-08 ENCOUNTER — Telehealth: Payer: Self-pay | Admitting: Medical Oncology

## 2015-09-08 ENCOUNTER — Encounter: Payer: Self-pay | Admitting: Internal Medicine

## 2015-09-08 VITALS — BP 126/75 | HR 86 | Temp 98.6°F | Resp 18 | Ht <= 58 in | Wt 124.9 lb

## 2015-09-08 DIAGNOSIS — R935 Abnormal findings on diagnostic imaging of other abdominal regions, including retroperitoneum: Secondary | ICD-10-CM

## 2015-09-08 DIAGNOSIS — D739 Disease of spleen, unspecified: Secondary | ICD-10-CM

## 2015-09-08 DIAGNOSIS — D720 Genetic anomalies of leukocytes: Secondary | ICD-10-CM | POA: Diagnosis not present

## 2015-09-08 DIAGNOSIS — K7689 Other specified diseases of liver: Secondary | ICD-10-CM

## 2015-09-08 DIAGNOSIS — K769 Liver disease, unspecified: Secondary | ICD-10-CM

## 2015-09-08 LAB — CBC WITH DIFFERENTIAL/PLATELET
BASO%: 0.4 % (ref 0.0–2.0)
Basophils Absolute: 0 10*3/uL (ref 0.0–0.1)
EOS%: 0.5 % (ref 0.0–7.0)
Eosinophils Absolute: 0 10*3/uL (ref 0.0–0.5)
HCT: 38.2 % (ref 34.8–46.6)
HGB: 12.6 g/dL (ref 11.6–15.9)
LYMPH%: 6 % — AB (ref 14.0–49.7)
MCH: 31.2 pg (ref 25.1–34.0)
MCHC: 32.9 g/dL (ref 31.5–36.0)
MCV: 94.9 fL (ref 79.5–101.0)
MONO#: 0.2 10*3/uL (ref 0.1–0.9)
MONO%: 1.7 % (ref 0.0–14.0)
NEUT%: 91.4 % — ABNORMAL HIGH (ref 38.4–76.8)
NEUTROS ABS: 8.6 10*3/uL — AB (ref 1.5–6.5)
PLATELETS: 167 10*3/uL (ref 145–400)
RBC: 4.03 10*6/uL (ref 3.70–5.45)
RDW: 14 % (ref 11.2–14.5)
WBC: 9.3 10*3/uL (ref 3.9–10.3)
lymph#: 0.6 10*3/uL — ABNORMAL LOW (ref 0.9–3.3)

## 2015-09-08 LAB — COMPREHENSIVE METABOLIC PANEL
ALT: 23 U/L (ref 0–55)
ANION GAP: 10 meq/L (ref 3–11)
AST: 36 U/L — AB (ref 5–34)
Albumin: 3.7 g/dL (ref 3.5–5.0)
Alkaline Phosphatase: 50 U/L (ref 40–150)
BILIRUBIN TOTAL: 0.32 mg/dL (ref 0.20–1.20)
BUN: 15.9 mg/dL (ref 7.0–26.0)
CO2: 26 meq/L (ref 22–29)
CREATININE: 1 mg/dL (ref 0.6–1.1)
Calcium: 9.3 mg/dL (ref 8.4–10.4)
Chloride: 107 mEq/L (ref 98–109)
EGFR: 53 mL/min/{1.73_m2} — ABNORMAL LOW (ref >90)
GLUCOSE: 159 mg/dL — AB (ref 70–140)
Potassium: 3.9 mEq/L (ref 3.5–5.1)
SODIUM: 143 meq/L (ref 136–145)
TOTAL PROTEIN: 6.7 g/dL (ref 6.4–8.3)

## 2015-09-08 LAB — LACTATE DEHYDROGENASE: LDH: 269 U/L — ABNORMAL HIGH (ref 125–245)

## 2015-09-08 NOTE — Progress Notes (Signed)
[name]   [phone]  OFFICE PROGRESS NOTE  [name], MD [place] [address]  DIAGNOSIS: Liver and splenic lesions questionable for vascular phenomena in the liver and multiple splenic hemangiomas, lymphangiomas or cyst.  PRIOR THERAPY: None  CURRENT THERAPY: None  INTERVAL HISTORY: Cathy [name] 82 y.o. female returns to the clinic today for follow-up visit accompanied by her daughter. The patient is feeling fine today except for generalized fatigue and arthritis of her hands. She denied having any significant weight loss or night sweats. She has no nausea or vomiting. She denied having any significant chest pain but continues to have shortness breath with exertion with no cough or hemoptysis. She had repeat CBC, compressed metabolic panel and LDH performed recently and she is here for evaluation and discussion of her lab results.  MEDICAL HISTORY: Past Medical History  Diagnosis Date  . Hypertension   . Venous insufficiency   . Hypercholesteremia   . Hiatal hernia   . Dysphagia   . Diverticulosis of colon   . Irritable bowel syndrome   . Colitis     lymphocytic colitis feb 2011  . Urinary incontinence   . DJD (degenerative joint disease)   . Lumbar back pain   . Osteoporosis   . Anxiety   . Anemia   . Iron deficiency   . Vitamin B12 deficiency   . Allergic rhinitis   . Splenic lesion 02/18/2015  . Liver lesion 02/18/2015  . Paroxysmal atrial flutter ([place])     a. Dx 01/2015 - underwent DCCV 03/2015.  [name] Chronic systolic CHF (congestive heart failure) ([place])     a. 2D Echo 02/02/15: EF 25-30%, akinesis of mid-apical anteroseptal myocardium, grade 3 DD, mod MR, severely dilated LA, mildly reduced RV systoluc function, mod dilated RA, mod TR, mildly increased PASP 24mmHg.  [name] Coronary artery calcification     a. By CT 01/2015.  [name] Sinus bradycardia     ALLERGIES:  is allergic to amoxicillin-pot  clavulanate and sulfonamide derivatives.  MEDICATIONS:  Current Outpatient Prescriptions  Medication Sig Dispense Refill  . apixaban (ELIQUIS) 2.5 MG TABS tablet Take 1 tablet (2.5 mg total) by mouth 2 (two) times daily. 180 tablet 2  . atorvastatin (LIPITOR) 20 MG tablet Take 1 tablet (20 mg total) by mouth daily at 6 PM. 90 tablet 2  . calcium-vitamin D (OSCAL WITH D) 500-200 MG-UNIT per tablet Take 1 tablet by mouth daily.     . cholecalciferol (VITAMIN D) 1000 UNITS tablet Take 1,000 Units by mouth daily.    . diclofenac (FLECTOR) 1.3 % PTCH Place 1 patch onto the skin every 12 (twelve) hours. Apply as directed by Dr. Brien Few    . esomeprazole (NEXIUM) 40 MG capsule TAKE 1 CAPSULE DAILY 90 capsule 2  . Ferrous Sulfate Dried (FEOSOL) 200 (65 FE) MG TABS Take 1 tablet by mouth 2 (two) times daily. Take with a vitamin c 500mg      . fexofenadine (ALLEGRA) 180 MG tablet Take 180 mg by mouth daily as needed for allergies or rhinitis.    [name] isosorbide mononitrate (IMDUR) 30 MG 24 hr tablet Take 1 tablet (30 mg total) by mouth daily. 30 tablet 6  . oxyCODONE-acetaminophen (PERCOCET) 5-325 MG per tablet Take 1 tablet by mouth every 6 (six) hours as needed for moderate pain.     . polyethylene glycol (MIRALAX / GLYCOLAX) packet Take 17 g by mouth  2 (two) times daily. 28 each 0  . pregabalin (LYRICA) 50 MG capsule Take 1 capsule (50 mg total) by mouth 3 (three) times daily. 270 capsule 1  . PROLIA 60 MG/ML SOLN injection Inject 60 mg into the skin every 6 (six) months.     . solifenacin (VESICARE) 10 MG tablet Take 1 tablet (10 mg total) by mouth daily. 90 tablet 1  . vitamin B-12 (CYANOCOBALAMIN) 1000 MCG tablet Take 1 tablet (1,000 mcg total) by mouth daily.    . ondansetron (ZOFRAN) 4 MG tablet Take 1 tablet (4 mg total) by mouth every 8 (eight) hours as needed for nausea or vomiting. (Patient not taking: Reported on 09/08/2015) 12 tablet 0   No current facility-administered medications for this  visit.    SURGICAL HISTORY:  Past Surgical History  Procedure Laterality Date  . Cataract surgery    . Abdominal hysterectomy    . Decompressive lumbar laminectomy  06/2008    at L2-3, L3-4 and L4-5 by Dr. Ronnald Ramp  . Laparoscopic cholecystectomy  04/2009    Dr. Abran Cantor  . Replacement total knee    . Tee without cardioversion N/A 02/03/2015    Procedure: TRANSESOPHAGEAL ECHOCARDIOGRAM (TEE);  Surgeon: Sueanne Margarita, MD;  Location: [facility];  Service: Cardiovascular;  Laterality: N/A;  . Cardioversion N/A 02/03/2015    Procedure: CARDIOVERSION;  Surgeon: Sueanne Margarita, MD;  Location: [facility];  Service: Cardiovascular;  Laterality: N/A;  . Cardioversion N/A 03/10/2015    Procedure: CARDIOVERSION;  Surgeon: Dorothy Spark, MD;  Location: [facility];  Service: Cardiovascular;  Laterality: N/A;    REVIEW OF SYSTEMS:  A comprehensive review of systems was negative except for: Constitutional: positive for fatigue Musculoskeletal: positive for arthralgias   PHYSICAL EXAMINATION: General appearance: alert, cooperative, fatigued and no distress Head: Normocephalic, without obvious abnormality, atraumatic Neck: no adenopathy, no JVD, supple, symmetrical, trachea midline and thyroid not enlarged, symmetric, no tenderness/mass/nodules Lymph nodes: Cervical, supraclavicular, and axillary nodes normal. Resp: clear to auscultation bilaterally Back: symmetric, no curvature. ROM normal. No CVA tenderness. Cardio: regular rate and rhythm, S1, S2 normal, no murmur, click, rub or gallop GI: soft, non-tender; bowel sounds normal; no masses,  no organomegaly Extremities: extremities normal, atraumatic, no cyanosis or edema  ECOG PERFORMANCE STATUS: 2 - Symptomatic, <50% confined to bed  Blood pressure 126/75, pulse 86, temperature 98.6 F (37 C), temperature source Oral, resp. rate 18, height 4\' 7"  (1.397 m), weight 124 lb 14.4 oz (56.654 kg), SpO2 96 %.  LABORATORY DATA: Lab Results    Component Value Date   WBC 9.3 09/08/2015   HGB 12.6 09/08/2015   HCT 38.2 09/08/2015   MCV 94.9 09/08/2015   PLT 167 09/08/2015      Chemistry      Component Value Date/Time   NA 140 03/19/2015 1150   NA 143 02/21/2015 1140   K 4.9 03/19/2015 1150   K 4.7 02/21/2015 1140   CL 107 03/19/2015 1150   CO2 25 03/19/2015 1150   CO2 28 02/21/2015 1140   BUN 16 03/19/2015 1150   BUN 16.1 02/21/2015 1140   CREATININE 0.98* 03/19/2015 1150   CREATININE 1.0 02/21/2015 1140   CREATININE 0.69 02/06/2015 0520      Component Value Date/Time   CALCIUM 9.4 03/19/2015 1150   CALCIUM 9.3 02/21/2015 1140   ALKPHOS 87 03/17/2015 1650   ALKPHOS 58 02/21/2015 1140   AST 26 03/17/2015 1650   AST 24 02/21/2015 1140   ALT 63*  03/17/2015 1650   ALT 32 02/21/2015 1140   BILITOT 0.4 03/17/2015 1650   BILITOT 0.57 02/21/2015 1140       RADIOGRAPHIC STUDIES: No results found.  ASSESSMENT AND PLAN: This is a very pleasant 82 years old white female who was seen for evaluation of liver and splenic lesions and the recent PET scan showed no concerning findings and no hypermetabolic activity in these lesions. The findings on the PET scan are more consistent with vascular phenomenon versus hemangiomas. I discussed the PET scan with the patient and her family today CBC performed earlier today is unremarkable for any abnormality except mild neutrophilia. Compliance metabolic panel and LDH are still pending. I recommended for the patient to continue on observation with routine follow-up visit by her primary care physician. I would see her in as-needed basis at this point. The patient was advised to call immediately if she has any concerning symptoms in the interval. The patient voices understanding of current disease status and treatment options and is in agreement with the current care plan.  All questions were answered. The patient knows to call the clinic with any problems, questions or concerns. We  can certainly see the patient much sooner if necessary.  Disclaimer: This note was dictated with voice recognition software. Similar sounding words can inadvertently be transcribed and may not be corrected upon review.

## 2015-09-08 NOTE — Telephone Encounter (Signed)
I left a message on pt home phone to call about her appt. today

## 2015-09-30 ENCOUNTER — Ambulatory Visit (INDEPENDENT_AMBULATORY_CARE_PROVIDER_SITE_OTHER): Payer: Medicare Other | Admitting: Internal Medicine

## 2015-09-30 ENCOUNTER — Other Ambulatory Visit (INDEPENDENT_AMBULATORY_CARE_PROVIDER_SITE_OTHER): Payer: Medicare Other

## 2015-09-30 ENCOUNTER — Encounter: Payer: Self-pay | Admitting: Internal Medicine

## 2015-09-30 VITALS — BP 140/80 | HR 66 | Temp 98.0°F | Resp 20 | Wt 123.0 lb

## 2015-09-30 DIAGNOSIS — R6889 Other general symptoms and signs: Secondary | ICD-10-CM

## 2015-09-30 DIAGNOSIS — I5042 Chronic combined systolic (congestive) and diastolic (congestive) heart failure: Secondary | ICD-10-CM

## 2015-09-30 DIAGNOSIS — E7800 Pure hypercholesterolemia, unspecified: Secondary | ICD-10-CM

## 2015-09-30 DIAGNOSIS — M25439 Effusion, unspecified wrist: Secondary | ICD-10-CM | POA: Insufficient documentation

## 2015-09-30 DIAGNOSIS — Z0001 Encounter for general adult medical examination with abnormal findings: Secondary | ICD-10-CM

## 2015-09-30 DIAGNOSIS — M25432 Effusion, left wrist: Secondary | ICD-10-CM | POA: Diagnosis not present

## 2015-09-30 DIAGNOSIS — I10 Essential (primary) hypertension: Secondary | ICD-10-CM

## 2015-09-30 LAB — LIPID PANEL
CHOLESTEROL: 130 mg/dL (ref 0–200)
HDL: 66.6 mg/dL (ref >39.00)
LDL CALC: 51 mg/dL (ref 0–99)
NONHDL: 63.41
Total CHOL/HDL Ratio: 2
Triglycerides: 61 mg/dL (ref 0.0–149.0)
VLDL: 12.2 mg/dL (ref 0.0–40.0)

## 2015-09-30 NOTE — Assessment & Plan Note (Signed)
stable overall by history and exam, recent data reviewed with pt, and pt to continue medical treatment as before,  to f/u any worsening symptoms or concerns Lab Results  Component Value Date   LDLCALC 75 12/10/2013   For f/u lab today

## 2015-09-30 NOTE — Patient Instructions (Signed)

## 2015-09-30 NOTE — Progress Notes (Signed)
Subjective:    Patient ID: Cathy [name], female    DOB: 1927/08/05, 82 y.o.   MRN: [id number]  HPI  Here for wellness and f/u;  Overall doing ok;   Pt denies neurological change such as new headache, facial or extremity weakness.  Pt denies polydipsia, polyuria, or low sugar symptoms. Pt states overall good compliance with treatment and medications, good tolerability, and has been trying to follow appropriate diet.  Pt denies worsening depressive symptoms, suicidal ideation or panic. No fever, night sweats, wt loss, loss of appetite, or other constitutional symptoms.  Pt states good ability with ADL's, has low fall risk, home safety reviewed and adequate, no other significant changes in hearing or vision, and only occasionally active with exercise.Walks with cane, no recent falls  Pt denies chest pain, increased sob or doe, wheezing, orthopnea, PND, increased LE swelling, palpitations, dizziness or syncope.  Cleared for significant abnormality with respect to the hepatic/splenic lesions.  Also f/u yesterday for left wrist cystic mass after MRI June 2017., Has some numbness of the left 5th finger but no pain, weakness.  Did have the cyst drained with cortison at one point, but recurred 3 wks later. Stil no pain or weakness, so wants to holdi off on further tx for now    Due f/u cardiology oct 2017.  Past Medical History:  Diagnosis Date  . Allergic rhinitis   . Anemia   . Anxiety   . Chronic systolic CHF (congestive heart failure) ([place])    a. 2D Echo 02/02/15: EF 25-30%, akinesis of mid-apical anteroseptal myocardium, grade 3 DD, mod MR, severely dilated LA, mildly reduced RV systoluc function, mod dilated RA, mod TR, mildly increased PASP 82mmHg.  . Colitis    lymphocytic colitis feb 2011  . Coronary artery calcification    a. By CT 01/2015.  . Diverticulosis of colon   . DJD (degenerative joint disease)   . Dysphagia   . Hiatal hernia   . Hypercholesteremia   . Hypertension   . Iron  deficiency   . Irritable bowel syndrome   . Liver lesion 02/18/2015  . Lumbar back pain   . Osteoporosis   . Paroxysmal atrial flutter ([place])    a. Dx 01/2015 - underwent DCCV 03/2015.  [name] Sinus bradycardia   . Splenic lesion 02/18/2015  . Urinary incontinence   . Venous insufficiency   . Vitamin B12 deficiency    Past Surgical History:  Procedure Laterality Date  . ABDOMINAL HYSTERECTOMY    . CARDIOVERSION N/A 02/03/2015   Procedure: CARDIOVERSION;  Surgeon: Sueanne Margarita, MD;  [facility];  Service: Cardiovascular;  Laterality: N/A;  . CARDIOVERSION N/A 03/10/2015   Procedure: CARDIOVERSION;  Surgeon: Dorothy Spark, MD;  Location: [facility] ENDOSCOPY;  Service: Cardiovascular;  Laterality: N/A;  . cataract surgery    . decompressive lumbar laminectomy  06/2008   at L2-3, L3-4 and L4-5 by Dr. Ronnald Ramp  . LAPAROSCOPIC CHOLECYSTECTOMY  04/2009   Dr. Abran Cantor  . REPLACEMENT TOTAL KNEE    . TEE WITHOUT CARDIOVERSION N/A 02/03/2015   Procedure: TRANSESOPHAGEAL ECHOCARDIOGRAM (TEE);  Surgeon: Sueanne Margarita, MD;  Location[facility] ENDOSCOPY;  Service: Cardiovascular;  Laterality: N/A;    reports that she has quit smoking. Her smoking use included Cigarettes. She has a 5.00 pack-year smoking history. She has never used smokeless tobacco. She reports that she does not drink alcohol or use drugs. family history includes Diabetes in her brother; Heart attack in her brother and mother.  Allergies  Allergen Reactions  . Amoxicillin-Pot Clavulanate     REACTION: diarrhea  . Sulfonamide Derivatives Other (See Comments)    unknown   Current Outpatient Prescriptions on File Prior to Visit  Medication Sig Dispense Refill  . apixaban (ELIQUIS) 2.5 MG TABS tablet Take 1 tablet (2.5 mg total) by mouth 2 (two) times daily. 180 tablet 2  . atorvastatin (LIPITOR) 20 MG tablet Take 1 tablet (20 mg total) by mouth daily at 6 PM. 90 tablet 2  . calcium-vitamin D (OSCAL WITH D) 500-200 MG-UNIT per tablet  Take 1 tablet by mouth daily.     . cholecalciferol (VITAMIN D) 1000 UNITS tablet Take 1,000 Units by mouth daily.    . diclofenac (FLECTOR) 1.3 % PTCH Place 1 patch onto the skin every 12 (twelve) hours. Apply as directed by Dr. Brien Few    . esomeprazole (NEXIUM) 40 MG capsule TAKE 1 CAPSULE DAILY 90 capsule 2  . Ferrous Sulfate Dried (FEOSOL) 200 (65 FE) MG TABS Take 1 tablet by mouth 2 (two) times daily. Take with a vitamin c 500mg      . fexofenadine (ALLEGRA) 180 MG tablet Take 180 mg by mouth daily as needed for allergies or rhinitis.    [name] isosorbide mononitrate (IMDUR) 30 MG 24 hr tablet Take 1 tablet (30 mg total) by mouth daily. 30 tablet 6  . oxyCODONE-acetaminophen (PERCOCET) 5-325 MG per tablet Take 1 tablet by mouth every 6 (six) hours as needed for moderate pain.     . polyethylene glycol (MIRALAX / GLYCOLAX) packet Take 17 g by mouth 2 (two) times daily. 28 each 0  . pregabalin (LYRICA) 50 MG capsule Take 1 capsule (50 mg total) by mouth 3 (three) times daily. 270 capsule 1  . PROLIA 60 MG/ML SOLN injection Inject 60 mg into the skin every 6 (six) months.     . solifenacin (VESICARE) 10 MG tablet Take 1 tablet (10 mg total) by mouth daily. 90 tablet 1  . vitamin B-12 (CYANOCOBALAMIN) 1000 MCG tablet Take 1 tablet (1,000 mcg total) by mouth daily.     No current facility-administered medications on file prior to visit.    Review of Systems Constitutional: Negative for increased diaphoresis, or other activity, appetite or siginficant weight change other than noted HENT: Negative for worsening hearing loss, ear pain, facial swelling, mouth sores and neck stiffness.   Eyes: Negative for other worsening pain, redness or visual disturbance.  Respiratory: Negative for choking or stridor Cardiovascular: Negative for other chest pain and palpitations.  Gastrointestinal: Negative for worsening diarrhea, blood in stool, or abdominal distention Genitourinary: Negative for hematuria, flank  pain or change in urine volume.  Musculoskeletal: Negative for myalgias or other joint complaints.  Skin: Negative for other color change and wound or drainage.  Neurological: Negative for syncope and numbness. other than noted Hematological: Negative for adenopathy. or other swelling Psychiatric/Behavioral: Negative for hallucinations, SI, self-injury, decreased concentration or other worsening agitation.      Objective:   Physical Exam BP 140/80   Pulse 66   Temp 98 F (36.7 C) (Oral)   Resp 20   Wt 123 lb (55.8 kg)   SpO2 94%   BMI 28.59 kg/m  VS noted,  Constitutional: Pt is oriented to person, place, and time. Appears well-developed and well-nourished, in no significant distress Head: Normocephalic and atraumatic  Eyes: Conjunctivae and EOM are normal. Pupils are equal, round, and reactive to light Right Ear: External ear normal.  Left Ear: External ear  normal Nose: Nose normal.  Mouth/Throat: Oropharynx is clear and moist  Neck: Normal range of motion. Neck supple. No JVD present. No tracheal deviation present or significant neck LA or mass Cardiovascular: Normal rate, regular rhythm, normal heart sounds and intact distal pulses.   Pulmonary/Chest: Effort normal and breath sounds without rales or wheezing  Abdominal: Soft. Bowel sounds are normal. NT. No HSM  Musculoskeletal: Normal range of motion. Exhibits no edema Lymphadenopathy: Has no cervical adenopathy.  Neurological: Pt is alert and oriented to person, place, and time. Pt has normal reflexes. No cranial nerve deficit. Motor grossly intact Skin: Skin is warm and dry. No rash noted or new ulcers Psychiatric:  Has normal mood and affect. Behavior is normal.  Left wrist  - 3 cm area volar swelling just prox to the left wrist, o.w neurovasc intact Left hand + kyphosis marked thoracic spine , NT  Most recent MRI right wrist 6 6 2017 IMPRESSION: 1. Prominent tenosynovitis of the flexor tendons (except for the flexor  carpi tendons) proximal to and distal to the carpal tunnel, with fluid distention potentially causing some impingement on the ulnar artery and nerve just proximal and distal to Guyon's canal. 2. Torn scapholunate ligament with 6 mm scapholunate separation. 3. Severely deficient triangular fibrocartilage, with the TFCC disc not even visualized, and not playing a biomechanical role. 4. Amorphous volar ligamentous structures along the wrist, of doubtful integrity. 5. Synovitis and joint effusions involving the radiocarpal joint, distal radial ulnar joint, and first carpometacarpal articulation. 6. Deformities of the distal radius, distal ulna, and proximal carpal row, almost resembling a Madelung deformity. Subcortical marrow edema along the structures of the radiocarpal and ulnocarpal articulations. 7. Torn extensor carpi ulnaris at the level of the distal ulna.   Electronically Signed   By: Van Clines M.D.   On: 08/06/2015 08:41    Assessment & Plan:

## 2015-09-30 NOTE — Assessment & Plan Note (Signed)
stable overall by history and exam, recent data reviewed with pt, and pt to continue medical treatment as before,  to f/u any worsening symptoms or concerns BP Readings from Last 3 Encounters:  09/30/15 140/80  09/08/15 126/75  07/15/15 104/62

## 2015-09-30 NOTE — Assessment & Plan Note (Signed)

## 2015-09-30 NOTE — Assessment & Plan Note (Signed)
stable overall by history and exam, recent data reviewed with pt, and pt to continue medical treatment as before,  to f/u any worsening symptoms or concerns Lab Results  Component Value Date   WBC 9.3 09/08/2015   HGB 12.6 09/08/2015   HCT 38.2 09/08/2015   PLT 167 09/08/2015   GLUCOSE 159 (H) 09/08/2015   CHOL 153 12/10/2013   TRIG 53.0 12/10/2013   HDL 67.00 12/10/2013   LDLDIRECT 158.1 05/29/2008   LDLCALC 75 12/10/2013   ALT 23 09/08/2015   AST 36 (H) 09/08/2015   NA 143 09/08/2015   K 3.9 09/08/2015   CL 107 03/19/2015   CREATININE 1.0 09/08/2015   BUN 15.9 09/08/2015   CO2 26 09/08/2015   TSH 1.303 01/31/2015   INR 1.33 03/09/2010

## 2015-09-30 NOTE — Assessment & Plan Note (Addendum)
D/w pt, declines other consideration at this time, will cont to follow, f/u with hand surgury if worse  In addition to the time spent performing CPE, I spent an additional 15 minutes face to face,in which greater than 50% of this time was spent in counseling and coordination of care for patient's illness as documented.

## 2015-09-30 NOTE — Progress Notes (Signed)
Pre visit review using our clinic review tool, if applicable. No additional management support is needed unless otherwise documented below in the visit note. 

## 2015-10-06 ENCOUNTER — Telehealth: Payer: Self-pay | Admitting: Cardiology

## 2015-10-06 NOTE — Telephone Encounter (Signed)
New message       Request for surgical clearance:  What type of surgery is being performed?  Lumbar epidural When is this surgery scheduled?  10-21-15 Are there any medications that need to be held prior to surgery and how long?  Hold eliquis prior to procedure Name of physician performing surgery? Dr Brien Few What is your office phone and fax number? Fax [phone]

## 2015-10-09 NOTE — Telephone Encounter (Signed)
Pt on Eliquis for afib, no history of stroke. Ok to hold Eliquis x3 days prior to spinal injection. Clearance faxed to [phone].

## 2015-10-09 NOTE — Telephone Encounter (Signed)
Fax also received in Dr. Macky Lower box for the same issue. Will forward to [employer] (Pharm-D's) to review.

## 2015-11-19 ENCOUNTER — Ambulatory Visit (INDEPENDENT_AMBULATORY_CARE_PROVIDER_SITE_OTHER): Payer: Medicare Other

## 2015-11-19 ENCOUNTER — Ambulatory Visit (INDEPENDENT_AMBULATORY_CARE_PROVIDER_SITE_OTHER): Payer: Medicare Other | Admitting: Physician Assistant

## 2015-11-19 ENCOUNTER — Other Ambulatory Visit: Payer: Self-pay | Admitting: Physician Assistant

## 2015-11-19 ENCOUNTER — Ambulatory Visit: Payer: Medicare Other

## 2015-11-19 VITALS — BP 132/76 | HR 64 | Temp 97.4°F | Resp 16

## 2015-11-19 DIAGNOSIS — S60511A Abrasion of right hand, initial encounter: Secondary | ICD-10-CM

## 2015-11-19 DIAGNOSIS — S60512A Abrasion of left hand, initial encounter: Secondary | ICD-10-CM | POA: Diagnosis not present

## 2015-11-19 DIAGNOSIS — S0083XA Contusion of other part of head, initial encounter: Secondary | ICD-10-CM

## 2015-11-19 DIAGNOSIS — W19XXXA Unspecified fall, initial encounter: Secondary | ICD-10-CM

## 2015-11-19 DIAGNOSIS — M79604 Pain in right leg: Secondary | ICD-10-CM

## 2015-11-19 DIAGNOSIS — S299XXA Unspecified injury of thorax, initial encounter: Secondary | ICD-10-CM

## 2015-11-19 DIAGNOSIS — M25551 Pain in right hip: Secondary | ICD-10-CM

## 2015-11-19 IMAGING — DX DG SKULL 1-3V
3 series · 3 of 3 positions shown · non-contrast
Comparison: None.

CLINICAL DATA: Recent fall with right frontal hematoma

EXAM:
SKULL - 1-3 VIEW

[skull calldwell]
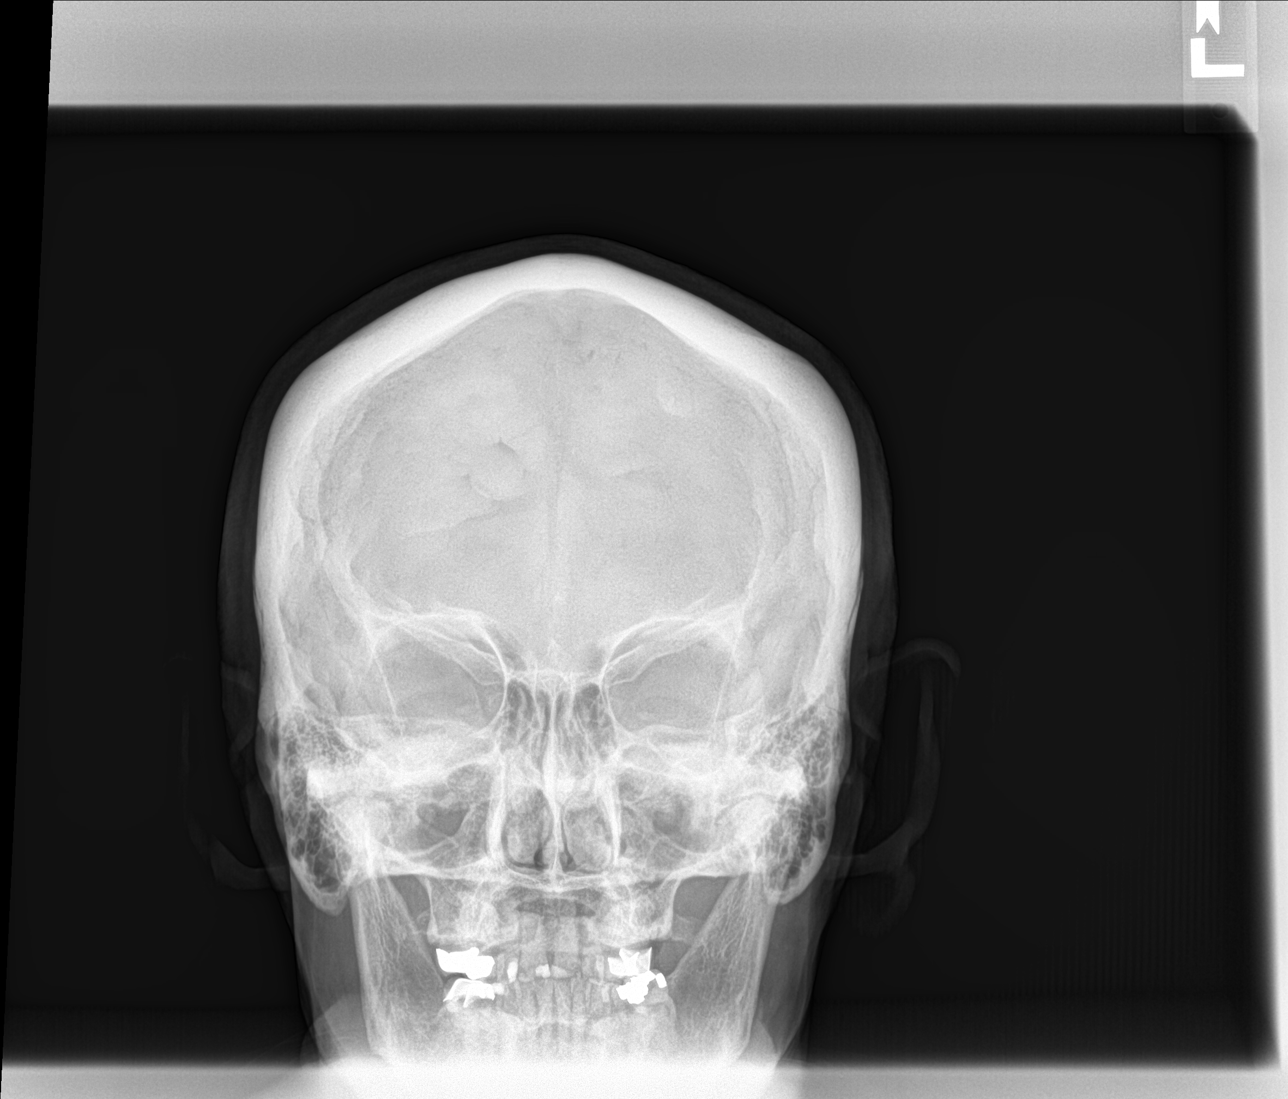

[skull lat (1 of 2)]
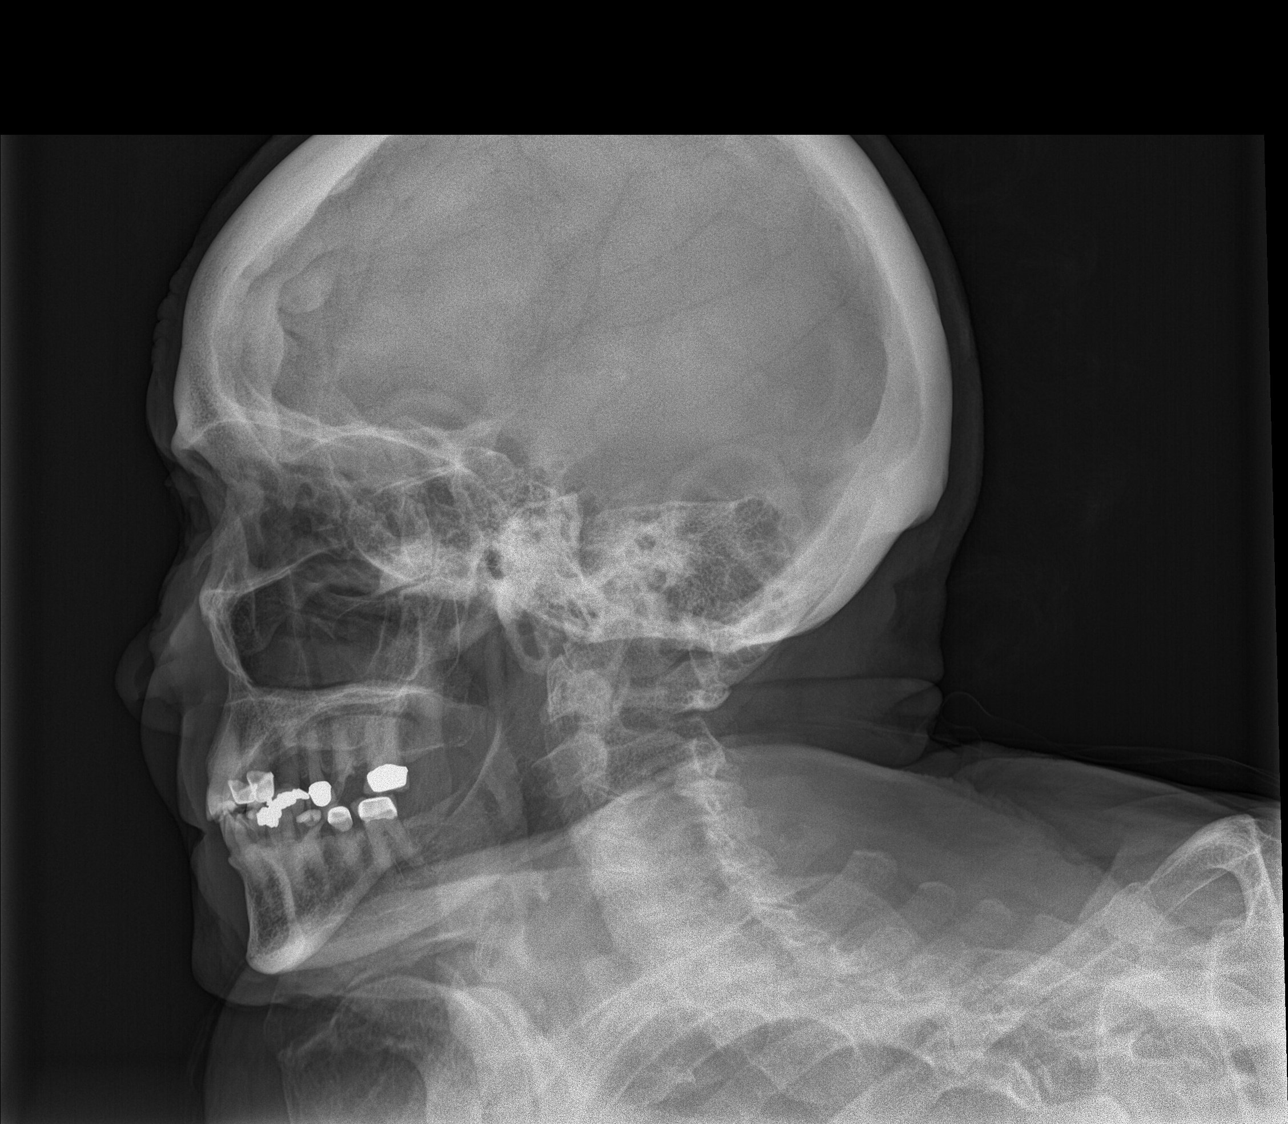

[skull lat (2 of 2)]
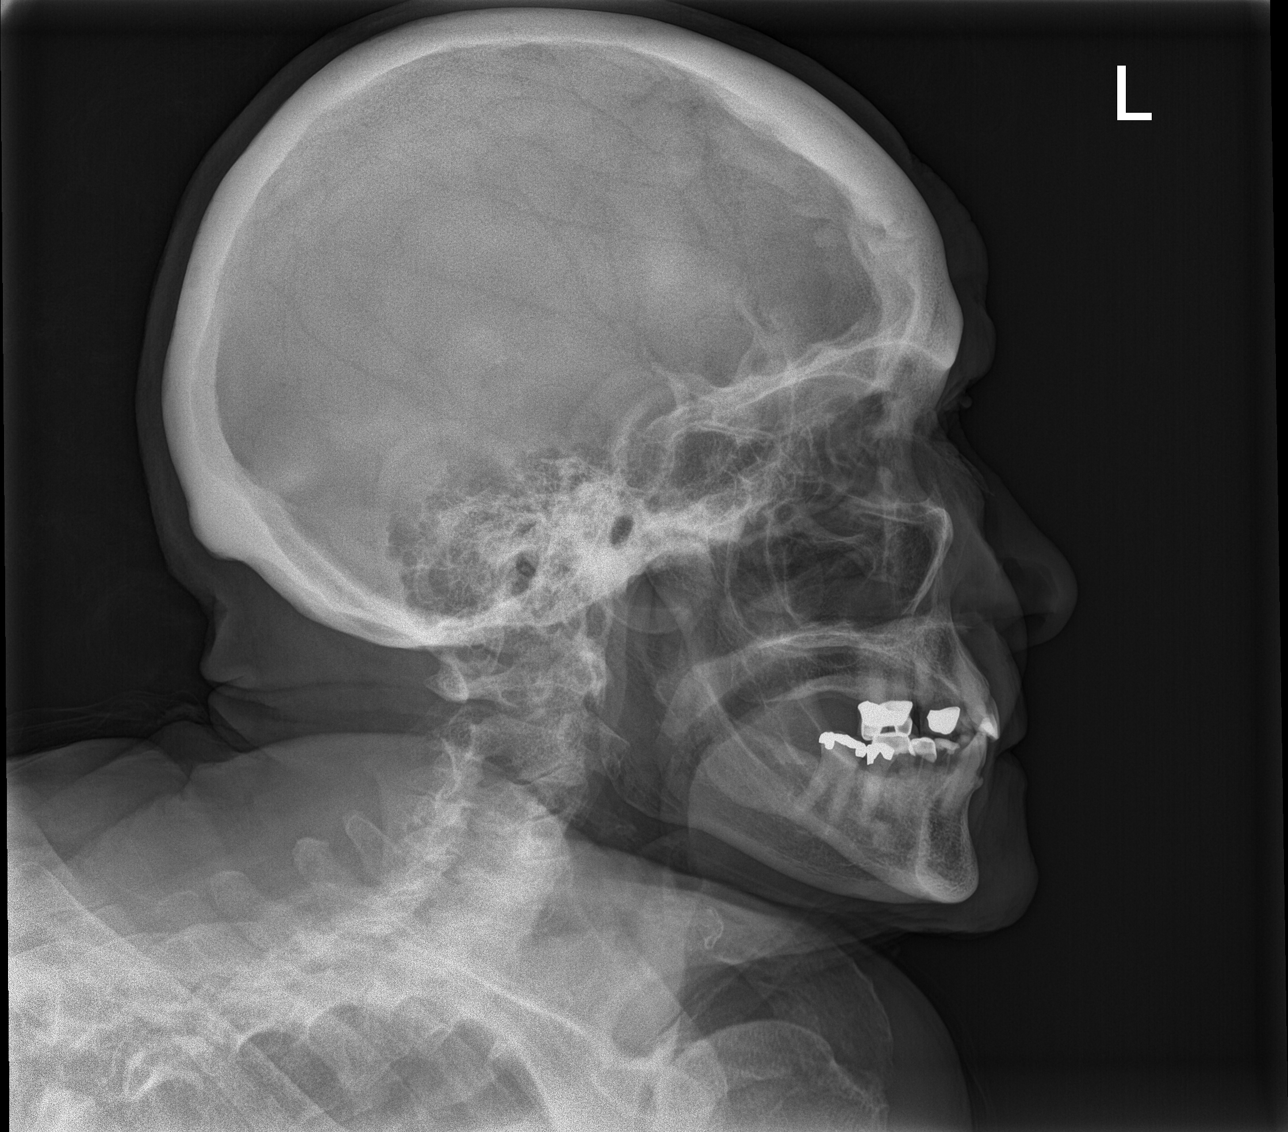

[3 of 3 positions shown; findings below may reference images not displayed]

FINDINGS: There is no evidence of skull fracture or other focal bone lesions.
IMPRESSION: No acute abnormality noted..

## 2015-11-19 NOTE — Patient Instructions (Addendum)
Take 2 tylenol every eight hours as needed for pain. Go straight to the ED at any point if you become confused, lethargic, or nauseas. You will likely be in more pain over the next 3-4 days as you recover from the fall.  Please rest as much as possible during this period.      IF you received an x-ray today, you will receive an invoice from [facility]. Please contact [facility] at [phone] with questions or concerns regarding your invoice.   IF you received labwork today, you will receive an invoice from [employer]. Please contact [facility] at [phone] with questions or concerns regarding your invoice.   Our billing staff will not be able to assist you with questions regarding bills from these companies.  You will be contacted with the lab results as soon as they are available. The fastest way to get your results is to activate your My Chart account. Instructions are located on the last page of this paperwork. If you have not heard from [place] regarding the results in 2 weeks, please contact this office.

## 2015-11-19 NOTE — Progress Notes (Signed)
11/19/2015 3:20 PM   DOB: [birth year] / MRN: [id number]  SUBJECTIVE:  Cathy [name] is a 82 y.o. female presenting for a fall that occurred today.  States she was going outside and tripped over the door sill.  Fell on her right side and hit her right sided frontal bone, bilateral hands and right sided rib. Complains of an abrasion on her right shin.  Denies HA, LOC, nausea.  She has eaten since the accident without difficulty. Denies hip pain. Her family is with her a reports she is behaving normally and daughter in law reports she is also walking normally.    She is allergic to amoxicillin-pot clavulanate and sulfonamide derivatives.   She  has a past medical history of Allergic rhinitis; Anemia; Anxiety; Chronic systolic CHF (congestive heart failure) ([place]); Colitis; Coronary artery calcification; Diverticulosis of colon; DJD (degenerative joint disease); Dysphagia; Hiatal hernia; Hypercholesteremia; Hypertension; Iron deficiency; Irritable bowel syndrome; Liver lesion (02/18/2015); Lumbar back pain; Osteoporosis; Paroxysmal atrial flutter ([place]); Sinus bradycardia; Splenic lesion (02/18/2015); Urinary incontinence; Venous insufficiency; and Vitamin B12 deficiency.    She  reports that she has quit smoking. Her smoking use included Cigarettes. She has a 5.00 pack-year smoking history. She has never used smokeless tobacco. She reports that she does not drink alcohol or use drugs. She  reports that she does not engage in sexual activity. The patient  has a past surgical history that includes cataract surgery; Abdominal hysterectomy; decompressive lumbar laminectomy (06/2008); Laparoscopic cholecystectomy (04/2009); Replacement total knee; TEE without cardioversion (N/A, 02/03/2015); Cardioversion (N/A, 02/03/2015); and Cardioversion (N/A, 03/10/2015).  Her family history includes Diabetes in her brother; Heart attack in her brother and mother.  Review of Systems  Constitutional: Negative for fever.  Eyes:  Negative for blurred vision.  Gastrointestinal: Negative for nausea.  Skin: Positive for rash.  Neurological: Negative for dizziness and tingling.    The problem list and medications were reviewed and updated by myself where necessary and exist elsewhere in the encounter.   OBJECTIVE:  BP 132/76 (BP Location: Left Arm, Patient Position: Sitting, Cuff Size: Normal)   Pulse 64   Temp 97.4 F (36.3 C) (Oral)   Resp 16   SpO2 96%   Physical Exam  Constitutional: She is oriented to person, place, and time.  HENT:  Head:    Cardiovascular: Normal rate and regular rhythm.   Pulmonary/Chest: Effort normal and breath sounds normal.  Musculoskeletal: Normal range of motion. She exhibits tenderness. She exhibits no edema.       Cervical back: She exhibits tenderness. She exhibits normal range of motion, no bony tenderness and no spasm.       Hands:      Legs: Neurological: She is alert and oriented to person, place, and time. She has normal strength. She displays no atrophy and no tremor. No cranial nerve deficit or sensory deficit. She exhibits normal muscle tone. She displays a negative Romberg sign. She displays no seizure activity. Coordination and gait normal. GCS eye subscore is 4. GCS verbal subscore is 5. GCS motor subscore is 6.  Psychiatric: She has a normal mood and affect. Her speech is normal and behavior is normal. Judgment and thought content normal. Cognition and memory are normal.    No results found for this or any previous visit (from the past 72 hour(s)).  Dg Skull 1-3 Views  Result Date: 11/19/2015 CLINICAL DATA:  Recent fall with right frontal hematoma EXAM: SKULL - 1-3 VIEW COMPARISON:  None. FINDINGS: There is  no evidence of skull fracture or other focal bone lesions. IMPRESSION: No acute abnormality noted. Electronically Signed   By: Inez Catalina M.D.   On: 11/19/2015 14:47   Dg Chest 2 View  Result Date: 11/19/2015 CLINICAL DATA:  82 year old female status post  fall. Right rib pain. Initial encounter. EXAM: CHEST  2 VIEW COMPARISON:  Chest CTA 02/01/2015 and earlier. FINDINGS: Chronic large gastric hiatal hernia. Stable cardiac size and mediastinal contours. Calcified aortic atherosclerosis. No pneumothorax. No pulmonary edema. Chronic lower lobe atelectasis. No pleural effusion. No acute displaced rib fracture identified. Bone mineralization is within normal limits for age. Scoliosis and widespread endplate degeneration in the spine. Exaggerated thoracic kyphosis. Degenerative changes at both shoulders. No acute osseous abnormality identified. Multiple surgical clips in the right abdomen. IMPRESSION: 1. No acute cardiopulmonary abnormality or acute traumatic injury identified. 2. Chronic severe gastric hiatal hernia with lung base atelectasis. 3.  Calcified aortic atherosclerosis. Electronically Signed   By: Genevie Ann M.D.   On: 11/19/2015 14:51   Dg Tibia/fibula Right  Result Date: 11/19/2015 CLINICAL DATA:  Leg pain hand abrasion.  Status post fall. EXAM: RIGHT TIBIA AND FIBULA - 2 VIEW COMPARISON:  None. FINDINGS: There is no evidence of fracture or other focal bone lesions. Right total knee arthroplasty. Focal area of soft tissue swelling involving the proximal anterior right tibia. IMPRESSION: No acute osseous injury of the right tibia or fibula. Focal area of soft tissue swelling involving the proximal anterior right tibia. Electronically Signed   By: Kathreen Devoid   On: 11/19/2015 14:53   Dg Hand 2 View Right  Result Date: 11/19/2015 CLINICAL DATA:  Fall, initial encounter EXAM: RIGHT HAND - 2 VIEW COMPARISON:  None. FINDINGS: Two views of the right hand submitted. There is diffuse osteopenia. Extensive osteoarthritic changes are noted radiocarpal joint. Significant narrowing of joint space and chondrocalcinosis. Extensive degenerative changes first carpometacarpal joint. Degenerative changes are noted distal interphalangeal joint second fourth and fifth  finger. Mild degenerative changes distal interphalangeal joint third finger. No acute fracture or subluxation. IMPRESSION: No acute fracture or subluxation. Osteoarthritic changes as described above. Diffuse osteopenia. Chondrocalcinosis. Electronically Signed   By: Lahoma Crocker M.D.   On: 11/19/2015 14:52   Dg Hand 2 View Left  Result Date: 11/19/2015 This is the back office imaging final text. See progress note for physician's narrative and interpretation.   Dg Hips Bilat W Or W/o Pelvis 2v  Result Date: 11/19/2015 CLINICAL DATA:  82 year old female status post fall. pain. Initial encounter. EXAM: DG HIP (WITH OR WITHOUT PELVIS) 2V BILAT COMPARISON:  CT Abdomen and Pelvis 01/31/2015. FINDINGS: Advanced disc and endplate degeneration in the lumbar spine with partially visible scoliosis. Previous lower lumbar posterior decompression. Pelvis intact. SI joints appear intact. Femoral heads normally located. Stable hip joint spaces. Mild for age acetabular sclerosis. Bilateral proximal femurs appear intact. IMPRESSION: No acute fracture or dislocation identified. Electronically Signed   By: Genevie Ann M.D.   On: 11/19/2015 14:53    ASSESSMENT AND PLAN  Cathy [name] was seen today for fall.  Diagnoses and all orders for this visit:  Fall, initial encounter: She has a history of osteoporosis.  She is taking a factor X inhibitor. No loss of consciousness, HA, nausea.  Rads negative for fracture. Advised 1000 mg of tylenol q8 for now.  Advised that she have her family stay with her or vice versa for the next few days so that she may rest adequately and receive help with ADLs  prn.  Advised family for a low threshold to go directly to the ED if any abnormal behavior.  -     DG Chest 2 View; Future -     Cancel: DG HANDS 1 VIEW BILAT BALLCATCHERS; Future -     DG HIPS BILAT W OR W/O PELVIS 2V; Future -     DG Tibia/Fibula Right; Future -     DG Skull 1-3 Views; Future -     DG Hand 2 View Right    The patient is  advised to call or return to clinic if she does not see an improvement in symptoms, or to seek the care of the closest emergency department if she worsens with the above plan.   Philis Fendt, MHS, PA-C [facility] 11/19/2015 3:20 PM

## 2015-11-28 ENCOUNTER — Ambulatory Visit (HOSPITAL_COMMUNITY)
Admission: RE | Admit: 2015-11-28 | Discharge: 2015-11-28 | Disposition: A | Discharge status: Home / Self Care | Payer: Medicare Other | Source: Ambulatory Visit | Attending: Cardiology | Admitting: Cardiology

## 2015-11-28 ENCOUNTER — Encounter: Payer: Self-pay | Admitting: Nurse Practitioner

## 2015-11-28 ENCOUNTER — Encounter (HOSPITAL_COMMUNITY): Payer: Medicare Other

## 2015-11-28 ENCOUNTER — Ambulatory Visit (INDEPENDENT_AMBULATORY_CARE_PROVIDER_SITE_OTHER): Payer: Medicare Other | Admitting: Nurse Practitioner

## 2015-11-28 VITALS — BP 106/68 | HR 86 | Temp 97.5°F | Ht 59.0 in | Wt 124.0 lb

## 2015-11-28 DIAGNOSIS — L089 Local infection of the skin and subcutaneous tissue, unspecified: Secondary | ICD-10-CM

## 2015-11-28 DIAGNOSIS — S8012XA Contusion of left lower leg, initial encounter: Secondary | ICD-10-CM | POA: Diagnosis not present

## 2015-11-28 DIAGNOSIS — E785 Hyperlipidemia, unspecified: Secondary | ICD-10-CM | POA: Insufficient documentation

## 2015-11-28 DIAGNOSIS — M7989 Other specified soft tissue disorders: Secondary | ICD-10-CM | POA: Diagnosis not present

## 2015-11-28 DIAGNOSIS — M79604 Pain in right leg: Secondary | ICD-10-CM | POA: Diagnosis not present

## 2015-11-28 DIAGNOSIS — M79605 Pain in left leg: Secondary | ICD-10-CM | POA: Diagnosis not present

## 2015-11-28 DIAGNOSIS — I2510 Atherosclerotic heart disease of native coronary artery without angina pectoris: Secondary | ICD-10-CM | POA: Diagnosis not present

## 2015-11-28 DIAGNOSIS — I10 Essential (primary) hypertension: Secondary | ICD-10-CM | POA: Diagnosis not present

## 2015-11-28 DIAGNOSIS — X58XXXA Exposure to other specified factors, initial encounter: Secondary | ICD-10-CM | POA: Diagnosis not present

## 2015-11-28 DIAGNOSIS — M79662 Pain in left lower leg: Secondary | ICD-10-CM

## 2015-11-28 MED ORDER — CLINDAMYCIN HCL 300 MG PO CAPS: 300.0000 mg | ORAL_CAPSULE | Freq: Three times a day (TID) | ORAL | 0 refills | Status: AC

## 2015-11-28 NOTE — Patient Instructions (Addendum)
Up to date with tetanus. Elevate leg as much as possible Wear compression stocking during day and off at night. You will be called with appt date and time for ultrasound. Resume eliquis.

## 2015-11-28 NOTE — Progress Notes (Signed)
Subjective:  Patient ID: Cathy [name], female    DOB: [birth year]  Age: 82 y.o. MRN: [id number]  CC: Leg Injury (Pt stated [place] and sore on leg/open wound)  [name] in [place] with granddaughter. Leg Pain   The incident occurred more than 1 week ago. The incident occurred at home. The injury mechanism was a fall (Tripped on a screen door pane). The pain is present in the left leg. The quality of the pain is described as aching and burning. The pain is at a severity of 6/10. The pain is moderate. The pain has been intermittent since onset. Associated symptoms include tingling. Pertinent negatives include no inability to bear weight, loss of motion, loss of sensation, muscle weakness or numbness. She reports no foreign bodies present. The symptoms are aggravated by movement, palpation and weight bearing. She has tried acetaminophen, elevation and rest for the symptoms. The treatment provided mild relief.  Also complains of worsening redness, drainage and pain in left lower extremity She was evaluated by another provider in urgent care. Leg x-ray indicates soft tissue swelling, no fracture.  Due to leg laceration and bleeding, she decided to stop Eliquis last week. Today she denies any chest pain, shortness of breath, abdominal pain, nausea, vomiting, headaches, change in vision, no dizziness, no syncope, no LOC.  Outpatient Medications Prior to Visit  Medication Sig Dispense Refill  . atorvastatin (LIPITOR) 20 MG tablet Take 1 tablet (20 mg total) by mouth daily at 6 PM. 90 tablet 2  . calcium-vitamin D (OSCAL WITH D) 500-200 MG-UNIT per tablet Take 1 tablet by mouth daily.     . cholecalciferol (VITAMIN D) 1000 UNITS tablet Take 1,000 Units by mouth daily.    . diclofenac (FLECTOR) 1.3 % PTCH Place 1 patch onto the skin every 12 (twelve) hours. Apply as directed by Dr. Brien Few    . esomeprazole (NEXIUM) 40 MG capsule TAKE 1 CAPSULE DAILY 90 capsule 2  . Ferrous Sulfate Dried  (FEOSOL) 200 (65 FE) MG TABS Take 1 tablet by mouth 2 (two) times daily. Take with a vitamin c 500mg      . fexofenadine (ALLEGRA) 180 MG tablet Take 180 mg by mouth daily as needed for allergies or rhinitis.    [name] isosorbide mononitrate (IMDUR) 30 MG 24 hr tablet Take 1 tablet (30 mg total) by mouth daily. 30 tablet 6  . oxyCODONE-acetaminophen (PERCOCET) 5-325 MG per tablet Take 1 tablet by mouth every 6 (six) hours as needed for moderate pain.     . polyethylene glycol (MIRALAX / GLYCOLAX) packet Take 17 g by mouth 2 (two) times daily. 28 each 0  . pregabalin (LYRICA) 50 MG capsule Take 1 capsule (50 mg total) by mouth 3 (three) times daily. 270 capsule 1  . PROLIA 60 MG/ML SOLN injection Inject 60 mg into the skin every 6 (six) months.     . solifenacin (VESICARE) 10 MG tablet Take 1 tablet (10 mg total) by mouth daily. 90 tablet 1  . vitamin B-12 (CYANOCOBALAMIN) 1000 MCG tablet Take 1 tablet (1,000 mcg total) by mouth daily.    [name] apixaban (ELIQUIS) 2.5 MG TABS tablet Take 1 tablet (2.5 mg total) by mouth 2 (two) times daily. (Patient not taking: Reported on 11/28/2015) 180 tablet 2   No facility-administered medications prior to visit.     ROS See HPI  Objective:  BP 106/68 (BP Location: Left Arm, Patient Position: Sitting, Cuff Size: Normal)   Pulse 86  Temp 97.5 F (36.4 C)   Ht 4\' 11"  (1.499 m)   Wt 124 lb (56.2 kg)   SpO2 97%   BMI 25.04 kg/m   BP Readings from Last 3 Encounters:  11/28/15 106/68  11/19/15 132/76  09/30/15 140/80    Wt Readings from Last 3 Encounters:  11/28/15 124 lb (56.2 kg)  09/30/15 123 lb (55.8 kg)  09/08/15 124 lb 14.4 oz (56.7 kg)    Physical Exam  Constitutional: She is oriented to person, place, and time. No distress.  Eyes: Conjunctivae and EOM are normal. Pupils are equal, round, and reactive to light.  Neck: Normal range of motion. Neck supple.  Cardiovascular: Normal rate and normal heart sounds.   Pulmonary/Chest: Effort normal and  breath sounds normal.  Abdominal: Soft. Bowel sounds are normal.  Musculoskeletal: She exhibits edema and tenderness.  Neurological: She is alert and oriented to person, place, and time.  Skin: Skin is warm and dry. Ecchymosis noted. There is erythema.  Ecchymosis on left forehead and left lower leg. Bilateral lower extremity edema, left worse than right. Laceration on left lower leg: unapproximated edges Small amount of serous drainage, no bleeding surrounding erythema, increased warmth.  Wound Care: Irrigated wound with saline, applied antibiotic ointment, covered with nonadherent gauze, Kerlix and Coban. Wound Care instructions given to patient and granddaughter.   Psychiatric: She has a normal mood and affect. Her behavior is normal.  Vitals reviewed.   Lab Results  Component Value Date   WBC 9.3 09/08/2015   HGB 12.6 09/08/2015   HCT 38.2 09/08/2015   PLT 167 09/08/2015   GLUCOSE 159 (H) 09/08/2015   CHOL 130 09/30/2015   TRIG 61.0 09/30/2015   HDL 66.60 09/30/2015   LDLDIRECT 158.1 05/29/2008   LDLCALC 51 09/30/2015   ALT 23 09/08/2015   AST 36 (H) 09/08/2015   NA 143 09/08/2015   K 3.9 09/08/2015   CL 107 03/19/2015   CREATININE 1.0 09/08/2015   BUN 15.9 09/08/2015   CO2 26 09/08/2015   TSH 1.303 01/31/2015   INR 1.33 03/09/2010    No results found.  Assessment & Plan:   Cathy [name] was seen today for leg injury.  Diagnoses and all orders for this visit:  Traumatic hematoma of left lower leg with infection, initial encounter -     VAS [place] LOWER EXTREMITY VENOUS (DVT); Future -     clindamycin (CLEOCIN) 300 MG capsule; Take 1 capsule (300 mg total) by mouth 3 (three) times daily.  Pain and swelling of left lower leg   I am having [name] start on clindamycin. I am also having her maintain her Ferrous Sulfate Dried, diclofenac, calcium-vitamin D, oxyCODONE-acetaminophen, PROLIA, solifenacin, vitamin B-12, cholecalciferol, fexofenadine, polyethylene glycol,  apixaban, atorvastatin, pregabalin, isosorbide mononitrate, and esomeprazole.  Meds ordered this encounter  Medications  . clindamycin (CLEOCIN) 300 MG capsule    Sig: Take 1 capsule (300 mg total) by mouth 3 (three) times daily.    Dispense:  21 capsule    Refill:  0    Order Specific Question:   Supervising Provider    Answer:   Cassandria Anger [[id number]]    Follow-up: Return if symptoms worsen or fail to improve.  Wilfred Lacy, NP

## 2015-11-28 NOTE — Progress Notes (Signed)
Pre visit review using our clinic review tool, if applicable. No additional management support is needed unless otherwise documented below in the visit note. 

## 2015-12-01 ENCOUNTER — Other Ambulatory Visit: Payer: Self-pay

## 2015-12-01 MED ORDER — PREGABALIN 50 MG PO CAPS: 50.0000 mg | ORAL_CAPSULE | Freq: Three times a day (TID) | ORAL | 1 refills | Status: DC

## 2015-12-01 NOTE — Progress Notes (Signed)
Disregard rx. It was not phoned in.   Please advise if it is okay to fill. thanks

## 2015-12-02 ENCOUNTER — Telehealth: Payer: Self-pay

## 2015-12-02 ENCOUNTER — Encounter (HOSPITAL_COMMUNITY): Payer: Medicare Other

## 2015-12-02 MED ORDER — PREGABALIN 50 MG PO CAPS: 50.0000 mg | ORAL_CAPSULE | Freq: Three times a day (TID) | ORAL | 1 refills | Status: DC

## 2015-12-02 NOTE — Telephone Encounter (Signed)
Patient requested lyrica refill to be sent to mail order----[facility]

## 2015-12-02 NOTE — Progress Notes (Signed)
Fazxed script to [facility]...Cathy [name]

## 2015-12-03 ENCOUNTER — Other Ambulatory Visit: Payer: Self-pay | Admitting: Cardiology

## 2015-12-09 ENCOUNTER — Ambulatory Visit (INDEPENDENT_AMBULATORY_CARE_PROVIDER_SITE_OTHER): Payer: Medicare Other | Admitting: Nurse Practitioner

## 2015-12-09 ENCOUNTER — Encounter: Payer: Self-pay | Admitting: Nurse Practitioner

## 2015-12-09 ENCOUNTER — Other Ambulatory Visit: Payer: Self-pay | Admitting: *Deleted

## 2015-12-09 VITALS — BP 112/86 | HR 86 | Temp 97.6°F | Wt 124.0 lb

## 2015-12-09 DIAGNOSIS — M7989 Other specified soft tissue disorders: Secondary | ICD-10-CM | POA: Diagnosis not present

## 2015-12-09 DIAGNOSIS — S8011XD Contusion of right lower leg, subsequent encounter: Secondary | ICD-10-CM

## 2015-12-09 NOTE — Progress Notes (Signed)
Subjective:  Patient ID: Cathy [name], female    DOB: 06/16/1927  Age: 82 y.o. MRN: [id number]  CC: Leg Swelling (Pt stated Rt leg is looking much better.)   HPI Presents with persistent left LE swelling post fall at home. She was treated for cellulitis,with oral abx. She has completed oral abx as prescribed. Leg pain and redness has resolved. She is concerned about persistent swelling and occassional drainage. Has not been using compression as previous instructed.   Outpatient Medications Prior to Visit  Medication Sig Dispense Refill  . atorvastatin (LIPITOR) 20 MG tablet Take 1 tablet (20 mg total) by mouth daily at 6 PM. 90 tablet 2  . calcium-vitamin D (OSCAL WITH D) 500-200 MG-UNIT per tablet Take 1 tablet by mouth daily.     . cholecalciferol (VITAMIN D) 1000 UNITS tablet Take 1,000 Units by mouth daily.    . diclofenac (FLECTOR) 1.3 % PTCH Place 1 patch onto the skin every 12 (twelve) hours. Apply as directed by Dr. Brien Few    . ELIQUIS 2.5 MG TABS tablet TAKE 1 TABLET TWICE A DAY 180 tablet 2  . esomeprazole (NEXIUM) 40 MG capsule TAKE 1 CAPSULE DAILY 90 capsule 2  . Ferrous Sulfate Dried (FEOSOL) 200 (65 FE) MG TABS Take 1 tablet by mouth 2 (two) times daily. Take with a vitamin c 500mg      . fexofenadine (ALLEGRA) 180 MG tablet Take 180 mg by mouth daily as needed for allergies or rhinitis.    [name] isosorbide mononitrate (IMDUR) 30 MG 24 hr tablet Take 1 tablet (30 mg total) by mouth daily. 30 tablet 6  . oxyCODONE-acetaminophen (PERCOCET) 5-325 MG per tablet Take 1 tablet by mouth every 6 (six) hours as needed for moderate pain.     . polyethylene glycol (MIRALAX / GLYCOLAX) packet Take 17 g by mouth 2 (two) times daily. 28 each 0  . pregabalin (LYRICA) 50 MG capsule Take 1 capsule (50 mg total) by mouth 3 (three) times daily. 270 capsule 1  . PROLIA 60 MG/ML SOLN injection Inject 60 mg into the skin every 6 (six) months.     . solifenacin (VESICARE) 10 MG tablet Take 1 tablet  (10 mg total) by mouth daily. 90 tablet 1  . vitamin B-12 (CYANOCOBALAMIN) 1000 MCG tablet Take 1 tablet (1,000 mcg total) by mouth daily.    . cephALEXin (KEFLEX) 500 MG capsule TAKE 2 CAPSULES BY MOUTH EVERY 2 HOURS PRIOR TO DENTAL PROCEDURE THEN 1 CAPSULE Q 6 H  3   No facility-administered medications prior to visit.     ROS See HPI  Objective:  BP 112/86 (BP Location: Left Arm, Patient Position: Sitting, Cuff Size: Normal)   Pulse 86   Temp 97.6 F (36.4 C)   Wt 124 lb (56.2 kg)   SpO2 96%   BMI 25.04 kg/m   BP Readings from Last 3 Encounters:  12/09/15 112/86  11/28/15 106/68  11/19/15 132/76    Wt Readings from Last 3 Encounters:  12/09/15 124 lb (56.2 kg)  11/28/15 124 lb (56.2 kg)  09/30/15 123 lb (55.8 kg)    Physical Exam  Constitutional: She is oriented to person, place, and time.  Musculoskeletal: Normal range of motion. She exhibits edema. She exhibits no tenderness.  Neurological: She is alert and oriented to person, place, and time.  Skin: Skin is warm and dry. No erythema.       Lab Results  Component Value Date   WBC 9.3 09/08/2015  HGB 12.6 09/08/2015   HCT 38.2 09/08/2015   PLT 167 09/08/2015   GLUCOSE 159 (H) 09/08/2015   CHOL 130 09/30/2015   TRIG 61.0 09/30/2015   HDL 66.60 09/30/2015   LDLDIRECT 158.1 05/29/2008   LDLCALC 51 09/30/2015   ALT 23 09/08/2015   AST 36 (H) 09/08/2015   NA 143 09/08/2015   K 3.9 09/08/2015   CL 107 03/19/2015   CREATININE 1.0 09/08/2015   BUN 15.9 09/08/2015   CO2 26 09/08/2015   TSH 1.303 01/31/2015   INR 1.33 03/09/2010    No results found.  Assessment & Plan:   [name] was seen today for leg swelling.  Diagnoses and all orders for this visit:  Left leg swelling -     Compression stockings  Traumatic hematoma of right lower leg, subsequent encounter -     Compression stockings   I am having [name] maintain her Ferrous Sulfate Dried, diclofenac, calcium-vitamin D,  oxyCODONE-acetaminophen, PROLIA, solifenacin, vitamin B-12, cholecalciferol, fexofenadine, polyethylene glycol, atorvastatin, isosorbide mononitrate, esomeprazole, pregabalin, ELIQUIS, and cephALEXin.  No orders of the defined types were placed in this encounter.   Follow-up: Return if symptoms worsen or fail to improve.  Wilfred Lacy, NP

## 2015-12-09 NOTE — Progress Notes (Unsigned)
Pre visit review using our clinic review tool, if applicable. No additional management support is needed unless otherwise documented below in the visit note. 

## 2015-12-09 NOTE — Progress Notes (Signed)
Pre visit review using our clinic review tool, if applicable. No additional management support is needed unless otherwise documented below in the visit note. 

## 2015-12-09 NOTE — Patient Instructions (Addendum)
Advised to use compression during the day and off at bedtime. Return to office if pain and redness returns.  Venous doppler was negative for DVT (11/28/15).

## 2015-12-22 ENCOUNTER — Ambulatory Visit (INDEPENDENT_AMBULATORY_CARE_PROVIDER_SITE_OTHER): Payer: Medicare Other | Admitting: Family Medicine

## 2015-12-22 VITALS — BP 118/76 | HR 78 | Temp 98.2°F | Resp 17 | Ht 59.0 in | Wt 125.0 lb

## 2015-12-22 DIAGNOSIS — N3000 Acute cystitis without hematuria: Secondary | ICD-10-CM

## 2015-12-22 DIAGNOSIS — R300 Dysuria: Secondary | ICD-10-CM | POA: Diagnosis not present

## 2015-12-22 LAB — POCT URINALYSIS DIP (MANUAL ENTRY)
Bilirubin, UA: NEGATIVE
Glucose, UA: NEGATIVE
Nitrite, UA: POSITIVE — AB
PH UA: 5.5
SPEC GRAV UA: 1.025
Urobilinogen, UA: 0.2

## 2015-12-22 LAB — POC MICROSCOPIC URINALYSIS (UMFC): Mucus: ABSENT

## 2015-12-22 MED ORDER — CEFTRIAXONE SODIUM 1 G IJ SOLR
1.0000 g | Freq: Once | INTRAMUSCULAR | Status: AC
  Administered 2015-12-22: 1 g via INTRAMUSCULAR

## 2015-12-22 MED ORDER — CEPHALEXIN 500 MG PO CAPS: 500.0000 mg | ORAL_CAPSULE | Freq: Two times a day (BID) | ORAL | 0 refills | Status: AC

## 2015-12-22 NOTE — Patient Instructions (Addendum)
     IF you received an x-ray today, you will receive an invoice from [facility]. Please contact [facility] at [phone] with questions or concerns regarding your invoice.   IF you received labwork today, you will receive an invoice from [employer]. Please contact [facility] at [phone] with questions or concerns regarding your invoice.   Our billing staff will not be able to assist you with questions regarding bills from these companies.  You will be contacted with the lab results as soon as they are available. The fastest way to get your results is to activate your My Chart account. Instructions are located on the last page of this paperwork. If you have not heard from [place] regarding the results in 2 weeks, please contact this office.      Urinary Tract Infection A urinary tract infection (UTI) can occur any place along the urinary tract. The tract includes the kidneys, ureters, bladder, and urethra. A type of germ called bacteria often causes a UTI. UTIs are often helped with antibiotic medicine.  HOME CARE   If given, take antibiotics as told by your doctor. Finish them even if you start to feel better.  Drink enough fluids to keep your pee (urine) clear or pale yellow.  Avoid tea, drinks with caffeine, and bubbly (carbonated) drinks.  Pee often. Avoid holding your pee in for a long time.  Pee before and after having sex (intercourse).  Wipe from front to back after you poop (bowel movement) if you are a woman. Use each tissue only once. GET HELP RIGHT AWAY IF:   You have back pain.  You have lower belly (abdominal) pain.  You have chills.  You feel sick to your stomach (nauseous).  You throw up (vomit).  Your burning or discomfort with peeing does not go away.  You have a fever.  Your symptoms are not better in 3 days. MAKE SURE YOU:   Understand these instructions.  Will watch your condition.  Will get help  right away if you are not doing well or get worse.   This information is not intended to replace advice given to you by your health care provider. Make sure you discuss any questions you have with your health care provider.   Document Released: 08/04/2007 Document Revised: 03/08/2014 Document Reviewed: 09/16/2011 Elsevier Interactive Patient Education [employer].

## 2015-12-22 NOTE — Progress Notes (Signed)
Chief Complaint  Patient presents with  . Dysuria    Started 2hours ago.     HPI  Urinary Tract Infection: Patient complains of burning with urination, frequency, hesitancy and inability to void She has had symptoms since this morning. Patient also complains of bladder pain. Patient denies fever. Patient does not have a history of recurrent UTI.  Patient does not have a history of pyelonephritis.   Past Medical History:  Diagnosis Date  . Allergic rhinitis   . Anemia   . Anxiety   . Chronic systolic CHF (congestive heart failure) ([place])    a. 2D Echo 02/02/15: EF 25-30%, akinesis of mid-apical anteroseptal myocardium, grade 3 DD, mod MR, severely dilated LA, mildly reduced RV systoluc function, mod dilated RA, mod TR, mildly increased PASP 26mmHg.  . Colitis    lymphocytic colitis feb 2011  . Coronary artery calcification    a. By CT 01/2015.  . Diverticulosis of colon   . DJD (degenerative joint disease)   . Dysphagia   . Hiatal hernia   . Hypercholesteremia   . Hypertension   . Iron deficiency   . Irritable bowel syndrome   . Liver lesion 02/18/2015  . Lumbar back pain   . Osteoporosis   . Paroxysmal atrial flutter ([place])    a. Dx 01/2015 - underwent DCCV 03/2015.  [name] Sinus bradycardia   . Splenic lesion 02/18/2015  . Urinary incontinence   . Venous insufficiency   . Vitamin B12 deficiency     Current Outpatient Prescriptions  Medication Sig Dispense Refill  . atorvastatin (LIPITOR) 20 MG tablet Take 1 tablet (20 mg total) by mouth daily at 6 PM. 90 tablet 2  . calcium-vitamin D (OSCAL WITH D) 500-200 MG-UNIT per tablet Take 1 tablet by mouth daily.     . cholecalciferol (VITAMIN D) 1000 UNITS tablet Take 1,000 Units by mouth daily.    . diclofenac (FLECTOR) 1.3 % PTCH Place 1 patch onto the skin every 12 (twelve) hours. Apply as directed by Dr. Brien Few    . ELIQUIS 2.5 MG TABS tablet TAKE 1 TABLET TWICE A DAY 180 tablet 2  . esomeprazole (NEXIUM) 40 MG capsule TAKE 1  CAPSULE DAILY 90 capsule 2  . Ferrous Sulfate Dried (FEOSOL) 200 (65 FE) MG TABS Take 1 tablet by mouth 2 (two) times daily. Take with a vitamin c 500mg      . fexofenadine (ALLEGRA) 180 MG tablet Take 180 mg by mouth daily as needed for allergies or rhinitis.    [name] isosorbide mononitrate (IMDUR) 30 MG 24 hr tablet Take 1 tablet (30 mg total) by mouth daily. 30 tablet 6  . oxyCODONE-acetaminophen (PERCOCET) 5-325 MG per tablet Take 1 tablet by mouth every 6 (six) hours as needed for moderate pain.     . polyethylene glycol (MIRALAX / GLYCOLAX) packet Take 17 g by mouth 2 (two) times daily. 28 each 0  . pregabalin (LYRICA) 50 MG capsule Take 1 capsule (50 mg total) by mouth 3 (three) times daily. 270 capsule 1  . PROLIA 60 MG/ML SOLN injection Inject 60 mg into the skin every 6 (six) months.     . solifenacin (VESICARE) 10 MG tablet Take 1 tablet (10 mg total) by mouth daily. 90 tablet 1  . cephALEXin (KEFLEX) 500 MG capsule Take 1 capsule (500 mg total) by mouth 2 (two) times daily. 6 capsule 0  . vitamin B-12 (CYANOCOBALAMIN) 1000 MCG tablet Take 1 tablet (1,000 mcg total) by mouth daily. (Patient not  taking: Reported on 12/22/2015)     Current Facility-Administered Medications  Medication Dose Route Frequency Provider Last Rate Last Dose  . cefTRIAXone (ROCEPHIN) injection 1 g  1 g Intramuscular Once Forrest Moron, MD        Allergies:  Allergies  Allergen Reactions  . Amoxicillin-Pot Clavulanate     REACTION: diarrhea  . Sulfonamide Derivatives Other (See Comments)    unknown    Past Surgical History:  Procedure Laterality Date  . ABDOMINAL HYSTERECTOMY    . CARDIOVERSION N/A 02/03/2015   Procedure: CARDIOVERSION;  Surgeon: Sueanne Margarita, MD;  Location: [facility] ENDOSCOPY;  Service: Cardiovascular;  Laterality: N/A;  . CARDIOVERSION N/A 03/10/2015   Procedure: CARDIOVERSION;  Surgeon: Dorothy Spark, MD;  Location: [facility] ENDOSCOPY;  Service: Cardiovascular;  Laterality: N/A;  . cataract  surgery    . decompressive lumbar laminectomy  06/2008   at L2-3, L3-4 and L4-5 by Dr. Ronnald Ramp  . LAPAROSCOPIC CHOLECYSTECTOMY  04/2009   Dr. Abran Cantor  . REPLACEMENT TOTAL KNEE    . TEE WITHOUT CARDIOVERSION N/A 02/03/2015   Procedure: TRANSESOPHAGEAL ECHOCARDIOGRAM (TEE);  Surgeon: Sueanne Margarita, MD;  Location: [facility] ENDOSCOPY;  Service: Cardiovascular;  Laterality: N/A;    Social History   Social History  . Marital status: Widowed    Spouse name: N/A  . Number of children: N/A  . Years of education: N/A   Occupational History  . retired    Social History Main Topics  . Smoking status: Former Smoker    Packs/day: 0.50    Years: 10.00    Types: Cigarettes  . Smokeless tobacco: Never Used  . Alcohol use No  . Drug use: No  . Sexual activity: No   Other Topics Concern  . None   Social History Narrative  . None    ROS  Objective: Vitals:   12/22/15 1409  BP: 118/76  Pulse: 78  Resp: 17  Temp: 98.2 F (36.8 C)  TempSrc: Oral  SpO2: 95%  Weight: 125 lb (56.7 kg)  Height: 4\' 11"  (1.499 m)    Physical Exam  Constitutional: She is oriented to person, place, and time. She appears well-developed and well-nourished.  HENT:  Head: Normocephalic and atraumatic.  Cardiovascular:  Irregular rhythm  Abdominal: Soft. Bowel sounds are normal. She exhibits no distension.  No flank pain   Neurological: She is alert and oriented to person, place, and time.    Assessment and Plan Cathy [name] was seen today for dysuria.  Diagnoses and all orders for this visit:  Dysuria -     POCT urinalysis dipstick -     POCT Microscopic Urinalysis ([facility])  Acute cystitis without hematuria- since pt has tolerated Rocephin in the past.  Will repeat Rocephin and follow up with 3 days of Keflex.  Gave instructions to stay hydrated -     Urine culture -     cefTRIAXone (ROCEPHIN) injection 1 g; Inject 1 g into the muscle once.  Other orders -     cephALEXin (KEFLEX) 500 MG capsule; Take 1  capsule (500 mg total) by mouth 2 (two) times daily.     [place]

## 2015-12-24 LAB — URINE CULTURE

## 2016-01-04 ENCOUNTER — Other Ambulatory Visit: Payer: Self-pay | Admitting: Internal Medicine

## 2016-02-02 ENCOUNTER — Telehealth: Payer: Self-pay | Admitting: Cardiology

## 2016-02-02 NOTE — Telephone Encounter (Signed)
I have not seen pt in a year- check with Dr. Curt Bears he has seen her most recently and follows her atrial flutter.

## 2016-02-02 NOTE — Telephone Encounter (Signed)
Requesting surgical clearance:  1. Type of surgery: ESI Lumbar  2. Surgeon: Dr. Marlaine Hind  3.Surgical Date: pending clearance  4. Medications that need to be held: Eliquis for 3-5 days prior to procedure   5. CAD: Yes  6. I will defer to:  Cecilie Kicks, NP   Contact Information:   [facility] Phone: [phone] Fax:  [phone]

## 2016-02-03 NOTE — Telephone Encounter (Signed)
Ok, will route to Dr. Curt Bears. Thank you!

## 2016-02-04 NOTE — Telephone Encounter (Signed)
Can hold eliquis for injection.  Should restart when stable from procedure.

## 2016-02-05 ENCOUNTER — Encounter: Payer: Self-pay | Admitting: Cardiology

## 2016-02-05 NOTE — Telephone Encounter (Signed)
Clearance faxed to number provided via EPIC. 

## 2016-03-09 ENCOUNTER — Other Ambulatory Visit: Payer: Self-pay | Admitting: Cardiology

## 2016-03-26 ENCOUNTER — Other Ambulatory Visit: Payer: Self-pay | Admitting: Internal Medicine

## 2016-04-01 ENCOUNTER — Ambulatory Visit (INDEPENDENT_AMBULATORY_CARE_PROVIDER_SITE_OTHER): Payer: Medicare Other | Admitting: Internal Medicine

## 2016-04-01 ENCOUNTER — Other Ambulatory Visit (INDEPENDENT_AMBULATORY_CARE_PROVIDER_SITE_OTHER): Payer: Medicare Other

## 2016-04-01 VITALS — BP 128/78 | HR 63 | Temp 98.4°F | Resp 20 | Wt 123.2 lb

## 2016-04-01 DIAGNOSIS — R739 Hyperglycemia, unspecified: Secondary | ICD-10-CM

## 2016-04-01 DIAGNOSIS — M79601 Pain in right arm: Secondary | ICD-10-CM | POA: Diagnosis not present

## 2016-04-01 DIAGNOSIS — I10 Essential (primary) hypertension: Secondary | ICD-10-CM

## 2016-04-01 DIAGNOSIS — Z23 Encounter for immunization: Secondary | ICD-10-CM

## 2016-04-01 DIAGNOSIS — Z0001 Encounter for general adult medical examination with abnormal findings: Secondary | ICD-10-CM

## 2016-04-01 LAB — URINALYSIS, ROUTINE W REFLEX MICROSCOPIC
BILIRUBIN URINE: NEGATIVE
Hgb urine dipstick: NEGATIVE
KETONES UR: NEGATIVE
Leukocytes, UA: NEGATIVE
NITRITE: NEGATIVE
PH: 7 (ref 5.0–8.0)
Specific Gravity, Urine: <=1.005 — AB (ref 1.000–1.030)
TOTAL PROTEIN, URINE-UPE24: NEGATIVE
URINE GLUCOSE: NEGATIVE
Urobilinogen, UA: 0.2 (ref 0.0–1.0)

## 2016-04-01 LAB — CBC WITH DIFFERENTIAL/PLATELET
BASOS PCT: 0.7 % (ref 0.0–3.0)
Basophils Absolute: 0 10*3/uL (ref 0.0–0.1)
EOS ABS: 0.1 10*3/uL (ref 0.0–0.7)
Eosinophils Relative: 1.3 % (ref 0.0–5.0)
HCT: 38.8 % (ref 36.0–46.0)
HEMOGLOBIN: 13.1 g/dL (ref 12.0–15.0)
LYMPHS ABS: 1 10*3/uL (ref 0.7–4.0)
Lymphocytes Relative: 14.9 % (ref 12.0–46.0)
MCHC: 33.8 g/dL (ref 30.0–36.0)
MCV: 90.5 fl (ref 78.0–100.0)
MONO ABS: 0.7 10*3/uL (ref 0.1–1.0)
Monocytes Relative: 9.8 % (ref 3.0–12.0)
NEUTROS PCT: 73.3 % (ref 43.0–77.0)
Neutro Abs: 5 10*3/uL (ref 1.4–7.7)
PLATELETS: 173 10*3/uL (ref 150.0–400.0)
RBC: 4.28 Mil/uL (ref 3.87–5.11)
RDW: 13.7 % (ref 11.5–15.5)
WBC: 6.8 10*3/uL (ref 4.0–10.5)

## 2016-04-01 LAB — LIPID PANEL
CHOL/HDL RATIO: 2
CHOLESTEROL: 132 mg/dL (ref 0–200)
HDL: 64.8 mg/dL (ref >39.00)
LDL Cholesterol: 56 mg/dL (ref 0–99)
NonHDL: 66.91
TRIGLYCERIDES: 57 mg/dL (ref 0.0–149.0)
VLDL: 11.4 mg/dL (ref 0.0–40.0)

## 2016-04-01 LAB — BASIC METABOLIC PANEL
BUN: 13 mg/dL (ref 6–23)
CHLORIDE: 104 meq/L (ref 96–112)
CO2: 33 mEq/L — ABNORMAL HIGH (ref 19–32)
Calcium: 9.9 mg/dL (ref 8.4–10.5)
Creatinine, Ser: 0.74 mg/dL (ref 0.40–1.20)
GFR: 79 mL/min (ref >60.00)
GLUCOSE: 81 mg/dL (ref 70–99)
POTASSIUM: 4.2 meq/L (ref 3.5–5.1)
Sodium: 142 mEq/L (ref 135–145)

## 2016-04-01 LAB — HEPATIC FUNCTION PANEL
ALT: 66 U/L — AB (ref 0–35)
AST: 24 U/L (ref 0–37)
Albumin: 4 g/dL (ref 3.5–5.2)
Alkaline Phosphatase: 88 U/L (ref 39–117)
BILIRUBIN TOTAL: 0.4 mg/dL (ref 0.2–1.2)
Bilirubin, Direct: 0.1 mg/dL (ref 0.0–0.3)
Total Protein: 6.5 g/dL (ref 6.0–8.3)

## 2016-04-01 LAB — HEMOGLOBIN A1C: HEMOGLOBIN A1C: 5.6 % (ref 4.6–6.5)

## 2016-04-01 LAB — TSH: TSH: 2.46 u[IU]/mL (ref 0.35–4.50)

## 2016-04-01 NOTE — Patient Instructions (Addendum)
You had the flu shot today  Please continue all other medications as before, and refills have been done if requested.  Please have the pharmacy call with any other refills you may need.  Please continue your efforts at being more active, low cholesterol diet, and weight control.  You are otherwise up to date with prevention measures today.  Please see Dr Smith/sports medicine in this office if the right arm pain returns  Please keep your appointments with your specialists as you may have planned  Please go to the LAB in the Basement (turn left off the elevator) for the tests to be done today  You will be contacted by phone if any changes need to be made immediately.  Otherwise, you will receive a letter about your results with an explanation, but please check with MyChart first.  Please remember to sign up for MyChart if you have not done so, as this will be important to you in the future with finding out test results, communicating by private email, and scheduling acute appointments online when needed.  Please return in 6 months, or sooner if needed

## 2016-04-01 NOTE — Assessment & Plan Note (Signed)

## 2016-04-01 NOTE — Progress Notes (Signed)
Subjective:    Patient ID: Cathy [name], female    DOB: 05-Apr-1927, 83 y.o.   MRN: [id number]  HPI  Here for wellness and f/u;  Overall doing ok;  Pt denies Chest pain, worsening SOB, DOE, wheezing, orthopnea, PND, worsening LE edema, palpitations, dizziness or syncope.  Pt denies neurological change such as new headache, facial or extremity weakness.  Pt denies polydipsia, polyuria, or low sugar symptoms. Pt states overall good compliance with treatment and medications, good tolerability, and has been trying to follow appropriate diet.  Pt denies worsening depressive symptoms, suicidal ideation or panic. No fever, night sweats, wt loss, loss of appetite, or other constitutional symptoms.  Pt states good ability with ADL's, has low fall risk, home safety reviewed and adequate, no other significant changes in hearing or vision, and not active with exercise but is fairly spry for age, only uses cane when leaves the house.  C/o right arm pain, dull and sharp at times, to mid right arm mostly anteriorly/bicep without swelling, redness ,, skin change or recent trauma.  Not worse with shoulder movement. Nothing else makes better or worse Past Medical History:  Diagnosis Date  . Allergic rhinitis   . Anemia   . Anxiety   . Chronic systolic CHF (congestive heart failure) ([place])    a. 2D Echo 02/02/15: EF 25-30%, akinesis of mid-apical anteroseptal myocardium, grade 3 DD, mod MR, severely dilated LA, mildly reduced RV systoluc function, mod dilated RA, mod TR, mildly increased PASP 82mmHg.  . Colitis    lymphocytic colitis feb 2011  . Coronary artery calcification    a. By CT 01/2015.  . Diverticulosis of colon   . DJD (degenerative joint disease)   . Dysphagia   . Hiatal hernia   . Hypercholesteremia   . Hypertension   . Iron deficiency   . Irritable bowel syndrome   . Liver lesion 02/18/2015  . Lumbar back pain   . Osteoporosis   . Paroxysmal atrial flutter ([place])    a. Dx 01/2015 - underwent  DCCV 03/2015.  [name] Sinus bradycardia   . Splenic lesion 02/18/2015  . Urinary incontinence   . Venous insufficiency   . Vitamin B12 deficiency    Past Surgical History:  Procedure Laterality Date  . ABDOMINAL HYSTERECTOMY    . CARDIOVERSION N/A 02/03/2015   Procedure: CARDIOVERSION;  Surgeon: Sueanne Margarita, MD;  Location: [facility] ENDOSCOPY;  Service: Cardiovascular;  Laterality: N/A;  . CARDIOVERSION N/A 03/10/2015   Procedure: CARDIOVERSION;  Surgeon: Dorothy Spark, MD;  Location: [facility] ENDOSCOPY;  Service: Cardiovascular;  Laterality: N/A;  . cataract surgery    . decompressive lumbar laminectomy  06/2008   at L2-3, L3-4 and L4-5 by Dr. Ronnald Ramp  . LAPAROSCOPIC CHOLECYSTECTOMY  04/2009   Dr. Abran Cantor  . REPLACEMENT TOTAL KNEE    . TEE WITHOUT CARDIOVERSION N/A 02/03/2015   Procedure: TRANSESOPHAGEAL ECHOCARDIOGRAM (TEE);  Surgeon: Sueanne Margarita, MD;  Location: [facility] ENDOSCOPY;  Service: Cardiovascular;  Laterality: N/A;    reports that she has quit smoking. Her smoking use included Cigarettes. She has a 5.00 pack-year smoking history. She has never used smokeless tobacco. She reports that she does not drink alcohol or use drugs. family history includes Diabetes in her brother; Heart attack in her brother and mother. Allergies  Allergen Reactions  . Amoxicillin-Pot Clavulanate     REACTION: diarrhea  . Sulfonamide Derivatives Other (See Comments)    unknown   Current Outpatient Prescriptions on File Prior to  Visit  Medication Sig Dispense Refill  . atorvastatin (LIPITOR) 20 MG tablet TAKE 1 TABLET DAILY AT 6 P.M. 90 tablet 2  . calcium-vitamin D (OSCAL WITH D) 500-200 MG-UNIT per tablet Take 1 tablet by mouth daily.     . cholecalciferol (VITAMIN D) 1000 UNITS tablet Take 1,000 Units by mouth daily.    . diclofenac (FLECTOR) 1.3 % PTCH Place 1 patch onto the skin every 12 (twelve) hours. Apply as directed by Dr. Brien Few    . ELIQUIS 2.5 MG TABS tablet TAKE 1 TABLET TWICE A DAY 180 tablet 2  .  esomeprazole (NEXIUM) 40 MG capsule TAKE 1 CAPSULE DAILY 90 capsule 2  . Ferrous Sulfate Dried (FEOSOL) 200 (65 FE) MG TABS Take 1 tablet by mouth 2 (two) times daily. Take with a vitamin c 500mg      . fexofenadine (ALLEGRA) 180 MG tablet Take 180 mg by mouth daily as needed for allergies or rhinitis.    [name] isosorbide mononitrate (IMDUR) 30 MG 24 hr tablet TAKE 1 TABLET BY MOUTH EVERY DAY 30 tablet 2  . oxyCODONE-acetaminophen (PERCOCET) 5-325 MG per tablet Take 1 tablet by mouth every 6 (six) hours as needed for moderate pain.     . polyethylene glycol (MIRALAX / GLYCOLAX) packet Take 17 g by mouth 2 (two) times daily. 28 each 0  . pregabalin (LYRICA) 50 MG capsule Take 1 capsule (50 mg total) by mouth 3 (three) times daily. 270 capsule 1  . PROLIA 60 MG/ML SOLN injection Inject 60 mg into the skin every 6 (six) months.     . solifenacin (VESICARE) 10 MG tablet Take 1 tablet (10 mg total) by mouth daily. 90 tablet 1  . vitamin B-12 (CYANOCOBALAMIN) 1000 MCG tablet Take 1 tablet (1,000 mcg total) by mouth daily.     No current facility-administered medications on file prior to visit.    Review of Systems Constitutional: Negative for increased diaphoresis, or other activity, appetite or siginficant weight change other than noted HENT: Negative for worsening hearing loss, ear pain, facial swelling, mouth sores and neck stiffness.   Eyes: Negative for other worsening pain, redness or visual disturbance.  Respiratory: Negative for choking or stridor Cardiovascular: Negative for other chest pain and palpitations.  Gastrointestinal: Negative for worsening diarrhea, blood in stool, or abdominal distention Genitourinary: Negative for hematuria, flank pain or change in urine volume.  Musculoskeletal: Negative for myalgias or other joint complaints.  Skin: Negative for other color change and wound or drainage.  Neurological: Negative for syncope and numbness. other than noted Hematological: Negative for  adenopathy. or other swelling Psychiatric/Behavioral: Negative for hallucinations, SI, self-injury, decreased concentration or other worsening agitation.  All other system neg per pt    Objective:   Physical Exam BP 128/78   Pulse 63   Temp 98.4 F (36.9 C) (Oral)   Resp 20   Wt 123 lb 4 oz (55.9 kg)   SpO2 93%   BMI 24.89 kg/m  VS noted,  Constitutional: Pt is oriented to person, place, and time. Appears well-developed and well-nourished, in no significant distress Head: Normocephalic and atraumatic  Eyes: Conjunctivae and EOM are normal. Pupils are equal, round, and reactive to light Right Ear: External ear normal.  Left Ear: External ear normal Nose: Nose normal.  Mouth/Throat: Oropharynx is clear and moist  Neck: Normal range of motion. Neck supple. No JVD present. No tracheal deviation present or significant neck LA or mass Cardiovascular: Normal rate, regular rhythm, normal heart sounds and  intact distal pulses.   Pulmonary/Chest: Effort normal and breath sounds without rales or wheezing  Abdominal: Soft. Bowel sounds are normal. NT. No HSM  Musculoskeletal: Normal range of motion. Exhibits no edema, right shoulder NT, no bicep tender, swelling or skin change Lymphadenopathy: Has no cervical adenopathy.  Neurological: Pt is alert and oriented to person, place, and time. Pt has normal reflexes. No cranial nerve deficit. Motor grossly intact Skin: Skin is warm and dry. No rash noted or new ulcers Psychiatric:  Has normal mood and affect. Behavior is normal.  No other exam findings    Assessment & Plan:

## 2016-04-01 NOTE — Assessment & Plan Note (Signed)
Mild, asympt, for f/u a1c with labs

## 2016-04-01 NOTE — Progress Notes (Signed)
Pre visit review using our clinic review tool, if applicable. No additional management support is needed unless otherwise documented below in the visit note. 

## 2016-04-01 NOTE — Assessment & Plan Note (Signed)
Exam benign, ? Muscle related, for tylenol prn, consider f/u with Dr Smith/sport med in this office

## 2016-04-01 NOTE — Assessment & Plan Note (Signed)
stable overall by history and exam, recent data reviewed with pt, and pt to continue medical treatment as before,  to f/u any worsening symptoms or concerns BP Readings from Last 3 Encounters:  04/01/16 128/78  12/22/15 118/76  12/09/15 112/86

## 2016-04-23 NOTE — Progress Notes (Signed)
[name] Cathy [name], [place] [name] [address] Phone: [phone] Subjective:    I'm seeing this patient by the request  of:  Cathy Cower, MD   CC: Right arm pain  [id number]  Cathy [name] is a 83 y.o. female coming in with complaint of right arm pain. States that she has had this for quite some time. An states that there are dull ache and sharp pain that certain movements. States that most the pain seems to be anterior. Denies any swelling, any significant radiation of pain. States that it can also hurt without movement. Does not notice any help with ice and heat at home. Rates the severity pain is 6 out of 10.      Past Medical History:  Diagnosis Date  . Allergic rhinitis   . Anemia   . Anxiety   . Chronic systolic CHF (congestive heart failure) ([place])    a. 2D Echo 02/02/15: EF 25-30%, akinesis of mid-apical anteroseptal myocardium, grade 3 DD, mod MR, severely dilated LA, mildly reduced RV systoluc function, mod dilated RA, mod TR, mildly increased PASP 55mmHg.  . Colitis    lymphocytic colitis feb 2011  . Coronary artery calcification    a. By CT 01/2015.  . Diverticulosis of colon   . DJD (degenerative joint disease)   . Dysphagia   . Hiatal hernia   . Hypercholesteremia   . Hypertension   . Iron deficiency   . Irritable bowel syndrome   . Liver lesion 02/18/2015  . Lumbar back pain   . Osteoporosis   . Paroxysmal atrial flutter ([place])    a. Dx 01/2015 - underwent DCCV 03/2015.  [name] Sinus bradycardia   . Splenic lesion 02/18/2015  . Urinary incontinence   . Venous insufficiency   . Vitamin B12 deficiency    Past Surgical History:  Procedure Laterality Date  . ABDOMINAL HYSTERECTOMY    . CARDIOVERSION N/A 02/03/2015   Procedure: CARDIOVERSION;  Surgeon: Cathy Margarita, MD;  Location: [name] ENDOSCOPY;  Service: Cardiovascular;  Laterality: N/A;  . CARDIOVERSION N/A 03/10/2015   Procedure: CARDIOVERSION;  Surgeon: Cathy Spark,  MD;  Location: [facility] ENDOSCOPY;  Service: Cardiovascular;  Laterality: N/A;  . cataract surgery    . decompressive lumbar laminectomy  06/2008   at L2-3, L3-4 and L4-5 by Dr. Ronnald [name]  . LAPAROSCOPIC CHOLECYSTECTOMY  04/2009   Cathy [name]  . REPLACEMENT TOTAL KNEE    . TEE WITHOUT CARDIOVERSION N/A 02/03/2015   Procedure: TRANSESOPHAGEAL ECHOCARDIOGRAM (TEE);  Surgeon: Cathy Margarita, MD;  Location: [facility] ENDOSCOPY;  Service: Cardiovascular;  Laterality: N/A;   Social History   Social History  . Marital status: Widowed    Spouse name: N/A  . Number of children: N/A  . Years of education: N/A   Occupational History  . retired    Social History Main Topics  . Smoking status: Former Smoker    Packs/day: 0.50    Years: 10.00    Types: Cigarettes  . Smokeless tobacco: Never Used  . Alcohol use No  . Drug use: No  . Sexual activity: No   Other Topics Concern  . None   Social History Narrative  . None   Allergies  Allergen Reactions  . Amoxicillin-Pot Clavulanate     REACTION: diarrhea  . Sulfonamide Derivatives Other (See Comments)    unknown   Family History  Problem Relation Age of Onset  . Heart attack Mother   . Heart attack  Brother   . Diabetes Brother   . Hypertension Neg Hx   . Fainting Neg Hx   . Stroke Neg Hx     Past medical history, social, surgical and family history all reviewed in electronic medical record.  No pertanent information unless stated regarding to the chief complaint.   Review of Systems: No headache, visual changes, nausea, vomiting, diarrhea, constipation, dizziness, abdominal pain, skin rash, fevers, chills, night sweats, weight loss, swollen lymph nodes, chest pain, shortness of breath, mood changes.  +muscle aches, joint pain.     Objective  Blood pressure 116/74, pulse 98, height 4\' 11"  (1.499 m), weight 126 lb (57.2 kg), SpO2 92 %. Systems examined below as of 04/26/16   General: No apparent distress alert and oriented x3 mood and  affect normal, dressed appropriately.  HEENT: Pupils equal, extraocular movements intact  Respiratory: Patient's speak in full sentences and does not appear short of breath  Cardiovascular: No lower extremity edema, non tender, no erythema  Skin: Warm dry intact with no signs of infection or rash on extremities or on axial skeleton.  Abdomen: Soft nontender  Neuro: Cranial nerves II through XII are intact, neurovascularly intact in all extremities with 2+ DTRs and 2+ pulses.  Lymph: No lymphadenopathy of posterior or anterior cervical chain or axillae bilaterally.  Gait antalgic gait. MSK:  Mild tender with full range of motion and good stability and symmetric strength and tone of  elbows, wrist, hip, knee and ankles bilaterally. Severe arthritic changes.  Shoulder: Right Atrophy noted.  Diffuse tenderness noted Crepitus with range of motion Rotator cuff strength 3/5 signs of impingement with positive Neer and Hawkin's tests, but negative empty can sign. Speeds and Yergason's tests positive  + labral  Normal scapular function observed. No painful arc and no drop arm sign. No apprehension sign Contralateral shoulder does have pain but does have full range of motion and no weakness  MSK US performed of: Right This study was ordered, performed, and interpreted by Cathy [name] D.O.  Shoulder:   Supraspinatus: severe tear with retraction noted.  Infraspinatus:  Appears normal on long and transverse views. Significant increase in Doppler flow Subscapularis:  Severe arthritis.  AC joint:  Severe OA.  Glenohumeral Joint:  Appears normal without effusion. Glenoid Labrum:  Intact without visualized tears. Biceps Tendon:  Complete tear. .  Impression: rotator cuff tear.   Procedure: Real-time Ultrasound Guided Injection of right glenohumeral joint Device: GE Logiq E  Ultrasound guided injection is preferred based studies that show increased duration, increased effect, greater accuracy,  decreased procedural pain, increased response rate with ultrasound guided versus blind injection.  Verbal informed consent obtained.  Time-out conducted.  Noted no overlying erythema, induration, or other signs of local infection.  Skin prepped in a sterile fashion.  Local anesthesia: Topical Ethyl chloride.  With sterile technique and under real time ultrasound guidance:  Joint visualized.  23g 1  inch needle inserted anteriorapproach. Pictures taken for needle placement. Patient did have injection of 2 cc of 1% lidocaine, 2 cc of 0.5% Marcaine, and 1.0 cc of Kenalog 40 mg/dL. Completed without difficulty  Pain immediately resolved suggesting accurate placement of the medication.  Advised to call if fevers/chills, erythema, induration, drainage, or persistent bleeding.  Images permanently stored and available for review in the ultrasound unit.  Impression: Technically successful ultrasound guided injection.    Impression and Recommendations:     This case required medical decision making of moderate complexity.  Note: This dictation was prepared with Dragon dictation along with smaller phrase technology. Any transcriptional errors that result from this process are unintentional.

## 2016-04-26 ENCOUNTER — Ambulatory Visit (INDEPENDENT_AMBULATORY_CARE_PROVIDER_SITE_OTHER): Payer: Medicare Other | Admitting: Family Medicine

## 2016-04-26 ENCOUNTER — Ambulatory Visit: Payer: Self-pay

## 2016-04-26 ENCOUNTER — Encounter: Payer: Self-pay | Admitting: Family Medicine

## 2016-04-26 VITALS — BP 116/74 | HR 98 | Ht 59.0 in | Wt 126.0 lb

## 2016-04-26 DIAGNOSIS — M12811 Other specific arthropathies, not elsewhere classified, right shoulder: Secondary | ICD-10-CM | POA: Diagnosis not present

## 2016-04-26 DIAGNOSIS — M25511 Pain in right shoulder: Secondary | ICD-10-CM | POA: Diagnosis not present

## 2016-04-26 NOTE — Patient Instructions (Addendum)
Good to see you.  You did tear yor rotator cuff, I think it happen when you fell in September.  Ice 20 minutes 2 times daily. Usually after activity and before bed.  pennsaid pinkie amount topically 2 times daily as needed.  Vitamin D 2000 IU daily  Tart cherry extract any dose at night This will all help the pain do not know how much strength you will get  See me again in 4 weeks. Can repeat injection every 10 weeks.

## 2016-04-26 NOTE — Assessment & Plan Note (Signed)
Injected today. Based on patient's presentation and do think that there was a traumatic rotator cuff tear. Patient though does have significant underlying arthritis. Because of this I do not think that she is a surgical candidate. Patient was given injection today for further help with pain. We discussed icing regimen. Patient will try to increase activity as tolerated. Can repeat injections every 10-12 weeks if necessary. Topical anti-inflammatories given. Follow-up again in 4-6 weeks.

## 2016-05-27 ENCOUNTER — Telehealth: Payer: Self-pay | Admitting: Family Medicine

## 2016-05-27 ENCOUNTER — Ambulatory Visit (INDEPENDENT_AMBULATORY_CARE_PROVIDER_SITE_OTHER): Payer: Medicare Other | Admitting: Family Medicine

## 2016-05-27 ENCOUNTER — Encounter: Payer: Self-pay | Admitting: Family Medicine

## 2016-05-27 DIAGNOSIS — M12811 Other specific arthropathies, not elsewhere classified, right shoulder: Secondary | ICD-10-CM

## 2016-05-27 MED ORDER — VITAMIN D (ERGOCALCIFEROL) 1.25 MG (50000 UNIT) PO CAPS: 50000.0000 [IU] | ORAL_CAPSULE | ORAL | 0 refills | Status: DC

## 2016-05-27 NOTE — Patient Instructions (Addendum)
Good to see you  Sorry you are hurting.  Ice is your friend.  Once weekly vitamin D for 12 weeks.  Tart cherry extract at night Turmeric 500mg  daily watch for stomach pain or bruising and then stop See me again in 6 weeks and we can inject again.

## 2016-05-27 NOTE — Telephone Encounter (Signed)
Called patient and left message to pick up meds.

## 2016-05-27 NOTE — Progress Notes (Signed)
Cathy [name], [place] [address] Phone: [phone] Subjective:    I'm seeing this patient by the request  of:  [name], MD   CC: Right arm pain f/u   [id number]  Cathy [name] is a 83 y.o. female coming in with complaint of right arm pain. Patient was seen previously and did have a large rotator cuff tear as well as a bicep tendon tear. Patient was given an injection. Felt like she felt better for 4 days and the pain is coming back. States that the pain is significantly increased if anything. Patient has difficulty doing any activities. Patient states that sleeping is difficulty as well as dressing.     Past Medical History:  Diagnosis Date  . Allergic rhinitis   . Anemia   . Anxiety   . Chronic systolic CHF (congestive heart failure) ([place])    a. 2D Echo 02/02/15: EF 25-30%, akinesis of mid-apical anteroseptal myocardium, grade 3 DD, mod MR, severely dilated LA, mildly reduced RV systoluc function, mod dilated RA, mod TR, mildly increased PASP 37mmHg.  . Colitis    lymphocytic colitis feb 2011  . Coronary artery calcification    a. By CT 01/2015.  . Diverticulosis of colon   . DJD (degenerative joint disease)   . Dysphagia   . Hiatal hernia   . Hypercholesteremia   . Hypertension   . Iron deficiency   . Irritable bowel syndrome   . Liver lesion 02/18/2015  . Lumbar back pain   . Osteoporosis   . Paroxysmal atrial flutter ([place])    a. Dx 01/2015 - underwent DCCV 03/2015.  [name] Sinus bradycardia   . Splenic lesion 02/18/2015  . Urinary incontinence   . Venous insufficiency   . Vitamin B12 deficiency    Past Surgical History:  Procedure Laterality Date  . ABDOMINAL HYSTERECTOMY    . CARDIOVERSION N/A 02/03/2015   Procedure: CARDIOVERSION;  Surgeon: Sueanne Margarita, MD;  Location: [facility] ENDOSCOPY;  Service: Cardiovascular;  Laterality: N/A;  . CARDIOVERSION N/A 03/10/2015   Procedure: CARDIOVERSION;  Surgeon: Dorothy Spark, MD;  Location: [facility] ENDOSCOPY;  Service: Cardiovascular;  Laterality: N/A;  . cataract surgery    . decompressive lumbar laminectomy  06/2008   at L2-3, L3-4 and L4-5 by Dr. Ronnald Ramp  . LAPAROSCOPIC CHOLECYSTECTOMY  04/2009   Dr. Abran Cantor  . REPLACEMENT TOTAL KNEE    . TEE WITHOUT CARDIOVERSION N/A 02/03/2015   Procedure: TRANSESOPHAGEAL ECHOCARDIOGRAM (TEE);  Surgeon: Sueanne Margarita, MD;  Location: [facility] ENDOSCOPY;  Service: Cardiovascular;  Laterality: N/A;   Social History   Social History  . Marital status: Widowed    Spouse name: N/A  . Number of children: N/A  . Years of education: N/A   Occupational History  . retired    Social History Main Topics  . Smoking status: Former Smoker    Packs/day: 0.50    Years: 10.00    Types: Cigarettes  . Smokeless tobacco: Never Used  . Alcohol use No  . Drug use: No  . Sexual activity: No   Other Topics Concern  . None   Social History Narrative  . None   Allergies  Allergen Reactions  . Amoxicillin-Pot Clavulanate     REACTION: diarrhea  . Sulfonamide Derivatives Other (See Comments)    unknown   Family History  Problem Relation Age of Onset  . Heart attack Mother   . Heart attack Brother   .  Diabetes Brother   . Hypertension Neg Hx   . Fainting Neg Hx   . Stroke Neg Hx     Past medical history, social, surgical and family history all reviewed in electronic medical record.  No pertanent information unless stated regarding to the chief complaint.   Review of Systems: No  visual changes, nausea, vomiting, diarrhea, constipation, dizziness, abdominal pain, skin rash, fevers, chills, night sweats, weight loss, swollen lymph nodes, chest pain, shortness of breath, mood changes.  Review of systems positive for headaches, muscle aches, body aches, joint swelling   Objective  Blood pressure 110/74, pulse (!) 112, height 4\' 11"  (1.499 m), weight 123 lb 6.4 oz (56 kg), SpO2 96 %. Systems examined below as of 05/27/16     Systems examined below as of 05/27/16 General: NAD A&O x3 mood, affect normal  HEENT: Pupils equal, extraocular movements intact no nystagmus Respiratory: not short of breath at rest or with speaking Cardiovascular: No lower extremity edema, non tender Skin: Warm dry intact with no signs of infection or rash on extremities or on axial skeleton. Abdomen: Soft nontender, no masses Neuro: Cranial nerves  intact, neurovascularly intact in all extremities with 2+ DTRs and 2+ pulses. Lymph: No lymphadenopathy appreciated today  Gait Ambulates with the aid of a cane. Severe kyphosis noted MSK: tender with full range of motion and good stability and symmetric strength and tone of elbows, wrist,  knee hips and ankles bilaterally. Severe arthritic changes of multiple joints   Shoulder: Right Atrophy noted.  Diffuse tenderness noted mild swelling noted over the posterior capsule Crepitus with range of motion Rotator cuff strength 3/5 possibly worsen previous exam signs of impingement with positive Neer and Hawkin's tests,  empty can sign. Speeds and Yergason's tests positive  + labral  Painful arc in positive drop arm sign No apprehension sign Contralateral shoulder does have pain but does have full range of motion and no weakness     Impression and Recommendations:     This case required medical decision making of moderate complexity.      Note: This dictation was prepared with Dragon dictation along with smaller phrase technology. Any transcriptional errors that result from this process are unintentional.

## 2016-05-27 NOTE — Assessment & Plan Note (Signed)
Discussed with patient as well as daughter great length. Patient's chronic pain medications, comorbidities, and the severity of the rotator cuff tear likely not going to heal on its own. Patient wants to avoid any type of surgical intervention. Discussed with her the only thing that can be potentially different experimental such as viscous supplementation in PRP injections for the shoulder. We would need prior authorization. Patient would consider it. Patient otherwise can have repeat injections every 10 weeks. Patient will consider this. Will follow-up in 6 weeks

## 2016-05-27 NOTE — Telephone Encounter (Signed)
Patient states she seen Dr. Tamala Julian today.  She states he was going to give her samples of a medication for her arm.  She states she never got this.  She is requesting to send her daughter back up to pick this up.  Please follow up in regard.

## 2016-06-09 ENCOUNTER — Telehealth: Payer: Self-pay

## 2016-06-09 NOTE — Telephone Encounter (Signed)
**Note De-Identified  Obfuscation** PA (additional questions) for Eliquis has been faxed back to [facility]. Awaiting response.

## 2016-06-16 NOTE — Telephone Encounter (Signed)
Patients ELIQUIS approved dates 05/14/16-06/13/17, [facility]

## 2016-06-21 ENCOUNTER — Other Ambulatory Visit: Payer: Self-pay | Admitting: Cardiology

## 2016-07-08 ENCOUNTER — Ambulatory Visit: Payer: Medicare Other | Admitting: Internal Medicine

## 2016-07-13 ENCOUNTER — Other Ambulatory Visit: Payer: Self-pay

## 2016-07-13 MED ORDER — PREGABALIN 50 MG PO CAPS: 50.0000 mg | ORAL_CAPSULE | Freq: Three times a day (TID) | ORAL | 1 refills | Status: DC

## 2016-07-13 NOTE — Telephone Encounter (Signed)
faxed

## 2016-07-13 NOTE — Telephone Encounter (Signed)
Done hardcopy to [place]  

## 2016-07-19 ENCOUNTER — Other Ambulatory Visit: Payer: Self-pay | Admitting: Cardiology

## 2016-08-25 ENCOUNTER — Other Ambulatory Visit: Payer: Self-pay | Admitting: Cardiology

## 2016-08-29 ENCOUNTER — Other Ambulatory Visit: Payer: Self-pay | Admitting: Cardiology

## 2016-09-21 ENCOUNTER — Ambulatory Visit (INDEPENDENT_AMBULATORY_CARE_PROVIDER_SITE_OTHER): Payer: Medicare Other | Admitting: Cardiology

## 2016-09-21 VITALS — BP 124/74 | HR 117 | Ht <= 58 in | Wt 118.1 lb

## 2016-09-21 DIAGNOSIS — I483 Typical atrial flutter: Secondary | ICD-10-CM | POA: Diagnosis not present

## 2016-09-21 DIAGNOSIS — I10 Essential (primary) hypertension: Secondary | ICD-10-CM | POA: Diagnosis not present

## 2016-09-21 DIAGNOSIS — I428 Other cardiomyopathies: Secondary | ICD-10-CM | POA: Diagnosis not present

## 2016-09-21 NOTE — Progress Notes (Signed)
Electrophysiology Office Note   Date:  09/21/2016   ID:  Cathy [name], DOB [birth year], MRN [id number]  PCP:  Biagio Borg, MD  Cardiologist:  Pernell Dupre Primary Electrophysiologist:  Constance Haw, MD    No chief complaint on file.    History of Present Illness: Cathy [name] is a 83 y.o. female who presents today for electrophysiology evaluation.   She has a history of HTN, venous insufficiency, hypercholesterolemia, anemia, and recently diagnosed atrial flutter, combined CHF/LV dysfunction (EF 25-30%), and coronary artery calcification.  To recap recent history, she was admitted 01/2015 from urgent care after following up there for a UTI and being found to have atrial flutter 2:1 conduction. CTA 02/01/15 showed no PE but did show atherosclerosis of coronary arteries. TEE/DCCV was planned but DCCV was not performed due to inadequate visualization of the LA thrombus. She was started on amiodarone and Eliquis. 2D Echo 02/02/15: EF 25-30%, akinesis of mid-apical anteroseptal myocardium, grade 3 DD, severely dilated LA, mildly reduced RV systolic function, LV dysfunction was felt tachy-related. The patient was seen back in follow-up 02/25/15 and outpatient DCCV was arranged. Metoprolol was decreased due to HR 50's and amiodarone was decreased to once daily. She underwent DCCV 03/10/15. Of note she was also evaluated in the interim by oncology due to evaluation of liver/splenic lesions concerning for lymphoma. She had a PET scan performed recently that showed no hypermetabolic activity in the liver or the spleen and these lesions were questionable for vascular phenomena or multiple splenic hemangiomas versus lymphangiomas or cysts. It was recommended she f/u with PCP.   Today, denies symptoms of palpitations, chest pain, orthopnea, PND, lower extremity edema, claudication, dizziness, presyncope, syncope, bleeding, or neurologic sequela. The patient is tolerating medications without difficulties.  She does complain of some shortness of breath and fatigue. This is been going on for a few weeks. She is still able to do all of her daily activities. She is planned for back injections on August 10.  Past Medical History:  Diagnosis Date  . Allergic rhinitis   . Anemia   . Anxiety   . Chronic systolic CHF (congestive heart failure) ([place])    a. 2D Echo 02/02/15: EF 25-30%, akinesis of mid-apical anteroseptal myocardium, grade 3 DD, mod MR, severely dilated LA, mildly reduced RV systoluc function, mod dilated RA, mod TR, mildly increased PASP 17mmHg.  . Colitis    lymphocytic colitis feb 2011  . Coronary artery calcification    a. By CT 01/2015.  . Diverticulosis of colon   . DJD (degenerative joint disease)   . Dysphagia   . Hiatal hernia   . Hypercholesteremia   . Hypertension   . Iron deficiency   . Irritable bowel syndrome   . Liver lesion 02/18/2015  . Lumbar back pain   . Osteoporosis   . Paroxysmal atrial flutter ([place])    a. Dx 01/2015 - underwent DCCV 03/2015.  [name] Sinus bradycardia   . Splenic lesion 02/18/2015  . Urinary incontinence   . Venous insufficiency   . Vitamin B12 deficiency    Past Surgical History:  Procedure Laterality Date  . ABDOMINAL HYSTERECTOMY    . CARDIOVERSION N/A 02/03/2015   Procedure: CARDIOVERSION;  Surgeon: Sueanne Margarita, MD;  Location: [facility] ENDOSCOPY;  Service: Cardiovascular;  Laterality: N/A;  . CARDIOVERSION N/A 03/10/2015   Procedure: CARDIOVERSION;  Surgeon: Dorothy Spark, MD;  Location: [facility] ENDOSCOPY;  Service: Cardiovascular;  Laterality: N/A;  . cataract surgery    .  decompressive lumbar laminectomy  06/2008   at L2-3, L3-4 and L4-5 by Dr. Ronnald Ramp  . LAPAROSCOPIC CHOLECYSTECTOMY  04/2009   Dr. Abran Cantor  . REPLACEMENT TOTAL KNEE    . TEE WITHOUT CARDIOVERSION N/A 02/03/2015   Procedure: TRANSESOPHAGEAL ECHOCARDIOGRAM (TEE);  Surgeon: Sueanne Margarita, MD;  Location: [facility] ENDOSCOPY;  Service: Cardiovascular;  Laterality: N/A;     Current  Outpatient Prescriptions  Medication Sig Dispense Refill  . atorvastatin (LIPITOR) 20 MG tablet TAKE 1 TABLET DAILY AT 6 P.M. 90 tablet 2  . calcium-vitamin D (OSCAL WITH D) 500-200 MG-UNIT per tablet Take 1 tablet by mouth daily.     . cholecalciferol (VITAMIN D) 1000 UNITS tablet Take 1,000 Units by mouth daily.    . diclofenac (FLECTOR) 1.3 % PTCH Place 1 patch onto the skin every 12 (twelve) hours. Apply as directed by Dr. Brien Few    . ELIQUIS 2.5 MG TABS tablet TAKE 1 TABLET TWICE A DAY 180 tablet 1  . esomeprazole (NEXIUM) 40 MG capsule TAKE 1 CAPSULE DAILY 90 capsule 2  . Ferrous Sulfate Dried (FEOSOL) 200 (65 FE) MG TABS Take 1 tablet by mouth 2 (two) times daily. Take with a vitamin c 500mg      . fexofenadine (ALLEGRA) 180 MG tablet Take 180 mg by mouth daily as needed for allergies or rhinitis.    [name] isosorbide mononitrate (IMDUR) 30 MG 24 hr tablet Take 1 tablet (30 mg total) by mouth daily. Last attempt please call [phone] to schedule appointment for additional refills thanks. 15 tablet 0  . oxyCODONE-acetaminophen (PERCOCET) 5-325 MG per tablet Take 1 tablet by mouth every 6 (six) hours as needed for moderate pain.     . polyethylene glycol (MIRALAX / GLYCOLAX) packet Take 17 g by mouth 2 (two) times daily. 28 each 0  . pregabalin (LYRICA) 50 MG capsule Take 1 capsule (50 mg total) by mouth 3 (three) times daily. 270 capsule 1  . PROLIA 60 MG/ML SOLN injection Inject 60 mg into the skin every 6 (six) months.     . solifenacin (VESICARE) 10 MG tablet Take 1 tablet (10 mg total) by mouth daily. 90 tablet 1  . vitamin B-12 (CYANOCOBALAMIN) 1000 MCG tablet Take 1 tablet (1,000 mcg total) by mouth daily.    . Vitamin D, Ergocalciferol, (DRISDOL) 50000 units CAPS capsule Take 1 capsule (50,000 Units total) by mouth every 7 (seven) days. 12 capsule 0   No current facility-administered medications for this visit.     Allergies:   Amoxicillin-pot clavulanate and Sulfonamide derivatives     Social History:  The patient  reports that she has quit smoking. Her smoking use included Cigarettes. She has a 5.00 pack-year smoking history. She has never used smokeless tobacco. She reports that she does not drink alcohol or use drugs.   Family History:  The patient's family history includes Diabetes in her brother; Heart attack in her brother and mother.    ROS:  Please see the history of present illness.   Otherwise, review of systems is positive for leg pain, leg swelling, DOE, balance problems, back pain.   All other systems are reviewed and negative.   PHYSICAL EXAM: VS:  BP 124/74   Pulse (!) 117   Ht 4\' 9"  (1.448 m)   Wt 118 lb 2 oz (53.6 kg)   SpO2 97%   BMI 25.56 kg/m  , BMI Body mass index is 25.56 kg/m. GEN: Well nourished, well developed, in no acute distress  HEENT:  normal  Neck: no JVD, carotid bruits, or masses Cardiac: tachycardic, regular; no murmurs, rubs, or gallops,no edema  Respiratory:  clear to auscultation bilaterally, normal work of breathing GI: soft, nontender, nondistended, + BS MS: no deformity or atrophy  Skin: warm and dry Neuro:  Strength and sensation are intact Psych: euthymic mood, full affect  EKG:  EKG is ordered today. Personal review of the ekg ordered shows atrial flutter, RBBB, rate 117    Recent Labs: 04/01/2016: ALT 66; BUN 13; Creatinine, Ser 0.74; Hemoglobin 13.1; Platelets 173.0; Potassium 4.2; Sodium 142; TSH 2.46    Lipid Panel     Component Value Date/Time   CHOL 132 04/01/2016 1402   TRIG 57.0 04/01/2016 1402   HDL 64.80 04/01/2016 1402   CHOLHDL 2 04/01/2016 1402   VLDL 11.4 04/01/2016 1402   LDLCALC 56 04/01/2016 1402   LDLDIRECT 158.1 05/29/2008 1137     Wt Readings from Last 3 Encounters:  09/21/16 118 lb 2 oz (53.6 kg)  05/27/16 123 lb 6.4 oz (56 kg)  04/26/16 126 lb (57.2 kg)      Other studies Reviewed: Additional studies/ records that were reviewed today include: TTE 02/02/15 Review of the above  records today demonstrates:  - Left ventricle: The cavity size was normal. There was moderate concentric hypertrophy. Systolic function was severely reduced. The estimated ejection fraction was in the range of 25% to 30%. There is akinesis of the mid-apicalanteroseptal myocardium. Doppler parameters are consistent with a reversible restrictive pattern, indicative of decreased left ventricular diastolic compliance and/or increased left atrial pressure (grade 3 diastolic dysfunction). - Mitral valve: Moderately calcified annulus. There was moderate regurgitation. - Left atrium: The atrium was severely dilated. Volume/bsa, ES (1-plane Simpson&'s, A4C): 64.3 ml/m^2. - Right ventricle: Systolic function was mildly reduced. - Right atrium: The atrium was moderately dilated. - Tricuspid valve: There was moderate regurgitation. - Pulmonary arteries: Systolic pressure was mildly increased. PA peak pressure: 33 mm Hg (S).   ASSESSMENT AND PLAN:  1. Atrial flutter: Has had a cardioversion in the past but is unfortunately back in flutter. She is symptomatic with shortness of breath and fatigue. Due to her symptoms, she would likely benefit from a return to sinus rhythm. She is planning to get a spinal injection on August 10. I will see her back in one month for amiodarone loading. If she does not convert back to sinus rhythm, will plan for cardioversion.  This patients CHA2DS2-VASc Score and unadjusted Ischemic Stroke Rate (% per year) is equal to 4.8 % stroke rate/year from a score of 4  Above score calculated as 1 point each if present [CHF, HTN, DM, Vascular=MI/PAD/Aortic Plaque, Age if 65-74, or Female] Above score calculated as 2 points each if present [Age > 75, or Stroke/TIA/TE]   2. Chronic combined CHF - no signs of volume overload at this time. Continue with current management.  3. Essential HTN - blood pressure well controlled. No changes at this  time.    Current medicines are reviewed at length with the patient today.   The patient does not have concerns regarding her medicines.  The following changes were made today:  none  Labs/ tests ordered today include:  Orders Placed This Encounter  Procedures  . EKG 12-Lead     Disposition:   FU with Will Camnitz 1  months  Signed, Will Meredith Leeds, MD  09/21/2016 4:31 PM     [address] [place] [address] [phone] (  office) [phone] (fax)

## 2016-09-21 NOTE — Patient Instructions (Addendum)
Medication Instructions:    Your physician recommends that you continue on your current medications as directed. Please refer to the Current Medication list given to you today.  - If you need a refill on your cardiac medications before your next appointment, please call your pharmacy.   Labwork:  None ordered  Testing/Procedures:  None ordered  Follow-Up:  Your physician recommends that you schedule a follow-up appointment in: 1 month with Dr. Curt Bears.  Thank you for choosing [facility]!!   Trinidad Curet, RN [phone]  Any Other Special Instructions Will Be Listed Below (If Applicable).

## 2016-09-22 ENCOUNTER — Other Ambulatory Visit: Payer: Self-pay | Admitting: Cardiology

## 2016-09-22 ENCOUNTER — Telehealth: Payer: Self-pay | Admitting: *Deleted

## 2016-09-22 NOTE — Telephone Encounter (Signed)
09/21/16 5:02pm Faxed clearance to [facility] for [facility] scheduled for 10/06/16. States ok to hold Eliquis for 3 days prior to procedure.

## 2016-09-30 ENCOUNTER — Ambulatory Visit: Payer: Medicare Other | Admitting: Internal Medicine

## 2016-09-30 ENCOUNTER — Ambulatory Visit (INDEPENDENT_AMBULATORY_CARE_PROVIDER_SITE_OTHER): Payer: Medicare Other | Admitting: Internal Medicine

## 2016-09-30 ENCOUNTER — Encounter: Payer: Self-pay | Admitting: Internal Medicine

## 2016-09-30 VITALS — BP 114/78 | HR 82 | Ht <= 58 in | Wt 124.0 lb

## 2016-09-30 DIAGNOSIS — Z0000 Encounter for general adult medical examination without abnormal findings: Secondary | ICD-10-CM | POA: Diagnosis not present

## 2016-09-30 DIAGNOSIS — L989 Disorder of the skin and subcutaneous tissue, unspecified: Secondary | ICD-10-CM | POA: Diagnosis not present

## 2016-09-30 DIAGNOSIS — J069 Acute upper respiratory infection, unspecified: Secondary | ICD-10-CM

## 2016-09-30 DIAGNOSIS — M67432 Ganglion, left wrist: Secondary | ICD-10-CM

## 2016-09-30 DIAGNOSIS — L299 Pruritus, unspecified: Secondary | ICD-10-CM | POA: Diagnosis not present

## 2016-09-30 MED ORDER — AZITHROMYCIN 250 MG PO TABS: ORAL_TABLET | ORAL | 1 refills | Status: DC

## 2016-09-30 MED ORDER — BENZONATATE 100 MG PO CAPS: 100.0000 mg | ORAL_CAPSULE | Freq: Two times a day (BID) | ORAL | 0 refills | Status: DC | PRN

## 2016-09-30 NOTE — Progress Notes (Signed)
Subjective:    Patient ID: Cathy [name], female    DOB: 03/23/1927, 83 y.o.   MRN: [id number]  HPI   Here with 2-3 days acute onset fever, facial pain, pressure, headache, general weakness and malaise, and greenish d/c, with mild ST and cough, but pt denies chest pain, wheezing, increased sob or doe, orthopnea, PND, increased LE swelling, palpitations, dizziness or syncope. Scheduled for cardioversion in September. Does also have slight raised sort of hard skin lesion worsening in size last 2 months.  Also mentions asympt swelling area to left volar wrist, some larger but does not hurt, has been "drained" in the past years ago.  Also has left arm itching without rash or other skin change, and denies neck or arm radicular pain Past Medical History:  Diagnosis Date  . Allergic rhinitis   . Anemia   . Anxiety   . Chronic systolic CHF (congestive heart failure) ([place])    a. 2D Echo 02/02/15: EF 25-30%, akinesis of mid-apical anteroseptal myocardium, grade 3 DD, mod MR, severely dilated LA, mildly reduced RV systoluc function, mod dilated RA, mod TR, mildly increased PASP 65mmHg.  . Colitis    lymphocytic colitis feb 2011  . Coronary artery calcification    a. By CT 01/2015.  . Diverticulosis of colon   . DJD (degenerative joint disease)   . Dysphagia   . Hiatal hernia   . Hypercholesteremia   . Hypertension   . Iron deficiency   . Irritable bowel syndrome   . Liver lesion 02/18/2015  . Lumbar back pain   . Osteoporosis   . Paroxysmal atrial flutter ([place])    a. Dx 01/2015 - underwent DCCV 03/2015.  [name] Sinus bradycardia   . Splenic lesion 02/18/2015  . Urinary incontinence   . Venous insufficiency   . Vitamin B12 deficiency    Past Surgical History:  Procedure Laterality Date  . ABDOMINAL HYSTERECTOMY    . CARDIOVERSION N/A 02/03/2015   Procedure: CARDIOVERSION;  Surgeon: Sueanne Margarita, MD;  Location: [facility] ENDOSCOPY;  Service: Cardiovascular;  Laterality: N/A;  . CARDIOVERSION N/A  03/10/2015   Procedure: CARDIOVERSION;  Surgeon: Dorothy Spark, MD;  Location: [facility] ENDOSCOPY;  Service: Cardiovascular;  Laterality: N/A;  . cataract surgery    . decompressive lumbar laminectomy  06/2008   at L2-3, L3-4 and L4-5 by Dr. Ronnald Ramp  . LAPAROSCOPIC CHOLECYSTECTOMY  04/2009   Dr. Abran Cantor  . REPLACEMENT TOTAL KNEE    . TEE WITHOUT CARDIOVERSION N/A 02/03/2015   Procedure: TRANSESOPHAGEAL ECHOCARDIOGRAM (TEE);  Surgeon: Sueanne Margarita, MD;  Location: [facility] ENDOSCOPY;  Service: Cardiovascular;  Laterality: N/A;    reports that she has quit smoking. Her smoking use included Cigarettes. She has a 5.00 pack-year smoking history. She has never used smokeless tobacco. She reports that she does not drink alcohol or use drugs. family history includes Diabetes in her brother; Heart attack in her brother and mother. Allergies  Allergen Reactions  . Amoxicillin-Pot Clavulanate     REACTION: diarrhea  . Sulfonamide Derivatives Other (See Comments)    unknown   Current Outpatient Prescriptions on File Prior to Visit  Medication Sig Dispense Refill  . calcium-vitamin D (OSCAL WITH D) 500-200 MG-UNIT per tablet Take 1 tablet by mouth daily.     . cholecalciferol (VITAMIN D) 1000 UNITS tablet Take 1,000 Units by mouth daily.    . diclofenac (FLECTOR) 1.3 % PTCH Place 1 patch onto the skin every 12 (twelve) hours. Apply as directed by  Dr. Brien Few    . ELIQUIS 2.5 MG TABS tablet TAKE 1 TABLET TWICE A DAY 180 tablet 1  . esomeprazole (NEXIUM) 40 MG capsule TAKE 1 CAPSULE DAILY 90 capsule 2  . Ferrous Sulfate Dried (FEOSOL) 200 (65 FE) MG TABS Take 1 tablet by mouth 2 (two) times daily. Take with a vitamin c 500mg      . fexofenadine (ALLEGRA) 180 MG tablet Take 180 mg by mouth daily as needed for allergies or rhinitis.    [name] isosorbide mononitrate (IMDUR) 30 MG 24 hr tablet Take 1 tablet (30 mg total) by mouth daily. Last attempt please call [phone] to schedule appointment for additional refills  thanks. 15 tablet 0  . isosorbide mononitrate (IMDUR) 30 MG 24 hr tablet Take 1 tablet (30 mg total) by mouth daily. 90 tablet 3  . oxyCODONE-acetaminophen (PERCOCET) 5-325 MG per tablet Take 1 tablet by mouth every 6 (six) hours as needed for moderate pain.     . polyethylene glycol (MIRALAX / GLYCOLAX) packet Take 17 g by mouth 2 (two) times daily. 28 each 0  . pregabalin (LYRICA) 50 MG capsule Take 1 capsule (50 mg total) by mouth 3 (three) times daily. 270 capsule 1  . PROLIA 60 MG/ML SOLN injection Inject 60 mg into the skin every 6 (six) months.     . solifenacin (VESICARE) 10 MG tablet Take 1 tablet (10 mg total) by mouth daily. 90 tablet 1  . vitamin B-12 (CYANOCOBALAMIN) 1000 MCG tablet Take 1 tablet (1,000 mcg total) by mouth daily.    . Vitamin D, Ergocalciferol, (DRISDOL) 50000 units CAPS capsule Take 1 capsule (50,000 Units total) by mouth every 7 (seven) days. 12 capsule 0   No current facility-administered medications on file prior to visit.      Review of Systems  Constitutional: Negative for other unusual diaphoresis or sweats HENT: Negative for ear discharge or swelling Eyes: Negative for other worsening visual disturbances Respiratory: Negative for stridor or other swelling  Gastrointestinal: Negative for worsening distension or other blood Genitourinary: Negative for retention or other urinary change Musculoskeletal: Negative for other MSK pain or swelling Skin: Negative for color change or other new lesions Neurological: Negative for worsening tremors and other numbness  Psychiatric/Behavioral: Negative for worsening agitation or other fatigue All other system neg per pt    Objective:   Physical Exam BP 114/78   Pulse 82   Ht 4\' 9"  (1.448 m)   Wt 124 lb (56.2 kg)   SpO2 98%   BMI 26.83 kg/m  VS noted, mild ill appaering Constitutional: Pt appears in NAD HENT: Head: NCAT.  Right Ear: External ear normal.  Left Ear: External ear normal.  Eyes: . Pupils are  equal, round, and reactive to light. Conjunctivae and EOM are normal Bilat tm's with mild erythema.  Max sinus areas non tender.  Pharynx with mild erythema, no exudate Nose: without d/c or deformity Neck: Neck supple. Gross normal ROM Cardiovascular: Normal rate and regular rhythm.   Pulmonary/Chest: Effort normal and breath sounds without rales or wheezing.  Left wrist with nontender > 1 cm subq cystic mass, soft Neurological: Pt is alert. At baseline orientation, motor grossly intact Skin: Skin is warm. No rashes, no LE edema, 5 mm hard slight raised off white lesion, NT without erythema or swelling Psychiatric: Pt behavior is normal without agitation         Assessment & Plan:

## 2016-09-30 NOTE — Patient Instructions (Addendum)
Please take all new medication as prescribed - the antibiotic, and cough medication   OK to try the OTC Benadryl cream as needed for the itching  You will be contacted regarding the referral for: Dermatology  Please continue all other medications as before, and refills have been done if requested.  Please have the pharmacy call with any other refills you may need.  Please keep your appointments with your specialists as you may have planned - orthopedic tomorrow  Please return in 6 months, or sooner if needed, with Lab testing done 3-5 days before

## 2016-10-01 ENCOUNTER — Other Ambulatory Visit: Payer: Self-pay | Admitting: Internal Medicine

## 2016-10-03 NOTE — Assessment & Plan Note (Signed)
Mild to mod, for antibx course,  to f/u any worsening symptoms or concerns 

## 2016-10-03 NOTE — Assessment & Plan Note (Signed)
Likely benign, but enlarging, for derm referral

## 2016-10-03 NOTE — Assessment & Plan Note (Signed)
?   Neuritic vs derm related, ok to try otc benadryl cream prn

## 2016-10-03 NOTE — Assessment & Plan Note (Signed)
D/w pt, asympt, likely does not require tx at this time, pt to call if needs hand surgury referral

## 2016-10-08 ENCOUNTER — Encounter: Payer: Self-pay | Admitting: *Deleted

## 2016-10-26 ENCOUNTER — Encounter: Payer: Self-pay | Admitting: Cardiology

## 2016-10-26 ENCOUNTER — Ambulatory Visit (INDEPENDENT_AMBULATORY_CARE_PROVIDER_SITE_OTHER): Payer: Medicare Other | Admitting: Cardiology

## 2016-10-26 VITALS — BP 100/70 | HR 118 | Ht <= 58 in | Wt 126.6 lb

## 2016-10-26 DIAGNOSIS — I10 Essential (primary) hypertension: Secondary | ICD-10-CM | POA: Diagnosis not present

## 2016-10-26 DIAGNOSIS — I483 Typical atrial flutter: Secondary | ICD-10-CM | POA: Diagnosis not present

## 2016-10-26 DIAGNOSIS — I428 Other cardiomyopathies: Secondary | ICD-10-CM

## 2016-10-26 NOTE — Progress Notes (Signed)
Electrophysiology Office Note   Date:  10/26/2016   ID:  Cathy [name], DOB August 16, 1927, MRN [id number]  PCP:  Biagio Borg, MD  Cardiologist:  Pernell Dupre Primary Electrophysiologist:  Constance Haw, MD    Chief Complaint  Patient presents with  . Follow-up    Typical AFlutter/Nonischemic cardiomyopathy  . Shortness of Breath     History of Present Illness: Cathy [name] is a 83 y.o. female who presents today for electrophysiology evaluation.   She has a history of HTN, venous insufficiency, hypercholesterolemia, anemia, and recently diagnosed atrial flutter, combined CHF/LV dysfunction (EF 25-30%), and coronary artery calcification.  To recap recent history, she was admitted 01/2015 from urgent care after following up there for a UTI and being found to have atrial flutter 2:1 conduction. CTA 02/01/15 showed no PE but did show atherosclerosis of coronary arteries. TEE/DCCV was planned but DCCV was not performed due to inadequate visualization of the LA thrombus. She was started on amiodarone and Eliquis. 2D Echo 02/02/15: EF 25-30%, akinesis of mid-apical anteroseptal myocardium, grade 3 DD, severely dilated LA, mildly reduced RV systolic function, LV dysfunction was felt tachy-related. The patient was seen back in follow-up 02/25/15 and outpatient DCCV was arranged. Metoprolol was decreased due to HR 50's and amiodarone was decreased to once daily. She underwent DCCV 03/10/15. Of note she was also evaluated in the interim by oncology due to evaluation of liver/splenic lesions concerning for lymphoma. She had a PET scan performed recently that showed no hypermetabolic activity in the liver or the spleen and these lesions were questionable for vascular phenomena or multiple splenic hemangiomas versus lymphangiomas or cysts. It was recommended she f/u with PCP.   Today, denies symptoms of palpitations, chest pain, orthopnea, PND, claudication, dizziness, presyncope, syncope, bleeding, or  neurologic sequela. The patient is tolerating medications without difficulties and is otherwise without complaint today.  Her main complaint is of shortness of breath. She is doing less activity due to her shortness of breath. She has not been able to clean her house. This is been occurring ever since she went into atrial flutter.  Past Medical History:  Diagnosis Date  . Allergic rhinitis   . Anemia   . Anxiety   . Chronic systolic CHF (congestive heart failure) ([place])    a. 2D Echo 02/02/15: EF 25-30%, akinesis of mid-apical anteroseptal myocardium, grade 3 DD, mod MR, severely dilated LA, mildly reduced RV systoluc function, mod dilated RA, mod TR, mildly increased PASP 74mmHg.  . Colitis    lymphocytic colitis feb 2011  . Coronary artery calcification    a. By CT 01/2015.  . Diverticulosis of colon   . DJD (degenerative joint disease)   . Dysphagia   . Hiatal hernia   . Hypercholesteremia   . Hypertension   . Iron deficiency   . Irritable bowel syndrome   . Liver lesion 02/18/2015  . Lumbar back pain   . Osteoporosis   . Paroxysmal atrial flutter ([place])    a. Dx 01/2015 - underwent DCCV 03/2015.  [name] Sinus bradycardia   . Splenic lesion 02/18/2015  . Urinary incontinence   . Venous insufficiency   . Vitamin B12 deficiency    Past Surgical History:  Procedure Laterality Date  . ABDOMINAL HYSTERECTOMY    . CARDIOVERSION N/A 02/03/2015   Procedure: CARDIOVERSION;  Surgeon: Sueanne Margarita, MD;  Location: [facility] ENDOSCOPY;  Service: Cardiovascular;  Laterality: N/A;  . CARDIOVERSION N/A 03/10/2015   Procedure: CARDIOVERSION;  Surgeon:  Dorothy Spark, MD;  Location: [facility] ENDOSCOPY;  Service: Cardiovascular;  Laterality: N/A;  . cataract surgery    . decompressive lumbar laminectomy  06/2008   at L2-3, L3-4 and L4-5 by Dr. Ronnald Ramp  . LAPAROSCOPIC CHOLECYSTECTOMY  04/2009   Dr. Abran Cantor  . REPLACEMENT TOTAL KNEE    . TEE WITHOUT CARDIOVERSION N/A 02/03/2015   Procedure: TRANSESOPHAGEAL  ECHOCARDIOGRAM (TEE);  Surgeon: Sueanne Margarita, MD;  Location[facility] ENDOSCOPY;  Service: Cardiovascular;  Laterality: N/A;     Current Outpatient Prescriptions  Medication Sig Dispense Refill  . atorvastatin (LIPITOR) 20 MG tablet TAKE 1 TABLET DAILY AT 6 P.M. 90 tablet 2  . benzonatate (TESSALON PERLES) 100 MG capsule Take 1 capsule (100 mg total) by mouth 2 (two) times daily as needed for cough. 60 capsule 0  . calcium-vitamin D (OSCAL WITH D) 500-200 MG-UNIT per tablet Take 1 tablet by mouth daily.     . cholecalciferol (VITAMIN D) 1000 UNITS tablet Take 1,000 Units by mouth daily.    . diclofenac (FLECTOR) 1.3 % PTCH Place 1 patch onto the skin every 12 (twelve) hours. Apply as directed by Dr. Brien Few    . ELIQUIS 2.5 MG TABS tablet TAKE 1 TABLET TWICE A DAY 180 tablet 1  . esomeprazole (NEXIUM) 40 MG capsule TAKE 1 CAPSULE DAILY 90 capsule 2  . Ferrous Sulfate Dried (FEOSOL) 200 (65 FE) MG TABS Take 1 tablet by mouth 2 (two) times daily. Take with a vitamin c 500mg      . fexofenadine (ALLEGRA) 180 MG tablet Take 180 mg by mouth daily as needed for allergies or rhinitis.    [name] isosorbide mononitrate (IMDUR) 30 MG 24 hr tablet Take 1 tablet (30 mg total) by mouth daily. Last attempt please call [phone] to schedule appointment for additional refills thanks. 15 tablet 0  . isosorbide mononitrate (IMDUR) 30 MG 24 hr tablet Take 1 tablet (30 mg total) by mouth daily. 90 tablet 3  . oxyCODONE-acetaminophen (PERCOCET) 5-325 MG per tablet Take 1 tablet by mouth every 6 (six) hours as needed for moderate pain.     . polyethylene glycol (MIRALAX / GLYCOLAX) packet Take 17 g by mouth 2 (two) times daily. 28 each 0  . pregabalin (LYRICA) 50 MG capsule Take 1 capsule (50 mg total) by mouth 3 (three) times daily. 270 capsule 1  . PROLIA 60 MG/ML SOLN injection Inject 60 mg into the skin every 6 (six) months.     . solifenacin (VESICARE) 10 MG tablet Take 1 tablet (10 mg total) by mouth daily. 90 tablet 1   . vitamin B-12 (CYANOCOBALAMIN) 1000 MCG tablet Take 1 tablet (1,000 mcg total) by mouth daily.    . Vitamin D, Ergocalciferol, (DRISDOL) 50000 units CAPS capsule Take 1 capsule (50,000 Units total) by mouth every 7 (seven) days. 12 capsule 0   No current facility-administered medications for this visit.     Allergies:   Amoxicillin-pot clavulanate and Sulfonamide derivatives   Social History:  The patient  reports that she has quit smoking. Her smoking use included Cigarettes. She has a 5.00 pack-year smoking history. She has never used smokeless tobacco. She reports that she does not drink alcohol or use drugs.   Family History:  The patient's family history includes Diabetes in her brother; Heart attack in her brother and mother.    ROS:  Please see the history of present illness.   Otherwise, review of systems is positive for neck swelling, dyspnea on exertion, back  pain, balance problems, easy bruising.   All other systems are reviewed and negative.   PHYSICAL EXAM: VS:  BP 100/70   Pulse (!) 118   Ht 4\' 7"  (1.397 m)   Wt 126 lb 9.6 oz (57.4 kg)   SpO2 95%   BMI 29.42 kg/m  , BMI Body mass index is 29.42 kg/m. GEN: Well nourished, well developed, in no acute distress  HEENT: normal  Neck: no JVD, carotid bruits, or masses Cardiac: Tachycardic, regular; no murmurs, rubs, or gallops,no edema  Respiratory:  clear to auscultation bilaterally, normal work of breathing GI: soft, nontender, nondistended, + BS MS: no deformity or atrophy  Skin: warm and dry Neuro:  Strength and sensation are intact Psych: euthymic mood, full affect  EKG:  EKG is ordered today. Personal review of the ekg ordered shows atrial flutter, rate 118, nonspecific interventricular conduction delay   Recent Labs: 04/01/2016: ALT 66; BUN 13; Creatinine, Ser 0.74; Hemoglobin 13.1; Platelets 173.0; Potassium 4.2; Sodium 142; TSH 2.46    Lipid Panel     Component Value Date/Time   CHOL 132 04/01/2016 1402     TRIG 57.0 04/01/2016 1402   HDL 64.80 04/01/2016 1402   CHOLHDL 2 04/01/2016 1402   VLDL 11.4 04/01/2016 1402   LDLCALC 56 04/01/2016 1402   LDLDIRECT 158.1 05/29/2008 1137     Wt Readings from Last 3 Encounters:  10/26/16 126 lb 9.6 oz (57.4 kg)  09/30/16 124 lb (56.2 kg)  09/21/16 118 lb 2 oz (53.6 kg)      Other studies Reviewed: Additional studies/ records that were reviewed today include: TTE 02/02/15 Review of the above records today demonstrates:  - Left ventricle: The cavity size was normal. There was moderate concentric hypertrophy. Systolic function was severely reduced. The estimated ejection fraction was in the range of 25% to 30%. There is akinesis of the mid-apicalanteroseptal myocardium. Doppler parameters are consistent with a reversible restrictive pattern, indicative of decreased left ventricular diastolic compliance and/or increased left atrial pressure (grade 3 diastolic dysfunction). - Mitral valve: Moderately calcified annulus. There was moderate regurgitation. - Left atrium: The atrium was severely dilated. Volume/bsa, ES (1-plane Simpson&'s, A4C): 64.3 ml/m^2. - Right ventricle: Systolic function was mildly reduced. - Right atrium: The atrium was moderately dilated. - Tricuspid valve: There was moderate regurgitation. - Pulmonary arteries: Systolic pressure was mildly increased. PA peak pressure: 33 mm Hg (S).   ASSESSMENT AND PLAN:  1. Atrial flutter: Been cardioverted in the past but is back in atrial flutter, and appears to have been for at least a month. She has spinal injection on August 17 and restart her anticoagulation on the 18th. We'll wait 3 weeks from the 18th and start amiodarone 400 mg twice a day. One week after that we'll plan for cardioversion.  This patients CHA2DS2-VASc Score and unadjusted Ischemic Stroke Rate (% per year) is equal to 4.8 % stroke rate/year from a score of 4  Above score calculated as 1  point each if present [CHF, HTN, DM, Vascular=MI/PAD/Aortic Plaque, Age if 65-74, or Female] Above score calculated as 2 points each if present [Age > 75, or Stroke/TIA/TE]  2. Chronic combined CHF - ejection fraction has normalized. No signs of volume overload. I am worried that continued tachycardia could cause a decrease in her ejection fraction again. Continue current management. 3. Essential HTN - pressure well controlled. No changes at this time.  Current medicines are reviewed at length with the patient today.   The patient does  not have concerns regarding her medicines.  The following changes were made today:  None  Labs/ tests ordered today include:  Orders Placed This Encounter  Procedures  . EKG 12-Lead     Disposition:   FU with Yosgart Pavey 3  months  Signed, Gerber Penza Meredith Leeds, MD  10/26/2016 2:15 PM     [place] [address] [phone] (office) [phone] (fax)

## 2016-10-26 NOTE — Patient Instructions (Addendum)
Medication Instructions:  Your physician has recommended you make the following change in your medication:  1. START Amiodarone -- start this medication on 11/07/16  - take 400 mg (2 tablet total) twice a day for 2 weeks, then  - take 200 mg (1 tablet total) twice a day for 2 weeks, then  - take 200 mg (1 tablet total) once a day  If you need a refill on your cardiac medications before your next appointment, please call your pharmacy.   Labwork: None ordered  Testing/Procedures: Your physician has recommended that you have a Cardioversion (DCCV). Electrical Cardioversion uses a jolt of electricity to your heart either through paddles or wired patches attached to your chest. This is a controlled, usually prescheduled, procedure. Defibrillation is done under light anesthesia in the hospital, and you usually go home the day of the procedure. This is done to get your heart back into a normal rhythm. You are not awake for the procedure.       Danett Palazzo, RN will call you to arrange this procedure around the week of September 17th.  Follow-Up: Your physician recommends that you schedule a follow-up appointment in: 3 months with Dr. Curt Bears.  Thank you for choosing [facility]!!   Trinidad Curet, RN [phone]  Any Other Special Instructions Will Be Listed Below (If Applicable).   Electrical Cardioversion Electrical cardioversion is the delivery of a jolt of electricity to restore a normal rhythm to the heart. A rhythm that is too fast or is not regular keeps the heart from pumping well. In this procedure, sticky patches or metal paddles are placed on the chest to deliver electricity to the heart from a device. This procedure may be done in an emergency if:  There is low or no blood pressure as a result of the heart rhythm.  Normal rhythm must be restored as fast as possible to protect the brain and heart from further damage.  It may save a life.  This procedure may also be done for  irregular or fast heart rhythms that are not immediately life-threatening. Tell a health care provider about:  Any allergies you have.  All medicines you are taking, including vitamins, herbs, eye drops, creams, and over-the-counter medicines.  Any problems you or family members have had with anesthetic medicines.  Any blood disorders you have.  Any surgeries you have had.  Any medical conditions you have.  Whether you are pregnant or may be pregnant. What are the risks? Generally, this is a safe procedure. However, problems may occur, including:  Allergic reactions to medicines.  A blood clot that breaks free and travels to other parts of your body.  The possible return of an abnormal heart rhythm within hours or days after the procedure.  Your heart stopping (cardiac arrest). This is rare.  What happens before the procedure? Medicines  Your health care provider may have you start taking: ? Blood-thinning medicines (anticoagulants) so your blood does not clot as easily. ? Medicines may be given to help stabilize your heart rate and rhythm.  Ask your health care provider about changing or stopping your regular medicines. This is especially important if you are taking diabetes medicines or blood thinners. General instructions  Plan to have someone take you home from the hospital or clinic.  If you will be going home right after the procedure, plan to have someone with you for 24 hours.  Follow instructions from your health care provider about eating or drinking restrictions. What happens  during the procedure?  To lower your risk of infection: ? Your health care team will wash or sanitize their hands. ? Your skin will be washed with soap.  An IV tube will be inserted into one of your veins.  You will be given a medicine to help you relax (sedative).  Sticky patches (electrodes) or metal paddles may be placed on your chest.  An electrical shock will be  delivered. The procedure may vary among health care providers and hospitals. What happens after the procedure?  Your blood pressure, heart rate, breathing rate, and blood oxygen level will be monitored until the medicines you were given have worn off.  Do not drive for 24 hours if you were given a sedative.  Your heart rhythm will be watched to make sure it does not change. This information is not intended to replace advice given to you by your health care provider. Make sure you discuss any questions you have with your health care provider. Document Released: 02/05/2002 Document Revised: 10/15/2015 Document Reviewed: 08/22/2015 Elsevier Interactive Patient Education  2017 [employer].

## 2016-11-04 ENCOUNTER — Telehealth: Payer: Self-pay | Admitting: *Deleted

## 2016-11-04 MED ORDER — AMIODARONE HCL 200 MG PO TABS
200.0000 mg | ORAL_TABLET | Freq: Every day | ORAL | 1 refills | Status: DC
Start: 2016-11-04 — End: 2016-11-09

## 2016-11-04 MED ORDER — AMIODARONE HCL 200 MG PO TABS: ORAL_TABLET | ORAL | 0 refills | Status: DC

## 2016-11-04 NOTE — Telephone Encounter (Signed)
lmtcb to arrange DCCV 

## 2016-11-09 ENCOUNTER — Telehealth: Payer: Self-pay | Admitting: Cardiology

## 2016-11-09 MED ORDER — AMIODARONE HCL 200 MG PO TABS: 200.0000 mg | ORAL_TABLET | Freq: Every day | ORAL | 1 refills | Status: DC

## 2016-11-09 MED ORDER — AMIODARONE HCL 200 MG PO TABS: ORAL_TABLET | ORAL | 0 refills | Status: DC

## 2016-11-09 NOTE — Telephone Encounter (Signed)
Ok to speak to dtr. Dtr reports mom has not started Amiodarone yet, rx not at pharmacy. Informed that I sent rx on 9/6, but will resend to pharmacy and then call to verify they received this time. Reviewed Amio loading instructions again with dtr. Informed that I would follow up later this week/next to scheduled DCCV. Dtr thanks me for helping and agreeable to plan.

## 2016-11-09 NOTE — Telephone Encounter (Signed)
New Message  Pt daughter call requesting to speak with RN to f/u on medication that was to be sent to the pharmacy. She does not know the name of the medication .please call back to discuss

## 2016-11-09 NOTE — Telephone Encounter (Signed)
Follow up      Returning a call to the nurse.  Daughter will go to lunch at 1 and she leaves work at [employer]

## 2016-11-18 ENCOUNTER — Ambulatory Visit (INDEPENDENT_AMBULATORY_CARE_PROVIDER_SITE_OTHER): Payer: Medicare Other | Admitting: Internal Medicine

## 2016-11-18 ENCOUNTER — Ambulatory Visit (INDEPENDENT_AMBULATORY_CARE_PROVIDER_SITE_OTHER)
Admission: RE | Admit: 2016-11-18 | Discharge: 2016-11-18 | Disposition: A | Discharge status: Home / Self Care | Payer: Medicare Other | Source: Ambulatory Visit | Attending: Internal Medicine | Admitting: Internal Medicine

## 2016-11-18 ENCOUNTER — Encounter: Payer: Self-pay | Admitting: Internal Medicine

## 2016-11-18 ENCOUNTER — Telehealth: Payer: Self-pay | Admitting: Internal Medicine

## 2016-11-18 VITALS — BP 126/76 | HR 65 | Temp 97.8°F | Ht <= 58 in | Wt 126.0 lb

## 2016-11-18 DIAGNOSIS — R1030 Lower abdominal pain, unspecified: Secondary | ICD-10-CM

## 2016-11-18 DIAGNOSIS — R739 Hyperglycemia, unspecified: Secondary | ICD-10-CM | POA: Diagnosis not present

## 2016-11-18 DIAGNOSIS — R079 Chest pain, unspecified: Secondary | ICD-10-CM

## 2016-11-18 DIAGNOSIS — I10 Essential (primary) hypertension: Secondary | ICD-10-CM

## 2016-11-18 LAB — POCT URINALYSIS DIPSTICK
Bilirubin, UA: NEGATIVE
Glucose, UA: NEGATIVE
Ketones, UA: NEGATIVE
NITRITE UA: NEGATIVE
PH UA: 6 (ref 5.0–8.0)
Protein, UA: NEGATIVE
RBC UA: NEGATIVE
SPEC GRAV UA: 1.025 (ref 1.010–1.025)
UROBILINOGEN UA: 0.2 U/dL

## 2016-11-18 NOTE — Telephone Encounter (Signed)
Pt would like the results of her x-rays, etc from today's visit to be called to her Jeanella Anton on his cell @ [phone].  Pt is currently staying with him.

## 2016-11-18 NOTE — Patient Instructions (Signed)
Please continue all other medications as before, and refills have been done if requested.  Please have the pharmacy call with any other refills you may need.  Please continue your efforts at being more active, low cholesterol diet, and weight control.  Please keep your appointments with your specialists as you may have planned  Please go to the XRAY Department in the Basement (go straight as you get off the elevator) for the x-ray testing  You will be contacted by phone if any changes need to be made immediately.  Otherwise, you will receive a letter about your results with an explanation, but please check with MyChart first.  Please remember to sign up for MyChart if you have not done so, as this will be important to you in the future with finding out test results, communicating by private email, and scheduling acute appointments online when needed.  

## 2016-11-18 NOTE — Progress Notes (Signed)
Subjective:    Patient ID: Cathy [name], female    DOB: April 06, 1927, 83 y.o.   MRN: [id number]  HPI   Here to f/u after an unfortunate slip and fall in the tub and landing on the right lateral chest to the hard edge of the tub 2 days ago.  Having marked pain but wants to continue tylenol only.  Pt denies other chest pain, increased sob or doe, wheezing, orthopnea, PND, increased LE swelling, palpitations, dizziness or syncope.  Pt denies new neurological symptoms such as new headache, or facial or extremity weakness or numbness   Pt denies polydipsia, polyuria.   Due for cardioversion soon.  No fever, ST, cough. Past Medical History:  Diagnosis Date  . Allergic rhinitis   . Anemia   . Anxiety   . Chronic systolic CHF (congestive heart failure) ([place])    a. 2D Echo 02/02/15: EF 25-30%, akinesis of mid-apical anteroseptal myocardium, grade 3 DD, mod MR, severely dilated LA, mildly reduced RV systoluc function, mod dilated RA, mod TR, mildly increased PASP 31mmHg.  . Colitis    lymphocytic colitis feb 2011  . Coronary artery calcification    a. By CT 01/2015.  . Diverticulosis of colon   . DJD (degenerative joint disease)   . Dysphagia   . Hiatal hernia   . Hypercholesteremia   . Hypertension   . Iron deficiency   . Irritable bowel syndrome   . Liver lesion 02/18/2015  . Lumbar back pain   . Osteoporosis   . Paroxysmal atrial flutter ([place])    a. Dx 01/2015 - underwent DCCV 03/2015.  [name] Sinus bradycardia   . Splenic lesion 02/18/2015  . Urinary incontinence   . Venous insufficiency   . Vitamin B12 deficiency    Past Surgical History:  Procedure Laterality Date  . ABDOMINAL HYSTERECTOMY    . CARDIOVERSION N/A 02/03/2015   Procedure: CARDIOVERSION;  Surgeon: Sueanne Margarita, MD;  Location: [facility] ENDOSCOPY;  Service: Cardiovascular;  Laterality: N/A;  . CARDIOVERSION N/A 03/10/2015   Procedure: CARDIOVERSION;  Surgeon: Dorothy Spark, MD;  Location: [facility] ENDOSCOPY;  Service: Cardiovascular;   Laterality: N/A;  . cataract surgery    . decompressive lumbar laminectomy  06/2008   at L2-3, L3-4 and L4-5 by Dr. Ronnald Ramp  . LAPAROSCOPIC CHOLECYSTECTOMY  04/2009   Dr. Abran Cantor  . REPLACEMENT TOTAL KNEE    . TEE WITHOUT CARDIOVERSION N/A 02/03/2015   Procedure: TRANSESOPHAGEAL ECHOCARDIOGRAM (TEE);  Surgeon: Sueanne Margarita, MD;  Location: [facility] ENDOSCOPY;  Service: Cardiovascular;  Laterality: N/A;    reports that she has quit smoking. Her smoking use included Cigarettes. She has a 5.00 pack-year smoking history. She has never used smokeless tobacco. She reports that she does not drink alcohol or use drugs. family history includes Diabetes in her brother; Heart attack in her brother and mother. Allergies  Allergen Reactions  . Amoxicillin-Pot Clavulanate     REACTION: diarrhea  . Sulfonamide Derivatives Other (See Comments)    unknown   Current Outpatient Prescriptions on File Prior to Visit  Medication Sig Dispense Refill  . amiodarone (PACERONE) 200 MG tablet Loading dose:  Take 2 tablets (400 mg total) twice daily for two weeks.  Then take 1 tablet (200 mg total) twice daily for two weeks. 84 tablet 0  . amiodarone (PACERONE) 200 MG tablet Take 1 tablet (200 mg total) by mouth daily. 90 tablet 1  . atorvastatin (LIPITOR) 20 MG tablet TAKE 1 TABLET DAILY AT 6 P.M.  90 tablet 2  . benzonatate (TESSALON PERLES) 100 MG capsule Take 1 capsule (100 mg total) by mouth 2 (two) times daily as needed for cough. 60 capsule 0  . calcium-vitamin D (OSCAL WITH D) 500-200 MG-UNIT per tablet Take 1 tablet by mouth daily.     . cholecalciferol (VITAMIN D) 1000 UNITS tablet Take 1,000 Units by mouth daily.    . diclofenac (FLECTOR) 1.3 % PTCH Place 1 patch onto the skin every 12 (twelve) hours. Apply as directed by Dr. Brien Few    . ELIQUIS 2.5 MG TABS tablet TAKE 1 TABLET TWICE A DAY 180 tablet 1  . esomeprazole (NEXIUM) 40 MG capsule TAKE 1 CAPSULE DAILY 90 capsule 2  . Ferrous Sulfate Dried (FEOSOL) 200  (65 FE) MG TABS Take 1 tablet by mouth 2 (two) times daily. Take with a vitamin c 500mg      . fexofenadine (ALLEGRA) 180 MG tablet Take 180 mg by mouth daily as needed for allergies or rhinitis.    [name] isosorbide mononitrate (IMDUR) 30 MG 24 hr tablet Take 1 tablet (30 mg total) by mouth daily. Last attempt please call [phone] to schedule appointment for additional refills thanks. 15 tablet 0  . isosorbide mononitrate (IMDUR) 30 MG 24 hr tablet Take 1 tablet (30 mg total) by mouth daily. 90 tablet 3  . oxyCODONE-acetaminophen (PERCOCET) 5-325 MG per tablet Take 1 tablet by mouth every 6 (six) hours as needed for moderate pain.     . polyethylene glycol (MIRALAX / GLYCOLAX) packet Take 17 g by mouth 2 (two) times daily. 28 each 0  . pregabalin (LYRICA) 50 MG capsule Take 1 capsule (50 mg total) by mouth 3 (three) times daily. 270 capsule 1  . PROLIA 60 MG/ML SOLN injection Inject 60 mg into the skin every 6 (six) months.     . solifenacin (VESICARE) 10 MG tablet Take 1 tablet (10 mg total) by mouth daily. 90 tablet 1  . vitamin B-12 (CYANOCOBALAMIN) 1000 MCG tablet Take 1 tablet (1,000 mcg total) by mouth daily.    . Vitamin D, Ergocalciferol, (DRISDOL) 50000 units CAPS capsule Take 1 capsule (50,000 Units total) by mouth every 7 (seven) days. 12 capsule 0   No current facility-administered medications on file prior to visit.    Review of Systems  Constitutional: Negative for other unusual diaphoresis or sweats HENT: Negative for ear discharge or swelling Eyes: Negative for other worsening visual disturbances Respiratory: Negative for stridor or other swelling  Gastrointestinal: Negative for worsening distension or other blood Genitourinary: Negative for retention or other urinary change Musculoskeletal: Negative for other MSK pain or swelling Skin: Negative for color change or other new lesions Neurological: Negative for worsening tremors and other numbness  Psychiatric/Behavioral:  Negative for worsening agitation or other fatigue All other system neg per pt    Objective:   Physical Exam BP 126/76   Pulse 65   Temp 97.8 F (36.6 C) (Oral)   Ht 4\' 9"  (1.448 m)   Wt 126 lb (57.2 kg)   SpO2 97%   BMI 27.27 kg/m  VS noted,  Constitutional: Pt appears in NAD HENT: Head: NCAT.  Right Ear: External ear normal.  Left Ear: External ear normal.  Eyes: . Pupils are equal, round, and reactive to light. Conjunctivae and EOM are normal Nose: without d/c or deformity Neck: Neck supple. Gross normal ROM Cardiovascular: Normal rate and irregular rhythm.   Pulmonary/Chest: Effort normal and breath sounds without rales or wheezing.  Right lateral  chest wall tender about t6-7 at the mid and anterior axillary lines Abd:  Soft, NT, ND, + BS, no organomegaly Neurological: Pt is alert. At baseline orientation, motor grossly intact Skin: Skin is warm. No rashes, other new lesions, no LE edema Psychiatric: Pt behavior is normal without agitation  All other system neg per pt       Assessment & Plan:

## 2016-11-18 NOTE — Telephone Encounter (Signed)
FYI

## 2016-11-19 NOTE — Telephone Encounter (Signed)
There were 2 right sided rib fx, no specific tx is needed  No other acute problems found in rib or cxr

## 2016-11-19 NOTE — Telephone Encounter (Signed)
Pt's son, Joneen Boers, has been informed and expressed understanding.

## 2016-11-20 NOTE — Assessment & Plan Note (Signed)
stable overall by history and exam, recent data reviewed with pt, and pt to continue medical treatment as before,  to f/u any worsening symptoms or concerns BP Readings from Last 3 Encounters:  11/18/16 126/76  10/26/16 100/70  09/30/16 114/78

## 2016-11-20 NOTE — Assessment & Plan Note (Signed)
Lab Results  Component Value Date   HGBA1C 5.6 04/01/2016  stable overall by history and exam, recent data reviewed with pt, and pt to continue medical treatment as before,  to f/u any worsening symptoms or concerns

## 2016-11-20 NOTE — Assessment & Plan Note (Signed)
Possible right rib fracture, for cxr and rib films, tylenol prn,  to f/u any worsening symptoms or concerns

## 2016-11-23 ENCOUNTER — Telehealth: Payer: Self-pay | Admitting: Cardiology

## 2016-11-23 NOTE — Telephone Encounter (Signed)
°  New Message   pt daughter verbalized that she is calling for rn   Pt has fallen and has fractured two ribs and she will need 6 weeks to heal   Cardioversion scheduling

## 2016-11-24 NOTE — Telephone Encounter (Signed)
Dtr calling to let me know patient fell and fractured 2 ribs and will be healing for the next 6 weeks.  When being evaluated at the hospital pt was in NSR w/ HR of 65. Advised to continue to monitor and call the office if she begins to experience issues w/ flutter, otherwise we will see pt at December follow up. Will not schedule DCCV at this time. Patient's dtr verbalized understanding and agreeable to plan.

## 2016-11-27 ENCOUNTER — Encounter (HOSPITAL_COMMUNITY): Payer: Self-pay | Admitting: Emergency Medicine

## 2016-11-27 ENCOUNTER — Emergency Department (HOSPITAL_COMMUNITY): Payer: Medicare Other

## 2016-11-27 ENCOUNTER — Inpatient Hospital Stay (HOSPITAL_COMMUNITY): Payer: Medicare Other

## 2016-11-27 ENCOUNTER — Inpatient Hospital Stay (HOSPITAL_COMMUNITY)
Admission: EM | Admit: 2016-11-27 | Discharge: 2016-12-03 | DRG: 388 | Disposition: A | Discharge status: Home / Self Care | Payer: Medicare Other | Attending: Internal Medicine | Admitting: Internal Medicine

## 2016-11-27 DIAGNOSIS — Z6833 Body mass index (BMI) 33.0-33.9, adult: Secondary | ICD-10-CM

## 2016-11-27 DIAGNOSIS — J9601 Acute respiratory failure with hypoxia: Secondary | ICD-10-CM | POA: Diagnosis present

## 2016-11-27 DIAGNOSIS — J90 Pleural effusion, not elsewhere classified: Secondary | ICD-10-CM

## 2016-11-27 DIAGNOSIS — E669 Obesity, unspecified: Secondary | ICD-10-CM | POA: Diagnosis present

## 2016-11-27 DIAGNOSIS — Z8249 Family history of ischemic heart disease and other diseases of the circulatory system: Secondary | ICD-10-CM

## 2016-11-27 DIAGNOSIS — Z9049 Acquired absence of other specified parts of digestive tract: Secondary | ICD-10-CM | POA: Diagnosis not present

## 2016-11-27 DIAGNOSIS — I2510 Atherosclerotic heart disease of native coronary artery without angina pectoris: Secondary | ICD-10-CM | POA: Diagnosis present

## 2016-11-27 DIAGNOSIS — I483 Typical atrial flutter: Secondary | ICD-10-CM | POA: Diagnosis not present

## 2016-11-27 DIAGNOSIS — S2249XA Multiple fractures of ribs, unspecified side, initial encounter for closed fracture: Secondary | ICD-10-CM | POA: Diagnosis present

## 2016-11-27 DIAGNOSIS — Z90710 Acquired absence of both cervix and uterus: Secondary | ICD-10-CM

## 2016-11-27 DIAGNOSIS — E876 Hypokalemia: Secondary | ICD-10-CM | POA: Diagnosis present

## 2016-11-27 DIAGNOSIS — R531 Weakness: Secondary | ICD-10-CM | POA: Diagnosis present

## 2016-11-27 DIAGNOSIS — I4891 Unspecified atrial fibrillation: Secondary | ICD-10-CM | POA: Diagnosis present

## 2016-11-27 DIAGNOSIS — Z9889 Other specified postprocedural states: Secondary | ICD-10-CM

## 2016-11-27 DIAGNOSIS — I4892 Unspecified atrial flutter: Secondary | ICD-10-CM | POA: Diagnosis present

## 2016-11-27 DIAGNOSIS — R740 Nonspecific elevation of levels of transaminase and lactic acid dehydrogenase [LDH]: Secondary | ICD-10-CM | POA: Diagnosis present

## 2016-11-27 DIAGNOSIS — S2239XA Fracture of one rib, unspecified side, initial encounter for closed fracture: Secondary | ICD-10-CM | POA: Diagnosis present

## 2016-11-27 DIAGNOSIS — M1990 Unspecified osteoarthritis, unspecified site: Secondary | ICD-10-CM | POA: Diagnosis present

## 2016-11-27 DIAGNOSIS — K921 Melena: Secondary | ICD-10-CM | POA: Diagnosis present

## 2016-11-27 DIAGNOSIS — E279 Disorder of adrenal gland, unspecified: Secondary | ICD-10-CM | POA: Diagnosis present

## 2016-11-27 DIAGNOSIS — K449 Diaphragmatic hernia without obstruction or gangrene: Secondary | ICD-10-CM | POA: Diagnosis present

## 2016-11-27 DIAGNOSIS — S2241XD Multiple fractures of ribs, right side, subsequent encounter for fracture with routine healing: Secondary | ICD-10-CM

## 2016-11-27 DIAGNOSIS — Z87891 Personal history of nicotine dependence: Secondary | ICD-10-CM

## 2016-11-27 DIAGNOSIS — A09 Infectious gastroenteritis and colitis, unspecified: Secondary | ICD-10-CM

## 2016-11-27 DIAGNOSIS — R109 Unspecified abdominal pain: Secondary | ICD-10-CM

## 2016-11-27 DIAGNOSIS — Z833 Family history of diabetes mellitus: Secondary | ICD-10-CM

## 2016-11-27 DIAGNOSIS — R197 Diarrhea, unspecified: Secondary | ICD-10-CM | POA: Diagnosis present

## 2016-11-27 DIAGNOSIS — I110 Hypertensive heart disease with heart failure: Secondary | ICD-10-CM | POA: Diagnosis present

## 2016-11-27 DIAGNOSIS — K5641 Fecal impaction: Principal | ICD-10-CM | POA: Diagnosis present

## 2016-11-27 DIAGNOSIS — Z7901 Long term (current) use of anticoagulants: Secondary | ICD-10-CM

## 2016-11-27 DIAGNOSIS — Z79899 Other long term (current) drug therapy: Secondary | ICD-10-CM

## 2016-11-27 DIAGNOSIS — I5042 Chronic combined systolic (congestive) and diastolic (congestive) heart failure: Secondary | ICD-10-CM | POA: Diagnosis not present

## 2016-11-27 DIAGNOSIS — I5033 Acute on chronic diastolic (congestive) heart failure: Secondary | ICD-10-CM | POA: Diagnosis present

## 2016-11-27 DIAGNOSIS — I509 Heart failure, unspecified: Secondary | ICD-10-CM

## 2016-11-27 DIAGNOSIS — R1013 Epigastric pain: Secondary | ICD-10-CM | POA: Diagnosis not present

## 2016-11-27 DIAGNOSIS — Z66 Do not resuscitate: Secondary | ICD-10-CM | POA: Diagnosis present

## 2016-11-27 DIAGNOSIS — R945 Abnormal results of liver function studies: Secondary | ICD-10-CM

## 2016-11-27 DIAGNOSIS — I447 Left bundle-branch block, unspecified: Secondary | ICD-10-CM | POA: Diagnosis present

## 2016-11-27 DIAGNOSIS — M810 Age-related osteoporosis without current pathological fracture: Secondary | ICD-10-CM | POA: Diagnosis present

## 2016-11-27 DIAGNOSIS — Z96659 Presence of unspecified artificial knee joint: Secondary | ICD-10-CM | POA: Diagnosis present

## 2016-11-27 DIAGNOSIS — N39490 Overflow incontinence: Secondary | ICD-10-CM | POA: Diagnosis present

## 2016-11-27 DIAGNOSIS — R0602 Shortness of breath: Secondary | ICD-10-CM

## 2016-11-27 DIAGNOSIS — Z882 Allergy status to sulfonamides status: Secondary | ICD-10-CM

## 2016-11-27 DIAGNOSIS — K6289 Other specified diseases of anus and rectum: Secondary | ICD-10-CM | POA: Diagnosis present

## 2016-11-27 DIAGNOSIS — D7389 Other diseases of spleen: Secondary | ICD-10-CM | POA: Diagnosis present

## 2016-11-27 DIAGNOSIS — R627 Adult failure to thrive: Secondary | ICD-10-CM | POA: Diagnosis not present

## 2016-11-27 DIAGNOSIS — Z880 Allergy status to penicillin: Secondary | ICD-10-CM

## 2016-11-27 DIAGNOSIS — D1800 Hemangioma unspecified site: Secondary | ICD-10-CM | POA: Diagnosis present

## 2016-11-27 DIAGNOSIS — R7989 Other specified abnormal findings of blood chemistry: Secondary | ICD-10-CM

## 2016-11-27 DIAGNOSIS — S2241XA Multiple fractures of ribs, right side, initial encounter for closed fracture: Secondary | ICD-10-CM | POA: Diagnosis present

## 2016-11-27 LAB — I-STAT CHEM 8, ED
BUN: 14 mg/dL (ref 6–20)
Calcium, Ion: 1.05 mmol/L — ABNORMAL LOW (ref 1.15–1.40)
Chloride: 104 mmol/L (ref 101–111)
Creatinine, Ser: 0.7 mg/dL (ref 0.44–1.00)
Glucose, Bld: 79 mg/dL (ref 65–99)
HCT: 31 % — ABNORMAL LOW (ref 36.0–46.0)
HEMOGLOBIN: 10.5 g/dL — AB (ref 12.0–15.0)
Potassium: 3.9 mmol/L (ref 3.5–5.1)
Sodium: 141 mmol/L (ref 135–145)
TCO2: 26 mmol/L (ref 22–32)

## 2016-11-27 LAB — CBC WITH DIFFERENTIAL/PLATELET
Basophils Absolute: 0 10*3/uL (ref 0.0–0.1)
Basophils Relative: 0 %
EOS ABS: 0 10*3/uL (ref 0.0–0.7)
Eosinophils Relative: 0 %
HCT: 37.6 % (ref 36.0–46.0)
HEMOGLOBIN: 12.2 g/dL (ref 12.0–15.0)
LYMPHS ABS: 0.7 10*3/uL (ref 0.7–4.0)
Lymphocytes Relative: 4 %
MCH: 30.3 pg (ref 26.0–34.0)
MCHC: 32.4 g/dL (ref 30.0–36.0)
MCV: 93.5 fL (ref 78.0–100.0)
MONO ABS: 1.8 10*3/uL — AB (ref 0.1–1.0)
MONOS PCT: 10 %
NEUTROS PCT: 86 %
Neutro Abs: 16 10*3/uL — ABNORMAL HIGH (ref 1.7–7.7)
Platelets: 190 10*3/uL (ref 150–400)
RBC: 4.02 MIL/uL (ref 3.87–5.11)
RDW: 14.2 % (ref 11.5–15.5)
WBC: 18.6 10*3/uL — ABNORMAL HIGH (ref 4.0–10.5)

## 2016-11-27 LAB — COMPREHENSIVE METABOLIC PANEL
ALK PHOS: 94 U/L (ref 38–126)
ALT: 35 U/L (ref 14–54)
ANION GAP: 6 (ref 5–15)
AST: 32 U/L (ref 15–41)
Albumin: 3.1 g/dL — ABNORMAL LOW (ref 3.5–5.0)
BUN: 14 mg/dL (ref 6–20)
CALCIUM: 8.6 mg/dL — AB (ref 8.9–10.3)
CO2: 28 mmol/L (ref 22–32)
Chloride: 104 mmol/L (ref 101–111)
Creatinine, Ser: 0.81 mg/dL (ref 0.44–1.00)
GFR calc non Af Amer: >60 mL/min (ref >60)
Glucose, Bld: 90 mg/dL (ref 65–99)
POTASSIUM: 4.5 mmol/L (ref 3.5–5.1)
SODIUM: 138 mmol/L (ref 135–145)
TOTAL PROTEIN: 5.9 g/dL — AB (ref 6.5–8.1)
Total Bilirubin: 0.9 mg/dL (ref 0.3–1.2)

## 2016-11-27 LAB — TYPE AND SCREEN
ABO/RH(D): A POS
ANTIBODY SCREEN: NEGATIVE

## 2016-11-27 LAB — POC OCCULT BLOOD, ED: FECAL OCCULT BLD: POSITIVE — AB

## 2016-11-27 LAB — C DIFFICILE QUICK SCREEN W PCR REFLEX
C DIFFICILE (CDIFF) INTERP: NOT DETECTED
C DIFFICILE (CDIFF) TOXIN: NEGATIVE
C Diff antigen: NEGATIVE

## 2016-11-27 LAB — LIPASE, BLOOD: Lipase: 20 U/L (ref 11–51)

## 2016-11-27 MED ORDER — SODIUM CHLORIDE 0.9 % IV BOLUS (SEPSIS)
1000.0000 mL | Freq: Once | INTRAVENOUS | Status: AC
  Administered 2016-11-27: 1000 mL via INTRAVENOUS

## 2016-11-27 MED ORDER — IOPAMIDOL (ISOVUE-300) INJECTION 61%
INTRAVENOUS | Status: AC
  Administered 2016-11-27: 80 mL via INTRAVENOUS
  Filled 2016-11-27: qty 100

## 2016-11-27 NOTE — H&P (Signed)
History and Physical    Cathy [name]:[id number] DOB: May 15, 1927 DOA: 11/27/2016  PCP: Biagio Borg, MD Consultants:  Baird Kay - cardiology; Brien Few - chronic pain; Tamala Julian - orthopedics Patient coming from:  Home - lives alone; [facility]: Daughter, [phone]; Son, [phone]  Chief Complaint: diarrhea  HPI: Cathy [name] is a 83 y.o. female with medical history significant of afib on Eliquis; HTN; HLD; systolic CHF; anxiety; and arthritis presenting with diarrhea starting yesterday.  She has been staying with her granddaughter for the last week - she fell a week ago Thursday and fell and fractured 2 ribs.  Significant respiratory congestion nightly for the last 3-4 nights.  No fevers.  Abdominal pain, thought she was impacted.  She took 2 Miralax yesterday.  Diarrhea started about 9pm and it has been nonstop since then.  She stands up and it just comes out.  It has been dark colored and liquidy throughout.  No bright red blood, only melena.  She took 2 Imodium without relief.  Abdominal pain started maybe Wednesday or Thursday of last week.  She was given antibiotics in August for a UTI, has a h/o UTIs that "make her very ill very quickly".   ED Course: Diarrhea - recent abx, C diff negative.  Cough - check CXR, ?PNA.  +guaiac.  ?infectious diarrhea.  A/P CT pending.  Review of Systems: As per HPI; otherwise review of systems reviewed and negative.   Ambulatory Status:  Ambulates with a cane>walker  Past Medical History:  Diagnosis Date  . Allergic rhinitis   . Anemia   . Anxiety   . Chronic systolic CHF (congestive heart failure) ([place])    a. 2D Echo 02/02/15: EF 25-30%, akinesis of mid-apical anteroseptal myocardium, grade 3 DD, mod MR, severely dilated LA, mildly reduced RV systoluc function, mod dilated RA, mod TR, mildly increased PASP 67mmHg.  . Colitis    lymphocytic colitis feb 2011  . Coronary artery calcification    a. By CT 01/2015.  . Diverticulosis of colon   . DJD  (degenerative joint disease)    gets epidural injections  . Dysphagia   . Hiatal hernia   . Hypercholesteremia   . Hypertension   . Iron deficiency   . Irritable bowel syndrome   . Liver lesion 02/18/2015  . Lumbar back pain   . Osteoporosis   . Paroxysmal atrial flutter ([place])    a. Dx 01/2015 - underwent DCCV 03/2015; takes Eliquis  . Sinus bradycardia   . Splenic lesion 02/18/2015  . Urinary incontinence   . Venous insufficiency   . Vitamin B12 deficiency     Past Surgical History:  Procedure Laterality Date  . ABDOMINAL HYSTERECTOMY    . CARDIOVERSION N/A 02/03/2015   Procedure: CARDIOVERSION;  Surgeon: Sueanne Margarita, MD;  Location: [facility] ENDOSCOPY;  Service: Cardiovascular;  Laterality: N/A;  . CARDIOVERSION N/A 03/10/2015   Procedure: CARDIOVERSION;  Surgeon: Dorothy Spark, MD;  Location: [facility] ENDOSCOPY;  Service: Cardiovascular;  Laterality: N/A;  . cataract surgery    . decompressive lumbar laminectomy  06/2008   at L2-3, L3-4 and L4-5 by Dr. Ronnald Ramp  . LAPAROSCOPIC CHOLECYSTECTOMY  04/2009   Dr. Abran Cantor  . REPLACEMENT TOTAL KNEE    . TEE WITHOUT CARDIOVERSION N/A 02/03/2015   Procedure: TRANSESOPHAGEAL ECHOCARDIOGRAM (TEE);  Surgeon: Sueanne Margarita, MD;  Location: [facility] ENDOSCOPY;  Service: Cardiovascular;  Laterality: N/A;    Social History   Social History  . Marital status: Widowed  Spouse name: N/A  . Number of children: N/A  . Years of education: N/A   Occupational History  . retired    Social History Main Topics  . Smoking status: Former Smoker    Packs/day: 0.50    Years: 10.00    Types: Cigarettes  . Smokeless tobacco: Never Used  . Alcohol use No  . Drug use: No  . Sexual activity: No   Other Topics Concern  . Not on file   Social History Narrative  . No narrative on file    Allergies  Allergen Reactions  . Amoxicillin-Pot Clavulanate Diarrhea    Has patient had a PCN reaction causing immediate rash, facial/tongue/throat swelling, SOB or  lightheadedness with hypotension: no Has patient had a PCN reaction causing severe rash involving mucus membranes or skin necrosis:  no Has patient had a PCN reaction that required hospitalization: no Has patient had a PCN reaction occurring within the last 10 years no If all of the above answers are "NO", then may proceed with Cephalosporin use.   . Sulfonamide Derivatives Other (See Comments)    unknown    Family History  Problem Relation Age of Onset  . Heart attack Mother   . Heart attack Brother   . Diabetes Brother   . Hypertension Neg Hx   . Fainting Neg Hx   . Stroke Neg Hx     Prior to Admission medications   Medication Sig Start Date End Date Taking? Authorizing Provider  amiodarone (PACERONE) 200 MG tablet Loading dose:  Take 2 tablets (400 mg total) twice daily for two weeks.  Then take 1 tablet (200 mg total) twice daily for two weeks. 11/09/16   Camnitz, Ocie Doyne, MD  amiodarone (PACERONE) 200 MG tablet Take 1 tablet (200 mg total) by mouth daily. 11/09/16   Camnitz, Will Hassell Done, MD  atorvastatin (LIPITOR) 20 MG tablet TAKE 1 TABLET DAILY AT 6 P.M. 10/01/16   Biagio Borg, MD  benzonatate (TESSALON PERLES) 100 MG capsule Take 1 capsule (100 mg total) by mouth 2 (two) times daily as needed for cough. 09/30/16 09/30/17  Biagio Borg, MD  calcium-vitamin D (OSCAL WITH D) 500-200 MG-UNIT per tablet Take 1 tablet by mouth daily.     [provider]  cholecalciferol (VITAMIN D) 1000 UNITS tablet Take 1,000 Units by mouth daily.    [provider]  diclofenac (FLECTOR) 1.3 % PTCH Place 1 patch onto the skin every 12 (twelve) hours. Apply as directed by Dr. Brien Few    [provider]  ELIQUIS 2.5 MG TABS tablet TAKE 1 TABLET TWICE A DAY 08/30/16   Camnitz, Will Hassell Done, MD  esomeprazole (NEXIUM) 40 MG capsule TAKE 1 CAPSULE DAILY 03/26/16   Biagio Borg, MD  Ferrous Sulfate Dried (FEOSOL) 200 (65 FE) MG TABS Take 1 tablet by mouth 2 (two) times daily. Take  with a vitamin c 500mg      [provider]  fexofenadine (ALLEGRA) 180 MG tablet Take 180 mg by mouth daily as needed for allergies or rhinitis.    [provider]  isosorbide mononitrate (IMDUR) 30 MG 24 hr tablet Take 1 tablet (30 mg total) by mouth daily. Last attempt please call [phone] to schedule appointment for additional refills thanks. 08/26/16   Camnitz, Ocie Doyne, MD  isosorbide mononitrate (IMDUR) 30 MG 24 hr tablet Take 1 tablet (30 mg total) by mouth daily. 09/22/16   Camnitz, Ocie Doyne, MD  oxyCODONE-acetaminophen (PERCOCET) 5-325 MG per tablet Take  1 tablet by mouth every 6 (six) hours as needed for moderate pain.     [provider]  polyethylene glycol (MIRALAX / GLYCOLAX) packet Take 17 g by mouth 2 (two) times daily. 10/22/14   Palumbo, April, MD  pregabalin (LYRICA) 50 MG capsule Take 1 capsule (50 mg total) by mouth 3 (three) times daily. 07/13/16   Biagio Borg, MD  PROLIA 60 MG/ML SOLN injection Inject 60 mg into the skin every 6 (six) months.  03/19/14   [provider]  solifenacin (VESICARE) 10 MG tablet Take 1 tablet (10 mg total) by mouth daily. 06/17/14   Rowe Clack, MD  vitamin B-12 (CYANOCOBALAMIN) 1000 MCG tablet Take 1 tablet (1,000 mcg total) by mouth daily. 06/17/14   Rowe Clack, MD  Vitamin D, Ergocalciferol, (DRISDOL) 50000 units CAPS capsule Take 1 capsule (50,000 Units total) by mouth every 7 (seven) days. 05/27/16   Lyndal Pulley, DO    Physical Exam: Vitals:   11/27/16 2200 11/27/16 2230 11/27/16 2300 11/28/16 0008  BP: (!) 156/69 (!) 162/77 (!) 160/108 (!) 173/80  Pulse: 71 65 65 72  Resp: (!) 21 (!) 21 20 19   Temp:      TempSrc:      SpO2: 92% 94% 92% 92%  Weight:      Height:         General:  Appears calm and comfortable and is NAD Eyes:  PERRL, EOMI, normal lids, iris ENT:  grossly normal hearing, lips & tongue, mmm Neck:  no LAD, masses or thyromegaly; no carotid  bruits Cardiovascular:  RRR, no m/r/g. 1+ LE edema.  Respiratory:   CTA bilaterally with no wheezes/rales/rhonchi.  Normal respiratory effort. Abdomen:  soft, NT, ND, NABS; marked ecchymosis of entire lower right breast as well as rib area just below the breast Skin:  no rash or induration seen on limited exam Musculoskeletal:  grossly normal tone BUE/BLE, good ROM, no bony abnormality Psychiatric:  grossly normal mood and affect, speech fluent and appropriate, AOx3 Neurologic:  CN 2-12 grossly intact, moves all extremities in coordinated fashion, sensation intact    Radiological Exams on Admission: Dg Chest 2 View  Result Date: 11/27/2016 CLINICAL DATA:  Productive cough x1 week with right-sided rib fracture that occurred last week. EXAM: CHEST  2 VIEW COMPARISON:  11/18/2016 FINDINGS: Low lung volumes with interval increase in pulmonary vascular congestion and right effusion. Large hiatal hernia projects over the heart. Aortic atherosclerosis without aneurysm. Osteoarthritis of the AC glenohumeral joints bilaterally. Thoracic spondylosis. No acute displaced rib fracture is apparent on current exam provided. IMPRESSION: 1. Low lung volumes are again noted with large hiatal hernia. 2. Interval increase in pulmonary vascular congestion and small right effusion. Findings likely represent mild CHF. 3. Aortic atherosclerosis. Electronically Signed   By: Ashley Royalty M.D.   On: 11/27/2016 20:00   Ct Abdomen Pelvis W Contrast  Result Date: 11/27/2016 CLINICAL DATA:  Abdominal pain and diarrhea. Fall 1 week ago with broken ribs. EXAM: CT ABDOMEN AND PELVIS WITH CONTRAST TECHNIQUE: Multidetector CT imaging of the abdomen and pelvis was performed using the standard protocol following bolus administration of intravenous contrast. CONTRAST:  80 cc Isovue-300 IV COMPARISON:  Most recent CT 01/31/2015 FINDINGS: Lower chest: Moderate right pleural effusion measuring simple fluid density. Mrs. partially loculated.  Adjacent atelectasis in the right lower lobe. Hiatal hernia likely causes mass effect on the bronchus intermedius with right middle lobe atelectasis. Trace left pleural effusion and adjacent atelectasis.  Fractures of anterior right sixth and seventh ribs. Hepatobiliary: No evidence of hepatic injury. Enhancing lesion adjacent with falciform ligament is not as well visualized on the current exam due to phase of contrast but grossly similar measuring 2.7 x 1.4 cm. Postcholecystectomy with stable extrahepatic and intrahepatic (left greater than right) biliary dilatation. Common bile duct measures 14 mm in mid distal portion. Pancreas: No ductal dilatation or inflammation. Spleen: Multiple low-density lesions throughout the spleen, with mild progression from prior exam. Largest lesion measures 3.2 x 2.9 cm, previously 2.8 x 2.7 cm. No perisplenic free fluid. Adrenals/Urinary Tract: Heterogeneous left adrenal mass measures 3.2 x 2.2 cm, unchanged from prior. No right adrenal nodule. No hydronephrosis or perinephric edema. Small cyst in the posterior left kidney is unchanged from prior. Homogeneous renal enhancement with symmetric excretion on delayed phase imaging. Urinary bladder is distended without wall thickening. Stomach/Bowel: Lack of enteric contrast limits assessment. Large hiatal hernia contains majority of the stomach as well as transverse colon. No evidence of gastric inflammation or abnormal distention. Small bowel is decompressed. Moderate stool in the ascending, transverse, and proximal descending colon, portions of transverse colon extend into hiatal hernia without wall thickening or obstruction. More distal descending and sigmoid colon are decompressed. Significant rectal distention with stool with wall thickening and perirectal soft tissue edema and fluid. Rectal distention of 7.5 x 6.7 cm. No liquid stool or colonic inflammation. No definite pneumatosis or evidence perforation. Vascular/Lymphatic:  Aortic and branch atherosclerosis. Aortic tortuosity. No aneurysm. Limited assessment for adenopathy given lack of enteric contrast and paucity of intra-abdominal fat. Reproductive: Status post hysterectomy. No adnexal masses. Other: Small amount of free fluid in the pelvis. No free air or intra-abdominal abscess. Mild mesenteric and body wall edema suggest third-spacing. Musculoskeletal: Scoliosis and multilevel degenerative change in the spine. No suspicious osseous abnormality. Acute fractures of right anterior sixth and seventh ribs. IMPRESSION: 1. Findings suspicious for fecal impaction and stercoral proctitis with stool ball distending the rectum, rectal wall thickening and perirectal edema/fluid. No colonic inflammation to suggest colitis. No bowel obstruction. 2. Large hiatal/paraesophageal hernia containing majority of the stomach in transverse colon, no obstruction. 3. Innumerable hypodense lesions scattered throughout the spleen, with only mild increase from exam 20 months prior, may be post infectious, inflammatory or neoplastic process. If neoplastic would be indolent given relative growth. 4. Left adrenal mass and enhancing liver lesion are unchanged. 5. Unchanged biliary dilatation postcholecystectomy. 6. Moderate right and small left pleural effusion, measuring simple fluid density, no hemothorax. Mesenteric and body wall edema. Overall findings suggest third-spacing/fluid overload. 7. Acute right anterior sixth and seventh rib fractures without associated pneumothorax or gross pulmonary contusion. Electronically Signed   By: Jeb Levering M.D.   On: 11/27/2016 22:21    EKG: Independently reviewed.  Afib with rate 60; IVCD; nonspecific ST changes with no evidence of acute ischemia   Labs on Admission: I have personally reviewed the available labs and imaging studies at the time of the admission.  Pertinent labs:   Albumin 3.1 WBC 18.6 C diff negative Hgb 12.2 Heme  positive  Assessment/Plan Principal Problem:   Diarrhea Active Problems:   Atrial flutter ([facility])   Rib fractures   CHF exacerbation ([facility])   Diarrhea -Patient thought to be impacted, took 2 Miralax and has had nonstop diarrhea since -Initial concern was for infectious colitis but she was C diff negative -CT appears to indicate impaction with overflow incontinence -Will request manual disimpaction -While she is heme positive, her Hgb  is normal - will trend and see if disimpaction clears the obstruction and the colonic irritant -Will continue Cipro/Flagyl for now given elevated WBC count and uncertain diagnosis, but possibly able to d/c antibiotics as early as tomorrow  CHF -Echo in 6/17 showed preserved EF with grade 2 diastolic dysfunction; she had a depressed EF in 2016 -Patient presenting with cough that worsens at night without significant LE edema -Initial concern for PNA but CXR more consistent with pulmonary edema and CT appears to agree -Elevated WBC count, but this may be reactive in nature or could indicate acute infection -BNP 348.3; previously 421.8 in 12/16 when hospitalized with acute heart failure -Patient with known afib, Rate controlled  -While PNA remains a consideration (and patient is on Cipro/Flagyl for now), CHF appears more likely as the cause for her cough -Will admit with telemetry -Will request echocardiogram -Will start Lisinopril 2.5 mg daily (BP is normal to high and so should support addition of medication) -No beta blocker due to acute decompensation on presentation -CHF order set utilized; may need CHF team consult but will hold until Echo results are available -Will give Lasix 40 mg IV x 1 and follow -prn  O2 for now -Normal kidney function at this time, will follow -Repeat EKG in AM  Rib fractures -Pain control adequate but not ideal with Percocet -Will add Lidocaine patch -Incentive spirometry -PT/OT consults  Aflutter -Rate controlled, on  Amiodarone  -On Eliquis -If ongoing concern for GI bleeding, may need to stop/hold Eliquis   DVT prophylaxis: Eliquis - may need to hold if ongoing bleeding concerns Code Status: DNR - confirmed with patient/family Family Communication: Daughter and granddaughter present throughout evaluation  Disposition Plan:  Home once clinically improved Consults called: PT/OT Admission status: Admit - It is my clinical opinion that admission to INPATIENT is reasonable and necessary because this patient will require at least 2 midnights in the hospital to treat this condition based on the medical complexity of the problems presented.  Given the aforementioned information, the predictability of an adverse outcome is felt to be significant.    Karmen Bongo MD [facility]  If note is complete, please contact covering daytime or nighttime physician. [email or web address] Password [email or web address]  11/28/2016, 12:48 AM

## 2016-11-27 NOTE — ED Triage Notes (Signed)
Pt from home via EMS c/o diarrhea and cough. Pt diarrhea started yesterday. Pt was given Imodium by daughter with no relief. Pt fell and broke ribs 1 weeks ago and started to have a productive cough with yellow sputum for 3 days. Pt adds that she has hemorrhoids that are painful since having diarrhea. Pt is A&O and in NAD

## 2016-11-27 NOTE — ED Provider Notes (Signed)
[facility] DEPT Provider Note   CSN: [id number] Arrival date & time: 11/27/16  1717     History   Chief Complaint Chief Complaint  Patient presents with  . Diarrhea  . Cough    HPI Cathy [name] is a 83 y.o. female.  83 yo F with a chief complaint of diarrhea and cough. Patient has had diarrhea for the past couple days. She denies recent antibiotic use. Describes it is dark. Denies blood in it. Denies fevers or chills. Denies abdominal pain. She feels that it happens continuously. Denies nausea or vomiting.  The patient is also been having a cough. This been going on for the past week or so. She recently fell and broke a few ribs on the right side. She denies fevers or chills denies shortness of breath. Old records were reviewed and the patient was recently on antibiotics back in April for an upper respiratory infection.   The history is provided by the patient.  Diarrhea   This is a new problem. The current episode started 2 days ago. The problem occurs continuously. The problem has not changed since onset.The stool consistency is described as watery. There has been no fever. Associated symptoms include cough. Pertinent negatives include no vomiting, no chills, no headaches, no arthralgias and no myalgias. She has tried nothing for the symptoms. The treatment provided no relief.  Cough  Pertinent negatives include no chest pain, no chills, no headaches, no rhinorrhea, no myalgias, no shortness of breath, no wheezing and no eye redness.    Past Medical History:  Diagnosis Date  . Allergic rhinitis   . Anemia   . Anxiety   . Chronic systolic CHF (congestive heart failure) ([place])    a. 2D Echo 02/02/15: EF 25-30%, akinesis of mid-apical anteroseptal myocardium, grade 3 DD, mod MR, severely dilated LA, mildly reduced RV systoluc function, mod dilated RA, mod TR, mildly increased PASP 33mmHg.  . Colitis    lymphocytic colitis feb 2011  . Coronary artery calcification    a. By  CT 01/2015.  . Diverticulosis of colon   . DJD (degenerative joint disease)    gets epidural injections  . Dysphagia   . Hiatal hernia   . Hypercholesteremia   . Hypertension   . Iron deficiency   . Irritable bowel syndrome   . Liver lesion 02/18/2015  . Lumbar back pain   . Osteoporosis   . Paroxysmal atrial flutter ([place])    a. Dx 01/2015 - underwent DCCV 03/2015; takes Eliquis  . Sinus bradycardia   . Splenic lesion 02/18/2015  . Urinary incontinence   . Venous insufficiency   . Vitamin B12 deficiency     Patient Active Problem List   Diagnosis Date Noted  . Diarrhea 11/27/2016  . Chest pain 11/18/2016  . Skin lesion 09/30/2016  . Ganglion cyst of dorsum of left wrist 09/30/2016  . Itching 09/30/2016  . Acute upper respiratory infection 09/30/2016  . Rotator cuff arthropathy, right 04/26/2016  . Hyperglycemia 04/01/2016  . Right arm pain 04/01/2016  . Wrist swelling 09/30/2015  . Acute URI 06/04/2015  . Encounter for well adult exam with abnormal findings 04/02/2015  . UTI (urinary tract infection) 04/02/2015  . Chronic combined systolic and diastolic CHF (congestive heart failure) ([place]) 03/17/2015  . Coronary artery calcification seen on CT scan 03/17/2015  . Sinus bradycardia 03/17/2015  . Splenic lesion 02/18/2015  . Liver lesion 02/18/2015  . CAD (coronary artery disease), native coronary artery 02/03/2015  . Atrial  flutter ([place]) 02/02/2015  . Fatigue 01/31/2015  . Abnormal CT of the abdomen 01/31/2015  . Prolonged Q-T interval on ECG 01/31/2015  . Hepatic lesion   . Constipation 12/03/2014  . Lower extremity edema 04/25/2014  . Peripheral neuropathy ([place]) 12/10/2013  . Overactive bladder 12/27/2011  . Osteoporosis 12/27/2011  . VITAMIN B12 DEFICIENCY 05/03/2010  . IRON DEFICIENCY 05/03/2010  . Anemia 05/03/2010  . CLOSTRIDIUM DIFFICILE COLITIS 05/26/2009  . VENOUS INSUFFICIENCY 01/27/2009  . BACK PAIN, LUMBAR 01/27/2008  . URINARY INCONTINENCE  08/23/2007  . DIVERTICULOSIS OF COLON 04/10/2007  . HYPERCHOLESTEROLEMIA 02/13/2007  . ANXIETY 02/13/2007  . Essential hypertension 02/13/2007  . HIATAL HERNIA 02/13/2007  . Irritable bowel syndrome 02/13/2007  . DEGENERATIVE JOINT DISEASE 02/13/2007  . Allergic rhinitis 02/13/2007    Past Surgical History:  Procedure Laterality Date  . ABDOMINAL HYSTERECTOMY    . CARDIOVERSION N/A 02/03/2015   Procedure: CARDIOVERSION;  Surgeon: Sueanne Margarita, MD;  Location: [facility] ENDOSCOPY;  Service: Cardiovascular;  Laterality: N/A;  . CARDIOVERSION N/A 03/10/2015   Procedure: CARDIOVERSION;  Surgeon: Dorothy Spark, MD;  Location: [facility] ENDOSCOPY;  Service: Cardiovascular;  Laterality: N/A;  . cataract surgery    . decompressive lumbar laminectomy  06/2008   at L2-3, L3-4 and L4-5 by Dr. Ronnald Ramp  . LAPAROSCOPIC CHOLECYSTECTOMY  04/2009   Dr. Abran Cantor  . REPLACEMENT TOTAL KNEE    . TEE WITHOUT CARDIOVERSION N/A 02/03/2015   Procedure: TRANSESOPHAGEAL ECHOCARDIOGRAM (TEE);  Surgeon: Sueanne Margarita, MD;  Location: [facility] ENDOSCOPY;  Service: Cardiovascular;  Laterality: N/A;    OB History    No data available       Home Medications    Prior to Admission medications   Medication Sig Start Date End Date Taking? Authorizing Provider  amiodarone (PACERONE) 200 MG tablet Take 1 tablet (200 mg total) by mouth daily. 11/09/16  Yes Camnitz, Will Hassell Done, MD  atorvastatin (LIPITOR) 20 MG tablet TAKE 1 TABLET DAILY AT 6 P.M. 10/01/16  Yes Biagio Borg, MD  benzonatate (TESSALON PERLES) 100 MG capsule Take 1 capsule (100 mg total) by mouth 2 (two) times daily as needed for cough. 09/30/16 09/30/17 Yes Biagio Borg, MD  calcium-vitamin D (OSCAL WITH D) 500-200 MG-UNIT per tablet Take 1 tablet by mouth daily.    Yes [provider]  cholecalciferol (VITAMIN D) 1000 UNITS tablet Take 1,000 Units by mouth daily.   Yes [provider]  ELIQUIS 2.5 MG TABS tablet TAKE 1 TABLET TWICE A DAY 08/30/16  Yes  Camnitz, Will Hassell Done, MD  esomeprazole (NEXIUM) 40 MG capsule TAKE 1 CAPSULE DAILY 03/26/16  Yes Biagio Borg, MD  Ferrous Sulfate Dried (FEOSOL) 200 (65 FE) MG TABS Take 1 tablet by mouth 2 (two) times daily. Take with a vitamin c 500mg     Yes [provider]  fexofenadine (ALLEGRA) 180 MG tablet Take 180 mg by mouth daily as needed for allergies or rhinitis.   Yes [provider]  isosorbide mononitrate (IMDUR) 30 MG 24 hr tablet Take 1 tablet (30 mg total) by mouth daily. 09/22/16  Yes Camnitz, Ocie Doyne, MD  oxyCODONE-acetaminophen (PERCOCET) 5-325 MG per tablet Take 1 tablet by mouth every 6 (six) hours as needed for moderate pain.    Yes [provider]  polyethylene glycol (MIRALAX / GLYCOLAX) packet Take 17 g by mouth 2 (two) times daily. Patient taking differently: Take 17 g by mouth daily as needed for mild constipation or moderate constipation.  10/22/14  Yes Palumbo, April, MD  pregabalin (LYRICA) 50 MG capsule Take 1 capsule (50 mg total) by mouth 3 (three) times daily. 07/13/16  Yes Biagio Borg, MD  solifenacin (VESICARE) 10 MG tablet Take 1 tablet (10 mg total) by mouth daily. 06/17/14  Yes Rowe Clack, MD  vitamin B-12 (CYANOCOBALAMIN) 1000 MCG tablet Take 1 tablet (1,000 mcg total) by mouth daily. 06/17/14  Yes Rowe Clack, MD  Vitamin D, Ergocalciferol, (DRISDOL) 50000 units CAPS capsule Take 1 capsule (50,000 Units total) by mouth every 7 (seven) days. Patient not taking: Reported on 11/27/2016 05/27/16   Lyndal Pulley, DO    Family History Family History  Problem Relation Age of Onset  . Heart attack Mother   . Heart attack Brother   . Diabetes Brother   . Hypertension Neg Hx   . Fainting Neg Hx   . Stroke Neg Hx     Social History Social History  Substance Use Topics  . Smoking status: Former Smoker    Packs/day: 0.50    Years: 10.00    Types: Cigarettes  . Smokeless tobacco: Never Used  . Alcohol use No      Allergies   Amoxicillin-pot clavulanate and Sulfonamide derivatives   Review of Systems Review of Systems  Constitutional: Negative for chills and fever.  HENT: Negative for congestion and rhinorrhea.   Eyes: Negative for redness and visual disturbance.  Respiratory: Positive for cough. Negative for shortness of breath and wheezing.   Cardiovascular: Negative for chest pain and palpitations.  Gastrointestinal: Positive for diarrhea. Negative for nausea and vomiting.  Genitourinary: Negative for dysuria and urgency.  Musculoskeletal: Negative for arthralgias and myalgias.  Skin: Negative for pallor and wound.  Neurological: Negative for dizziness and headaches.     Physical Exam Updated Vital Signs BP 132/75   Pulse 68   Temp 98.6 F (37 C) (Oral)   Resp 20   Ht 4\' 9"  (1.448 m)   Wt 57.2 kg (126 lb)   SpO2 93%   BMI 27.27 kg/m   Physical Exam  Constitutional: She is oriented to person, place, and time. She appears well-developed and well-nourished. No distress.  Pallor  HENT:  Head: Normocephalic and atraumatic.  Eyes: Pupils are equal, round, and reactive to light. EOM are normal.  Neck: Normal range of motion. Neck supple.  Cardiovascular: Normal rate and regular rhythm.  Exam reveals no gallop and no friction rub.   No murmur heard. Pulmonary/Chest: Effort normal. She has no wheezes. She has no rales.  Abdominal: Soft. She exhibits no distension and no mass. There is no tenderness. There is no guarding.  Musculoskeletal: She exhibits tenderness. She exhibits no edema.  Bruising noted to the right breast in the right lateral rib margin.  Neurological: She is alert and oriented to person, place, and time.  Skin: Skin is warm and dry. She is not diaphoretic.  Psychiatric: She has a normal mood and affect. Her behavior is normal.  Nursing note and vitals reviewed.    ED Treatments / Results  Labs (all labs ordered are listed, but only abnormal results are  displayed) Labs Reviewed  CBC WITH DIFFERENTIAL/PLATELET - Abnormal; Notable for the following:       Result Value   WBC 18.6 (*)    Neutro Abs 16.0 (*)    Monocytes Absolute 1.8 (*)    All other components within normal limits  COMPREHENSIVE METABOLIC PANEL - Abnormal; Notable for the following:    Calcium  8.6 (*)    Total Protein 5.9 (*)    Albumin 3.1 (*)    All other components within normal limits  I-STAT CHEM 8, ED - Abnormal; Notable for the following:    Calcium, Ion 1.05 (*)    Hemoglobin 10.5 (*)    HCT 31.0 (*)    All other components within normal limits  POC OCCULT BLOOD, ED - Abnormal; Notable for the following:    Fecal Occult Bld POSITIVE (*)    All other components within normal limits  C DIFFICILE QUICK SCREEN W PCR REFLEX  GASTROINTESTINAL PANEL BY PCR, STOOL (REPLACES STOOL CULTURE)  LIPASE, BLOOD  BRAIN NATRIURETIC PEPTIDE  TYPE AND SCREEN    EKG  EKG Interpretation  Date/Time:  Saturday November 27 2016 18:11:52 EDT Ventricular Rate:  60 PR Interval:    QRS Duration: 147 QT Interval:  441 QTC Calculation: 441 R Axis:   -111 Text Interpretation:  Right and left arm electrode reversal, interpretation assumes no reversal Atrial fibrillation Nonspecific IVCD with LAD Borderline T abnormalities, lateral leads TECHNICALLY DIFFICULT Baseline wander Otherwise no significant change Confirmed by Deno Etienne [phone]) on 11/27/2016 6:46:49 PM       Radiology Dg Chest 2 View  Result Date: 11/27/2016 CLINICAL DATA:  Productive cough x1 week with right-sided rib fracture that occurred last week. EXAM: CHEST  2 VIEW COMPARISON:  11/18/2016 FINDINGS: Low lung volumes with interval increase in pulmonary vascular congestion and right effusion. Large hiatal hernia projects over the heart. Aortic atherosclerosis without aneurysm. Osteoarthritis of the AC glenohumeral joints bilaterally. Thoracic spondylosis. No acute displaced rib fracture is apparent on current exam  provided. IMPRESSION: 1. Low lung volumes are again noted with large hiatal hernia. 2. Interval increase in pulmonary vascular congestion and small right effusion. Findings likely represent mild CHF. 3. Aortic atherosclerosis. Electronically Signed   By: Ashley Royalty M.D.   On: 11/27/2016 20:00   Ct Abdomen Pelvis W Contrast  Result Date: 11/27/2016 CLINICAL DATA:  Abdominal pain and diarrhea. Fall 1 week ago with broken ribs. EXAM: CT ABDOMEN AND PELVIS WITH CONTRAST TECHNIQUE: Multidetector CT imaging of the abdomen and pelvis was performed using the standard protocol following bolus administration of intravenous contrast. CONTRAST:  80 cc Isovue-300 IV COMPARISON:  Most recent CT 01/31/2015 FINDINGS: Lower chest: Moderate right pleural effusion measuring simple fluid density. Mrs. partially loculated. Adjacent atelectasis in the right lower lobe. Hiatal hernia likely causes mass effect on the bronchus intermedius with right middle lobe atelectasis. Trace left pleural effusion and adjacent atelectasis. Fractures of anterior right sixth and seventh ribs. Hepatobiliary: No evidence of hepatic injury. Enhancing lesion adjacent with falciform ligament is not as well visualized on the current exam due to phase of contrast but grossly similar measuring 2.7 x 1.4 cm. Postcholecystectomy with stable extrahepatic and intrahepatic (left greater than right) biliary dilatation. Common bile duct measures 14 mm in mid distal portion. Pancreas: No ductal dilatation or inflammation. Spleen: Multiple low-density lesions throughout the spleen, with mild progression from prior exam. Largest lesion measures 3.2 x 2.9 cm, previously 2.8 x 2.7 cm. No perisplenic free fluid. Adrenals/Urinary Tract: Heterogeneous left adrenal mass measures 3.2 x 2.2 cm, unchanged from prior. No right adrenal nodule. No hydronephrosis or perinephric edema. Small cyst in the posterior left kidney is unchanged from prior. Homogeneous renal enhancement  with symmetric excretion on delayed phase imaging. Urinary bladder is distended without wall thickening. Stomach/Bowel: Lack of enteric contrast limits assessment. Large hiatal hernia contains majority  of the stomach as well as transverse colon. No evidence of gastric inflammation or abnormal distention. Small bowel is decompressed. Moderate stool in the ascending, transverse, and proximal descending colon, portions of transverse colon extend into hiatal hernia without wall thickening or obstruction. More distal descending and sigmoid colon are decompressed. Significant rectal distention with stool with wall thickening and perirectal soft tissue edema and fluid. Rectal distention of 7.5 x 6.7 cm. No liquid stool or colonic inflammation. No definite pneumatosis or evidence perforation. Vascular/Lymphatic: Aortic and branch atherosclerosis. Aortic tortuosity. No aneurysm. Limited assessment for adenopathy given lack of enteric contrast and paucity of intra-abdominal fat. Reproductive: Status post hysterectomy. No adnexal masses. Other: Small amount of free fluid in the pelvis. No free air or intra-abdominal abscess. Mild mesenteric and body wall edema suggest third-spacing. Musculoskeletal: Scoliosis and multilevel degenerative change in the spine. No suspicious osseous abnormality. Acute fractures of right anterior sixth and seventh ribs. IMPRESSION: 1. Findings suspicious for fecal impaction and stercoral proctitis with stool ball distending the rectum, rectal wall thickening and perirectal edema/fluid. No colonic inflammation to suggest colitis. No bowel obstruction. 2. Large hiatal/paraesophageal hernia containing majority of the stomach in transverse colon, no obstruction. 3. Innumerable hypodense lesions scattered throughout the spleen, with only mild increase from exam 20 months prior, may be post infectious, inflammatory or neoplastic process. If neoplastic would be indolent given relative growth. 4. Left  adrenal mass and enhancing liver lesion are unchanged. 5. Unchanged biliary dilatation postcholecystectomy. 6. Moderate right and small left pleural effusion, measuring simple fluid density, no hemothorax. Mesenteric and body wall edema. Overall findings suggest third-spacing/fluid overload. 7. Acute right anterior sixth and seventh rib fractures without associated pneumothorax or gross pulmonary contusion. Electronically Signed   By: Jeb Levering M.D.   On: 11/27/2016 22:21    Procedures Procedures (including critical care time)  Medications Ordered in ED Medications  sodium chloride 0.9 % bolus 1,000 mL (0 mLs Intravenous Stopped 11/27/16 2234)  iopamidol (ISOVUE-300) 61 % injection (80 mLs Intravenous Contrast Given 11/27/16 2140)     Initial Impression / Assessment and Plan / ED Course  I have reviewed the triage vital signs and the nursing notes.  Pertinent labs & imaging results that were available during my care of the patient were reviewed by me and considered in my medical decision making (see chart for details).     83 yo F With a chief complaint of diarrhea and cough. Has the patient recently had retractions on the right will obtain a chest x-ray to evaluate for possible pneumonia. Patient recently did have antibiotic use, will test stool.    Check labs give fluids.   Concern for pna post rib fx with pleural effusion on the side of pain.  Also with + blood on stool, constant diarrhea, concern for infectious diarrhea.   Discussed with hospitalist, will admit.   The patients results and plan were reviewed and discussed.   Any x-rays performed were independently reviewed by myself.   Differential diagnosis were considered with the presenting HPI.  Medications  sodium chloride 0.9 % bolus 1,000 mL (0 mLs Intravenous Stopped 11/27/16 2234)  iopamidol (ISOVUE-300) 61 % injection (80 mLs Intravenous Contrast Given 11/27/16 2140)    Vitals:   11/27/16 1830 11/27/16 1930  11/27/16 2030 11/27/16 2130  BP: (!) 164/82 (!) 153/72 (!) 164/69 132/75  Pulse:  (!) 53 61 68  Resp: (!) 21 (!) 22 (!) 22 20  Temp:      TempSrc:  SpO2:  95% 94% 93%  Weight:      Height:        Final diagnoses:  Infectious diarrhea    Admission/ observation were discussed with the admitting physician, patient and/or family and they are comfortable with the plan.    Final Clinical Impressions(s) / ED Diagnoses   Final diagnoses:  Infectious diarrhea    New Prescriptions New Prescriptions   No medications on file     Deno Etienne, DO 11/27/16 2328

## 2016-11-27 NOTE — ED Notes (Signed)
Pt will not allow jacket to be taken off.

## 2016-11-28 ENCOUNTER — Inpatient Hospital Stay (HOSPITAL_COMMUNITY): Payer: Medicare Other

## 2016-11-28 DIAGNOSIS — S2249XA Multiple fractures of ribs, unspecified side, initial encounter for closed fracture: Secondary | ICD-10-CM | POA: Diagnosis present

## 2016-11-28 DIAGNOSIS — I509 Heart failure, unspecified: Secondary | ICD-10-CM

## 2016-11-28 DIAGNOSIS — S2239XA Fracture of one rib, unspecified side, initial encounter for closed fracture: Secondary | ICD-10-CM | POA: Diagnosis present

## 2016-11-28 LAB — GASTROINTESTINAL PANEL BY PCR, STOOL (REPLACES STOOL CULTURE)
Adenovirus F40/41: NOT DETECTED
Astrovirus: NOT DETECTED
CAMPYLOBACTER SPECIES: NOT DETECTED
CRYPTOSPORIDIUM: NOT DETECTED
CYCLOSPORA CAYETANENSIS: NOT DETECTED
ENTAMOEBA HISTOLYTICA: NOT DETECTED
ENTEROTOXIGENIC E COLI (ETEC): NOT DETECTED
Enteroaggregative E coli (EAEC): NOT DETECTED
Enteropathogenic E coli (EPEC): NOT DETECTED
Giardia lamblia: NOT DETECTED
Norovirus GI/GII: NOT DETECTED
PLESIMONAS SHIGELLOIDES: NOT DETECTED
Rotavirus A: NOT DETECTED
SALMONELLA SPECIES: NOT DETECTED
SAPOVIRUS (I, II, IV, AND V): NOT DETECTED
SHIGA LIKE TOXIN PRODUCING E COLI (STEC): NOT DETECTED
Shigella/Enteroinvasive E coli (EIEC): NOT DETECTED
VIBRIO CHOLERAE: NOT DETECTED
Vibrio species: NOT DETECTED
YERSINIA ENTEROCOLITICA: NOT DETECTED

## 2016-11-28 LAB — BASIC METABOLIC PANEL
ANION GAP: 9 (ref 5–15)
BUN: 13 mg/dL (ref 6–20)
CO2: 27 mmol/L (ref 22–32)
Calcium: 8.9 mg/dL (ref 8.9–10.3)
Chloride: 102 mmol/L (ref 101–111)
Creatinine, Ser: 0.79 mg/dL (ref 0.44–1.00)
GFR calc Af Amer: >60 mL/min (ref >60)
GLUCOSE: 112 mg/dL — AB (ref 65–99)
POTASSIUM: 3.8 mmol/L (ref 3.5–5.1)
Sodium: 138 mmol/L (ref 135–145)

## 2016-11-28 LAB — CBC
HEMATOCRIT: 39 % (ref 36.0–46.0)
HEMOGLOBIN: 12.9 g/dL (ref 12.0–15.0)
MCH: 30.5 pg (ref 26.0–34.0)
MCHC: 33.1 g/dL (ref 30.0–36.0)
MCV: 92.2 fL (ref 78.0–100.0)
Platelets: 200 10*3/uL (ref 150–400)
RBC: 4.23 MIL/uL (ref 3.87–5.11)
RDW: 14.1 % (ref 11.5–15.5)
WBC: 18.9 10*3/uL — ABNORMAL HIGH (ref 4.0–10.5)

## 2016-11-28 LAB — ECHOCARDIOGRAM COMPLETE
HEIGHTINCHES: 57 in
Weight: 2016 oz

## 2016-11-28 LAB — BRAIN NATRIURETIC PEPTIDE: B Natriuretic Peptide: 348.3 pg/mL — ABNORMAL HIGH (ref 0.0–100.0)

## 2016-11-28 MED ORDER — PREGABALIN 50 MG PO CAPS
50.0000 mg | ORAL_CAPSULE | Freq: Three times a day (TID) | ORAL | Status: DC
  Administered 2016-11-28 – 2016-12-03 (×16): 50 mg via ORAL
  Filled 2016-11-28 (×16): qty 1

## 2016-11-28 MED ORDER — LIDOCAINE 5 % EX PTCH
1.0000 | MEDICATED_PATCH | CUTANEOUS | Status: DC
  Administered 2016-11-28 – 2016-12-03 (×6): 1 via TRANSDERMAL
  Filled 2016-11-28 (×6): qty 1

## 2016-11-28 MED ORDER — FUROSEMIDE 10 MG/ML IJ SOLN
40.0000 mg | Freq: Once | INTRAMUSCULAR | Status: AC
  Administered 2016-11-28: 40 mg via INTRAVENOUS
  Filled 2016-11-28: qty 4

## 2016-11-28 MED ORDER — ONDANSETRON HCL 4 MG/2ML IJ SOLN: 4.0000 mg | Freq: Four times a day (QID) | INTRAMUSCULAR | Status: DC | PRN

## 2016-11-28 MED ORDER — MORPHINE SULFATE (PF) 2 MG/ML IV SOLN
2.0000 mg | INTRAVENOUS | Status: DC | PRN
  Administered 2016-11-28: 2 mg via INTRAVENOUS
  Filled 2016-11-28: qty 1

## 2016-11-28 MED ORDER — ONDANSETRON HCL 4 MG PO TABS: 4.0000 mg | ORAL_TABLET | Freq: Four times a day (QID) | ORAL | Status: DC | PRN

## 2016-11-28 MED ORDER — METRONIDAZOLE IN NACL 5-0.79 MG/ML-% IV SOLN
500.0000 mg | Freq: Three times a day (TID) | INTRAVENOUS | Status: DC
  Administered 2016-11-28 – 2016-11-29 (×5): 500 mg via INTRAVENOUS
  Filled 2016-11-28 (×7): qty 100

## 2016-11-28 MED ORDER — LISINOPRIL 5 MG PO TABS
2.5000 mg | ORAL_TABLET | Freq: Every day | ORAL | Status: DC
  Administered 2016-11-28: 2.5 mg via ORAL
  Filled 2016-11-28: qty 1

## 2016-11-28 MED ORDER — PANTOPRAZOLE SODIUM 40 MG PO TBEC
40.0000 mg | DELAYED_RELEASE_TABLET | Freq: Every day | ORAL | Status: DC
  Administered 2016-11-28 – 2016-12-03 (×6): 40 mg via ORAL
  Filled 2016-11-28 (×6): qty 1

## 2016-11-28 MED ORDER — ISOSORBIDE MONONITRATE ER 30 MG PO TB24
30.0000 mg | ORAL_TABLET | Freq: Every day | ORAL | Status: DC
  Administered 2016-11-28 – 2016-12-03 (×6): 30 mg via ORAL
  Filled 2016-11-28 (×6): qty 1

## 2016-11-28 MED ORDER — OXYCODONE-ACETAMINOPHEN 5-325 MG PO TABS
1.0000 | ORAL_TABLET | Freq: Four times a day (QID) | ORAL | Status: DC | PRN
  Administered 2016-11-28: 1 via ORAL
  Filled 2016-11-28: qty 1

## 2016-11-28 MED ORDER — SORBITOL 70 % SOLN
960.0000 mL | TOPICAL_OIL | Freq: Once | ORAL | Status: AC
  Administered 2016-11-28: 960 mL via RECTAL
  Filled 2016-11-28: qty 240

## 2016-11-28 MED ORDER — ATORVASTATIN CALCIUM 10 MG PO TABS
20.0000 mg | ORAL_TABLET | Freq: Every day | ORAL | Status: DC
  Administered 2016-11-28: 20 mg via ORAL
  Filled 2016-11-28: qty 2
  Filled 2016-11-28: qty 1

## 2016-11-28 MED ORDER — AMIODARONE HCL 200 MG PO TABS
200.0000 mg | ORAL_TABLET | Freq: Every day | ORAL | Status: DC
  Administered 2016-11-28: 200 mg via ORAL
  Filled 2016-11-28 (×2): qty 1

## 2016-11-28 MED ORDER — ACETAMINOPHEN 650 MG RE SUPP
650.0000 mg | Freq: Four times a day (QID) | RECTAL | Status: DC | PRN
Start: 2016-11-28 — End: 2016-11-29

## 2016-11-28 MED ORDER — CIPROFLOXACIN IN D5W 400 MG/200ML IV SOLN
400.0000 mg | Freq: Two times a day (BID) | INTRAVENOUS | Status: DC
  Administered 2016-11-28 – 2016-11-29 (×4): 400 mg via INTRAVENOUS
  Filled 2016-11-28 (×4): qty 200

## 2016-11-28 MED ORDER — ACETAMINOPHEN 325 MG PO TABS: 650.0000 mg | ORAL_TABLET | Freq: Four times a day (QID) | ORAL | Status: DC | PRN

## 2016-11-28 MED ORDER — DARIFENACIN HYDROBROMIDE ER 15 MG PO TB24
15.0000 mg | ORAL_TABLET | Freq: Every day | ORAL | Status: DC
Start: 2016-11-28 — End: 2016-12-03
  Administered 2016-11-28 – 2016-12-03 (×6): 15 mg via ORAL
  Filled 2016-11-28 (×6): qty 1

## 2016-11-28 MED ORDER — APIXABAN 2.5 MG PO TABS
2.5000 mg | ORAL_TABLET | Freq: Two times a day (BID) | ORAL | Status: DC
  Administered 2016-11-28 – 2016-12-03 (×11): 2.5 mg via ORAL
  Filled 2016-11-28 (×11): qty 1

## 2016-11-28 NOTE — Progress Notes (Signed)
[facility] Progress Note  Patient: Cathy [name] CZY:[id number]   PCP: Biagio Borg, MD DOB: 1927-07-07   DOA: 11/27/2016   DOS: 11/28/2016   Date of Service: the patient was seen and examined on 11/28/2016  Subjective: Complains about abdominal pain as well as diarrhea. No nausea no vomiting no shortness of breath. No cough. No chest pain.  Brief hospital course: Pt. with PMH of A. fib on epic [place], HTN, HIV, chronic diastolic CHF, anxiety; admitted on 11/27/2016, presented with complaint of diarrhea, was found to have constipation and located pleural effusion. Currently further plan is continue workup.  Assessment and Plan: Diarrhea Proctitis History of microscopic colitis. -Patient thought to be impacted, took 2 Miralax and has had nonstop diarrhea since -Initial concern was for infectious colitis but she was C diff negative -CT appears to indicate impaction with overflow incontinence -Will request enema -While she is heme positive, her Hgb is normal - will trend and see if disimpaction clears the obstruction and the colonic irritant -Will continue Cipro/Flagyl for now given elevated WBC count and uncertain diagnosis, but possibly able to d/c antibiotics as early as tomorrow  Chronic diastolic CHF Right-sided pleural effusion Acute hypoxic failure -Echo in 6/17 showed preserved EF with grade 2 diastolic dysfunction; she had a depressed EF in 2016 -Patient presenting with cough that worsens at night without significant LE edema -Initial concern for PNA but CXR more consistent with pulmonary edema and CT appears to agree -Elevated WBC count, but this may be reactive in nature or could indicate acute infection -BNP 348.3; previously 421.8 in 12/16 when hospitalized with acute heart failure -Patient with known afib, Rate controlled  -While PNA remains a consideration (and patient is on Cipro/Flagyl for now), CHF appears more likely as the cause for her cough -Will admit with  telemetry Echocardiogram shows preserved EF, no significant wall motion abnormalities. No significant valvular abnormality as well. Shows grade 3 diastolic dysfunction. -Will start Lisinopril 2.5 mg daily (BP is normal to high and so should support addition of medication) -No beta blocker due to acute decompensation on presentation Patient was given 40 of Lasix times one, I would hold off further diuresis for now. Right-sided pleural effusion is also reported to be loculated. I would actually acutely treat her presenting complaints of diarrhea and colitis and once stable will get an x-ray decubitus and if still shows loculated pleural effusion with thoracentesis.  Rib fractures -Pain control adequate but not ideal with Percocet -Will add Lidocaine patch -Incentive spirometry -PT/OT consults  Aflutter -Rate controlled, on Amiodarone  -On Eliquis -If ongoing concern for GI bleeding, may need to stop/hold Eliquis  Diet: Clear liquid diet, will advance to full liquid DVT Prophylaxis: on therapeutic anticoagulation.  Advance goals of care discussion: DNR DNI  Family Communication: family was present at bedside, at the time of interview. The pt provided permission to discuss medical plan with the family. Opportunity was given to ask question and all questions were answered satisfactorily.   Disposition:  Discharge to be determined.  Consultants: none Procedures: none  Antibiotics: Anti-infectives    Start     Dose/Rate Route Frequency Ordered Stop   11/28/16 0100  ciprofloxacin (CIPRO) IVPB 400 mg     400 mg 200 mL/hr over 60 Minutes Intravenous 2 times daily 11/28/16 0049     11/28/16 0100  metroNIDAZOLE (FLAGYL) IVPB 500 mg     500 mg 100 mL/hr over 60 Minutes Intravenous Every 8 hours 11/28/16 0049  Objective: Physical Exam: Vitals:   11/28/16 1030 11/28/16 1236 11/28/16 1400 11/28/16 1542  BP: (!) 116/97 (!) 108/58 (!) 102/56 (!) 111/57  Pulse: 65 67 61 64    Resp: 18 16 20 20   Temp:    98.2 F (36.8 C)  TempSrc:    Oral  SpO2: 94% 94% 95% 97%  Weight:    60.3 kg (132 lb 15 oz)  Height:        Intake/Output Summary (Last 24 hours) at 11/28/16 1627 Last data filed at 11/28/16 0225  Gross per 24 hour  Intake              300 ml  Output                0 ml  Net              300 ml   Filed Weights   11/27/16 1757 11/28/16 1542  Weight: 57.2 kg (126 lb) 60.3 kg (132 lb 15 oz)   General: Alert, Awake and Oriented to Time, Place and Person. Appear in moderate distress, affect appropriate Eyes: PERRL, Conjunctiva normal ENT: Oral Mucosa clear moist. Neck: difficult to assess JVD, jno Abnormal Mass Or lumps Cardiovascular: S1 and S2 Present, aortic systolic Murmur, Peripheral Pulses Present Respiratory: normal respiratory effort, Bilateral Air entry equal and Decreased, no use of accessory muscle, basal Crackles, no wheezes Abdomen: Bowel Sound present, Soft and mild tenderness, no hernia Skin: no redness, no Rash, no induration Extremities: no Pedal edema, no calf tenderness Neurologic: Grossly no focal neuro deficit. Bilaterally Equal motor strength  Data Reviewed: CBC:  Recent Labs Lab 11/27/16 1851 11/27/16 1906 11/28/16 0458  WBC 18.6*  --  18.9*  NEUTROABS 16.0*  --   --   HGB 12.2 10.5* 12.9  HCT 37.6 31.0* 39.0  MCV 93.5  --  92.2  PLT 190  --  621   Basic Metabolic Panel:  Recent Labs Lab 11/27/16 1851 11/27/16 1906 11/28/16 0458  NA 138 141 138  K 4.5 3.9 3.8  CL 104 104 102  CO2 28  --  27  GLUCOSE 90 79 112*  BUN 14 14 13   CREATININE 0.81 0.70 0.79  CALCIUM 8.6*  --  8.9    Liver Function Tests:  Recent Labs Lab 11/27/16 1851  AST 32  ALT 35  ALKPHOS 94  BILITOT 0.9  PROT 5.9*  ALBUMIN 3.1*    Recent Labs Lab 11/27/16 1851  LIPASE 20   No results for input(s): AMMONIA in the last 168 hours. Coagulation Profile: No results for input(s): INR, PROTIME in the last 168 hours. Cardiac  Enzymes: No results for input(s): CKTOTAL, CKMB, CKMBINDEX, TROPONINI in the last 168 hours. BNP (last 3 results) No results for input(s): PROBNP in the last 8760 hours. CBG: No results for input(s): GLUCAP in the last 168 hours. Studies: Dg Chest 2 View  Result Date: 11/27/2016 CLINICAL DATA:  Productive cough x1 week with right-sided rib fracture that occurred last week. EXAM: CHEST  2 VIEW COMPARISON:  11/18/2016 FINDINGS: Low lung volumes with interval increase in pulmonary vascular congestion and right effusion. Large hiatal hernia projects over the heart. Aortic atherosclerosis without aneurysm. Osteoarthritis of the AC glenohumeral joints bilaterally. Thoracic spondylosis. No acute displaced rib fracture is apparent on current exam provided. IMPRESSION: 1. Low lung volumes are again noted with large hiatal hernia. 2. Interval increase in pulmonary vascular congestion and small right effusion. Findings likely represent mild CHF. 3.  Aortic atherosclerosis. Electronically Signed   By: Ashley Royalty M.D.   On: 11/27/2016 20:00   Ct Abdomen Pelvis W Contrast  Result Date: 11/27/2016 CLINICAL DATA:  Abdominal pain and diarrhea. Fall 1 week ago with broken ribs. EXAM: CT ABDOMEN AND PELVIS WITH CONTRAST TECHNIQUE: Multidetector CT imaging of the abdomen and pelvis was performed using the standard protocol following bolus administration of intravenous contrast. CONTRAST:  80 cc Isovue-300 IV COMPARISON:  Most recent CT 01/31/2015 FINDINGS: Lower chest: Moderate right pleural effusion measuring simple fluid density. Mrs. partially loculated. Adjacent atelectasis in the right lower lobe. Hiatal hernia likely causes mass effect on the bronchus intermedius with right middle lobe atelectasis. Trace left pleural effusion and adjacent atelectasis. Fractures of anterior right sixth and seventh ribs. Hepatobiliary: No evidence of hepatic injury. Enhancing lesion adjacent with falciform ligament is not as well  visualized on the current exam due to phase of contrast but grossly similar measuring 2.7 x 1.4 cm. Postcholecystectomy with stable extrahepatic and intrahepatic (left greater than right) biliary dilatation. Common bile duct measures 14 mm in mid distal portion. Pancreas: No ductal dilatation or inflammation. Spleen: Multiple low-density lesions throughout the spleen, with mild progression from prior exam. Largest lesion measures 3.2 x 2.9 cm, previously 2.8 x 2.7 cm. No perisplenic free fluid. Adrenals/Urinary Tract: Heterogeneous left adrenal mass measures 3.2 x 2.2 cm, unchanged from prior. No right adrenal nodule. No hydronephrosis or perinephric edema. Small cyst in the posterior left kidney is unchanged from prior. Homogeneous renal enhancement with symmetric excretion on delayed phase imaging. Urinary bladder is distended without wall thickening. Stomach/Bowel: Lack of enteric contrast limits assessment. Large hiatal hernia contains majority of the stomach as well as transverse colon. No evidence of gastric inflammation or abnormal distention. Small bowel is decompressed. Moderate stool in the ascending, transverse, and proximal descending colon, portions of transverse colon extend into hiatal hernia without wall thickening or obstruction. More distal descending and sigmoid colon are decompressed. Significant rectal distention with stool with wall thickening and perirectal soft tissue edema and fluid. Rectal distention of 7.5 x 6.7 cm. No liquid stool or colonic inflammation. No definite pneumatosis or evidence perforation. Vascular/Lymphatic: Aortic and branch atherosclerosis. Aortic tortuosity. No aneurysm. Limited assessment for adenopathy given lack of enteric contrast and paucity of intra-abdominal fat. Reproductive: Status post hysterectomy. No adnexal masses. Other: Small amount of free fluid in the pelvis. No free air or intra-abdominal abscess. Mild mesenteric and body wall edema suggest  third-spacing. Musculoskeletal: Scoliosis and multilevel degenerative change in the spine. No suspicious osseous abnormality. Acute fractures of right anterior sixth and seventh ribs. IMPRESSION: 1. Findings suspicious for fecal impaction and stercoral proctitis with stool ball distending the rectum, rectal wall thickening and perirectal edema/fluid. No colonic inflammation to suggest colitis. No bowel obstruction. 2. Large hiatal/paraesophageal hernia containing majority of the stomach in transverse colon, no obstruction. 3. Innumerable hypodense lesions scattered throughout the spleen, with only mild increase from exam 20 months prior, may be post infectious, inflammatory or neoplastic process. If neoplastic would be indolent given relative growth. 4. Left adrenal mass and enhancing liver lesion are unchanged. 5. Unchanged biliary dilatation postcholecystectomy. 6. Moderate right and small left pleural effusion, measuring simple fluid density, no hemothorax. Mesenteric and body wall edema. Overall findings suggest third-spacing/fluid overload. 7. Acute right anterior sixth and seventh rib fractures without associated pneumothorax or gross pulmonary contusion. Electronically Signed   By: Jeb Levering M.D.   On: 11/27/2016 22:21    Scheduled Meds: .  amiodarone  200 mg Oral Daily  . apixaban  2.5 mg Oral BID  . atorvastatin  20 mg Oral q1800  . darifenacin  15 mg Oral Daily  . isosorbide mononitrate  30 mg Oral Daily  . lidocaine  1 patch Transdermal Q24H  . lisinopril  2.5 mg Oral Daily  . pantoprazole  40 mg Oral Daily  . pregabalin  50 mg Oral TID  . sorbitol, milk of mag, mineral oil, glycerin (SMOG) enema  960 mL Rectal Once   Continuous Infusions: . ciprofloxacin Stopped (11/28/16 1047)  . metronidazole Stopped (11/28/16 0659)   PRN Meds: acetaminophen **OR** acetaminophen, morphine injection, ondansetron **OR** ondansetron (ZOFRAN) IV, oxyCODONE-acetaminophen  Time spent: 35  minutes  Author: Berle Mull, MD [facility] Pager: [phone] 11/28/2016 4:27 PM  If 7PM-7AM, please contact night-coverage at [email or web address], password [email or web address]

## 2016-11-28 NOTE — ED Notes (Signed)
O2 sats consistently staying around 90% on RA.  Placed on 2L/[place]

## 2016-11-28 NOTE — Progress Notes (Signed)
  Echocardiogram 2D Echocardiogram has been performed.  Darlina Sicilian M 11/28/2016, 11:50 AM

## 2016-11-28 NOTE — ED Notes (Signed)
Bed: WA24 Expected date:  Expected time:  Means of arrival:  Comments: 

## 2016-11-28 NOTE — ED Notes (Signed)
Pt was taken to the 3rd floor for admission but the room type had been changed so the pt was taken back to the ED, placed back on monitor and assisted in bed for comfort.

## 2016-11-28 NOTE — ED Notes (Signed)
Pt has fallen asleep after having said a few times earlier that she would not be able to fall asleep.  She appears comfortable and free of distress.  O2 sats range from 84-93% while she is sleeping.  Will continue to monitor.

## 2016-11-29 ENCOUNTER — Inpatient Hospital Stay (HOSPITAL_COMMUNITY): Payer: Medicare Other

## 2016-11-29 LAB — CBC WITH DIFFERENTIAL/PLATELET
Basophils Absolute: 0 10*3/uL (ref 0.0–0.1)
Basophils Relative: 0 %
EOS ABS: 0.1 10*3/uL (ref 0.0–0.7)
EOS PCT: 1 %
HCT: 34.2 % — ABNORMAL LOW (ref 36.0–46.0)
Hemoglobin: 11.2 g/dL — ABNORMAL LOW (ref 12.0–15.0)
LYMPHS ABS: 1.1 10*3/uL (ref 0.7–4.0)
Lymphocytes Relative: 8 %
MCH: 29.9 pg (ref 26.0–34.0)
MCHC: 32.7 g/dL (ref 30.0–36.0)
MCV: 91.2 fL (ref 78.0–100.0)
MONO ABS: 1.6 10*3/uL — AB (ref 0.1–1.0)
MONOS PCT: 12 %
Neutro Abs: 11 10*3/uL — ABNORMAL HIGH (ref 1.7–7.7)
Neutrophils Relative %: 79 %
PLATELETS: 200 10*3/uL (ref 150–400)
RBC: 3.75 MIL/uL — AB (ref 3.87–5.11)
RDW: 14.1 % (ref 11.5–15.5)
WBC: 13.8 10*3/uL — AB (ref 4.0–10.5)

## 2016-11-29 LAB — COMPREHENSIVE METABOLIC PANEL
ALT: 282 U/L — ABNORMAL HIGH (ref 14–54)
ANION GAP: 10 (ref 5–15)
AST: 233 U/L — ABNORMAL HIGH (ref 15–41)
Albumin: 2.8 g/dL — ABNORMAL LOW (ref 3.5–5.0)
Alkaline Phosphatase: 228 U/L — ABNORMAL HIGH (ref 38–126)
BUN: 16 mg/dL (ref 6–20)
CHLORIDE: 102 mmol/L (ref 101–111)
CO2: 28 mmol/L (ref 22–32)
Calcium: 8.7 mg/dL — ABNORMAL LOW (ref 8.9–10.3)
Creatinine, Ser: 0.82 mg/dL (ref 0.44–1.00)
GFR calc non Af Amer: >60 mL/min (ref >60)
Glucose, Bld: 80 mg/dL (ref 65–99)
POTASSIUM: 3.6 mmol/L (ref 3.5–5.1)
SODIUM: 140 mmol/L (ref 135–145)
Total Bilirubin: 0.7 mg/dL (ref 0.3–1.2)
Total Protein: 5.1 g/dL — ABNORMAL LOW (ref 6.5–8.1)

## 2016-11-29 LAB — MAGNESIUM: MAGNESIUM: 1.9 mg/dL (ref 1.7–2.4)

## 2016-11-29 LAB — PROTIME-INR
INR: 1.54
PROTHROMBIN TIME: 18.3 s — AB (ref 11.4–15.2)

## 2016-11-29 LAB — LACTIC ACID, PLASMA: LACTIC ACID, VENOUS: 0.8 mmol/L (ref 0.5–1.9)

## 2016-11-29 MED ORDER — GUAIFENESIN-DM 100-10 MG/5ML PO SYRP
10.0000 mL | ORAL_SOLUTION | ORAL | Status: DC | PRN
  Administered 2016-11-30 – 2016-12-03 (×4): 10 mL via ORAL
  Filled 2016-11-29 (×4): qty 10

## 2016-11-29 MED ORDER — HYDROCORTISONE 1 % EX CREA
TOPICAL_CREAM | Freq: Two times a day (BID) | CUTANEOUS | Status: DC
  Administered 2016-11-29 – 2016-12-03 (×6): via TOPICAL
  Filled 2016-11-29: qty 28

## 2016-11-29 NOTE — Evaluation (Signed)
Occupational Therapy Evaluation Patient Details Name: Cathy [name] MRN: [id number] DOB: 1927-03-29 Today's Date: 11/29/2016    History of Present Illness  Cathy [name] is a 83 y.o. female with medical history significant of afib on Eliquis; HTN; HLD; systolic CHF; anxiety; and arthritis presenting with diarrhea starting 9/28.  recent fall resulting in  fracture of 2 ribs.     Clinical Impression   Pt admitted with above. She demonstrates the below listed deficits and will benefit from continued OT to maximize safety and independence with BADLs.  Pt presents to OT with generalized weakness, decreased activity tolerance, impaired balance, pain.  She requires min guard - mod A for ADLs and min guard assist for functional transfers.  She lived alone and was mod I with ADLs PTA.  She plans to stay with her daughter or daughter in law at discharge who will provide assist as needed.  Will follow.       Follow Up Recommendations  Home health OT;Supervision/Assistance - 24 hour    Equipment Recommendations  Tub/shower seat    Recommendations for Other Services       Precautions / Restrictions Precautions Precautions: Fall Restrictions Weight Bearing Restrictions: No      Mobility Bed Mobility Overal bed mobility: Needs Assistance Bed Mobility: Supine to Sit     Supine to sit: Min assist;Min guard     General bed mobility comments: Pt sitting up in chair   Transfers Overall transfer level: Needs assistance Equipment used: Rolling walker (2 wheeled) Transfers: Sit to/from [facility] Sit to Stand: Min guard Stand pivot transfers: Min guard       General transfer comment: mod verbal cues for hand placement and to initiate task     Balance Overall balance assessment: Needs assistance Sitting-balance support: Feet supported Sitting balance-Leahy Scale: Fair     Standing balance support: Bilateral upper extremity supported;Single extremity supported;During  functional activity Standing balance-Leahy Scale: Fair Standing balance comment: pt assisted with peri-care when min/guard for balance                           ADL either performed or assessed with clinical judgement   ADL Overall ADL's : Needs assistance/impaired Eating/Feeding: Independent   Grooming: Wash/dry hands;Wash/dry face;Oral care;Brushing hair;Min guard;Standing   Upper Body Bathing: Set up;Sitting   Lower Body Bathing: Minimal assistance;Sit to/from stand   Upper Body Dressing : Set up;Sitting   Lower Body Dressing: Moderate assistance;Sit to/from stand   Toilet Transfer: Min guard;Stand-pivot;BSC;RW   Toileting- Water quality scientist and Hygiene: Moderate assistance;Sit to/from stand Toileting - Clothing Manipulation Details (indicate cue type and reason): Pt incontinent of stool requires mod cues for peri care      Functional mobility during ADLs: Min guard (transfers only )       Vision         Perception     Praxis      Pertinent Vitals/Pain Pain Assessment: Faces Pain Score: 7  Faces Pain Scale: Hurts even more Pain Location: rectum Pain Descriptors / Indicators: Burning Pain Intervention(s): Monitored during session     Hand Dominance Right   Extremity/Trunk Assessment Upper Extremity Assessment Upper Extremity Assessment: Generalized weakness   Lower Extremity Assessment Lower Extremity Assessment: Defer to PT evaluation   Cervical / Trunk Assessment Cervical / Trunk Assessment: Kyphotic   Communication Communication Communication: HOH   Cognition Arousal/Alertness: Awake/alert Behavior During Therapy: WFL for tasks assessed/performed Overall Cognitive Status: No  family/caregiver present to determine baseline cognitive functioning                                 General Comments: Pt requires cues for problem solving and sequencing actitivity, as well as increased time and mod cues to initiate activity.   Unsure if this is her baseline, or due to anxiety re: incontinence    General Comments       Exercises     Shoulder Instructions      Home Living Family/patient expects to be discharged to:: Private residence Living Arrangements: Alone Available Help at Discharge: Family;Available 24 hours/day Type of Home: House Home Access: Stairs to enter [employer] of Steps: 3 to 4 Entrance Stairs-Rails: Left;Right Home Layout: One level     Bathroom Shower/Tub: Teacher, early years/pre: Handicapped height Bathroom Accessibility: Yes   Home Equipment: Environmental consultant - 2 wheels;Cane - single point;Bedside commode   Additional Comments: Pt reports she will have 24 hour assist either from dtr or dtr in law, and will stay with one of the two      Prior Functioning/Environment Level of Independence: Independent;Independent with assistive device(s)        Comments: uses cane when going out, no AD used inside home        OT Problem List: Decreased strength;Decreased activity tolerance;Impaired balance (sitting and/or standing);Decreased knowledge of use of DME or AE;Decreased safety awareness;Pain      OT Treatment/Interventions: Self-care/ADL training;DME and/or AE instruction;Therapeutic activities;Patient/family education;Balance training    OT Goals(Current goals can be found in the care plan section) Acute Rehab OT Goals Patient Stated Goal: to get back to normal  OT Goal Formulation: With patient Time For Goal Achievement: 12/13/16 Potential to Achieve Goals: Good ADL Goals Pt Will Perform Grooming: with supervision;standing Pt Will Perform Lower Body Bathing: with supervision;sit to/from stand Pt Will Perform Lower Body Dressing: with supervision;sit to/from stand Pt Will Transfer to Toilet: with supervision;ambulating;regular height toilet;bedside commode;grab bars Pt Will Perform Toileting - Clothing Manipulation and hygiene: with supervision;sit to/from  stand Pt Will Perform Tub/Shower Transfer: Tub transfer;with min guard assist;ambulating;shower seat;rolling walker  OT Frequency: Min 2X/week   Barriers to D/C:            Co-evaluation              AM-PAC PT "6 Clicks" Daily Activity     Outcome Measure Help from another person eating meals?: None Help from another person taking care of personal grooming?: A Little Help from another person toileting, which includes using toliet, bedpan, or urinal?: A Lot Help from another person bathing (including washing, rinsing, drying)?: A Little Help from another person to put on and taking off regular upper body clothing?: A Little Help from another person to put on and taking off regular lower body clothing?: A Lot 6 Click Score: 17   End of Session Equipment Utilized During Treatment: Rolling walker Nurse Communication: Mobility status  Activity Tolerance: Other (comment) (limited by incontinence ) Patient left: in chair;with call bell/phone within reach  OT Visit Diagnosis: Unsteadiness on feet (R26.81)                Time: 3790-2409 OT Time Calculation (min): 32 min Charges:  OT General Charges $OT Visit: 1 Visit OT Evaluation $OT Eval Moderate Complexity: 1 Mod OT Treatments $Self Care/Home Management : 8-22 mins G-Codes:     Tyvon Eggenberger, OTR/L  [phone]   Lucille Passy M 11/29/2016, 12:32 PM

## 2016-11-29 NOTE — Progress Notes (Signed)
[facility] Progress Note  Patient: Cathy [name] WRU:[id number]   PCP: Biagio Borg, MD DOB: February 12, 1928   DOA: 11/27/2016   DOS: 11/29/2016   Date of Service: the patient was seen and examined on 11/29/2016  Subjective: Still has abdominal pain. Breathing is stable. No nausea no vomiting.  Brief hospital course: Pt. with PMH of A. fib on epic [place], HTN, HIV, chronic diastolic CHF, anxiety; admitted on 11/27/2016, presented with complaint of diarrhea, was found to have constipation and located pleural effusion. Currently further plan is continue workup.  Assessment and Plan: Diarrhea Proctitis History of microscopic colitis. -Patient thought to be impacted, took 2 Miralax and has had nonstop diarrhea since -Initial concern was for infectious colitis but she was C diff negative -CT appears to indicate impaction with overflow incontinence Repeat x-ray abdomen this morning shows stool presence of stool. Continue enema. -Will stop Cipro/Flagyl as GI pathogen panel as well as C. difficile PCR both are negative.  Chronic diastolic CHF Right-sided pleural effusion Acute hypoxic failure -Echo in 6/17 showed preserved EF with grade 2 diastolic dysfunction; she had a depressed EF in 2016 -Patient presenting with cough that worsens at night without significant LE edema -Initial concern for PNA but CXR more consistent with pulmonary edema and CT appears to agree -Elevated WBC count, but this may be reactive in nature or could indicate acute infection -BNP 348.3; previously 421.8 in 12/16 when hospitalized with acute heart failure -Patient with known afib, Rate controlled   Echocardiogram shows preserved EF, no significant wall motion abnormalities. No significant valvular abnormality as well. Shows grade 3 diastolic dysfunction. Started on lisinopril currently on hold. -No beta blocker due to acute decompensation on presentation Patient was given 40 of Lasix times one, I would hold off  further diuresis for now. Will likely resume tomorrow. Right-sided pleural effusion is also reported to be loculated on the CT scan but a repeat decubitus chest x-ray on 11/29/2016 shows a free flowing fluid and therefore more likely consistent with CHF than any acute concerning abnormality.  Rib fractures -Pain control adequate but not ideal with Percocet -Will add Lidocaine patch -Incentive spirometry -PT/OT consults - Right pleural effusion does not appear to be hemothorax based on CT characteristics.  Aflutter -Rate controlled, on Amiodarone  -On Eliquis -If ongoing concern for GI bleeding, may need to stop/hold Eliquis  Elevated LFT. Patient has sudden increase in her liver enzymes in 200s. No abdominal pain no nausea no vomiting. Ultrasound abdomen unremarkable as well. We'll try to avoid toxic medication which includes amiodarone, Tylenol, Lipitor. Also recently started medications lisinopril, Cipro and Flagyl will also be discontinued. We'll monitor.  Diet: Soft diet DVT Prophylaxis: on therapeutic anticoagulation.  Advance goals of care discussion: DNR DNI  Family Communication: no family was present at bedside, at the time of interview.   Disposition:  Discharge to home with home health in 2-3 days  Consultants: none Procedures: none  Antibiotics: Anti-infectives    Start     Dose/Rate Route Frequency Ordered Stop   11/28/16 0100  ciprofloxacin (CIPRO) IVPB 400 mg  Status:  Discontinued     400 mg 200 mL/hr over 60 Minutes Intravenous 2 times daily 11/28/16 0049 11/29/16 1237   11/28/16 0100  metroNIDAZOLE (FLAGYL) IVPB 500 mg  Status:  Discontinued     500 mg 100 mL/hr over 60 Minutes Intravenous Every 8 hours 11/28/16 0049 11/29/16 1237       Objective: Physical Exam: Vitals:   11/28/16 1542  11/28/16 2029 11/29/16 0552 11/29/16 1433  BP: (!) 111/57 (!) 107/54 (!) 119/57 (!) 111/58  Pulse: 64 73 61 60  Resp: 20 19 18 18   Temp: 98.2 F (36.8 C) 98  F (36.7 C) (!) 97.5 F (36.4 C) 98.9 F (37.2 C)  TempSrc: Oral Oral Oral Oral  SpO2: 97% 94% 95% 98%  Weight: 60.3 kg (132 lb 15 oz)  58.9 kg (129 lb 13.6 oz)   Height: 4\' 9"  (1.448 m)       Intake/Output Summary (Last 24 hours) at 11/29/16 1807 Last data filed at 11/29/16 0962  Gross per 24 hour  Intake              760 ml  Output                0 ml  Net              760 ml   Filed Weights   11/27/16 1757 11/28/16 1542 11/29/16 0552  Weight: 57.2 kg (126 lb) 60.3 kg (132 lb 15 oz) 58.9 kg (129 lb 13.6 oz)   General: Alert, Awake and Oriented to Time, Place and Person. Appear in moderate distress, affect appropriate Eyes: PERRL, Conjunctiva normal ENT: Oral Mucosa clear moist. Neck: difficult to assess JVD, jno Abnormal Mass Or lumps Cardiovascular: S1 and S2 Present, aortic systolic Murmur, Peripheral Pulses Present Respiratory: normal respiratory effort, Bilateral Air entry equal and Decreased, no use of accessory muscle, basal Crackles, no wheezes Abdomen: Bowel Sound present, Soft and mild tenderness, no hernia Skin: no redness, no Rash, no induration Extremities: no Pedal edema, no calf tenderness Neurologic: Grossly no focal neuro deficit. Bilaterally Equal motor strength  Data Reviewed: CBC:  Recent Labs Lab 11/27/16 1851 11/27/16 1906 11/28/16 0458 11/29/16 0631  WBC 18.6*  --  18.9* 13.8*  NEUTROABS 16.0*  --   --  11.0*  HGB 12.2 10.5* 12.9 11.2*  HCT 37.6 31.0* 39.0 34.2*  MCV 93.5  --  92.2 91.2  PLT 190  --  200 836   Basic Metabolic Panel:  Recent Labs Lab 11/27/16 1851 11/27/16 1906 11/28/16 0458 11/29/16 0631  NA 138 141 138 140  K 4.5 3.9 3.8 3.6  CL 104 104 102 102  CO2 28  --  27 28  GLUCOSE 90 79 112* 80  BUN 14 14 13 16   CREATININE 0.81 0.70 0.79 0.82  CALCIUM 8.6*  --  8.9 8.7*  MG  --   --   --  1.9    Liver Function Tests:  Recent Labs Lab 11/27/16 1851 11/29/16 0631  AST 32 233*  ALT 35 282*  ALKPHOS 94 228*    BILITOT 0.9 0.7  PROT 5.9* 5.1*  ALBUMIN 3.1* 2.8*    Recent Labs Lab 11/27/16 1851  LIPASE 20   No results for input(s): AMMONIA in the last 168 hours. Coagulation Profile:  Recent Labs Lab 11/29/16 0631  INR 1.54   Cardiac Enzymes: No results for input(s): CKTOTAL, CKMB, CKMBINDEX, TROPONINI in the last 168 hours. BNP (last 3 results) No results for input(s): PROBNP in the last 8760 hours. CBG: No results for input(s): GLUCAP in the last 168 hours. Studies: Dg Chest Right Decubitus  Result Date: 11/29/2016 CLINICAL DATA:  Right pleural effusion EXAM: CHEST - RIGHT DECUBITUS COMPARISON:  PA and lateral chest radiographs November 27, 2016 FINDINGS: Right lateral decubitus image shows the moderate free-flowing pleural effusion on the right. There is bibasilar atelectasis. Lungs elsewhere  appear clear. Heart is mildly enlarged. Pulmonary vascularity appears within normal limits. No adenopathy evident. IMPRESSION: Moderate free-flowing right pleural effusion. Bibasilar atelectasis. Mild cardiac prominence, stable. Electronically Signed   By: Lowella Grip III M.D.   On: 11/29/2016 09:24   Dg Abd Portable 1v  Result Date: 11/29/2016 CLINICAL DATA:  Elevated liver function studies EXAM: PORTABLE ABDOMEN - 1 VIEW COMPARISON:  CT scan of the abdomen and pelvis of November 27, 2016 FINDINGS: The colonic stool burden remains moderately increased. No small or large bowel obstructive pattern is observed. There is a small amount of rectal stool present. There surgical clips in the gallbladder fossa. There is moderate levocurvature of the lumbar spine centered at L4. IMPRESSION: Moderately increased colonic stool burden persists. No evidence of obstruction. Electronically Signed   By: David  Martinique M.D.   On: 11/29/2016 09:01   US Abdomen Limited Ruq  Result Date: 11/29/2016 CLINICAL DATA:  Elevated liver function studies. History of previous cholecystectomy. EXAM: ULTRASOUND ABDOMEN  LIMITED RIGHT UPPER QUADRANT COMPARISON:  Abdominal and pelvic CT scan of November 27, 2016 and abdominal and pelvic CT scan of January 31, 2015. FINDINGS: Gallbladder: The gallbladder surgically absent. Common bile duct: Diameter: 16.4 mm which is markedly dilated but which has been previously demonstrated. No intraluminal stones or sludge are observed. Liver: There is intrahepatic ductal dilation which is not a new finding. In the left lobe there is a hyperechoic focus measuring approximately 1 cm in diameter most compatible with a hemangioma. The surface contour of the liver is smooth. Portal vein is patent on color Doppler imaging with normal direction of blood flow towards the liver. There is a right pleural effusion. IMPRESSION: Chronic dilation of the intra- and extra-hepatic bile ducts. Previous cholecystectomy. Probable hemangioma in the left hepatic lobe measuring 1 cm in diameter which is been previously demonstrated. Electronically Signed   By: David  Martinique M.D.   On: 11/29/2016 15:08    Scheduled Meds: . apixaban  2.5 mg Oral BID  . darifenacin  15 mg Oral Daily  . isosorbide mononitrate  30 mg Oral Daily  . lidocaine  1 patch Transdermal Q24H  . pantoprazole  40 mg Oral Daily  . pregabalin  50 mg Oral TID   Continuous Infusions:  PRN Meds: guaiFENesin-dextromethorphan, morphine injection, ondansetron **OR** ondansetron (ZOFRAN) IV  Time spent: 35 minutes  Author: Berle Mull, MD [facility] Pager: [phone] 11/29/2016 6:07 PM  If 7PM-7AM, please contact night-coverage at [email or web address], password [facility]

## 2016-11-29 NOTE — Care Management Note (Signed)
Case Management Note  Patient Details  Name: Cathy [name] MRN: [id number] Date of Birth: [birth year]  Subjective/Objective:                  Abd.pain, uti elevated wbc  Action/Plan: Date:  November 29, 2016 Chart reviewed for concurrent status and case management needs.  Will continue to follow patient progress.  Discharge Planning: following for needs  Expected discharge date: [id number]  Velva Harman, BSN, [place], [place]   Expected Discharge Date:                  Expected Discharge Plan:  [name]  In-House Referral:     Discharge planning Services  CM Consult  Post Acute Care Choice:    Choice offered to:  Patient  DME Arranged:    DME Agency:     HH Arranged:  Disease Management [place] Agency:  [place]  Status of Service:     If discussed at [place] of Stay Meetings, dates discussed:    Additional Comments:  Leeroy Cha, RN 11/29/2016, 11:46 AM

## 2016-11-29 NOTE — Evaluation (Signed)
Physical Therapy Evaluation Patient Details Name: Cathy [name] MRN: [id number] DOB: [birth year] Today's Date: 11/29/2016   History of Present Illness   Cathy [name] is a 83 y.o. female with medical history significant of afib on Eliquis; HTN; HLD; systolic CHF; anxiety; and arthritis presenting with diarrhea starting 11/16/16;   recent fall resulting in  fracture of 2 ribs.    Clinical Impression  Pt admitted with above diagnosis. Pt currently with functional limitations due to the deficits listed below (see PT Problem List). * Pt will benefit from skilled PT to increase their independence and safety with mobility to allow discharge to the venue listed below.   Pt is unsteady with gait and deconditioned; O2 sats 87% on RA with activity; O2 replaced at 2L with sats >91%; Will continue to follow in acute setting, would likely benefit from SNF but declines at this time, prefers to d/c home with support of family     Follow Up Recommendations Home health PT;Supervision for mobility/OOB (pt declines SNF at time of eval)    Equipment Recommendations    none   Recommendations for Other Services       Precautions / Restrictions Precautions Precautions: Fall Restrictions Weight Bearing Restrictions: No      Mobility  Bed Mobility Overal bed mobility: Needs Assistance Bed Mobility: Supine to Sit     Supine to sit: Min assist;Min guard     General bed mobility comments: cues for technique, self assist; needs incr time  Transfers Overall transfer level: Needs assistance Equipment used: Rolling walker (2 wheeled) Transfers: Sit to/from Stand Sit to Stand: Min assist         General transfer comment: sit to stand x2 for pericare (incontinent of stool) verbal cues for hand placement and safety  Ambulation/Gait Ambulation/Gait assistance: Min assist Ambulation Distance (Feet): 5 Feet Assistive device: Rolling walker (2 wheeled) Gait Pattern/deviations: Step-to pattern      General Gait Details: cues for RW safety, min/guard for balance  Stairs            Wheelchair Mobility    Modified Rankin (Stroke Patients Only)       Balance Overall balance assessment: Needs assistance Sitting-balance support: Feet supported Sitting balance-Leahy Scale: Fair     Standing balance support: Bilateral upper extremity supported;Single extremity supported;During functional activity Standing balance-Leahy Scale: Fair Standing balance comment: pt assisted with peri-care when min/guard for balance                             Pertinent Vitals/Pain Pain Assessment: 0-10 Pain Score: 7  Pain Location: rectum Pain Descriptors / Indicators: Burning Pain Intervention(s): Monitored during session    Home Living Family/patient expects to be discharged to:: Private residence Living Arrangements: Alone Available Help at Discharge: Family;Available PRN/intermittently Type of Home: House Home Access: Stairs to enter   Entrance Stairs-Number of Steps: 3 to 4 Home Layout: One level Home Equipment: Walker - 2 wheels;Cane - single point;Bedside commode Additional Comments: pt has been staying with her dtr-in-law since fall a few wks ago; dtr-in-law works form home    Prior Function Level of Independence: Independent;Independent with assistive device(s)         Comments: uses cane when going out, no AD used inside home     Hand Dominance        Extremity/Trunk Assessment   Upper Extremity Assessment Upper Extremity Assessment: Generalized weakness    Lower Extremity Assessment Lower  Extremity Assessment: Generalized weakness       Communication   Communication: HOH  Cognition Arousal/Alertness: Awake/alert Behavior During Therapy: WFL for tasks assessed/performed Overall Cognitive Status: Within Functional Limits for tasks assessed                                        General Comments      Exercises      Assessment/Plan    PT Assessment Patient needs continued PT services  PT Problem List Decreased strength;Decreased activity tolerance;Decreased balance;Decreased mobility;Decreased knowledge of use of DME       PT Treatment Interventions DME instruction;Gait training;Functional mobility training;Therapeutic activities;Therapeutic exercise;Patient/family education    PT Goals (Current goals can be found in the Care Plan section)  Acute Rehab PT Goals Patient Stated Goal: get better PT Goal Formulation: With patient Time For Goal Achievement: 12/13/16 Potential to Achieve Goals: Good    Frequency Min 3X/week   Barriers to discharge        Co-evaluation               AM-PAC PT "6 Clicks" Daily Activity  Outcome Measure Difficulty turning over in bed (including adjusting bedclothes, sheets and blankets)?: A Little Difficulty moving from lying on back to sitting on the side of the bed? : A Little Difficulty sitting down on and standing up from a chair with arms (e.g., wheelchair, bedside commode, etc,.)?: A Little Help needed moving to and from a bed to chair (including a wheelchair)?: A Little Help needed walking in hospital room?: A Little Help needed climbing 3-5 steps with a railing? : A Lot 6 Click Score: 17    End of Session Equipment Utilized During Treatment: Gait belt Activity Tolerance: Patient tolerated treatment well;Other (comment) (limited d/t incontinence) Patient left: in chair;with call bell/phone within reach;with chair alarm set Nurse Communication: Mobility status PT Visit Diagnosis: Difficulty in walking, not elsewhere classified (R26.2)    Time: 9628-3662 PT Time Calculation (min) (ACUTE ONLY): 25 min   Charges:   PT Evaluation $PT Eval Low Complexity: 1 Low PT Treatments $Therapeutic Activity: 8-22 mins   PT G CodesKenyon Ana, PT Pager: [phone] 11/29/2016'  Arcelia Pals 11/29/2016, 11:46 AM

## 2016-11-29 NOTE — Progress Notes (Signed)
Nutrition Education Note  RD consulted for nutrition education regarding new onset CHF.  RD provided "Low Sodium Nutrition Therapy" handout from the Academy [employer]. Reviewed patient's dietary recall. Provided examples on ways to decrease sodium intake in diet. Discouraged intake of processed foods and use of salt shaker. Encouraged fresh fruits and vegetables as well as whole grain sources of carbohydrates to maximize fiber intake.   RD discussed why it is important for patient to adhere to diet recommendations, and emphasized the role of fluids, foods to avoid, and importance of weighing self daily. Teach back method used.  Expect fair compliance.   Spoke with pt at bedside who reports living at home. She prepares most of her meals and does not add salt to the items she is cooking. Discussed products that contain higher sodium and what to look out for when grocery shopping. Pt seems to have great support at home. Appetite reported to be stable before/during admission. No unintentional wt loss reported. Pt's UBW typically stays around 125 lb.   Body mass index is 28.1 kg/m. Pt meets criteria for overweight based on current BMI.  Current diet order is soft diet. Labs and medications reviewed. No further nutrition interventions warranted at this time. RD contact information provided. If additional nutrition issues arise, please re-consult RD.   Cathy [name] RD, LDN Clinical Nutrition Pager # [phone]

## 2016-11-30 LAB — CBC WITH DIFFERENTIAL/PLATELET
Basophils Absolute: 0 10*3/uL (ref 0.0–0.1)
Basophils Relative: 0 %
Eosinophils Absolute: 0.3 10*3/uL (ref 0.0–0.7)
Eosinophils Relative: 3 %
HEMATOCRIT: 35 % — AB (ref 36.0–46.0)
Hemoglobin: 11.4 g/dL — ABNORMAL LOW (ref 12.0–15.0)
LYMPHS ABS: 0.8 10*3/uL (ref 0.7–4.0)
LYMPHS PCT: 8 %
MCH: 30.2 pg (ref 26.0–34.0)
MCHC: 32.6 g/dL (ref 30.0–36.0)
MCV: 92.6 fL (ref 78.0–100.0)
MONO ABS: 1.2 10*3/uL — AB (ref 0.1–1.0)
MONOS PCT: 11 %
NEUTROS ABS: 8.3 10*3/uL — AB (ref 1.7–7.7)
Neutrophils Relative %: 78 %
Platelets: 196 10*3/uL (ref 150–400)
RBC: 3.78 MIL/uL — ABNORMAL LOW (ref 3.87–5.11)
RDW: 14 % (ref 11.5–15.5)
WBC: 10.6 10*3/uL — ABNORMAL HIGH (ref 4.0–10.5)

## 2016-11-30 LAB — COMPREHENSIVE METABOLIC PANEL
ALBUMIN: 2.8 g/dL — AB (ref 3.5–5.0)
ALT: 178 U/L — ABNORMAL HIGH (ref 14–54)
ANION GAP: 7 (ref 5–15)
AST: 100 U/L — AB (ref 15–41)
Alkaline Phosphatase: 191 U/L — ABNORMAL HIGH (ref 38–126)
BUN: 14 mg/dL (ref 6–20)
CO2: 29 mmol/L (ref 22–32)
Calcium: 8.4 mg/dL — ABNORMAL LOW (ref 8.9–10.3)
Chloride: 101 mmol/L (ref 101–111)
Creatinine, Ser: 0.77 mg/dL (ref 0.44–1.00)
GFR calc Af Amer: >60 mL/min (ref >60)
GFR calc non Af Amer: >60 mL/min (ref >60)
GLUCOSE: 85 mg/dL (ref 65–99)
POTASSIUM: 3.6 mmol/L (ref 3.5–5.1)
Sodium: 137 mmol/L (ref 135–145)
Total Bilirubin: 0.6 mg/dL (ref 0.3–1.2)
Total Protein: 5.2 g/dL — ABNORMAL LOW (ref 6.5–8.1)

## 2016-11-30 LAB — MAGNESIUM: Magnesium: 1.8 mg/dL (ref 1.7–2.4)

## 2016-11-30 MED ORDER — FUROSEMIDE 10 MG/ML IJ SOLN
40.0000 mg | Freq: Two times a day (BID) | INTRAMUSCULAR | Status: DC
  Administered 2016-11-30 – 2016-12-02 (×5): 40 mg via INTRAVENOUS
  Filled 2016-11-30 (×5): qty 4

## 2016-11-30 NOTE — Progress Notes (Signed)
[facility] Progress Note  Patient: Cathy [name] ERD:[id number]   PCP: Biagio Borg, MD DOB: 1927-10-01   DOA: 11/27/2016   DOS: 11/30/2016   Date of Service: the patient was seen and examined on 11/30/2016  Subjective: abdominal pain, nausea and vomiting resolved. No fever or chills.   Brief hospital course: Pt. with PMH of A. fib on epic [place], HTN, HIV, chronic diastolic CHF, anxiety; admitted on 11/27/2016, presented with complaint of diarrhea, was found to have constipation and located pleural effusion. Currently further plan is continue workup.  Assessment and Plan: Diarrhea Proctitis History of microscopic colitis. -Patient thought to be impacted, took 2 Miralax and has had nonstop diarrhea since -Initial concern was for infectious colitis but she was C diff negative -CT appears to indicate impaction with overflow incontinence. Repeat x-ray abdomen this morning shows stool presence of stool. Continue enema. -Will stop Cipro/Flagyl as GI pathogen panel as well as C. difficile PCR both are negative. - symptoms are now better.  Chronic diastolic CHF Right-sided pleural effusion Acute hypoxic failure -Echo in 6/17 showed preserved EF with grade 2 diastolic dysfunction; she had a depressed EF in 2016. -Patient presenting with cough that worsens at night without significant LE edema. -Initial concern for PNA but CXR more consistent with pulmonary edema and CT appears to agree. -Elevated WBC count, but this may be reactive in nature or could indicate acute infection. -BNP 348.3; previously 421.8 in 12/16 when hospitalized with acute heart failure. -Patient with known afib, Rate controlled.  Echocardiogram shows preserved EF, no significant wall motion abnormalities. No significant valvular abnormality as well. Shows grade 3 diastolic dysfunction. Started on lisinopril currently on hold. -No beta blocker due to acute decompensation on presentation Patient was given 40 of  Lasix on admission, was start her on 40 mg twice a day on 11/30/2016. Also add incentive spirometry. Right-sided pleural effusion is also reported to be loculated on the CT scan but a repeat decubitus chest x-ray on 11/29/2016 shows a free flowing fluid and therefore more likely consistent with CHF than any acute concerning abnormality.  Rib fractures -Pain control adequate but not ideal with Percocet -Will add Lidocaine patch -Incentive spirometry -PT/OT consults - Right pleural effusion does not appear to be hemothorax based on CT characteristics.  Aflutter -Rate controlled, on Amiodarone  -On Eliquis -If ongoing concern for GI bleeding, may need to stop/hold Eliquis  Elevated LFT. Patient has sudden increase in her liver enzymes in 200s. No abdominal pain no nausea no vomiting. Ultrasound abdomen unremarkable as well. We'll try to avoid toxic medication which includes amiodarone, Tylenol, Lipitor. Also recently started medications lisinopril, Cipro and Flagyl will also be discontinued. We'll monitor.  Diet: Soft diet DVT Prophylaxis: on therapeutic anticoagulation.  Advance goals of care discussion: DNR DNI  Family Communication: no family was present at bedside, at the time of interview. Discussed with patient's daughter on phone.  Disposition:  Discharge to home with home health in 2-3 days  Consultants: none Procedures: none  Antibiotics: Anti-infectives    Start     Dose/Rate Route Frequency Ordered Stop   11/28/16 0100  ciprofloxacin (CIPRO) IVPB 400 mg  Status:  Discontinued     400 mg 200 mL/hr over 60 Minutes Intravenous 2 times daily 11/28/16 0049 11/29/16 1237   11/28/16 0100  metroNIDAZOLE (FLAGYL) IVPB 500 mg  Status:  Discontinued     500 mg 100 mL/hr over 60 Minutes Intravenous Every 8 hours 11/28/16 0049 11/29/16 1237  Objective: Physical Exam: Vitals:   11/29/16 2253 11/30/16 0508 11/30/16 0554 11/30/16 1500  BP: 126/62 (!) 162/74 (!)  151/71 117/63  Pulse: (!) 59 64 (!) 58 60  Resp: 18 20  18   Temp: 98.2 F (36.8 C) 97.7 F (36.5 C)  98.8 F (37.1 C)  TempSrc: Oral Oral  Oral  SpO2: 98% 94%  96%  Weight:  56.8 kg (125 lb 3.2 oz)    Height:        Intake/Output Summary (Last 24 hours) at 11/30/16 1853 Last data filed at 11/30/16 1435  Gross per 24 hour  Intake              220 ml  Output                0 ml  Net              220 ml   Filed Weights   11/28/16 1542 11/29/16 0552 11/30/16 0508  Weight: 60.3 kg (132 lb 15 oz) 58.9 kg (129 lb 13.6 oz) 56.8 kg (125 lb 3.2 oz)   General: Alert, Awake and Oriented to Time, Place and Person. Appear in moderate distress, affect appropriate Eyes: PERRL, Conjunctiva normal ENT: Oral Mucosa clear moist. Neck: difficult to assess JVD, jno Abnormal Mass Or lumps Cardiovascular: S1 and S2 Present, aortic systolic Murmur, Peripheral Pulses Present Respiratory: normal respiratory effort, Bilateral Air entry equal and Decreased, no use of accessory muscle, basal Crackles, no wheezes Abdomen: Bowel Sound present, Soft and mild tenderness, no hernia Skin: no redness, no Rash, no induration Extremities: no Pedal edema, no calf tenderness Neurologic: Grossly no focal neuro deficit. Bilaterally Equal motor strength  Data Reviewed: CBC:  Recent Labs Lab 11/27/16 1851 11/27/16 1906 11/28/16 0458 11/29/16 0631 11/30/16 0614  WBC 18.6*  --  18.9* 13.8* 10.6*  NEUTROABS 16.0*  --   --  11.0* 8.3*  HGB 12.2 10.5* 12.9 11.2* 11.4*  HCT 37.6 31.0* 39.0 34.2* 35.0*  MCV 93.5  --  92.2 91.2 92.6  PLT 190  --  200 200 831   Basic Metabolic Panel:  Recent Labs Lab 11/27/16 1851 11/27/16 1906 11/28/16 0458 11/29/16 0631 11/30/16 0614  NA 138 141 138 140 137  K 4.5 3.9 3.8 3.6 3.6  CL 104 104 102 102 101  CO2 28  --  27 28 29   GLUCOSE 90 79 112* 80 85  BUN 14 14 13 16 14   CREATININE 0.81 0.70 0.79 0.82 0.77  CALCIUM 8.6*  --  8.9 8.7* 8.4*  MG  --   --   --  1.9 1.8      Liver Function Tests:  Recent Labs Lab 11/27/16 1851 11/29/16 0631 11/30/16 0614  AST 32 233* 100*  ALT 35 282* 178*  ALKPHOS 94 228* 191*  BILITOT 0.9 0.7 0.6  PROT 5.9* 5.1* 5.2*  ALBUMIN 3.1* 2.8* 2.8*    Recent Labs Lab 11/27/16 1851  LIPASE 20   No results for input(s): AMMONIA in the last 168 hours. Coagulation Profile:  Recent Labs Lab 11/29/16 0631  INR 1.54   Cardiac Enzymes: No results for input(s): CKTOTAL, CKMB, CKMBINDEX, TROPONINI in the last 168 hours. BNP (last 3 results) No results for input(s): PROBNP in the last 8760 hours. CBG: No results for input(s): GLUCAP in the last 168 hours. Studies: No results found.  Scheduled Meds: . apixaban  2.5 mg Oral BID  . darifenacin  15 mg Oral Daily  . furosemide  40 mg Intravenous BID  . hydrocortisone cream   Topical BID  . isosorbide mononitrate  30 mg Oral Daily  . lidocaine  1 patch Transdermal Q24H  . pantoprazole  40 mg Oral Daily  . pregabalin  50 mg Oral TID   Continuous Infusions:  PRN Meds: guaiFENesin-dextromethorphan, morphine injection, ondansetron **OR** ondansetron (ZOFRAN) IV  Time spent: 35 minutes  Author: Berle Mull, MD [facility] Pager: [phone] 11/30/2016 6:53 PM  If 7PM-7AM, please contact night-coverage at [email or web address], password [facility]

## 2016-12-01 ENCOUNTER — Inpatient Hospital Stay (HOSPITAL_COMMUNITY): Payer: Medicare Other

## 2016-12-01 DIAGNOSIS — R1013 Epigastric pain: Secondary | ICD-10-CM

## 2016-12-01 LAB — COMPREHENSIVE METABOLIC PANEL
ALBUMIN: 2.9 g/dL — AB (ref 3.5–5.0)
ALT: 128 U/L — AB (ref 14–54)
AST: 56 U/L — AB (ref 15–41)
Alkaline Phosphatase: 171 U/L — ABNORMAL HIGH (ref 38–126)
Anion gap: 7 (ref 5–15)
BILIRUBIN TOTAL: 0.6 mg/dL (ref 0.3–1.2)
BUN: 14 mg/dL (ref 6–20)
CALCIUM: 8.5 mg/dL — AB (ref 8.9–10.3)
CHLORIDE: 99 mmol/L — AB (ref 101–111)
CO2: 31 mmol/L (ref 22–32)
CREATININE: 0.73 mg/dL (ref 0.44–1.00)
GFR calc Af Amer: >60 mL/min (ref >60)
Glucose, Bld: 91 mg/dL (ref 65–99)
Potassium: 3.5 mmol/L (ref 3.5–5.1)
Sodium: 137 mmol/L (ref 135–145)
Total Protein: 5.6 g/dL — ABNORMAL LOW (ref 6.5–8.1)

## 2016-12-01 LAB — BASIC METABOLIC PANEL
ANION GAP: 6 (ref 5–15)
BUN: 15 mg/dL (ref 6–20)
CO2: 31 mmol/L (ref 22–32)
Calcium: 8.2 mg/dL — ABNORMAL LOW (ref 8.9–10.3)
Chloride: 97 mmol/L — ABNORMAL LOW (ref 101–111)
Creatinine, Ser: 0.78 mg/dL (ref 0.44–1.00)
Glucose, Bld: 113 mg/dL — ABNORMAL HIGH (ref 65–99)
POTASSIUM: 3.6 mmol/L (ref 3.5–5.1)
SODIUM: 134 mmol/L — AB (ref 135–145)

## 2016-12-01 LAB — CBC WITH DIFFERENTIAL/PLATELET
BASOS PCT: 0 %
Basophils Absolute: 0 10*3/uL (ref 0.0–0.1)
EOS ABS: 0.2 10*3/uL (ref 0.0–0.7)
EOS PCT: 2 %
HCT: 37.3 % (ref 36.0–46.0)
Hemoglobin: 12.3 g/dL (ref 12.0–15.0)
LYMPHS ABS: 0.9 10*3/uL (ref 0.7–4.0)
Lymphocytes Relative: 8 %
MCH: 29.6 pg (ref 26.0–34.0)
MCHC: 33 g/dL (ref 30.0–36.0)
MCV: 89.7 fL (ref 78.0–100.0)
Monocytes Absolute: 1.2 10*3/uL — ABNORMAL HIGH (ref 0.1–1.0)
Monocytes Relative: 12 %
Neutro Abs: 7.9 10*3/uL — ABNORMAL HIGH (ref 1.7–7.7)
Neutrophils Relative %: 78 %
PLATELETS: 235 10*3/uL (ref 150–400)
RBC: 4.16 MIL/uL (ref 3.87–5.11)
RDW: 13.6 % (ref 11.5–15.5)
WBC: 10.2 10*3/uL (ref 4.0–10.5)

## 2016-12-01 LAB — MAGNESIUM
MAGNESIUM: 1.8 mg/dL (ref 1.7–2.4)
MAGNESIUM: 1.7 mg/dL (ref 1.7–2.4)

## 2016-12-01 LAB — TROPONIN I

## 2016-12-01 LAB — TSH: TSH: 3.204 u[IU]/mL (ref 0.350–4.500)

## 2016-12-01 MED ORDER — DOCUSATE SODIUM 100 MG PO CAPS
100.0000 mg | ORAL_CAPSULE | Freq: Two times a day (BID) | ORAL | Status: DC
  Administered 2016-12-01 – 2016-12-02 (×3): 100 mg via ORAL
  Filled 2016-12-01 (×4): qty 1

## 2016-12-01 MED ORDER — POTASSIUM CHLORIDE 10 MEQ/100ML IV SOLN
10.0000 meq | INTRAVENOUS | Status: AC
  Administered 2016-12-01 (×2): 10 meq via INTRAVENOUS
  Filled 2016-12-01 (×2): qty 100

## 2016-12-01 MED ORDER — SENNA 8.6 MG PO TABS
1.0000 | ORAL_TABLET | Freq: Every day | ORAL | Status: DC
  Administered 2016-12-01 – 2016-12-02 (×2): 8.6 mg via ORAL
  Filled 2016-12-01 (×3): qty 1

## 2016-12-01 MED ORDER — POLYETHYLENE GLYCOL 3350 17 G PO PACK
17.0000 g | PACK | Freq: Every day | ORAL | Status: DC
  Administered 2016-12-01 – 2016-12-02 (×2): 17 g via ORAL
  Filled 2016-12-01 (×3): qty 1

## 2016-12-01 MED ORDER — MAGNESIUM SULFATE 2 GM/50ML IV SOLN
2.0000 g | Freq: Once | INTRAVENOUS | Status: AC
Start: 2016-12-01 — End: 2016-12-01
  Administered 2016-12-01: 2 g via INTRAVENOUS
  Filled 2016-12-01: qty 50

## 2016-12-01 NOTE — Progress Notes (Signed)
PROGRESS NOTE    Cathy [name]  XAJ:[id number] DOB: [birth year] DOA: 11/27/2016 PCP: Biagio Borg, MD   Brief Narrative:  83 year old female with history of hypertension, atrial fibrillation on Apixaban, chronic diastolic CHF, HIV, anxiety admitted to the hospital for evaluation of diarrhea/constipation and pleural effusion.   Assessment & Plan:   Principal Problem:   Diarrhea Active Problems:   Atrial flutter (HCC)   Chronic combined systolic and diastolic CHF (congestive heart failure) (HCC)   Rib fractures   CHF exacerbation (HCC)  Diarrhea with history of severe constipation Proctitis, history of microscopic colitis -Patient is thought to have diarrhea around her severe constipation -Her C. difficile was negative -We'll order tap water enema. Order MiraLAX, senna and Colace scheduled -Repeat abdominal x-ray -Antibiotics as needed, IV fluids  Right-sided pleural effusion Chronic diastolic CHF -Grade 3 diastolic dysfunction on the echocardiogram -Currently getting IV diuresis, caution with her renal function. This is stable at this time -Plan on repeating chest x-ray tomorrow morning -Supplemental oxygen as needed. Continue Imdur 30 mg daily  History of atrial flutter and afibrillation -Continue Eliquis 2.5mg  bid. Pharmacy to confirm if she is on Amiodarone.   Rib acture -Pain control, lidocaine patch  Generalized weakness -PT/OT ordered  Transaminitis -Stable at this time. Ultrasound shows hemangioma which is stable  Splenic lesion -Slightly and enlarged from previous scan. Follow-up outpatient next  Adrenal mass on the left side -Appears to be stable from previous scan. Follow-up outpatient   DVT prophylaxis: Eliquis Code Status: DO NOT RESUSCITATE Family Communication:  None at bedside Disposition Plan: To be determined. Hopefully once Home with home PT once medically stable.   Consultants:   None  Procedures:   None  Antimicrobials:    None   Subjective: No acute overnight events. States she has some bowel movement early today but it was loose and did not check how much.   Objective: Vitals:   11/30/16 0554 11/30/16 1500 11/30/16 2008 12/01/16 0610  BP: (!) 151/71 117/63 117/64 (!) 145/85  Pulse: (!) 58 60 (!) 57 60  Resp:  18 18 18   Temp:  98.8 F (37.1 C) 97.6 F (36.4 C) 98.4 F (36.9 C)  TempSrc:  Oral Oral Oral  SpO2:  96% 100% 93%  Weight:    71.2 kg (157 lb)  Height:        Intake/Output Summary (Last 24 hours) at 12/01/16 1248 Last data filed at 12/01/16 1231  Gross per 24 hour  Intake             1030 ml  Output              325 ml  Net              705 ml   Filed Weights   11/29/16 0552 11/30/16 0508 12/01/16 0610  Weight: 58.9 kg (129 lb 13.6 oz) 56.8 kg (125 lb 3.2 oz) 71.2 kg (157 lb)    Examination:  General exam: Appears calm and comfortable , Elderly frail-appearing. Dry mucous membrane Respiratory system: Clear to auscultation. Respiratory effort normal. Cardiovascular system: S1 & S2 heard, RRR. No JVD, murmurs, rubs, gallops or clicks. No pedal edema. Gastrointestinal system: Abdomen is nondistended, soft and nontender. No organomegaly or masses felt. Normal bowel sounds heard. Central nervous system: Alert and oriented. No focal neurological deficits. Extremities: Symmetric 5 x 5 power. Skin: No rashes, lesions or ulcers Psychiatry: Judgement and insight appear normal. Mood & affect appropriate.  Data Reviewed:   CBC:  Recent Labs Lab 11/27/16 1851 11/27/16 1906 11/28/16 0458 11/29/16 0631 11/30/16 8315 12/01/16 0613  WBC 18.6*  --  18.9* 13.8* 10.6* 10.2  NEUTROABS 16.0*  --   --  11.0* 8.3* 7.9*  HGB 12.2 10.5* 12.9 11.2* 11.4* 12.3  HCT 37.6 31.0* 39.0 34.2* 35.0* 37.3  MCV 93.5  --  92.2 91.2 92.6 89.7  PLT 190  --  200 200 196 176   Basic Metabolic Panel:  Recent Labs Lab 11/27/16 1851 11/27/16 1906 11/28/16 0458 11/29/16 0631 11/30/16 0614  12/01/16 0613  NA 138 141 138 140 137 137  K 4.5 3.9 3.8 3.6 3.6 3.5  CL 104 104 102 102 101 99*  CO2 28  --  27 28 29 31   GLUCOSE 90 79 112* 80 85 91  BUN 14 14 13 16 14 14   CREATININE 0.81 0.70 0.79 0.82 0.77 0.73  CALCIUM 8.6*  --  8.9 8.7* 8.4* 8.5*  MG  --   --   --  1.9 1.8 1.8   GFR: Estimated Creatinine Clearance: 40.4 mL/min (by C-G formula based on SCr of 0.73 mg/dL). Liver Function Tests:  Recent Labs Lab 11/27/16 1851 11/29/16 0631 11/30/16 0614 12/01/16 0613  AST 32 233* 100* 56*  ALT 35 282* 178* 128*  ALKPHOS 94 228* 191* 171*  BILITOT 0.9 0.7 0.6 0.6  PROT 5.9* 5.1* 5.2* 5.6*  ALBUMIN 3.1* 2.8* 2.8* 2.9*    Recent Labs Lab 11/27/16 1851  LIPASE 20   No results for input(s): AMMONIA in the last 168 hours. Coagulation Profile:  Recent Labs Lab 11/29/16 0631  INR 1.54   Cardiac Enzymes: No results for input(s): CKTOTAL, CKMB, CKMBINDEX, TROPONINI in the last 168 hours. BNP (last 3 results) No results for input(s): PROBNP in the last 8760 hours. HbA1C: No results for input(s): HGBA1C in the last 72 hours. CBG: No results for input(s): GLUCAP in the last 168 hours. Lipid Profile: No results for input(s): CHOL, HDL, LDLCALC, TRIG, CHOLHDL, LDLDIRECT in the last 72 hours. Thyroid Function Tests: No results for input(s): TSH, T4TOTAL, FREET4, T3FREE, THYROIDAB in the last 72 hours. Anemia Panel: No results for input(s): VITAMINB12, FOLATE, FERRITIN, TIBC, IRON, RETICCTPCT in the last 72 hours. Sepsis Labs:  Recent Labs Lab 11/29/16 0631  LATICACIDVEN 0.8    Recent Results (from the past 240 hour(s))  Gastrointestinal Panel by PCR , Stool     Status: None   Collection Time: 11/27/16  6:54 PM  Result Value Ref Range Status   Campylobacter species NOT DETECTED NOT DETECTED Final   Plesimonas shigelloides NOT DETECTED NOT DETECTED Final   Salmonella species NOT DETECTED NOT DETECTED Final   Yersinia enterocolitica NOT DETECTED NOT DETECTED  Final   Vibrio species NOT DETECTED NOT DETECTED Final   Vibrio cholerae NOT DETECTED NOT DETECTED Final   Enteroaggregative E coli (EAEC) NOT DETECTED NOT DETECTED Final   Enteropathogenic E coli (EPEC) NOT DETECTED NOT DETECTED Final   Enterotoxigenic E coli (ETEC) NOT DETECTED NOT DETECTED Final   Shiga like toxin producing E coli (STEC) NOT DETECTED NOT DETECTED Final   Shigella/Enteroinvasive E coli (EIEC) NOT DETECTED NOT DETECTED Final   Cryptosporidium NOT DETECTED NOT DETECTED Final   Cyclospora cayetanensis NOT DETECTED NOT DETECTED Final   Entamoeba histolytica NOT DETECTED NOT DETECTED Final   Giardia lamblia NOT DETECTED NOT DETECTED Final   Adenovirus F40/41 NOT DETECTED NOT DETECTED Final   Astrovirus NOT DETECTED NOT  DETECTED Final   Norovirus GI/GII NOT DETECTED NOT DETECTED Final   Rotavirus A NOT DETECTED NOT DETECTED Final   Sapovirus (I, II, IV, and V) NOT DETECTED NOT DETECTED Final  C difficile quick scan w PCR reflex     Status: None   Collection Time: 11/27/16  6:54 PM  Result Value Ref Range Status   C Diff antigen NEGATIVE NEGATIVE Final   C Diff toxin NEGATIVE NEGATIVE Final   C Diff interpretation No C. difficile detected.  Final         Radiology Studies: US Abdomen Limited Ruq  Result Date: 11/29/2016 CLINICAL DATA:  Elevated liver function studies. History of previous cholecystectomy. EXAM: ULTRASOUND ABDOMEN LIMITED RIGHT UPPER QUADRANT COMPARISON:  Abdominal and pelvic CT scan of November 27, 2016 and abdominal and pelvic CT scan of January 31, 2015. FINDINGS: Gallbladder: The gallbladder surgically absent. Common bile duct: Diameter: 16.4 mm which is markedly dilated but which has been previously demonstrated. No intraluminal stones or sludge are observed. Liver: There is intrahepatic ductal dilation which is not a new finding. In the left lobe there is a hyperechoic focus measuring approximately 1 cm in diameter most compatible with a hemangioma.  The surface contour of the liver is smooth. Portal vein is patent on color Doppler imaging with normal direction of blood flow towards the liver. There is a right pleural effusion. IMPRESSION: Chronic dilation of the intra- and extra-hepatic bile ducts. Previous cholecystectomy. Probable hemangioma in the left hepatic lobe measuring 1 cm in diameter which is been previously demonstrated. Electronically Signed   By: David  Martinique M.D.   On: 11/29/2016 15:08        Scheduled Meds: . apixaban  2.5 mg Oral BID  . darifenacin  15 mg Oral Daily  . furosemide  40 mg Intravenous BID  . hydrocortisone cream   Topical BID  . isosorbide mononitrate  30 mg Oral Daily  . lidocaine  1 patch Transdermal Q24H  . pantoprazole  40 mg Oral Daily  . pregabalin  50 mg Oral TID   Continuous Infusions:   LOS: 4 days    Time spent: 25 mins    Mackena Plummer Arsenio Loader, MD [facility] Pager [phone]   If 7PM-7AM, please contact night-coverage [email or web address] Password [email or web address] 12/01/2016, 12:48 PM

## 2016-12-01 NOTE — Progress Notes (Signed)
Notified by CMT that pt had 2.31 sec pause on tele.  Went to room to assess pt.  NT at bedside obtaining routine vital signs and states pt slightly confused which is not her normal status.  VSS.  Pt requesting to speak with daughter.  MD  Notified and new orders given and followed.

## 2016-12-01 NOTE — Progress Notes (Signed)
Occupational Therapy Treatment Patient Details Name: Cathy [name] MRN: [id number] DOB: 1927/07/11 Today's Date: 12/01/2016    History of present illness  Cathy [name] is a 83 y.o. female with medical history significant of afib on Eliquis; HTN; HLD; systolic CHF; anxiety; and arthritis presenting with diarrhea starting 9/28.  recent fall resulting in  fracture of 2 ribs.     OT comments  Pt progressing towards goals, completed UB/LB bathing, grooming, LB dressing ADLs during session. Pt continues to demonstrate decreased activity tolerance, generalized weakness requiring increased time to complete tasks. Requires Min steady assist during standing ADL completion and min verbal safety cues throughout. Continue to recommend [place] after discharge to progress Pt's activity tolerance, safety and independence with ADLs and functional mobility. Will continue to follow acutely.   Follow Up Recommendations  Home health OT;Supervision/Assistance - 24 hour    Equipment Recommendations  Tub/shower seat          Precautions / Restrictions Precautions Precautions: Fall Restrictions Weight Bearing Restrictions: No       Mobility Bed Mobility               General bed mobility comments: Pt sitting up in chair   Transfers Overall transfer level: Needs assistance Equipment used: 1 person hand held assist   Sit to Stand: Min assist         General transfer comment: assist to rise and steady; verbal cues for safety     Balance Overall balance assessment: Needs assistance Sitting-balance support: Feet supported Sitting balance-Leahy Scale: Fair     Standing balance support: Bilateral upper extremity supported;Single extremity supported;During functional activity Standing balance-Leahy Scale: Fair Standing balance comment: Pt maintains static standing for pericare during bathing task with MinA/MinGuard                            ADL either performed or assessed with  clinical judgement   ADL Overall ADL's : Needs assistance/impaired     Grooming: Wash/dry face;Wash/dry hands;Oral care;Set up;Sitting Grooming Details (indicate cue type and reason): Pt requesting to complete in sitting vs standing this session due to fatigue  Upper Body Bathing: Set up;Sitting   Lower Body Bathing: Minimal assistance;Sit to/from stand Lower Body Bathing Details (indicate cue type and reason): Min steady assist in standing while Pt washes perineal area and buttocks; MinA to wash buttocks Upper Body Dressing : Set up;Sitting   Lower Body Dressing: Minimal assistance;Sit to/from stand Lower Body Dressing Details (indicate cue type and reason): Pt doffed/donned socks sitting in recliner              Functional mobility during ADLs: Minimal assistance (sit<>stand) General ADL Comments: requires increased time and verbal cues to progress through task completion; educated on safety during bathing task completion as Pt reports recent fall in tub while cleaning                Cognition Arousal/Alertness: Awake/alert Behavior During Therapy: WFL for tasks assessed/performed Overall Cognitive Status: Within Functional Limits for tasks assessed                                 General Comments: verbal safety cues during ADL task completion                          Pertinent Vitals/ Pain  Pain Assessment: Faces Faces Pain Scale: No hurt                                                          Frequency  Min 2X/week        Progress Toward Goals  OT Goals(current goals can now be found in the care plan section)  Progress towards OT goals: Progressing toward goals  Acute Rehab OT Goals Patient Stated Goal: to get back to normal  OT Goal Formulation: With patient Time For Goal Achievement: 12/13/16 Potential to Achieve Goals: Good  Plan Discharge plan remains appropriate                      AM-PAC PT "6 Clicks" Daily Activity     Outcome Measure                    End of Session Equipment Utilized During Treatment: Oxygen  OT Visit Diagnosis: Unsteadiness on feet (R26.81)   Activity Tolerance Patient tolerated treatment well   Patient Left in chair;with call bell/phone within reach;with chair alarm set   Nurse Communication Mobility status        Time: 9233-0076 OT Time Calculation (min): 45 min  Charges: OT General Charges $OT Visit: 1 Visit OT Treatments $Self Care/Home Management : 38-52 mins  Lou Cal, OT Pager [phone] 12/01/2016    Raymondo Band 12/01/2016, 12:34 PM

## 2016-12-01 NOTE — Progress Notes (Addendum)
Physical Therapy Treatment Patient Details Name: BRYAR RENNIE MRN: [id number] DOB: [birth year] Today's Date: 12/01/2016    History of Present Illness 83 y.o. female with medical history significant of afib on Eliquis; HTN; HLD; systolic CHF; anxiety; and arthritis presenting with diarrhea starting 9/28.  recent fall resulting in  fracture of 2 ribs.      PT Comments    Progress limited by continued incontinence. May need to see if diapers/depends are available on unit. Will continue to follow.  O2 sat 88% on RA during activity.  Follow Up Recommendations  Home health PT;Supervision/Assistance - 24 hour (pt declines placement)     Equipment Recommendations  None recommended by PT    Recommendations for Other Services       Precautions / Restrictions Precautions Precautions: Fall Restrictions Weight Bearing Restrictions: No    Mobility  Bed Mobility Overal bed mobility: Needs Assistance Bed Mobility: Supine to Sit;Sit to Supine     Supine to sit: Supervision;HOB elevated Sit to supine: Supervision;HOB elevated   General bed mobility comments: for safety  Transfers Overall transfer level: Needs assistance Equipment used: Straight cane Transfers: Sit to/from Stand Sit to Stand: Min assist Stand pivot transfers: Min assist       General transfer comment: assist to rise and steady; verbal cues for safety   Ambulation/Gait Ambulation/Gait assistance: Min assist Ambulation Distance (Feet): 5 Feet Assistive device: Straight cane Gait Pattern/deviations: Step-through pattern     General Gait Details: unsteady with cane use. distance limited by incontinence.    Stairs            Wheelchair Mobility    Modified Rankin (Stroke Patients Only)       Balance Overall balance assessment: Needs assistance         Standing balance support: Single extremity supported;During functional activity Standing balance-Leahy Scale: Poor                               Cognition Arousal/Alertness: Awake/alert Behavior During Therapy: WFL for tasks assessed/performed Overall Cognitive Status: Within Functional Limits for tasks assessed                                        Exercises      General Comments        Pertinent Vitals/Pain Pain Assessment: Faces Faces Pain Scale: No hurt    Home Living                      Prior Function            PT Goals (current goals can now be found in the care plan section) Progress towards PT goals: Progressing toward goals (slowly)    Frequency    Min 3X/week      PT Plan Current plan remains appropriate    Co-evaluation              AM-PAC PT "6 Clicks" Daily Activity  Outcome Measure  Difficulty turning over in bed (including adjusting bedclothes, sheets and blankets)?: None Difficulty moving from lying on back to sitting on the side of the bed? : None Difficulty sitting down on and standing up from a chair with arms (e.g., wheelchair, bedside commode, etc,.)?: Unable Help needed moving to and from a bed to chair (including a wheelchair)?: A Little Help  needed walking in hospital room?: A Little Help needed climbing 3-5 steps with a railing? : A Little 6 Click Score: 18    End of Session Equipment Utilized During Treatment: Gait belt Activity Tolerance: Patient tolerated treatment well (limited due to incontinence) Patient left: in bed;with call bell/phone within reach   PT Visit Diagnosis: Muscle weakness (generalized) (M62.81);Difficulty in walking, not elsewhere classified (R26.2)     Time: 4158-3094 PT Time Calculation (min) (ACUTE ONLY): 31 min  Charges:  $Therapeutic Activity: 23-37 mins                    G Codes:          Weston Anna, MPT Pager: [phone]

## 2016-12-01 NOTE — Care Management Important Message (Signed)
Important Message  Patient Details  Name: Cathy [name] MRN: [id number] Date of Birth: 02/05/1928   Medicare Important Message Given:  Yes    Kerin Salen 12/01/2016, 11:42 AMImportant Message  Patient Details  Name: Cathy [name] MRN: [id number] Date of Birth: 07/04/1927   Medicare Important Message Given:  Yes    Kerin Salen 12/01/2016, 11:42 AM

## 2016-12-01 NOTE — Progress Notes (Signed)
Tap water enema given PR per orders, results large bowel movement soft greenish brown stool.

## 2016-12-02 ENCOUNTER — Encounter (HOSPITAL_COMMUNITY): Payer: Self-pay | Admitting: Student

## 2016-12-02 DIAGNOSIS — I5033 Acute on chronic diastolic (congestive) heart failure: Secondary | ICD-10-CM

## 2016-12-02 LAB — BASIC METABOLIC PANEL
ANION GAP: 7 (ref 5–15)
BUN: 13 mg/dL (ref 6–20)
CHLORIDE: 98 mmol/L — AB (ref 101–111)
CO2: 32 mmol/L (ref 22–32)
Calcium: 8.4 mg/dL — ABNORMAL LOW (ref 8.9–10.3)
Creatinine, Ser: 0.76 mg/dL (ref 0.44–1.00)
GFR calc Af Amer: >60 mL/min (ref >60)
Glucose, Bld: 91 mg/dL (ref 65–99)
POTASSIUM: 3.6 mmol/L (ref 3.5–5.1)
SODIUM: 137 mmol/L (ref 135–145)

## 2016-12-02 LAB — MAGNESIUM: MAGNESIUM: 2.1 mg/dL (ref 1.7–2.4)

## 2016-12-02 LAB — TROPONIN I: Troponin I: <0.03 ng/mL (ref <0.03)

## 2016-12-02 MED ORDER — FUROSEMIDE 20 MG PO TABS
20.0000 mg | ORAL_TABLET | Freq: Every day | ORAL | Status: DC
  Administered 2016-12-03: 20 mg via ORAL
  Filled 2016-12-02: qty 1

## 2016-12-02 NOTE — Progress Notes (Signed)
Date:  December 02, 2016 Chart reviewed for concurrent status and case management needs.  Will continue to follow patient progress.  Discharge Planning: following for needs  Expected discharge date: [id number]  Rhonda Davis, BSN, RN3, CCM   [phone]  

## 2016-12-02 NOTE — Progress Notes (Signed)
Physical Therapy Treatment Patient Details Name: Cathy [name] MRN: [id number] DOB: 1927-10-31 Today's Date: 12/02/2016    SATURATION QUALIFICATIONS: (This note is used to comply with regulatory documentation for home oxygen)  Patient Saturations on Room Air at Rest = 87%  Patient Saturations on [employer] while Ambulating = NT  Patient Saturations on 2 Liters of oxygen while Ambulating = 93%      History of Present Illness 83 y.o. female with medical history significant of afib on Eliquis; HTN; HLD; systolic CHF; anxiety; and arthritis presenting with diarrhea starting 9/28.  recent fall resulting in  fracture of 2 ribs.      PT Comments    Progressing with mobility. Toileted pt prior to ambulation and used briefs/pad to manage incontinence this session.  Pt tolerated activity well.   Follow Up Recommendations  Home health PT;Supervision/Assistance - 24 hour (pt declines placement. She plans to d/c to daughter's home)     Equipment Recommendations  None recommended by PT    Recommendations for Other Services       Precautions / Restrictions Precautions Precautions: Fall Restrictions Weight Bearing Restrictions: No    Mobility  Bed Mobility               General bed mobility comments: oob in recliner  Transfers Overall transfer level: Needs assistance Equipment used: Rolling walker (2 wheeled) Transfers: Sit to/from Stand Sit to Stand: Min guard Stand pivot transfers: Min guard       General transfer comment: close guard for safety. Pt used armrests of recliner, bsc for support  Ambulation/Gait Ambulation/Gait assistance: Min assist Ambulation Distance (Feet): 75 Feet Assistive device: 4-wheeled walker Gait Pattern/deviations: Step-through pattern;Decreased stride length     General Gait Details: Assist to stabilize. Weaving noted with use of rollator. Pt uses cane at baseline. O2 sat 93% 2L [place] O2 during ambulation. She tolerated distance well.     Stairs            Wheelchair Mobility    Modified Rankin (Stroke Patients Only)       Balance Overall balance assessment: Needs assistance           Standing balance-Leahy Scale: Poor                              Cognition Arousal/Alertness: Awake/alert Behavior During Therapy: WFL for tasks assessed/performed Overall Cognitive Status: Within Functional Limits for tasks assessed                                        Exercises      General Comments        Pertinent Vitals/Pain Pain Assessment: Faces Faces Pain Scale: No hurt    Home Living                      Prior Function            PT Goals (current goals can now be found in the care plan section) Progress towards PT goals: Progressing toward goals    Frequency    Min 3X/week      PT Plan Current plan remains appropriate    Co-evaluation              AM-PAC PT "6 Clicks" Daily Activity  Outcome Measure  Difficulty turning over in bed (  including adjusting bedclothes, sheets and blankets)?: None Difficulty moving from lying on back to sitting on the side of the bed? : None Difficulty sitting down on and standing up from a chair with arms (e.g., wheelchair, bedside commode, etc,.)?: A Little Help needed moving to and from a bed to chair (including a wheelchair)?: A Little Help needed walking in hospital room?: A Little Help needed climbing 3-5 steps with a railing? : A Little 6 Click Score: 20    End of Session Equipment Utilized During Treatment: Gait belt;Oxygen Activity Tolerance: Patient tolerated treatment well Patient left: in chair;with call bell/phone within reach;with chair alarm set   PT Visit Diagnosis: Muscle weakness (generalized) (M62.81);Difficulty in walking, not elsewhere classified (R26.2)     Time: 6433-2951 PT Time Calculation (min) (ACUTE ONLY): 27 min  Charges:  $Gait Training: 8-22 mins $Therapeutic Activity:  8-22 mins                    G Codes:          Weston Anna, MPT Pager: [phone]

## 2016-12-02 NOTE — Consult Note (Signed)
Cardiology Consult    Patient ID: Cathy [name]; [id number]; [birth year]   Admit date: 11/27/2016 Date of Consult: 12/02/2016  Primary Care Provider: Biagio Borg, MD Primary Cardiologist: Dr. Tamala Julian Primary Electrophysiologist:  Dr. Curt Bears  Patient Profile    Cathy [name] is a 83 y.o. female with past medical history of chronic combined systolic and diastolic CHF (EF previously 25-30%, improved to 55-60% by echo in 07/2015), PAFlutter (s/p DCCV in 03/2015, on Eliquis), HTN, HLD, and chronic anemia who is being seen today for the evaluation of CHF and pauses at the request of Dr. Rockne Menghini.   History of Present Illness    Cathy [name] was most recently seen in office by Dr. Curt Bears on 10/26/2016 and reported having worsening dyspnea on exertion at that time. Weight was at 126 lbs at that time. She was noted to be back in atrial flutter with a HR in the 110's it was recommended she restart anticoagulation on 9/18 (had a planned spinal injection on 9/17) and following this, restart Amiodarone at 400mg  BID 3 weeks afterwards with DCCV after restarting Amiodarone.    The patient presented to [facility] ED on 11/27/2016 for evaluation of constipation and a productive cough. Reported falling a few weeks prior and suffering rib fractures at that time. CXR on admission showed pulmonary vascular congestion and a small right effusion, most consistent with CHF. BNP at 348. CT Abdomen showed a fecal impaction and a large hiatal hernia. She was noted to have a leukocytosis and started on Cipro and Flagyl. Her impaction has been treated with enemas.   She has also been started on IV Lasix 40mg  BID. I&O's have not been recorded due to incontinence. Weight was at 132 lbs on admission and at 125 lbs on 10/2 (close to baseline). Last weight on 10/3 was recorded at 157 lbs but likely inaccurate. Most recent EKG on 10/3 read as showing atrial fibrillation, HR 62, with a LBBB (new since prior tracings). Telemetry is most  consistent with rate-controlled atrial flutter with HR in the 60's. Did have a 2.3 second pause yesterday afternoon.   A repeat echo has been performed and shows an improved EF of 55-60% with no regional WMA. Grade 3 DD was noted with moderate dilation of the LA.   In talking with the patient, she reports significant improvement in her constipation following administration of multiple enemas. She reports breathing is at baseline. No orthopnea, PND or lower extremity edema. She denies any chest pain or palpitations. Reports she had started Amiodarone a few weeks prior to admission. She is still experiencing mild soreness along her right chest since her fall several weeks ago. She had been cleaning her shower at that time and lost her footing. Did not hit her head. No associated lightheadedness, dizziness, or syncope.   Past Medical History   Past Medical History:  Diagnosis Date  . Allergic rhinitis   . Anemia   . Anxiety   . Chronic systolic CHF (congestive heart failure) ([place])    a. 2D Echo 02/02/15: EF 25-30%, akinesis of mid-apical anteroseptal myocardium, grade 3 DD, mod MR, severely dilated LA, mildly reduced RV systoluc function, mod dilated RA, mod TR, mildly increased PASP 17mmHg. b. 10/2016: EF 55-60% with no regional WMA. Grade 3 DD.   [name] Colitis    lymphocytic colitis feb 2011  . Coronary artery calcification    a. By CT 01/2015.  . Diverticulosis of colon   . DJD (degenerative joint disease)  gets epidural injections  . Dysphagia   . Hiatal hernia   . Hypercholesteremia   . Hypertension   . Iron deficiency   . Irritable bowel syndrome   . Liver lesion 02/18/2015  . Lumbar back pain   . Osteoporosis   . Paroxysmal atrial flutter ([place])    a. Dx 01/2015 - underwent DCCV 03/2015; takes Eliquis  . Sinus bradycardia   . Splenic lesion 02/18/2015  . Urinary incontinence   . Venous insufficiency   . Vitamin B12 deficiency      Allergies:   Allergies  Allergen Reactions    . Amoxicillin-Pot Clavulanate Diarrhea    Has patient had a PCN reaction causing immediate rash, facial/tongue/throat swelling, SOB or lightheadedness with hypotension: no Has patient had a PCN reaction causing severe rash involving mucus membranes or skin necrosis:  no Has patient had a PCN reaction that required hospitalization: no Has patient had a PCN reaction occurring within the last 10 years no If all of the above answers are "NO", then may proceed with Cephalosporin use.   . Sulfonamide Derivatives Other (See Comments)    unknown    Home Medications:   Home Medications:  Prior to Admission medications   Medication Sig Start Date End Date Taking? Authorizing Provider  amiodarone (PACERONE) 200 MG tablet Take 1 tablet (200 mg total) by mouth daily. 11/09/16  Yes Camnitz, Will Hassell Done, MD  atorvastatin (LIPITOR) 20 MG tablet TAKE 1 TABLET DAILY AT 6 P.M. 10/01/16  Yes Biagio Borg, MD  benzonatate (TESSALON PERLES) 100 MG capsule Take 1 capsule (100 mg total) by mouth 2 (two) times daily as needed for cough. 09/30/16 09/30/17 Yes Biagio Borg, MD  calcium-vitamin D (OSCAL WITH D) 500-200 MG-UNIT per tablet Take 1 tablet by mouth daily.    Yes [provider]  cholecalciferol (VITAMIN D) 1000 UNITS tablet Take 1,000 Units by mouth daily.   Yes [provider]  ELIQUIS 2.5 MG TABS tablet TAKE 1 TABLET TWICE A DAY 08/30/16  Yes Camnitz, Will Hassell Done, MD  esomeprazole (NEXIUM) 40 MG capsule TAKE 1 CAPSULE DAILY 03/26/16  Yes Biagio Borg, MD  Ferrous Sulfate Dried (FEOSOL) 200 (65 FE) MG TABS Take 1 tablet by mouth 2 (two) times daily. Take with a vitamin c 500mg     Yes [provider]  fexofenadine (ALLEGRA) 180 MG tablet Take 180 mg by mouth daily as needed for allergies or rhinitis.   Yes [provider]  isosorbide mononitrate (IMDUR) 30 MG 24 hr tablet Take 1 tablet (30 mg total) by mouth daily. 09/22/16  Yes Camnitz, Ocie Doyne, MD   oxyCODONE-acetaminophen (PERCOCET) 5-325 MG per tablet Take 1 tablet by mouth every 6 (six) hours as needed for moderate pain.    Yes [provider]  polyethylene glycol (MIRALAX / GLYCOLAX) packet Take 17 g by mouth 2 (two) times daily. Patient taking differently: Take 17 g by mouth daily as needed for mild constipation or moderate constipation.  10/22/14  Yes Palumbo, April, MD  pregabalin (LYRICA) 50 MG capsule Take 1 capsule (50 mg total) by mouth 3 (three) times daily. 07/13/16  Yes Biagio Borg, MD  solifenacin (VESICARE) 10 MG tablet Take 1 tablet (10 mg total) by mouth daily. 06/17/14  Yes Rowe Clack, MD  vitamin B-12 (CYANOCOBALAMIN) 1000 MCG tablet Take 1 tablet (1,000 mcg total) by mouth daily. 06/17/14  Yes Rowe Clack, MD  Vitamin D, Ergocalciferol, (DRISDOL) 50000 units CAPS capsule Take 1  capsule (50,000 Units total) by mouth every 7 (seven) days. Patient not taking: Reported on 11/27/2016 05/27/16   Lyndal Pulley, DO    Inpatient Medications    Scheduled Meds: . apixaban  2.5 mg Oral BID  . darifenacin  15 mg Oral Daily  . docusate sodium  100 mg Oral BID  . furosemide  40 mg Intravenous BID  . hydrocortisone cream   Topical BID  . isosorbide mononitrate  30 mg Oral Daily  . lidocaine  1 patch Transdermal Q24H  . pantoprazole  40 mg Oral Daily  . polyethylene glycol  17 g Oral Daily  . pregabalin  50 mg Oral TID  . senna  1 tablet Oral Daily   Continuous Infusions:  PRN Meds: guaiFENesin-dextromethorphan, morphine injection, ondansetron **OR** ondansetron (ZOFRAN) IV  Family History    Family History  Problem Relation Age of Onset  . Heart attack Mother   . Heart attack Brother   . Diabetes Brother   . Hypertension Neg Hx   . Fainting Neg Hx   . Stroke Neg Hx     Social History    Social History   Social History  . Marital status: Widowed    Spouse name: N/A  . Number of children: N/A  . Years of education: N/A    Occupational History  . retired    Social History Main Topics  . Smoking status: Former Smoker    Packs/day: 0.50    Years: 10.00    Types: Cigarettes  . Smokeless tobacco: Never Used  . Alcohol use No  . Drug use: No  . Sexual activity: No   Other Topics Concern  . Not on file   Social History Narrative  . No narrative on file     Review of Systems    General:  No chills, fever, night sweats or weight changes.  Cardiovascular:  No chest pain, edema, orthopnea, palpitations, paroxysmal nocturnal dyspnea. Positive for dyspnea on exertion.  Dermatological: No rash, lesions/masses Respiratory: No cough, dyspnea Urologic: No hematuria, dysuria Abdominal:   No nausea, vomiting, diarrhea, bright red blood per rectum, melena, or hematemesis. Positive for constipation.  Neurologic:  No visual changes, wkns, changes in mental status.  All other systems reviewed and are otherwise negative except as noted above.  Physical Exam/Data    Blood pressure 137/80, pulse 60, temperature (!) 97.5 F (36.4 C), temperature source Oral, resp. rate 18, height 4\' 9"  (1.448 m), weight 157 lb (71.2 kg), SpO2 93 %.  General: Pleasant, elderly Caucasian female appearing in NAD Psych: Normal affect. Neuro: Alert and oriented X 3. Moves all extremities spontaneously. HEENT: Normal  Neck: Supple without bruits or JVD. Lungs:  Resp regular and unlabored, CTA without wheezing or rales. Heart: Irregularly irregular, no s3, s4, or murmurs. Abdomen: Soft, non-tender, non-distended, BS + x 4.  Extremities: No clubbing, cyanosis or lower extremity edema. DP/PT/Radials 2+ and equal bilaterally.  Telemetry:  Telemetry was personally reviewed and demonstrates:  Atrial flutter, HR in 60's.    Labs/Studies     Relevant CV Studies:  Echocardiogram: 11/28/2016 Study Conclusions  - Left ventricle: The cavity size was normal. Wall thickness was   increased in a pattern of moderate LVH. Systolic  function was   normal. The estimated ejection fraction was in the range of 55%   to 60%. Wall motion was normal; there were no regional wall   motion abnormalities. Doppler parameters are consistent with a   reversible restrictive pattern, indicative  of decreased left   ventricular diastolic compliance and/or increased left atrial   pressure (grade 3 diastolic dysfunction). - Aortic valve: Mildly calcified annulus. Trileaflet; normal   thickness leaflets. - Mitral valve: Moderately calcified annulus. - Left atrium: The atrium was mildly to moderately dilated. - Right atrium: The atrium was mildly dilated. - Pulmonary arteries: Systolic pressure was mildly increased. PA   peak pressure: 39 mm Hg (S).  Laboratory Data:  Chemistry  Recent Labs Lab 12/01/16 0613 12/01/16 1426 12/02/16 0147  NA 137 134* 137  K 3.5 3.6 3.6  CL 99* 97* 98*  CO2 31 31 32  GLUCOSE 91 113* 91  BUN 14 15 13   CREATININE 0.73 0.78 0.76  CALCIUM 8.5* 8.2* 8.4*  GFRNONAA >60 >60 >60  GFRAA >60 >60 >60  ANIONGAP 7 6 7      Recent Labs Lab 11/29/16 0631 11/30/16 0614 12/01/16 0613  PROT 5.1* 5.2* 5.6*  ALBUMIN 2.8* 2.8* 2.9*  AST 233* 100* 56*  ALT 282* 178* 128*  ALKPHOS 228* 191* 171*  BILITOT 0.7 0.6 0.6   Hematology  Recent Labs Lab 11/29/16 0631 11/30/16 0614 12/01/16 0613  WBC 13.8* 10.6* 10.2  RBC 3.75* 3.78* 4.16  HGB 11.2* 11.4* 12.3  HCT 34.2* 35.0* 37.3  MCV 91.2 92.6 89.7  MCH 29.9 30.2 29.6  MCHC 32.7 32.6 33.0  RDW 14.1 14.0 13.6  PLT 200 196 235   Cardiac Enzymes  Recent Labs Lab 12/01/16 1426 12/01/16 2018 12/02/16 0147  TROPONINI <0.03 <0.03 <0.03   No results for input(s): TROPIPOC in the last 168 hours.  BNP  Recent Labs Lab 11/27/16 2323  BNP 348.3*    DDimer No results for input(s): DDIMER in the last 168 hours.  Radiology/Studies:  Dg Chest Right Decubitus  Result Date: 11/29/2016 CLINICAL DATA:  Right pleural effusion EXAM: CHEST - RIGHT  DECUBITUS COMPARISON:  PA and lateral chest radiographs November 27, 2016 FINDINGS: Right lateral decubitus image shows the moderate free-flowing pleural effusion on the right. There is bibasilar atelectasis. Lungs elsewhere appear clear. Heart is mildly enlarged. Pulmonary vascularity appears within normal limits. No adenopathy evident. IMPRESSION: Moderate free-flowing right pleural effusion. Bibasilar atelectasis. Mild cardiac prominence, stable. Electronically Signed   By: Lowella Grip III M.D.   On: 11/29/2016 09:24   Dg Abd Acute W/chest  Result Date: 12/01/2016 CLINICAL DATA:  Shortness of breath.  Abdominal pain. EXAM: DG ABDOMEN ACUTE W/ 1V CHEST COMPARISON:  Radiograph of November 29, 2016. FINDINGS: Stable cardiomegaly. Atherosclerosis of thoracic aorta is noted. No pneumothorax is noted. Moderate right pleural effusion is noted with probable underlying atelectasis or infiltrate. Mild left basilar subsegmental atelectasis is noted. No pneumoperitoneum is noted. No abnormal bowel dilatation is noted. Phleboliths are noted in the pelvis. Status post cholecystectomy. IMPRESSION: Moderate right pleural effusion with probable underlying atelectasis or infiltrate. Aortic atherosclerosis. No abnormal bowel dilatation is noted. Electronically Signed   By: Marijo Conception, M.D.   On: 12/01/2016 15:59   Dg Abd Portable 1v  Result Date: 11/29/2016 CLINICAL DATA:  Elevated liver function studies EXAM: PORTABLE ABDOMEN - 1 VIEW COMPARISON:  CT scan of the abdomen and pelvis of November 27, 2016 FINDINGS: The colonic stool burden remains moderately increased. No small or large bowel obstructive pattern is observed. There is a small amount of rectal stool present. There surgical clips in the gallbladder fossa. There is moderate levocurvature of the lumbar spine centered at L4. IMPRESSION: Moderately increased colonic stool burden persists. No  evidence of obstruction. Electronically Signed   By: David   Martinique M.D.   On: 11/29/2016 09:01   US Abdomen Limited Ruq  Result Date: 11/29/2016 CLINICAL DATA:  Elevated liver function studies. History of previous cholecystectomy. EXAM: ULTRASOUND ABDOMEN LIMITED RIGHT UPPER QUADRANT COMPARISON:  Abdominal and pelvic CT scan of November 27, 2016 and abdominal and pelvic CT scan of January 31, 2015. FINDINGS: Gallbladder: The gallbladder surgically absent. Common bile duct: Diameter: 16.4 mm which is markedly dilated but which has been previously demonstrated. No intraluminal stones or sludge are observed. Liver: There is intrahepatic ductal dilation which is not a new finding. In the left lobe there is a hyperechoic focus measuring approximately 1 cm in diameter most compatible with a hemangioma. The surface contour of the liver is smooth. Portal vein is patent on color Doppler imaging with normal direction of blood flow towards the liver. There is a right pleural effusion. IMPRESSION: Chronic dilation of the intra- and extra-hepatic bile ducts. Previous cholecystectomy. Probable hemangioma in the left hepatic lobe measuring 1 cm in diameter which is been previously demonstrated. Electronically Signed   By: David  Martinique M.D.   On: 11/29/2016 15:08     Assessment & Plan    1. Acute on Chronic Diastolic CHF - the patient has a history of what is thought to be most consistent with tachycardia-induced cardiomyopathy with EF at 25-30% by echo in 2016, improved to 55-60% by echo in 07/2015. - she presented with constipation but also reported a productive cough and dyspnea. CXR on admission showed pulmonary vascular congestion and a small right effusion, most consistent with CHF. BNP at 348. Repeat echo shows a preserved EF of 55-60% with Grade 3 DD.  - she has been started on IV Lasix 40mg  BID. I&O's have not been recorded due to incontinence. Weight was at 132 lbs on admission and at 125 lbs on 10/2 (close to baseline). Last weight on 10/3 was recorded at 157 lbs  but likely inaccurate.  - she does not appear volume overloaded by physical examination. Will recheck weight today. Can likely discontinue Lasix later today or tomorrow.   2. Paroxysmal Atrial Flutter - This patients CHA2DS2-VASc Score and unadjusted Ischemic Stroke Rate (% per year) is equal to 7.2 % stroke rate/year from a score of 5 (CHF, HTN, Female, Age (2)). Continue Eliquis 2.5mg  BID (appropriate dosing secondary to age and weight < 60 kg). She denies any evidence of active bleeding and Hgb remains stable at 12.3. She does note a recent mechanical fall but did not hit her head. If falls become a recurrent issue, will need to further address risks and benefits of anticoagulation with the patient and her family.  - she had been on Amiodarone prior to admission with plans for a DCCV as an outpatient but had supposedly gone into NSR during the interim. In reviewing telemetry, it appears she has been in rate-controlled flutter/fibrillation with HR in the 60's. Amiodarone has been held due to not knowing if she had been taking this at home and elevated LFT's (AST 233 and ALT 282 on 11/29/2016, now trending down). LFT's WNL on admission. Once LFT's normalize, would be reasonable to resume Amiodarone at 200mg  daily with plans for follow-up with EP in the outpatient setting to address further rate vs. rhythm control. Not on BB or CCB due to history of hypotension.   3. Coronary Calcifications/ New LBBB - noted on prior imaging. No catheterization has been performed due to her asymptomatic state  and advanced age.  - EKG on 10/3 read as showing atrial fibrillation, HR 62, with a LBBB (new since prior tracings). Cyclic troponin values have been negative.  - repeat echo this admission shows a preserved EF of 55-60% and she denies any recent anginal symptoms, therefore would not pursue further ischemic evaluation at this time.   4. Constipation/ Proctitis - per admitting team.   Signed, Erma Heritage,  PA-C 12/02/2016, 11:42 AM Pager: [phone]  I have seen and examined the patient along with Erma Heritage, PA-C.  I have reviewed the chart, notes and new data.  I agree with PA's note.  Key new complaints: cough and dyspnea resolved with diuresis Key examination changes: weight back to baseline, no overt hypervolemia on exam Key new findings / data: abnormal LFTs are improving rapidly (hepatic congestion favored over side effect of Amiodarone). "grade 3 diastolic dysfunction" and elevated filling pressures are not accurate assessments in a patient in atrial flutter - impedes assessment of diastolic function. Asymptomatic pauses of 2.3" are not significant during atrial fibrillation. Nonspecific IVCD is seen.  PLAN: Restart lower dose amiodarone 200 mg daily. Switch furosemide to PO. Advise daily weight monitoring and call Cardiologist for weight gain of 3 lb/24h or >5 lb from current weight. Should be soon ready for the planned elective DCCV. Amiodarone was started about 4 weeks ago  Sanda Klein, MD, [place] [phone] 12/02/2016, 2:14 PM

## 2016-12-02 NOTE — Progress Notes (Signed)
Progress Note    Cathy [name]  UUV:[id number] DOB: [birth year]  DOA: 11/27/2016 PCP: Biagio Borg, MD    Brief Narrative:   Chief complaint: Follow-up diarrhea  Medical records reviewed and are as summarized below:  Cathy [name] is an 83 y.o. female with history of hypertension, atrial fibrillation on Apixaban, chronic diastolic CHF, HIV, anxiety admitted to the hospital 11/27/16 for evaluation of diarrhea/constipation and pleural effusion.  Assessment/Plan:   Principal Problem: Diarrhea with history of severe constipation Proctitis, history of microscopic colitis Patient is thought to have diarrhea around her severe constipation, had good results with tap water enema given last night. Continue bowel regimen. C. difficile/GI pathogen panel negative. Repeat abdominal films negative for bowel dilatation.  Active problems Right-sided pleural effusion Chronic diastolic CHF Known 3 diastolic dysfunction on the echocardiogram. Has been diuresed and is currently stable. Follow-up chest x-ray 12/01/16 showed a moderate right pleural effusion with underlying atelectasis versus infiltrate. She has diuresed and is close to her dry weight and 125 pounds. She was 132 pounds on admission. Cardiology now following and recommends transition to oral Lasix.  History of atrial flutter and afibrillation/new left bundle branch block CHA2DS2-VASc Score is 5. Continue Eliquis 2.5mg  bid. Per cardiology notes: "Once LFT's normalize, would be reasonable to resume Amiodarone at 200mg  daily with plans for follow-up with EP in the outpatient setting to address further rate vs. rhythm control. Not on BB or CCB due to history of hypotension." Cardiology does not recommend a further ischemic evaluation and troponins here have been negative. She denies chest pain.  Rib fracture Pain control, lidocaine patch.  Generalized weakness Evaluated by PT. Will need 24-hour supervision. Declines SNF placement. Will  need home health PT.  Transaminitis Improving but have not yet normalized at this time. Ultrasound shows hemangioma which is stable.  Splenic lesion Slightly and enlarged from previous scan. Follow-up with outpatient PCP.  Adrenal mass on the left side Appears to be stable from previous scan. Follow-up with outpatient PCP.  Obesity Body mass index is 33.97 kg/m.   Family Communication/Anticipated D/C date and plan/Code Status   DVT prophylaxis: Eliquis ordered. Code Status: DO NOT RESUSCITATE  Family Communication: No family currently at the bedside. Disposition Plan: Home in the next 24 hours.   Medical Consultants:    Cardiology   Anti-Infectives:    None  Subjective:   Patient reports a diminished appetite but denies abdominal chest pain. She has not had any nausea or vomiting. Reports some loose stools and feels weak in general.  Objective:    Vitals:   12/01/16 0610 12/01/16 1347 12/02/16 0026 12/02/16 0623  BP: (!) 145/85 (!) 117/59 128/70 137/80  Pulse: 60 63 (!) 59 60  Resp: 18 18 18 18   Temp: 98.4 F (36.9 C) (!) 97.5 F (36.4 C) 97.7 F (36.5 C) (!) 97.5 F (36.4 C)  TempSrc: Oral Oral Oral Oral  SpO2: 93% 92% 93% 93%  Weight: 71.2 kg (157 lb)     Height:        Intake/Output Summary (Last 24 hours) at 12/02/16 0831 Last data filed at 12/02/16 0605  Gross per 24 hour  Intake              730 ml  Output              100 ml  Net              630 ml   [employer]  11/29/16 0552 11/30/16 0508 12/01/16 0610  Weight: 58.9 kg (129 lb 13.6 oz) 56.8 kg (125 lb 3.2 oz) 71.2 kg (157 lb)    Exam: General: No acute distress. Cardiovascular: Heart sounds are irregular. No gallops or rubs. No murmurs. No JVD. Lungs: Clear to auscultation bilaterally with good air movement. No rales, rhonchi or wheezes. Abdomen: Soft, nontender, nondistended with normal active bowel sounds. No masses. No hepatosplenomegaly. Neurological: Alert and oriented  3. Moves all extremities 4 with equal strength. Cranial nerves II through XII grossly intact. Skin: Warm and dry. No rashes or lesions. Extremities: No clubbing or cyanosis. No edema. Pedal pulses 2+. Joint deformities consistent with arthritis. Psychiatric: Mood and affect are normal. Insight and judgment are fair.   Data Reviewed:   I have personally reviewed following labs and imaging studies:  Labs: Labs show the following:   Basic Metabolic Panel:  Recent Labs Lab 11/29/16 0631 11/30/16 0614 12/01/16 0613 12/01/16 1426 12/02/16 0147  NA 140 137 137 134* 137  K 3.6 3.6 3.5 3.6 3.6  CL 102 101 99* 97* 98*  CO2 28 29 31 31  32  GLUCOSE 80 85 91 113* 91  BUN 16 14 14 15 13   CREATININE 0.82 0.77 0.73 0.78 0.76  CALCIUM 8.7* 8.4* 8.5* 8.2* 8.4*  MG 1.9 1.8 1.8 1.7 2.1   GFR Estimated Creatinine Clearance: 40.4 mL/min (by C-G formula based on SCr of 0.76 mg/dL). Liver Function Tests:  Recent Labs Lab 11/27/16 1851 11/29/16 0631 11/30/16 0614 12/01/16 0613  AST 32 233* 100* 56*  ALT 35 282* 178* 128*  ALKPHOS 94 228* 191* 171*  BILITOT 0.9 0.7 0.6 0.6  PROT 5.9* 5.1* 5.2* 5.6*  ALBUMIN 3.1* 2.8* 2.8* 2.9*    Recent Labs Lab 11/27/16 1851  LIPASE 20   Coagulation profile  Recent Labs Lab 11/29/16 0631  INR 1.54    CBC:  Recent Labs Lab 11/27/16 1851 11/27/16 1906 11/28/16 0458 11/29/16 0631 11/30/16 0614 12/01/16 0613  WBC 18.6*  --  18.9* 13.8* 10.6* 10.2  NEUTROABS 16.0*  --   --  11.0* 8.3* 7.9*  HGB 12.2 10.5* 12.9 11.2* 11.4* 12.3  HCT 37.6 31.0* 39.0 34.2* 35.0* 37.3  MCV 93.5  --  92.2 91.2 92.6 89.7  PLT 190  --  200 200 196 235   Cardiac Enzymes:  Recent Labs Lab 12/01/16 1426 12/01/16 2018 12/02/16 0147  TROPONINI <0.03 <0.03 <0.03   Thyroid function studies:  Recent Labs  12/01/16 1426  TSH 3.204   Sepsis Labs:  Recent Labs Lab 11/28/16 0458 11/29/16 0631 11/30/16 0614 12/01/16 0613  WBC 18.9* 13.8* 10.6*  10.2  LATICACIDVEN  --  0.8  --   --     Microbiology Recent Results (from the past 240 hour(s))  Gastrointestinal Panel by PCR , Stool     Status: None   Collection Time: 11/27/16  6:54 PM  Result Value Ref Range Status   Campylobacter species NOT DETECTED NOT DETECTED Final   Plesimonas shigelloides NOT DETECTED NOT DETECTED Final   Salmonella species NOT DETECTED NOT DETECTED Final   Yersinia enterocolitica NOT DETECTED NOT DETECTED Final   Vibrio species NOT DETECTED NOT DETECTED Final   Vibrio cholerae NOT DETECTED NOT DETECTED Final   Enteroaggregative E coli (EAEC) NOT DETECTED NOT DETECTED Final   Enteropathogenic E coli (EPEC) NOT DETECTED NOT DETECTED Final   Enterotoxigenic E coli (ETEC) NOT DETECTED NOT DETECTED Final   Shiga like toxin producing E coli (  STEC) NOT DETECTED NOT DETECTED Final   Shigella/Enteroinvasive E coli (EIEC) NOT DETECTED NOT DETECTED Final   Cryptosporidium NOT DETECTED NOT DETECTED Final   Cyclospora cayetanensis NOT DETECTED NOT DETECTED Final   Entamoeba histolytica NOT DETECTED NOT DETECTED Final   Giardia lamblia NOT DETECTED NOT DETECTED Final   Adenovirus F40/41 NOT DETECTED NOT DETECTED Final   Astrovirus NOT DETECTED NOT DETECTED Final   Norovirus GI/GII NOT DETECTED NOT DETECTED Final   Rotavirus A NOT DETECTED NOT DETECTED Final   Sapovirus (I, II, IV, and V) NOT DETECTED NOT DETECTED Final  C difficile quick scan w PCR reflex     Status: None   Collection Time: 11/27/16  6:54 PM  Result Value Ref Range Status   C Diff antigen NEGATIVE NEGATIVE Final   C Diff toxin NEGATIVE NEGATIVE Final   C Diff interpretation No C. difficile detected.  Final    Procedures and diagnostic studies:  Dg Abd Acute W/chest  Result Date: 12/01/2016 CLINICAL DATA:  Shortness of breath.  Abdominal pain. EXAM: DG ABDOMEN ACUTE W/ 1V CHEST COMPARISON:  Radiograph of November 29, 2016. FINDINGS: Stable cardiomegaly. Atherosclerosis of thoracic aorta is  noted. No pneumothorax is noted. Moderate right pleural effusion is noted with probable underlying atelectasis or infiltrate. Mild left basilar subsegmental atelectasis is noted. No pneumoperitoneum is noted. No abnormal bowel dilatation is noted. Phleboliths are noted in the pelvis. Status post cholecystectomy. IMPRESSION: Moderate right pleural effusion with probable underlying atelectasis or infiltrate. Aortic atherosclerosis. No abnormal bowel dilatation is noted. Electronically Signed   By: Marijo Conception, M.D.   On: 12/01/2016 15:59    Medications:   . apixaban  2.5 mg Oral BID  . darifenacin  15 mg Oral Daily  . docusate sodium  100 mg Oral BID  . furosemide  40 mg Intravenous BID  . hydrocortisone cream   Topical BID  . isosorbide mononitrate  30 mg Oral Daily  . lidocaine  1 patch Transdermal Q24H  . pantoprazole  40 mg Oral Daily  . polyethylene glycol  17 g Oral Daily  . pregabalin  50 mg Oral TID  . senna  1 tablet Oral Daily   Continuous Infusions:   LOS: 5 days   Danyah Guastella  [facility] Pager [phone]. If unable to reach me by pager, please call my cell phone at [phone].  *Please refer to [email or web address], password [email or web address] to get updated schedule on who will round on this patient, as hospitalists switch teams weekly. If 7PM-7AM, please contact night-coverage at [email or web address], password [email or web address] for any overnight needs.  12/02/2016, 8:31 AM

## 2016-12-03 DIAGNOSIS — I4892 Unspecified atrial flutter: Secondary | ICD-10-CM

## 2016-12-03 DIAGNOSIS — J90 Pleural effusion, not elsewhere classified: Secondary | ICD-10-CM

## 2016-12-03 DIAGNOSIS — R627 Adult failure to thrive: Secondary | ICD-10-CM

## 2016-12-03 DIAGNOSIS — I5042 Chronic combined systolic (congestive) and diastolic (congestive) heart failure: Secondary | ICD-10-CM

## 2016-12-03 DIAGNOSIS — R945 Abnormal results of liver function studies: Secondary | ICD-10-CM

## 2016-12-03 DIAGNOSIS — R197 Diarrhea, unspecified: Secondary | ICD-10-CM

## 2016-12-03 LAB — BASIC METABOLIC PANEL
ANION GAP: 8 (ref 5–15)
BUN: 13 mg/dL (ref 6–20)
CALCIUM: 8.8 mg/dL — AB (ref 8.9–10.3)
CHLORIDE: 96 mmol/L — AB (ref 101–111)
CO2: 32 mmol/L (ref 22–32)
Creatinine, Ser: 0.76 mg/dL (ref 0.44–1.00)
GFR calc non Af Amer: >60 mL/min (ref >60)
Glucose, Bld: 86 mg/dL (ref 65–99)
Potassium: 3.4 mmol/L — ABNORMAL LOW (ref 3.5–5.1)
Sodium: 136 mmol/L (ref 135–145)

## 2016-12-03 LAB — MAGNESIUM: MAGNESIUM: 1.9 mg/dL (ref 1.7–2.4)

## 2016-12-03 MED ORDER — FUROSEMIDE 20 MG PO TABS: 20.0000 mg | ORAL_TABLET | Freq: Every day | ORAL | 0 refills | Status: DC

## 2016-12-03 MED ORDER — SENNOSIDES-DOCUSATE SODIUM 8.6-50 MG PO TABS: 1.0000 | ORAL_TABLET | Freq: Every day | ORAL | 0 refills | Status: DC

## 2016-12-03 MED ORDER — AMIODARONE HCL 200 MG PO TABS
200.0000 mg | ORAL_TABLET | Freq: Every day | ORAL | Status: DC
  Administered 2016-12-03: 200 mg via ORAL
  Filled 2016-12-03: qty 1

## 2016-12-03 MED ORDER — FERROUS SULFATE DRIED 200 (65 FE) MG PO TABS: 1.0000 | ORAL_TABLET | ORAL | 0 refills | Status: DC

## 2016-12-03 MED ORDER — POTASSIUM CHLORIDE CRYS ER 20 MEQ PO TBCR
40.0000 meq | EXTENDED_RELEASE_TABLET | Freq: Once | ORAL | Status: AC
  Administered 2016-12-03: 40 meq via ORAL
  Filled 2016-12-03: qty 2

## 2016-12-03 NOTE — Progress Notes (Signed)
Progress Note  Patient Name: Cathy [name] Date of Encounter: 12/03/2016  Primary Cardiologist: Dr. Tamala Julian Primary Electrophysiologist:  Dr. Curt Bears  Subjective   Breathing improved. No chest pain or palpitations. Reports constipation is now resolved.   Inpatient Medications    Scheduled Meds: . apixaban  2.5 mg Oral BID  . darifenacin  15 mg Oral Daily  . docusate sodium  100 mg Oral BID  . furosemide  20 mg Oral Daily  . hydrocortisone cream   Topical BID  . isosorbide mononitrate  30 mg Oral Daily  . lidocaine  1 patch Transdermal Q24H  . pantoprazole  40 mg Oral Daily  . polyethylene glycol  17 g Oral Daily  . pregabalin  50 mg Oral TID  . senna  1 tablet Oral Daily   Continuous Infusions:  PRN Meds: guaiFENesin-dextromethorphan, morphine injection, ondansetron **OR** ondansetron (ZOFRAN) IV   Vital Signs    Vitals:   12/02/16 1522 12/02/16 2201 12/03/16 0415 12/03/16 0542  BP:  129/75  (!) 146/81  Pulse:  62  (!) 59  Resp:  18  17  Temp:  97.9 F (36.6 C)  97.8 F (36.6 C)  TempSrc:  Oral  Oral  SpO2:  97%  98%  Weight: 117 lb (53.1 kg)  119 lb 9.6 oz (54.3 kg)   Height:        Intake/Output Summary (Last 24 hours) at 12/03/16 0801 Last data filed at 12/03/16 0743  Gross per 24 hour  Intake              520 ml  Output                0 ml  Net              520 ml   Filed Weights   12/01/16 0610 12/02/16 1522 12/03/16 0415  Weight: 157 lb (71.2 kg) 117 lb (53.1 kg) 119 lb 9.6 oz (54.3 kg)    Telemetry    Atrial flutter, HR in mid-50's to 60's.  - Personally Reviewed  ECG    No new tracings.   Physical Exam   General: Well developed, elderly Caucasian female appearing in no acute distress. Head: Normocephalic, atraumatic.  Neck: Supple without bruits, JVD not elevated. Lungs:  Resp regular and unlabored, CTA without wheezing or rales. Heart: Irregularly irregular, S1, S2, no S3, S4, or murmur; no rub. Abdomen: Soft, non-tender,  non-distended with normoactive bowel sounds. No hepatomegaly. No rebound/guarding. No obvious abdominal masses. Extremities: No clubbing, cyanosis, or lower extremity edema. Distal pedal pulses are 2+ bilaterally. Neuro: Alert and oriented X 3. Moves all extremities spontaneously. Psych: Normal affect.  Labs    Chemistry Recent Labs Lab 11/29/16 0631 11/30/16 0614 12/01/16 0613 12/01/16 1426 12/02/16 0147 12/03/16 0518  NA 140 137 137 134* 137 136  K 3.6 3.6 3.5 3.6 3.6 3.4*  CL 102 101 99* 97* 98* 96*  CO2 28 29 31 31  32 32  GLUCOSE 80 85 91 113* 91 86  BUN 16 14 14 15 13 13   CREATININE 0.82 0.77 0.73 0.78 0.76 0.76  CALCIUM 8.7* 8.4* 8.5* 8.2* 8.4* 8.8*  PROT 5.1* 5.2* 5.6*  --   --   --   ALBUMIN 2.8* 2.8* 2.9*  --   --   --   AST 233* 100* 56*  --   --   --   ALT 282* 178* 128*  --   --   --  ALKPHOS 228* 191* 171*  --   --   --   BILITOT 0.7 0.6 0.6  --   --   --   GFRNONAA >60 >60 >60 >60 >60 >60  GFRAA >60 >60 >60 >60 >60 >60  ANIONGAP 10 7 7 6 7 8      Hematology Recent Labs Lab 11/29/16 0631 11/30/16 0614 12/01/16 0613  WBC 13.8* 10.6* 10.2  RBC 3.75* 3.78* 4.16  HGB 11.2* 11.4* 12.3  HCT 34.2* 35.0* 37.3  MCV 91.2 92.6 89.7  MCH 29.9 30.2 29.6  MCHC 32.7 32.6 33.0  RDW 14.1 14.0 13.6  PLT 200 196 235    Cardiac Enzymes Recent Labs Lab 12/01/16 1426 12/01/16 2018 12/02/16 0147  TROPONINI <0.03 <0.03 <0.03   No results for input(s): TROPIPOC in the last 168 hours.   BNP Recent Labs Lab 11/27/16 2323  BNP 348.3*     DDimer No results for input(s): DDIMER in the last 168 hours.   Radiology    Dg Abd Acute W/chest  Result Date: 12/01/2016 CLINICAL DATA:  Shortness of breath.  Abdominal pain. EXAM: DG ABDOMEN ACUTE W/ 1V CHEST COMPARISON:  Radiograph of November 29, 2016. FINDINGS: Stable cardiomegaly. Atherosclerosis of thoracic aorta is noted. No pneumothorax is noted. Moderate right pleural effusion is noted with probable underlying  atelectasis or infiltrate. Mild left basilar subsegmental atelectasis is noted. No pneumoperitoneum is noted. No abnormal bowel dilatation is noted. Phleboliths are noted in the pelvis. Status post cholecystectomy. IMPRESSION: Moderate right pleural effusion with probable underlying atelectasis or infiltrate. Aortic atherosclerosis. No abnormal bowel dilatation is noted. Electronically Signed   By: Marijo Conception, M.D.   On: 12/01/2016 15:59    Cardiac Studies   Echocardiogram: 11/28/2016 Study Conclusions  - Left ventricle: The cavity size was normal. Wall thickness was   increased in a pattern of moderate LVH. Systolic function was   normal. The estimated ejection fraction was in the range of 55%   to 60%. Wall motion was normal; there were no regional wall   motion abnormalities. Doppler parameters are consistent with a   reversible restrictive pattern, indicative of decreased left   ventricular diastolic compliance and/or increased left atrial   pressure (grade 3 diastolic dysfunction). - Aortic valve: Mildly calcified annulus. Trileaflet; normal   thickness leaflets. - Mitral valve: Moderately calcified annulus. - Left atrium: The atrium was mildly to moderately dilated. - Right atrium: The atrium was mildly dilated. - Pulmonary arteries: Systolic pressure was mildly increased. PA   peak pressure: 39 mm Hg (S).  Patient Profile     83 y.o. female w/ PMH of chronic combined systolic and diastolic CHF (EF previously 25-30%, improved to 55-60% by echo in 07/2015), PAFlutter (s/p DCCV in 03/2015, on Eliquis), HTN, HLD, and chronic anemia who was admitted to [facility] on 11/27/2016 for constipation and CHF.   Assessment & Plan    1. Acute on Chronic Diastolic CHF - the patient has a history of what is thought to be most consistent with tachycardia-induced cardiomyopathy with EF at 25-30% by echo in 2016, improved to 55-60% by echo in 07/2015. - she presented with constipation but also  reported a productive cough and dyspnea. CXR on admission showed pulmonary vascular congestion and a small right effusion, most consistent with CHF. BNP at 348. Repeat echo shows a preserved EF of 55-60% with Grade 3 DD.  - she had been started on IV Lasix 40mg  BID. I&O's have not been recorded due to  incontinence. Weight was at 132 lbs on admission and at 125 lbs on 10/2 (close to baseline). Weight down to 119 lbs today. IV Lasix has been discontinued with her being switched to PO Lasix 20mg  daily.   2. Paroxysmal Atrial Flutter - This patients CHA2DS2-VASc Score and unadjusted Ischemic Stroke Rate (% per year) is equal to 7.2 % stroke rate/year from a score of 5 (CHF, HTN, Female, Age (2)). Continue Eliquis 2.5mg  BID (appropriate dosing secondary to age and weight < 60 kg). She denies any evidence of active bleeding and Hgb remains stable at 12.3. She does note a recent mechanical fall but did not hit her head. If falls become a recurrent issue, will need to further address risks and benefits of anticoagulation with the patient and her family.  - she had been on Amiodarone prior to admission with plans for a DCCV as an outpatient but had supposedly gone into NSR during the interim. She has been in atrial flutter this admission with HR in the 50's to 60's.  Amiodarone was held on admission due to not knowing if she had been taking this at home and elevated LFT's (AST 233 and ALT 282 on 11/29/2016, now trending down). Transaminitis thought to be due to hepatic congestion due to rapid improvement. LFT's WNL on admission.  - will plan to restart Amiodarone at 200mg  daily as she has already undergone a loading dose PTA. Have sent a staff message to help arrange for EP follow-up and further discuss DCCV. Not on BB or CCB due to history of hypotension.   3. Coronary Calcifications/ New LBBB - noted on prior imaging. No catheterization has been performed due to her asymptomatic state and advanced age.  - EKG on  10/3 read as showing atrial fibrillation, HR 62, with a LBBB (new since prior tracings). Cyclic troponin values have been negative.  - repeat echo this admission shows a preserved EF of 55-60% and she denies any recent anginal symptoms, therefore would not pursue further ischemic evaluation at this time.   4. Constipation/ Proctitis - per admitting team.   5. Hypokalemia - K+ 3.4. Will replace.   Arna Medici , PA-C 8:01 AM 12/03/2016 Pager: [phone]  I have seen and examined the patient along with Erma Heritage , PA-C.  I have reviewed the chart, notes and new data.  I agree with PA's note.  Key new complaints: feels back to baseline Key examination changes: irregular rhythm, no signs of hypervolemia Key new findings / data: K 3.4  PLAN: Plan to DC on oral diuretic  - furosemide 20 mg daily and amiodarone 200 mg daily with early follow up and discussion of cardioversion. Please call Cardiology back for any new problems over the weekend.  Sanda Klein, MD, [place] [phone] 12/03/2016, 11:32 AM

## 2016-12-03 NOTE — Discharge Summary (Signed)
Discharge Summary  Cathy [name] AYT:[id number] DOB: [birth year]  PCP: Biagio Borg, MD  Admit date: 11/27/2016 Discharge date: 12/03/2016  Time spent: >29mins, more than 50% time spent on coordination of care  Recommendations for Outpatient Follow-up:  1. F/u with PMD within a week  for hospital discharge follow up, repeat cbc/bmp at follow up 2. F/u with cardiology EP to consider DCCV.   Discharge Diagnoses:  Active Hospital Problems   Diagnosis Date Noted  . Diarrhea 11/27/2016  . Rib fractures 11/28/2016  . CHF exacerbation ([place]) 11/28/2016  . Chronic combined systolic and diastolic CHF (congestive heart failure) ([place]) 03/17/2015  . Atrial flutter ([place]) 02/02/2015    Resolved Hospital Problems   Diagnosis Date Noted Date Resolved  No resolved problems to display.    Discharge Condition: stable  Diet recommendation: heart healthy  Filed Weights   12/01/16 0610 12/02/16 1522 12/03/16 0415  Weight: 71.2 kg (157 lb) 53.1 kg (117 lb) 54.3 kg (119 lb 9.6 oz)    History of present illness:  PCP: Biagio Borg, MD Consultants:  Baird Kay - cardiology; Bartko - chronic pain; Tamala Julian - orthopedics Patient coming from:  Home - lives alone; NOK: Daughter, [phone]; Pandora Leiter, [phone]  Chief Complaint: diarrhea  HPI: Cathy [name] is a 83 y.o. female with medical history significant of afib on Eliquis; HTN; HLD; systolic CHF; anxiety; and arthritis presenting with diarrhea starting yesterday.  She has been staying with her granddaughter for the last week - she fell a week ago Thursday and fell and fractured 2 ribs.  Significant respiratory congestion nightly for the last 3-4 nights.  No fevers.  Abdominal pain, thought she was impacted.  She took 2 Miralax yesterday.  Diarrhea started about 9pm and it has been nonstop since then.  She stands up and it just comes out.  It has been dark colored and liquidy throughout.  No bright red blood, only melena.  She took 2 Imodium without  relief.  Abdominal pain started maybe Wednesday or Thursday of last week.  She was given antibiotics in August for a UTI, has a h/o UTIs that "make her very ill very quickly".   ED Course: Diarrhea - recent abx, C diff negative.  Cough - check CXR, ?PNA.  +guaiac.  ?infectious diarrhea.  A/P CT pending.   Hospital Course:  Principal Problem:   Diarrhea Active Problems:   Atrial flutter ([facility])   Chronic combined systolic and diastolic CHF (congestive heart failure) (H[facility])   Rib fractures   CHF exacerbation ([facility])   Diarrhea with history of severe constipation Proctitis, history of microscopic colitis -CT ab with fecal impaction and sterocoral proctitis with stoll ball distending the rectum,  -Patient is thought to have diarrhea around her severe constipation, had good results with tap water enema given last night. Continue bowel regimen.  -C. difficile/GI pathogen panel negative.  -Repeat abdominal films negative for bowel dilatation. -oral iron supplement decreased from bid to every mwf. Stool softener prescribed.    Right-sided pleural effusion/ Chronic diastolic CHF -Known 3 diastolic dysfunction on the echocardiogram. -chest x-ray 12/01/16 showed a moderate right pleural effusion with underlying atelectasis versus infiltrate. - she is treated with iv lasix 40mg  bid , this is transitioned to lasix oral 20mg  daily -she  is close to her dry weight and 125 pounds. She was 132 pounds on admission.  -cardiology consulted, input appreciated.  History of atrial flutter and afibrillation/new left bundle branch block -CHA2DS2-VASc Score is 5. Continue Eliquis 2.5mg   bid.  - resume Amiodarone at 200mg  daily with plans for follow-up with EP in the outpatient setting to address further rate vs. rhythm control.  -Not on BB or CCB due to history of hypotension. - Cardiology does not recommend a further ischemic evaluation and troponins here have been negative. She denies chest  pain.  Transaminitis Improving but have not yet normalized at this time. Ultrasound shows hemangioma which is stable.  Rib fracture Pain control, lidocaine patch.  Generalized weakness Evaluated by PT. Will need 24-hour supervision. Declines SNF placement. home health PT/RN.   Splenic lesion Slightly and enlarged from previous scan. Follow-up with outpatient PCP.  Adrenal mass on the left side Appears to be stable from previous scan. Follow-up with outpatient PCP.  Large hiatal/paraesophageal hernia containing majority of the stomach in transverse colon, no obstruction follow up with pcp.  Obesity Body mass index is 33.97 kg/m.   Family Communication/Anticipated D/C date and plan/Code Status   DVT prophylaxis: Eliquis ordered. Code Status: DO NOT RESUSCITATE  Family Communication: family  at the bedside. Disposition Plan: Home with home health    Procedures:  none  Consultations:  cardiology  Discharge Exam: BP (!) 96/56 (BP Location: Right Arm)   Pulse 63   Temp 98.2 F (36.8 C) (Oral)   Resp 18   Ht 4\' 9"  (1.448 m)   Wt 54.3 kg (119 lb 9.6 oz)   SpO2 92%   BMI 25.88 kg/m   General: frail, NAD Cardiovascular: IRRR Respiratory: decreased on the right side Ab: soft, nontender, + bs Extremities: no edeam  Discharge Instructions You were cared for by a hospitalist during your hospital stay. If you have any questions about your discharge medications or the care you received while you were in the hospital after you are discharged, you can call the unit and asked to speak with the hospitalist on call if the hospitalist that took care of you is not available. Once you are discharged, your primary care physician will handle any further medical issues. Please note that NO REFILLS for any discharge medications will be authorized once you are discharged, as it is imperative that you return to your primary care physician (or establish a relationship with a  primary care physician if you do not have one) for your aftercare needs so that they can reassess your need for medications and monitor your lab values.  Discharge Instructions    Diet - low sodium heart healthy    Complete by:  As directed    Face-to-face encounter (required for Medicare/Medicaid patients)    Complete by:  As directed    I Yazhini Mcaulay certify that this patient is under my care and that I, or a nurse practitioner or physician's assistant working with me, had a face-to-face encounter that meets the physician face-to-face encounter requirements with this patient on 12/03/2016. The encounter with the patient was in whole, or in part for the following medical condition(s) which is the primary reason for home health care (List medical condition): FTT   The encounter with the patient was in whole, or in part, for the following medical condition, which is the primary reason for home health care:  FTT   I certify that, based on my findings, the following services are medically necessary home health services:   Nursing Physical therapy     Reason for Medically Necessary Home Health Services:  Skilled Nursing- Change/Decline in Patient Status   My clinical findings support the need for the above services:  Shortness of breath with activity   Further, I certify that my clinical findings support that this patient is homebound due to:  Shortness of Breath with activity   Home Health    Complete by:  As directed    To provide the following care/treatments:   PT RN     Increase activity slowly    Complete by:  As directed      Allergies as of 12/03/2016      Reactions   Amoxicillin-pot Clavulanate Diarrhea   Has patient had a PCN reaction causing immediate rash, facial/tongue/throat swelling, SOB or lightheadedness with hypotension: no Has patient had a PCN reaction causing severe rash involving mucus membranes or skin necrosis:  no Has patient had a PCN reaction that required  hospitalization: no Has patient had a PCN reaction occurring within the last 10 years no If all of the above answers are "NO", then may proceed with Cephalosporin use.   Sulfonamide Derivatives Other (See Comments)   unknown      Medication List    STOP taking these medications   oxyCODONE-acetaminophen 5-325 MG tablet Commonly known as:  PERCOCET/ROXICET   Vitamin D (Ergocalciferol) 50000 units Caps capsule Commonly known as:  DRISDOL     TAKE these medications   amiodarone 200 MG tablet Commonly known as:  PACERONE Take 1 tablet (200 mg total) by mouth daily.   atorvastatin 20 MG tablet Commonly known as:  LIPITOR TAKE 1 TABLET DAILY AT 6 P.M.   benzonatate 100 MG capsule Commonly known as:  TESSALON PERLES Take 1 capsule (100 mg total) by mouth 2 (two) times daily as needed for cough.   calcium-vitamin D 500-200 MG-UNIT tablet Commonly known as:  OSCAL WITH D Take 1 tablet by mouth daily.   cholecalciferol 1000 units tablet Commonly known as:  VITAMIN D Take 1,000 Units by mouth daily.   ELIQUIS 2.5 MG Tabs tablet Generic drug:  apixaban TAKE 1 TABLET TWICE A DAY   esomeprazole 40 MG capsule Commonly known as:  NEXIUM TAKE 1 CAPSULE DAILY   Ferrous Sulfate Dried 200 (65 Fe) MG Tabs Commonly known as:  FEOSOL Take 1 tablet by mouth every Monday, Wednesday, and Friday. Take with a vitamin c 500mg  What changed:  when to take this   fexofenadine 180 MG tablet Commonly known as:  ALLEGRA Take 180 mg by mouth daily as needed for allergies or rhinitis.   furosemide 20 MG tablet Commonly known as:  LASIX Take 1 tablet (20 mg total) by mouth daily.   isosorbide mononitrate 30 MG 24 hr tablet Commonly known as:  IMDUR Take 1 tablet (30 mg total) by mouth daily.   polyethylene glycol packet Commonly known as:  MIRALAX / GLYCOLAX Take 17 g by mouth 2 (two) times daily. What changed:  when to take this  reasons to take this   pregabalin 50 MG  capsule Commonly known as:  LYRICA Take 1 capsule (50 mg total) by mouth 3 (three) times daily.   senna-docusate 8.6-50 MG tablet Commonly known as:  Senokot-S Take 1 tablet by mouth at bedtime.   solifenacin 10 MG tablet Commonly known as:  VESICARE Take 1 tablet (10 mg total) by mouth daily.   vitamin B-12 1000 MCG tablet Commonly known as:  CYANOCOBALAMIN Take 1 tablet (1,000 mcg total) by mouth daily.      Allergies  Allergen Reactions  . Amoxicillin-Pot Clavulanate Diarrhea    Has patient had a PCN reaction causing immediate rash, facial/tongue/throat  swelling, SOB or lightheadedness with hypotension: no Has patient had a PCN reaction causing severe rash involving mucus membranes or skin necrosis:  no Has patient had a PCN reaction that required hospitalization: no Has patient had a PCN reaction occurring within the last 10 years no If all of the above answers are "NO", then may proceed with Cephalosporin use.   . Sulfonamide Derivatives Other (See Comments)    unknown   Follow-up Information    Constance Haw, MD Follow up.   Specialty:  Cardiology Why:  The office will contact you to arrange follow-up with Dr. Ileana Ladd within the next 2-3 weeks.  Contact information: [address] [place] [address] [phone]        Biagio Borg, MD Follow up in 1 week(s).   Specialties:  Internal Medicine, Radiology Why:  hospital discharge follow up, repeat cbc/bmp at follow up Contact information: [place] [address] [phone]            The results of significant diagnostics from this hospitalization (including imaging, microbiology, ancillary and laboratory) are listed below for reference.    Significant Diagnostic Studies: Dg Chest 2 View  Result Date: 11/27/2016 CLINICAL DATA:  Productive cough x1 week with right-sided rib fracture that occurred last week. EXAM: CHEST  2 VIEW COMPARISON:  11/18/2016 FINDINGS:  Low lung volumes with interval increase in pulmonary vascular congestion and right effusion. Large hiatal hernia projects over the heart. Aortic atherosclerosis without aneurysm. Osteoarthritis of the AC glenohumeral joints bilaterally. Thoracic spondylosis. No acute displaced rib fracture is apparent on current exam provided. IMPRESSION: 1. Low lung volumes are again noted with large hiatal hernia. 2. Interval increase in pulmonary vascular congestion and small right effusion. Findings likely represent mild CHF. 3. Aortic atherosclerosis. Electronically Signed   By: Ashley Royalty M.D.   On: 11/27/2016 20:00   Dg Chest 2 View  Result Date: 11/19/2016 CLINICAL DATA:  Shortness of breath, right rib pain after fall yesterday. EXAM: CHEST  2 VIEW COMPARISON:  Radiographs of November 19, 2015. FINDINGS: Stable large hiatal hernia is noted. Atherosclerosis of thoracic aorta is noted. Hypoinflation of the lungs is noted with mild bibasilar subsegmental atelectasis. No pneumothorax is noted. Bony thorax is unremarkable. IMPRESSION: Stable large hiatal hernia. Aortic atherosclerosis. Hypoinflation of the lungs is noted with mild bibasilar subsegmental atelectasis. Electronically Signed   By: Marijo Conception, M.D.   On: 11/19/2016 09:18   Dg Ribs Unilateral Right  Result Date: 11/19/2016 CLINICAL DATA:  [place] yesterday.  Anterior right-sided rib pain. EXAM: RIGHT RIBS - 2 VIEW COMPARISON:  11/19/2015 FINDINGS: Positioning for these films is quite atypical. I think there are 2 anterior rib fractures, but cannot determine the exact levels. IMPRESSION: Atypical positioning prevents accurate level determination. Two anterior right rib fractures. Electronically Signed   By: Nelson Chimes M.D.   On: 11/19/2016 09:18   Dg Chest Right Decubitus  Result Date: 11/29/2016 CLINICAL DATA:  Right pleural effusion EXAM: CHEST - RIGHT DECUBITUS COMPARISON:  PA and lateral chest radiographs November 27, 2016 FINDINGS: Right lateral  decubitus image shows the moderate free-flowing pleural effusion on the right. There is bibasilar atelectasis. Lungs elsewhere appear clear. Heart is mildly enlarged. Pulmonary vascularity appears within normal limits. No adenopathy evident. IMPRESSION: Moderate free-flowing right pleural effusion. Bibasilar atelectasis. Mild cardiac prominence, stable. Electronically Signed   By: Lowella Grip III M.D.   On: 11/29/2016 09:24   Ct Abdomen  Pelvis W Contrast  Result Date: 11/27/2016 CLINICAL DATA:  Abdominal pain and diarrhea. Fall 1 week ago with broken ribs. EXAM: CT ABDOMEN AND PELVIS WITH CONTRAST TECHNIQUE: Multidetector CT imaging of the abdomen and pelvis was performed using the standard protocol following bolus administration of intravenous contrast. CONTRAST:  80 cc Isovue-300 IV COMPARISON:  Most recent CT 01/31/2015 FINDINGS: Lower chest: Moderate right pleural effusion measuring simple fluid density. Mrs. partially loculated. Adjacent atelectasis in the right lower lobe. Hiatal hernia likely causes mass effect on the bronchus intermedius with right middle lobe atelectasis. Trace left pleural effusion and adjacent atelectasis. Fractures of anterior right sixth and seventh ribs. Hepatobiliary: No evidence of hepatic injury. Enhancing lesion adjacent with falciform ligament is not as well visualized on the current exam due to phase of contrast but grossly similar measuring 2.7 x 1.4 cm. Postcholecystectomy with stable extrahepatic and intrahepatic (left greater than right) biliary dilatation. Common bile duct measures 14 mm in mid distal portion. Pancreas: No ductal dilatation or inflammation. Spleen: Multiple low-density lesions throughout the spleen, with mild progression from prior exam. Largest lesion measures 3.2 x 2.9 cm, previously 2.8 x 2.7 cm. No perisplenic free fluid. Adrenals/Urinary Tract: Heterogeneous left adrenal mass measures 3.2 x 2.2 cm, unchanged from prior. No right adrenal  nodule. No hydronephrosis or perinephric edema. Small cyst in the posterior left kidney is unchanged from prior. Homogeneous renal enhancement with symmetric excretion on delayed phase imaging. Urinary bladder is distended without wall thickening. Stomach/Bowel: Lack of enteric contrast limits assessment. Large hiatal hernia contains majority of the stomach as well as transverse colon. No evidence of gastric inflammation or abnormal distention. Small bowel is decompressed. Moderate stool in the ascending, transverse, and proximal descending colon, portions of transverse colon extend into hiatal hernia without wall thickening or obstruction. More distal descending and sigmoid colon are decompressed. Significant rectal distention with stool with wall thickening and perirectal soft tissue edema and fluid. Rectal distention of 7.5 x 6.7 cm. No liquid stool or colonic inflammation. No definite pneumatosis or evidence perforation. Vascular/Lymphatic: Aortic and branch atherosclerosis. Aortic tortuosity. No aneurysm. Limited assessment for adenopathy given lack of enteric contrast and paucity of intra-abdominal fat. Reproductive: Status post hysterectomy. No adnexal masses. Other: Small amount of free fluid in the pelvis. No free air or intra-abdominal abscess. Mild mesenteric and body wall edema suggest third-spacing. Musculoskeletal: Scoliosis and multilevel degenerative change in the spine. No suspicious osseous abnormality. Acute fractures of right anterior sixth and seventh ribs. IMPRESSION: 1. Findings suspicious for fecal impaction and stercoral proctitis with stool ball distending the rectum, rectal wall thickening and perirectal edema/fluid. No colonic inflammation to suggest colitis. No bowel obstruction. 2. Large hiatal/paraesophageal hernia containing majority of the stomach in transverse colon, no obstruction. 3. Innumerable hypodense lesions scattered throughout the spleen, with only mild increase from exam  20 months prior, may be post infectious, inflammatory or neoplastic process. If neoplastic would be indolent given relative growth. 4. Left adrenal mass and enhancing liver lesion are unchanged. 5. Unchanged biliary dilatation postcholecystectomy. 6. Moderate right and small left pleural effusion, measuring simple fluid density, no hemothorax. Mesenteric and body wall edema. Overall findings suggest third-spacing/fluid overload. 7. Acute right anterior sixth and seventh rib fractures without associated pneumothorax or gross pulmonary contusion. Electronically Signed   By: Jeb Levering M.D.   On: 11/27/2016 22:21   Dg Abd Acute W/chest  Result Date: 12/01/2016 CLINICAL DATA:  Shortness of breath.  Abdominal pain. EXAM: DG ABDOMEN ACUTE W/ 1V  CHEST COMPARISON:  Radiograph of November 29, 2016. FINDINGS: Stable cardiomegaly. Atherosclerosis of thoracic aorta is noted. No pneumothorax is noted. Moderate right pleural effusion is noted with probable underlying atelectasis or infiltrate. Mild left basilar subsegmental atelectasis is noted. No pneumoperitoneum is noted. No abnormal bowel dilatation is noted. Phleboliths are noted in the pelvis. Status post cholecystectomy. IMPRESSION: Moderate right pleural effusion with probable underlying atelectasis or infiltrate. Aortic atherosclerosis. No abnormal bowel dilatation is noted. Electronically Signed   By: Marijo Conception, M.D.   On: 12/01/2016 15:59   Dg Abd Portable 1v  Result Date: 11/29/2016 CLINICAL DATA:  Elevated liver function studies EXAM: PORTABLE ABDOMEN - 1 VIEW COMPARISON:  CT scan of the abdomen and pelvis of November 27, 2016 FINDINGS: The colonic stool burden remains moderately increased. No small or large bowel obstructive pattern is observed. There is a small amount of rectal stool present. There surgical clips in the gallbladder fossa. There is moderate levocurvature of the lumbar spine centered at L4. IMPRESSION: Moderately increased colonic  stool burden persists. No evidence of obstruction. Electronically Signed   By: David  Martinique M.D.   On: 11/29/2016 09:01   US Abdomen Limited Ruq  Result Date: 11/29/2016 CLINICAL DATA:  Elevated liver function studies. History of previous cholecystectomy. EXAM: ULTRASOUND ABDOMEN LIMITED RIGHT UPPER QUADRANT COMPARISON:  Abdominal and pelvic CT scan of November 27, 2016 and abdominal and pelvic CT scan of January 31, 2015. FINDINGS: Gallbladder: The gallbladder surgically absent. Common bile duct: Diameter: 16.4 mm which is markedly dilated but which has been previously demonstrated. No intraluminal stones or sludge are observed. Liver: There is intrahepatic ductal dilation which is not a new finding. In the left lobe there is a hyperechoic focus measuring approximately 1 cm in diameter most compatible with a hemangioma. The surface contour of the liver is smooth. Portal vein is patent on color Doppler imaging with normal direction of blood flow towards the liver. There is a right pleural effusion. IMPRESSION: Chronic dilation of the intra- and extra-hepatic bile ducts. Previous cholecystectomy. Probable hemangioma in the left hepatic lobe measuring 1 cm in diameter which is been previously demonstrated. Electronically Signed   By: David  Martinique M.D.   On: 11/29/2016 15:08    Microbiology: Recent Results (from the past 240 hour(s))  Gastrointestinal Panel by PCR , Stool     Status: None   Collection Time: 11/27/16  6:54 PM  Result Value Ref Range Status   Campylobacter species NOT DETECTED NOT DETECTED Final   Plesimonas shigelloides NOT DETECTED NOT DETECTED Final   Salmonella species NOT DETECTED NOT DETECTED Final   Yersinia enterocolitica NOT DETECTED NOT DETECTED Final   Vibrio species NOT DETECTED NOT DETECTED Final   Vibrio cholerae NOT DETECTED NOT DETECTED Final   Enteroaggregative E coli (EAEC) NOT DETECTED NOT DETECTED Final   Enteropathogenic E coli (EPEC) NOT DETECTED NOT DETECTED  Final   Enterotoxigenic E coli (ETEC) NOT DETECTED NOT DETECTED Final   Shiga like toxin producing E coli (STEC) NOT DETECTED NOT DETECTED Final   Shigella/Enteroinvasive E coli (EIEC) NOT DETECTED NOT DETECTED Final   Cryptosporidium NOT DETECTED NOT DETECTED Final   Cyclospora cayetanensis NOT DETECTED NOT DETECTED Final   Entamoeba histolytica NOT DETECTED NOT DETECTED Final   Giardia lamblia NOT DETECTED NOT DETECTED Final   Adenovirus F40/41 NOT DETECTED NOT DETECTED Final   Astrovirus NOT DETECTED NOT DETECTED Final   Norovirus GI/GII NOT DETECTED NOT DETECTED Final   Rotavirus A NOT  DETECTED NOT DETECTED Final   Sapovirus (I, II, IV, and V) NOT DETECTED NOT DETECTED Final  C difficile quick scan w PCR reflex     Status: None   Collection Time: 11/27/16  6:54 PM  Result Value Ref Range Status   C Diff antigen NEGATIVE NEGATIVE Final   C Diff toxin NEGATIVE NEGATIVE Final   C Diff interpretation No C. difficile detected.  Final     Labs: Basic Metabolic Panel:  Recent Labs Lab 11/30/16 0614 12/01/16 0613 12/01/16 1426 12/02/16 0147 12/03/16 0518  NA 137 137 134* 137 136  K 3.6 3.5 3.6 3.6 3.4*  CL 101 99* 97* 98* 96*  CO2 29 31 31  32 32  GLUCOSE 85 91 113* 91 86  BUN 14 14 15 13 13   CREATININE 0.77 0.73 0.78 0.76 0.76  CALCIUM 8.4* 8.5* 8.2* 8.4* 8.8*  MG 1.8 1.8 1.7 2.1 1.9   Liver Function Tests:  Recent Labs Lab 11/27/16 1851 11/29/16 0631 11/30/16 0614 12/01/16 0613  AST 32 233* 100* 56*  ALT 35 282* 178* 128*  ALKPHOS 94 228* 191* 171*  BILITOT 0.9 0.7 0.6 0.6  PROT 5.9* 5.1* 5.2* 5.6*  ALBUMIN 3.1* 2.8* 2.8* 2.9*    Recent Labs Lab 11/27/16 1851  LIPASE 20   No results for input(s): AMMONIA in the last 168 hours. CBC:  Recent Labs Lab 11/27/16 1851 11/27/16 1906 11/28/16 0458 11/29/16 0631 11/30/16 0614 12/01/16 0613  WBC 18.6*  --  18.9* 13.8* 10.6* 10.2  NEUTROABS 16.0*  --   --  11.0* 8.3* 7.9*  HGB 12.2 10.5* 12.9 11.2* 11.4*  12.3  HCT 37.6 31.0* 39.0 34.2* 35.0* 37.3  MCV 93.5  --  92.2 91.2 92.6 89.7  PLT 190  --  200 200 196 235   Cardiac Enzymes:  Recent Labs Lab 12/01/16 1426 12/01/16 2018 12/02/16 0147  TROPONINI <0.03 <0.03 <0.03   BNP: BNP (last 3 results)  Recent Labs  11/27/16 2323  BNP 348.3*    ProBNP (last 3 results) No results for input(s): PROBNP in the last 8760 hours.  CBG: No results for input(s): GLUCAP in the last 168 hours.     SignedFlorencia Reasons MD, PhD  [facility] 12/03/2016, 3:31 PM

## 2016-12-16 ENCOUNTER — Other Ambulatory Visit: Payer: Self-pay | Admitting: Internal Medicine

## 2016-12-21 ENCOUNTER — Other Ambulatory Visit: Payer: Self-pay | Admitting: Internal Medicine

## 2016-12-28 NOTE — Progress Notes (Signed)
Electrophysiology Office Note   Date:  12/29/2016   ID:  Cathy [name], DOB 05-14-1927, MRN [id number]  PCP:  Biagio Borg, MD  Cardiologist:  Pernell Dupre Primary Electrophysiologist:  Constance Haw, MD    No chief complaint on file.    History of Present Illness: Cathy [name] is a 83 y.o. female who presents today for electrophysiology evaluation.   She has a history of HTN, venous insufficiency, hypercholesterolemia, anemia, and recently diagnosed atrial flutter, combined CHF/LV dysfunction (EF 25-30%), and coronary artery calcification.  She was admitted to the hospital 01/2015 after being diagnosed with atrial flutter and 2-1 conduction.  CTA showed no PE.  TEE cardioversion was planned but was not able to adequately visualize the left atrial appendage.  She was started on amiodarone and Eliquis.  Echo 02/02/15 showed an EF of 25-30%.  Patient was seen back and cardioversion was arranged.  She was on amiodarone at the time.  Unfortunately, she fell and broke right-sided ribs and this cardioversion was not performed.  She returns to clinic today to discuss further plans for rhythm control  Today, denies symptoms of palpitations, chest pain, lower extremity edema, claudication, dizziness, presyncope, syncope, bleeding, or neurologic sequela. The patient is tolerating medications without difficulties.  Her main complaint today is of shortness of breath and fatigue.  She has difficulty doing all of her daily activities without having to rest.  She is otherwise been feeling quite well without major complaint.  Past Medical History:  Diagnosis Date  . Allergic rhinitis   . Anemia   . Anxiety   . Chronic systolic CHF (congestive heart failure) ([place])    a. 2D Echo 02/02/15: EF 25-30%, akinesis of mid-apical anteroseptal myocardium, grade 3 DD, mod MR, severely dilated LA, mildly reduced RV systoluc function, mod dilated RA, mod TR, mildly increased PASP 21mmHg. b. 10/2016: EF 55-60%  with no regional WMA. Grade 3 DD.   [name] Colitis    lymphocytic colitis feb 2011  . Coronary artery calcification    a. By CT 01/2015.  . Diverticulosis of colon   . DJD (degenerative joint disease)    gets epidural injections  . Dysphagia   . Hiatal hernia   . Hypercholesteremia   . Hypertension   . Iron deficiency   . Irritable bowel syndrome   . Liver lesion 02/18/2015  . Lumbar back pain   . Osteoporosis   . Paroxysmal atrial flutter ([place])    a. Dx 01/2015 - underwent DCCV 03/2015; takes Eliquis  . Sinus bradycardia   . Splenic lesion 02/18/2015  . Urinary incontinence   . Venous insufficiency   . Vitamin B12 deficiency    Past Surgical History:  Procedure Laterality Date  . ABDOMINAL HYSTERECTOMY    . CARDIOVERSION N/A 02/03/2015   Procedure: CARDIOVERSION;  Surgeon: Sueanne Margarita, MD;  Location: [facility] ENDOSCOPY;  Service: Cardiovascular;  Laterality: N/A;  . CARDIOVERSION N/A 03/10/2015   Procedure: CARDIOVERSION;  Surgeon: Dorothy Spark, MD;  Location: [facility] ENDOSCOPY;  Service: Cardiovascular;  Laterality: N/A;  . cataract surgery    . decompressive lumbar laminectomy  06/2008   at L2-3, L3-4 and L4-5 by Dr. Ronnald Ramp  . LAPAROSCOPIC CHOLECYSTECTOMY  04/2009   Dr. Abran Cantor  . REPLACEMENT TOTAL KNEE    . TEE WITHOUT CARDIOVERSION N/A 02/03/2015   Procedure: TRANSESOPHAGEAL ECHOCARDIOGRAM (TEE);  Surgeon: Sueanne Margarita, MD;  Location: [facility] ENDOSCOPY;  Service: Cardiovascular;  Laterality: N/A;     Current Outpatient  Prescriptions  Medication Sig Dispense Refill  . amiodarone (PACERONE) 200 MG tablet Take 1 tablet (200 mg total) by mouth daily. 90 tablet 1  . atorvastatin (LIPITOR) 20 MG tablet TAKE 1 TABLET DAILY AT 6 P.M. 90 tablet 2  . benzonatate (TESSALON) 100 MG capsule TAKE 1 CAPSULE(100 MG) BY MOUTH TWICE DAILY AS NEEDED FOR COUGH 60 capsule 0  . calcium-vitamin D (OSCAL WITH D) 500-200 MG-UNIT per tablet Take 1 tablet by mouth daily.     . cholecalciferol (VITAMIN D)  1000 UNITS tablet Take 1,000 Units by mouth daily.    [name] ELIQUIS 2.5 MG TABS tablet TAKE 1 TABLET TWICE A DAY 180 tablet 1  . esomeprazole (NEXIUM) 40 MG capsule TAKE 1 CAPSULE DAILY 90 capsule 1  . Ferrous Sulfate Dried (FEOSOL) 200 (65 Fe) MG TABS Take 1 tablet by mouth every Monday, Wednesday, and Friday. Take with a vitamin c 500mg  30 tablet 0  . fexofenadine (ALLEGRA) 180 MG tablet Take 180 mg by mouth daily as needed for allergies or rhinitis.    . furosemide (LASIX) 20 MG tablet Take 1 tablet (20 mg total) by mouth daily. 30 tablet 0  . isosorbide mononitrate (IMDUR) 30 MG 24 hr tablet Take 1 tablet (30 mg total) by mouth daily. 90 tablet 3  . polyethylene glycol (MIRALAX / GLYCOLAX) packet Take 17 g by mouth 2 (two) times daily. (Patient taking differently: Take 17 g by mouth daily as needed for mild constipation or moderate constipation. ) 28 each 0  . pregabalin (LYRICA) 50 MG capsule Take 1 capsule (50 mg total) by mouth 3 (three) times daily. 270 capsule 1  . senna-docusate (SENOKOT-S) 8.6-50 MG tablet Take 1 tablet by mouth at bedtime. 30 tablet 0  . solifenacin (VESICARE) 10 MG tablet Take 1 tablet (10 mg total) by mouth daily. 90 tablet 1  . vitamin B-12 (CYANOCOBALAMIN) 1000 MCG tablet Take 1 tablet (1,000 mcg total) by mouth daily.     No current facility-administered medications for this visit.     Allergies:   Amoxicillin-pot clavulanate and Sulfonamide derivatives   Social History:  The patient  reports that she has quit smoking. Her smoking use included Cigarettes. She has a 5.00 pack-year smoking history. She has never used smokeless tobacco. She reports that she does not drink alcohol or use drugs.   Family History:  The patient's family history includes Diabetes in her brother; Heart attack in her brother and mother.    ROS:  Please see the history of present illness.   Otherwise, review of systems is positive for short of breath, cough, snoring, diarrhea, back pain,  easy bruising.   All other systems are reviewed and negative.   PHYSICAL EXAM: VS:  There were no vitals taken for this visit. , BMI There is no height or weight on file to calculate BMI. GEN: Well nourished, well developed, in no acute distress  HEENT: normal  Neck: no JVD, carotid bruits, or masses Cardiac: RRR; no murmurs, rubs, or gallops,no edema  Respiratory:  clear to auscultation bilaterally, normal work of breathing GI: soft, nontender, nondistended, + BS MS: no deformity or atrophy  Skin: warm and dry Neuro:  Strength and sensation are intact Psych: euthymic mood, full affect  EKG:  EKG is ordered today. Personal review of the ekg ordered shows atrial flutter   Recent Labs: 11/27/2016: B Natriuretic Peptide 348.3 12/01/2016: ALT 128; Hemoglobin 12.3; Platelets 235; TSH 3.204 12/03/2016: BUN 13; Creatinine, Ser 0.76; Magnesium 1.9;  Potassium 3.4; Sodium 136    Lipid Panel     Component Value Date/Time   CHOL 132 04/01/2016 1402   TRIG 57.0 04/01/2016 1402   HDL 64.80 04/01/2016 1402   CHOLHDL 2 04/01/2016 1402   VLDL 11.4 04/01/2016 1402   LDLCALC 56 04/01/2016 1402   LDLDIRECT 158.1 05/29/2008 1137     Wt Readings from Last 3 Encounters:  12/03/16 119 lb 9.6 oz (54.3 kg)  11/18/16 126 lb (57.2 kg)  10/26/16 126 lb 9.6 oz (57.4 kg)      Other studies Reviewed: Additional studies/ records that were reviewed today include: TTE 02/02/15 Review of the above records today demonstrates:  - Left ventricle: The cavity size was normal. There was moderate concentric hypertrophy. Systolic function was severely reduced. The estimated ejection fraction was in the range of 25% to 30%. There is akinesis of the mid-apicalanteroseptal myocardium. Doppler parameters are consistent with a reversible restrictive pattern, indicative of decreased left ventricular diastolic compliance and/or increased left atrial pressure (grade 3 diastolic dysfunction). - Mitral  valve: Moderately calcified annulus. There was moderate regurgitation. - Left atrium: The atrium was severely dilated. Volume/bsa, ES (1-plane Simpson&'s, A4C): 64.3 ml/m^2. - Right ventricle: Systolic function was mildly reduced. - Right atrium: The atrium was moderately dilated. - Tricuspid valve: There was moderate regurgitation. - Pulmonary arteries: Systolic pressure was mildly increased. PA peak pressure: 33 mm Hg (S).   ASSESSMENT AND PLAN:  1. Atrial flutter: Only on Eliquis.  Has been cardioverted in the past, though she has not maintained sinus rhythm.  She has since been loaded on amiodarone symptoms of fatigue and shortness of breath with her atrial flutter.  Would benefit from cardioversion.    This patients CHA2DS2-VASc Score and unadjusted Ischemic Stroke Rate (% per year) is equal to 4.8 % stroke rate/year from a score of 4  Above score calculated as 1 point each if present [CHF, HTN, DM, Vascular=MI/PAD/Aortic Plaque, Age if 65-74, or Female] Above score calculated as 2 points each if present [Age > 75, or Stroke/TIA/TE]  2. Chronic combined CHF -ejection fraction has since normalized.  No signs of volume overload.  She could have a tachycardia induced cardiomyopathy and thus cardioversion would be appropriate. 3. Essential HTN -well controlled today.  No changes.  Current medicines are reviewed at length with the patient today.   The patient does not have concerns regarding her medicines.  The following changes were made today:  none  Labs/ tests ordered today include:  Orders Placed This Encounter  Procedures  . Basic Metabolic Panel (BMET)  . CBC w/Diff     Disposition:   FU with Wei Newbrough 1.5  months  Signed, Jaysie Benthall Meredith Leeds, MD  12/29/2016 2:08 PM     [facility] [address] [place] [address] [phone] (office) [phone] (fax)

## 2016-12-29 ENCOUNTER — Other Ambulatory Visit: Payer: Self-pay | Admitting: Cardiology

## 2016-12-29 ENCOUNTER — Encounter: Payer: Self-pay | Admitting: Cardiology

## 2016-12-29 ENCOUNTER — Ambulatory Visit (INDEPENDENT_AMBULATORY_CARE_PROVIDER_SITE_OTHER): Payer: Medicare Other | Admitting: Cardiology

## 2016-12-29 ENCOUNTER — Encounter: Payer: Self-pay | Admitting: *Deleted

## 2016-12-29 VITALS — BP 122/78 | HR 63 | Ht <= 58 in | Wt 120.0 lb

## 2016-12-29 DIAGNOSIS — I428 Other cardiomyopathies: Secondary | ICD-10-CM | POA: Diagnosis not present

## 2016-12-29 DIAGNOSIS — I483 Typical atrial flutter: Secondary | ICD-10-CM

## 2016-12-29 DIAGNOSIS — I10 Essential (primary) hypertension: Secondary | ICD-10-CM

## 2016-12-29 DIAGNOSIS — Z01812 Encounter for preprocedural laboratory examination: Secondary | ICD-10-CM | POA: Diagnosis not present

## 2016-12-29 NOTE — Patient Instructions (Signed)
Medication Instructions:  Your physician recommends that you continue on your current medications as directed. Please refer to the Current Medication list given to you today.  Labwork: Pre procedure labs today: BMET & CBC w/ diff  Testing/Procedures: Your physician has recommended that you have a Cardioversion (DCCV). Electrical Cardioversion uses a jolt of electricity to your heart either through paddles or wired patches attached to your chest. This is a controlled, usually prescheduled, procedure. Defibrillation is done under light anesthesia in the hospital, and you usually go home the day of the procedure. This is done to get your heart back into a normal rhythm. You are not awake for the procedure. Please see the instruction sheet given to you today.  Follow-Up: Your physician recommends that you schedule a follow-up appointment in: 6 weeks with Dr. Curt Bears.   -- If you need a refill on your cardiac medications before your next appointment, please call your pharmacy. --  Thank you for choosing [facility]!!   Trinidad Curet, RN [phone]  Any Other Special Instructions Will Be Listed Below (If Applicable).   Electrical Cardioversion Electrical cardioversion is the delivery of a jolt of electricity to restore a normal rhythm to the heart. A rhythm that is too fast or is not regular keeps the heart from pumping well. In this procedure, sticky patches or metal paddles are placed on the chest to deliver electricity to the heart from a device. This procedure may be done in an emergency if:  There is low or no blood pressure as a result of the heart rhythm.  Normal rhythm must be restored as fast as possible to protect the brain and heart from further damage.  It may save a life.  This procedure may also be done for irregular or fast heart rhythms that are not immediately life-threatening. Tell a health care provider about:  Any allergies you have.  All medicines you are  taking, including vitamins, herbs, eye drops, creams, and over-the-counter medicines.  Any problems you or family members have had with anesthetic medicines.  Any blood disorders you have.  Any surgeries you have had.  Any medical conditions you have.  Whether you are pregnant or may be pregnant. What are the risks? Generally, this is a safe procedure. However, problems may occur, including:  Allergic reactions to medicines.  A blood clot that breaks free and travels to other parts of your body.  The possible return of an abnormal heart rhythm within hours or days after the procedure.  Your heart stopping (cardiac arrest). This is rare.  What happens before the procedure? Medicines  Your health care provider may have you start taking: ? Blood-thinning medicines (anticoagulants) so your blood does not clot as easily. ? Medicines may be given to help stabilize your heart rate and rhythm.  Ask your health care provider about changing or stopping your regular medicines. This is especially important if you are taking diabetes medicines or blood thinners. General instructions  Plan to have someone take you home from the hospital or clinic.  If you will be going home right after the procedure, plan to have someone with you for 24 hours.  Follow instructions from your health care provider about eating or drinking restrictions. What happens during the procedure?  To lower your risk of infection: ? Your health care team will wash or sanitize their hands. ? Your skin will be washed with soap.  An IV tube will be inserted into one of your veins.  You  will be given a medicine to help you relax (sedative).  Sticky patches (electrodes) or metal paddles may be placed on your chest.  An electrical shock will be delivered. The procedure may vary among health care providers and hospitals. What happens after the procedure?  Your blood pressure, heart rate, breathing rate, and blood  oxygen level will be monitored until the medicines you were given have worn off.  Do not drive for 24 hours if you were given a sedative.  Your heart rhythm will be watched to make sure it does not change. This information is not intended to replace advice given to you by your health care provider. Make sure you discuss any questions you have with your health care provider. Document Released: 02/05/2002 Document Revised: 10/15/2015 Document Reviewed: 08/22/2015 Elsevier Interactive Patient Education  2017 [employer].

## 2016-12-29 NOTE — Addendum Note (Signed)
Addended by: Stanton Kidney on: 12/29/2016 04:42 PM   Modules accepted: Orders

## 2016-12-30 LAB — CBC WITH DIFFERENTIAL/PLATELET
BASOS ABS: 0 10*3/uL (ref 0.0–0.2)
BASOS: 0 %
EOS (ABSOLUTE): 0.1 10*3/uL (ref 0.0–0.4)
Eos: 1 %
Hematocrit: 37 % (ref 34.0–46.6)
Hemoglobin: 12.2 g/dL (ref 11.1–15.9)
IMMATURE GRANULOCYTES: 0 %
Immature Grans (Abs): 0 10*3/uL (ref 0.0–0.1)
Lymphocytes Absolute: 1.1 10*3/uL (ref 0.7–3.1)
Lymphs: 11 %
MCH: 30.2 pg (ref 26.6–33.0)
MCHC: 33 g/dL (ref 31.5–35.7)
MCV: 92 fL (ref 79–97)
MONOS ABS: 1.1 10*3/uL — AB (ref 0.1–0.9)
Monocytes: 11 %
NEUTROS PCT: 77 %
Neutrophils Absolute: 7.9 10*3/uL — ABNORMAL HIGH (ref 1.4–7.0)
PLATELETS: 238 10*3/uL (ref 150–379)
RBC: 4.04 x10E6/uL (ref 3.77–5.28)
RDW: 14.2 % (ref 12.3–15.4)
WBC: 10.2 10*3/uL (ref 3.4–10.8)

## 2016-12-30 LAB — BASIC METABOLIC PANEL
BUN/Creatinine Ratio: 15 (ref 12–28)
BUN: 14 mg/dL (ref 8–27)
CALCIUM: 9.3 mg/dL (ref 8.7–10.3)
CO2: 31 mmol/L — AB (ref 20–29)
Chloride: 99 mmol/L (ref 96–106)
Creatinine, Ser: 0.96 mg/dL (ref 0.57–1.00)
GFR calc Af Amer: 61 mL/min/{1.73_m2} (ref >59)
GFR calc non Af Amer: 53 mL/min/{1.73_m2} — ABNORMAL LOW (ref >59)
GLUCOSE: 75 mg/dL (ref 65–99)
POTASSIUM: 4.5 mmol/L (ref 3.5–5.2)
SODIUM: 142 mmol/L (ref 134–144)

## 2017-01-05 ENCOUNTER — Emergency Department (HOSPITAL_BASED_OUTPATIENT_CLINIC_OR_DEPARTMENT_OTHER)
Admission: EM | Admit: 2017-01-05 | Discharge: 2017-01-05 | Disposition: A | Discharge status: Home / Self Care | Payer: Medicare Other | Attending: Emergency Medicine | Admitting: Emergency Medicine

## 2017-01-05 ENCOUNTER — Emergency Department (HOSPITAL_BASED_OUTPATIENT_CLINIC_OR_DEPARTMENT_OTHER): Payer: Medicare Other

## 2017-01-05 ENCOUNTER — Telehealth: Payer: Self-pay | Admitting: Cardiology

## 2017-01-05 ENCOUNTER — Encounter (HOSPITAL_BASED_OUTPATIENT_CLINIC_OR_DEPARTMENT_OTHER): Payer: Self-pay

## 2017-01-05 DIAGNOSIS — S0990XA Unspecified injury of head, initial encounter: Secondary | ICD-10-CM | POA: Diagnosis not present

## 2017-01-05 DIAGNOSIS — I110 Hypertensive heart disease with heart failure: Secondary | ICD-10-CM | POA: Diagnosis not present

## 2017-01-05 DIAGNOSIS — Z79899 Other long term (current) drug therapy: Secondary | ICD-10-CM | POA: Insufficient documentation

## 2017-01-05 DIAGNOSIS — Z96659 Presence of unspecified artificial knee joint: Secondary | ICD-10-CM | POA: Insufficient documentation

## 2017-01-05 DIAGNOSIS — Y929 Unspecified place or not applicable: Secondary | ICD-10-CM | POA: Insufficient documentation

## 2017-01-05 DIAGNOSIS — I2510 Atherosclerotic heart disease of native coronary artery without angina pectoris: Secondary | ICD-10-CM | POA: Diagnosis not present

## 2017-01-05 DIAGNOSIS — W010XXA Fall on same level from slipping, tripping and stumbling without subsequent striking against object, initial encounter: Secondary | ICD-10-CM | POA: Insufficient documentation

## 2017-01-05 DIAGNOSIS — Y999 Unspecified external cause status: Secondary | ICD-10-CM | POA: Diagnosis not present

## 2017-01-05 DIAGNOSIS — Z87891 Personal history of nicotine dependence: Secondary | ICD-10-CM | POA: Diagnosis not present

## 2017-01-05 DIAGNOSIS — S42445A Nondisplaced fracture (avulsion) of medial epicondyle of left humerus, initial encounter for closed fracture: Secondary | ICD-10-CM | POA: Diagnosis not present

## 2017-01-05 DIAGNOSIS — Z7901 Long term (current) use of anticoagulants: Secondary | ICD-10-CM | POA: Insufficient documentation

## 2017-01-05 DIAGNOSIS — I5042 Chronic combined systolic (congestive) and diastolic (congestive) heart failure: Secondary | ICD-10-CM | POA: Insufficient documentation

## 2017-01-05 DIAGNOSIS — Y9389 Activity, other specified: Secondary | ICD-10-CM | POA: Diagnosis not present

## 2017-01-05 DIAGNOSIS — W19XXXA Unspecified fall, initial encounter: Secondary | ICD-10-CM

## 2017-01-05 DIAGNOSIS — S59902A Unspecified injury of left elbow, initial encounter: Secondary | ICD-10-CM | POA: Diagnosis present

## 2017-01-05 MED ORDER — OXYCODONE-ACETAMINOPHEN 5-325 MG PO TABS
1.0000 | ORAL_TABLET | ORAL | Status: DC | PRN
  Administered 2017-01-05: 1 via ORAL
  Filled 2017-01-05: qty 1

## 2017-01-05 NOTE — ED Triage Notes (Signed)
Pt states she tripped and fell on carpeted floor today-pain to left elbow-presents to triage in w/c-NAD

## 2017-01-05 NOTE — Telephone Encounter (Signed)
Cathy [name] ( daughter ) is calling to find out if the procedure can still be done . Cathy [name] had a fall this morning and broke her elbow and will be in a sling . Please Call

## 2017-01-05 NOTE — Telephone Encounter (Signed)
Lm informing that I would reach out to the physcian performing DCCV Friday to confirm there is no contraindication to proceed. She understands I will call her once he lets me know.  Will forward to Dr. Debara Pickett to advise if ok to proceed w/ DCCV Friday

## 2017-01-05 NOTE — Discharge Instructions (Signed)
It was my pleasure taking care of you today!   Tylenol as needed for pain.  Please call the orthopedist listed to schedule a follow up appointment on Friday.   Return to ER if fingertips become blue or numb, new or worsening symptoms develop, any additional concerns.

## 2017-01-05 NOTE — ED Provider Notes (Signed)
[facility] EMERGENCY DEPARTMENT Provider Note   CSN: [id number] Arrival date & time: 01/05/17  1243     History   Chief Complaint Chief Complaint  Patient presents with  . Fall    HPI ISYS TIETJE is a 83 y.o. female.  The history is provided by the patient and a relative. No language interpreter was used.  Fall    TYNESHA FREE is a 83 y.o. female  with a PMH as listed below who presents to the Emergency Department for evaluation of left elbow pain after mechanical fall just prior to arrival.  Patient states that she did not lift her foot up high enough and tripped.  She states that she fell on her left elbow, then struck the side of her head.  Her glasses hit her temporal area, causing a skin abrasion.  Per chart review tetanus is up-to-date.  She denies loss of  consciousness or headache.  She is on Eliquis.  No weakness or dizziness prior. She denies any back pain, hip pain, neck pain, lower extremity pain or right upper extremity pain.  No numbness or tingling.  No medications given prior to arrival for symptoms.  Pain is worse with touching or moving the elbow.   Past Medical History:  Diagnosis Date  . Allergic rhinitis   . Anemia   . Anxiety   . Chronic systolic CHF (congestive heart failure) ([place])    a. 2D Echo 02/02/15: EF 25-30%, akinesis of mid-apical anteroseptal myocardium, grade 3 DD, mod MR, severely dilated LA, mildly reduced RV systoluc function, mod dilated RA, mod TR, mildly increased PASP 43mmHg. b. 10/2016: EF 55-60% with no regional WMA. Grade 3 DD.   [name] Colitis    lymphocytic colitis feb 2011  . Coronary artery calcification    a. By CT 01/2015.  . Diverticulosis of colon   . DJD (degenerative joint disease)    gets epidural injections  . Dysphagia   . Hiatal hernia   . Hypercholesteremia   . Hypertension   . Iron deficiency   . Irritable bowel syndrome   . Liver lesion 02/18/2015  . Lumbar back pain   . Osteoporosis   . Paroxysmal  atrial flutter ([place])    a. Dx 01/2015 - underwent DCCV 03/2015; takes Eliquis  . Sinus bradycardia   . Splenic lesion 02/18/2015  . Urinary incontinence   . Venous insufficiency   . Vitamin B12 deficiency     Patient Active Problem List   Diagnosis Date Noted  . Rib fractures 11/28/2016  . CHF exacerbation ([place]) 11/28/2016  . Diarrhea 11/27/2016  . Chest pain 11/18/2016  . Skin lesion 09/30/2016  . Ganglion cyst of dorsum of left wrist 09/30/2016  . Itching 09/30/2016  . Acute upper respiratory infection 09/30/2016  . Rotator cuff arthropathy, right 04/26/2016  . Hyperglycemia 04/01/2016  . Right arm pain 04/01/2016  . Wrist swelling 09/30/2015  . Acute URI 06/04/2015  . Encounter for well adult exam with abnormal findings 04/02/2015  . UTI (urinary tract infection) 04/02/2015  . Chronic combined systolic and diastolic CHF (congestive heart failure) ([place]) 03/17/2015  . Coronary artery calcification seen on CT scan 03/17/2015  . Sinus bradycardia 03/17/2015  . Splenic lesion 02/18/2015  . Liver lesion 02/18/2015  . CAD (coronary artery disease), native coronary artery 02/03/2015  . Atrial flutter ([place]) 02/02/2015  . Fatigue 01/31/2015  . Abnormal CT of the abdomen 01/31/2015  . Prolonged Q-T interval on ECG 01/31/2015  . Hepatic  lesion   . Constipation 12/03/2014  . Lower extremity edema 04/25/2014  . Peripheral neuropathy ([place]) 12/10/2013  . Overactive bladder 12/27/2011  . Osteoporosis 12/27/2011  . VITAMIN B12 DEFICIENCY 05/03/2010  . IRON DEFICIENCY 05/03/2010  . Anemia 05/03/2010  . CLOSTRIDIUM DIFFICILE COLITIS 05/26/2009  . VENOUS INSUFFICIENCY 01/27/2009  . BACK PAIN, LUMBAR 01/27/2008  . URINARY INCONTINENCE 08/23/2007  . DIVERTICULOSIS OF COLON 04/10/2007  . HYPERCHOLESTEROLEMIA 02/13/2007  . ANXIETY 02/13/2007  . Essential hypertension 02/13/2007  . HIATAL HERNIA 02/13/2007  . Irritable bowel syndrome 02/13/2007  . DEGENERATIVE JOINT DISEASE 02/13/2007    . Allergic rhinitis 02/13/2007    Past Surgical History:  Procedure Laterality Date  . ABDOMINAL HYSTERECTOMY    . cataract surgery    . decompressive lumbar laminectomy  06/2008   at L2-3, L3-4 and L4-5 by Dr. Ronnald Ramp  . LAPAROSCOPIC CHOLECYSTECTOMY  04/2009   Dr. Abran Cantor  . REPLACEMENT TOTAL KNEE      OB History    No data available       Home Medications    Prior to Admission medications   Medication Sig Start Date End Date Taking? Authorizing Provider  amiodarone (PACERONE) 200 MG tablet Take 1 tablet (200 mg total) by mouth daily. 11/09/16   Camnitz, Will Hassell Done, MD  atorvastatin (LIPITOR) 20 MG tablet TAKE 1 TABLET DAILY AT 6 P.M. 10/01/16   Biagio Borg, MD  benzonatate (TESSALON) 100 MG capsule TAKE 1 CAPSULE(100 MG) BY MOUTH TWICE DAILY AS NEEDED FOR COUGH 12/17/16   Biagio Borg, MD  calcium-vitamin D (OSCAL WITH D) 500-200 MG-UNIT per tablet Take 1 tablet by mouth daily.     [provider]  cholecalciferol (VITAMIN D) 1000 UNITS tablet Take 1,000 Units by mouth daily.    [provider]  ELIQUIS 2.5 MG TABS tablet TAKE 1 TABLET TWICE A DAY 08/30/16   Camnitz, Will Hassell Done, MD  esomeprazole (NEXIUM) 40 MG capsule TAKE 1 CAPSULE DAILY 12/21/16   Biagio Borg, MD  Ferrous Sulfate Dried (FEOSOL) 200 (65 Fe) MG TABS Take 1 tablet by mouth every Monday, Wednesday, and Friday. Take with a vitamin c 500mg  12/03/16   Florencia Reasons, MD  fexofenadine (ALLEGRA) 180 MG tablet Take 180 mg by mouth daily as needed for allergies or rhinitis.    [provider]  furosemide (LASIX) 20 MG tablet Take 1 tablet (20 mg total) by mouth daily. 12/04/16   Florencia Reasons, MD  isosorbide mononitrate (IMDUR) 30 MG 24 hr tablet Take 1 tablet (30 mg total) by mouth daily. 09/22/16   Camnitz, Ocie Doyne, MD  polyethylene glycol [facility] Floria Raveling) packet Take 17 g by mouth 2 (two) times daily. Patient taking differently: Take 17 g by mouth daily as needed for mild constipation or  moderate constipation.  10/22/14   Palumbo, April, MD  pregabalin (LYRICA) 50 MG capsule Take 1 capsule (50 mg total) by mouth 3 (three) times daily. 07/13/16   Biagio Borg, MD  senna-docusate (SENOKOT-S) 8.6-50 MG tablet Take 1 tablet by mouth at bedtime. 12/03/16   Florencia Reasons, MD  solifenacin (VESICARE) 10 MG tablet Take 1 tablet (10 mg total) by mouth daily. 06/17/14   Rowe Clack, MD  vitamin B-12 (CYANOCOBALAMIN) 1000 MCG tablet Take 1 tablet (1,000 mcg total) by mouth daily. 06/17/14   Rowe Clack, MD    Family History Family History  Problem Relation Age of Onset  . Heart attack Mother   . Heart attack  Brother   . Diabetes Brother   . Hypertension Neg Hx   . Fainting Neg Hx   . Stroke Neg Hx     Social History Social History   Tobacco Use  . Smoking status: Former Smoker    Packs/day: 0.50    Years: 10.00    Pack years: 5.00    Types: Cigarettes  . Smokeless tobacco: Never Used  Substance Use Topics  . Alcohol use: No  . Drug use: No     Allergies   Amoxicillin-pot clavulanate and Sulfonamide derivatives   Review of Systems Review of Systems  Musculoskeletal: Positive for arthralgias and myalgias.  Skin: Positive for wound.  All other systems reviewed and are negative.    Physical Exam Updated Vital Signs BP 128/73 (BP Location: Right Arm)   Pulse 60   Temp 97.6 F (36.4 C) (Oral)   Resp 20   Ht 4\' 11"  (1.499 m)   Wt 54.4 kg (120 lb)   SpO2 93% Comment: swtiched to both hands and 93 was the highest reading.  BMI 24.24 kg/m   Physical Exam  Constitutional: She is oriented to person, place, and time. She appears well-developed and well-nourished. No distress.  HENT:  Head: Normocephalic.  Small skin abrasion to left temporal region.  Cardiovascular: Normal rate, regular rhythm and normal heart sounds.  No murmur heard. Pulmonary/Chest: Effort normal and breath sounds normal. No respiratory distress.  Abdominal: Soft. She exhibits no  distension. There is no tenderness.  Musculoskeletal:  Tenderness to palpation of left lateral elbow. No open wounds. All compartments soft. Good grip strength. Full ROM although pain with flexion/extension. Sensation intact.  Neurological: She is alert and oriented to person, place, and time.  Speech clear and goal oriented. CN 2-12 grossly intact. Strength and sensation intact.  Skin: Skin is warm and dry.  Nursing note and vitals reviewed.    ED Treatments / Results  Labs (all labs ordered are listed, but only abnormal results are displayed) Labs Reviewed - No data to display  EKG  EKG Interpretation None       Radiology Dg Elbow Complete Left  Result Date: 01/05/2017 CLINICAL DATA:  Left elbow pain after fall EXAM: LEFT ELBOW - COMPLETE 3+ VIEW COMPARISON:  None. FINDINGS: Nondisplaced intra-articular fracture of the medial epicondyle of the left distal humerus with surrounding soft tissue swelling and left elbow joint effusion. No additional fracture. No dislocation. Mild spurring at the epicondyles bilaterally in the distal humerus. IMPRESSION: Nondisplaced intra-articular medial epicondylar fracture in the left distal humerus. Electronically Signed   By: Ilona Sorrel M.D.   On: 01/05/2017 13:28   Ct Head Wo Contrast  Result Date: 01/05/2017 CLINICAL DATA:  83 year old female status post fall, struck left forehead. Personal history of lymphoma. EXAM: CT HEAD WITHOUT CONTRAST TECHNIQUE: Contiguous axial images were obtained from the base of the skull through the vertex without intravenous contrast. COMPARISON:  PET-CT 02/21/2015 FINDINGS: Brain: Cerebral volume is within normal limits for age. No ventriculomegaly. No midline shift, mass effect, or evidence of intracranial mass lesion. No acute intracranial hemorrhage identified. Incidental choroid plexus cysts (normal variant). Patchy bilateral white matter and deep gray matter hypodensity, such as in the ventral left thalamus on  series 2, image 14, suggesting the sequelae of small vessel ischemia. No cortically based acute infarct identified. No cortically based encephalomalacia identified. Vascular: Calcified atherosclerosis at the skull base. No suspicious intracranial vascular hyperdensity. Skull: No skull fracture identified. Hyperostosis of the calvarium. No acute  osseous abnormality identified. Sinuses/Orbits: Clear. Other: No scalp hematoma identified. No acute scalp or orbits soft tissue findings identified. IMPRESSION: 1. No acute intracranial abnormality. No acute traumatic injury identified. 2. Evidence of moderate for age chronic small vessel disease. Electronically Signed   By: Genevie Ann M.D.   On: 01/05/2017 14:59    Procedures Procedures (including critical care time)  SPLINT APPLICATION Date/Time: 6:96 PM Authorized by: Ozella Almond Alima Naser Consent: Verbal consent obtained. Risks and benefits: risks, benefits and alternatives were discussed Consent given by: patient Splint applied by: ER technician Location details: Left upper extremity Splint type: Long arm Post-procedure: The splinted body part was neurovascularly unchanged following the procedure. Patient tolerance: Patient tolerated the procedure well with no immediate complications.     Medications Ordered in ED Medications  oxyCODONE-acetaminophen (PERCOCET/ROXICET) 5-325 MG per tablet 1 tablet (1 tablet Oral Given 01/05/17 1403)     Initial Impression / Assessment and Plan / ED Course  I have reviewed the triage vital signs and the nursing notes.  Pertinent labs & imaging results that were available during my care of the patient were reviewed by me and considered in my medical decision making (see chart for details).    RICHEL MILLSPAUGH is a 83 y.o. female who presents to ED for evaluation after mechanical fall. No focal neuro deficits. Abrasion to left temporal region. On eliquis. CT head negative. Tdap up-to-date. Complaining of left elbow  pain as she fell on her elbow. X-ray shows nondisplaced intra-articular medial epicondylar fracture of the left distal humerus. No open wounds to the elbow. LUE NVI. All compartments soft. Discussed case with orthopedics, Dr. Percell Miller, who recommends applying long arm splint and follow up on Friday. Evaluation does not show pathology that would require ongoing emergent intervention or inpatient treatment. Patient and family aware of symptomatic home care instructions, follow up plan and return precautions. All questions answered.   Patient discussed with Dr. Lita Mains who agrees with treatment plan.    Final Clinical Impressions(s) / ED Diagnoses   Final diagnoses:  Fall, initial encounter  Injury of head, initial encounter  Closed nondisplaced fracture of medial epicondyle of left humerus, unspecified fracture morphology, initial encounter    ED Discharge Orders    None       Nevaeha Finerty, Ozella Almond, PA-C 01/05/17 1658    Julianne Rice, MD 01/10/17 1746

## 2017-01-06 NOTE — Telephone Encounter (Signed)
D/w Dr. Debara Pickett who verbalized ok to proceed w/ DCCV tomorrow. Informed dtr ok to proceed. Dtr verbalized understanding and agreeable to plan.

## 2017-01-07 ENCOUNTER — Ambulatory Visit (HOSPITAL_COMMUNITY)
Admission: RE | Admit: 2017-01-07 | Discharge: 2017-01-07 | Disposition: A | Discharge status: Home / Self Care | Payer: Medicare Other | Source: Ambulatory Visit | Attending: Internal Medicine | Admitting: Internal Medicine

## 2017-01-07 ENCOUNTER — Ambulatory Visit (HOSPITAL_COMMUNITY): Payer: Medicare Other | Admitting: Anesthesiology

## 2017-01-07 ENCOUNTER — Encounter (HOSPITAL_COMMUNITY)
Admission: RE | Disposition: A | Discharge status: Home / Self Care | Payer: Self-pay | Source: Ambulatory Visit | Attending: Internal Medicine

## 2017-01-07 ENCOUNTER — Other Ambulatory Visit: Payer: Self-pay

## 2017-01-07 ENCOUNTER — Encounter (HOSPITAL_COMMUNITY): Payer: Self-pay | Admitting: *Deleted

## 2017-01-07 DIAGNOSIS — I110 Hypertensive heart disease with heart failure: Secondary | ICD-10-CM | POA: Insufficient documentation

## 2017-01-07 DIAGNOSIS — Z87891 Personal history of nicotine dependence: Secondary | ICD-10-CM | POA: Insufficient documentation

## 2017-01-07 DIAGNOSIS — I509 Heart failure, unspecified: Secondary | ICD-10-CM | POA: Diagnosis not present

## 2017-01-07 DIAGNOSIS — I483 Typical atrial flutter: Secondary | ICD-10-CM

## 2017-01-07 DIAGNOSIS — I2510 Atherosclerotic heart disease of native coronary artery without angina pectoris: Secondary | ICD-10-CM | POA: Diagnosis not present

## 2017-01-07 DIAGNOSIS — I739 Peripheral vascular disease, unspecified: Secondary | ICD-10-CM | POA: Insufficient documentation

## 2017-01-07 DIAGNOSIS — M1990 Unspecified osteoarthritis, unspecified site: Secondary | ICD-10-CM | POA: Insufficient documentation

## 2017-01-07 DIAGNOSIS — K219 Gastro-esophageal reflux disease without esophagitis: Secondary | ICD-10-CM | POA: Insufficient documentation

## 2017-01-07 DIAGNOSIS — I4891 Unspecified atrial fibrillation: Secondary | ICD-10-CM | POA: Insufficient documentation

## 2017-01-07 DIAGNOSIS — I4892 Unspecified atrial flutter: Secondary | ICD-10-CM | POA: Diagnosis not present

## 2017-01-07 DIAGNOSIS — F419 Anxiety disorder, unspecified: Secondary | ICD-10-CM | POA: Insufficient documentation

## 2017-01-07 HISTORY — PX: CARDIOVERSION: SHX1299

## 2017-01-07 LAB — POCT I-STAT 4, (NA,K, GLUC, HGB,HCT)
Glucose, Bld: 101 mg/dL — ABNORMAL HIGH (ref 65–99)
HCT: 34 % — ABNORMAL LOW (ref 36.0–46.0)
HEMOGLOBIN: 11.6 g/dL — AB (ref 12.0–15.0)
Potassium: 4.2 mmol/L (ref 3.5–5.1)
SODIUM: 139 mmol/L (ref 135–145)

## 2017-01-07 SURGERY — CARDIOVERSION
Anesthesia: General

## 2017-01-07 MED ORDER — SODIUM CHLORIDE 0.9 % IV SOLN
INTRAVENOUS | Status: DC
  Administered 2017-01-07: 09:00:00 via INTRAVENOUS

## 2017-01-07 MED ORDER — LIDOCAINE HCL (CARDIAC) 20 MG/ML IV SOLN
INTRAVENOUS | Status: DC | PRN
  Administered 2017-01-07: 30 mg via INTRAVENOUS

## 2017-01-07 MED ORDER — PROPOFOL 10 MG/ML IV BOLUS
INTRAVENOUS | Status: DC | PRN
  Administered 2017-01-07: 50 mg via INTRAVENOUS

## 2017-01-07 NOTE — Anesthesia Postprocedure Evaluation (Signed)
Anesthesia Post Note  Patient: Cathy [name]  Procedure(s) Performed: CARDIOVERSION (N/A )     Patient location during evaluation: PACU Anesthesia Type: General Level of consciousness: awake and alert Pain management: pain level controlled Vital Signs Assessment: post-procedure vital signs reviewed and stable Respiratory status: spontaneous breathing, nonlabored ventilation, respiratory function stable and patient connected to nasal cannula oxygen Cardiovascular status: blood pressure returned to baseline and stable Postop Assessment: no apparent nausea or vomiting Anesthetic complications: no    Last Vitals:  Vitals:   01/07/17 0955 01/07/17 1000  BP:  137/64  Pulse:  62  Resp:  (!) 21  Temp:    SpO2: 99% 96%    Last Pain:  Vitals:   01/07/17 0940  TempSrc: Oral                 Effie Berkshire

## 2017-01-07 NOTE — H&P (Signed)
   INTERVAL PROCEDURE H&P  History and Physical Interval Note:  01/07/2017 9:13 AM  Cathy [name] has presented today for their planned procedure. The various methods of treatment have been discussed with the patient and family. After consideration of risks, benefits and other options for treatment, the patient has consented to the procedure.  The patients' outpatient history has been reviewed, patient examined, and no change in status from most recent office note within the past 30 days. I have reviewed the patients' chart and labs and will proceed as planned. Questions were answered to the patient's satisfaction.   Pixie Casino, MD, [place]  Attending Cardiologist  Direct Dial: [phone]  Fax: [phone]  Website:  [name] 01/07/2017, 9:13 AM

## 2017-01-07 NOTE — Anesthesia Procedure Notes (Signed)
Procedure Name: MAC Date/Time: 01/07/2017 9:30 AM Performed by: Teressa Lower., CRNA Pre-anesthesia Checklist: Patient identified, Emergency Drugs available, Suction available, Patient being monitored and Timeout performed Patient Re-evaluated:Patient Re-evaluated prior to induction Oxygen Delivery Method: Ambu bag Preoxygenation: Pre-oxygenation with 100% oxygen

## 2017-01-07 NOTE — Transfer of Care (Addendum)
Immediate Anesthesia Transfer of Care Note  Patient: Cathy [name]  Procedure(s) Performed: CARDIOVERSION (N/A )  Patient Location: Endoscopy Unit  Anesthesia Type:General  Level of Consciousness: sedated and drowsy  Airway & Oxygen Therapy: Patient Spontanous Breathing and Patient connected to nasal cannula oxygen  Post-op Assessment: Report given to RN and Post -op Vital signs reviewed and stable  Post vital signs: Reviewed and stable  Last Vitals:  Vitals:   01/07/17 0910  BP: (!) 147/72  Pulse: (!) 59  Resp: (!) 21  Temp: 36.8 C  SpO2: 95%    Last Pain:  Vitals:   01/07/17 0910  TempSrc: Oral         Complications: No apparent anesthesia complications

## 2017-01-07 NOTE — Discharge Instructions (Signed)
Electrical Cardioversion, Care After °This sheet gives you information about how to care for yourself after your procedure. Your health care provider may also give you more specific instructions. If you have problems or questions, contact your health care provider. °What can I expect after the procedure? °After the procedure, it is common to have: °· Some redness on the skin where the shocks were given. ° °Follow these instructions at home: °· Do not drive for 24 hours if you were given a medicine to help you relax (sedative). °· Take over-the-counter and prescription medicines only as told by your health care provider. °· Ask your health care provider how to check your pulse. Check it often. °· Rest for 48 hours after the procedure or as told by your health care provider. °· Avoid or limit your caffeine use as told by your health care provider. °Contact a health care provider if: °· You feel like your heart is beating too quickly or your pulse is not regular. °· You have a serious muscle cramp that does not go away. °Get help right away if: °· You have discomfort in your chest. °· You are dizzy or you feel faint. °· You have trouble breathing or you are short of breath. °· Your speech is slurred. °· You have trouble moving an arm or leg on one side of your body. °· Your fingers or toes turn cold or blue. °This information is not intended to replace advice given to you by your health care provider. Make sure you discuss any questions you have with your health care provider. °Document Released: 12/06/2012 Document Revised: 09/19/2015 Document Reviewed: 08/22/2015 °Elsevier Interactive Patient Education © 2018 [employer] ° °

## 2017-01-07 NOTE — CV Procedure (Signed)
   CARDIOVERSION NOTE  Procedure: Electrical Cardioversion Indications:  Atrial Flutter  Procedure Details:  Consent: Risks of procedure as well as the alternatives and risks of each were explained to the (patient/caregiver).  Consent for procedure obtained.  Time Out: Verified patient identification, verified procedure, site/side was marked, verified correct patient position, special equipment/implants available, medications/allergies/relevent history reviewed, required imaging and test results available.  Performed  Patient placed on cardiac monitor, pulse oximetry, supplemental oxygen as necessary.  Sedation given: Propofol per anesthesia Pacer pads placed anterior and posterior chest.  Cardioverted 1 time(s).  Cardioverted at 120J biphasic.  Impression: Findings: Post procedure EKG shows: NSR Complications: None Patient did tolerate procedure well.  Plan: 1. Successful DCCV with a single 120J biphasic shock. 2. Follow-up with Dr. Curt Bears  Time Spent Directly with the Patient:  30 minutes   Cathy Casino, MD, [place]  Attending Cardiologist  Direct Dial: [phone]  Fax: [phone]  Website:  [name] 01/07/2017, 9:37 AM

## 2017-01-07 NOTE — Anesthesia Preprocedure Evaluation (Addendum)
Anesthesia Evaluation  Patient identified by MRN, date of birth, ID band Patient awake    Reviewed: Allergy & Precautions, NPO status , Patient's Chart, lab work & pertinent test results  Airway Mallampati: I  TM Distance: >3 FB Neck ROM: Full    Dental  (+) Teeth Intact, Dental Advisory Given   Pulmonary former smoker,    breath sounds clear to auscultation       Cardiovascular hypertension, Pt. on medications + CAD, + Peripheral Vascular Disease and +CHF  + dysrhythmias Atrial Fibrillation  Rhythm:Regular Rate:Normal     Neuro/Psych Anxiety  Neuromuscular disease    GI/Hepatic Neg liver ROS, hiatal hernia, GERD  Medicated,  Endo/Other  negative endocrine ROS  Renal/GU negative Renal ROS     Musculoskeletal  (+) Arthritis ,   Abdominal   Peds  Hematology   Anesthesia Other Findings   Reproductive/Obstetrics                            Lab Results  Component Value Date   WBC 10.2 12/29/2016   HGB 12.2 12/29/2016   HCT 37.0 12/29/2016   MCV 92 12/29/2016   PLT 238 12/29/2016   Lab Results  Component Value Date   INR 1.54 11/29/2016   INR 1.33 03/09/2010   INR 1.31 03/08/2010   EKG: atrial fibrillation.  Echo: - Left ventricle: The cavity size was normal. Wall thickness was   increased in a pattern of moderate LVH. Systolic function was   normal. The estimated ejection fraction was in the range of 55%   to 60%. Wall motion was normal; there were no regional wall   motion abnormalities. Doppler parameters are consistent with a   reversible restrictive pattern, indicative of decreased left   ventricular diastolic compliance and/or increased left atrial   pressure (grade 3 diastolic dysfunction). - Aortic valve: Mildly calcified annulus. Trileaflet; normal   thickness leaflets. - Mitral valve: Moderately calcified annulus. - Left atrium: The atrium was mildly to moderately  dilated. - Right atrium: The atrium was mildly dilated. - Pulmonary arteries: Systolic pressure was mildly increased. PA   peak pressure: 39 mm Hg (S).   Anesthesia Physical Anesthesia Plan  ASA: III  Anesthesia Plan: MAC   Post-op Pain Management:    Induction: Intravenous  PONV Risk Score and Plan:   Airway Management Planned: Mask  Additional Equipment:   Intra-op Plan:   Post-operative Plan:   Informed Consent: I have reviewed the patients History and Physical, chart, labs and discussed the procedure including the risks, benefits and alternatives for the proposed anesthesia with the patient or authorized representative who has indicated his/her understanding and acceptance.     Plan Discussed with: CRNA  Anesthesia Plan Comments:         Anesthesia Quick Evaluation

## 2017-01-10 ENCOUNTER — Encounter (HOSPITAL_COMMUNITY): Payer: Self-pay | Admitting: Internal Medicine

## 2017-01-12 ENCOUNTER — Ambulatory Visit (INDEPENDENT_AMBULATORY_CARE_PROVIDER_SITE_OTHER): Payer: Medicare Other | Admitting: Internal Medicine

## 2017-01-12 ENCOUNTER — Encounter: Payer: Self-pay | Admitting: Internal Medicine

## 2017-01-12 VITALS — BP 116/74 | HR 61 | Temp 97.7°F | Ht 59.0 in | Wt 114.0 lb

## 2017-01-12 DIAGNOSIS — S42412D Displaced simple supracondylar fracture without intercondylar fracture of left humerus, subsequent encounter for fracture with routine healing: Secondary | ICD-10-CM | POA: Diagnosis not present

## 2017-01-12 DIAGNOSIS — I10 Essential (primary) hypertension: Secondary | ICD-10-CM | POA: Diagnosis not present

## 2017-01-12 DIAGNOSIS — Z23 Encounter for immunization: Secondary | ICD-10-CM

## 2017-01-12 DIAGNOSIS — R269 Unspecified abnormalities of gait and mobility: Secondary | ICD-10-CM | POA: Diagnosis not present

## 2017-01-12 DIAGNOSIS — R296 Repeated falls: Secondary | ICD-10-CM | POA: Diagnosis not present

## 2017-01-12 DIAGNOSIS — I483 Typical atrial flutter: Secondary | ICD-10-CM

## 2017-01-12 MED ORDER — PREGABALIN 50 MG PO CAPS: 50.0000 mg | ORAL_CAPSULE | Freq: Three times a day (TID) | ORAL | 1 refills | Status: DC

## 2017-01-12 NOTE — Patient Instructions (Signed)
Please continue all other medications as before, and refills have been done if requested.  Please have the pharmacy call with any other refills you may need.  Please continue your efforts at being more active, low cholesterol diet, and weight control.  You are otherwise up to date with prevention measures today.  Please keep your appointments with your specialists as you may have planned  You will be contacted regarding the referral for: [facility]) with RN, PT, OT, and aide  You will be contacted regarding the referral for: [place] (if ok with insurance and qualifies)

## 2017-01-12 NOTE — Progress Notes (Signed)
Subjective:    Patient ID: Cathy [name], female    DOB: 07/15/1927, 83 y.o.   MRN: [id number]  HPI  Here after recent seen in ED Nov 7 after mechanical fall with fx left distal humerus, with long cast applied and pt was to f/u Nov 9 with ortho Dr Percell Miller.  Also struck head, on eliquis, head CT neg for acute Has fallen 3 times in 6 weeks, after now living with daughter with her today.  The first lead to an area on the back of concern, then second time tripped on a throw rug, had 2 rib fx now healed.  Overall doing better, daughter asking about HH.  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  Daughter states her previous referral to [facility] resyulted in a phone call from a nurse, but never had a visit done to the home.  Was walking with cane prior to last fall, now walking 1 handed with walker with daughter help.  Also s/p recent cardioversion per Dr Hilty/cards for typical a flutter.  Had elevated LFTs noted mild oct 3 , this was before the amiodarone and after the right sided rib fx.  No other interal hx Past Medical History:  Diagnosis Date  . Allergic rhinitis   . Anemia   . Anxiety   . Chronic systolic CHF (congestive heart failure) ([place])    a. 2D Echo 02/02/15: EF 25-30%, akinesis of mid-apical anteroseptal myocardium, grade 3 DD, mod MR, severely dilated LA, mildly reduced RV systoluc function, mod dilated RA, mod TR, mildly increased PASP 62mmHg. b. 10/2016: EF 55-60% with no regional WMA. Grade 3 DD.   [name] Colitis    lymphocytic colitis feb 2011  . Coronary artery calcification    a. By CT 01/2015.  . Diverticulosis of colon   . DJD (degenerative joint disease)    gets epidural injections  . Dysphagia   . Hiatal hernia   . Hypercholesteremia   . Hypertension   . Iron deficiency   . Irritable bowel syndrome   . Liver lesion 02/18/2015  . Lumbar back pain   . Osteoporosis   . Paroxysmal atrial flutter ([place])    a. Dx 01/2015 - underwent DCCV 03/2015; takes Eliquis  . Sinus  bradycardia   . Splenic lesion 02/18/2015  . Urinary incontinence   . Venous insufficiency   . Vitamin B12 deficiency    Past Surgical History:  Procedure Laterality Date  . ABDOMINAL HYSTERECTOMY    . CARDIOVERSION N/A 01/07/2017   Performed by Pixie Casino, MD at [facility] ENDOSCOPY  . CARDIOVERSION N/A 03/10/2015   Performed by Dorothy Spark, MD at [facility]  . CARDIOVERSION N/A 02/03/2015   Performed by Sueanne Margarita, MD at [facility] ENDOSCOPY  . cataract surgery    . decompressive lumbar laminectomy  06/2008   at L2-3, L3-4 and L4-5 by Dr. Ronnald Ramp  . LAPAROSCOPIC CHOLECYSTECTOMY  04/2009   Dr. Abran Cantor  . REPLACEMENT TOTAL KNEE    . TRANSESOPHAGEAL ECHOCARDIOGRAM (TEE) N/A 02/03/2015   Performed by Sueanne Margarita, MD at [place]    reports that she has quit smoking. Her smoking use included cigarettes. She has a 5.00 pack-year smoking history. she has never used smokeless tobacco. She reports that she does not drink alcohol or use drugs. family history includes Diabetes in her brother; Heart attack in her brother and mother. Allergies  Allergen Reactions  . Amoxicillin-Pot Clavulanate Diarrhea    Has patient had  a PCN reaction causing immediate rash, facial/tongue/throat swelling, SOB or lightheadedness with hypotension: no Has patient had a PCN reaction causing severe rash involving mucus membranes or skin necrosis:  no Has patient had a PCN reaction that required hospitalization: no Has patient had a PCN reaction occurring within the last 10 years no If all of the above answers are "NO", then may proceed with Cephalosporin use.   . Sulfonamide Derivatives Other (See Comments)    unknown   Review of Systems  Constitutional: Negative for other unusual diaphoresis or sweats HENT: Negative for ear discharge or swelling Eyes: Negative for other worsening visual disturbances Respiratory: Negative for stridor or other swelling  Gastrointestinal: Negative for worsening  distension or other blood Genitourinary: Negative for retention or other urinary change Musculoskeletal: Negative for other MSK pain or swelling Skin: Negative for color change or other new lesions Neurological: Negative for worsening tremors and other numbness  Psychiatric/Behavioral: Negative for worsening agitation or other fatigue All other system neg per pt    Objective:   Physical Exam BP 116/74   Pulse 61   Temp 97.7 F (36.5 C) (Oral)   Ht 4\' 11"  (1.499 m)   Wt 114 lb (51.7 kg)   SpO2 98%   BMI 23.03 kg/m  VS noted,  Constitutional: Pt appears in NAD HENT: Head: NCAT.  Right Ear: External ear normal.  Left Ear: External ear normal.  Eyes: . Pupils are equal, round, and reactive to light. Conjunctivae and EOM are normal Nose: without d/c or deformity Neck: Neck supple. Gross normal ROM Cardiovascular: Normal rate and regular rhythm.   Pulmonary/Chest: Effort normal and breath sounds without rales or wheezing.  Neurological: Pt is alert. At baseline orientation, motor grossly intact Skin: Skin is warm. No rashes, other new lesions, no LE edema Psychiatric: Pt behavior is normal without agitation  No other exam findings  Lab Results  Component Value Date   WBC 10.2 12/29/2016   HGB 11.6 (L) 01/07/2017   HCT 34.0 (L) 01/07/2017   PLT 238 12/29/2016   GLUCOSE 101 (H) 01/07/2017   CHOL 132 04/01/2016   TRIG 57.0 04/01/2016   HDL 64.80 04/01/2016   LDLDIRECT 158.1 05/29/2008   LDLCALC 56 04/01/2016   ALT 128 (H) 12/01/2016   AST 56 (H) 12/01/2016   NA 139 01/07/2017   K 4.2 01/07/2017   CL 99 12/29/2016   CREATININE 0.96 12/29/2016   BUN 14 12/29/2016   CO2 31 (H) 12/29/2016   TSH 3.204 12/01/2016   INR 1.54 11/29/2016   HGBA1C 5.6 04/01/2016      Assessment & Plan:

## 2017-01-15 DIAGNOSIS — S42412A Displaced simple supracondylar fracture without intercondylar fracture of left humerus, initial encounter for closed fracture: Secondary | ICD-10-CM | POA: Insufficient documentation

## 2017-01-15 DIAGNOSIS — R269 Unspecified abnormalities of gait and mobility: Secondary | ICD-10-CM | POA: Insufficient documentation

## 2017-01-15 DIAGNOSIS — R296 Repeated falls: Secondary | ICD-10-CM | POA: Insufficient documentation

## 2017-01-15 NOTE — Assessment & Plan Note (Addendum)
Pain controlled, f/u ortho as planned  Note:  Total time for pt hx, exam, review of record with pt in the room, determination of diagnoses and plan for further eval and tx is > 40 min, with over 50% spent in coordination and counseling of patient, including the differential dx, tx, further evaluation and other management of left humerus fracture, gait d/o, recurrent falls, HTN and aflutter

## 2017-01-15 NOTE — Assessment & Plan Note (Signed)
S/p cardioversion, stable, cont to follow

## 2017-01-15 NOTE — Assessment & Plan Note (Signed)
For PT as above 

## 2017-01-15 NOTE — Assessment & Plan Note (Signed)
For [place] with RN, PT, OT and aide,  to f/u any worsening symptoms or concerns

## 2017-01-15 NOTE — Assessment & Plan Note (Signed)
stable overall by history and exam, recent data reviewed with pt, and pt to continue medical treatment as before,  to f/u any worsening symptoms or concerns BP Readings from Last 3 Encounters:  01/12/17 116/74  01/07/17 137/64  01/05/17 128/73

## 2017-02-02 ENCOUNTER — Ambulatory Visit: Payer: Medicare Other | Admitting: Cardiology

## 2017-02-08 ENCOUNTER — Ambulatory Visit: Payer: Medicare Other | Admitting: Cardiology

## 2017-02-09 ENCOUNTER — Telehealth: Payer: Self-pay | Admitting: Internal Medicine

## 2017-02-09 MED ORDER — FUROSEMIDE 20 MG PO TABS: 20.0000 mg | ORAL_TABLET | Freq: Every day | ORAL | 3 refills | Status: DC

## 2017-02-09 NOTE — Telephone Encounter (Signed)
Copied from [place] #[id number]. Topic: General - Other >> Feb 09, 2017  1:13 PM Darl Householder, RMA wrote: Medication refill request for furosemide 20 mg to be sent to [facility]

## 2017-02-09 NOTE — Telephone Encounter (Signed)
Ok for refill lasix 20 mg daily  Please make ROV early next wk with checking daily AM wts at home

## 2017-02-09 NOTE — Telephone Encounter (Signed)
Copied from [place] #[id number]. Topic: General - Other >> Feb 09, 2017  1:08 PM Darl Householder, RMA wrote: Reason for CRM: Caryl Pina from [facility] called to report patient is gaining weight vry good last week her wt was 118 this week she is 125lbs, she is having pitting edema in bilateral legs, pt is out of fluid pills and will need a refill

## 2017-02-10 ENCOUNTER — Telehealth: Payer: Self-pay | Admitting: Cardiology

## 2017-02-10 DIAGNOSIS — R600 Localized edema: Secondary | ICD-10-CM

## 2017-02-10 DIAGNOSIS — Z79899 Other long term (current) drug therapy: Secondary | ICD-10-CM

## 2017-02-10 NOTE — Telephone Encounter (Signed)
F/U Call:  Patient daughter (cookie) calling, states that patient is retaining fluid in legs and requests a call back.

## 2017-02-10 NOTE — Telephone Encounter (Signed)
Medication filled on 02/09/17.

## 2017-02-10 NOTE — Telephone Encounter (Signed)
Dtr tells me pt has gained about 5/6 pds over the last week, mostly in her legs. She was [facility] Lasix [facility] and dtr states this has been working Rec[employer] consultant.  But after rx was complete, pt began to swell again in her LE. Only symptom pt reports is SOB. Dtr understands I will forward to Dr. Curt Bears to advise on and call her back today/tomorrow with recommendation/s.  (Dr. Curt Bears -- pt does not have a primary cardiologist. I confirmed this with dtr)   (If he agrees to refill dtr wants rx called in to [facility].  Call dtr at [phone])

## 2017-02-10 NOTE — Telephone Encounter (Signed)
New Message      Pt c/o swelling: STAT is pt has developed SOB within 24 hours  1) How much weight have you gained and in what time span?  8lbs was 118  To 126lb  2) If swelling, where is the swelling located?  legs  3) Are you currently taking a fluid pill?  No she is out   4) Are you currently SOB? no  5) Do you have a log of your daily weights (if so, list)?  no  6) Have you gained 3 pounds in a day or 5 pounds in a week? About a week   7) Have you traveled recently? no

## 2017-02-11 MED ORDER — FUROSEMIDE 20 MG PO TABS: 20.0000 mg | ORAL_TABLET | ORAL | 6 refills | Status: DC

## 2017-02-11 NOTE — Telephone Encounter (Signed)
F/u Message  Pt daughter call to f/u with RN.

## 2017-02-11 NOTE — Telephone Encounter (Signed)
Advised to restart Lasix.  Advised to take 40 mg x 3 days, then restart 20 mg and take every other day. Pt will stop by the office next Thursday for follow up lab work. Dtr verbalized understanding and agreeable to plan.

## 2017-02-16 ENCOUNTER — Telehealth: Payer: Self-pay | Admitting: Internal Medicine

## 2017-02-16 NOTE — Telephone Encounter (Signed)
Ok for verbals 

## 2017-02-16 NOTE — Telephone Encounter (Signed)
Verbal orders given  

## 2017-02-16 NOTE — Telephone Encounter (Signed)
Copied from [place] [phone]. Topic: Quick Communication - See Telephone Encounter >> Feb 16, 2017 11:04 AM [place], Laban Emperor wrote: CRM for notification. See Telephone encounter for:  Almira from [facility] is requsting PT services next week for 1 week 2 and for 2 week 2.  Contact number [phone] (can leave a vmail) 02/16/17.

## 2017-02-17 ENCOUNTER — Other Ambulatory Visit: Payer: Medicare Other | Admitting: *Deleted

## 2017-02-17 DIAGNOSIS — R600 Localized edema: Secondary | ICD-10-CM

## 2017-02-17 DIAGNOSIS — Z79899 Other long term (current) drug therapy: Secondary | ICD-10-CM

## 2017-02-18 LAB — BASIC METABOLIC PANEL
BUN / CREAT RATIO: 14 (ref 12–28)
BUN: 12 mg/dL (ref 8–27)
CO2: 27 mmol/L (ref 20–29)
CREATININE: 0.85 mg/dL (ref 0.57–1.00)
Calcium: 9.1 mg/dL (ref 8.7–10.3)
Chloride: 102 mmol/L (ref 96–106)
GFR calc non Af Amer: 62 mL/min/{1.73_m2} (ref >59)
GFR, EST AFRICAN AMERICAN: 71 mL/min/{1.73_m2} (ref >59)
Glucose: 115 mg/dL — ABNORMAL HIGH (ref 65–99)
Potassium: 3.6 mmol/L (ref 3.5–5.2)
Sodium: 143 mmol/L (ref 134–144)

## 2017-02-26 ENCOUNTER — Other Ambulatory Visit: Payer: Self-pay | Admitting: Cardiology

## 2017-02-28 ENCOUNTER — Telehealth: Payer: Self-pay | Admitting: Internal Medicine

## 2017-02-28 DIAGNOSIS — L989 Disorder of the skin and subcutaneous tissue, unspecified: Secondary | ICD-10-CM

## 2017-02-28 NOTE — Telephone Encounter (Signed)
Copied from [place] [phone]. Topic: Referral - Request >> Feb 28, 2017  9:46 AM Cathy [name] wrote: Reason for CRM: pt has a place, a spot on her face,(her forehead) that kind of leaks pus. Pt would like a referral to a dermatologist

## 2017-03-02 NOTE — Telephone Encounter (Signed)
Ok - done per [employer]

## 2017-03-02 NOTE — Telephone Encounter (Signed)
See below

## 2017-03-18 ENCOUNTER — Telehealth: Payer: Self-pay | Admitting: Internal Medicine

## 2017-03-18 NOTE — Telephone Encounter (Signed)
Ok for verbals 

## 2017-03-18 NOTE — Telephone Encounter (Signed)
Verbal orders given to [facility]

## 2017-03-18 NOTE — Telephone Encounter (Signed)
Copied from [place] [phone]. Topic: Inquiry >> Mar 18, 2017 11:17 AM Pricilla Handler wrote: Reason for CRM: Myra from [facility] (PHN # [phone]) called stating that She needs verbal order approvals for PT: Twice a Week for 5 Weeks and OT: Twice a week for 2 Weeks and Once a week for 2 Weeks. Please call Myra at [phone] with order approval.       Thank You!!!

## 2017-03-23 ENCOUNTER — Ambulatory Visit: Payer: Medicare Other | Admitting: Cardiology

## 2017-03-23 ENCOUNTER — Telehealth: Payer: Self-pay | Admitting: Internal Medicine

## 2017-03-23 NOTE — Telephone Encounter (Signed)
Copied from [place]. Topic: Inquiry >> Mar 23, 2017  4:41 PM Patrice Paradise wrote: Reason for CRM: Jenny Reichmann w/[facility] [phone] to get verbal orders to continue OT with the patient for 2 times a week for 2 weeks, then 2 times a week the last week.

## 2017-03-24 NOTE — Telephone Encounter (Signed)
Called Cathy [name] no answer LMOM w/MD response...Cathy [name]

## 2017-03-24 NOTE — Telephone Encounter (Signed)
Ok for verbals 

## 2017-03-25 DIAGNOSIS — S42409A Unspecified fracture of lower end of unspecified humerus, initial encounter for closed fracture: Secondary | ICD-10-CM | POA: Insufficient documentation

## 2017-04-01 ENCOUNTER — Telehealth: Payer: Self-pay | Admitting: Internal Medicine

## 2017-04-01 NOTE — Telephone Encounter (Signed)
Copied from [place]. Topic: General - Other >> Apr 01, 2017 12:29 PM Lolita Rieger, RMA wrote: Reason for CRM: pt weight 117 from Will from [facility] call back# [phone]

## 2017-04-06 ENCOUNTER — Other Ambulatory Visit: Payer: Self-pay | Admitting: Dermatology

## 2017-04-07 ENCOUNTER — Ambulatory Visit: Payer: Medicare Other | Admitting: Internal Medicine

## 2017-04-11 ENCOUNTER — Ambulatory Visit (INDEPENDENT_AMBULATORY_CARE_PROVIDER_SITE_OTHER): Payer: Medicare Other | Admitting: Internal Medicine

## 2017-04-11 ENCOUNTER — Encounter: Payer: Self-pay | Admitting: Internal Medicine

## 2017-04-11 ENCOUNTER — Other Ambulatory Visit (INDEPENDENT_AMBULATORY_CARE_PROVIDER_SITE_OTHER): Payer: Medicare Other

## 2017-04-11 VITALS — BP 112/64 | HR 86 | Temp 98.0°F | Ht 59.0 in | Wt 118.0 lb

## 2017-04-11 DIAGNOSIS — E7800 Pure hypercholesterolemia, unspecified: Secondary | ICD-10-CM | POA: Diagnosis not present

## 2017-04-11 DIAGNOSIS — R739 Hyperglycemia, unspecified: Secondary | ICD-10-CM

## 2017-04-11 DIAGNOSIS — Z0000 Encounter for general adult medical examination without abnormal findings: Secondary | ICD-10-CM | POA: Diagnosis not present

## 2017-04-11 LAB — CBC WITH DIFFERENTIAL/PLATELET
BASOS ABS: 0 10*3/uL (ref 0.0–0.1)
Basophils Relative: 0.4 % (ref 0.0–3.0)
EOS ABS: 0 10*3/uL (ref 0.0–0.7)
Eosinophils Relative: 0.6 % (ref 0.0–5.0)
HEMATOCRIT: 35.6 % — AB (ref 36.0–46.0)
HEMOGLOBIN: 12.2 g/dL (ref 12.0–15.0)
LYMPHS PCT: 14.5 % (ref 12.0–46.0)
Lymphs Abs: 1.2 10*3/uL (ref 0.7–4.0)
MCHC: 34.1 g/dL (ref 30.0–36.0)
MCV: 90.5 fl (ref 78.0–100.0)
Monocytes Absolute: 0.8 10*3/uL (ref 0.1–1.0)
Monocytes Relative: 9.1 % (ref 3.0–12.0)
NEUTROS ABS: 6.4 10*3/uL (ref 1.4–7.7)
Neutrophils Relative %: 75.4 % (ref 43.0–77.0)
PLATELETS: 194 10*3/uL (ref 150.0–400.0)
RBC: 3.94 Mil/uL (ref 3.87–5.11)
RDW: 15.2 % (ref 11.5–15.5)
WBC: 8.5 10*3/uL (ref 4.0–10.5)

## 2017-04-11 LAB — URINALYSIS, ROUTINE W REFLEX MICROSCOPIC
BILIRUBIN URINE: NEGATIVE
HGB URINE DIPSTICK: NEGATIVE
Ketones, ur: NEGATIVE
NITRITE: NEGATIVE
Specific Gravity, Urine: 1.01 (ref 1.000–1.030)
Total Protein, Urine: NEGATIVE
Urine Glucose: NEGATIVE
Urobilinogen, UA: 0.2 (ref 0.0–1.0)
pH: 7 (ref 5.0–8.0)

## 2017-04-11 LAB — BASIC METABOLIC PANEL
BUN: 17 mg/dL (ref 6–23)
CO2: 31 mEq/L (ref 19–32)
CREATININE: 0.99 mg/dL (ref 0.40–1.20)
Calcium: 9.1 mg/dL (ref 8.4–10.5)
Chloride: 98 mEq/L (ref 96–112)
GFR: 56.33 mL/min — AB (ref >60.00)
GLUCOSE: 84 mg/dL (ref 70–99)
Potassium: 4.4 mEq/L (ref 3.5–5.1)
Sodium: 137 mEq/L (ref 135–145)

## 2017-04-11 LAB — TSH: TSH: 6.98 u[IU]/mL — ABNORMAL HIGH (ref 0.35–4.50)

## 2017-04-11 LAB — LIPID PANEL
Cholesterol: 128 mg/dL (ref 0–200)
HDL: 63.3 mg/dL (ref >39.00)
LDL CALC: 54 mg/dL (ref 0–99)
NONHDL: 64.43
Total CHOL/HDL Ratio: 2
Triglycerides: 54 mg/dL (ref 0.0–149.0)
VLDL: 10.8 mg/dL (ref 0.0–40.0)

## 2017-04-11 LAB — HEPATIC FUNCTION PANEL
ALK PHOS: 55 U/L (ref 39–117)
ALT: 17 U/L (ref 0–35)
AST: 19 U/L (ref 0–37)
Albumin: 3.7 g/dL (ref 3.5–5.2)
BILIRUBIN DIRECT: 0.1 mg/dL (ref 0.0–0.3)
TOTAL PROTEIN: 6.2 g/dL (ref 6.0–8.3)
Total Bilirubin: 0.7 mg/dL (ref 0.2–1.2)

## 2017-04-11 LAB — HEMOGLOBIN A1C: HEMOGLOBIN A1C: 5.5 % (ref 4.6–6.5)

## 2017-04-11 NOTE — Assessment & Plan Note (Signed)

## 2017-04-11 NOTE — Patient Instructions (Signed)

## 2017-04-11 NOTE — Assessment & Plan Note (Signed)
Lab Results  Component Value Date   HGBA1C 5.5 04/11/2017   stable overall by history and exam, recent data reviewed with pt, and pt to continue medical treatment as before,  to f/u any worsening symptoms or concerns

## 2017-04-11 NOTE — Progress Notes (Signed)
Subjective:    Patient ID: Cathy [name], female    DOB: Nov 03, 1927, 84 y.o.   MRN: [id number]  HPI  Here for wellness and f/u;  Overall doing ok;  Pt denies Chest pain, worsening SOB, DOE, wheezing, orthopnea, PND, worsening LE edema, palpitations, dizziness or syncope.  Pt denies neurological change such as new headache, facial or extremity weakness.  Pt denies polydipsia, polyuria, or low sugar symptoms. Pt states overall good compliance with treatment and medications, good tolerability, and has been trying to follow appropriate diet.  Pt denies worsening depressive symptoms, suicidal ideation or panic. No fever, night sweats, wt loss, loss of appetite, or other constitutional symptoms.  Pt states good ability with ADL's, has low fall risk, home safety reviewed and adequate, no other significant changes in hearing or vision, and not active with exercise, but remains active for age.  Getting PT and no interval hx or new complaints Past Medical History:  Diagnosis Date  . Allergic rhinitis   . Anemia   . Anxiety   . Chronic systolic CHF (congestive heart failure) ([place])    a. 2D Echo 02/02/15: EF 25-30%, akinesis of mid-apical anteroseptal myocardium, grade 3 DD, mod MR, severely dilated LA, mildly reduced RV systoluc function, mod dilated RA, mod TR, mildly increased PASP 72mmHg. b. 10/2016: EF 55-60% with no regional WMA. Grade 3 DD.   [name] Colitis    lymphocytic colitis feb 2011  . Coronary artery calcification    a. By CT 01/2015.  . Diverticulosis of colon   . DJD (degenerative joint disease)    gets epidural injections  . Dysphagia   . Hiatal hernia   . Hypercholesteremia   . Hypertension   . Iron deficiency   . Irritable bowel syndrome   . Liver lesion 02/18/2015  . Lumbar back pain   . Osteoporosis   . Paroxysmal atrial flutter ([place])    a. Dx 01/2015 - underwent DCCV 03/2015; takes Eliquis  . Sinus bradycardia   . Splenic lesion 02/18/2015  . Urinary incontinence   . Venous  insufficiency   . Vitamin B12 deficiency    Past Surgical History:  Procedure Laterality Date  . ABDOMINAL HYSTERECTOMY    . CARDIOVERSION N/A 02/03/2015   Procedure: CARDIOVERSION;  Surgeon: Sueanne Margarita, MD;  Location: [facility] ENDOSCOPY;  Service: Cardiovascular;  Laterality: N/A;  . CARDIOVERSION N/A 03/10/2015   Procedure: CARDIOVERSION;  Surgeon: Dorothy Spark, MD;  Location: [facility];  Service: Cardiovascular;  Laterality: N/A;  . CARDIOVERSION N/A 01/07/2017   Procedure: CARDIOVERSION;  Surgeon: Pixie Casino, MD;  Location: [facility] ENDOSCOPY;  Service: Cardiovascular;  Laterality: N/A;  . cataract surgery    . decompressive lumbar laminectomy  06/2008   at L2-3, L3-4 and L4-5 by Dr. Ronnald Ramp  . LAPAROSCOPIC CHOLECYSTECTOMY  04/2009   Dr. Abran Cantor  . REPLACEMENT TOTAL KNEE    . TEE WITHOUT CARDIOVERSION N/A 02/03/2015   Procedure: TRANSESOPHAGEAL ECHOCARDIOGRAM (TEE);  Surgeon: Sueanne Margarita, MD;  Location: [facility] ENDOSCOPY;  Service: Cardiovascular;  Laterality: N/A;    reports that she has quit smoking. Her smoking use included cigarettes. She has a 5.00 pack-year smoking history. she has never used smokeless tobacco. She reports that she does not drink alcohol or use drugs. family history includes Diabetes in her brother; Heart attack in her brother and mother. Allergies  Allergen Reactions  . Amoxicillin-Pot Clavulanate Diarrhea    Has patient had a PCN reaction causing immediate rash, facial/tongue/throat swelling, SOB or  lightheadedness with hypotension: no Has patient had a PCN reaction causing severe rash involving mucus membranes or skin necrosis:  no Has patient had a PCN reaction that required hospitalization: no Has patient had a PCN reaction occurring within the last 10 years no If all of the above answers are "NO", then may proceed with Cephalosporin use.   . Sulfonamide Derivatives Other (See Comments)    unknown   Current Outpatient Medications on File Prior to Visit    Medication Sig Dispense Refill  . amiodarone (PACERONE) 200 MG tablet Take 1 tablet (200 mg total) by mouth daily. 90 tablet 1  . atorvastatin (LIPITOR) 20 MG tablet TAKE 1 TABLET DAILY AT 6 P.M. 90 tablet 2  . benzonatate (TESSALON) 100 MG capsule TAKE 1 CAPSULE(100 MG) BY MOUTH TWICE DAILY AS NEEDED FOR COUGH 60 capsule 0  . calcium-vitamin D (OSCAL WITH D) 500-200 MG-UNIT per tablet Take 1 tablet by mouth daily.     . cholecalciferol (VITAMIN D) 1000 UNITS tablet Take 1,000 Units by mouth daily.    [name] ELIQUIS 2.5 MG TABS tablet TAKE 1 TABLET TWICE A DAY 180 tablet 1  . esomeprazole (NEXIUM) 40 MG capsule TAKE 1 CAPSULE DAILY 90 capsule 1  . Ferrous Sulfate Dried (FEOSOL) 200 (65 Fe) MG TABS Take 1 tablet by mouth every Monday, Wednesday, and Friday. Take with a vitamin c 500mg  30 tablet 0  . fexofenadine (ALLEGRA) 180 MG tablet Take 180 mg by mouth daily as needed for allergies or rhinitis.    . furosemide (LASIX) 20 MG tablet Take 1 tablet (20 mg total) by mouth every other day. 30 tablet 6  . isosorbide mononitrate (IMDUR) 30 MG 24 hr tablet Take 1 tablet (30 mg total) by mouth daily. 90 tablet 3  . polyethylene glycol (MIRALAX / GLYCOLAX) packet Take 17 g by mouth 2 (two) times daily. (Patient taking differently: Take 17 g by mouth daily as needed for mild constipation or moderate constipation. ) 28 each 0  . pregabalin (LYRICA) 50 MG capsule Take 1 capsule (50 mg total) 3 (three) times daily by mouth. 270 capsule 1  . senna-docusate (SENOKOT-S) 8.6-50 MG tablet Take 1 tablet by mouth at bedtime. 30 tablet 0  . solifenacin (VESICARE) 10 MG tablet Take 1 tablet (10 mg total) by mouth daily. 90 tablet 1  . vitamin B-12 (CYANOCOBALAMIN) 1000 MCG tablet Take 1 tablet (1,000 mcg total) by mouth daily.     No current facility-administered medications on file prior to visit.    Review of Systems Constitutional: Negative for other unusual diaphoresis, sweats, appetite or weight changes HENT:  Negative for other worsening hearing loss, ear pain, facial swelling, mouth sores or neck stiffness.   Eyes: Negative for other worsening pain, redness or other visual disturbance.  Respiratory: Negative for other stridor or swelling Cardiovascular: Negative for other palpitations or other chest pain  Gastrointestinal: Negative for worsening diarrhea or loose stools, blood in stool, distention or other pain Genitourinary: Negative for hematuria, flank pain or other change in urine volume.  Musculoskeletal: Negative for myalgias or other joint swelling.  Skin: Negative for other color change, or other wound or worsening drainage.  Neurological: Negative for other syncope or numbness. Hematological: Negative for other adenopathy or swelling Psychiatric/Behavioral: Negative for hallucinations, other worsening agitation, SI, self-injury, or new decreased concentration All other system neg per pt    Objective:   Physical Exam BP 112/64   Pulse 86   Temp 98 F (36.7 C) (  Oral)   Ht 4\' 11"  (1.499 m)   Wt 118 lb (53.5 kg)   SpO2 94%   BMI 23.83 kg/m  VS noted, somewhat frail but gets up on exam table with minimal assist Constitutional: Pt is oriented to person, place, and time. Appears well-developed and well-nourished, in no significant distress and comfortable Head: Normocephalic and atraumatic  Eyes: Conjunctivae and EOM are normal. Pupils are equal, round, and reactive to light Right Ear: External ear normal without discharge Left Ear: External ear normal without discharge Nose: Nose without discharge or deformity Mouth/Throat: Oropharynx is without other ulcerations and moist  Neck: Normal range of motion. Neck supple. No JVD present. No tracheal deviation present or significant neck LA or mass Cardiovascular: Normal rate, regular rhythm, normal heart sounds and intact distal pulses.   Pulmonary/Chest: WOB normal and breath sounds without rales or wheezing  Abdominal: Soft. Bowel  sounds are normal. NT. No HSM  Musculoskeletal: Normal range of motion. Exhibits no edema Lymphadenopathy: Has no other cervical adenopathy.  Neurological: Pt is alert and oriented to person, place, and time. Pt has normal reflexes. No cranial nerve deficit. Motor grossly intact, Gait intact Skin: Skin is warm and dry. No rash noted or new ulcerations Psychiatric:  Has normal mood and affect. Behavior is normal without agitation No other exam findings Lab Results  Component Value Date   WBC 8.5 04/11/2017   HGB 12.2 04/11/2017   HCT 35.6 (L) 04/11/2017   PLT 194.0 04/11/2017   GLUCOSE 84 04/11/2017   CHOL 128 04/11/2017   TRIG 54.0 04/11/2017   HDL 63.30 04/11/2017   LDLDIRECT 158.1 05/29/2008   LDLCALC 54 04/11/2017   ALT 17 04/11/2017   AST 19 04/11/2017   NA 137 04/11/2017   K 4.4 04/11/2017   CL 98 04/11/2017   CREATININE 0.99 04/11/2017   BUN 17 04/11/2017   CO2 31 04/11/2017   TSH 6.98 (H) 04/11/2017   INR 1.54 11/29/2016   HGBA1C 5.5 04/11/2017          Assessment & Plan:

## 2017-04-11 NOTE — Assessment & Plan Note (Signed)
Lab Results  Component Value Date   LDLCALC 54 04/11/2017  stable overall by history and exam, recent data reviewed with pt, and pt to continue medical treatment as before,  to f/u any worsening symptoms or concerns

## 2017-04-13 ENCOUNTER — Other Ambulatory Visit: Payer: Self-pay | Admitting: Cardiology

## 2017-04-17 NOTE — Progress Notes (Signed)
Electrophysiology Office Note   Date:  04/18/2017   ID:  Cathy [name], DOB 16-Dec-1927, MRN [id number]  PCP:  Biagio Borg, MD  Cardiologist:  Pernell Dupre Primary Electrophysiologist:  Constance Haw, MD    Chief Complaint  Patient presents with  . Follow-up    Typical AFlutter     History of Present Illness: Cathy [name] is a 84 y.o. female who presents today for electrophysiology evaluation.   She has a history of HTN, venous insufficiency, hypercholesterolemia, anemia, and recently diagnosed atrial flutter, combined CHF/LV dysfunction (EF 25-30%), and coronary artery calcification.  She was admitted to the hospital 01/2015 after being diagnosed with atrial flutter and 2-1 conduction.  CTA showed no PE.  TEE cardioversion was planned but was not able to adequately visualize the left atrial appendage.  She was started on amiodarone and Eliquis.  Echo 02/02/15 showed an EF of 25-30%.  Patient was seen back and cardioversion was arranged.  She was on amiodarone at the time.  Unfortunately, she fell and broke right-sided ribs and this cardioversion was not performed.  She returns to clinic today to discuss further plans for rhythm control  Today, denies symptoms of palpitations, chest pain, shortness of breath, orthopnea, PND, lower extremity edema, claudication, dizziness, presyncope, syncope, bleeding, or neurologic sequela. The patient is tolerating medications without difficulties.  Her main complaint today is fatigue.  She has not had shortness of breath or palpitations.  Lab results show an elevated TSH.  Past Medical History:  Diagnosis Date  . Allergic rhinitis   . Anemia   . Anxiety   . Chronic systolic CHF (congestive heart failure) ([place])    a. 2D Echo 02/02/15: EF 25-30%, akinesis of mid-apical anteroseptal myocardium, grade 3 DD, mod MR, severely dilated LA, mildly reduced RV systoluc function, mod dilated RA, mod TR, mildly increased PASP 75mmHg. b. 10/2016: EF 55-60%  with no regional WMA. Grade 3 DD.   [name] Colitis    lymphocytic colitis feb 2011  . Coronary artery calcification    a. By CT 01/2015.  . Diverticulosis of colon   . DJD (degenerative joint disease)    gets epidural injections  . Dysphagia   . Hiatal hernia   . Hypercholesteremia   . Hypertension   . Iron deficiency   . Irritable bowel syndrome   . Liver lesion 02/18/2015  . Lumbar back pain   . Osteoporosis   . Paroxysmal atrial flutter ([place])    a. Dx 01/2015 - underwent DCCV 03/2015; takes Eliquis  . Sinus bradycardia   . Splenic lesion 02/18/2015  . Urinary incontinence   . Venous insufficiency   . Vitamin B12 deficiency    Past Surgical History:  Procedure Laterality Date  . ABDOMINAL HYSTERECTOMY    . CARDIOVERSION N/A 02/03/2015   Procedure: CARDIOVERSION;  Surgeon: Sueanne Margarita, MD;  Location: [facility] ENDOSCOPY;  Service: Cardiovascular;  Laterality: N/A;  . CARDIOVERSION N/A 03/10/2015   Procedure: CARDIOVERSION;  Surgeon: Dorothy Spark, MD;  Location: [place];  Service: Cardiovascular;  Laterality: N/A;  . CARDIOVERSION N/A 01/07/2017   Procedure: CARDIOVERSION;  Surgeon: Pixie Casino, MD;  Location: [facility] ENDOSCOPY;  Service: Cardiovascular;  Laterality: N/A;  . cataract surgery    . decompressive lumbar laminectomy  06/2008   at L2-3, L3-4 and L4-5 by Dr. Ronnald Ramp  . LAPAROSCOPIC CHOLECYSTECTOMY  04/2009   Dr. Abran Cantor  . REPLACEMENT TOTAL KNEE    . TEE WITHOUT CARDIOVERSION N/A 02/03/2015  Procedure: TRANSESOPHAGEAL ECHOCARDIOGRAM (TEE);  Surgeon: Sueanne Margarita, MD;  Location: [facility] ENDOSCOPY;  Service: Cardiovascular;  Laterality: N/A;     Current Outpatient Medications  Medication Sig Dispense Refill  . amiodarone (PACERONE) 200 MG tablet Take 1 tablet (200 mg total) by mouth daily. 90 tablet 2  . atorvastatin (LIPITOR) 20 MG tablet TAKE 1 TABLET DAILY AT 6 P.M. 90 tablet 2  . calcium-vitamin D (OSCAL WITH D) 500-200 MG-UNIT per tablet Take 1 tablet by mouth  daily.     . cholecalciferol (VITAMIN D) 1000 UNITS tablet Take 1,000 Units by mouth daily.    [name] ELIQUIS 2.5 MG TABS tablet TAKE 1 TABLET TWICE A DAY 180 tablet 1  . esomeprazole (NEXIUM) 40 MG capsule TAKE 1 CAPSULE DAILY 90 capsule 1  . fexofenadine (ALLEGRA) 180 MG tablet Take 180 mg by mouth daily as needed for allergies or rhinitis.    . furosemide (LASIX) 20 MG tablet Take 1 tablet (20 mg total) by mouth every other day. 30 tablet 6  . isosorbide mononitrate (IMDUR) 30 MG 24 hr tablet Take 1 tablet (30 mg total) by mouth daily. 90 tablet 3  . polyethylene glycol (MIRALAX / GLYCOLAX) packet Take 17 g by mouth 2 (two) times daily. (Patient taking differently: Take 17 g by mouth daily as needed for mild constipation or moderate constipation. ) 28 each 0  . pregabalin (LYRICA) 50 MG capsule Take 1 capsule (50 mg total) 3 (three) times daily by mouth. 270 capsule 1  . senna-docusate (SENOKOT-S) 8.6-50 MG tablet Take 1 tablet by mouth at bedtime. 30 tablet 0  . solifenacin (VESICARE) 10 MG tablet Take 1 tablet (10 mg total) by mouth daily. 90 tablet 1  . vitamin B-12 (CYANOCOBALAMIN) 1000 MCG tablet Take 1 tablet (1,000 mcg total) by mouth daily.     No current facility-administered medications for this visit.     Allergies:   Amoxicillin-pot clavulanate and Sulfonamide derivatives   Social History:  The patient  reports that she has quit smoking. Her smoking use included cigarettes. She has a 5.00 pack-year smoking history. she has never used smokeless tobacco. She reports that she does not drink alcohol or use drugs.   Family History:  The patient's family history includes Diabetes in her brother; Heart attack in her brother and mother.   ROS:  Please see the history of present illness.   Otherwise, review of systems is positive for leg swelling, balance problems.   All other systems are reviewed and negative.   PHYSICAL EXAM: VS:  BP (!) 142/64   Pulse (!) 52   Ht 4\' 11"  (1.499 m)   Wt  118 lb (53.5 kg)   BMI 23.83 kg/m  , BMI Body mass index is 23.83 kg/m. GEN: Well nourished, well developed, in no acute distress  HEENT: normal  Neck: no JVD, carotid bruits, or masses Cardiac: RRR; no murmurs, rubs, or gallops,no edema  Respiratory:  clear to auscultation bilaterally, normal work of breathing GI: soft, nontender, nondistended, + BS MS: no deformity or atrophy  Skin: warm and dry Neuro:  Strength and sensation are intact Psych: euthymic mood, full affect  EKG:  EKG is ordered today. Personal review of the ekg ordered shows sinus rhythm, LBBB, LAE   Recent Labs: 11/27/2016: B Natriuretic Peptide 348.3 12/03/2016: Magnesium 1.9 04/11/2017: ALT 17; BUN 17; Creatinine, Ser 0.99; Hemoglobin 12.2; Platelets 194.0; Potassium 4.4; Sodium 137; TSH 6.98    Lipid Panel     Component  Value Date/Time   CHOL 128 04/11/2017 1536   TRIG 54.0 04/11/2017 1536   HDL 63.30 04/11/2017 1536   CHOLHDL 2 04/11/2017 1536   VLDL 10.8 04/11/2017 1536   LDLCALC 54 04/11/2017 1536   LDLDIRECT 158.1 05/29/2008 1137     Wt Readings from Last 3 Encounters:  04/18/17 118 lb (53.5 kg)  04/11/17 118 lb (53.5 kg)  01/12/17 114 lb (51.7 kg)      Other studies Reviewed: Additional studies/ records that were reviewed today include: TTE 02/02/15 Review of the above records today demonstrates:  - Left ventricle: The cavity size was normal. There was moderate concentric hypertrophy. Systolic function was severely reduced. The estimated ejection fraction was in the range of 25% to 30%. There is akinesis of the mid-apicalanteroseptal myocardium. Doppler parameters are consistent with a reversible restrictive pattern, indicative of decreased left ventricular diastolic compliance and/or increased left atrial pressure (grade 3 diastolic dysfunction). - Mitral valve: Moderately calcified annulus. There was moderate regurgitation. - Left atrium: The atrium was severely dilated.  Volume/bsa, ES (1-plane Simpson&'s, A4C): 64.3 ml/m^2. - Right ventricle: Systolic function was mildly reduced. - Right atrium: The atrium was moderately dilated. - Tricuspid valve: There was moderate regurgitation. - Pulmonary arteries: Systolic pressure was mildly increased. PA peak pressure: 33 mm Hg (S).   ASSESSMENT AND PLAN:  1. Atrial flutter: On Eliquis, and amiodarone.  Is in sinus rhythm today.  No changes.  This patients CHA2DS2-VASc Score and unadjusted Ischemic Stroke Rate (% per year) is equal to 4.8 % stroke rate/year from a score of 4  Above score calculated as 1 point each if present [CHF, HTN, DM, Vascular=MI/PAD/Aortic Plaque, Age if 65-74, or Female] Above score calculated as 2 points each if present [Age > 75, or Stroke/TIA/TE]  2. Chronic combined CHF -ejection fraction is since normalized.  No signs of volume overload. 3. Essential HTN -well-controlled.  No changes. 4. Hypothyroidism: TSH was elevated on lab check.  This could be part of the cause of her fatigue.  I recommended that she discuss this further with her primary physician.  Current medicines are reviewed at length with the patient today.   The patient does not have concerns regarding her medicines.  The following changes were made today:    Labs/ tests ordered today include:  Orders Placed This Encounter  Procedures  . EKG 12-Lead     Disposition:   FU with Deriona Altemose 6  months  Signed, Tauno Falotico Meredith Leeds, MD  04/18/2017 12:19 PM     [place] [address] [phone] (office) [phone] (fax)

## 2017-04-18 ENCOUNTER — Ambulatory Visit (INDEPENDENT_AMBULATORY_CARE_PROVIDER_SITE_OTHER): Payer: Medicare Other | Admitting: Cardiology

## 2017-04-18 ENCOUNTER — Encounter: Payer: Self-pay | Admitting: Cardiology

## 2017-04-18 VITALS — BP 142/64 | HR 52 | Ht 59.0 in | Wt 118.0 lb

## 2017-04-18 DIAGNOSIS — I4892 Unspecified atrial flutter: Secondary | ICD-10-CM | POA: Diagnosis not present

## 2017-04-18 DIAGNOSIS — I428 Other cardiomyopathies: Secondary | ICD-10-CM

## 2017-04-18 DIAGNOSIS — I10 Essential (primary) hypertension: Secondary | ICD-10-CM

## 2017-04-18 NOTE — Patient Instructions (Addendum)
Medication Instructions:  Your physician recommends that you continue on your current medications as directed. Please refer to the Current Medication list given to you today.  If you need a refill on your cardiac medications before your next appointment, please call your pharmacy.   Labwork: None ordered  Testing/Procedures: None ordered  Follow-Up: Your physician wants you to follow-up in: 6 months with Dr. Camnitz.  You will receive a reminder letter in the mail two months in advance. If you don't receive a letter, please call our office to schedule the follow-up appointment.  Thank you for choosing [facility]!!   Kharter Brew, RN [phone]         

## 2017-04-22 NOTE — Telephone Encounter (Signed)
Will given verbal orders for *OT, not PT. Original note was incorrect.

## 2017-04-22 NOTE — Telephone Encounter (Signed)
Copied from [place] [id number]. Topic: Inquiry >> Apr 22, 2017 11:24 AM Oliver Pila B wrote: Reason for CRM: [facility] is needing verbals for PT services for 2x/wk for 2wks and 1x for 1wk for discharge contact [facility]

## 2017-04-22 NOTE — Telephone Encounter (Signed)
Ok for verbals 

## 2017-05-03 ENCOUNTER — Encounter: Payer: Self-pay | Admitting: Rheumatology

## 2017-06-19 ENCOUNTER — Other Ambulatory Visit: Payer: Self-pay | Admitting: Internal Medicine

## 2017-06-21 DIAGNOSIS — G5600 Carpal tunnel syndrome, unspecified upper limb: Secondary | ICD-10-CM | POA: Insufficient documentation

## 2017-06-21 DIAGNOSIS — M19032 Primary osteoarthritis, left wrist: Secondary | ICD-10-CM | POA: Insufficient documentation

## 2017-06-21 DIAGNOSIS — M659 Synovitis and tenosynovitis, unspecified: Secondary | ICD-10-CM | POA: Insufficient documentation

## 2017-06-21 NOTE — Progress Notes (Signed)
We have not seen this patient since 2017.  This DEXA scan was not ordered by [place].  Please check with [facility] who ordered this bone density and the results can be forwarded to the ordering physician.

## 2017-06-22 DIAGNOSIS — M25832 Other specified joint disorders, left wrist: Secondary | ICD-10-CM | POA: Insufficient documentation

## 2017-06-28 ENCOUNTER — Other Ambulatory Visit: Payer: Self-pay | Admitting: Internal Medicine

## 2017-07-19 ENCOUNTER — Telehealth: Payer: Self-pay | Admitting: Rheumatology

## 2017-07-19 NOTE — Telephone Encounter (Signed)
Patient called stating she was returning Andrea's call.

## 2017-07-22 NOTE — Telephone Encounter (Signed)
Patient has been scheduled an office visit to discuss her DEXA scan.

## 2017-07-26 NOTE — Progress Notes (Signed)
Office Visit Note  Patient: Cathy [name]             Date of Birth: 11-Apr-1927           MRN: [id number]             PCP: Biagio Borg, MD Referring: Biagio Borg, MD Visit Date: 07/27/2017 Occupation: @GUAROCC @    Subjective:  Discuss restarting Prolia    History of Present Illness: Cathy [name] is a 84 y.o. female with history of osteoporosis and osteoarthritis.  She has not been on Prolia since 2017.  She is unaware of why she stopped receiving injections.  She continues to take Calicum and Vitamin D on a daily basis.  Her last DEXA was 05/03/17.  She states that on 11/18/16 she had a fall and sustained 2 anterior rib fractures and on 11/7/118 she had another fall and fractured her left elbow.  She denies any recent vertebral fractures or back pain at this time.  She reports she has been having increased pain in the left hand. Per patient she sees Dr. Amedeo Plenty for a left wrist aspiration She denies any knee pain but she reports her bilateral knees swell.  She walks with a walker when she is out and about but not when she is at home.    Activities of Daily Living:  Patient reports morning stiffness for 0  minutes.   Patient Denies nocturnal pain.  Difficulty dressing/grooming: Denies Difficulty climbing stairs: Reports Difficulty getting out of chair: Reports Difficulty using hands for taps, buttons, cutlery, and/or writing: Denies   Review of Systems  Constitutional: Positive for fatigue.  HENT: Positive for mouth dryness. Negative for mouth sores and nose dryness.   Eyes: Negative for pain, visual disturbance and dryness.  Respiratory: Negative for cough, hemoptysis, shortness of breath and difficulty breathing.   Cardiovascular: Negative for chest pain, palpitations, hypertension and swelling in legs/feet.  Gastrointestinal: Positive for constipation. Negative for blood in stool and diarrhea.  Endocrine: Negative for increased urination.  Genitourinary: Negative for painful  urination.  Musculoskeletal: Positive for arthralgias, joint pain, joint swelling, myalgias and myalgias. Negative for muscle weakness, morning stiffness and muscle tenderness.  Skin: Negative for color change, pallor, rash, hair loss, nodules/bumps, skin tightness, ulcers and sensitivity to sunlight.  Allergic/Immunologic: Negative for susceptible to infections.  Neurological: Negative for dizziness, numbness, headaches and weakness.  Hematological: Negative for swollen glands.  Psychiatric/Behavioral: Negative for depressed mood and sleep disturbance. The patient is not nervous/anxious.     PMFS History:  Patient Active Problem List   Diagnosis Date Noted  . Closed supracondylar fracture of left humerus 01/15/2017  . Gait disorder 01/15/2017  . Recurrent falls 01/15/2017  . Rib fractures 11/28/2016  . CHF exacerbation ([place]) 11/28/2016  . Diarrhea 11/27/2016  . Chest pain 11/18/2016  . Skin lesion 09/30/2016  . Ganglion cyst of dorsum of left wrist 09/30/2016  . Itching 09/30/2016  . Acute upper respiratory infection 09/30/2016  . Rotator cuff arthropathy, right 04/26/2016  . Hyperglycemia 04/01/2016  . Right arm pain 04/01/2016  . Wrist swelling 09/30/2015  . Acute URI 06/04/2015  . Preventative health care 04/02/2015  . UTI (urinary tract infection) 04/02/2015  . Chronic combined systolic and diastolic CHF (congestive heart failure) ([place]) 03/17/2015  . Coronary artery calcification seen on CT scan 03/17/2015  . Sinus bradycardia 03/17/2015  . Splenic lesion 02/18/2015  . Liver lesion 02/18/2015  . CAD (coronary artery disease), native coronary artery 02/03/2015  .  Atrial flutter ([place]) 02/02/2015  . Fatigue 01/31/2015  . Abnormal CT of the abdomen 01/31/2015  . Prolonged Q-T interval on ECG 01/31/2015  . Hepatic lesion   . Constipation 12/03/2014  . Lower extremity edema 04/25/2014  . Peripheral neuropathy ([place]) 12/10/2013  . Overactive bladder 12/27/2011  .  Osteoporosis 12/27/2011  . VITAMIN B12 DEFICIENCY 05/03/2010  . IRON DEFICIENCY 05/03/2010  . Anemia 05/03/2010  . CLOSTRIDIUM DIFFICILE COLITIS 05/26/2009  . VENOUS INSUFFICIENCY 01/27/2009  . BACK PAIN, LUMBAR 01/27/2008  . URINARY INCONTINENCE 08/23/2007  . DIVERTICULOSIS OF COLON 04/10/2007  . HYPERCHOLESTEROLEMIA 02/13/2007  . ANXIETY 02/13/2007  . Essential hypertension 02/13/2007  . HIATAL HERNIA 02/13/2007  . Irritable bowel syndrome 02/13/2007  . DEGENERATIVE JOINT DISEASE 02/13/2007  . Allergic rhinitis 02/13/2007    Past Medical History:  Diagnosis Date  . Allergic rhinitis   . Anemia   . Anxiety   . Chronic systolic CHF (congestive heart failure) ([place])    a. 2D Echo 02/02/15: EF 25-30%, akinesis of mid-apical anteroseptal myocardium, grade 3 DD, mod MR, severely dilated LA, mildly reduced RV systoluc function, mod dilated RA, mod TR, mildly increased PASP 83mmHg. b. 10/2016: EF 55-60% with no regional WMA. Grade 3 DD.   [name] Colitis    lymphocytic colitis feb 2011  . Coronary artery calcification    a. By CT 01/2015.  . Diverticulosis of colon   . DJD (degenerative joint disease)    gets epidural injections  . Dysphagia   . Hiatal hernia   . Hypercholesteremia   . Hypertension   . Iron deficiency   . Irritable bowel syndrome   . Liver lesion 02/18/2015  . Lumbar back pain   . Osteoporosis   . Paroxysmal atrial flutter ([place])    a. Dx 01/2015 - underwent DCCV 03/2015; takes Eliquis  . Sinus bradycardia   . Splenic lesion 02/18/2015  . Urinary incontinence   . Venous insufficiency   . Vitamin B12 deficiency     Family History  Problem Relation Age of Onset  . Cancer Brother        lung  . Diabetes Son   . Coronary artery disease Son   . Kidney disease Son   . Stroke Son   . Heart attack Mother   . Cancer Brother        colon  . Cancer Daughter        cervical, lung   . Heart attack Brother   . Diabetes Brother   . Heart attack Sister   .  Hypertension Neg Hx   . Fainting Neg Hx    Past Surgical History:  Procedure Laterality Date  . ABDOMINAL HYSTERECTOMY    . CARDIOVERSION N/A 02/03/2015   Procedure: CARDIOVERSION;  Surgeon: Sueanne Margarita, MD;  Location: [facility] ENDOSCOPY;  Service: Cardiovascular;  Laterality: N/A;  . CARDIOVERSION N/A 03/10/2015   Procedure: CARDIOVERSION;  Surgeon: Dorothy Spark, MD;  Location: [place];  Service: Cardiovascular;  Laterality: N/A;  . CARDIOVERSION N/A 01/07/2017   Procedure: CARDIOVERSION;  Surgeon: Pixie Casino, MD;  Location: [facility] ENDOSCOPY;  Service: Cardiovascular;  Laterality: N/A;  . cataract surgery    . decompressive lumbar laminectomy  06/2008   at L2-3, L3-4 and L4-5 by Dr. Ronnald Ramp  . LAPAROSCOPIC CHOLECYSTECTOMY  04/2009   Dr. Abran Cantor  . REPLACEMENT TOTAL KNEE    . TEE WITHOUT CARDIOVERSION N/A 02/03/2015   Procedure: TRANSESOPHAGEAL ECHOCARDIOGRAM (TEE);  Surgeon: Sueanne Margarita, MD;  Location: [place];  Service: Cardiovascular;  Laterality: N/A;   Social History   Social History Narrative  . Not on file     Objective: Vital Signs: BP 122/62 (BP Location: Right Arm, Patient Position: Sitting, Cuff Size: Normal)   Pulse (!) 52   Resp 13   Ht 4' 7.12" (1.4 m)   Wt 121 lb (54.9 kg)   BMI 28.00 kg/m    Physical Exam  Constitutional: She is oriented to person, place, and time. She appears well-developed and well-nourished.  HENT:  Head: Normocephalic and atraumatic.  Eyes: Conjunctivae and EOM are normal.  Neck: Normal range of motion.  Cardiovascular: Normal rate, regular rhythm, normal heart sounds and intact distal pulses.  Pulmonary/Chest: Effort normal and breath sounds normal.  Abdominal: Soft. Bowel sounds are normal.  Lymphadenopathy:    She has no cervical adenopathy.  Neurological: She is alert and oriented to person, place, and time.  Skin: Skin is warm and dry. Capillary refill takes less than 2 seconds.  Psychiatric: She has a normal mood  and affect. Her behavior is normal.  Nursing note and vitals reviewed.    Musculoskeletal Exam: C-spine limited range of motion.  She has severe thoracic kyphosis.  She has no midline spinal tenderness or SI joint tenderness.  She has limited shoulder abduction.  Mild left elbow contracture.  Good range of motion of bilateral wrists.  PIP and DIP synovial thickening consistent with osteoarthritis of bilateral hands.  She has bilateral CMC joint synovial thickening.  She has no active synovitis.  She has mild warmth of bilateral knee joints.  Knee joints and ankle joints good range of motion.  CDAI Exam: No CDAI exam completed.    Investigation: No additional findings. CBC Latest Ref Rng & Units 04/11/2017 01/07/2017 12/29/2016  WBC 4.0 - 10.5 K/uL 8.5 - 10.2  Hemoglobin 12.0 - 15.0 g/dL 12.2 11.6(L) 12.2  Hematocrit 36.0 - 46.0 % 35.6(L) 34.0(L) 37.0  Platelets 150.0 - 400.0 K/uL 194.0 - 238   CMP Latest Ref Rng & Units 04/11/2017 02/17/2017 01/07/2017  Glucose 70 - 99 mg/dL 84 115(H) 101(H)  BUN 6 - 23 mg/dL 17 12 -  Creatinine 0.40 - 1.20 mg/dL 0.99 0.85 -  Sodium 135 - 145 mEq/L 137 143 139  Potassium 3.5 - 5.1 mEq/L 4.4 3.6 4.2  Chloride 96 - 112 mEq/L 98 102 -  CO2 19 - 32 mEq/L 31 27 -  Calcium 8.4 - 10.5 mg/dL 9.1 9.1 -  Total Protein 6.0 - 8.3 g/dL 6.2 - -  Total Bilirubin 0.2 - 1.2 mg/dL 0.7 - -  Alkaline Phos 39 - 117 U/L 55 - -  AST 0 - 37 U/L 19 - -  ALT 0 - 35 U/L 17 - -     Imaging: No results found.  Speciality Comments: No specialty comments available.    Procedures:  No procedures performed Allergies: Amoxicillin-pot clavulanate and Sulfonamide derivatives     Assessment / Plan:     Visit Diagnoses: Age-related osteoporosis without current pathological fracture -she was previously on Prolia injections every 6 months but discontinued in 2017.  She is unaware of why she stopped receiving her injections.  We will reapply for Prolia.  She has been taking  vitamin D and calcium on a daily basis.  She had a DEXA scan performed on 05/03/2017 which revealed a T score of -5.2.  On 11/19/2016 patient had an x-ray performed after a fall which revealed it to anterior rib fractures.  On 01/05/2017  she had an x-ray of her left elbow which revealed a fracture after a fall.  We discussed fall prevention.  She denies any recent vertebral fractures and has no midline spinal tenderness.  She has severe thoracic kyphosis.  We discussed other treatment options including Reclast and Forteo/Tymlos but she would like to continue on Prolia subcutaneous injections every 6 months.  CBC and CMP were drawn today.  We also checked a vitamin D level.  She is aware that she is to return 10 days following the subcutaneous Prolia injection for lab work.  We will call her with her lab results that were collected today as well as once Prolia is approved to schedule the injection.  Her daughter was present during the exam and requested that she be called with the results: Stanton Kidney "Cookie" at [phone] or [phone]- Plan: CBC with Differential/Platelet, COMPLETE METABOLIC PANEL WITH GFR, VITAMIN D 25 Hydroxy (Vit-D Deficiency, Fractures)  Other kyphosis of thoracic region: She has severe thoracic kyphosis.  She has no midline spinal tenderness or back pain at this time.   Primary osteoarthritis of both hands: She has PIP and DIP synovial thickening consistent with osteoarthritis of bilateral hands.  She has CMC joint synovial thickening bilaterally.  Joint protection and muscle strengthening were discussed.  Primary osteoarthritis of both knees: She has mild warmth of bilateral knee joints.  No effusion noted.  She has good range of motion with no discomfort.  She walks with a walker when she is going long distances.  Primary osteoarthritis of both feet: She has no discomfort in her feet at this time.  She wears proper fitting shoes.   Other medical conditions are listed as follows:    History of anemia  History of gastroesophageal reflux (GERD)  History of hypercholesterolemia  History of atrial fibrillation    Orders: Orders Placed This Encounter  Procedures  . CBC with Differential/Platelet  . COMPLETE METABOLIC PANEL WITH GFR  . VITAMIN D 25 Hydroxy (Vit-D Deficiency, Fractures)   No orders of the defined types were placed in this encounter.   Face-to-face time spent with patient was 30 minutes. >50% of time was spent in counseling and coordination of care.  Follow-Up Instructions: Return in about 6 months (around 01/27/2018) for Osteoporosis, Osteoarthritis.   Ofilia Neas, PA-C  Note - This record has been created using Dragon software.  Chart creation errors have been sought, but may not always  have been located. Such creation errors do not reflect on  the standard of medical care.

## 2017-07-27 ENCOUNTER — Encounter: Payer: Self-pay | Admitting: Physician Assistant

## 2017-07-27 ENCOUNTER — Ambulatory Visit (INDEPENDENT_AMBULATORY_CARE_PROVIDER_SITE_OTHER): Payer: Medicare Other | Admitting: Physician Assistant

## 2017-07-27 VITALS — BP 122/62 | HR 52 | Resp 13 | Ht <= 58 in | Wt 121.0 lb

## 2017-07-27 DIAGNOSIS — M19071 Primary osteoarthritis, right ankle and foot: Secondary | ICD-10-CM

## 2017-07-27 DIAGNOSIS — M810 Age-related osteoporosis without current pathological fracture: Secondary | ICD-10-CM | POA: Diagnosis not present

## 2017-07-27 DIAGNOSIS — Z8679 Personal history of other diseases of the circulatory system: Secondary | ICD-10-CM

## 2017-07-27 DIAGNOSIS — M40294 Other kyphosis, thoracic region: Secondary | ICD-10-CM

## 2017-07-27 DIAGNOSIS — M170 Bilateral primary osteoarthritis of knee: Secondary | ICD-10-CM | POA: Diagnosis not present

## 2017-07-27 DIAGNOSIS — M19072 Primary osteoarthritis, left ankle and foot: Secondary | ICD-10-CM

## 2017-07-27 DIAGNOSIS — Z862 Personal history of diseases of the blood and blood-forming organs and certain disorders involving the immune mechanism: Secondary | ICD-10-CM

## 2017-07-27 DIAGNOSIS — Z8639 Personal history of other endocrine, nutritional and metabolic disease: Secondary | ICD-10-CM

## 2017-07-27 DIAGNOSIS — M19042 Primary osteoarthritis, left hand: Secondary | ICD-10-CM

## 2017-07-27 DIAGNOSIS — Z8719 Personal history of other diseases of the digestive system: Secondary | ICD-10-CM | POA: Diagnosis not present

## 2017-07-27 DIAGNOSIS — M19041 Primary osteoarthritis, right hand: Secondary | ICD-10-CM | POA: Diagnosis not present

## 2017-07-28 LAB — CBC WITH DIFFERENTIAL/PLATELET
BASOS ABS: 16 {cells}/uL (ref 0–200)
Basophils Relative: 0.2 %
EOS ABS: 72 {cells}/uL (ref 15–500)
EOS PCT: 0.9 %
HCT: 33.1 % — ABNORMAL LOW (ref 35.0–45.0)
Hemoglobin: 11.5 g/dL — ABNORMAL LOW (ref 11.7–15.5)
Lymphs Abs: 912 cells/uL (ref 850–3900)
MCH: 31 pg (ref 27.0–33.0)
MCHC: 34.7 g/dL (ref 32.0–36.0)
MCV: 89.2 fL (ref 80.0–100.0)
MPV: 12.1 fL (ref 7.5–12.5)
Monocytes Relative: 8.7 %
Neutro Abs: 6304 cells/uL (ref 1500–7800)
Neutrophils Relative %: 78.8 %
PLATELETS: 183 10*3/uL (ref 140–400)
RBC: 3.71 10*6/uL — ABNORMAL LOW (ref 3.80–5.10)
RDW: 13.4 % (ref 11.0–15.0)
TOTAL LYMPHOCYTE: 11.4 %
WBC mixed population: 696 cells/uL (ref 200–950)
WBC: 8 10*3/uL (ref 3.8–10.8)

## 2017-07-28 LAB — COMPLETE METABOLIC PANEL WITH GFR
AG Ratio: 1.9 (calc) (ref 1.0–2.5)
ALBUMIN MSPROF: 3.7 g/dL (ref 3.6–5.1)
ALKALINE PHOSPHATASE (APISO): 63 U/L (ref 33–130)
ALT: 17 U/L (ref 6–29)
AST: 21 U/L (ref 10–35)
BUN / CREAT RATIO: 15 (calc) (ref 6–22)
BUN: 14 mg/dL (ref 7–25)
CALCIUM: 9.2 mg/dL (ref 8.6–10.4)
CO2: 31 mmol/L (ref 20–32)
CREATININE: 0.94 mg/dL — AB (ref 0.60–0.88)
Chloride: 103 mmol/L (ref 98–110)
GFR, EST NON AFRICAN AMERICAN: 55 mL/min/{1.73_m2} — AB (ref >=60)
GFR, Est African American: 63 mL/min/{1.73_m2} (ref >=60)
GLOBULIN: 2 g/dL (ref 1.9–3.7)
Glucose, Bld: 72 mg/dL (ref 65–99)
Potassium: 4.7 mmol/L (ref 3.5–5.3)
SODIUM: 140 mmol/L (ref 135–146)
Total Bilirubin: 0.6 mg/dL (ref 0.2–1.2)
Total Protein: 5.7 g/dL — ABNORMAL LOW (ref 6.1–8.1)

## 2017-07-28 LAB — VITAMIN D 25 HYDROXY (VIT D DEFICIENCY, FRACTURES): Vit D, 25-Hydroxy: 45 ng/mL (ref 30–100)

## 2017-07-28 NOTE — Progress Notes (Signed)
Pt has findings consistent with anemia.  Please forward to PCP.   Creatinine is mildly elevated.  Please advise patient to avoid NSAIDs.   Calcium and Vitamin D are within desirable range.  She should continue on current maintenance doses of supplements.

## 2017-08-10 DIAGNOSIS — M19012 Primary osteoarthritis, left shoulder: Secondary | ICD-10-CM | POA: Insufficient documentation

## 2017-08-10 DIAGNOSIS — M25512 Pain in left shoulder: Secondary | ICD-10-CM | POA: Insufficient documentation

## 2017-08-27 ENCOUNTER — Other Ambulatory Visit: Payer: Self-pay | Admitting: Cardiology

## 2017-08-29 ENCOUNTER — Telehealth: Payer: Self-pay

## 2017-08-29 NOTE — Telephone Encounter (Signed)
I received an Eliquis PA request form via fax from [facility]. I have completed the form and gave it to [place], Dr Macky Lower nurse to give to Dr Shonna Chock to sign.

## 2017-08-29 NOTE — Telephone Encounter (Signed)
Eliquis 2.5mg  refill request received; pt is 84 yrs old, wt-54.9kg, Crea-0.94 on 07/27/17, last seen by [facility] on 04/18/17; will send in a refill to requested pharmacy.

## 2017-08-30 NOTE — Telephone Encounter (Signed)
Dr Curt Bears has signed the form and I have faxed it back to [facility].

## 2017-09-05 NOTE — Telephone Encounter (Addendum)
**Note De-Identified [name] Obfuscation** Letter received Cathy [name] fax from [place] stating that they have approved the pts Eliquis PA. Approval good from 07/31/17 until 08/30/2018. Case ID: [id number]  I have notified the pts pharmacy of this approval.

## 2017-09-08 ENCOUNTER — Other Ambulatory Visit: Payer: Self-pay | Admitting: Internal Medicine

## 2017-09-08 MED ORDER — PREGABALIN 50 MG PO CAPS: 50.0000 mg | ORAL_CAPSULE | Freq: Three times a day (TID) | ORAL | 1 refills | Status: DC

## 2017-09-08 NOTE — Telephone Encounter (Signed)
Done erx 

## 2017-09-08 NOTE — Telephone Encounter (Signed)
Copied from [place] [id number]. Topic: Quick Communication - Rx Refill/Question >> Sep 08, 2017  3:27 PM Pricilla Handler wrote: Reason for CRM: Patient called requesting a refill of pregabalin (LYRICA) 50 MG capsule. Patient states that she is almost completely out. Patient's preferred pharmacy is [place], [place] - [address] [phone] (Phone)  [phone] (Fax).        Thank You!!!

## 2017-09-08 NOTE — Telephone Encounter (Signed)
Lyrica refill-patient is almost out Last OV:11/14/1/ Last refill:01/12/17 [place] refill GYL:UDAP Pharmacy: [place], [place] [phone] (Phone) [phone] (Fax)

## 2017-10-10 ENCOUNTER — Encounter: Payer: Self-pay | Admitting: Internal Medicine

## 2017-10-10 ENCOUNTER — Other Ambulatory Visit: Payer: Self-pay | Admitting: Internal Medicine

## 2017-10-10 ENCOUNTER — Other Ambulatory Visit (INDEPENDENT_AMBULATORY_CARE_PROVIDER_SITE_OTHER): Payer: Medicare Other

## 2017-10-10 ENCOUNTER — Ambulatory Visit (INDEPENDENT_AMBULATORY_CARE_PROVIDER_SITE_OTHER): Payer: Medicare Other | Admitting: Internal Medicine

## 2017-10-10 VITALS — BP 118/62 | HR 76 | Temp 98.2°F | Ht <= 58 in | Wt 115.0 lb

## 2017-10-10 DIAGNOSIS — I5042 Chronic combined systolic (congestive) and diastolic (congestive) heart failure: Secondary | ICD-10-CM

## 2017-10-10 DIAGNOSIS — R269 Unspecified abnormalities of gait and mobility: Secondary | ICD-10-CM | POA: Diagnosis not present

## 2017-10-10 DIAGNOSIS — R739 Hyperglycemia, unspecified: Secondary | ICD-10-CM | POA: Diagnosis not present

## 2017-10-10 DIAGNOSIS — I10 Essential (primary) hypertension: Secondary | ICD-10-CM

## 2017-10-10 DIAGNOSIS — R5382 Chronic fatigue, unspecified: Secondary | ICD-10-CM

## 2017-10-10 LAB — BASIC METABOLIC PANEL
BUN: 18 mg/dL (ref 6–23)
CHLORIDE: 104 meq/L (ref 96–112)
CO2: 32 mEq/L (ref 19–32)
CREATININE: 1.08 mg/dL (ref 0.40–1.20)
Calcium: 9.2 mg/dL (ref 8.4–10.5)
GFR: 50.89 mL/min — ABNORMAL LOW (ref >60.00)
GLUCOSE: 119 mg/dL — AB (ref 70–99)
POTASSIUM: 3.5 meq/L (ref 3.5–5.1)
Sodium: 142 mEq/L (ref 135–145)

## 2017-10-10 LAB — HEPATIC FUNCTION PANEL
ALK PHOS: 55 U/L (ref 39–117)
ALT: 14 U/L (ref 0–35)
AST: 18 U/L (ref 0–37)
Albumin: 3.8 g/dL (ref 3.5–5.2)
BILIRUBIN DIRECT: 0.2 mg/dL (ref 0.0–0.3)
BILIRUBIN TOTAL: 0.7 mg/dL (ref 0.2–1.2)
Total Protein: 6.3 g/dL (ref 6.0–8.3)

## 2017-10-10 LAB — CBC WITH DIFFERENTIAL/PLATELET
BASOS ABS: 0 10*3/uL (ref 0.0–0.1)
BASOS PCT: 0.3 % (ref 0.0–3.0)
EOS ABS: 0 10*3/uL (ref 0.0–0.7)
Eosinophils Relative: 0.1 % (ref 0.0–5.0)
HCT: 34.6 % — ABNORMAL LOW (ref 36.0–46.0)
HEMOGLOBIN: 11.8 g/dL — AB (ref 12.0–15.0)
LYMPHS PCT: 4.8 % — AB (ref 12.0–46.0)
Lymphs Abs: 0.5 10*3/uL — ABNORMAL LOW (ref 0.7–4.0)
MCHC: 34 g/dL (ref 30.0–36.0)
MCV: 91.4 fl (ref 78.0–100.0)
MONO ABS: 1.3 10*3/uL — AB (ref 0.1–1.0)
Monocytes Relative: 11.5 % (ref 3.0–12.0)
Neutro Abs: 9.2 10*3/uL — ABNORMAL HIGH (ref 1.4–7.7)
Neutrophils Relative %: 83.3 % — ABNORMAL HIGH (ref 43.0–77.0)
PLATELETS: 203 10*3/uL (ref 150.0–400.0)
RBC: 3.79 Mil/uL — ABNORMAL LOW (ref 3.87–5.11)
RDW: 14.9 % (ref 11.5–15.5)
WBC: 11.1 10*3/uL — ABNORMAL HIGH (ref 4.0–10.5)

## 2017-10-10 LAB — HEMOGLOBIN A1C: HEMOGLOBIN A1C: 5.5 % (ref 4.6–6.5)

## 2017-10-10 LAB — TSH: TSH: 34.35 u[IU]/mL — AB (ref 0.35–4.50)

## 2017-10-10 MED ORDER — LEVOTHYROXINE SODIUM 50 MCG PO TABS: 50.0000 ug | ORAL_TABLET | Freq: Every day | ORAL | 3 refills | Status: DC

## 2017-10-10 NOTE — Assessment & Plan Note (Signed)
Etiology unclear, Exam otherwise benign, to check labs as documented, follow with expectant management  

## 2017-10-10 NOTE — Assessment & Plan Note (Signed)
stable overall by history and exam, recent data reviewed with pt, and pt to continue medical treatment as before,  to f/u any worsening symptoms or concerns BP Readings from Last 3 Encounters:  10/10/17 118/62  07/27/17 122/62  04/18/17 (!) 142/64

## 2017-10-10 NOTE — Assessment & Plan Note (Signed)
stable overall by history and exam, recent data reviewed with pt, and pt to continue medical treatment as before,  to f/u any worsening symptoms or concerns Lab Results  Component Value Date   HGBA1C 5.5 10/10/2017

## 2017-10-10 NOTE — Assessment & Plan Note (Signed)
stable overall by history and exam, recent data reviewed with pt, and pt to continue medical treatment as before,  to f/u any worsening symptoms or concerns  

## 2017-10-10 NOTE — Progress Notes (Signed)
Subjective:    Patient ID: Cathy [name], female    DOB: 1927/09/16, 84 y.o.   MRN: [id number]  HPI  Here to f/u; overall doing ok,  Pt denies chest pain, increasing sob or doe, wheezing, orthopnea, PND, increased LE swelling, palpitations, dizziness or syncope.  Pt denies new neurological symptoms such as new headache, or facial or extremity weakness or numbness.  Pt denies polydipsia, polyuria, or low sugar episode.  Pt states overall good compliance with meds, mostly trying to follow appropriate diet, with wt overall stable,  but little exercise however, due to worsening gait and general weakness with off balance and increased risk of fall. Has f/u with card sept 2019.  Does c/o ongoing fatigue, but denies signficant daytime hypersomnolence.  Pt denies fever, wt loss, night sweats, loss of appetite, or other constitutional symptoms Past Medical History:  Diagnosis Date  . Allergic rhinitis   . Anemia   . Anxiety   . Chronic systolic CHF (congestive heart failure) ([place])    a. 2D Echo 02/02/15: EF 25-30%, akinesis of mid-apical anteroseptal myocardium, grade 3 DD, mod MR, severely dilated LA, mildly reduced RV systoluc function, mod dilated RA, mod TR, mildly increased PASP 45mmHg. b. 10/2016: EF 55-60% with no regional WMA. Grade 3 DD.   [name] Colitis    lymphocytic colitis feb 2011  . Coronary artery calcification    a. By CT 01/2015.  . Diverticulosis of colon   . DJD (degenerative joint disease)    gets epidural injections  . Dysphagia   . Hiatal hernia   . Hypercholesteremia   . Hypertension   . Iron deficiency   . Irritable bowel syndrome   . Liver lesion 02/18/2015  . Lumbar back pain   . Osteoporosis   . Paroxysmal atrial flutter ([place])    a. Dx 01/2015 - underwent DCCV 03/2015; takes Eliquis  . Sinus bradycardia   . Splenic lesion 02/18/2015  . Urinary incontinence   . Venous insufficiency   . Vitamin B12 deficiency    Past Surgical History:  Procedure Laterality Date  .  ABDOMINAL HYSTERECTOMY    . CARDIOVERSION N/A 02/03/2015   Procedure: CARDIOVERSION;  Surgeon: Sueanne Margarita, MD;  Location: [facility] ENDOSCOPY;  Service: Cardiovascular;  Laterality: N/A;  . CARDIOVERSION N/A 03/10/2015   Procedure: CARDIOVERSION;  Surgeon: Dorothy Spark, MD;  Location: [place];  Service: Cardiovascular;  Laterality: N/A;  . CARDIOVERSION N/A 01/07/2017   Procedure: CARDIOVERSION;  Surgeon: Pixie Casino, MD;  Location: [facility] ENDOSCOPY;  Service: Cardiovascular;  Laterality: N/A;  . cataract surgery    . decompressive lumbar laminectomy  06/2008   at L2-3, L3-4 and L4-5 by Dr. Ronnald Ramp  . LAPAROSCOPIC CHOLECYSTECTOMY  04/2009   Dr. Abran Cantor  . REPLACEMENT TOTAL KNEE    . TEE WITHOUT CARDIOVERSION N/A 02/03/2015   Procedure: TRANSESOPHAGEAL ECHOCARDIOGRAM (TEE);  Surgeon: Sueanne Margarita, MD;  Location: [facility] ENDOSCOPY;  Service: Cardiovascular;  Laterality: N/A;    reports that she has quit smoking. Her smoking use included cigarettes. She has a 5.00 pack-year smoking history. She has never used smokeless tobacco. She reports that she does not drink alcohol or use drugs. family history includes Cancer in her brother, brother, and daughter; Coronary artery disease in her son; Diabetes in her brother and son; Heart attack in her brother, mother, and sister; Kidney disease in her son; Stroke in her son. Allergies  Allergen Reactions  . Amoxicillin-Pot Clavulanate Diarrhea    Has patient had  a PCN reaction causing immediate rash, facial/tongue/throat swelling, SOB or lightheadedness with hypotension: no Has patient had a PCN reaction causing severe rash involving mucus membranes or skin necrosis:  no Has patient had a PCN reaction that required hospitalization: no Has patient had a PCN reaction occurring within the last 10 years no If all of the above answers are "NO", then may proceed with Cephalosporin use.   . Sulfonamide Derivatives Other (See Comments)    unknown   Current  Outpatient Medications on File Prior to Visit  Medication Sig Dispense Refill  . amiodarone (PACERONE) 200 MG tablet Take 1 tablet (200 mg total) by mouth daily. 90 tablet 2  . aspirin 81 MG tablet Take 81 mg by mouth daily.    [name] atorvastatin (LIPITOR) 20 MG tablet TAKE 1 TABLET DAILY AT 6 P.M. 90 tablet 2  . calcium-vitamin D (OSCAL WITH D) 500-200 MG-UNIT per tablet Take 1 tablet by mouth daily.     . cholecalciferol (VITAMIN D) 1000 UNITS tablet Take 1,000 Units by mouth daily.    . diclofenac (FLECTOR) 1.3 % PTCH Place 1 patch onto the skin daily.    [name] ELIQUIS 2.5 MG TABS tablet TAKE 1 TABLET TWICE A DAY 180 tablet 1  . esomeprazole (NEXIUM) 40 MG capsule TAKE 1 CAPSULE DAILY 90 capsule 3  . fexofenadine (ALLEGRA) 180 MG tablet Take 180 mg by mouth daily as needed for allergies or rhinitis.    [name] isosorbide mononitrate (IMDUR) 30 MG 24 hr tablet Take 1 tablet (30 mg total) by mouth daily. 90 tablet 3  . polyethylene glycol (MIRALAX / GLYCOLAX) packet Take 17 g by mouth 2 (two) times daily. (Patient taking differently: Take 17 g by mouth daily. ) 28 each 0  . pregabalin (LYRICA) 50 MG capsule Take 1 capsule (50 mg total) by mouth 3 (three) times daily. 270 capsule 1  . senna-docusate (SENOKOT-S) 8.6-50 MG tablet Take 1 tablet by mouth at bedtime. (Patient taking differently: Take 1 tablet by mouth as needed. ) 30 tablet 0  . solifenacin (VESICARE) 10 MG tablet Take 1 tablet (10 mg total) by mouth daily. 90 tablet 1  . vitamin B-12 (CYANOCOBALAMIN) 1000 MCG tablet Take 1 tablet (1,000 mcg total) by mouth daily.    . furosemide (LASIX) 20 MG tablet Take 1 tablet (20 mg total) by mouth every other day. 30 tablet 6   No current facility-administered medications on file prior to visit.    Review of Systems  Constitutional: Negative for other unusual diaphoresis or sweats HENT: Negative for ear discharge or swelling Eyes: Negative for other worsening visual disturbances Respiratory: Negative for  stridor or other swelling  Gastrointestinal: Negative for worsening distension or other blood Genitourinary: Negative for retention or other urinary change Musculoskeletal: Negative for other MSK pain or swelling Skin: Negative for color change or other new lesions Neurological: Negative for worsening tremors and other numbness  Psychiatric/Behavioral: Negative for worsening agitation or other fatigue All other system neg per pt    Objective:   Physical Exam BP 118/62   Pulse 76   Temp 98.2 F (36.8 C) (Oral)   Ht 4' 7.12" (1.4 m)   Wt 115 lb (52.2 kg)   SpO2 95%   BMI 26.61 kg/m  VS noted,  Constitutional: Pt appears in NAD HENT: Head: NCAT.  Right Ear: External ear normal.  Left Ear: External ear normal.  Eyes: . Pupils are equal, round, and reactive to light. Conjunctivae and EOM are normal  Nose: without d/c or deformity Neck: Neck supple. Gross normal ROM Cardiovascular: Normal rate and regular rhythm.   Pulmonary/Chest: Effort normal and breath sounds without rales or wheezing.  Abd:  Soft, NT, ND, + BS, no organomegaly Neurological: Pt is alert. At baseline orientation, motor grossly intact, unsteady mild with cane Skin: Skin is warm. No rashes, other new lesions, no LE edema Psychiatric: Pt behavior is normal without agitation  No other exam findings     Assessment & Plan:

## 2017-10-10 NOTE — Patient Instructions (Signed)
Please continue all other medications as before, and refills have been done if requested.  Please have the pharmacy call with any other refills you may need.  Please continue your efforts at being more active, low cholesterol diet, and weight control  Please keep your appointments with your specialists as you may have planned  You will be contacted regarding the referral for: [place] with RN (nurse) and Physical Therapy per [facility]  Please go to the LAB in the Basement (turn left off the elevator) for the tests to be done today  You will be contacted by phone if any changes need to be made immediately.  Otherwise, you will receive a letter about your results with an explanation, but please check with MyChart first.  Please remember to sign up for MyChart if you have not done so, as this will be important to you in the future with finding out test results, communicating by private email, and scheduling acute appointments online when needed.  Please return in 6 months, or sooner if needed

## 2017-10-10 NOTE — Assessment & Plan Note (Signed)
Mild to mod, for [facility] with RN and PT,  to f/u any worsening symptoms or concerns

## 2017-10-11 ENCOUNTER — Telehealth: Payer: Self-pay

## 2017-10-11 NOTE — Telephone Encounter (Signed)
Pt has been informed and expressed understanding.  

## 2017-10-11 NOTE — Telephone Encounter (Signed)
-----   Message from Biagio Borg, MD sent at 10/10/2017  7:46 PM EDT ----- Left message on MyChart, pt to cont same tx except  The test results show that your current treatment is OK, except the thyroid testing shows the thyroid to be moderately low.  We need to begin a low dose thyroid medication, and plan to recheck at a future visit.  I will send the prescription, and you should be contacted by the office as well    Cathy [name] to please inform pt, I will do rx

## 2017-10-12 ENCOUNTER — Other Ambulatory Visit: Payer: Self-pay | Admitting: Cardiology

## 2017-10-21 ENCOUNTER — Telehealth: Payer: Self-pay | Admitting: Internal Medicine

## 2017-10-21 NOTE — Telephone Encounter (Signed)
Copied from [place] [id number]. Topic: General - Other >> Oct 21, 2017  2:05 PM Berneta Levins wrote: Reason for CRM:  Darlina Guys from [facility] calling to report they are getting pt started on home health on 10/24/2017.

## 2017-10-25 ENCOUNTER — Telehealth: Payer: Self-pay | Admitting: Internal Medicine

## 2017-10-25 NOTE — Telephone Encounter (Signed)
Copied from [place]. Topic: Quick Communication - See Telephone Encounter >> Oct 25, 2017  4:35 PM Vernona Rieger wrote: CRM for notification. See Telephone encounter for: 10/25/17.  Zelphia Cairo , RN with [facility] called for orders to see the patient for nursing. She would like to see her for 2 week 2 and 1 week 4. Call back @ [phone]

## 2017-10-26 ENCOUNTER — Telehealth: Payer: Self-pay | Admitting: Internal Medicine

## 2017-10-26 NOTE — Telephone Encounter (Signed)
Copied from [place] [id number]. Topic: Quick Communication - See Telephone Encounter >> Oct 26, 2017  5:01 PM Ivar Drape wrote: CRM for notification. See Telephone encounter for: 10/26/17. Flor PT w/[facility] [phone] wanted the provider to know the BP was 120/60, heart rate was 50.  Patient didn't feel anything unusual. She also needs an order for PT 2w5.

## 2017-10-26 NOTE — Telephone Encounter (Signed)
Ok for verbals 

## 2017-10-26 NOTE — Telephone Encounter (Signed)
Called Cathy [name] no answer LMOM w/MD response.Marland KitchenJohny [name]

## 2017-10-27 NOTE — Telephone Encounter (Signed)
Ok for verbals 

## 2017-10-27 NOTE — Telephone Encounter (Signed)
Verbal orders given  

## 2017-11-01 ENCOUNTER — Encounter: Payer: Self-pay | Admitting: Cardiology

## 2017-11-01 ENCOUNTER — Ambulatory Visit (INDEPENDENT_AMBULATORY_CARE_PROVIDER_SITE_OTHER): Payer: Medicare Other | Admitting: Cardiology

## 2017-11-01 VITALS — BP 124/62 | HR 54 | Ht <= 58 in | Wt 116.0 lb

## 2017-11-01 DIAGNOSIS — I428 Other cardiomyopathies: Secondary | ICD-10-CM

## 2017-11-01 DIAGNOSIS — I10 Essential (primary) hypertension: Secondary | ICD-10-CM | POA: Diagnosis not present

## 2017-11-01 DIAGNOSIS — I483 Typical atrial flutter: Secondary | ICD-10-CM

## 2017-11-01 NOTE — Patient Instructions (Signed)
Medication Instructions:  Your physician recommends that you continue on your current medications as directed. Please refer to the Current Medication list given to you today.  If you need a refill on your cardiac medications before your next appointment, please call your pharmacy.   Labwork: None ordered  Testing/Procedures: None ordered  Follow-Up: Your physician wants you to follow-up in: 6 months with Dr. Camnitz.  You will receive a reminder letter in the mail two months in advance. If you don't receive a letter, please call our office to schedule the follow-up appointment.  Thank you for choosing [facility]!!   Dareth Andrew, RN [phone]         

## 2017-11-01 NOTE — Progress Notes (Signed)
Electrophysiology Office Note   Date:  11/01/2017   ID:  Cathy [name], DOB [birth year], MRN [id number]  PCP:  Biagio Borg, MD  Cardiologist:  Pernell Dupre Primary Electrophysiologist:  Constance Haw, MD    No chief complaint on file.    History of Present Illness: Cathy [name] is a 84 y.o. female who presents today for electrophysiology evaluation.   She has a history of HTN, venous insufficiency, hypercholesterolemia, anemia, and recently diagnosed atrial flutter, combined CHF/LV dysfunction (EF 25-30%), and coronary artery calcification.  She was admitted to the hospital 01/2015 after being diagnosed with atrial flutter and 2-1 conduction.  CTA showed no PE.  TEE cardioversion was planned but was not able to adequately visualize the left atrial appendage.  She was started on amiodarone and Eliquis.  Echo 02/02/15 showed an EF of 25-30%.  Patient was seen back and cardioversion was arranged.  She was on amiodarone at the time.  Unfortunately, she fell and broke right-sided ribs and this cardioversion was not performed.  She returns to clinic today to discuss further plans for rhythm control  Today, denies symptoms of palpitations, chest pain, shortness of breath, orthopnea, PND, lower extremity edema, claudication, dizziness, presyncope, syncope, bleeding, or neurologic sequela. The patient is tolerating medications without difficulties.  He is overall feeling well.  She has no major complaints.  Her only complaint is that her hands twitch at times.  She has noted no palpitations.  Past Medical History:  Diagnosis Date  . Allergic rhinitis   . Anemia   . Anxiety   . Chronic systolic CHF (congestive heart failure) ([place])    a. 2D Echo 02/02/15: EF 25-30%, akinesis of mid-apical anteroseptal myocardium, grade 3 DD, mod MR, severely dilated LA, mildly reduced RV systoluc function, mod dilated RA, mod TR, mildly increased PASP 61mmHg. b. 10/2016: EF 55-60% with no regional WMA. Grade 3  DD.   [name] Colitis    lymphocytic colitis feb 2011  . Coronary artery calcification    a. By CT 01/2015.  . Diverticulosis of colon   . DJD (degenerative joint disease)    gets epidural injections  . Dysphagia   . Hiatal hernia   . Hypercholesteremia   . Hypertension   . Iron deficiency   . Irritable bowel syndrome   . Liver lesion 02/18/2015  . Lumbar back pain   . Osteoporosis   . Paroxysmal atrial flutter ([place])    a. Dx 01/2015 - underwent DCCV 03/2015; takes Eliquis  . Sinus bradycardia   . Splenic lesion 02/18/2015  . Urinary incontinence   . Venous insufficiency   . Vitamin B12 deficiency    Past Surgical History:  Procedure Laterality Date  . ABDOMINAL HYSTERECTOMY    . CARDIOVERSION N/A 02/03/2015   Procedure: CARDIOVERSION;  Surgeon: Sueanne Margarita, MD;  Location: [facility] ENDOSCOPY;  Service: Cardiovascular;  Laterality: N/A;  . CARDIOVERSION N/A 03/10/2015   Procedure: CARDIOVERSION;  Surgeon: Dorothy Spark, MD;  Location: [place];  Service: Cardiovascular;  Laterality: N/A;  . CARDIOVERSION N/A 01/07/2017   Procedure: CARDIOVERSION;  Surgeon: Pixie Casino, MD;  Location: [facility] ENDOSCOPY;  Service: Cardiovascular;  Laterality: N/A;  . cataract surgery    . decompressive lumbar laminectomy  06/2008   at L2-3, L3-4 and L4-5 by Dr. Ronnald Ramp  . LAPAROSCOPIC CHOLECYSTECTOMY  04/2009   Dr. Abran Cantor  . REPLACEMENT TOTAL KNEE    . TEE WITHOUT CARDIOVERSION N/A 02/03/2015   Procedure: TRANSESOPHAGEAL ECHOCARDIOGRAM (TEE);  Surgeon: Sueanne Margarita, MD;  Location: [facility] ENDOSCOPY;  Service: Cardiovascular;  Laterality: N/A;     Current Outpatient Medications  Medication Sig Dispense Refill  . amiodarone (PACERONE) 200 MG tablet Take 1 tablet (200 mg total) by mouth daily. 90 tablet 2  . aspirin 81 MG tablet Take 81 mg by mouth daily.    [name] atorvastatin (LIPITOR) 20 MG tablet TAKE 1 TABLET DAILY AT 6 P.M. 90 tablet 2  . calcium-vitamin D (OSCAL WITH D) 500-200 MG-UNIT per tablet  Take 1 tablet by mouth daily.     . cholecalciferol (VITAMIN D) 1000 UNITS tablet Take 1,000 Units by mouth daily.    . diclofenac (FLECTOR) 1.3 % PTCH Place 1 patch onto the skin daily.    [name] ELIQUIS 2.5 MG TABS tablet TAKE 1 TABLET TWICE A DAY 180 tablet 1  . esomeprazole (NEXIUM) 40 MG capsule TAKE 1 CAPSULE DAILY 90 capsule 3  . fexofenadine (ALLEGRA) 180 MG tablet Take 180 mg by mouth daily as needed for allergies or rhinitis.    [name] isosorbide mononitrate (IMDUR) 30 MG 24 hr tablet TAKE 1 TABLET(30 MG) BY MOUTH DAILY 90 tablet 0  . levothyroxine (SYNTHROID, LEVOTHROID) 50 MCG tablet Take 1 tablet (50 mcg total) by mouth daily. 90 tablet 3  . polyethylene glycol (MIRALAX / GLYCOLAX) packet Take 17 g by mouth 2 (two) times daily. (Patient taking differently: Take 17 g by mouth daily. ) 28 each 0  . pregabalin (LYRICA) 50 MG capsule Take 1 capsule (50 mg total) by mouth 3 (three) times daily. 270 capsule 1  . senna-docusate (SENOKOT-S) 8.6-50 MG tablet Take 1 tablet by mouth at bedtime. (Patient taking differently: Take 1 tablet by mouth as needed. ) 30 tablet 0  . solifenacin (VESICARE) 10 MG tablet Take 1 tablet (10 mg total) by mouth daily. 90 tablet 1  . vitamin B-12 (CYANOCOBALAMIN) 1000 MCG tablet Take 1 tablet (1,000 mcg total) by mouth daily.    . furosemide (LASIX) 20 MG tablet Take 1 tablet (20 mg total) by mouth every other day. 30 tablet 6   No current facility-administered medications for this visit.     Allergies:   Amoxicillin-pot clavulanate and Sulfonamide derivatives   Social History:  The patient  reports that she has quit smoking. Her smoking use included cigarettes. She has a 5.00 pack-year smoking history. She has never used smokeless tobacco. She reports that she does not drink alcohol or use drugs.   Family History:  The patient's family history includes Cancer in her brother, brother, and daughter; Coronary artery disease in her son; Diabetes in her brother and son;  Heart attack in her brother, mother, and sister; Kidney disease in her son; Stroke in her son.   ROS:  Please see the history of present illness.   Otherwise, review of systems is positive for leg swelling, dyspnea on exertion.   All other systems are reviewed and negative.   PHYSICAL EXAM: VS:  BP 124/62   Pulse (!) 54   Ht 4' 7.14" (1.401 m)   Wt 116 lb (52.6 kg)   BMI 26.82 kg/m  , BMI Body mass index is 26.82 kg/m. GEN: Well nourished, well developed, in no acute distress  HEENT: normal  Neck: no JVD, carotid bruits, or masses Cardiac: RRR; no murmurs, rubs, or gallops,no edema  Respiratory:  clear to auscultation bilaterally, normal work of breathing GI: soft, nontender, nondistended, + BS MS: no deformity or atrophy  Skin: warm and  dry Neuro:  Strength and sensation are intact Psych: euthymic mood, full affect  EKG:  EKG is ordered today. Personal review of the ekg ordered shows sinus rhythm, right bundle branch block, rate 54  Recent Labs: 11/27/2016: B Natriuretic Peptide 348.3 12/03/2016: Magnesium 1.9 10/10/2017: ALT 14; BUN 18; Creatinine, Ser 1.08; Hemoglobin 11.8; Platelets 203.0; Potassium 3.5; Sodium 142; TSH 34.35    Lipid Panel     Component Value Date/Time   CHOL 128 04/11/2017 1536   TRIG 54.0 04/11/2017 1536   HDL 63.30 04/11/2017 1536   CHOLHDL 2 04/11/2017 1536   VLDL 10.8 04/11/2017 1536   LDLCALC 54 04/11/2017 1536   LDLDIRECT 158.1 05/29/2008 1137     Wt Readings from Last 3 Encounters:  11/01/17 116 lb (52.6 kg)  10/10/17 115 lb (52.2 kg)  07/27/17 121 lb (54.9 kg)      Other studies Reviewed: Additional studies/ records that were reviewed today include: TTE 02/02/15 Review of the above records today demonstrates:  - Left ventricle: The cavity size was normal. There was moderate concentric hypertrophy. Systolic function was severely reduced. The estimated ejection fraction was in the range of 25% to 30%. There is akinesis of the  mid-apicalanteroseptal myocardium. Doppler parameters are consistent with a reversible restrictive pattern, indicative of decreased left ventricular diastolic compliance and/or increased left atrial pressure (grade 3 diastolic dysfunction). - Mitral valve: Moderately calcified annulus. There was moderate regurgitation. - Left atrium: The atrium was severely dilated. Volume/bsa, ES (1-plane Simpson&'s, A4C): 64.3 ml/m^2. - Right ventricle: Systolic function was mildly reduced. - Right atrium: The atrium was moderately dilated. - Tricuspid valve: There was moderate regurgitation. - Pulmonary arteries: Systolic pressure was mildly increased. PA peak pressure: 33 mm Hg (S).   ASSESSMENT AND PLAN:  1. Atrial flutter: Currently on Eliquis and amiodarone.  In sinus rhythm today.  No changes.  This patients CHA2DS2-VASc Score and unadjusted Ischemic Stroke Rate (% per year) is equal to 4.8 % stroke rate/year from a score of 4  Above score calculated as 1 point each if present [CHF, HTN, DM, Vascular=MI/PAD/Aortic Plaque, Age if 65-74, or Female] Above score calculated as 2 points each if present [Age > 75, or Stroke/TIA/TE]   2. Chronic combined CHF -ejection fraction has since normalized.  No signs of volume overload. 3. Essential HTN -well-controlled.  No changes. 4. Hypothyroidism: Followed by primary physician.  Recently put on Synthroid.  Current medicines are reviewed at length with the patient today.   The patient does not have concerns regarding her medicines.  The following changes were made today: None  Labs/ tests ordered today include:  Orders Placed This Encounter  Procedures  . EKG 12-Lead     Disposition:   FU with Kailoni Vahle 6 months  Signed, Abdelrahman Nair Meredith Leeds, MD  11/01/2017 3:07 PM     [place] [address] [place] [address] [phone] (office) [phone] (fax)

## 2017-11-24 DIAGNOSIS — I480 Paroxysmal atrial fibrillation: Secondary | ICD-10-CM

## 2017-11-24 DIAGNOSIS — I110 Hypertensive heart disease with heart failure: Secondary | ICD-10-CM | POA: Diagnosis not present

## 2017-11-24 DIAGNOSIS — I872 Venous insufficiency (chronic) (peripheral): Secondary | ICD-10-CM

## 2017-11-24 DIAGNOSIS — D509 Iron deficiency anemia, unspecified: Secondary | ICD-10-CM | POA: Diagnosis not present

## 2017-11-24 DIAGNOSIS — K52832 Lymphocytic colitis: Secondary | ICD-10-CM

## 2017-11-24 DIAGNOSIS — E538 Deficiency of other specified B group vitamins: Secondary | ICD-10-CM | POA: Diagnosis not present

## 2017-11-24 DIAGNOSIS — I5022 Chronic systolic (congestive) heart failure: Secondary | ICD-10-CM | POA: Diagnosis not present

## 2017-11-24 DIAGNOSIS — K5730 Diverticulosis of large intestine without perforation or abscess without bleeding: Secondary | ICD-10-CM

## 2017-11-24 DIAGNOSIS — M1991 Primary osteoarthritis, unspecified site: Secondary | ICD-10-CM

## 2017-11-24 DIAGNOSIS — Z7901 Long term (current) use of anticoagulants: Secondary | ICD-10-CM

## 2017-11-24 DIAGNOSIS — M810 Age-related osteoporosis without current pathological fracture: Secondary | ICD-10-CM

## 2017-11-24 DIAGNOSIS — Z87891 Personal history of nicotine dependence: Secondary | ICD-10-CM

## 2017-11-24 DIAGNOSIS — I2510 Atherosclerotic heart disease of native coronary artery without angina pectoris: Secondary | ICD-10-CM

## 2017-12-05 ENCOUNTER — Telehealth: Payer: Self-pay

## 2017-12-05 NOTE — Telephone Encounter (Signed)
Verbal orders given.   Copied from [place] [id number]. Topic: Inquiry >> Dec 05, 2017 11:12 AM Oliver Pila B wrote: Reason for CRM: [facility] called to get home skilled nursing orders; 1wk/3wks; contact [phone]

## 2018-01-07 ENCOUNTER — Other Ambulatory Visit: Payer: Self-pay | Admitting: Cardiology

## 2018-01-08 ENCOUNTER — Other Ambulatory Visit: Payer: Self-pay | Admitting: Internal Medicine

## 2018-01-12 ENCOUNTER — Other Ambulatory Visit: Payer: Self-pay | Admitting: Cardiology

## 2018-01-16 ENCOUNTER — Encounter: Payer: Self-pay | Admitting: Internal Medicine

## 2018-01-16 NOTE — Progress Notes (Signed)
Office Visit Note  Patient: Cathy [name]             Date of Birth: 1927/06/21           MRN: [id number]             PCP: Biagio Borg, MD Referring: Biagio Borg, MD Visit Date: 01/30/2018 Occupation: @GUAROCC @  Subjective:  Discuss medication   History of Present Illness: Cathy [name] is a 84 y.o. female with history of osteoporosis and osteoarthritis. She is taking Vitamin D 1,000 units by mouth daily. She has not heard back whether her insurance will cover Prolia sq injections every 6 months.  She denies any recent falls or fractures.  She walks with a walker for a support when she is walking prolonged distances.  She denies any joint pain or joint swelling at this time.  She has occasional lower back pain.  She reports she has bilateral edema at times.     Activities of Daily Living:  Patient reports morning stiffness   all day.   Patient Denies nocturnal pain.  Difficulty dressing/grooming: Denies Difficulty climbing stairs: Denies Difficulty getting out of chair: Reports Difficulty using hands for taps, buttons, cutlery, and/or writing: Denies  Review of Systems  Constitutional: Negative for fatigue.  HENT: Positive for mouth dryness. Negative for mouth sores and nose dryness.   Eyes: Positive for dryness. Negative for pain and visual disturbance.  Respiratory: Negative for cough, hemoptysis, shortness of breath and difficulty breathing.   Cardiovascular: Positive for swelling in legs/feet. Negative for chest pain, palpitations and hypertension.  Gastrointestinal: Negative for blood in stool, constipation and diarrhea.  Endocrine: Negative for increased urination.  Genitourinary: Negative for painful urination.  Musculoskeletal: Positive for morning stiffness. Negative for arthralgias, joint pain, joint swelling, myalgias, muscle weakness, muscle tenderness and myalgias.  Skin: Negative for color change, pallor, rash, hair loss, nodules/bumps, skin tightness, ulcers  and sensitivity to sunlight.  Allergic/Immunologic: Negative for susceptible to infections.  Neurological: Negative for dizziness, numbness, headaches and weakness.  Hematological: Negative for swollen glands.  Psychiatric/Behavioral: Negative for depressed mood and sleep disturbance. The patient is not nervous/anxious.     PMFS History:  Patient Active Problem List   Diagnosis Date Noted  . Osteoarthritis of left shoulder 08/10/2017  . Pain in joint of left shoulder 08/10/2017  . Mass of joint of left wrist 06/22/2017  . Arthritis of left wrist 06/21/2017  . Carpal tunnel syndrome 06/21/2017  . Tenosynovitis of left wrist 06/21/2017  . Fracture of distal end of humerus 03/25/2017  . Closed supracondylar fracture of left humerus 01/15/2017  . Gait disorder 01/15/2017  . Recurrent falls 01/15/2017  . Rib fractures 11/28/2016  . CHF exacerbation ([place]) 11/28/2016  . Diarrhea 11/27/2016  . Chest pain 11/18/2016  . Skin lesion 09/30/2016  . Ganglion cyst of dorsum of left wrist 09/30/2016  . Itching 09/30/2016  . Acute upper respiratory infection 09/30/2016  . Rotator cuff arthropathy, right 04/26/2016  . Hyperglycemia 04/01/2016  . Right arm pain 04/01/2016  . Wrist swelling 09/30/2015  . Acute URI 06/04/2015  . Preventative health care 04/02/2015  . UTI (urinary tract infection) 04/02/2015  . Chronic combined systolic and diastolic CHF (congestive heart failure) ([place]) 03/17/2015  . Coronary artery calcification seen on CT scan 03/17/2015  . Sinus bradycardia 03/17/2015  . Splenic lesion 02/18/2015  . Liver lesion 02/18/2015  . CAD (coronary artery disease), native coronary artery 02/03/2015  . Atrial flutter ([place]) 02/02/2015  .  Fatigue 01/31/2015  . Abnormal CT of the abdomen 01/31/2015  . Prolonged Q-T interval on ECG 01/31/2015  . Hepatic lesion   . Constipation 12/03/2014  . Lower extremity edema 04/25/2014  . Peripheral neuropathy ([place]) 12/10/2013  . Overactive  bladder 12/27/2011  . Osteoporosis 12/27/2011  . VITAMIN B12 DEFICIENCY 05/03/2010  . IRON DEFICIENCY 05/03/2010  . Anemia 05/03/2010  . CLOSTRIDIUM DIFFICILE COLITIS 05/26/2009  . VENOUS INSUFFICIENCY 01/27/2009  . BACK PAIN, LUMBAR 01/27/2008  . URINARY INCONTINENCE 08/23/2007  . DIVERTICULOSIS OF COLON 04/10/2007  . HYPERCHOLESTEROLEMIA 02/13/2007  . ANXIETY 02/13/2007  . Essential hypertension 02/13/2007  . HIATAL HERNIA 02/13/2007  . Irritable bowel syndrome 02/13/2007  . DEGENERATIVE JOINT DISEASE 02/13/2007  . Allergic rhinitis 02/13/2007    Past Medical History:  Diagnosis Date  . Allergic rhinitis   . Anemia   . Anxiety   . Chronic systolic CHF (congestive heart failure) ([place])    a. 2D Echo 02/02/15: EF 25-30%, akinesis of mid-apical anteroseptal myocardium, grade 3 DD, mod MR, severely dilated LA, mildly reduced RV systoluc function, mod dilated RA, mod TR, mildly increased PASP 54mmHg. b. 10/2016: EF 55-60% with no regional WMA. Grade 3 DD.   [name] Colitis    lymphocytic colitis feb 2011  . Coronary artery calcification    a. By CT 01/2015.  . Diverticulosis of colon   . DJD (degenerative joint disease)    gets epidural injections  . Dysphagia   . Hiatal hernia   . Hypercholesteremia   . Hypertension   . Iron deficiency   . Irritable bowel syndrome   . Liver lesion 02/18/2015  . Lumbar back pain   . Osteoporosis   . Paroxysmal atrial flutter ([place])    a. Dx 01/2015 - underwent DCCV 03/2015; takes Eliquis  . Sinus bradycardia   . Splenic lesion 02/18/2015  . Urinary incontinence   . Venous insufficiency   . Vitamin B12 deficiency     Family History  Problem Relation Age of Onset  . Cancer Brother        lung  . Diabetes Son   . Coronary artery disease Son   . Kidney disease Son   . Stroke Son   . Heart attack Mother   . Cancer Brother        colon  . Cancer Daughter        cervical, lung   . Heart attack Brother   . Diabetes Brother   . Heart attack  Sister   . Hypertension Neg Hx   . Fainting Neg Hx    Past Surgical History:  Procedure Laterality Date  . ABDOMINAL HYSTERECTOMY    . CARDIOVERSION N/A 02/03/2015   Procedure: CARDIOVERSION;  Surgeon: Sueanne Margarita, MD;  Location[facility] ENDOSCOPY;  Service: Cardiovascular;  Laterality: N/A;  . CARDIOVERSION N/A 03/10/2015   Procedure: CARDIOVERSION;  Surgeon: Dorothy Spark, MD;  Location: [facility];  Service: Cardiovascular;  Laterality: N/A;  . CARDIOVERSION N/A 01/07/2017   Procedure: CARDIOVERSION;  Surgeon: Pixie Casino, MD;  Location: [facility] ENDOSCOPY;  Service: Cardiovascular;  Laterality: N/A;  . cataract surgery    . decompressive lumbar laminectomy  06/2008   at L2-3, L3-4 and L4-5 by Dr. Ronnald Ramp  . LAPAROSCOPIC CHOLECYSTECTOMY  04/2009   Dr. Abran Cantor  . REPLACEMENT TOTAL KNEE    . TEE WITHOUT CARDIOVERSION N/A 02/03/2015   Procedure: TRANSESOPHAGEAL ECHOCARDIOGRAM (TEE);  Surgeon: Sueanne Margarita, MD;  Location: [facility];  Service: Cardiovascular;  Laterality: N/A;  Social History   Social History Narrative  . Not on file    Objective: Vital Signs: BP (!) 171/72 (BP Location: Left Arm, Patient Position: Sitting, Cuff Size: Small)   Pulse (!) 49   Resp 12   Ht 4\' 11"  (1.499 m)   Wt 116 lb (52.6 kg)   BMI 23.43 kg/m    Physical Exam  Constitutional: She is oriented to person, place, and time. She appears well-developed and well-nourished.  HENT:  Head: Normocephalic and atraumatic.  Eyes: Conjunctivae and EOM are normal.  Neck: Normal range of motion.  Cardiovascular: Normal rate, regular rhythm, normal heart sounds and intact distal pulses.  Pulmonary/Chest: Effort normal and breath sounds normal.  Abdominal: Soft. Bowel sounds are normal.  Lymphadenopathy:    She has no cervical adenopathy.  Neurological: She is alert and oriented to person, place, and time.  Skin: Skin is warm and dry. Capillary refill takes less than 2 seconds.  Psychiatric: She has a  normal mood and affect. Her behavior is normal.  Nursing note and vitals reviewed.    Musculoskeletal Exam: C-spine limited ROM. Severe thoracic kyphosis. Limited ROM of lumbar spine.  No midline spinal tenderness.  No SI joint tenderness.  Limited abduction and internal rotation of bilateral shoulder joints. Mild left elbow joint contracture. Wrist joints, MCPs, PIPs, and DIPs good ROM with no synovitis.  PIP and DIP synovial thickening.  Subluxation of DIPs. Bilateral CMC joint synovial thickening.  Hip joints, knee joints, ankle joints, MTPs, PIPs, and DIPs good ROM with no synovitis.  Warmth of bilateral knee joints.  No tenderness or swelling of ankle joints.    CDAI Exam: CDAI Score: Not documented Patient Global Assessment: Not documented; Provider Global Assessment: Not documented Swollen: Not documented; Tender: Not documented Joint Exam   Not documented   There is currently no information documented on the homunculus. Go to the Rheumatology activity and complete the homunculus joint exam.  Investigation: No additional findings.  Imaging: No results found.  Recent Labs: Lab Results  Component Value Date   WBC 11.1 (H) 10/10/2017   HGB 11.8 (L) 10/10/2017   PLT 203.0 10/10/2017   NA 142 10/10/2017   K 3.5 10/10/2017   CL 104 10/10/2017   CO2 32 10/10/2017   GLUCOSE 119 (H) 10/10/2017   BUN 18 10/10/2017   CREATININE 1.08 10/10/2017   BILITOT 0.7 10/10/2017   ALKPHOS 55 10/10/2017   AST 18 10/10/2017   ALT 14 10/10/2017   PROT 6.3 10/10/2017   ALBUMIN 3.8 10/10/2017   CALCIUM 9.2 10/10/2017   GFRAA 63 07/27/2017    Speciality Comments: No specialty comments available.  Procedures:  No procedures performed Allergies: Amoxicillin-pot clavulanate and Sulfonamide derivatives   Assessment / Plan:     Visit Diagnoses: Age-related osteoporosis without current pathological fracture - DEXA scan performed on 05/03/2017 which revealed a T score of -5.2.  d/c Prolia in  2017.  We will reapply for Prolia today. Potential side effects of prolia were reviewed today in the office. She will continue on daily calcium and vitamin D supplements.  She has not had any recent falls or fractures.  She walks with a walker if she is walking prolonged distances.  We discussed the importance of fall prevention.  We will notify her to schedule her first prolia injection once it is approved through her insurance.  She will follow up for a routine office visit in 6 months.   Other kyphosis of thoracic region: She has  severe thoracic kyphosis.  She has no midline spinal tenderness at this time.   Primary osteoarthritis of both hands: PIP and DIP synovial thickening consistent with osteoarthritis of bilateral hands.  She has subluxation of several DIP joints.  She has bilateral CMC joint synovial thickening.  No synovitis noted.  Joint protection and muscle strengthening were discussed.  Primary osteoarthritis of both knees:  Mild warmth of bilateral knee joints.  Good ROM with no discomfort.  She walks with a walker if she has to walk prolonged distances.   Primary osteoarthritis of both feet: She has osteoarthritic changes in both feet.  She has no discomfort at this time.  She wears proper fitting shoes.    Other medical conditions are listed as follows:   History of anemia  History of gastroesophageal reflux (GERD)  History of hypercholesterolemia  History of atrial fibrillation   Orders: No orders of the defined types were placed in this encounter.  No orders of the defined types were placed in this encounter.   Follow-Up Instructions: Return in about 6 months (around 08/01/2018) for Osteoporosis, Osteoarthritis.   Ofilia Neas, PA-C  Note - This record has been created using Dragon software.  Chart creation errors have been sought, but may not always  have been located. Such creation errors do not reflect on  the standard of medical care.

## 2018-01-30 ENCOUNTER — Telehealth: Payer: Self-pay | Admitting: Pharmacy Technician

## 2018-01-30 ENCOUNTER — Ambulatory Visit (INDEPENDENT_AMBULATORY_CARE_PROVIDER_SITE_OTHER): Payer: Medicare Other | Admitting: Physician Assistant

## 2018-01-30 ENCOUNTER — Encounter: Payer: Self-pay | Admitting: Physician Assistant

## 2018-01-30 ENCOUNTER — Telehealth: Payer: Self-pay | Admitting: *Deleted

## 2018-01-30 VITALS — BP 171/72 | HR 49 | Resp 12 | Ht 59.0 in | Wt 116.0 lb

## 2018-01-30 DIAGNOSIS — M40294 Other kyphosis, thoracic region: Secondary | ICD-10-CM

## 2018-01-30 DIAGNOSIS — M19072 Primary osteoarthritis, left ankle and foot: Secondary | ICD-10-CM

## 2018-01-30 DIAGNOSIS — Z8719 Personal history of other diseases of the digestive system: Secondary | ICD-10-CM

## 2018-01-30 DIAGNOSIS — Z862 Personal history of diseases of the blood and blood-forming organs and certain disorders involving the immune mechanism: Secondary | ICD-10-CM

## 2018-01-30 DIAGNOSIS — M19041 Primary osteoarthritis, right hand: Secondary | ICD-10-CM | POA: Diagnosis not present

## 2018-01-30 DIAGNOSIS — M810 Age-related osteoporosis without current pathological fracture: Secondary | ICD-10-CM | POA: Diagnosis not present

## 2018-01-30 DIAGNOSIS — M170 Bilateral primary osteoarthritis of knee: Secondary | ICD-10-CM

## 2018-01-30 DIAGNOSIS — M19042 Primary osteoarthritis, left hand: Secondary | ICD-10-CM

## 2018-01-30 DIAGNOSIS — Z8639 Personal history of other endocrine, nutritional and metabolic disease: Secondary | ICD-10-CM

## 2018-01-30 DIAGNOSIS — M19071 Primary osteoarthritis, right ankle and foot: Secondary | ICD-10-CM

## 2018-01-30 DIAGNOSIS — Z8679 Personal history of other diseases of the circulatory system: Secondary | ICD-10-CM

## 2018-01-30 NOTE — Telephone Encounter (Signed)
Ok please keep me updated on the patient's decision and how she would like to proceed.

## 2018-01-30 NOTE — Telephone Encounter (Signed)
All grant foundations are closed at this time. Spoke to patient, to see if she would like to move forward, she would like to think about it and call back.

## 2018-01-30 NOTE — Telephone Encounter (Signed)
Received a Prior Authorization request from Cathy [name] for Cathy [name]. Authorization has been submitted to patient's insurance via Cover My Meds. Will update once we receive a response.  1:38 PM Cathy [name], CPhT

## 2018-01-30 NOTE — Telephone Encounter (Signed)
Please apply for Prolia per office note Jul 27, 2017

## 2018-01-30 NOTE — Telephone Encounter (Signed)
Received notification from [place] regarding a prior authorization for Prolia. Authorization has been APPROVED from 12/31/2017  to 01/30/2019.   Will send document to scan center.  Authorization # [id number]  Called plan, patient must fill at [place], patient's estimated copay is $205.  Patient's pharmacy benefit is a [employer] conservation officer. Called [employer], since patient has Medicare part B, she is ineligible for a copay card. Will look into grants for patient.   2:11 PM Beatriz Chancellor, CPhT

## 2018-02-20 ENCOUNTER — Encounter (HOSPITAL_COMMUNITY): Payer: Self-pay | Admitting: *Deleted

## 2018-02-20 ENCOUNTER — Other Ambulatory Visit: Payer: Self-pay

## 2018-02-20 ENCOUNTER — Inpatient Hospital Stay (HOSPITAL_COMMUNITY)
Admission: EM | Admit: 2018-02-20 | Discharge: 2018-03-02 | DRG: 392 | Disposition: A | Discharge status: Home Health | Payer: Medicare Other | Attending: Internal Medicine | Admitting: Internal Medicine

## 2018-02-20 DIAGNOSIS — K440 Diaphragmatic hernia with obstruction, without gangrene: Principal | ICD-10-CM | POA: Diagnosis present

## 2018-02-20 DIAGNOSIS — R1310 Dysphagia, unspecified: Secondary | ICD-10-CM | POA: Diagnosis present

## 2018-02-20 DIAGNOSIS — Z833 Family history of diabetes mellitus: Secondary | ICD-10-CM

## 2018-02-20 DIAGNOSIS — M1990 Unspecified osteoarthritis, unspecified site: Secondary | ICD-10-CM | POA: Diagnosis present

## 2018-02-20 DIAGNOSIS — Z1612 Extended spectrum beta lactamase (ESBL) resistance: Secondary | ICD-10-CM | POA: Diagnosis present

## 2018-02-20 DIAGNOSIS — I110 Hypertensive heart disease with heart failure: Secondary | ICD-10-CM | POA: Diagnosis present

## 2018-02-20 DIAGNOSIS — Z8049 Family history of malignant neoplasm of other genital organs: Secondary | ICD-10-CM

## 2018-02-20 DIAGNOSIS — R197 Diarrhea, unspecified: Secondary | ICD-10-CM

## 2018-02-20 DIAGNOSIS — K56609 Unspecified intestinal obstruction, unspecified as to partial versus complete obstruction: Secondary | ICD-10-CM | POA: Diagnosis not present

## 2018-02-20 DIAGNOSIS — I10 Essential (primary) hypertension: Secondary | ICD-10-CM | POA: Diagnosis present

## 2018-02-20 DIAGNOSIS — I2510 Atherosclerotic heart disease of native coronary artery without angina pectoris: Secondary | ICD-10-CM | POA: Diagnosis present

## 2018-02-20 DIAGNOSIS — I454 Nonspecific intraventricular block: Secondary | ICD-10-CM | POA: Diagnosis present

## 2018-02-20 DIAGNOSIS — Z881 Allergy status to other antibiotic agents status: Secondary | ICD-10-CM

## 2018-02-20 DIAGNOSIS — Z9049 Acquired absence of other specified parts of digestive tract: Secondary | ICD-10-CM

## 2018-02-20 DIAGNOSIS — N390 Urinary tract infection, site not specified: Secondary | ICD-10-CM | POA: Diagnosis present

## 2018-02-20 DIAGNOSIS — Z800 Family history of malignant neoplasm of digestive organs: Secondary | ICD-10-CM

## 2018-02-20 DIAGNOSIS — R935 Abnormal findings on diagnostic imaging of other abdominal regions, including retroperitoneum: Secondary | ICD-10-CM | POA: Diagnosis present

## 2018-02-20 DIAGNOSIS — E7800 Pure hypercholesterolemia, unspecified: Secondary | ICD-10-CM | POA: Diagnosis present

## 2018-02-20 DIAGNOSIS — Z7901 Long term (current) use of anticoagulants: Secondary | ICD-10-CM

## 2018-02-20 DIAGNOSIS — Z823 Family history of stroke: Secondary | ICD-10-CM

## 2018-02-20 DIAGNOSIS — Z801 Family history of malignant neoplasm of trachea, bronchus and lung: Secondary | ICD-10-CM

## 2018-02-20 DIAGNOSIS — I5042 Chronic combined systolic (congestive) and diastolic (congestive) heart failure: Secondary | ICD-10-CM | POA: Diagnosis present

## 2018-02-20 DIAGNOSIS — R001 Bradycardia, unspecified: Secondary | ICD-10-CM | POA: Diagnosis present

## 2018-02-20 DIAGNOSIS — G629 Polyneuropathy, unspecified: Secondary | ICD-10-CM | POA: Diagnosis present

## 2018-02-20 DIAGNOSIS — I4892 Unspecified atrial flutter: Secondary | ICD-10-CM | POA: Diagnosis present

## 2018-02-20 DIAGNOSIS — N3000 Acute cystitis without hematuria: Secondary | ICD-10-CM

## 2018-02-20 DIAGNOSIS — K449 Diaphragmatic hernia without obstruction or gangrene: Secondary | ICD-10-CM

## 2018-02-20 DIAGNOSIS — Z87891 Personal history of nicotine dependence: Secondary | ICD-10-CM

## 2018-02-20 DIAGNOSIS — M810 Age-related osteoporosis without current pathological fracture: Secondary | ICD-10-CM | POA: Diagnosis present

## 2018-02-20 DIAGNOSIS — Z882 Allergy status to sulfonamides status: Secondary | ICD-10-CM

## 2018-02-20 DIAGNOSIS — Z79890 Hormone replacement therapy (postmenopausal): Secondary | ICD-10-CM

## 2018-02-20 DIAGNOSIS — F0390 Unspecified dementia without behavioral disturbance: Secondary | ICD-10-CM | POA: Diagnosis present

## 2018-02-20 DIAGNOSIS — Z90710 Acquired absence of both cervix and uterus: Secondary | ICD-10-CM

## 2018-02-20 DIAGNOSIS — E538 Deficiency of other specified B group vitamins: Secondary | ICD-10-CM | POA: Diagnosis present

## 2018-02-20 DIAGNOSIS — Z8249 Family history of ischemic heart disease and other diseases of the circulatory system: Secondary | ICD-10-CM

## 2018-02-20 DIAGNOSIS — E039 Hypothyroidism, unspecified: Secondary | ICD-10-CM | POA: Diagnosis present

## 2018-02-20 DIAGNOSIS — I4891 Unspecified atrial fibrillation: Secondary | ICD-10-CM | POA: Diagnosis not present

## 2018-02-20 DIAGNOSIS — Z96659 Presence of unspecified artificial knee joint: Secondary | ICD-10-CM | POA: Diagnosis present

## 2018-02-20 DIAGNOSIS — B9620 Unspecified Escherichia coli [E. coli] as the cause of diseases classified elsewhere: Secondary | ICD-10-CM | POA: Diagnosis present

## 2018-02-20 DIAGNOSIS — K5730 Diverticulosis of large intestine without perforation or abscess without bleeding: Secondary | ICD-10-CM | POA: Diagnosis present

## 2018-02-20 HISTORY — DX: Enterocolitis due to Clostridium difficile, not specified as recurrent: A04.72

## 2018-02-20 LAB — CBC
HCT: 36.9 % (ref 36.0–46.0)
Hemoglobin: 11.7 g/dL — ABNORMAL LOW (ref 12.0–15.0)
MCH: 29.6 pg (ref 26.0–34.0)
MCHC: 31.7 g/dL (ref 30.0–36.0)
MCV: 93.4 fL (ref 80.0–100.0)
Platelets: 167 10*3/uL (ref 150–400)
RBC: 3.95 MIL/uL (ref 3.87–5.11)
RDW: 14.5 % (ref 11.5–15.5)
WBC: 11.3 10*3/uL — ABNORMAL HIGH (ref 4.0–10.5)
nRBC: 0 % (ref 0.0–0.2)

## 2018-02-20 LAB — URINALYSIS, ROUTINE W REFLEX MICROSCOPIC
Bilirubin Urine: NEGATIVE
Glucose, UA: NEGATIVE mg/dL
Ketones, ur: NEGATIVE mg/dL
Nitrite: NEGATIVE
Protein, ur: NEGATIVE mg/dL
Specific Gravity, Urine: 1.016 (ref 1.005–1.030)
WBC, UA: >50 WBC/hpf — ABNORMAL HIGH (ref 0–5)
pH: 6 (ref 5.0–8.0)

## 2018-02-20 LAB — COMPREHENSIVE METABOLIC PANEL
ALBUMIN: 3.1 g/dL — AB (ref 3.5–5.0)
ALT: 21 U/L (ref 0–44)
AST: 26 U/L (ref 15–41)
Alkaline Phosphatase: 49 U/L (ref 38–126)
Anion gap: 9 (ref 5–15)
BUN: 14 mg/dL (ref 8–23)
CO2: 27 mmol/L (ref 22–32)
Calcium: 8.9 mg/dL (ref 8.9–10.3)
Chloride: 105 mmol/L (ref 98–111)
Creatinine, Ser: 0.9 mg/dL (ref 0.44–1.00)
GFR calc Af Amer: >60 mL/min (ref >60)
GFR calc non Af Amer: 57 mL/min — ABNORMAL LOW (ref >60)
GLUCOSE: 126 mg/dL — AB (ref 70–99)
Potassium: 3.4 mmol/L — ABNORMAL LOW (ref 3.5–5.1)
Sodium: 141 mmol/L (ref 135–145)
Total Bilirubin: 0.7 mg/dL (ref 0.3–1.2)
Total Protein: 5.5 g/dL — ABNORMAL LOW (ref 6.5–8.1)

## 2018-02-20 LAB — LIPASE, BLOOD: Lipase: 73 U/L — ABNORMAL HIGH (ref 11–51)

## 2018-02-20 NOTE — ED Triage Notes (Signed)
abd pain since yesterday  She reports that she cannot eat normal bms

## 2018-02-21 ENCOUNTER — Encounter (HOSPITAL_COMMUNITY): Payer: Self-pay | Admitting: Internal Medicine

## 2018-02-21 ENCOUNTER — Other Ambulatory Visit: Payer: Self-pay

## 2018-02-21 ENCOUNTER — Emergency Department (HOSPITAL_COMMUNITY): Payer: Medicare Other

## 2018-02-21 DIAGNOSIS — K56609 Unspecified intestinal obstruction, unspecified as to partial versus complete obstruction: Secondary | ICD-10-CM | POA: Diagnosis present

## 2018-02-21 DIAGNOSIS — G629 Polyneuropathy, unspecified: Secondary | ICD-10-CM | POA: Diagnosis present

## 2018-02-21 DIAGNOSIS — Z9049 Acquired absence of other specified parts of digestive tract: Secondary | ICD-10-CM | POA: Diagnosis not present

## 2018-02-21 DIAGNOSIS — E038 Other specified hypothyroidism: Secondary | ICD-10-CM | POA: Diagnosis not present

## 2018-02-21 DIAGNOSIS — M1990 Unspecified osteoarthritis, unspecified site: Secondary | ICD-10-CM | POA: Diagnosis present

## 2018-02-21 DIAGNOSIS — R001 Bradycardia, unspecified: Secondary | ICD-10-CM | POA: Diagnosis not present

## 2018-02-21 DIAGNOSIS — E7800 Pure hypercholesterolemia, unspecified: Secondary | ICD-10-CM | POA: Diagnosis present

## 2018-02-21 DIAGNOSIS — I5042 Chronic combined systolic (congestive) and diastolic (congestive) heart failure: Secondary | ICD-10-CM | POA: Diagnosis present

## 2018-02-21 DIAGNOSIS — Z1612 Extended spectrum beta lactamase (ESBL) resistance: Secondary | ICD-10-CM | POA: Diagnosis present

## 2018-02-21 DIAGNOSIS — Z823 Family history of stroke: Secondary | ICD-10-CM | POA: Diagnosis not present

## 2018-02-21 DIAGNOSIS — Z90710 Acquired absence of both cervix and uterus: Secondary | ICD-10-CM | POA: Diagnosis not present

## 2018-02-21 DIAGNOSIS — Z801 Family history of malignant neoplasm of trachea, bronchus and lung: Secondary | ICD-10-CM | POA: Diagnosis not present

## 2018-02-21 DIAGNOSIS — I110 Hypertensive heart disease with heart failure: Secondary | ICD-10-CM | POA: Diagnosis present

## 2018-02-21 DIAGNOSIS — E039 Hypothyroidism, unspecified: Secondary | ICD-10-CM | POA: Diagnosis present

## 2018-02-21 DIAGNOSIS — Z7901 Long term (current) use of anticoagulants: Secondary | ICD-10-CM | POA: Diagnosis not present

## 2018-02-21 DIAGNOSIS — I10 Essential (primary) hypertension: Secondary | ICD-10-CM | POA: Diagnosis not present

## 2018-02-21 DIAGNOSIS — Z8249 Family history of ischemic heart disease and other diseases of the circulatory system: Secondary | ICD-10-CM | POA: Diagnosis not present

## 2018-02-21 DIAGNOSIS — R1310 Dysphagia, unspecified: Secondary | ICD-10-CM | POA: Diagnosis present

## 2018-02-21 DIAGNOSIS — I2510 Atherosclerotic heart disease of native coronary artery without angina pectoris: Secondary | ICD-10-CM | POA: Diagnosis present

## 2018-02-21 DIAGNOSIS — E538 Deficiency of other specified B group vitamins: Secondary | ICD-10-CM | POA: Diagnosis present

## 2018-02-21 DIAGNOSIS — R935 Abnormal findings on diagnostic imaging of other abdominal regions, including retroperitoneum: Secondary | ICD-10-CM | POA: Diagnosis not present

## 2018-02-21 DIAGNOSIS — Z96659 Presence of unspecified artificial knee joint: Secondary | ICD-10-CM | POA: Diagnosis present

## 2018-02-21 DIAGNOSIS — K5730 Diverticulosis of large intestine without perforation or abscess without bleeding: Secondary | ICD-10-CM | POA: Diagnosis present

## 2018-02-21 DIAGNOSIS — I4892 Unspecified atrial flutter: Secondary | ICD-10-CM | POA: Diagnosis present

## 2018-02-21 DIAGNOSIS — K449 Diaphragmatic hernia without obstruction or gangrene: Secondary | ICD-10-CM | POA: Diagnosis not present

## 2018-02-21 DIAGNOSIS — N390 Urinary tract infection, site not specified: Secondary | ICD-10-CM | POA: Diagnosis present

## 2018-02-21 DIAGNOSIS — Z79890 Hormone replacement therapy (postmenopausal): Secondary | ICD-10-CM | POA: Diagnosis not present

## 2018-02-21 DIAGNOSIS — K5669 Other partial intestinal obstruction: Secondary | ICD-10-CM | POA: Diagnosis not present

## 2018-02-21 DIAGNOSIS — Z833 Family history of diabetes mellitus: Secondary | ICD-10-CM | POA: Diagnosis not present

## 2018-02-21 DIAGNOSIS — M810 Age-related osteoporosis without current pathological fracture: Secondary | ICD-10-CM | POA: Diagnosis present

## 2018-02-21 DIAGNOSIS — K56600 Partial intestinal obstruction, unspecified as to cause: Secondary | ICD-10-CM | POA: Diagnosis not present

## 2018-02-21 LAB — TSH: TSH: 9.865 u[IU]/mL — ABNORMAL HIGH (ref 0.350–4.500)

## 2018-02-21 LAB — TROPONIN I

## 2018-02-21 MED ORDER — AMIODARONE HCL 200 MG PO TABS
200.0000 mg | ORAL_TABLET | Freq: Every day | ORAL | Status: DC
  Administered 2018-02-21 – 2018-03-02 (×10): 200 mg via ORAL
  Filled 2018-02-21 (×10): qty 1

## 2018-02-21 MED ORDER — LEVOTHYROXINE SODIUM 100 MCG/5ML IV SOLN
37.5000 ug | Freq: Every day | INTRAVENOUS | Status: DC
  Administered 2018-02-22 – 2018-02-23 (×2): 37.5 ug via INTRAVENOUS
  Filled 2018-02-21 (×2): qty 5

## 2018-02-21 MED ORDER — ENOXAPARIN SODIUM 60 MG/0.6ML ~~LOC~~ SOLN
1.0000 mg/kg | Freq: Two times a day (BID) | SUBCUTANEOUS | Status: DC
  Administered 2018-02-21 – 2018-02-23 (×5): 50 mg via SUBCUTANEOUS
  Filled 2018-02-21 (×5): qty 0.6

## 2018-02-21 MED ORDER — LEVOTHYROXINE SODIUM 100 MCG/5ML IV SOLN: 25.0000 ug | Freq: Every day | INTRAVENOUS | Status: DC

## 2018-02-21 MED ORDER — PROCHLORPERAZINE EDISYLATE 10 MG/2ML IJ SOLN: 10.0000 mg | INTRAMUSCULAR | Status: DC | PRN

## 2018-02-21 MED ORDER — METRONIDAZOLE IN NACL 5-0.79 MG/ML-% IV SOLN: 500.0000 mg | Freq: Once | INTRAVENOUS | Status: DC

## 2018-02-21 MED ORDER — IOPAMIDOL (ISOVUE-370) INJECTION 76%
INTRAVENOUS | Status: AC
  Administered 2018-02-21: 100 mL
  Filled 2018-02-21: qty 100

## 2018-02-21 MED ORDER — SODIUM CHLORIDE 0.9 % IV SOLN
1.0000 g | Freq: Once | INTRAVENOUS | Status: AC
  Administered 2018-02-21: 1 g via INTRAVENOUS
  Filled 2018-02-21: qty 10

## 2018-02-21 MED ORDER — CIPROFLOXACIN IN D5W 400 MG/200ML IV SOLN: 400.0000 mg | Freq: Once | INTRAVENOUS | Status: DC

## 2018-02-21 MED ORDER — SODIUM CHLORIDE 0.9 % IV SOLN
INTRAVENOUS | Status: DC
  Administered 2018-02-21 – 2018-02-23 (×5): via INTRAVENOUS

## 2018-02-21 NOTE — ED Provider Notes (Signed)
  Physical Exam  BP (!) 136/59   Pulse (!) 41   Temp 98.3 F (36.8 C) (Oral)   Resp 14   Ht 4\' 11"  (1.499 m)   Wt 50.8 kg   SpO2 92%   BMI 22.62 kg/m   Physical Exam  ED Course/Procedures     Procedures  MDM  Care assumed at 7 am.  Patient here with some nausea and poor appetite yesterday.  Per the daughter-in-law, patient has abdominal pain, nausea since yesterday. Has known hiatal hernia. Sign out pending CT ab/pel.   9:22 AM  CT showed hiatal hernia with partial colon obstruction. I talked to general surgery to see patient. Will admit to hospitalist service.        Drenda Freeze, MD 02/21/18 [phone]

## 2018-02-21 NOTE — Progress Notes (Signed)
ANTICOAGULATION CONSULT NOTE - Initial Consult  Pharmacy Consult for Enoxaparin Indication: atrial fibrillation  Allergies  Allergen Reactions  . Amoxicillin-Pot Clavulanate Diarrhea    Has patient had a PCN reaction causing immediate rash, facial/tongue/throat swelling, SOB or lightheadedness with hypotension: no Has patient had a PCN reaction causing severe rash involving mucus membranes or skin necrosis:  no Has patient had a PCN reaction that required hospitalization: no Has patient had a PCN reaction occurring within the last 10 years no If all of the above answers are "NO", then may proceed with Cephalosporin use.   . Sulfonamide Derivatives Other (See Comments)    unknown    Patient Measurements: Height: 4\' 11"  (149.9 cm) Weight: 112 lb (50.8 kg) IBW/kg (Calculated) : 43.2  Vital Signs: Temp: 98.8 F (37.1 C) (12/24 1129) Temp Source: Oral (12/24 1129) BP: 164/65 (12/24 1129) Pulse Rate: 40 (12/24 1129)  Labs: Recent Labs    02/20/18 2146 02/21/18 0534  HGB 11.7*  --   HCT 36.9  --   PLT 167  --   CREATININE 0.90  --   TROPONINI  --  <0.03    Estimated Creatinine Clearance: 29.5 mL/min (by C-G formula based on SCr of 0.9 mg/dL).   Medical History: Past Medical History:  Diagnosis Date  . Allergic rhinitis   . Anemia   . Anxiety   . Chronic systolic CHF (congestive heart failure) ([place])    a. 2D Echo 02/02/15: EF 25-30%, akinesis of mid-apical anteroseptal myocardium, grade 3 DD, mod MR, severely dilated LA, mildly reduced RV systoluc function, mod dilated RA, mod TR, mildly increased PASP 90mmHg. b. 10/2016: EF 55-60% with no regional WMA. Grade 3 DD.   [name] Colitis    lymphocytic colitis feb 2011  . Coronary artery calcification    a. By CT 01/2015.  . Diverticulosis of colon   . DJD (degenerative joint disease)    gets epidural injections  . Dysphagia   . Hiatal hernia   . Hypercholesteremia   . Hypertension   . Iron deficiency   . Irritable bowel  syndrome   . Liver lesion 02/18/2015  . Lumbar back pain   . Osteoporosis   . Paroxysmal atrial flutter ([place])    a. Dx 01/2015 - underwent DCCV 03/2015; takes Eliquis  . Sinus bradycardia   . Splenic lesion 02/18/2015  . Urinary incontinence   . Venous insufficiency   . Vitamin B12 deficiency     Medications:  Scheduled:  . amiodarone  200 mg Oral Daily  . enoxaparin (LOVENOX) injection  1 mg/kg Subcutaneous BID  . levothyroxine  25 mcg Intravenous Daily    Assessment: Cathy [name] is an 31 YOF who presented on 12/23 with abdominal pain. PMH includes aflutter and she is on Eliquis 2.5 mg PO BID PTA. Pharmacy has been consulted to switch from Eliquis to Enoxaparin as pt is currently NPO.  Last dose of Eliquis was reported on 12/23 at 1000. CBC is within normal limits. Patient's renal function is around her baseline with CrCl ~30 mL/min. Since patient's last dose of Eliquis was over 12 hours ago, will start Lovenox at 1 mg/kg SubQ q12h.  Goal of Therapy:  Therapeutic anticoagulation Monitor platelets by anticoagulation protocol: Yes   Plan:  Lovenox 50 mg (~1 mg/kg) SubQ q12h Monitor renal function, CBC daily. Continue to reassess patient's ability to take PO meds  Jackson Latino, PharmD PGY1 Pharmacy Resident Phone [phone] 02/21/2018     11:43 AM

## 2018-02-21 NOTE — ED Notes (Signed)
Pt unable to use bedpan. Pt assisted to a bedside commode.

## 2018-02-21 NOTE — Consult Note (Signed)
Surgical Consultation Requesting provider: Dr. Darl Householder  CC: abdominal pain  HPI: this is an 84 year old woman with a history of coronary artery disease, congestive heart failure, colitis, hypertension, atrial flutter who presents with abdominal pain. Her family reports that she has complained of periumbilical postprandial abdominal pain for the last 24 hours or so. She does not provide much history but currently is pain free. Does endorse nausea without vomiting. Reports normal stools, denies any melena, hematochezia or diarrhea. Denies fevers. Per family she seemed to be short of breath earlier, but denied any chest pain.  Her workup in the emergency department is consistent with a urinary tract infection, and addition to pleural effusions, and a massive paraesophageal hernia containing stomach and transverse colon with possible partial obstruction, in addition to possible pneumonia although she does not have any pneumonia symptoms.  Allergies  Allergen Reactions  . Amoxicillin-Pot Clavulanate Diarrhea    Has patient had a PCN reaction causing immediate rash, facial/tongue/throat swelling, SOB or lightheadedness with hypotension: no Has patient had a PCN reaction causing severe rash involving mucus membranes or skin necrosis:  no Has patient had a PCN reaction that required hospitalization: no Has patient had a PCN reaction occurring within the last 10 years no If all of the above answers are "NO", then may proceed with Cephalosporin use.   . Sulfonamide Derivatives Other (See Comments)    unknown    Past Medical History:  Diagnosis Date  . Allergic rhinitis   . Anemia   . Anxiety   . Chronic systolic CHF (congestive heart failure) ([place])    a. 2D Echo 02/02/15: EF 25-30%, akinesis of mid-apical anteroseptal myocardium, grade 3 DD, mod MR, severely dilated LA, mildly reduced RV systoluc function, mod dilated RA, mod TR, mildly increased PASP 59mmHg. b. 10/2016: EF 55-60% with no regional WMA.  Grade 3 DD.   [name] Colitis    lymphocytic colitis feb 2011  . Coronary artery calcification    a. By CT 01/2015.  . Diverticulosis of colon   . DJD (degenerative joint disease)    gets epidural injections  . Dysphagia   . Hiatal hernia   . Hypercholesteremia   . Hypertension   . Iron deficiency   . Irritable bowel syndrome   . Liver lesion 02/18/2015  . Lumbar back pain   . Osteoporosis   . Paroxysmal atrial flutter ([place])    a. Dx 01/2015 - underwent DCCV 03/2015; takes Eliquis  . Sinus bradycardia   . Splenic lesion 02/18/2015  . Urinary incontinence   . Venous insufficiency   . Vitamin B12 deficiency     Past Surgical History:  Procedure Laterality Date  . ABDOMINAL HYSTERECTOMY    . CARDIOVERSION N/A 02/03/2015   Procedure: CARDIOVERSION;  Surgeon: Sueanne Margarita, MD;  Location: [facility] ENDOSCOPY;  Service: Cardiovascular;  Laterality: N/A;  . CARDIOVERSION N/A 03/10/2015   Procedure: CARDIOVERSION;  Surgeon: Dorothy Spark, MD;  Location: [place];  Service: Cardiovascular;  Laterality: N/A;  . CARDIOVERSION N/A 01/07/2017   Procedure: CARDIOVERSION;  Surgeon: Pixie Casino, MD;  Location: [facility] ENDOSCOPY;  Service: Cardiovascular;  Laterality: N/A;  . cataract surgery    . decompressive lumbar laminectomy  06/2008   at L2-3, L3-4 and L4-5 by Dr. Ronnald Ramp  . LAPAROSCOPIC CHOLECYSTECTOMY  04/2009   Dr. Abran Cantor  . REPLACEMENT TOTAL KNEE    . TEE WITHOUT CARDIOVERSION N/A 02/03/2015   Procedure: TRANSESOPHAGEAL ECHOCARDIOGRAM (TEE);  Surgeon: Sueanne Margarita, MD;  Location: [place];  Service: Cardiovascular;  Laterality: N/A;    Family History  Problem Relation Age of Onset  . Cancer Brother        lung  . Diabetes Son   . Coronary artery disease Son   . Kidney disease Son   . Stroke Son   . Heart attack Mother   . Cancer Brother        colon  . Cancer Daughter        cervical, lung   . Heart attack Brother   . Diabetes Brother   . Heart attack Sister   .  Hypertension Neg Hx   . Fainting Neg Hx     Social History   Socioeconomic History  . Marital status: Widowed    Spouse name: Not on file  . Number of children: Not on file  . Years of education: Not on file  . Highest education level: Not on file  Occupational History  . Occupation: retired  Scientific laboratory technician  . Financial resource strain: Not on file  . Food insecurity:    Worry: Not on file    Inability: Not on file  . Transportation needs:    Medical: Not on file    Non-medical: Not on file  Tobacco Use  . Smoking status: Former Smoker    Packs/day: 0.50    Years: 10.00    Pack years: 5.00    Types: Cigarettes  . Smokeless tobacco: Never Used  Substance and Sexual Activity  . Alcohol use: No  . Drug use: Never  . Sexual activity: Not on file  Lifestyle  . Physical activity:    Days per week: Not on file    Minutes per session: Not on file  . Stress: Not on file  Relationships  . Social connections:    Talks on phone: Not on file    Gets together: Not on file    Attends religious service: Not on file    Active member of club or organization: Not on file    Attends meetings of clubs or organizations: Not on file    Relationship status: Not on file  Other Topics Concern  . Not on file  Social History Narrative  . Not on file    No current facility-administered medications on file prior to encounter.    Current Outpatient Medications on File Prior to Encounter  Medication Sig Dispense Refill  . amiodarone (PACERONE) 200 MG tablet TAKE 1 TABLET DAILY (Patient taking differently: Take 200 mg by mouth daily. ) 90 tablet 0  . atorvastatin (LIPITOR) 20 MG tablet TAKE 1 TABLET DAILY AT 6 P.M. (Patient taking differently: Take 20 mg by mouth daily at 6 PM. ) 90 tablet 2  . calcium-vitamin D (OSCAL WITH D) 500-200 MG-UNIT per tablet Take 1 tablet by mouth daily.     . cholecalciferol (VITAMIN D) 1000 UNITS tablet Take 1,000 Units by mouth daily.    . diclofenac (FLECTOR)  1.3 % PTCH Place 1 patch onto the skin daily.    [name] ELIQUIS 2.5 MG TABS tablet TAKE 1 TABLET TWICE A DAY (Patient taking differently: Take 2.5 mg by mouth 2 (two) times daily. ) 180 tablet 1  . esomeprazole (NEXIUM) 40 MG capsule TAKE 1 CAPSULE DAILY (Patient taking differently: Take 40 mg by mouth daily. ) 90 capsule 3  . furosemide (LASIX) 20 MG tablet Take 1 tablet (20 mg total) by mouth every other day. 30 tablet 6  . isosorbide mononitrate (IMDUR) 30 MG 24  hr tablet TAKE 1 TABLET(30 MG) BY MOUTH DAILY (Patient taking differently: Take 30 mg by mouth daily. ) 90 tablet 3  . levothyroxine (SYNTHROID, LEVOTHROID) 50 MCG tablet Take 1 tablet (50 mcg total) by mouth daily. 90 tablet 3  . polyethylene glycol (MIRALAX / GLYCOLAX) packet Take 17 g by mouth 2 (two) times daily. (Patient taking differently: Take 17 g by mouth daily as needed for mild constipation. ) 28 each 0  . pregabalin (LYRICA) 50 MG capsule Take 1 capsule (50 mg total) by mouth 3 (three) times daily. 270 capsule 1  . senna-docusate (SENOKOT-S) 8.6-50 MG tablet Take 1 tablet by mouth at bedtime. (Patient taking differently: Take 1 tablet by mouth at bedtime as needed for mild constipation. ) 30 tablet 0  . solifenacin (VESICARE) 10 MG tablet Take 1 tablet (10 mg total) by mouth daily. 90 tablet 1  . vitamin B-12 (CYANOCOBALAMIN) 1000 MCG tablet Take 1 tablet (1,000 mcg total) by mouth daily.    . furosemide (LASIX) 20 MG tablet TAKE 1 TABLET DAILY (Patient not taking: Reported on 02/21/2018) 90 tablet 4    Review of Systems: a complete, 10pt review of systems was completed with pertinent positives and negatives as documented in the HPI  Physical Exam: Vitals:   02/21/18 0815 02/21/18 0900  BP: (!) 162/64 (!) 136/59  Pulse: (!) 40 (!) 41  Resp: 16 14  Temp:    SpO2: 93% 92%   Gen: patient is alert and cooperative, no distress, kyphotic and frail-appearing elderly woman, having a bowel movement on the bedside commode Head:  normocephalic, atraumatic Eyes: extraocular motions intact, anicteric.  Neck: supple without mass or thyromegaly Chest: unlabored respirations, mildly increased respiratory rate Cardiovascular: RRR  Abdomen: soft, nondistended, nontender. No mass or organomegaly.  Extremities: warm, without edema, no deformities  Neuro: grossly intact Psych: appropriate mood and affect, normal insight  Skin: warm and dry   CBC Latest Ref Rng & Units 02/20/2018 10/10/2017 07/27/2017  WBC 4.0 - 10.5 K/uL 11.3(H) 11.1(H) 8.0  Hemoglobin 12.0 - 15.0 g/dL 11.7(L) 11.8(L) 11.5(L)  Hematocrit 36.0 - 46.0 % 36.9 34.6(L) 33.1(L)  Platelets 150 - 400 K/uL 167 203.0 183    CMP Latest Ref Rng & Units 02/20/2018 10/10/2017 07/27/2017  Glucose 70 - 99 mg/dL 126(H) 119(H) 72  BUN 8 - 23 mg/dL 14 18 14   Creatinine 0.44 - 1.00 mg/dL 0.90 1.08 0.94(H)  Sodium 135 - 145 mmol/L 141 142 140  Potassium 3.5 - 5.1 mmol/L 3.4(L) 3.5 4.7  Chloride 98 - 111 mmol/L 105 104 103  CO2 22 - 32 mmol/L 27 32 31  Calcium 8.9 - 10.3 mg/dL 8.9 9.2 9.2  Total Protein 6.5 - 8.1 g/dL 5.5(L) 6.3 5.7(L)  Total Bilirubin 0.3 - 1.2 mg/dL 0.7 0.7 0.6  Alkaline Phos 38 - 126 U/L 49 55 -  AST 15 - 41 U/L 26 18 21   ALT 0 - 44 U/L 21 14 17     Lab Results  Component Value Date   INR 1.54 11/29/2016   INR 1.33 03/09/2010   INR 1.31 03/08/2010    Imaging: Dg Chest 2 View  Result Date: 02/21/2018 CLINICAL DATA:  Acute onset of intermittent right lower quadrant abdominal pain and generalized chest pain. Shortness of breath. EXAM: CHEST - 2 VIEW COMPARISON:  Chest radiograph performed 12/01/2016 FINDINGS: The lungs are well-aerated. Small bilateral pleural effusions are noted. Bibasilar airspace opacities may reflect atelectasis or pneumonia. There is no evidence of pneumothorax. The heart  is borderline normal in size. No acute osseous abnormalities are seen. The patient's very large paraesophageal hernia is again noted. Mild degenerative change  is noted at the glenohumeral joints bilaterally. IMPRESSION: 1. Small bilateral pleural effusions. Bibasilar airspace opacities may reflect atelectasis or pneumonia. 2. Very large paraesophageal hernia again noted. Electronically Signed   By: Garald Balding M.D.   On: 02/21/2018 05:28   Ct Angio Chest Pe W And/or Wo Contrast  Result Date: 02/21/2018 CLINICAL DATA:  84 year old female with periumbilical abdominal pain for the past 24 hours and nausea but no vomiting. Extensive medical history including coronary artery disease, congestive heart failure, hypertension, atrial flutter and prior colitis. EXAM: CT ANGIOGRAPHY CHEST CT ABDOMEN AND PELVIS WITH CONTRAST TECHNIQUE: Multidetector CT imaging of the chest was performed using the standard protocol during bolus administration of intravenous contrast. Multiplanar CT image reconstructions and MIPs were obtained to evaluate the vascular anatomy. Multidetector CT imaging of the abdomen and pelvis was performed using the standard protocol during bolus administration of intravenous contrast. CONTRAST:  166mL ISOVUE-370 IOPAMIDOL (ISOVUE-370) INJECTION 76% COMPARISON:  Most recent prior CT scan of the abdomen and pelvis 11/27/2016; prior PET-CT including chest CT 02/21/2015 FINDINGS: CTA CHEST FINDINGS Cardiovascular: Adequate opacification of the pulmonary arteries to the proximal segmental level. No evidence of filling defect to suggest acute pulmonary embolus. Marked mass effect on the heart secondary to the massive hiatal hernia. Calcification present in the mitral valve annulus. CT atherosclerotic calcifications noted along the coronary arteries. No pericardial effusion. Mediastinum/Nodes: Progressive massive hiatal hernia containing the entire stomach and a large segment of colon. There has been substantial worsening compared to 11/27/2016. Lungs/Pleura: Moderately large layering right pleural effusion. Trace left pleural effusion. Marked atelectasis of both  lower lobe secondary to a combination of pleural effusion and mass effect from the hiatal hernia. Musculoskeletal: Heavily exaggerated thoracic kyphosis. Multilevel degenerative disc disease and facet arthropathy. No acute fracture, malalignment or evidence of lytic or blastic osseous lesion. Review of the MIP images confirms the above findings. CT ABDOMEN and PELVIS FINDINGS Hepatobiliary: Punctate calcification in the right liver, unchanged and likely sequelae of old granulomatous disease. No suspicious liver lesion. The gallbladder is surgically absent. Similar degree of relatively severe intra and extrahepatic biliary ductal dilatation compared to prior imaging from 11/27/2016. Common bile duct again measures 14 mm. No definite distal mass or filling defect. Pancreas: No definite pancreatic mass or inflammatory changes. Anatomy is distorted by upward migration of the stomach and transverse colon. Spleen: Similar appearance of the spleen with innumerable low-attenuation lesions scattered throughout. Lesions are not substantially changed in size, or configuration compared to prior imaging from 2018 and 2016. These lesions demonstrated no evidence of hypermetabolic activity on prior PET-CT and are considered benign. Adrenals/Urinary Tract: Stable 3 cm left adrenal adenoma. The right adrenal gland is unremarkable. Mild renal cortical thinning bilaterally with small areas of renal scarring versus tiny cyst formation. No hydronephrosis, nephrolithiasis or enhancing renal mass. Stomach/Bowel: Herniation of a large segments of the transverse colon into the large hiatal hernia. There is an approximately 3.8 cm segment of circumferential narrowing in the region of the displaced hepatic flexure which is nonspecific. Additionally, there is a second region of focal mild wall thickening and narrowing measuring approximately 4.6 cm in the proximal transverse colon where the colon passes through the hiatal hernia defect. There  is a third region of colonic narrowing were the more distal transverse colon exits the hiatal hernia just proximal to the splenic flexure.  The cecum and ascending colon are mildly distended proximal to these 2 sites and low-grade obstruction is not excluded. Vascular/Lymphatic: Atherosclerotic calcifications throughout the highly tortuous abdominal aorta. No evidence of dissection or aneurysm. No suspicious lymphadenopathy. Reproductive: Surgical changes of hysterectomy. Significant pelvic floor laxity with descent of small bowel into the region of laxity. Other: Reticulation of the subcutaneous fat diffusely consistent with body wall edema or anasarca. No peritoneal ascites. Musculoskeletal: Severe multilevel degenerative disc disease and facet arthropathy. No definite acute compression fracture. No lytic or blastic osseous lesion. Review of the MIP images confirms the above findings. IMPRESSION: 1. Negative for acute pulmonary embolus. 2. There has been significant interval progression of a now massive hiatal hernia compared to prior imaging from 2018 and 2016. The entire stomach and the majority of the transverse colon are now intrathoracic and there is imaging evidence (diffuse mild dilatation of the cecum and ascending colon) concerning for low grade or partial colonic obstruction. No evidence of colonic wall thickening, inflammation or perforation at this time. There are 3 areas of circumferential narrowing involving the colon beginning at the hepatic flexure which is displaced toward the left upper quadrant just prior to entering the hernia defect, and then again in the proximal and distal transverse colon were the colon enters and exits the hernia defect. These 3 segments are favored to be secondary to external compression related to the hernia, however an underlying colonic neoplasm is difficult to exclude entirely at the most proximal site which is outside the hernia defect. The patient's periumbilical  pain is likely due to distension of the ascending colon and cecum. Patient may be at risk for more significant colonic obstruction. Recommend surgical consultation. 3. Moderately large layering right pleural effusion. 4. Trace left pleural effusion. 5. Coronary artery calcifications. 6.  Aortic Atherosclerosis (ICD10-170.0). 7. Severe multilevel degenerative disc disease and facet arthropathy with exaggerated thoracic kyphosis. No compression fractures identified. 8. Pelvic floor laxity with downward migration of the small bowel into the pelvis posterior to the rectum. As the colon has been drawn up into the chest, it appears the small bowel has descended to fill the evacuated space. No evidence of small bowel wall thickening or obstruction. 9. Additional ancillary findings as above. Electronically Signed   By: Jacqulynn Cadet M.D.   On: 02/21/2018 08:47   Ct Abdomen Pelvis W Contrast  Result Date: 02/21/2018 CLINICAL DATA:  84 year old female with periumbilical abdominal pain for the past 24 hours and nausea but no vomiting. Extensive medical history including coronary artery disease, congestive heart failure, hypertension, atrial flutter and prior colitis. EXAM: CT ANGIOGRAPHY CHEST CT ABDOMEN AND PELVIS WITH CONTRAST TECHNIQUE: Multidetector CT imaging of the chest was performed using the standard protocol during bolus administration of intravenous contrast. Multiplanar CT image reconstructions and MIPs were obtained to evaluate the vascular anatomy. Multidetector CT imaging of the abdomen and pelvis was performed using the standard protocol during bolus administration of intravenous contrast. CONTRAST:  159mL ISOVUE-370 IOPAMIDOL (ISOVUE-370) INJECTION 76% COMPARISON:  Most recent prior CT scan of the abdomen and pelvis 11/27/2016; prior PET-CT including chest CT 02/21/2015 FINDINGS: CTA CHEST FINDINGS Cardiovascular: Adequate opacification of the pulmonary arteries to the proximal segmental level. No  evidence of filling defect to suggest acute pulmonary embolus. Marked mass effect on the heart secondary to the massive hiatal hernia. Calcification present in the mitral valve annulus. CT atherosclerotic calcifications noted along the coronary arteries. No pericardial effusion. Mediastinum/Nodes: Progressive massive hiatal hernia containing the entire stomach and  a large segment of colon. There has been substantial worsening compared to 11/27/2016. Lungs/Pleura: Moderately large layering right pleural effusion. Trace left pleural effusion. Marked atelectasis of both lower lobe secondary to a combination of pleural effusion and mass effect from the hiatal hernia. Musculoskeletal: Heavily exaggerated thoracic kyphosis. Multilevel degenerative disc disease and facet arthropathy. No acute fracture, malalignment or evidence of lytic or blastic osseous lesion. Review of the MIP images confirms the above findings. CT ABDOMEN and PELVIS FINDINGS Hepatobiliary: Punctate calcification in the right liver, unchanged and likely sequelae of old granulomatous disease. No suspicious liver lesion. The gallbladder is surgically absent. Similar degree of relatively severe intra and extrahepatic biliary ductal dilatation compared to prior imaging from 11/27/2016. Common bile duct again measures 14 mm. No definite distal mass or filling defect. Pancreas: No definite pancreatic mass or inflammatory changes. Anatomy is distorted by upward migration of the stomach and transverse colon. Spleen: Similar appearance of the spleen with innumerable low-attenuation lesions scattered throughout. Lesions are not substantially changed in size, or configuration compared to prior imaging from 2018 and 2016. These lesions demonstrated no evidence of hypermetabolic activity on prior PET-CT and are considered benign. Adrenals/Urinary Tract: Stable 3 cm left adrenal adenoma. The right adrenal gland is unremarkable. Mild renal cortical thinning  bilaterally with small areas of renal scarring versus tiny cyst formation. No hydronephrosis, nephrolithiasis or enhancing renal mass. Stomach/Bowel: Herniation of a large segments of the transverse colon into the large hiatal hernia. There is an approximately 3.8 cm segment of circumferential narrowing in the region of the displaced hepatic flexure which is nonspecific. Additionally, there is a second region of focal mild wall thickening and narrowing measuring approximately 4.6 cm in the proximal transverse colon where the colon passes through the hiatal hernia defect. There is a third region of colonic narrowing were the more distal transverse colon exits the hiatal hernia just proximal to the splenic flexure. The cecum and ascending colon are mildly distended proximal to these 2 sites and low-grade obstruction is not excluded. Vascular/Lymphatic: Atherosclerotic calcifications throughout the highly tortuous abdominal aorta. No evidence of dissection or aneurysm. No suspicious lymphadenopathy. Reproductive: Surgical changes of hysterectomy. Significant pelvic floor laxity with descent of small bowel into the region of laxity. Other: Reticulation of the subcutaneous fat diffusely consistent with body wall edema or anasarca. No peritoneal ascites. Musculoskeletal: Severe multilevel degenerative disc disease and facet arthropathy. No definite acute compression fracture. No lytic or blastic osseous lesion. Review of the MIP images confirms the above findings. IMPRESSION: 1. Negative for acute pulmonary embolus. 2. There has been significant interval progression of a now massive hiatal hernia compared to prior imaging from 2018 and 2016. The entire stomach and the majority of the transverse colon are now intrathoracic and there is imaging evidence (diffuse mild dilatation of the cecum and ascending colon) concerning for low grade or partial colonic obstruction. No evidence of colonic wall thickening, inflammation or  perforation at this time. There are 3 areas of circumferential narrowing involving the colon beginning at the hepatic flexure which is displaced toward the left upper quadrant just prior to entering the hernia defect, and then again in the proximal and distal transverse colon were the colon enters and exits the hernia defect. These 3 segments are favored to be secondary to external compression related to the hernia, however an underlying colonic neoplasm is difficult to exclude entirely at the most proximal site which is outside the hernia defect. The patient's periumbilical pain is likely due to  distension of the ascending colon and cecum. Patient may be at risk for more significant colonic obstruction. Recommend surgical consultation. 3. Moderately large layering right pleural effusion. 4. Trace left pleural effusion. 5. Coronary artery calcifications. 6.  Aortic Atherosclerosis (ICD10-170.0). 7. Severe multilevel degenerative disc disease and facet arthropathy with exaggerated thoracic kyphosis. No compression fractures identified. 8. Pelvic floor laxity with downward migration of the small bowel into the pelvis posterior to the rectum. As the colon has been drawn up into the chest, it appears the small bowel has descended to fill the evacuated space. No evidence of small bowel wall thickening or obstruction. 9. Additional ancillary findings as above. Electronically Signed   By: Jacqulynn Cadet M.D.   On: 02/21/2018 08:47     A/P: 84 year old woman, multiple significant comorbidities, noted to have a very large paraesophageal hernia containing stomach and transverse colon, concern for partial obstruction noted on the CT scan although patient continues to have normal bowel movements. She is also noted to have pleural effusions, possible pneumonia, any urinary tract infection.  No indication for urgent surgical intervention. Would recommend bowel rest, continued observation, followed by a bowel regimen and  slowly advancing diet. I discussed with her daughter at the bedside that should surgery be required for this it would be very high risk for this patient to have a perioperative morbidity or mortality given her frailty and other comorbidities as well as her age and the size of the paraesophageal hernia. At the least would require reduction of hernia contents and gastropexy, but at the most may require attempted closure of the hernia defect, colon resection and colostomy with gastrostomy for gastropexy. if symptoms improve without intervention, surgery is best avoided.   Romana Juniper, MD [facility], [place] Pager [phone]

## 2018-02-21 NOTE — ED Notes (Signed)
No urine culture swabs available, will send new urine cup to lab for them to run a culture when able.

## 2018-02-21 NOTE — ED Provider Notes (Signed)
[facility] EMERGENCY DEPARTMENT Provider Note   CSN: [id number] Arrival date & time: 02/20/18  2017     History   Chief Complaint Chief Complaint  Patient presents with  . Abdominal Pain    HPI Cathy [name] is a 84 y.o. female.  HPI  This is an 84 year old female with a history of coronary artery disease, CHF, colitis, hypertension, atrial flutter who presents with abdominal pain.  Per her family she has complained of periumbilical abdominal pain after eating over the last 24 hours.  Patient does not provide much history.  She currently denies any pain.  Reports nausea without vomiting.  Reports normal stools.  No diarrhea or bloody stools.  No fevers.  Family states that when they talk to her earlier she sounded very winded and short of breath.  She denied any chest pain at that time.  She does have a history of heart failure and has noted some lower extremity edema.  Past Medical History:  Diagnosis Date  . Allergic rhinitis   . Anemia   . Anxiety   . Chronic systolic CHF (congestive heart failure) ([place])    a. 2D Echo 02/02/15: EF 25-30%, akinesis of mid-apical anteroseptal myocardium, grade 3 DD, mod MR, severely dilated LA, mildly reduced RV systoluc function, mod dilated RA, mod TR, mildly increased PASP 58mmHg. b. 10/2016: EF 55-60% with no regional WMA. Grade 3 DD.   [name] Colitis    lymphocytic colitis feb 2011  . Coronary artery calcification    a. By CT 01/2015.  . Diverticulosis of colon   . DJD (degenerative joint disease)    gets epidural injections  . Dysphagia   . Hiatal hernia   . Hypercholesteremia   . Hypertension   . Iron deficiency   . Irritable bowel syndrome   . Liver lesion 02/18/2015  . Lumbar back pain   . Osteoporosis   . Paroxysmal atrial flutter ([place])    a. Dx 01/2015 - underwent DCCV 03/2015; takes Eliquis  . Sinus bradycardia   . Splenic lesion 02/18/2015  . Urinary incontinence   . Venous insufficiency   . Vitamin B12  deficiency     Patient Active Problem List   Diagnosis Date Noted  . Osteoarthritis of left shoulder 08/10/2017  . Pain in joint of left shoulder 08/10/2017  . Mass of joint of left wrist 06/22/2017  . Arthritis of left wrist 06/21/2017  . Carpal tunnel syndrome 06/21/2017  . Tenosynovitis of left wrist 06/21/2017  . Fracture of distal end of humerus 03/25/2017  . Closed supracondylar fracture of left humerus 01/15/2017  . Gait disorder 01/15/2017  . Recurrent falls 01/15/2017  . Rib fractures 11/28/2016  . CHF exacerbation ([place]) 11/28/2016  . Diarrhea 11/27/2016  . Chest pain 11/18/2016  . Skin lesion 09/30/2016  . Ganglion cyst of dorsum of left wrist 09/30/2016  . Itching 09/30/2016  . Acute upper respiratory infection 09/30/2016  . Rotator cuff arthropathy, right 04/26/2016  . Hyperglycemia 04/01/2016  . Right arm pain 04/01/2016  . Wrist swelling 09/30/2015  . Acute URI 06/04/2015  . Preventative health care 04/02/2015  . UTI (urinary tract infection) 04/02/2015  . Chronic combined systolic and diastolic CHF (congestive heart failure) ([place]) 03/17/2015  . Coronary artery calcification seen on CT scan 03/17/2015  . Sinus bradycardia 03/17/2015  . Splenic lesion 02/18/2015  . Liver lesion 02/18/2015  . CAD (coronary artery disease), native coronary artery 02/03/2015  . Atrial flutter ([place]) 02/02/2015  . Fatigue  01/31/2015  . Abnormal CT of the abdomen 01/31/2015  . Prolonged Q-T interval on ECG 01/31/2015  . Hepatic lesion   . Constipation 12/03/2014  . Lower extremity edema 04/25/2014  . Peripheral neuropathy ([place]) 12/10/2013  . Overactive bladder 12/27/2011  . Osteoporosis 12/27/2011  . VITAMIN B12 DEFICIENCY 05/03/2010  . IRON DEFICIENCY 05/03/2010  . Anemia 05/03/2010  . CLOSTRIDIUM DIFFICILE COLITIS 05/26/2009  . VENOUS INSUFFICIENCY 01/27/2009  . BACK PAIN, LUMBAR 01/27/2008  . URINARY INCONTINENCE 08/23/2007  . DIVERTICULOSIS OF COLON 04/10/2007  .  HYPERCHOLESTEROLEMIA 02/13/2007  . ANXIETY 02/13/2007  . Essential hypertension 02/13/2007  . HIATAL HERNIA 02/13/2007  . Irritable bowel syndrome 02/13/2007  . DEGENERATIVE JOINT DISEASE 02/13/2007  . Allergic rhinitis 02/13/2007    Past Surgical History:  Procedure Laterality Date  . ABDOMINAL HYSTERECTOMY    . CARDIOVERSION N/A 02/03/2015   Procedure: CARDIOVERSION;  Surgeon: Sueanne Margarita, MD;  Location: [facility] ENDOSCOPY;  Service: Cardiovascular;  Laterality: N/A;  . CARDIOVERSION N/A 03/10/2015   Procedure: CARDIOVERSION;  Surgeon: Dorothy Spark, MD;  Location: [place];  Service: Cardiovascular;  Laterality: N/A;  . CARDIOVERSION N/A 01/07/2017   Procedure: CARDIOVERSION;  Surgeon: Pixie Casino, MD;  Location: [facility] ENDOSCOPY;  Service: Cardiovascular;  Laterality: N/A;  . cataract surgery    . decompressive lumbar laminectomy  06/2008   at L2-3, L3-4 and L4-5 by Dr. Ronnald Ramp  . LAPAROSCOPIC CHOLECYSTECTOMY  04/2009   Dr. Abran Cantor  . REPLACEMENT TOTAL KNEE    . TEE WITHOUT CARDIOVERSION N/A 02/03/2015   Procedure: TRANSESOPHAGEAL ECHOCARDIOGRAM (TEE);  Surgeon: Sueanne Margarita, MD;  Location: [facility] ENDOSCOPY;  Service: Cardiovascular;  Laterality: N/A;     OB History   No obstetric history on file.      Home Medications    Prior to Admission medications   Medication Sig Start Date End Date Taking? Authorizing Provider  amiodarone (PACERONE) 200 MG tablet TAKE 1 TABLET DAILY Patient taking differently: Take 200 mg by mouth daily.  01/12/18  Yes Camnitz, Will Hassell Done, MD  atorvastatin (LIPITOR) 20 MG tablet TAKE 1 TABLET DAILY AT 6 P.M. Patient taking differently: Take 20 mg by mouth daily at 6 PM.  06/28/17  Yes Biagio Borg, MD  calcium-vitamin D (OSCAL WITH D) 500-200 MG-UNIT per tablet Take 1 tablet by mouth daily.    Yes [provider]  cholecalciferol (VITAMIN D) 1000 UNITS tablet Take 1,000 Units by mouth daily.   Yes [provider]  diclofenac  (FLECTOR) 1.3 % PTCH Place 1 patch onto the skin daily.   Yes [provider]  ELIQUIS 2.5 MG TABS tablet TAKE 1 TABLET TWICE A DAY Patient taking differently: Take 2.5 mg by mouth 2 (two) times daily.  08/29/17  Yes Camnitz, Will Hassell Done, MD  esomeprazole (NEXIUM) 40 MG capsule TAKE 1 CAPSULE DAILY Patient taking differently: Take 40 mg by mouth daily.  06/20/17  Yes Biagio Borg, MD  furosemide (LASIX) 20 MG tablet Take 1 tablet (20 mg total) by mouth every other day. 02/11/17 02/21/18 Yes Camnitz, Will Hassell Done, MD  isosorbide mononitrate (IMDUR) 30 MG 24 hr tablet TAKE 1 TABLET(30 MG) BY MOUTH DAILY Patient taking differently: Take 30 mg by mouth daily.  01/10/18  Yes Camnitz, Will Hassell Done, MD  levothyroxine (SYNTHROID, LEVOTHROID) 50 MCG tablet Take 1 tablet (50 mcg total) by mouth daily. 10/10/17  Yes Biagio Borg, MD  polyethylene glycol [facility] / Floria Raveling) packet Take 17 g by mouth 2 (two) times  daily. Patient taking differently: Take 17 g by mouth daily as needed for mild constipation.  10/22/14  Yes Palumbo, April, MD  pregabalin (LYRICA) 50 MG capsule Take 1 capsule (50 mg total) by mouth 3 (three) times daily. 09/08/17  Yes Biagio Borg, MD  senna-docusate (SENOKOT-S) 8.6-50 MG tablet Take 1 tablet by mouth at bedtime. Patient taking differently: Take 1 tablet by mouth at bedtime as needed for mild constipation.  12/03/16  Yes Florencia Reasons, MD  solifenacin (VESICARE) 10 MG tablet Take 1 tablet (10 mg total) by mouth daily. 06/17/14  Yes Rowe Clack, MD  vitamin B-12 (CYANOCOBALAMIN) 1000 MCG tablet Take 1 tablet (1,000 mcg total) by mouth daily. 06/17/14  Yes Rowe Clack, MD  furosemide (LASIX) 20 MG tablet TAKE 1 TABLET DAILY Patient not taking: Reported on 02/21/2018 01/09/18   Biagio Borg, MD    Family History Family History  Problem Relation Age of Onset  . Cancer Brother        lung  . Diabetes Son   . Coronary artery disease Son   . Kidney disease Son   .  Stroke Son   . Heart attack Mother   . Cancer Brother        colon  . Cancer Daughter        cervical, lung   . Heart attack Brother   . Diabetes Brother   . Heart attack Sister   . Hypertension Neg Hx   . Fainting Neg Hx     Social History Social History   Tobacco Use  . Smoking status: Former Smoker    Packs/day: 0.50    Years: 10.00    Pack years: 5.00    Types: Cigarettes  . Smokeless tobacco: Never Used  Substance Use Topics  . Alcohol use: No  . Drug use: Never     Allergies   Amoxicillin-pot clavulanate and Sulfonamide derivatives   Review of Systems Review of Systems  Constitutional: Negative for fever.  Respiratory: Positive for shortness of breath. Negative for cough.   Cardiovascular: Positive for leg swelling. Negative for chest pain.  Gastrointestinal: Positive for abdominal pain and nausea. Negative for diarrhea and vomiting.  Genitourinary: Negative for dysuria.  All other systems reviewed and are negative.    Physical Exam Updated Vital Signs BP (!) 142/63   Pulse (!) 43   Temp 98.3 F (36.8 C) (Oral)   Resp 15   Ht 1.499 m (4\' 11" )   Wt 50.8 kg   SpO2 96%   BMI 22.62 kg/m   Physical Exam Vitals signs and nursing note reviewed.  Constitutional:      Comments: Elderly, nontoxic-appearing  HENT:     Head: Normocephalic and atraumatic.  Eyes:     Pupils: Pupils are equal, round, and reactive to light.  Neck:     Musculoskeletal: Neck supple.  Cardiovascular:     Rate and Rhythm: Normal rate and regular rhythm.     Heart sounds: Normal heart sounds.  Pulmonary:     Effort: Pulmonary effort is normal. No respiratory distress.     Breath sounds: No wheezing.     Comments: No crackles noted Abdominal:     General: Bowel sounds are normal.     Palpations: Abdomen is soft.     Tenderness: There is no abdominal tenderness. There is no guarding or rebound.  Musculoskeletal:     Right lower leg: Edema present.     Left lower leg:  Edema  present.     Comments: 1+ pitting edema bilateral lower extremities  Skin:    General: Skin is warm and dry.  Neurological:     Mental Status: She is oriented to person, place, and time.      ED Treatments / Results  Labs (all labs ordered are listed, but only abnormal results are displayed) Labs Reviewed  LIPASE, BLOOD - Abnormal; Notable for the following components:      Result Value   Lipase 73 (*)    All other components within normal limits  COMPREHENSIVE METABOLIC PANEL - Abnormal; Notable for the following components:   Potassium 3.4 (*)    Glucose, Bld 126 (*)    Total Protein 5.5 (*)    Albumin 3.1 (*)    GFR calc non Af Amer 57 (*)    All other components within normal limits  CBC - Abnormal; Notable for the following components:   WBC 11.3 (*)    Hemoglobin 11.7 (*)    All other components within normal limits  URINALYSIS, ROUTINE W REFLEX MICROSCOPIC - Abnormal; Notable for the following components:   APPearance CLOUDY (*)    Hgb urine dipstick SMALL (*)    Leukocytes, UA LARGE (*)    WBC, UA >50 (*)    Bacteria, UA FEW (*)    All other components within normal limits  URINE CULTURE  TROPONIN I    EKG EKG Interpretation  Date/Time:  Tuesday February 21 2018 05:10:34 EST Ventricular Rate:  41 PR Interval:    QRS Duration: 156 QT Interval:  555 QTC Calculation: 459 R Axis:   -87 Text Interpretation:  Sinus bradycardia Left bundle branch block Probable left atrial enlargement Confirmed by Thayer Jew [phone]) on 02/21/2018 5:15:53 AM   Radiology Dg Chest 2 View  Result Date: 02/21/2018 CLINICAL DATA:  Acute onset of intermittent right lower quadrant abdominal pain and generalized chest pain. Shortness of breath. EXAM: CHEST - 2 VIEW COMPARISON:  Chest radiograph performed 12/01/2016 FINDINGS: The lungs are well-aerated. Small bilateral pleural effusions are noted. Bibasilar airspace opacities may reflect atelectasis or pneumonia. There is  no evidence of pneumothorax. The heart is borderline normal in size. No acute osseous abnormalities are seen. The patient's very large paraesophageal hernia is again noted. Mild degenerative change is noted at the glenohumeral joints bilaterally. IMPRESSION: 1. Small bilateral pleural effusions. Bibasilar airspace opacities may reflect atelectasis or pneumonia. 2. Very large paraesophageal hernia again noted. Electronically Signed   By: Garald Balding M.D.   On: 02/21/2018 05:28    Procedures Procedures (including critical care time)  Medications Ordered in ED Medications - No data to display   Initial Impression / Assessment and Plan / ED Course  I have reviewed the triage vital signs and the nursing notes.  Pertinent labs & imaging results that were available during my care of the patient were reviewed by me and considered in my medical decision making (see chart for details).     Patient presents with abdominal pain.  She does not provide much history family reports that she complained of pain after eating and was notably short of breath.  She is overall nontoxic and vital signs are reassuring.  No significant tenderness on exam.  Lab work notable for mild leukocytosis and a lipase of 73.  Denies fevers or other infectious symptoms.  Urinalysis shows a likely infection.  I have added an EKG, troponin, chest x-ray and a CT scan of the abdomen given her nonspecific symptoms.  Urine culture was added as well.  Chest x-ray shows a paraesophageal hernia and possible pneumonia.  She has no pneumonia symptoms.  EKG is unchanged from prior and troponin is negative.  CT scan is pending.  Patient signed out pending CT scan.  Will treat urinary tract infection if CT scan is otherwise unremarkable.  Final Clinical Impressions(s) / ED Diagnoses   Final diagnoses:  None    ED Discharge Orders    None       Horton, Barbette Hair, MD 02/21/18 [phone]

## 2018-02-21 NOTE — ED Notes (Signed)
Urine cup sent down to lab for lab to do a urine culture with since no tubes are available on the unit.

## 2018-02-21 NOTE — Progress Notes (Signed)
New Admission Note: Patient admitted to room 5M22 from the [facility]  Arrival Method: via stretcher Mental Orientation:  Telemetry: N/A Assessment: Completed Skin: Intact IV: NSL Pain: denies Tubes: None Safety Measures: Safety Fall Prevention Plan has been discussed  Admission: to be completed 5 [place] Orientation: Patient has been orientated to the room, unit and staff.   Family: Daughter in law is at the bedside   Orders to be reviewed and implemented. Will continue to monitor the patient. Call light has been placed within reach and bed alarm has been activated.   Mady Gemma, BSN, RN-BC Phone: [phone]

## 2018-02-21 NOTE — H&P (Addendum)
History and Physical:    Cathy [name]   ZOX:[id number] DOB: Feb 07, 1928 DOA: 02/20/2018  Referring MD/provider: Shirlyn Goltz, MD PCP: Biagio Borg, MD   Patient coming from: Home  Chief Complaint: Abdominal pain  History of Present Illness:   Cathy [name] is an 84 y.o. female  with a PMH of multiple medical problems including known hiatal hernia, chronic systolic CHF with an EF of 25-30% in 2016, but improved to 55-60% in 2018, hypertension, hyperlipidemia and diverticulosis who presents with a 24-hour history of abdominal pain associated with nausea who was brought to the ED for evaluation by her family who states she has been "gray looking "for the past 2-3 days and has not had an appetite.  Family reports that she has been complaining of abdominal pain but that she moved her bowels normally 2 days ago.  There has not been any associated vomiting.  The patient is very slow to respond and is unable to provide much additional history.  ED Course: Blood pressure 164/65, pulse 40, respiratory rate 14, O2 saturation 96% on room air.  Labs significant for a potassium of 3.4, BUN 14, creatinine 0.90, hemoglobin 11.7, white blood cell count 11.3.  Underwent CT scan of the abdomen and pelvis which showed a hiatal hernia and a partial colonic obstruction.  The ED physician consulted with general surgery as well.   ROS:   Review of Systems  Constitutional: Positive for malaise/fatigue.  HENT: Negative.   Eyes: Negative.   Respiratory: Negative.   Cardiovascular: Negative.   Gastrointestinal: Positive for abdominal pain and nausea. Negative for blood in stool, melena and vomiting.  Genitourinary: Negative.   Musculoskeletal: Negative for falls and joint pain.       Ambulates with a walker, "very slow"  Neurological: Positive for weakness.  Psychiatric/Behavioral: The patient is nervous/anxious.     Past Medical History:   Past Medical History:  Diagnosis Date  . Allergic rhinitis     . Anemia   . Anxiety   . Chronic systolic CHF (congestive heart failure) ([place])    a. 2D Echo 02/02/15: EF 25-30%, akinesis of mid-apical anteroseptal myocardium, grade 3 DD, mod MR, severely dilated LA, mildly reduced RV systoluc function, mod dilated RA, mod TR, mildly increased PASP 32mmHg. b. 10/2016: EF 55-60% with no regional WMA. Grade 3 DD.   [name] Colitis    lymphocytic colitis feb 2011  . Coronary artery calcification    a. By CT 01/2015.  . Diverticulosis of colon   . DJD (degenerative joint disease)    gets epidural injections  . Dysphagia   . Hiatal hernia   . Hypercholesteremia   . Hypertension   . Iron deficiency   . Irritable bowel syndrome   . Liver lesion 02/18/2015  . Lumbar back pain   . Osteoporosis   . Paroxysmal atrial flutter ([place])    a. Dx 01/2015 - underwent DCCV 03/2015; takes Eliquis  . Sinus bradycardia   . Splenic lesion 02/18/2015  . Urinary incontinence   . Venous insufficiency   . Vitamin B12 deficiency     Past Surgical History:   Past Surgical History:  Procedure Laterality Date  . ABDOMINAL HYSTERECTOMY    . CARDIOVERSION N/A 02/03/2015   Procedure: CARDIOVERSION;  Surgeon: Sueanne Margarita, MD;  Location: [facility] ENDOSCOPY;  Service: Cardiovascular;  Laterality: N/A;  . CARDIOVERSION N/A 03/10/2015   Procedure: CARDIOVERSION;  Surgeon: Dorothy Spark, MD;  Location: [place];  Service: Cardiovascular;  Laterality: N/A;  . CARDIOVERSION N/A 01/07/2017   Procedure: CARDIOVERSION;  Surgeon: Pixie Casino, MD;  Location: [facility] ENDOSCOPY;  Service: Cardiovascular;  Laterality: N/A;  . cataract surgery    . decompressive lumbar laminectomy  06/2008   at L2-3, L3-4 and L4-5 by Dr. Ronnald Ramp  . LAPAROSCOPIC CHOLECYSTECTOMY  04/2009   Dr. Abran Cantor  . REPLACEMENT TOTAL KNEE    . TEE WITHOUT CARDIOVERSION N/A 02/03/2015   Procedure: TRANSESOPHAGEAL ECHOCARDIOGRAM (TEE);  Surgeon: Sueanne Margarita, MD;  Location: [facility] ENDOSCOPY;  Service: Cardiovascular;   Laterality: N/A;    Social History:   Social History   Socioeconomic History  . Marital status: Widowed    Spouse name: Not on file  . Number of children: Not on file  . Years of education: Not on file  . Highest education level: Not on file  Occupational History  . Occupation: retired  Scientific laboratory technician  . Financial resource strain: Not on file  . Food insecurity:    Worry: Not on file    Inability: Not on file  . Transportation needs:    Medical: Not on file    Non-medical: Not on file  Tobacco Use  . Smoking status: Former Smoker    Packs/day: 0.50    Years: 10.00    Pack years: 5.00    Types: Cigarettes  . Smokeless tobacco: Never Used  Substance and Sexual Activity  . Alcohol use: No  . Drug use: Never  . Sexual activity: Not on file  Lifestyle  . Physical activity:    Days per week: Not on file    Minutes per session: Not on file  . Stress: Not on file  Relationships  . Social connections:    Talks on phone: Not on file    Gets together: Not on file    Attends religious service: Not on file    Active member of club or organization: Not on file    Attends meetings of clubs or organizations: Not on file    Relationship status: Not on file  . Intimate partner violence:    Fear of current or ex partner: Not on file    Emotionally abused: Not on file    Physically abused: Not on file    Forced sexual activity: Not on file  Other Topics Concern  . Not on file  Social History Narrative  . Not on file    Allergies   Amoxicillin-pot clavulanate and Sulfonamide derivatives  Family history:   Family History  Problem Relation Age of Onset  . Cancer Brother        lung  . Diabetes Son   . Coronary artery disease Son   . Kidney disease Son   . Stroke Son   . Heart attack Mother   . Cancer Brother        colon  . Cancer Daughter        cervical, lung   . Heart attack Brother   . Diabetes Brother   . Heart attack Sister   . Hypertension Neg Hx   .  Fainting Neg Hx     Current Medications:   Prior to Admission medications   Medication Sig Start Date End Date Taking? Authorizing Provider  amiodarone (PACERONE) 200 MG tablet TAKE 1 TABLET DAILY Patient taking differently: Take 200 mg by mouth daily.  01/12/18  Yes Camnitz, Will Hassell Done, MD  atorvastatin (LIPITOR) 20 MG tablet TAKE 1 TABLET DAILY AT 6 P.M. Patient taking differently: Take  20 mg by mouth daily at 6 PM.  06/28/17  Yes Biagio Borg, MD  calcium-vitamin D (OSCAL WITH D) 500-200 MG-UNIT per tablet Take 1 tablet by mouth daily.    Yes [provider]  cholecalciferol (VITAMIN D) 1000 UNITS tablet Take 1,000 Units by mouth daily.   Yes [provider]  diclofenac (FLECTOR) 1.3 % PTCH Place 1 patch onto the skin daily.   Yes [provider]  ELIQUIS 2.5 MG TABS tablet TAKE 1 TABLET TWICE A DAY Patient taking differently: Take 2.5 mg by mouth 2 (two) times daily.  08/29/17  Yes Camnitz, Will Hassell Done, MD  esomeprazole (NEXIUM) 40 MG capsule TAKE 1 CAPSULE DAILY Patient taking differently: Take 40 mg by mouth daily.  06/20/17  Yes Biagio Borg, MD  furosemide (LASIX) 20 MG tablet Take 1 tablet (20 mg total) by mouth every other day. 02/11/17 02/21/18 Yes Camnitz, Will Hassell Done, MD  isosorbide mononitrate (IMDUR) 30 MG 24 hr tablet TAKE 1 TABLET(30 MG) BY MOUTH DAILY Patient taking differently: Take 30 mg by mouth daily.  01/10/18  Yes Camnitz, Will Hassell Done, MD  levothyroxine (SYNTHROID, LEVOTHROID) 50 MCG tablet Take 1 tablet (50 mcg total) by mouth daily. 10/10/17  Yes Biagio Borg, MD  polyethylene glycol [facility] / Floria Raveling) packet Take 17 g by mouth 2 (two) times daily. Patient taking differently: Take 17 g by mouth daily as needed for mild constipation.  10/22/14  Yes Palumbo, April, MD  pregabalin (LYRICA) 50 MG capsule Take 1 capsule (50 mg total) by mouth 3 (three) times daily. 09/08/17  Yes Biagio Borg, MD  senna-docusate (SENOKOT-S) 8.6-50 MG tablet Take  1 tablet by mouth at bedtime. Patient taking differently: Take 1 tablet by mouth at bedtime as needed for mild constipation.  12/03/16  Yes Florencia Reasons, MD  solifenacin (VESICARE) 10 MG tablet Take 1 tablet (10 mg total) by mouth daily. 06/17/14  Yes Rowe Clack, MD  vitamin B-12 (CYANOCOBALAMIN) 1000 MCG tablet Take 1 tablet (1,000 mcg total) by mouth daily. 06/17/14  Yes Rowe Clack, MD  furosemide (LASIX) 20 MG tablet TAKE 1 TABLET DAILY Patient not taking: Reported on 02/21/2018 01/09/18   Biagio Borg, MD    Physical Exam:   Vitals:   02/21/18 0900 02/21/18 1000 02/21/18 1030 02/21/18 1129  BP: (!) 136/59 (!) 172/69 (!) 147/64 (!) 164/65  Pulse: (!) 41 (!) 41 (!) 43 (!) 40  Resp: 14 15 14 14   Temp:    98.8 F (37.1 C)  TempSrc:    Oral  SpO2: 92% 93% 94% 96%  Weight:      Height:         Physical Exam: Blood pressure (!) 164/65, pulse (!) 40, temperature 98.8 F (37.1 C), temperature source Oral, resp. rate 14, height 4\' 11"  (1.499 m), weight 50.8 kg, SpO2 96 %. Gen: Lethargic with open eyes but very slow to respond. Head: Normocephalic, atraumatic. Eyes: Pupils equal, round and reactive to light. Extraocular movements intact.  Sclerae nonicteric. No lid lag. Mouth: Oropharynx reveals mildly dry mucous membranes. Dentition is fair. Neck: Supple, no thyromegaly, no lymphadenopathy, no jugular venous distention. Chest: Lungs are diminished with poor effort.  No rales, rhonchi or wheezes.  CV: Heart sounds are regular with an S1, S2.  Bradycardic with a grade III/VI murmur, no rubs, clicks, or gallops.  Abdomen: Soft, denies tenderness to palpation, nondistended with decreased bowel sounds. No hepatosplenomegaly or palpable masses. Extremities: Extremities are without  clubbing, or cyanosis. No edema. Pedal pulses 2+.  Skin: Warm and dry. No rashes, lesions or wounds. Neuro: Very slow to respond; grossly nonfocal.  Psych: Insight is good and judgment is impaired.  Mood and affect flat.   Data Review:    Labs: Basic Metabolic Panel: Recent Labs  Lab 02/20/18 2146  NA 141  K 3.4*  CL 105  CO2 27  GLUCOSE 126*  BUN 14  CREATININE 0.90  CALCIUM 8.9   Liver Function Tests: Recent Labs  Lab 02/20/18 2146  AST 26  ALT 21  ALKPHOS 49  BILITOT 0.7  PROT 5.5*  ALBUMIN 3.1*   Recent Labs  Lab 02/20/18 2146  LIPASE 73*   No results for input(s): AMMONIA in the last 168 hours. CBC: Recent Labs  Lab 02/20/18 2146  WBC 11.3*  HGB 11.7*  HCT 36.9  MCV 93.4  PLT 167   Cardiac Enzymes: Recent Labs  Lab 02/21/18 0534  TROPONINI <0.03    Urinalysis    Component Value Date/Time   COLORURINE YELLOW 02/20/2018 2134   APPEARANCEUR CLOUDY (A) 02/20/2018 2134   LABSPEC 1.016 02/20/2018 2134   PHURINE 6.0 02/20/2018 2134   GLUCOSEU NEGATIVE 02/20/2018 2134   GLUCOSEU NEGATIVE 04/11/2017 1536   HGBUR SMALL (A) 02/20/2018 2134   BILIRUBINUR NEGATIVE 02/20/2018 2134   BILIRUBINUR neg 11/18/2016 1520   KETONESUR NEGATIVE 02/20/2018 2134   PROTEINUR NEGATIVE 02/20/2018 2134   UROBILINOGEN 0.2 04/11/2017 1536   NITRITE NEGATIVE 02/20/2018 2134   LEUKOCYTESUR LARGE (A) 02/20/2018 2134      Radiographic Studies: Dg Chest 2 View  Result Date: 02/21/2018 CLINICAL DATA:  Acute onset of intermittent right lower quadrant abdominal pain and generalized chest pain. Shortness of breath. EXAM: CHEST - 2 VIEW COMPARISON:  Chest radiograph performed 12/01/2016 FINDINGS: The lungs are well-aerated. Small bilateral pleural effusions are noted. Bibasilar airspace opacities may reflect atelectasis or pneumonia. There is no evidence of pneumothorax. The heart is borderline normal in size. No acute osseous abnormalities are seen. The patient's very large paraesophageal hernia is again noted. Mild degenerative change is noted at the glenohumeral joints bilaterally. IMPRESSION: 1. Small bilateral pleural effusions. Bibasilar airspace opacities may  reflect atelectasis or pneumonia. 2. Very large paraesophageal hernia again noted. Electronically Signed   By: Garald Balding M.D.   On: 02/21/2018 05:28   Ct Angio Chest Pe W And/or Wo Contrast  Result Date: 02/21/2018 CLINICAL DATA:  84 year old female with periumbilical abdominal pain for the past 24 hours and nausea but no vomiting. Extensive medical history including coronary artery disease, congestive heart failure, hypertension, atrial flutter and prior colitis. EXAM: CT ANGIOGRAPHY CHEST CT ABDOMEN AND PELVIS WITH CONTRAST TECHNIQUE: Multidetector CT imaging of the chest was performed using the standard protocol during bolus administration of intravenous contrast. Multiplanar CT image reconstructions and MIPs were obtained to evaluate the vascular anatomy. Multidetector CT imaging of the abdomen and pelvis was performed using the standard protocol during bolus administration of intravenous contrast. CONTRAST:  160mL ISOVUE-370 IOPAMIDOL (ISOVUE-370) INJECTION 76% COMPARISON:  Most recent prior CT scan of the abdomen and pelvis 11/27/2016; prior PET-CT including chest CT 02/21/2015 FINDINGS: CTA CHEST FINDINGS Cardiovascular: Adequate opacification of the pulmonary arteries to the proximal segmental level. No evidence of filling defect to suggest acute pulmonary embolus. Marked mass effect on the heart secondary to the massive hiatal hernia. Calcification present in the mitral valve annulus. CT atherosclerotic calcifications noted along the coronary arteries. No pericardial effusion. Mediastinum/Nodes: Progressive massive hiatal  hernia containing the entire stomach and a large segment of colon. There has been substantial worsening compared to 11/27/2016. Lungs/Pleura: Moderately large layering right pleural effusion. Trace left pleural effusion. Marked atelectasis of both lower lobe secondary to a combination of pleural effusion and mass effect from the hiatal hernia. Musculoskeletal: Heavily exaggerated  thoracic kyphosis. Multilevel degenerative disc disease and facet arthropathy. No acute fracture, malalignment or evidence of lytic or blastic osseous lesion. Review of the MIP images confirms the above findings. CT ABDOMEN and PELVIS FINDINGS Hepatobiliary: Punctate calcification in the right liver, unchanged and likely sequelae of old granulomatous disease. No suspicious liver lesion. The gallbladder is surgically absent. Similar degree of relatively severe intra and extrahepatic biliary ductal dilatation compared to prior imaging from 11/27/2016. Common bile duct again measures 14 mm. No definite distal mass or filling defect. Pancreas: No definite pancreatic mass or inflammatory changes. Anatomy is distorted by upward migration of the stomach and transverse colon. Spleen: Similar appearance of the spleen with innumerable low-attenuation lesions scattered throughout. Lesions are not substantially changed in size, or configuration compared to prior imaging from 2018 and 2016. These lesions demonstrated no evidence of hypermetabolic activity on prior PET-CT and are considered benign. Adrenals/Urinary Tract: Stable 3 cm left adrenal adenoma. The right adrenal gland is unremarkable. Mild renal cortical thinning bilaterally with small areas of renal scarring versus tiny cyst formation. No hydronephrosis, nephrolithiasis or enhancing renal mass. Stomach/Bowel: Herniation of a large segments of the transverse colon into the large hiatal hernia. There is an approximately 3.8 cm segment of circumferential narrowing in the region of the displaced hepatic flexure which is nonspecific. Additionally, there is a second region of focal mild wall thickening and narrowing measuring approximately 4.6 cm in the proximal transverse colon where the colon passes through the hiatal hernia defect. There is a third region of colonic narrowing were the more distal transverse colon exits the hiatal hernia just proximal to the splenic  flexure. The cecum and ascending colon are mildly distended proximal to these 2 sites and low-grade obstruction is not excluded. Vascular/Lymphatic: Atherosclerotic calcifications throughout the highly tortuous abdominal aorta. No evidence of dissection or aneurysm. No suspicious lymphadenopathy. Reproductive: Surgical changes of hysterectomy. Significant pelvic floor laxity with descent of small bowel into the region of laxity. Other: Reticulation of the subcutaneous fat diffusely consistent with body wall edema or anasarca. No peritoneal ascites. Musculoskeletal: Severe multilevel degenerative disc disease and facet arthropathy. No definite acute compression fracture. No lytic or blastic osseous lesion. Review of the MIP images confirms the above findings. IMPRESSION: 1. Negative for acute pulmonary embolus. 2. There has been significant interval progression of a now massive hiatal hernia compared to prior imaging from 2018 and 2016. The entire stomach and the majority of the transverse colon are now intrathoracic and there is imaging evidence (diffuse mild dilatation of the cecum and ascending colon) concerning for low grade or partial colonic obstruction. No evidence of colonic wall thickening, inflammation or perforation at this time. There are 3 areas of circumferential narrowing involving the colon beginning at the hepatic flexure which is displaced toward the left upper quadrant just prior to entering the hernia defect, and then again in the proximal and distal transverse colon were the colon enters and exits the hernia defect. These 3 segments are favored to be secondary to external compression related to the hernia, however an underlying colonic neoplasm is difficult to exclude entirely at the most proximal site which is outside the hernia defect. The patient's  periumbilical pain is likely due to distension of the ascending colon and cecum. Patient may be at risk for more significant colonic obstruction.  Recommend surgical consultation. 3. Moderately large layering right pleural effusion. 4. Trace left pleural effusion. 5. Coronary artery calcifications. 6.  Aortic Atherosclerosis (ICD10-170.0). 7. Severe multilevel degenerative disc disease and facet arthropathy with exaggerated thoracic kyphosis. No compression fractures identified. 8. Pelvic floor laxity with downward migration of the small bowel into the pelvis posterior to the rectum. As the colon has been drawn up into the chest, it appears the small bowel has descended to fill the evacuated space. No evidence of small bowel wall thickening or obstruction. 9. Additional ancillary findings as above. Electronically Signed   By: Jacqulynn Cadet M.D.   On: 02/21/2018 08:47   Ct Abdomen Pelvis W Contrast  Result Date: 02/21/2018 CLINICAL DATA:  84 year old female with periumbilical abdominal pain for the past 24 hours and nausea but no vomiting. Extensive medical history including coronary artery disease, congestive heart failure, hypertension, atrial flutter and prior colitis. EXAM: CT ANGIOGRAPHY CHEST CT ABDOMEN AND PELVIS WITH CONTRAST TECHNIQUE: Multidetector CT imaging of the chest was performed using the standard protocol during bolus administration of intravenous contrast. Multiplanar CT image reconstructions and MIPs were obtained to evaluate the vascular anatomy. Multidetector CT imaging of the abdomen and pelvis was performed using the standard protocol during bolus administration of intravenous contrast. CONTRAST:  141mL ISOVUE-370 IOPAMIDOL (ISOVUE-370) INJECTION 76% COMPARISON:  Most recent prior CT scan of the abdomen and pelvis 11/27/2016; prior PET-CT including chest CT 02/21/2015 FINDINGS: CTA CHEST FINDINGS Cardiovascular: Adequate opacification of the pulmonary arteries to the proximal segmental level. No evidence of filling defect to suggest acute pulmonary embolus. Marked mass effect on the heart secondary to the massive hiatal hernia.  Calcification present in the mitral valve annulus. CT atherosclerotic calcifications noted along the coronary arteries. No pericardial effusion. Mediastinum/Nodes: Progressive massive hiatal hernia containing the entire stomach and a large segment of colon. There has been substantial worsening compared to 11/27/2016. Lungs/Pleura: Moderately large layering right pleural effusion. Trace left pleural effusion. Marked atelectasis of both lower lobe secondary to a combination of pleural effusion and mass effect from the hiatal hernia. Musculoskeletal: Heavily exaggerated thoracic kyphosis. Multilevel degenerative disc disease and facet arthropathy. No acute fracture, malalignment or evidence of lytic or blastic osseous lesion. Review of the MIP images confirms the above findings. CT ABDOMEN and PELVIS FINDINGS Hepatobiliary: Punctate calcification in the right liver, unchanged and likely sequelae of old granulomatous disease. No suspicious liver lesion. The gallbladder is surgically absent. Similar degree of relatively severe intra and extrahepatic biliary ductal dilatation compared to prior imaging from 11/27/2016. Common bile duct again measures 14 mm. No definite distal mass or filling defect. Pancreas: No definite pancreatic mass or inflammatory changes. Anatomy is distorted by upward migration of the stomach and transverse colon. Spleen: Similar appearance of the spleen with innumerable low-attenuation lesions scattered throughout. Lesions are not substantially changed in size, or configuration compared to prior imaging from 2018 and 2016. These lesions demonstrated no evidence of hypermetabolic activity on prior PET-CT and are considered benign. Adrenals/Urinary Tract: Stable 3 cm left adrenal adenoma. The right adrenal gland is unremarkable. Mild renal cortical thinning bilaterally with small areas of renal scarring versus tiny cyst formation. No hydronephrosis, nephrolithiasis or enhancing renal mass.  Stomach/Bowel: Herniation of a large segments of the transverse colon into the large hiatal hernia. There is an approximately 3.8 cm segment of circumferential narrowing in  the region of the displaced hepatic flexure which is nonspecific. Additionally, there is a second region of focal mild wall thickening and narrowing measuring approximately 4.6 cm in the proximal transverse colon where the colon passes through the hiatal hernia defect. There is a third region of colonic narrowing were the more distal transverse colon exits the hiatal hernia just proximal to the splenic flexure. The cecum and ascending colon are mildly distended proximal to these 2 sites and low-grade obstruction is not excluded. Vascular/Lymphatic: Atherosclerotic calcifications throughout the highly tortuous abdominal aorta. No evidence of dissection or aneurysm. No suspicious lymphadenopathy. Reproductive: Surgical changes of hysterectomy. Significant pelvic floor laxity with descent of small bowel into the region of laxity. Other: Reticulation of the subcutaneous fat diffusely consistent with body wall edema or anasarca. No peritoneal ascites. Musculoskeletal: Severe multilevel degenerative disc disease and facet arthropathy. No definite acute compression fracture. No lytic or blastic osseous lesion. Review of the MIP images confirms the above findings. IMPRESSION: 1. Negative for acute pulmonary embolus. 2. There has been significant interval progression of a now massive hiatal hernia compared to prior imaging from 2018 and 2016. The entire stomach and the majority of the transverse colon are now intrathoracic and there is imaging evidence (diffuse mild dilatation of the cecum and ascending colon) concerning for low grade or partial colonic obstruction. No evidence of colonic wall thickening, inflammation or perforation at this time. There are 3 areas of circumferential narrowing involving the colon beginning at the hepatic flexure which is  displaced toward the left upper quadrant just prior to entering the hernia defect, and then again in the proximal and distal transverse colon were the colon enters and exits the hernia defect. These 3 segments are favored to be secondary to external compression related to the hernia, however an underlying colonic neoplasm is difficult to exclude entirely at the most proximal site which is outside the hernia defect. The patient's periumbilical pain is likely due to distension of the ascending colon and cecum. Patient may be at risk for more significant colonic obstruction. Recommend surgical consultation. 3. Moderately large layering right pleural effusion. 4. Trace left pleural effusion. 5. Coronary artery calcifications. 6.  Aortic Atherosclerosis (ICD10-170.0). 7. Severe multilevel degenerative disc disease and facet arthropathy with exaggerated thoracic kyphosis. No compression fractures identified. 8. Pelvic floor laxity with downward migration of the small bowel into the pelvis posterior to the rectum. As the colon has been drawn up into the chest, it appears the small bowel has descended to fill the evacuated space. No evidence of small bowel wall thickening or obstruction. 9. Additional ancillary findings as above. Electronically Signed   By: Jacqulynn Cadet M.D.   On: 02/21/2018 08:47    EKG: Independently reviewed.  Sinus bradycardia at 41 bpm with a left bundle branch block.  When compared to prior, she had a right bundle branch block and the rate was 53 bpm.   Assessment/Plan:   Principal Problem:   Bowel obstruction ([place]) in the setting of a massive paraesophageal hiatal hernia CT images reviewed.  She has a massive paraesophageal hiatal hernia containing stomach and transverse colon with partial obstruction.  She does not have any active vomiting, so no NG tube indicated at this time.  General surgery has evaluated the patient and has not found any indication for urgent surgical  intervention.  We will continue with bowel rest and IV fluid hydration.  Surgery will continue to follow as well.  Active Problems:   Essential hypertension/sinus bradycardia/hypothyroidism  We are holding Lasix, Imdur but will try to continue to give her amiodarone as this cannot easily be given through the IV route. Ordered TSH due to the bradycardia and it was abnormally high.  Will increase her IV Synthroid and will need her oral dose adjusted at d/c (from 50 to 75 mcg). Suspect persistent hypothyroidism impacting bradycardia.    CAD (coronary artery disease), native coronary artery/history of atrial flutter Will continue amiodarone and placed on therapeutic dose Lovenox since we will need to hold her oral blood thinners.    Chronic combined systolic and diastolic CHF (congestive heart failure) (HCC) Hold Lasix.  No sign of current CHF decompensation.   Other information:   DVT prophylaxis: Lovenox ordered. Code Status: Full code. Family Communication: Daughter updated bedside. Disposition Plan: Home when obstruction resolved.  Likely 48-72 hours. Consults called: General Surgery. Admission status: Inpatient given bowel obstruction on imaging with need for possible surgical intervention acutely.  The medical decision making on this patient was of high complexity and the patient is at high risk for clinical deterioration, therefore this is a level 3 visit.   Margreta Journey Madi Bonfiglio [facility] Pager [phone] Cell: [phone]   If 7PM-7AM, please contact night-coverage [email or web address] Password [email or web address] 02/21/2018, 11:45 AM

## 2018-02-21 NOTE — ED Notes (Signed)
Pt placed on bedpan

## 2018-02-21 NOTE — ED Notes (Addendum)
Patient transported to CT 

## 2018-02-22 ENCOUNTER — Inpatient Hospital Stay (HOSPITAL_COMMUNITY): Payer: Medicare Other

## 2018-02-22 DIAGNOSIS — K5669 Other partial intestinal obstruction: Secondary | ICD-10-CM

## 2018-02-22 DIAGNOSIS — E038 Other specified hypothyroidism: Secondary | ICD-10-CM

## 2018-02-22 LAB — CBC
HCT: 35.8 % — ABNORMAL LOW (ref 36.0–46.0)
Hemoglobin: 11.6 g/dL — ABNORMAL LOW (ref 12.0–15.0)
MCH: 30.4 pg (ref 26.0–34.0)
MCHC: 32.4 g/dL (ref 30.0–36.0)
MCV: 94 fL (ref 80.0–100.0)
NRBC: 0 % (ref 0.0–0.2)
Platelets: 137 10*3/uL — ABNORMAL LOW (ref 150–400)
RBC: 3.81 MIL/uL — ABNORMAL LOW (ref 3.87–5.11)
RDW: 14.6 % (ref 11.5–15.5)
WBC: 8 10*3/uL (ref 4.0–10.5)

## 2018-02-22 LAB — BASIC METABOLIC PANEL
Anion gap: 10 (ref 5–15)
BUN: 10 mg/dL (ref 8–23)
CO2: 26 mmol/L (ref 22–32)
Calcium: 8.6 mg/dL — ABNORMAL LOW (ref 8.9–10.3)
Chloride: 105 mmol/L (ref 98–111)
Creatinine, Ser: 0.75 mg/dL (ref 0.44–1.00)
GFR calc Af Amer: >60 mL/min (ref >60)
GFR calc non Af Amer: >60 mL/min (ref >60)
Glucose, Bld: 71 mg/dL (ref 70–99)
POTASSIUM: 3.5 mmol/L (ref 3.5–5.1)
SODIUM: 141 mmol/L (ref 135–145)

## 2018-02-22 NOTE — Progress Notes (Signed)
PROGRESS NOTE  Cathy [name] QPR:[id number] DOB: February 22, 1928 DOA: 02/20/2018 PCP: Biagio Borg, MD  HPI/Recap of past 24 hours: H/P by Dr Rockne Menghini; Cathy [name] is an 84 y.o. female  with a PMH of multiple medical problems including known hiatal hernia, chronic systolic CHF with an EF of 25-30% in 2016, but improved to 55-60% in 2018, hypertension, hyperlipidemia and diverticulosis who presents with a 24-hour history of abdominal pain associated with nausea who was brought to the ED for evaluation by her family who states she has been "gray looking "for the past 2-3 days and has not had an appetite.  Family reports that she has been complaining of abdominal pain but that she moved her bowels normally 2 days ago.  There has not been any associated vomiting.  The patient is very slow to respond and is unable to provide much additional history.  Today, patient denies any N/V or abdominal pain.  Assessment/Plan: Principal Problem:   Bowel obstruction (HCC) Active Problems:   Essential hypertension   Abnormal CT of the abdomen   CAD (coronary artery disease), native coronary artery   Chronic combined systolic and diastolic CHF (congestive heart failure) (HCC)   Sinus bradycardia   Hypothyroidism   Principal Problem:   Bowel obstruction ([place]) in the setting of a massive paraesophageal hiatal hernia CT images reviewed.  She has a massive paraesophageal hiatal hernia containing stomach and transverse colon with partial obstruction.   Per general surgery note, patient is a poor candidate. Continue conservative management   Active Problems:   Essential hypertension/sinus bradycardia/hypothyroidism Continue on IV synthroid.    CAD (coronary artery disease), native coronary artery/history of atrial flutter Will continue amiodarone and therapeutic dose Lovenox.   Chronic combined systolic and diastolic CHF (congestive heart failure) (HCC) Lasix on hold.    Malnutrition Type:       Malnutrition Characteristics:      Nutrition Interventions:       Estimated body mass index is 22.58 kg/m as calculated from the following:   Height as of this encounter: 4\' 11"  (1.499 m).   Weight as of this encounter: 50.7 kg.     Code Status: Full  Family Communication: Discussed with daughter at bedside.  Disposition Plan: To be determined   Consultants:  General surgery  Procedures:  None  Antimicrobials:  None  DVT prophylaxis: On lovenox   Objective: Vitals:   02/22/18 0444 02/22/18 0935 02/22/18 1837 02/22/18 2003  BP: (!) 165/74 (!) 155/65 (!) 150/71 (!) 176/71  Pulse: (!) 44 (!) 56 61 (!) 44  Resp: 17 17 18 18   Temp: 98.6 F (37 C) 97.9 F (36.6 C) 97.8 F (36.6 C) 98 F (36.7 C)  TempSrc:  Oral Oral Oral  SpO2: 92% 95% 95% 93%  Weight:      Height:        Intake/Output Summary (Last 24 hours) at 02/22/2018 2057 Last data filed at 02/22/2018 1838 Gross per 24 hour  Intake 2161.67 ml  Output 325 ml  Net 1836.67 ml   Filed Weights   02/20/18 2124 02/20/18 2131 02/21/18 2019  Weight: 50.8 kg 50.8 kg 50.7 kg    Exam:   General: NAD   Cardiovascular: S1, S2 present  Respiratory: CTAB  Abdomen: Soft, nontender, nondistended, bowel sounds present  Musculoskeletal: No bilateral pedal edema noted  Skin: Normal  Psychiatry: Normal mood    Data Reviewed: CBC: Recent Labs  Lab 02/20/18 2146 02/22/18 0603  WBC 11.3* 8.0  HGB 11.7* 11.6*  HCT 36.9 35.8*  MCV 93.4 94.0  PLT 167 789*   Basic Metabolic Panel: Recent Labs  Lab 02/20/18 2146 02/22/18 0603  NA 141 141  K 3.4* 3.5  CL 105 105  CO2 27 26  GLUCOSE 126* 71  BUN 14 10  CREATININE 0.90 0.75  CALCIUM 8.9 8.6*   GFR: Estimated Creatinine Clearance: 33.2 mL/min (by C-G formula based on SCr of 0.75 mg/dL). Liver Function Tests: Recent Labs  Lab 02/20/18 2146  AST 26  ALT 21  ALKPHOS 49  BILITOT 0.7  PROT 5.5*  ALBUMIN 3.1*   Recent Labs   Lab 02/20/18 2146  LIPASE 73*   No results for input(s): AMMONIA in the last 168 hours. Coagulation Profile: No results for input(s): INR, PROTIME in the last 168 hours. Cardiac Enzymes: Recent Labs  Lab 02/21/18 0534  TROPONINI <0.03   BNP (last 3 results) No results for input(s): PROBNP in the last 8760 hours. HbA1C: No results for input(s): HGBA1C in the last 72 hours. CBG: No results for input(s): GLUCAP in the last 168 hours. Lipid Profile: No results for input(s): CHOL, HDL, LDLCALC, TRIG, CHOLHDL, LDLDIRECT in the last 72 hours. Thyroid Function Tests: Recent Labs    02/21/18 1227  TSH 9.865*   Anemia Panel: No results for input(s): VITAMINB12, FOLATE, FERRITIN, TIBC, IRON, RETICCTPCT in the last 72 hours. Urine analysis:    Component Value Date/Time   COLORURINE YELLOW 02/20/2018 2134   APPEARANCEUR CLOUDY (A) 02/20/2018 2134   LABSPEC 1.016 02/20/2018 2134   PHURINE 6.0 02/20/2018 2134   GLUCOSEU NEGATIVE 02/20/2018 2134   GLUCOSEU NEGATIVE 04/11/2017 1536   HGBUR SMALL (A) 02/20/2018 2134   BILIRUBINUR NEGATIVE 02/20/2018 2134   BILIRUBINUR neg 11/18/2016 1520   KETONESUR NEGATIVE 02/20/2018 2134   PROTEINUR NEGATIVE 02/20/2018 2134   UROBILINOGEN 0.2 04/11/2017 1536   NITRITE NEGATIVE 02/20/2018 2134   LEUKOCYTESUR LARGE (A) 02/20/2018 2134   Sepsis Labs: @LABRCNTIP (procalcitonin:4,lacticidven:4)  ) Recent Results (from the past 240 hour(s))  Urine culture     Status: Abnormal (Preliminary result)   Collection Time: 02/21/18  7:12 AM  Result Value Ref Range Status   Specimen Description URINE, RANDOM  Final   Special Requests   Final    NONE Performed at [facility], [address] [place], [place] [address]    Culture >=100,000 COLONIES/mL ESCHERICHIA COLI (A)  Final   Report Status PENDING  Incomplete      Studies: Dg Abd 2 Views  Result Date: 02/22/2018 CLINICAL DATA:  Hiatal hernia, irritable bowel syndrome, hypertension  EXAM: ABDOMEN - 2 VIEW COMPARISON:  CT abdomen and pelvis 02/21/2018 FINDINGS: Large hiatal hernia containing stomach and colon. Bowel gas pattern otherwise normal. Retained contrast in distal colon. No bowel dilatation or bowel wall thickening. Osseous demineralization with degenerative disc and facet disease changes of the thoracolumbar spine associated with biconvex scoliosis. Surgical clips in RIGHT upper quadrant from cholecystectomy. Bibasilar pleural effusions and atelectasis. IMPRESSION: Large hiatal hernia containing stomach and colon. Otherwise normal bowel gas pattern. Bibasilar pleural effusions and atelectasis. Electronically Signed   By: Lavonia Dana M.D.   On: 02/22/2018 16:32    Scheduled Meds: . amiodarone  200 mg Oral Daily  . enoxaparin (LOVENOX) injection  1 mg/kg Subcutaneous BID  . levothyroxine  37.5 mcg Intravenous Daily    Continuous Infusions: . sodium chloride 100 mL/hr at 02/22/18 1926     LOS: 1 day  Alma Friendly, MD [facility]  If 7PM-7AM, please contact night-coverage [email or web address] 02/22/2018, 8:57 PM

## 2018-02-22 NOTE — Progress Notes (Addendum)
IW:[id number] pain  Subjective: Pt states she has done well till about 2 days ago.  Then she developed pain after eating.  Family member says she had 8/10 pain.  Prior to that she has had no problems.  She did have a bowel movement yesterday.  Currently she is mildly distended but having no pain.  She is n.p.o.  She has had no nausea or vomiting.  Objective: Vital signs in last 24 hours: Temp:  [97.9 F (36.6 C)-98.8 F (37.1 C)] 98.6 F (37 C) (12/25 0444) Pulse Rate:  [40-44] 44 (12/25 0444) Resp:  [14-22] 17 (12/25 0444) BP: (136-187)/(59-74) 165/74 (12/25 0444) SpO2:  [91 %-96 %] 92 % (12/25 0444) Weight:  [50.7 kg] 50.7 kg (12/24 2019) Last BM Date: 02/21/18 BM reported yesterday. N.p.o. 425 urine recorded Afebrile vital signs are stable, systolic blood pressure elevated. Labs show potassium of 3.5, otherwise normal BMP WBC 8.0 Hemoglobin 11.6, hematocrit 35.8, platelets 137 TSH 9.86 Urine culture greater than 100,000 gram-negative rods CT abdomen pelvis yesterday: Negative for PE Massive hiatal hernia increased from prior studies.  The entire stomach and majority of the transverse colon are in the intrathoracic there was concern for a low-grade partial obstruction of the cecum and ascending colon.  There is a layering right pleural effusion, trace left pleural effusion and severe degenerative disks disease. Intake/Output from previous day: 12/24 0701 - 12/25 0700 In: 1733.6 [I.V.:1733.6] Out: 425 [Urine:425] Intake/Output this shift: No intake/output data recorded.  General appearance: alert, cooperative and no distress Resp: clear to auscultation bilaterally and Anterior exam GI: Soft, some mild distention.  Positive bowel sounds.  BM reported yesterday.  Lab Results:  Recent Labs    02/20/18 2146 02/22/18 0603  WBC 11.3* 8.0  HGB 11.7* 11.6*  HCT 36.9 35.8*  PLT 167 137*    BMET Recent Labs    02/20/18 2146 02/22/18 0603  NA 141 141  K 3.4* 3.5   CL 105 105  CO2 27 26  GLUCOSE 126* 71  BUN 14 10  CREATININE 0.90 0.75  CALCIUM 8.9 8.6*   PT/INR No results for input(s): LABPROT, INR in the last 72 hours.  Recent Labs  Lab 02/20/18 2146  AST 26  ALT 21  ALKPHOS 49  BILITOT 0.7  PROT 5.5*  ALBUMIN 3.1*     Lipase     Component Value Date/Time   LIPASE 73 (H) 02/20/2018 2146     Medications: . amiodarone  200 mg Oral Daily  . enoxaparin (LOVENOX) injection  1 mg/kg Subcutaneous BID  . levothyroxine  37.5 mcg Intravenous Daily   . sodium chloride 100 mL/hr at 02/22/18 4174   Anti-infectives (From admission, onward)   Start     Dose/Rate Route Frequency Ordered Stop   02/21/18 0915  cefTRIAXone (ROCEPHIN) 1 g in sodium chloride 0.9 % 100 mL IVPB     1 g 200 mL/hr over 30 Minutes Intravenous  Once 02/21/18 0905 02/21/18 1033   02/21/18 0900  ciprofloxacin (CIPRO) IVPB 400 mg  Status:  Discontinued     400 mg 200 mL/hr over 60 Minutes Intravenous  Once 02/21/18 0858 02/21/18 0905   02/21/18 0900  metroNIDAZOLE (FLAGYL) IVPB 500 mg  Status:  Discontinued     500 mg 100 mL/hr over 60 Minutes Intravenous  Once 02/21/18 0858 02/21/18 0905      Assessment/Plan Bilateral pleural effusions Possible pneumonia UTI - Urine culture greater than 100,000 gram-negative rods Hypertension Sinus bradycardia Atrial flutter on  anticoagulation -Eliquis last dose 12/23 at 1000 hrs Hypothyroid  Chronic combined systolic and diastolic congestive heart failure  Large paraesophageal hernia containing stomach/transverse colon Concern for partial obstruction  FEN: IV fluids/n.p.o. ID: Rocephin 1 g IV yesterday no other antibiotics given. DVT: Lovenox 50 mg twice daily Follow-up: To be determined  Plan: As noted yesterday by Dr. Windle Guard patient be a very high risk for surgical procedure.  Surgical treatment would include reduction of the hernia contents and gastropexy, there is a possibility of colon resection, colostomy with  gastrostomy for gastropexy also.  We are going to recommend starting clear liquids and see how she does.  She would be at high risk for any surgical intervention with all her comorbid issues.   LOS: 1 day    Katriona Schmierer 02/22/2018 [phone]

## 2018-02-22 NOTE — Plan of Care (Signed)

## 2018-02-23 DIAGNOSIS — N390 Urinary tract infection, site not specified: Secondary | ICD-10-CM

## 2018-02-23 DIAGNOSIS — R001 Bradycardia, unspecified: Secondary | ICD-10-CM

## 2018-02-23 DIAGNOSIS — I2510 Atherosclerotic heart disease of native coronary artery without angina pectoris: Secondary | ICD-10-CM

## 2018-02-23 DIAGNOSIS — I5042 Chronic combined systolic (congestive) and diastolic (congestive) heart failure: Secondary | ICD-10-CM

## 2018-02-23 DIAGNOSIS — Z1612 Extended spectrum beta lactamase (ESBL) resistance: Secondary | ICD-10-CM

## 2018-02-23 DIAGNOSIS — E039 Hypothyroidism, unspecified: Secondary | ICD-10-CM

## 2018-02-23 DIAGNOSIS — R935 Abnormal findings on diagnostic imaging of other abdominal regions, including retroperitoneum: Secondary | ICD-10-CM

## 2018-02-23 DIAGNOSIS — B9629 Other Escherichia coli [E. coli] as the cause of diseases classified elsewhere: Secondary | ICD-10-CM

## 2018-02-23 DIAGNOSIS — I10 Essential (primary) hypertension: Secondary | ICD-10-CM

## 2018-02-23 LAB — URINE CULTURE

## 2018-02-23 MED ORDER — PIPERACILLIN-TAZOBACTAM 3.375 G IVPB
3.3750 g | Freq: Three times a day (TID) | INTRAVENOUS | Status: AC
  Administered 2018-02-23 – 2018-02-27 (×14): 3.375 g via INTRAVENOUS
  Filled 2018-02-23 (×14): qty 50

## 2018-02-23 MED ORDER — APIXABAN 2.5 MG PO TABS
2.5000 mg | ORAL_TABLET | Freq: Two times a day (BID) | ORAL | Status: DC
  Administered 2018-02-23 – 2018-03-02 (×13): 2.5 mg via ORAL
  Filled 2018-02-23 (×14): qty 1

## 2018-02-23 MED ORDER — ISOSORBIDE MONONITRATE ER 30 MG PO TB24
30.0000 mg | ORAL_TABLET | Freq: Every day | ORAL | Status: DC
  Administered 2018-02-23 – 2018-02-25 (×3): 30 mg via ORAL
  Filled 2018-02-23 (×3): qty 1

## 2018-02-23 MED ORDER — HALOPERIDOL LACTATE 5 MG/ML IJ SOLN
1.0000 mg | Freq: Once | INTRAMUSCULAR | Status: AC
  Administered 2018-02-23: 1 mg via INTRAVENOUS
  Filled 2018-02-23: qty 1

## 2018-02-23 MED ORDER — LEVOTHYROXINE SODIUM 50 MCG PO TABS
62.5000 ug | ORAL_TABLET | Freq: Every day | ORAL | Status: DC
  Administered 2018-02-24 – 2018-03-02 (×7): 62.5 ug via ORAL
  Filled 2018-02-23 (×9): qty 1

## 2018-02-23 NOTE — Progress Notes (Signed)
Pharmacy Antibiotic Note  Cathy [name] is a 84 y.o. female admitted on 02/20/2018 with ESBL UTI.  Pharmacy has been consulted for zosyn dosing. Urine cultures growing ESBL E.coli sensitive to zosyn.   Plan: Zosyn 3.375gm IV q8h (EI) Monitor for clinical course, LOT, and deescalation  Height: 4\' 11"  (149.9 cm) Weight: 111 lb 12.4 oz (50.7 kg) IBW/kg (Calculated) : 43.2  Temp (24hrs), Avg:98 F (36.7 C), Min:97.8 F (36.6 C), Max:98.2 F (36.8 C)  Recent Labs  Lab 02/20/18 2146 02/22/18 0603  WBC 11.3* 8.0  CREATININE 0.90 0.75    Estimated Creatinine Clearance: 33.2 mL/min (by C-G formula based on SCr of 0.75 mg/dL).    Allergies  Allergen Reactions  . Amoxicillin-Pot Clavulanate Diarrhea    Has patient had a PCN reaction causing immediate rash, facial/tongue/throat swelling, SOB or lightheadedness with hypotension: no Has patient had a PCN reaction causing severe rash involving mucus membranes or skin necrosis:  no Has patient had a PCN reaction that required hospitalization: no Has patient had a PCN reaction occurring within the last 10 years no If all of the above answers are "NO", then may proceed with Cephalosporin use.   . Sulfonamide Derivatives Other (See Comments)    unknown      Microbiology results:  12/24 UCx: E.coli ESBL     Cathy [name], PharmD, [place] Pager: [phone] Please utilize Amion for appropriate phone number to reach the unit pharmacist ([place])   02/23/2018 12:08 PM

## 2018-02-23 NOTE — Progress Notes (Signed)
Pt alert and oriented times 4 last night vital signs stable,pt did not sleep all night,around 2 am the pt started to get confused and wanting to go home not following simple commands,pt continued to get out of bed,pt brought to the nursing station in recliner chair,pt became verbally and physically abusive to staff kicking,biting,scratching and hitting.Pt continue to get up from the chair would not follow simple commands.pt scraped the back of both her legs on the recliner continually putting the foot rest down, two foam pads applied to skin tear.Baltazar Najjar the NP notified see physician orders.Pt finally calmed down around 06:30 AM and following simple commands,pt at the nursing station will continue to [employer]

## 2018-02-23 NOTE — Progress Notes (Signed)
Subjective: Doing well from GI standpoint.  Denies abdominal pain nausea or vomiting.  Tolerating full liquids.  Having bowel movements. Abdominal x-rays yesterday show the hiatal hernia but there is absolutely no obstructive gas pattern  Mental status has deteriorated.  She did not sleep last night.  She is sitting up at the desk with the nursing staff.  She is disoriented to place and situation.  Has been somewhat abusive and striking out at the nursing staff.  She does not appear to be in any physical distress. Otherwise stable vital signs.  SPO2 93% on room air.  Heart rate 62. All lab work yesterday looked fine.  Objective: Vital signs in last 24 hours: Temp:  [97.8 F (36.6 C)-98 F (36.7 C)] 97.8 F (36.6 C) (12/26 0342) Pulse Rate:  [44-62] 62 (12/26 0342) Resp:  [16-18] 16 (12/26 0342) BP: (150-188)/(65-86) 188/86 (12/26 0342) SpO2:  [93 %-95 %] 93 % (12/26 0342) Last BM Date: 02/22/18  Intake/Output from previous day: 12/25 0701 - 12/26 0700 In: 1040 [P.O.:1040] Out: -  Intake/Output this shift: No intake/output data recorded.  PE: General appearance: alert, disoriented to place and situation.  Intermittently agitated.  Speech normal.  Skin warm and dry.  In no physical distress. Resp: clear to auscultation bilaterally  GI: Soft, no distention.    Nontender.  Positive bowel sounds.  BM's reported yesterday.   Lab Results:  Recent Labs    02/20/18 2146 02/22/18 0603  WBC 11.3* 8.0  HGB 11.7* 11.6*  HCT 36.9 35.8*  PLT 167 137*   BMET Recent Labs    02/20/18 2146 02/22/18 0603  NA 141 141  K 3.4* 3.5  CL 105 105  CO2 27 26  GLUCOSE 126* 71  BUN 14 10  CREATININE 0.90 0.75  CALCIUM 8.9 8.6*   PT/INR No results for input(s): LABPROT, INR in the last 72 hours. ABG No results for input(s): PHART, HCO3 in the last 72 hours.  Invalid input(s): PCO2, PO2  Studies/Results: Ct Angio Chest Pe W And/or Wo Contrast  Result Date: 02/21/2018 CLINICAL  DATA:  84 year old female with periumbilical abdominal pain for the past 24 hours and nausea but no vomiting. Extensive medical history including coronary artery disease, congestive heart failure, hypertension, atrial flutter and prior colitis. EXAM: CT ANGIOGRAPHY CHEST CT ABDOMEN AND PELVIS WITH CONTRAST TECHNIQUE: Multidetector CT imaging of the chest was performed using the standard protocol during bolus administration of intravenous contrast. Multiplanar CT image reconstructions and MIPs were obtained to evaluate the vascular anatomy. Multidetector CT imaging of the abdomen and pelvis was performed using the standard protocol during bolus administration of intravenous contrast. CONTRAST:  166mL ISOVUE-370 IOPAMIDOL (ISOVUE-370) INJECTION 76% COMPARISON:  Most recent prior CT scan of the abdomen and pelvis 11/27/2016; prior PET-CT including chest CT 02/21/2015 FINDINGS: CTA CHEST FINDINGS Cardiovascular: Adequate opacification of the pulmonary arteries to the proximal segmental level. No evidence of filling defect to suggest acute pulmonary embolus. Marked mass effect on the heart secondary to the massive hiatal hernia. Calcification present in the mitral valve annulus. CT atherosclerotic calcifications noted along the coronary arteries. No pericardial effusion. Mediastinum/Nodes: Progressive massive hiatal hernia containing the entire stomach and a large segment of colon. There has been substantial worsening compared to 11/27/2016. Lungs/Pleura: Moderately large layering right pleural effusion. Trace left pleural effusion. Marked atelectasis of both lower lobe secondary to a combination of pleural effusion and mass effect from the hiatal hernia. Musculoskeletal: Heavily exaggerated thoracic kyphosis. Multilevel degenerative disc disease and  facet arthropathy. No acute fracture, malalignment or evidence of lytic or blastic osseous lesion. Review of the MIP images confirms the above findings. CT ABDOMEN and  PELVIS FINDINGS Hepatobiliary: Punctate calcification in the right liver, unchanged and likely sequelae of old granulomatous disease. No suspicious liver lesion. The gallbladder is surgically absent. Similar degree of relatively severe intra and extrahepatic biliary ductal dilatation compared to prior imaging from 11/27/2016. Common bile duct again measures 14 mm. No definite distal mass or filling defect. Pancreas: No definite pancreatic mass or inflammatory changes. Anatomy is distorted by upward migration of the stomach and transverse colon. Spleen: Similar appearance of the spleen with innumerable low-attenuation lesions scattered throughout. Lesions are not substantially changed in size, or configuration compared to prior imaging from 2018 and 2016. These lesions demonstrated no evidence of hypermetabolic activity on prior PET-CT and are considered benign. Adrenals/Urinary Tract: Stable 3 cm left adrenal adenoma. The right adrenal gland is unremarkable. Mild renal cortical thinning bilaterally with small areas of renal scarring versus tiny cyst formation. No hydronephrosis, nephrolithiasis or enhancing renal mass. Stomach/Bowel: Herniation of a large segments of the transverse colon into the large hiatal hernia. There is an approximately 3.8 cm segment of circumferential narrowing in the region of the displaced hepatic flexure which is nonspecific. Additionally, there is a second region of focal mild wall thickening and narrowing measuring approximately 4.6 cm in the proximal transverse colon where the colon passes through the hiatal hernia defect. There is a third region of colonic narrowing were the more distal transverse colon exits the hiatal hernia just proximal to the splenic flexure. The cecum and ascending colon are mildly distended proximal to these 2 sites and low-grade obstruction is not excluded. Vascular/Lymphatic: Atherosclerotic calcifications throughout the highly tortuous abdominal aorta. No  evidence of dissection or aneurysm. No suspicious lymphadenopathy. Reproductive: Surgical changes of hysterectomy. Significant pelvic floor laxity with descent of small bowel into the region of laxity. Other: Reticulation of the subcutaneous fat diffusely consistent with body wall edema or anasarca. No peritoneal ascites. Musculoskeletal: Severe multilevel degenerative disc disease and facet arthropathy. No definite acute compression fracture. No lytic or blastic osseous lesion. Review of the MIP images confirms the above findings. IMPRESSION: 1. Negative for acute pulmonary embolus. 2. There has been significant interval progression of a now massive hiatal hernia compared to prior imaging from 2018 and 2016. The entire stomach and the majority of the transverse colon are now intrathoracic and there is imaging evidence (diffuse mild dilatation of the cecum and ascending colon) concerning for low grade or partial colonic obstruction. No evidence of colonic wall thickening, inflammation or perforation at this time. There are 3 areas of circumferential narrowing involving the colon beginning at the hepatic flexure which is displaced toward the left upper quadrant just prior to entering the hernia defect, and then again in the proximal and distal transverse colon were the colon enters and exits the hernia defect. These 3 segments are favored to be secondary to external compression related to the hernia, however an underlying colonic neoplasm is difficult to exclude entirely at the most proximal site which is outside the hernia defect. The patient's periumbilical pain is likely due to distension of the ascending colon and cecum. Patient may be at risk for more significant colonic obstruction. Recommend surgical consultation. 3. Moderately large layering right pleural effusion. 4. Trace left pleural effusion. 5. Coronary artery calcifications. 6.  Aortic Atherosclerosis (ICD10-170.0). 7. Severe multilevel degenerative  disc disease and facet arthropathy with exaggerated thoracic  kyphosis. No compression fractures identified. 8. Pelvic floor laxity with downward migration of the small bowel into the pelvis posterior to the rectum. As the colon has been drawn up into the chest, it appears the small bowel has descended to fill the evacuated space. No evidence of small bowel wall thickening or obstruction. 9. Additional ancillary findings as above. Electronically Signed   By: Jacqulynn Cadet M.D.   On: 02/21/2018 08:47   Ct Abdomen Pelvis W Contrast  Result Date: 02/21/2018 CLINICAL DATA:  84 year old female with periumbilical abdominal pain for the past 24 hours and nausea but no vomiting. Extensive medical history including coronary artery disease, congestive heart failure, hypertension, atrial flutter and prior colitis. EXAM: CT ANGIOGRAPHY CHEST CT ABDOMEN AND PELVIS WITH CONTRAST TECHNIQUE: Multidetector CT imaging of the chest was performed using the standard protocol during bolus administration of intravenous contrast. Multiplanar CT image reconstructions and MIPs were obtained to evaluate the vascular anatomy. Multidetector CT imaging of the abdomen and pelvis was performed using the standard protocol during bolus administration of intravenous contrast. CONTRAST:  153mL ISOVUE-370 IOPAMIDOL (ISOVUE-370) INJECTION 76% COMPARISON:  Most recent prior CT scan of the abdomen and pelvis 11/27/2016; prior PET-CT including chest CT 02/21/2015 FINDINGS: CTA CHEST FINDINGS Cardiovascular: Adequate opacification of the pulmonary arteries to the proximal segmental level. No evidence of filling defect to suggest acute pulmonary embolus. Marked mass effect on the heart secondary to the massive hiatal hernia. Calcification present in the mitral valve annulus. CT atherosclerotic calcifications noted along the coronary arteries. No pericardial effusion. Mediastinum/Nodes: Progressive massive hiatal hernia containing the entire stomach  and a large segment of colon. There has been substantial worsening compared to 11/27/2016. Lungs/Pleura: Moderately large layering right pleural effusion. Trace left pleural effusion. Marked atelectasis of both lower lobe secondary to a combination of pleural effusion and mass effect from the hiatal hernia. Musculoskeletal: Heavily exaggerated thoracic kyphosis. Multilevel degenerative disc disease and facet arthropathy. No acute fracture, malalignment or evidence of lytic or blastic osseous lesion. Review of the MIP images confirms the above findings. CT ABDOMEN and PELVIS FINDINGS Hepatobiliary: Punctate calcification in the right liver, unchanged and likely sequelae of old granulomatous disease. No suspicious liver lesion. The gallbladder is surgically absent. Similar degree of relatively severe intra and extrahepatic biliary ductal dilatation compared to prior imaging from 11/27/2016. Common bile duct again measures 14 mm. No definite distal mass or filling defect. Pancreas: No definite pancreatic mass or inflammatory changes. Anatomy is distorted by upward migration of the stomach and transverse colon. Spleen: Similar appearance of the spleen with innumerable low-attenuation lesions scattered throughout. Lesions are not substantially changed in size, or configuration compared to prior imaging from 2018 and 2016. These lesions demonstrated no evidence of hypermetabolic activity on prior PET-CT and are considered benign. Adrenals/Urinary Tract: Stable 3 cm left adrenal adenoma. The right adrenal gland is unremarkable. Mild renal cortical thinning bilaterally with small areas of renal scarring versus tiny cyst formation. No hydronephrosis, nephrolithiasis or enhancing renal mass. Stomach/Bowel: Herniation of a large segments of the transverse colon into the large hiatal hernia. There is an approximately 3.8 cm segment of circumferential narrowing in the region of the displaced hepatic flexure which is nonspecific.  Additionally, there is a second region of focal mild wall thickening and narrowing measuring approximately 4.6 cm in the proximal transverse colon where the colon passes through the hiatal hernia defect. There is a third region of colonic narrowing were the more distal transverse colon exits the hiatal hernia just  proximal to the splenic flexure. The cecum and ascending colon are mildly distended proximal to these 2 sites and low-grade obstruction is not excluded. Vascular/Lymphatic: Atherosclerotic calcifications throughout the highly tortuous abdominal aorta. No evidence of dissection or aneurysm. No suspicious lymphadenopathy. Reproductive: Surgical changes of hysterectomy. Significant pelvic floor laxity with descent of small bowel into the region of laxity. Other: Reticulation of the subcutaneous fat diffusely consistent with body wall edema or anasarca. No peritoneal ascites. Musculoskeletal: Severe multilevel degenerative disc disease and facet arthropathy. No definite acute compression fracture. No lytic or blastic osseous lesion. Review of the MIP images confirms the above findings. IMPRESSION: 1. Negative for acute pulmonary embolus. 2. There has been significant interval progression of a now massive hiatal hernia compared to prior imaging from 2018 and 2016. The entire stomach and the majority of the transverse colon are now intrathoracic and there is imaging evidence (diffuse mild dilatation of the cecum and ascending colon) concerning for low grade or partial colonic obstruction. No evidence of colonic wall thickening, inflammation or perforation at this time. There are 3 areas of circumferential narrowing involving the colon beginning at the hepatic flexure which is displaced toward the left upper quadrant just prior to entering the hernia defect, and then again in the proximal and distal transverse colon were the colon enters and exits the hernia defect. These 3 segments are favored to be secondary to  external compression related to the hernia, however an underlying colonic neoplasm is difficult to exclude entirely at the most proximal site which is outside the hernia defect. The patient's periumbilical pain is likely due to distension of the ascending colon and cecum. Patient may be at risk for more significant colonic obstruction. Recommend surgical consultation. 3. Moderately large layering right pleural effusion. 4. Trace left pleural effusion. 5. Coronary artery calcifications. 6.  Aortic Atherosclerosis (ICD10-170.0). 7. Severe multilevel degenerative disc disease and facet arthropathy with exaggerated thoracic kyphosis. No compression fractures identified. 8. Pelvic floor laxity with downward migration of the small bowel into the pelvis posterior to the rectum. As the colon has been drawn up into the chest, it appears the small bowel has descended to fill the evacuated space. No evidence of small bowel wall thickening or obstruction. 9. Additional ancillary findings as above. Electronically Signed   By: Jacqulynn Cadet M.D.   On: 02/21/2018 08:47   Dg Abd 2 Views  Result Date: 02/22/2018 CLINICAL DATA:  Hiatal hernia, irritable bowel syndrome, hypertension EXAM: ABDOMEN - 2 VIEW COMPARISON:  CT abdomen and pelvis 02/21/2018 FINDINGS: Large hiatal hernia containing stomach and colon. Bowel gas pattern otherwise normal. Retained contrast in distal colon. No bowel dilatation or bowel wall thickening. Osseous demineralization with degenerative disc and facet disease changes of the thoracolumbar spine associated with biconvex scoliosis. Surgical clips in RIGHT upper quadrant from cholecystectomy. Bibasilar pleural effusions and atelectasis. IMPRESSION: Large hiatal hernia containing stomach and colon. Otherwise normal bowel gas pattern. Bibasilar pleural effusions and atelectasis. Electronically Signed   By: Lavonia Dana M.D.   On: 02/22/2018 16:32    Anti-infectives: Anti-infectives (From admission,  onward)   Start     Dose/Rate Route Frequency Ordered Stop   02/21/18 0915  cefTRIAXone (ROCEPHIN) 1 g in sodium chloride 0.9 % 100 mL IVPB     1 g 200 mL/hr over 30 Minutes Intravenous  Once 02/21/18 0905 02/21/18 1033   02/21/18 0900  ciprofloxacin (CIPRO) IVPB 400 mg  Status:  Discontinued     400 mg 200 mL/hr over  60 Minutes Intravenous  Once 02/21/18 0858 02/21/18 0905   02/21/18 0900  metroNIDAZOLE (FLAGYL) IVPB 500 mg  Status:  Discontinued     500 mg 100 mL/hr over 60 Minutes Intravenous  Once 02/21/18 0858 02/21/18 8916      Assessment/Plan:   giant paraesophageal hernia which also involves stomach and transverse colon Clinically she does not behave as if there is any ischemia, volvulus, or obstruction Poor surgical candidate Bowel regimen with prune juice or stool softeners Advance diet as tolerated Given her comorbidities and advanced age and the absence of any surgical complication, we do not plan surgical intervention. We will follow from a distance from here on out  Altered mental status.  Suspect acute disorientation is superimposed on mild dementia.  Does not appear septic.  Not hypoxic.  Defer to medical service  Bilateral pleural effusions Possible pneumonia UTI - Urine culture greater than 100,000 gram-negative rods Hypertension Sinus bradycardia Atrial flutter on anticoagulation -Eliquis last dose 12/23 at 1000 hrs Hypothyroid  Chronic combined systolic and diastolic congestive heart failure   LOS: 2 days    Adin Hector 02/23/2018

## 2018-02-23 NOTE — Progress Notes (Signed)
PROGRESS NOTE    Cathy [name]  GEX:[id number] DOB: 1927-07-10 DOA: 02/20/2018 PCP: Biagio Borg, MD   Brief Narrative: Cathy [name] is a 84 y.o. femalewith a PMH of multiple medical problems including known hiatal hernia, chronic systolic CHF with an EF of 25-30% in 2016, but improved to 55-60% in 2018, hypertension, hyperlipidemia and diverticulosis. She presented with abdominal pain and found to have a paraesophageal hernia containing stomach and colon with concern for partial bowel obstruction. Non-operative management.   Assessment & Plan:   Principal Problem:   Bowel obstruction (HCC) Active Problems:   Essential hypertension   Abnormal CT of the abdomen   CAD (coronary artery disease), native coronary artery   Chronic combined systolic and diastolic CHF (congestive heart failure) (HCC)   Sinus bradycardia   Hypothyroidism   Paraesophageal hernia Containing stomach and colon with concern for partial bowel obstruction. General surgery evaluated and plan for conservative, non-operative management. -General surgery recommendations: Advance diet as tolerated  Essential hypertension On Imdur as an outpatient.  Atrial fibrillation -Continue amiodarone  Sinus bradycardia Asymptomatic.  Hypothyroidism Started on Synthroid 50 mcg as an outpatient. Increased to 75 mcg inpatient. -Will decrease to 62.5 mcg daily in AM -Outpatient TSH recheck in 5-6 weeks  Right sided pleural effusion Chronic since one year ago. Asymptomatic. No symptoms concerning for pneumonia. -Outpatient follow-up  CAD -Continue Lipitor, Imdur  Chronic heart failure with preserved EF Compensated. No evidence of systolic dysfunction on recent Transthoracic Echocardiogram. Follows with cardiology as an outpatient. On Lasix as an outpatient. -Daily weights  UTI ESBL e. Coli. Patient with polyuria but somewhat chronic. Does have some confusion but no evidence of systemic disease. No pyelonephritis  on CT scan. Allergy to sulfonamides -Zosyn IV   DVT prophylaxis: Eliquis Code Status:   Code Status: Full Code Family Communication: Daughter at bedside Disposition Plan: Discharge pending improvement of symptoms and PT evaluation   Consultants:   General surgery  Procedures:   None  Antimicrobials:  Zosyn (12/26>>    Subjective: No abdominal pain, nausea or vomiting.  Objective: Vitals:   02/22/18 1837 02/22/18 2003 02/23/18 0342 02/23/18 0951  BP: (!) 150/71 (!) 176/71 (!) 188/86 (!) 188/75  Pulse: 61 (!) 44 62 (!) 53  Resp: 18 18 16 16   Temp: 97.8 F (36.6 C) 98 F (36.7 C) 97.8 F (36.6 C) 98.2 F (36.8 C)  TempSrc: Oral Oral Oral Oral  SpO2: 95% 93% 93% 96%  Weight:      Height:        Intake/Output Summary (Last 24 hours) at 02/23/2018 1634 Last data filed at 02/23/2018 1300 Gross per 24 hour  Intake 320 ml  Output -  Net 320 ml   Filed Weights   02/20/18 2124 02/20/18 2131 02/21/18 2019  Weight: 50.8 kg 50.8 kg 50.7 kg    Examination:  General exam: Appears calm and comfortable Respiratory system: Clear to auscultation. Respiratory effort normal. Cardiovascular system: S1 & S2 heard, RRR. No murmurs, rubs, gallops or clicks. Gastrointestinal system: Abdomen is nondistended, soft and nontender. No organomegaly or masses felt. Normal bowel sounds heard. Central nervous system: Alert and oriented. No focal neurological deficits. Extremities: No edema. No calf tenderness Skin: No cyanosis. No rashes Psychiatry: Judgement and insight appear normal. Mood & affect appropriate.     Data Reviewed: I have personally reviewed following labs and imaging studies  CBC: Recent Labs  Lab 02/20/18 2146 02/22/18 0603  WBC 11.3* 8.0  HGB 11.7* 11.6*  HCT 36.9 35.8*  MCV 93.4 94.0  PLT 167 559*   Basic Metabolic Panel: Recent Labs  Lab 02/20/18 2146 02/22/18 0603  NA 141 141  K 3.4* 3.5  CL 105 105  CO2 27 26  GLUCOSE 126* 71  BUN 14 10    CREATININE 0.90 0.75  CALCIUM 8.9 8.6*   GFR: Estimated Creatinine Clearance: 33.2 mL/min (by C-G formula based on SCr of 0.75 mg/dL). Liver Function Tests: Recent Labs  Lab 02/20/18 2146  AST 26  ALT 21  ALKPHOS 49  BILITOT 0.7  PROT 5.5*  ALBUMIN 3.1*   Recent Labs  Lab 02/20/18 2146  LIPASE 73*   No results for input(s): AMMONIA in the last 168 hours. Coagulation Profile: No results for input(s): INR, PROTIME in the last 168 hours. Cardiac Enzymes: Recent Labs  Lab 02/21/18 0534  TROPONINI <0.03   BNP (last 3 results) No results for input(s): PROBNP in the last 8760 hours. HbA1C: No results for input(s): HGBA1C in the last 72 hours. CBG: No results for input(s): GLUCAP in the last 168 hours. Lipid Profile: No results for input(s): CHOL, HDL, LDLCALC, TRIG, CHOLHDL, LDLDIRECT in the last 72 hours. Thyroid Function Tests: Recent Labs    02/21/18 1227  TSH 9.865*   Anemia Panel: No results for input(s): VITAMINB12, FOLATE, FERRITIN, TIBC, IRON, RETICCTPCT in the last 72 hours. Sepsis Labs: No results for input(s): PROCALCITON, LATICACIDVEN in the last 168 hours.  Recent Results (from the past 240 hour(s))  Urine culture     Status: Abnormal   Collection Time: 02/21/18  7:12 AM  Result Value Ref Range Status   Specimen Description URINE, RANDOM  Final   Special Requests   Final    NONE Performed at [facility], [address], [place], [place] [address]    Culture (A)  Final    >=100,000 COLONIES/mL ESCHERICHIA COLI Confirmed Extended Spectrum Beta-Lactamase Producer (ESBL).  In bloodstream infections from ESBL organisms, carbapenems are preferred over piperacillin/tazobactam. They are shown to have a lower risk of mortality.    Report Status 02/23/2018 FINAL  Final   Organism ID, Bacteria ESCHERICHIA COLI (A)  Final      Susceptibility   Escherichia coli - MIC*    AMPICILLIN >=32 RESISTANT Resistant     CEFAZOLIN >=64 RESISTANT Resistant      CEFTRIAXONE >=64 RESISTANT Resistant     CIPROFLOXACIN >=4 RESISTANT Resistant     GENTAMICIN <=1 SENSITIVE Sensitive     IMIPENEM <=0.25 SENSITIVE Sensitive     NITROFURANTOIN <=16 SENSITIVE Sensitive     TRIMETH/SULFA <=20 SENSITIVE Sensitive     AMPICILLIN/SULBACTAM >=32 RESISTANT Resistant     PIP/TAZO 8 SENSITIVE Sensitive     Extended ESBL POSITIVE Resistant     * >=100,000 COLONIES/mL ESCHERICHIA COLI         Radiology Studies: Dg Abd 2 Views  Result Date: 02/22/2018 CLINICAL DATA:  Hiatal hernia, irritable bowel syndrome, hypertension EXAM: ABDOMEN - 2 VIEW COMPARISON:  CT abdomen and pelvis 02/21/2018 FINDINGS: Large hiatal hernia containing stomach and colon. Bowel gas pattern otherwise normal. Retained contrast in distal colon. No bowel dilatation or bowel wall thickening. Osseous demineralization with degenerative disc and facet disease changes of the thoracolumbar spine associated with biconvex scoliosis. Surgical clips in RIGHT upper quadrant from cholecystectomy. Bibasilar pleural effusions and atelectasis. IMPRESSION: Large hiatal hernia containing stomach and colon. Otherwise normal bowel gas pattern. Bibasilar pleural effusions and atelectasis. Electronically Signed   By: Elta Guadeloupe  Thornton Papas M.D.   On: 02/22/2018 16:32        Scheduled Meds: . amiodarone  200 mg Oral Daily  . enoxaparin (LOVENOX) injection  1 mg/kg Subcutaneous BID  . levothyroxine  37.5 mcg Intravenous Daily   Continuous Infusions: . sodium chloride 100 mL/hr at 02/23/18 0627  . piperacillin-tazobactam (ZOSYN)  IV 3.375 g (02/23/18 1427)     LOS: 2 days     Cordelia Poche, MD [facility] 02/23/2018, 4:34 PM  If 7PM-7AM, please contact night-coverage [email or web address]

## 2018-02-24 ENCOUNTER — Inpatient Hospital Stay (HOSPITAL_COMMUNITY): Payer: Medicare Other

## 2018-02-24 DIAGNOSIS — R410 Disorientation, unspecified: Secondary | ICD-10-CM

## 2018-02-24 MED ORDER — LORAZEPAM 2 MG/ML IJ SOLN: 0.5000 mg | Freq: Four times a day (QID) | INTRAMUSCULAR | Status: DC | PRN

## 2018-02-24 MED ORDER — HYDRALAZINE HCL 20 MG/ML IJ SOLN
5.0000 mg | Freq: Four times a day (QID) | INTRAMUSCULAR | Status: DC | PRN
  Administered 2018-02-26: 5 mg via INTRAVENOUS
  Filled 2018-02-24: qty 1

## 2018-02-24 MED ORDER — LORAZEPAM 2 MG/ML IJ SOLN
0.5000 mg | Freq: Once | INTRAMUSCULAR | Status: AC
  Administered 2018-02-24: 0.5 mg via INTRAVENOUS
  Filled 2018-02-24: qty 1

## 2018-02-24 NOTE — Progress Notes (Signed)
Pt had sitter that was assigned until 3 AM , staffing has no one available to send from 3 to 7 am . As soon as sitter left pt got out of bed trying to go home,pt will not follow simple commands there is no PRN for pt, K Schorr on call notified will continue to monitor.

## 2018-02-24 NOTE — Progress Notes (Signed)
PT Cancellation Note  Patient Details Name: Cathy [name] MRN: [id number] DOB: [birth year]   Cancelled Treatment:    Reason Eval/Treat Not Completed: Fatigue/lethargy limiting ability to participate. PT eval attempted x 3 today. Pt sleeping on all attempts. Pt awake and agitated earlier this morning requiring ativan. PT to re-attempt as time allows.    Cathy [name] 02/24/2018, 1:18 PM   Cathy [name], PT  Office # [phone] Pager [phone]

## 2018-02-24 NOTE — Progress Notes (Signed)
PROGRESS NOTE    [name]  VQM:[id number] DOB: [birth year] DOA: 02/20/2018 PCP: Biagio Borg, MD   Brief Narrative: Cathy [name] is a 84 y.o. femalewith a PMH of multiple medical problems including known hiatal hernia, chronic systolic CHF with an EF of 25-30% in 2016, but improved to 55-60% in 2018, hypertension, hyperlipidemia and diverticulosis. She presented with abdominal pain and found to have a paraesophageal hernia containing stomach and colon with concern for partial bowel obstruction. Non-operative management.   Assessment & Plan:   Principal Problem:   Bowel obstruction (HCC) Active Problems:   Essential hypertension   Abnormal CT of the abdomen   CAD (coronary artery disease), native coronary artery   Chronic combined systolic and diastolic CHF (congestive heart failure) (HCC)   Sinus bradycardia   Hypothyroidism   Paraesophageal hernia Containing stomach and colon with concern for partial bowel obstruction. General surgery evaluated and plan for conservative, non-operative management. -General surgery recommendations: Advance diet as tolerated  Essential hypertension On Imdur as an outpatient. -Continue Imdur -Hydralazine prn  Atrial fibrillation -Continue amiodarone  Sinus bradycardia Asymptomatic.  Hypothyroidism Started on Synthroid 50 mcg as an outpatient. Increased to 75 mcg inpatient on admission -Continue 62.5 mcg daily in AM and on discharge -Outpatient TSH recheck in 5-6 weeks  Right sided pleural effusion Chronic since one year ago. Asymptomatic. No symptoms concerning for pneumonia. -Outpatient follow-up  CAD -Continue Lipitor, Imdur  Chronic heart failure with preserved EF Compensated. No evidence of systolic dysfunction on recent Transthoracic Echocardiogram. Follows with cardiology as an outpatient. On Lasix as an outpatient. -Daily weights  UTI ESBL e. Coli. Patient with polyuria but somewhat chronic. Does have some  confusion but no evidence of systemic disease. No pyelonephritis on CT scan. Allergy to sulfonamides -Zosyn IV  Abdominal fullness -Check bladder scan -Abdominal x-ray  Delirium Unsure of etiology. Possibly hospital related vs secondary to pain/infection. Also could be secondary to lack of sleep -Trazodone qhs if QTc improved -Ativan prn only if significant agitation to the point of an unsafe situation (fall risk, combative, etc)   DVT prophylaxis: Eliquis Code Status:   Code Status: Full Code Family Communication: Daughter at bedside Disposition Plan: Discharge pending improvement of symptoms and PT evaluation   Consultants:   General surgery  Procedures:   None  Antimicrobials:  Zosyn (12/26>>    Subjective: Patient without sleep overnight. Agitated during the day. Constantly attempting to get up out of bed.  Objective: Vitals:   02/23/18 2058 02/24/18 0517 02/24/18 0648 02/24/18 0958  BP: (!) 183/108 (!) 163/88  (!) 198/93  Pulse: (!) 53 62  66  Resp:  20  (!) 24  Temp: 98.2 F (36.8 C) 98.2 F (36.8 C)  98.3 F (36.8 C)  TempSrc: Oral Oral  Oral  SpO2: 93% 96%  97%  Weight:   49.9 kg   Height:        Intake/Output Summary (Last 24 hours) at 02/24/2018 1403 Last data filed at 02/24/2018 1252 Gross per 24 hour  Intake 303.89 ml  Output 600 ml  Net -296.11 ml   Filed Weights   02/20/18 2131 02/21/18 2019 02/24/18 0648  Weight: 50.8 kg 50.7 kg 49.9 kg    Examination:  General exam: Appears calm and comfortable Respiratory system: Clear to auscultation. Respiratory effort normal. Cardiovascular system: S1 & S2 heard, RRR. No murmurs, rubs, gallops or clicks. Gastrointestinal system: Abdomen is nondistended, soft and nontender. Some fullness noted in lower quadrant. Normal bowel  sounds heard. Central nervous system: Alert. No focal neurological deficits. Extremities: No edema. No calf tenderness Skin: No cyanosis. No rashes Psychiatry: Judgement  and insight appear imparied. Mood & affect appropriate. .     Data Reviewed: I have personally reviewed following labs and imaging studies  CBC: Recent Labs  Lab 02/20/18 2146 02/22/18 0603  WBC 11.3* 8.0  HGB 11.7* 11.6*  HCT 36.9 35.8*  MCV 93.4 94.0  PLT 167 962*   Basic Metabolic Panel: Recent Labs  Lab 02/20/18 2146 02/22/18 0603  NA 141 141  K 3.4* 3.5  CL 105 105  CO2 27 26  GLUCOSE 126* 71  BUN 14 10  CREATININE 0.90 0.75  CALCIUM 8.9 8.6*   GFR: Estimated Creatinine Clearance: 33.2 mL/min (by C-G formula based on SCr of 0.75 mg/dL). Liver Function Tests: Recent Labs  Lab 02/20/18 2146  AST 26  ALT 21  ALKPHOS 49  BILITOT 0.7  PROT 5.5*  ALBUMIN 3.1*   Recent Labs  Lab 02/20/18 2146  LIPASE 73*   No results for input(s): AMMONIA in the last 168 hours. Coagulation Profile: No results for input(s): INR, PROTIME in the last 168 hours. Cardiac Enzymes: Recent Labs  Lab 02/21/18 0534  TROPONINI <0.03   BNP (last 3 results) No results for input(s): PROBNP in the last 8760 hours. HbA1C: No results for input(s): HGBA1C in the last 72 hours. CBG: No results for input(s): GLUCAP in the last 168 hours. Lipid Profile: No results for input(s): CHOL, HDL, LDLCALC, TRIG, CHOLHDL, LDLDIRECT in the last 72 hours. Thyroid Function Tests: No results for input(s): TSH, T4TOTAL, FREET4, T3FREE, THYROIDAB in the last 72 hours. Anemia Panel: No results for input(s): VITAMINB12, FOLATE, FERRITIN, TIBC, IRON, RETICCTPCT in the last 72 hours. Sepsis Labs: No results for input(s): PROCALCITON, LATICACIDVEN in the last 168 hours.  Recent Results (from the past 240 hour(s))  Urine culture     Status: Abnormal   Collection Time: 02/21/18  7:12 AM  Result Value Ref Range Status   Specimen Description URINE, RANDOM  Final   Special Requests   Final    NONE Performed at [facility], [address] [place], [place] [address]    Culture (A)  Final     >=100,000 COLONIES/mL ESCHERICHIA COLI Confirmed Extended Spectrum Beta-Lactamase Producer (ESBL).  In bloodstream infections from ESBL organisms, carbapenems are preferred over piperacillin/tazobactam. They are shown to have a lower risk of mortality.    Report Status 02/23/2018 FINAL  Final   Organism ID, Bacteria ESCHERICHIA COLI (A)  Final      Susceptibility   Escherichia coli - MIC*    AMPICILLIN >=32 RESISTANT Resistant     CEFAZOLIN >=64 RESISTANT Resistant     CEFTRIAXONE >=64 RESISTANT Resistant     CIPROFLOXACIN >=4 RESISTANT Resistant     GENTAMICIN <=1 SENSITIVE Sensitive     IMIPENEM <=0.25 SENSITIVE Sensitive     NITROFURANTOIN <=16 SENSITIVE Sensitive     TRIMETH/SULFA <=20 SENSITIVE Sensitive     AMPICILLIN/SULBACTAM >=32 RESISTANT Resistant     PIP/TAZO 8 SENSITIVE Sensitive     Extended ESBL POSITIVE Resistant     * >=100,000 COLONIES/mL ESCHERICHIA COLI         Radiology Studies: Dg Abd 2 Views  Result Date: 02/22/2018 CLINICAL DATA:  Hiatal hernia, irritable bowel syndrome, hypertension EXAM: ABDOMEN - 2 VIEW COMPARISON:  CT abdomen and pelvis 02/21/2018 FINDINGS: Large hiatal hernia containing stomach and colon. Bowel gas pattern otherwise  normal. Retained contrast in distal colon. No bowel dilatation or bowel wall thickening. Osseous demineralization with degenerative disc and facet disease changes of the thoracolumbar spine associated with biconvex scoliosis. Surgical clips in RIGHT upper quadrant from cholecystectomy. Bibasilar pleural effusions and atelectasis. IMPRESSION: Large hiatal hernia containing stomach and colon. Otherwise normal bowel gas pattern. Bibasilar pleural effusions and atelectasis. Electronically Signed   By: Lavonia Dana M.D.   On: 02/22/2018 16:32   Dg Abd Portable 1v  Result Date: 02/24/2018 CLINICAL DATA:  Diarrhea, questionable constipation EXAM: PORTABLE ABDOMEN - 1 VIEW COMPARISON:  02/22/2018 FINDINGS: Prior cholecystectomy.  Nonobstructive bowel gas pattern. No significant increase in stool burden. No organomegaly or suspicious calcification. No free air. Small bilateral effusions with bibasilar atelectasis or infiltrates. IMPRESSION: No evidence of bowel obstruction or free air. Small bilateral effusions with bibasilar atelectasis or infiltrates. Electronically Signed   By: Rolm Baptise M.D.   On: 02/24/2018 11:44        Scheduled Meds: . amiodarone  200 mg Oral Daily  . apixaban  2.5 mg Oral BID  . isosorbide mononitrate  30 mg Oral Daily  . levothyroxine  62.5 mcg Oral Q0600   Continuous Infusions: . piperacillin-tazobactam (ZOSYN)  IV 3.375 g (02/24/18 0174)     LOS: 3 days     Cordelia Poche, MD [facility] 02/24/2018, 2:03 PM  If 7PM-7AM, please contact night-coverage [email or web address]

## 2018-02-24 NOTE — Progress Notes (Signed)
MD stated to perform bladder scan on patient. Patient voided 600 cc. Bladder scan preformed and patient has 252 cc of urine in bladder. MD notified and stated to monitor urine output and preform bladder scan as needed. Will continue to monitor.

## 2018-02-25 ENCOUNTER — Other Ambulatory Visit: Payer: Self-pay | Admitting: Cardiology

## 2018-02-25 DIAGNOSIS — I4892 Unspecified atrial flutter: Secondary | ICD-10-CM

## 2018-02-25 DIAGNOSIS — K449 Diaphragmatic hernia without obstruction or gangrene: Secondary | ICD-10-CM

## 2018-02-25 LAB — BASIC METABOLIC PANEL
ANION GAP: 13 (ref 5–15)
BUN: <5 mg/dL — ABNORMAL LOW (ref 8–23)
CO2: 26 mmol/L (ref 22–32)
Calcium: 8.5 mg/dL — ABNORMAL LOW (ref 8.9–10.3)
Chloride: 100 mmol/L (ref 98–111)
Creatinine, Ser: 0.87 mg/dL (ref 0.44–1.00)
GFR calc Af Amer: >60 mL/min (ref >60)
GFR calc non Af Amer: 59 mL/min — ABNORMAL LOW (ref >60)
Glucose, Bld: 77 mg/dL (ref 70–99)
Potassium: 2.9 mmol/L — ABNORMAL LOW (ref 3.5–5.1)
Sodium: 139 mmol/L (ref 135–145)

## 2018-02-25 LAB — CBC
HCT: 34.6 % — ABNORMAL LOW (ref 36.0–46.0)
Hemoglobin: 11.4 g/dL — ABNORMAL LOW (ref 12.0–15.0)
MCH: 29.5 pg (ref 26.0–34.0)
MCHC: 32.9 g/dL (ref 30.0–36.0)
MCV: 89.6 fL (ref 80.0–100.0)
Platelets: 142 10*3/uL — ABNORMAL LOW (ref 150–400)
RBC: 3.86 MIL/uL — AB (ref 3.87–5.11)
RDW: 14.1 % (ref 11.5–15.5)
WBC: 8.6 10*3/uL (ref 4.0–10.5)
nRBC: 0 % (ref 0.0–0.2)

## 2018-02-25 LAB — MAGNESIUM: MAGNESIUM: 1.8 mg/dL (ref 1.7–2.4)

## 2018-02-25 MED ORDER — ISOSORBIDE MONONITRATE ER 60 MG PO TB24
60.0000 mg | ORAL_TABLET | Freq: Every day | ORAL | Status: DC
  Administered 2018-02-26 – 2018-03-02 (×5): 60 mg via ORAL
  Filled 2018-02-25 (×6): qty 1

## 2018-02-25 NOTE — Evaluation (Signed)
Physical Therapy Evaluation Patient Details Name: Cathy [name] MRN: [id number] DOB: 1927-11-21 Today's Date: 02/25/2018   History of Present Illness  Pt is an 84 y.o. female with a PMH of multiple medical problems including known hiatal hernia, chronic systolic CHF with an EF of 25-30% in 2016, but improved to 55-60% in 2018, hypertension, hyperlipidemia and diverticulosis. She presented to the ED with a 24-hour history of abdominal pain associated with nausea. Her workup in the ED revealed a UTI in addition to pleural effusions, and a massive paraesophageal hernia containing stomach and transverse colon with possible partial obstruction.   Clinical Impression  PTA pt reports living alone in 2 story home with 4 steps to enter and bed/bath on 1st floor. Pt states she is independent in household ambulation with cane/RW and is able to perform ADLs. Pt granddaughter does her grocery shopping and takes her to her appointments. Pt states daughter/granddaughter are available for 24hr care at d/c if needed. Pt currently limited in safe mobility by decreased strength, balance and endurance. Pt requires min guard-minA for bed mobility, and min guard for transfers and ambulation of 25 feet with RW. PT recommends HHPT level rehab to work on strength and balance at discharge if pt family can provide initial 24 hour care. PT will continue to follow acutely.    Follow Up Recommendations Home health PT;Supervision/Assistance - 24 hour    Equipment Recommendations  None recommended by PT       Precautions / Restrictions Precautions Precautions: Fall Precaution Comments: hx of falls Restrictions Weight Bearing Restrictions: No      Mobility  Bed Mobility Overal bed mobility: Needs Assistance Bed Mobility: Supine to Sit     Supine to sit: Min assist;Min guard     General bed mobility comments: min guard for coming to upright with use of bedrail, minA for pad scoot of hips to EoB, after pt attempted  to scoot hips forward  Transfers Overall transfer level: Needs assistance Equipment used: Rolling walker (2 wheeled) Transfers: Sit to/from Stand Sit to Stand: Min guard         General transfer comment: min guard for safety, good hand placement for power up before reaching to RW from bed and from toilet  Ambulation/Gait Ambulation/Gait assistance: Min guard Gait Distance (Feet): 25 Feet Assistive device: Rolling walker (2 wheeled) Gait Pattern/deviations: Step-through pattern;Decreased step length - right;Decreased step length - left;Shuffle;Trunk flexed Gait velocity: slowed Gait velocity interpretation: <1.31 ft/sec, indicative of household ambulator General Gait Details: min guard for safety, slow, mildly unsteady gait, vc for managment of RW around obstacles in room, and for upright posture     Balance Overall balance assessment: Needs assistance Sitting-balance support: Feet supported;No upper extremity supported Sitting balance-Leahy Scale: Fair     Standing balance support: No upper extremity supported;During functional activity Standing balance-Leahy Scale: Fair                               Pertinent Vitals/Pain Pain Assessment: No/denies pain    Home Living Family/patient expects to be discharged to:: Private residence Living Arrangements: Alone Available Help at Discharge: Family;Available 24 hours/day(Pt's daughter and daughter in law able to provide 24-hour assist.) Type of Home: House Home Access: Stairs to enter Entrance Stairs-Rails: Psychiatric nurse of Steps: 3 Home Layout: One level Home Equipment: Environmental consultant - 2 wheels;Cane - single point;Bedside commode      Prior Function Level of Independence: Independent with  assistive device(s)         Comments: cane vs RW for ambulation     Hand Dominance   Dominant Hand: Right    Extremity/Trunk Assessment   Upper Extremity Assessment Upper Extremity Assessment:  Generalized weakness    Lower Extremity Assessment Lower Extremity Assessment: Generalized weakness    Cervical / Trunk Assessment Cervical / Trunk Assessment: Kyphotic  Communication   Communication: HOH  Cognition Arousal/Alertness: Lethargic Behavior During Therapy: Flat affect Overall Cognitive Status: No family/caregiver present to determine baseline cognitive functioning                                 General Comments: Pt with slow processing, and need for repeation of cuing      General Comments General comments (skin integrity, edema, etc.): Pt is somewhat lethargic throughout evaluation, however able to answer questions appropriately.         Assessment/Plan    PT Assessment Patient needs continued PT services  PT Problem List Decreased activity tolerance;Decreased balance;Decreased strength       PT Treatment Interventions DME instruction;Gait training;Stair training;Functional mobility training;Therapeutic activities;Therapeutic exercise;Balance training;Cognitive remediation;Patient/family education    PT Goals (Current goals can be found in the Care Plan section)  Acute Rehab PT Goals Patient Stated Goal: get in touch with daughter PT Goal Formulation: With patient Time For Goal Achievement: 03/11/18 Potential to Achieve Goals: Fair    Frequency Min 3X/week   Barriers to discharge Decreased caregiver support         AM-PAC PT "6 Clicks" Mobility  Outcome Measure Help needed turning from your back to your side while in a flat bed without using bedrails?: A Little Help needed moving from lying on your back to sitting on the side of a flat bed without using bedrails?: A Little Help needed moving to and from a bed to a chair (including a wheelchair)?: A Little Help needed standing up from a chair using your arms (e.g., wheelchair or bedside chair)?: A Little Help needed to walk in hospital room?: A Little Help needed climbing 3-5 steps  with a railing? : A Lot 6 Click Score: 17    End of Session Equipment Utilized During Treatment: Gait belt Activity Tolerance: Patient tolerated treatment well Patient left: in chair;with call bell/phone within reach;with chair alarm set Nurse Communication: Mobility status;Other (comment)(requests help with calling her daughter) PT Visit Diagnosis: Unsteadiness on feet (R26.81);Other abnormalities of gait and mobility (R26.89);History of falling (Z91.81);Muscle weakness (generalized) (M62.81);Difficulty in walking, not elsewhere classified (R26.2)    Time: 3300-7622 PT Time Calculation (min) (ACUTE ONLY): 27 min   Charges:   PT Evaluation $PT Eval Moderate Complexity: 1 Mod PT Treatments $Therapeutic Activity: 8-22 mins        Winter Trefz B. Migdalia Dk PT, DPT Acute Rehabilitation Services Pager [phone] Office [phone]   [place] 02/25/2018, 9:37 AM

## 2018-02-25 NOTE — Progress Notes (Signed)
PROGRESS NOTE    Cathy [name]  MBT:[id number] DOB: 02-20-1928 DOA: 02/20/2018 PCP: Biagio Borg, MD   Brief Narrative: Cathy [name] is a 84 y.o. femalewith a PMH of multiple medical problems including known hiatal hernia, chronic systolic CHF with an EF of 25-30% in 2016, but improved to 55-60% in 2018, hypertension, hyperlipidemia and diverticulosis. She presented with abdominal pain and found to have a paraesophageal hernia containing stomach and colon with concern for partial bowel obstruction. Non-operative management.   Assessment & Plan:   Principal Problem:   Bowel obstruction (HCC) Active Problems:   Essential hypertension   Abnormal CT of the abdomen   CAD (coronary artery disease), native coronary artery   Chronic combined systolic and diastolic CHF (congestive heart failure) (HCC)   Sinus bradycardia   Hypothyroidism   Paraesophageal hernia Containing stomach and colon with concern for partial bowel obstruction. General surgery evaluated and plan for conservative, non-operative management. -General surgery recommendations: Advance diet as tolerated  Essential hypertension On Imdur as an outpatient. -Increase to Imdur 60mg  daily -Hydralazine prn  Atrial fibrillation -Continue amiodarone  Sinus bradycardia Asymptomatic.  Hypothyroidism Started on Synthroid 50 mcg as an outpatient. Increased to 75 mcg inpatient on admission -Continue 62.5 mcg daily in AM and on discharge -Outpatient TSH recheck in 5-6 weeks  Right sided pleural effusion Chronic since one year ago. Asymptomatic. No symptoms concerning for pneumonia. -Outpatient follow-up  CAD -Continue Lipitor, Imdur  Chronic heart failure with preserved EF Compensated. No evidence of systolic dysfunction on recent Transthoracic Echocardiogram. Follows with cardiology as an outpatient. On Lasix as an outpatient. -Daily weights  UTI ESBL e. Coli. Patient with polyuria but somewhat chronic. Does have  some confusion but no evidence of systemic disease. No pyelonephritis on CT scan. Allergy to sulfonamides. -Zosyn IV  Abdominal fullness Improved with voiding. Abdominal x-ray unremarkable.  Delirium Unsure of etiology. Possibly hospital related vs secondary to pain/infection. Also could be secondary to lack of sleep -Ativan prn only if significant agitation to the point of an unsafe situation (fall risk, combative, etc)  Prolonged QTc In setting of bundle branch block. Avoid QT prolonging agents   DVT prophylaxis: Eliquis Code Status:   Code Status: Full Code Family Communication: Daughter at bedside Disposition Plan: Discharge home with [facility] pending improvement of symptoms and completion of IV antibiotics   Consultants:   General surgery  Procedures:   None  Antimicrobials:  Zosyn (12/26>>    Subjective: No concerns per patient.  Objective: Vitals:   02/24/18 0958 02/24/18 1635 02/25/18 0559 02/25/18 0840  BP: (!) 198/93 (!) 167/82 (!) 174/84 (!) 175/69  Pulse: 66 (!) 56 (!) 48 (!) 59  Resp: (!) 24 16 16 16   Temp: 98.3 F (36.8 C) 99.1 F (37.3 C) 98.3 F (36.8 C) 98.1 F (36.7 C)  TempSrc: Oral Oral Oral Oral  SpO2: 97% 96% 95% 98%  Weight:      Height:        Intake/Output Summary (Last 24 hours) at 02/25/2018 1353 Last data filed at 02/25/2018 1308 Gross per 24 hour  Intake 144.29 ml  Output 700 ml  Net -555.71 ml   Filed Weights   02/20/18 2131 02/21/18 2019 02/24/18 0648  Weight: 50.8 kg 50.7 kg 49.9 kg    Examination:  General exam: Appears calm and comfortable Respiratory system: Clear to auscultation. Respiratory effort normal. Cardiovascular system: S1 & S2 heard, RRR. No murmurs, rubs, gallops or clicks. Gastrointestinal system: Abdomen is nondistended, soft  and nontender. No organomegaly or masses felt. Normal bowel sounds heard. Central nervous system: Alert. No focal neurological deficits. Extremities: No edema. No calf  tenderness Skin: No cyanosis. No rashes .     Data Reviewed: I have personally reviewed following labs and imaging studies  CBC: Recent Labs  Lab 02/20/18 2146 02/22/18 0603 02/25/18 0736  WBC 11.3* 8.0 8.6  HGB 11.7* 11.6* 11.4*  HCT 36.9 35.8* 34.6*  MCV 93.4 94.0 89.6  PLT 167 137* 601*   Basic Metabolic Panel: Recent Labs  Lab 02/20/18 2146 02/22/18 0603 02/25/18 0736  NA 141 141 139  K 3.4* 3.5 2.9*  CL 105 105 100  CO2 27 26 26   GLUCOSE 126* 71 77  BUN 14 10 <5*  CREATININE 0.90 0.75 0.87  CALCIUM 8.9 8.6* 8.5*  MG  --   --  1.8   GFR: Estimated Creatinine Clearance: 30.5 mL/min (by C-G formula based on SCr of 0.87 mg/dL). Liver Function Tests: Recent Labs  Lab 02/20/18 2146  AST 26  ALT 21  ALKPHOS 49  BILITOT 0.7  PROT 5.5*  ALBUMIN 3.1*   Recent Labs  Lab 02/20/18 2146  LIPASE 73*   No results for input(s): AMMONIA in the last 168 hours. Coagulation Profile: No results for input(s): INR, PROTIME in the last 168 hours. Cardiac Enzymes: Recent Labs  Lab 02/21/18 0534  TROPONINI <0.03   BNP (last 3 results) No results for input(s): PROBNP in the last 8760 hours. HbA1C: No results for input(s): HGBA1C in the last 72 hours. CBG: No results for input(s): GLUCAP in the last 168 hours. Lipid Profile: No results for input(s): CHOL, HDL, LDLCALC, TRIG, CHOLHDL, LDLDIRECT in the last 72 hours. Thyroid Function Tests: No results for input(s): TSH, T4TOTAL, FREET4, T3FREE, THYROIDAB in the last 72 hours. Anemia Panel: No results for input(s): VITAMINB12, FOLATE, FERRITIN, TIBC, IRON, RETICCTPCT in the last 72 hours. Sepsis Labs: No results for input(s): PROCALCITON, LATICACIDVEN in the last 168 hours.  Recent Results (from the past 240 hour(s))  Urine culture     Status: Abnormal   Collection Time: 02/21/18  7:12 AM  Result Value Ref Range Status   Specimen Description URINE, RANDOM  Final   Special Requests   Final    NONE Performed at  [facility], [address] [place], [place] [address]    Culture (A)  Final    >=100,000 COLONIES/mL ESCHERICHIA COLI Confirmed Extended Spectrum Beta-Lactamase Producer (ESBL).  In bloodstream infections from ESBL organisms, carbapenems are preferred over piperacillin/tazobactam. They are shown to have a lower risk of mortality.    Report Status 02/23/2018 FINAL  Final   Organism ID, Bacteria ESCHERICHIA COLI (A)  Final      Susceptibility   Escherichia coli - MIC*    AMPICILLIN >=32 RESISTANT Resistant     CEFAZOLIN >=64 RESISTANT Resistant     CEFTRIAXONE >=64 RESISTANT Resistant     CIPROFLOXACIN >=4 RESISTANT Resistant     GENTAMICIN <=1 SENSITIVE Sensitive     IMIPENEM <=0.25 SENSITIVE Sensitive     NITROFURANTOIN <=16 SENSITIVE Sensitive     TRIMETH/SULFA <=20 SENSITIVE Sensitive     AMPICILLIN/SULBACTAM >=32 RESISTANT Resistant     PIP/TAZO 8 SENSITIVE Sensitive     Extended ESBL POSITIVE Resistant     * >=100,000 COLONIES/mL ESCHERICHIA COLI         Radiology Studies: Dg Abd Portable 1v  Result Date: 02/24/2018 CLINICAL DATA:  Diarrhea, questionable constipation EXAM: PORTABLE  ABDOMEN - 1 VIEW COMPARISON:  02/22/2018 FINDINGS: Prior cholecystectomy. Nonobstructive bowel gas pattern. No significant increase in stool burden. No organomegaly or suspicious calcification. No free air. Small bilateral effusions with bibasilar atelectasis or infiltrates. IMPRESSION: No evidence of bowel obstruction or free air. Small bilateral effusions with bibasilar atelectasis or infiltrates. Electronically Signed   By: Rolm Baptise M.D.   On: 02/24/2018 11:44        Scheduled Meds: . amiodarone  200 mg Oral Daily  . apixaban  2.5 mg Oral BID  . [START ON 02/26/2018] isosorbide mononitrate  60 mg Oral Daily  . levothyroxine  62.5 mcg Oral Q0600   Continuous Infusions: . piperacillin-tazobactam (ZOSYN)  IV 3.375 g (02/25/18 1350)     LOS: 4 days     Cordelia Poche,  MD [facility] 02/25/2018, 1:53 PM  If 7PM-7AM, please contact night-coverage [email or web address]

## 2018-02-26 NOTE — Progress Notes (Signed)
Physical Therapy Treatment Patient Details Name: Cathy [name] MRN: [id number] DOB: [birth year] Today's Date: 02/26/2018    History of Present Illness Pt is an 84 y.o. female with a PMH of multiple medical problems including known hiatal hernia, chronic systolic CHF with an EF of 25-30% in 2016, but improved to 55-60% in 2018, hypertension, hyperlipidemia and diverticulosis. She presented to the ED with a 24-hour history of abdominal pain associated with nausea. Her workup in the ED revealed a UTI in addition to pleural effusions, and a massive paraesophageal hernia containing stomach and transverse colon with possible partial obstruction.     PT Comments    Pt continues to require assist for mobility and remains confused. Unsure of cognitive baseline but at this time feel pt needs 24 hour assist. If family is willing and able to provide this she could return home but otherwise will need SNF.    Follow Up Recommendations  Supervision/Assistance - 24 hour;SNF     Equipment Recommendations  None recommended by PT    Recommendations for Other Services       Precautions / Restrictions Precautions Precautions: Fall Precaution Comments: hx of falls Restrictions Weight Bearing Restrictions: No    Mobility  Bed Mobility               General bed mobility comments: Pt sitting up on EOB with bed alarm going off  Transfers Overall transfer level: Needs assistance Equipment used: Rolling walker (2 wheeled);4-wheeled walker Transfers: Sit to/from Stand Sit to Stand: Min assist         General transfer comment: Assist to bring hips up from low surface  Ambulation/Gait Ambulation/Gait assistance: Min assist Gait Distance (Feet): 90 Feet Assistive device: Rolling walker (2 wheeled);4-wheeled walker Gait Pattern/deviations: Step-through pattern;Decreased step length - right;Decreased step length - left;Shuffle;Trunk flexed Gait velocity: slowed Gait velocity interpretation:  <1.31 ft/sec, indicative of household ambulator General Gait Details: Assist for balance and safety. Assist to navigate walker around obstacles   Stairs             Wheelchair Mobility    Modified Rankin (Stroke Patients Only)       Balance Overall balance assessment: Needs assistance Sitting-balance support: Feet supported;No upper extremity supported Sitting balance-Leahy Scale: Fair     Standing balance support: No upper extremity supported;During functional activity Standing balance-Leahy Scale: Fair                              Cognition Arousal/Alertness: Awake/alert Behavior During Therapy: WFL for tasks assessed/performed Overall Cognitive Status: No family/caregiver present to determine baseline cognitive functioning Area of Impairment: Orientation;Memory;Safety/judgement;Problem solving                 Orientation Level: Disoriented to;Place;Time;Situation   Memory: Decreased short-term memory   Safety/Judgement: Decreased awareness of safety   Problem Solving: Slow processing;Requires verbal cues        Exercises      General Comments        Pertinent Vitals/Pain Pain Assessment: No/denies pain    Home Living Family/patient expects to be discharged to:: Private residence Living Arrangements: Alone Available Help at Discharge: Family;Available 24 hours/day(Pt's daughter and daughter in law able to provide 24-hour assist.) Type of Home: House Home Access: Stairs to enter Entrance Stairs-Rails: Right;Left Home Layout: One level Home Equipment: Environmental consultant - 2 wheels;Cane - single point;Bedside commode      Prior Function Level of Independence: Independent with assistive  device(s)      Comments: cane vs RW for ambulation   PT Goals (current goals can now be found in the care plan section) Progress towards PT goals: Progressing toward goals    Frequency    Min 3X/week      PT Plan Discharge plan needs to be updated     Co-evaluation              AM-PAC PT "6 Clicks" Mobility   Outcome Measure  Help needed turning from your back to your side while in a flat bed without using bedrails?: A Little Help needed moving from lying on your back to sitting on the side of a flat bed without using bedrails?: A Little Help needed moving to and from a bed to a chair (including a wheelchair)?: A Little Help needed standing up from a chair using your arms (e.g., wheelchair or bedside chair)?: A Little Help needed to walk in hospital room?: A Little Help needed climbing 3-5 steps with a railing? : A Lot 6 Click Score: 17    End of Session Equipment Utilized During Treatment: Gait belt Activity Tolerance: Patient tolerated treatment well Patient left: in chair;with call bell/phone within reach;with chair alarm set Nurse Communication: Mobility status PT Visit Diagnosis: Unsteadiness on feet (R26.81);Other abnormalities of gait and mobility (R26.89);History of falling (Z91.81);Muscle weakness (generalized) (M62.81);Difficulty in walking, not elsewhere classified (R26.2)     Time: 9977-4142 PT Time Calculation (min) (ACUTE ONLY): 20 min  Charges:  $Gait Training: 8-22 mins                     [place] Pager [phone] Office [place] 02/26/2018, 9:53 AM

## 2018-02-26 NOTE — Progress Notes (Signed)
02/26/18:  Completed fl2, tried to complete assessment. Patient's daughter was not present, she will be here in the morning. Would like to speak with the social worker at bedside. Was consulted for SNF, family wants to find a sitter for the patient and go home with home health.   Family would still like to speak with RNCM about home health options.   CSW will need to follow-up with family Monday morning.   Domenic Schwab, MSW, [place]

## 2018-02-26 NOTE — Progress Notes (Signed)
Pharmacy Antibiotic Note  BRISEYDA FEHR is a 84 y.o. female admitted on 02/20/2018 with ESBL UTI.  Pharmacy consulted 12/24 for zosyn dosing. Urine cultures grew ESBL E.coli sensitive to zosyn.  MD aware to use meropenem if more than a UTI.  WBC improved 8.6, afebrile.    Plan: Continue Zosyn 3.375gm IV q8h (EI) Monitor for clinical course, LOT, and deescalation  Height: 4\' 11"  (149.9 cm) Weight: 110 lb 0.2 oz (49.9 kg) IBW/kg (Calculated) : 43.2  Temp (24hrs), Avg:98.4 F (36.9 C), Min:98.3 F (36.8 C), Max:98.5 F (36.9 C)  Recent Labs  Lab 02/20/18 2146 02/22/18 0603 02/25/18 0736  WBC 11.3* 8.0 8.6  CREATININE 0.90 0.75 0.87    Estimated Creatinine Clearance: 30.5 mL/min (by C-G formula based on SCr of 0.87 mg/dL).    Allergies  Allergen Reactions  . Amoxicillin-Pot Clavulanate Diarrhea    Has patient had a PCN reaction causing immediate rash, facial/tongue/throat swelling, SOB or lightheadedness with hypotension: no Has patient had a PCN reaction causing severe rash involving mucus membranes or skin necrosis:  no Has patient had a PCN reaction that required hospitalization: no Has patient had a PCN reaction occurring within the last 10 years no If all of the above answers are "NO", then may proceed with Cephalosporin use.   . Sulfonamide Derivatives Other (See Comments)    unknown      Microbiology results: 12/24 UCx: E.coli ESBL   Nicole Cella, RPh Clinical Pharmacist Please utilize Amion for appropriate phone number to reach the unit pharmacist ([place]) 02/26/2018 3:28 PM

## 2018-02-26 NOTE — NC FL2 (Signed)
[place] LEVEL OF CARE SCREENING TOOL     IDENTIFICATION  Patient Name: Cathy [name] Birthdate: November 28, 1927 Sex: female Admission Date (Current Location): 02/20/2018  [facility] and [place] Number:  Herbalist and Address:  The [place], [place] [address], [place], [place] [address]      Provider Number: [id number]  Attending Physician Name and Address:  Mariel Aloe, MD  Relative Name and Phone Number:  Jeanelle Malling, [phone] & Dalbert Mayotte, Daughter, [phone]    Current Level of Care: Hospital Recommended Level of Care: [place] Prior Approval Number: [id number]  Date Approved/Denied: 03/06/10 PASRR Number:    Discharge Plan: SNF    Current Diagnoses: Patient Active Problem List   Diagnosis Date Noted  . Bowel obstruction ([place]) 02/21/2018  . Hypothyroidism 02/21/2018  . Osteoarthritis of left shoulder 08/10/2017  . Pain in joint of left shoulder 08/10/2017  . Mass of joint of left wrist 06/22/2017  . Arthritis of left wrist 06/21/2017  . Carpal tunnel syndrome 06/21/2017  . Tenosynovitis of left wrist 06/21/2017  . Fracture of distal end of humerus 03/25/2017  . Closed supracondylar fracture of left humerus 01/15/2017  . Gait disorder 01/15/2017  . Recurrent falls 01/15/2017  . Rib fractures 11/28/2016  . Skin lesion 09/30/2016  . Ganglion cyst of dorsum of left wrist 09/30/2016  . Rotator cuff arthropathy, right 04/26/2016  . Hyperglycemia 04/01/2016  . Right arm pain 04/01/2016  . Wrist swelling 09/30/2015  . Preventative health care 04/02/2015  . UTI (urinary tract infection) 04/02/2015  . Chronic combined systolic and diastolic CHF (congestive heart failure) ([place]) 03/17/2015  . Coronary artery calcification seen on CT scan 03/17/2015  . Sinus bradycardia 03/17/2015  . Splenic lesion 02/18/2015  . Liver lesion 02/18/2015  . CAD (coronary artery disease), native coronary artery  02/03/2015  . Atrial flutter ([place]) 02/02/2015  . Fatigue 01/31/2015  . Abnormal CT of the abdomen 01/31/2015  . Prolonged Q-T interval on ECG 01/31/2015  . Hepatic lesion   . Constipation 12/03/2014  . Lower extremity edema 04/25/2014  . Peripheral neuropathy ([place]) 12/10/2013  . Overactive bladder 12/27/2011  . Osteoporosis 12/27/2011  . VITAMIN B12 DEFICIENCY 05/03/2010  . IRON DEFICIENCY 05/03/2010  . Anemia 05/03/2010  . VENOUS INSUFFICIENCY 01/27/2009  . BACK PAIN, LUMBAR 01/27/2008  . URINARY INCONTINENCE 08/23/2007  . DIVERTICULOSIS OF COLON 04/10/2007  . HYPERCHOLESTEROLEMIA 02/13/2007  . ANXIETY 02/13/2007  . Essential hypertension 02/13/2007  . HIATAL HERNIA 02/13/2007  . Irritable bowel syndrome 02/13/2007  . DEGENERATIVE JOINT DISEASE 02/13/2007  . Allergic rhinitis 02/13/2007    Orientation RESPIRATION BLADDER Height & Weight     Self  Normal Continent Weight: 110 lb 0.2 oz (49.9 kg) Height:  4\' 11"  (149.9 cm)  BEHAVIORAL SYMPTOMS/MOOD NEUROLOGICAL BOWEL NUTRITION STATUS      Incontinent Diet(Soft foods)  AMBULATORY STATUS COMMUNICATION OF NEEDS Skin   Independent Verbally Normal(Skin tear on right leg)                       Personal Care Assistance Level of Assistance  Bathing, Feeding, Dressing, Total care Bathing Assistance: Limited assistance Feeding assistance: Independent Dressing Assistance: Limited assistance Total Care Assistance: Limited assistance   Functional Limitations Info  Sight, Hearing, Speech Sight Info: Adequate Hearing Info: Adequate Speech Info: Adequate    SPECIAL CARE FACTORS FREQUENCY  PT (By licensed PT), OT (By licensed OT)     PT  Frequency: 5x/wk OT Frequency: 5x/wk            Contractures Contractures Info: Not present    Additional Factors Info  Code Status, Allergies Code Status Info: Full Code Allergies Info: Amoxicillin-pot Clavulanate, Sulfonamide Derivatives           Current Medications  (02/26/2018):  This is the current hospital active medication list Current Facility-Administered Medications  Medication Dose Route Frequency Provider Last Rate Last Dose  . amiodarone (PACERONE) tablet 200 mg  200 mg Oral Daily Lore, Melissa A, RPH   200 mg at 02/26/18 0918  . apixaban (ELIQUIS) tablet 2.5 mg  2.5 mg Oral BID Mariel Aloe, MD   2.5 mg at 02/26/18 5015  . hydrALAZINE (APRESOLINE) injection 5 mg  5 mg Intravenous Q6H PRN Mariel Aloe, MD   5 mg at 02/26/18 8682  . isosorbide mononitrate (IMDUR) 24 hr tablet 60 mg  60 mg Oral Daily Mariel Aloe, MD   60 mg at 02/26/18 5749  . levothyroxine (SYNTHROID, LEVOTHROID) tablet 62.5 mcg  62.5 mcg Oral Q0600 Mariel Aloe, MD   62.5 mcg at 02/26/18 0644  . LORazepam (ATIVAN) injection 0.5 mg  0.5 mg Intravenous Q6H PRN Mariel Aloe, MD      . piperacillin-tazobactam (ZOSYN) IVPB 3.375 g  3.375 g Intravenous Q8H Pierce, Dwayne A, RPH 12.5 mL/hr at 02/26/18 1401 3.375 g at 02/26/18 1401  . prochlorperazine (COMPAZINE) injection 10 mg  10 mg Intravenous Q4H PRN Rama, Venetia Maxon, MD         Discharge Medications: Please see discharge summary for a list of discharge medications.  Relevant Imaging Results:  Relevant Lab Results:   Additional Information SSN: [id number]  Philippa Chester Miliyah Luper, LCSWA

## 2018-02-26 NOTE — Progress Notes (Signed)
PROGRESS NOTE    [name]  MWN:[id number] DOB: 1927/09/04 DOA: 02/20/2018 PCP: Biagio Borg, MD   Brief Narrative: Cathy [name] is a 84 y.o. femalewith a PMH of multiple medical problems including known hiatal hernia, chronic systolic CHF with an EF of 25-30% in 2016, but improved to 55-60% in 2018, hypertension, hyperlipidemia and diverticulosis. She presented with abdominal pain and found to have a paraesophageal hernia containing stomach and colon with concern for partial bowel obstruction. Non-operative management.   Assessment & Plan:   Principal Problem:   Bowel obstruction (HCC) Active Problems:   Essential hypertension   Abnormal CT of the abdomen   CAD (coronary artery disease), native coronary artery   Chronic combined systolic and diastolic CHF (congestive heart failure) (HCC)   Sinus bradycardia   Hypothyroidism   Paraesophageal hernia Containing stomach and colon with concern for partial bowel obstruction. General surgery evaluated and plan for conservative, non-operative management. Asymptomatic. Having bowel movements. -General surgery recommendations: Advance diet as tolerated. No further recommendations  Essential hypertension On Imdur as an outpatient. -Continue increased dose of Imdur 60mg  daily -Hydralazine prn  Atrial fibrillation -Continue amiodarone  Sinus bradycardia Asymptomatic.  Hypothyroidism Started on Synthroid 50 mcg as an outpatient. Increased to 75 mcg inpatient on admission -Continue 62.5 mcg daily in AM and on discharge (half 125 mcg tablet) -Outpatient TSH recheck in 5-6 weeks  Right sided pleural effusion Chronic since one year ago. Asymptomatic. No symptoms concerning for pneumonia. -Outpatient follow-up  CAD -Continue Lipitor, Imdur  Chronic heart failure with preserved EF Compensated. No evidence of systolic dysfunction on recent Transthoracic Echocardiogram. Follows with cardiology as an outpatient. On Lasix as an  outpatient. -Daily weights  ESBL E. Coli UTI Patient with polyuria but somewhat chronic. Does have some confusion but no evidence of systemic disease. No pyelonephritis on CT scan. Allergy to sulfonamides. -Zosyn IV  Abdominal fullness Improved with voiding. Abdominal x-ray unremarkable.  Delirium Unsure of etiology. Possibly hospital related vs secondary to pain/infection. Also could be secondary to lack of sleep -Ativan prn only if significant agitation to the point of an unsafe situation (fall risk, combative, etc)  Prolonged QTc In setting of bundle branch block. Avoid QT prolonging agents   DVT prophylaxis: Eliquis Code Status:   Code Status: Full Code Family Communication: Daughter at bedside Disposition Plan: Discharge to SNF vs home with [facility]   Consultants:   General surgery  Procedures:   None  Antimicrobials:  Zosyn (12/26>>    Subjective: No issues per patient.  Objective: Vitals:   02/25/18 2205 02/26/18 0500 02/26/18 0518 02/26/18 0848  BP: (!) 148/81  (!) 162/108 (!) 155/74  Pulse: (!) 134  (!) 51 69  Resp: 20  16 17   Temp: 98.5 F (36.9 C)  98.4 F (36.9 C) 98.3 F (36.8 C)  TempSrc: Oral  Oral Oral  SpO2: 95%  94% 96%  Weight:  49.9 kg    Height:        Intake/Output Summary (Last 24 hours) at 02/26/2018 1201 Last data filed at 02/26/2018 0920 Gross per 24 hour  Intake 280 ml  Output 700 ml  Net -420 ml   Filed Weights   02/21/18 2019 02/24/18 0648 02/26/18 0500  Weight: 50.7 kg 49.9 kg 49.9 kg    Examination:  General exam: Appears calm and comfortable Respiratory system: Clear to auscultation. Respiratory effort normal. Cardiovascular system: S1 & S2 heard, RRR. No murmurs, rubs, gallops or clicks. Gastrointestinal system: Abdomen is  mildly distended, soft and nontender. Normal bowel sounds heard. Central nervous system: Alert and oriented to person. No focal neurological deficits. Extremities: No edema. No calf  tenderness Skin: No cyanosis. No rashes Psychiatry: Mood & affect appropriate.     Data Reviewed: I have personally reviewed following labs and imaging studies  CBC: Recent Labs  Lab 02/20/18 2146 02/22/18 0603 02/25/18 0736  WBC 11.3* 8.0 8.6  HGB 11.7* 11.6* 11.4*  HCT 36.9 35.8* 34.6*  MCV 93.4 94.0 89.6  PLT 167 137* 672*   Basic Metabolic Panel: Recent Labs  Lab 02/20/18 2146 02/22/18 0603 02/25/18 0736  NA 141 141 139  K 3.4* 3.5 2.9*  CL 105 105 100  CO2 27 26 26   GLUCOSE 126* 71 77  BUN 14 10 <5*  CREATININE 0.90 0.75 0.87  CALCIUM 8.9 8.6* 8.5*  MG  --   --  1.8   GFR: Estimated Creatinine Clearance: 30.5 mL/min (by C-G formula based on SCr of 0.87 mg/dL). Liver Function Tests: Recent Labs  Lab 02/20/18 2146  AST 26  ALT 21  ALKPHOS 49  BILITOT 0.7  PROT 5.5*  ALBUMIN 3.1*   Recent Labs  Lab 02/20/18 2146  LIPASE 73*   No results for input(s): AMMONIA in the last 168 hours. Coagulation Profile: No results for input(s): INR, PROTIME in the last 168 hours. Cardiac Enzymes: Recent Labs  Lab 02/21/18 0534  TROPONINI <0.03   BNP (last 3 results) No results for input(s): PROBNP in the last 8760 hours. HbA1C: No results for input(s): HGBA1C in the last 72 hours. CBG: No results for input(s): GLUCAP in the last 168 hours. Lipid Profile: No results for input(s): CHOL, HDL, LDLCALC, TRIG, CHOLHDL, LDLDIRECT in the last 72 hours. Thyroid Function Tests: No results for input(s): TSH, T4TOTAL, FREET4, T3FREE, THYROIDAB in the last 72 hours. Anemia Panel: No results for input(s): VITAMINB12, FOLATE, FERRITIN, TIBC, IRON, RETICCTPCT in the last 72 hours. Sepsis Labs: No results for input(s): PROCALCITON, LATICACIDVEN in the last 168 hours.  Recent Results (from the past 240 hour(s))  Urine culture     Status: Abnormal   Collection Time: 02/21/18  7:12 AM  Result Value Ref Range Status   Specimen Description URINE, RANDOM  Final   Special  Requests   Final    NONE Performed at [facility], [address], [place], [place] [address]    Culture (A)  Final    >=100,000 COLONIES/mL ESCHERICHIA COLI Confirmed Extended Spectrum Beta-Lactamase Producer (ESBL).  In bloodstream infections from ESBL organisms, carbapenems are preferred over piperacillin/tazobactam. They are shown to have a lower risk of mortality.    Report Status 02/23/2018 FINAL  Final   Organism ID, Bacteria ESCHERICHIA COLI (A)  Final      Susceptibility   Escherichia coli - MIC*    AMPICILLIN >=32 RESISTANT Resistant     CEFAZOLIN >=64 RESISTANT Resistant     CEFTRIAXONE >=64 RESISTANT Resistant     CIPROFLOXACIN >=4 RESISTANT Resistant     GENTAMICIN <=1 SENSITIVE Sensitive     IMIPENEM <=0.25 SENSITIVE Sensitive     NITROFURANTOIN <=16 SENSITIVE Sensitive     TRIMETH/SULFA <=20 SENSITIVE Sensitive     AMPICILLIN/SULBACTAM >=32 RESISTANT Resistant     PIP/TAZO 8 SENSITIVE Sensitive     Extended ESBL POSITIVE Resistant     * >=100,000 COLONIES/mL ESCHERICHIA COLI         Radiology Studies: No results found.      Scheduled Meds: .  amiodarone  200 mg Oral Daily  . apixaban  2.5 mg Oral BID  . isosorbide mononitrate  60 mg Oral Daily  . levothyroxine  62.5 mcg Oral Q0600   Continuous Infusions: . piperacillin-tazobactam (ZOSYN)  IV 3.375 g (02/26/18 2591)     LOS: 5 days     Cordelia Poche, MD [facility] 02/26/2018, 12:01 PM  If 7PM-7AM, please contact night-coverage [email or web address]

## 2018-02-27 LAB — BASIC METABOLIC PANEL
Anion gap: 11 (ref 5–15)
BUN: 9 mg/dL (ref 8–23)
CO2: 26 mmol/L (ref 22–32)
Calcium: 8.8 mg/dL — ABNORMAL LOW (ref 8.9–10.3)
Chloride: 102 mmol/L (ref 98–111)
Creatinine, Ser: 0.95 mg/dL (ref 0.44–1.00)
GFR calc non Af Amer: 53 mL/min — ABNORMAL LOW (ref >60)
Glucose, Bld: 174 mg/dL — ABNORMAL HIGH (ref 70–99)
Potassium: 2.4 mmol/L — CL (ref 3.5–5.1)
Sodium: 139 mmol/L (ref 135–145)

## 2018-02-27 LAB — POTASSIUM: Potassium: 3.8 mmol/L (ref 3.5–5.1)

## 2018-02-27 LAB — MAGNESIUM: MAGNESIUM: 1.8 mg/dL (ref 1.7–2.4)

## 2018-02-27 MED ORDER — POTASSIUM CHLORIDE 10 MEQ/100ML IV SOLN
10.0000 meq | INTRAVENOUS | Status: AC
  Administered 2018-02-27 (×2): 10 meq via INTRAVENOUS
  Filled 2018-02-27 (×2): qty 100

## 2018-02-27 MED ORDER — SODIUM CHLORIDE 0.9 % IV SOLN
INTRAVENOUS | Status: DC | PRN
  Administered 2018-02-27: 250 mL via INTRAVENOUS

## 2018-02-27 MED ORDER — POTASSIUM CHLORIDE 20 MEQ/15ML (10%) PO SOLN
40.0000 meq | ORAL | Status: DC
  Administered 2018-02-27: 40 meq via ORAL
  Filled 2018-02-27: qty 30

## 2018-02-27 MED ORDER — FUROSEMIDE 20 MG PO TABS
20.0000 mg | ORAL_TABLET | ORAL | Status: DC
  Administered 2018-02-27 – 2018-03-01 (×3): 20 mg via ORAL
  Filled 2018-02-27 (×3): qty 1

## 2018-02-27 MED ORDER — POTASSIUM CHLORIDE 20 MEQ/15ML (10%) PO SOLN
40.0000 meq | ORAL | Status: AC
  Administered 2018-02-27 (×2): 40 meq via ORAL
  Filled 2018-02-27 (×2): qty 30

## 2018-02-27 NOTE — Clinical Social Work Note (Signed)
CSW visited room and talked with family, (daughter and another family member)  regarding discharge disposition. CSW advised that patient is refusing SNF and they will take her home and need to talk with nurse case manager regarding home health services. Family also aware that they will have to pay for someone (CNA) to be with patient. Nurse case manager Abigail Butts advised. CSW signing off, however please reconsult if ant SW intervention services needed prior to discharge.  Edla Para Givens, MSW, LCSW Licensed Clinical Social Worker [place] [phone]

## 2018-02-27 NOTE — Care Management Note (Addendum)
Case Management Note Manya Silvas, RN MSN CCM Transitions of Care [address] [place] [phone]  Patient Details  Name: Cathy [name] MRN: [id number] Date of Birth: 05-20-1927  Subjective/Objective:         Bowel obstruction           Action/Plan: 02/27/2018 [place], RN MSN CCM PTA home alone. PT recommending snf, however, pt is declining snf at this time. Offered choice to pt and family. They have used [facility] in the past. Pt will need Home Health orders for PT/OT/RN/aide and face to face prior to transition home. Pt will also need 3:1. Will need DME order. Family reports that pt has a rolling walker at home. Family also states that pt has [employer] for emergency assist. Will continue to follow for transition of care needs.   Expected Discharge Date:                  Expected Discharge Plan:  Skilled Nursing Facility  In-House Referral:  Clinical Social Work  Discharge planning Services  CM Consult  Post Acute Care Choice:  Home Health, Durable Medical Equipment Choice offered to:  Patient, Adult Children  DME Arranged:  3-N-1 DME Agency:  [place]:  RN, PT, Nurse's Aide, OT [place] Agency:  [facility] (now [facility])  Status of Service:  In process, will continue to follow  If discussed at Long Length of Stay Meetings, dates discussed:    Additional Comments: 02/27/2018 [place], RN MSN CCM Received call back from [place] at [facility] stating unable to accept pt at this time. Referral made to [facility] per 2nd choice. Will f/u for transition needs.  Bartholomew Crews, RN 02/27/2018, 3:31 PM

## 2018-02-27 NOTE — Progress Notes (Signed)
PROGRESS NOTE    Cathy [name]  NID:[id number] DOB: 12/21/1927 DOA: 02/20/2018 PCP: Biagio Borg, MD   Brief Narrative: Cathy [name] is a 84 y.o. femalewith a PMH of multiple medical problems including known hiatal hernia, chronic systolic CHF with an EF of 25-30% in 2016, but improved to 55-60% in 2018, hypertension, hyperlipidemia and diverticulosis. She presented with abdominal pain and found to have a paraesophageal hernia containing stomach and colon with concern for partial bowel obstruction. Non-operative management.   Assessment & Plan:   Principal Problem:   Bowel obstruction (HCC) Active Problems:   Essential hypertension   Abnormal CT of the abdomen   CAD (coronary artery disease), native coronary artery   Chronic combined systolic and diastolic CHF (congestive heart failure) (HCC)   Sinus bradycardia   Hypothyroidism   Paraesophageal hernia Containing stomach and colon with concern for partial bowel obstruction. General surgery evaluated and plan for conservative, non-operative management. Asymptomatic. Having bowel movements. -General surgery recommendations: Advance diet as tolerated. No further recommendations  Essential hypertension On Imdur as an outpatient. -Continue increased dose of Imdur 60mg  daily -Hydralazine prn  Atrial fibrillation -Continue amiodarone  Sinus bradycardia Asymptomatic.  Hypothyroidism Started on Synthroid 50 mcg as an outpatient. Increased to 75 mcg inpatient on admission -Continue 62.5 mcg daily in AM and on discharge (half 125 mcg tablet) -Outpatient TSH recheck in 5-6 weeks  Right sided pleural effusion Chronic since one year ago. Asymptomatic. No symptoms concerning for pneumonia. -Outpatient follow-up  CAD -Continue Lipitor, Imdur  Chronic heart failure with preserved EF Compensated. No evidence of systolic dysfunction on recent Transthoracic Echocardiogram. Follows with cardiology as an outpatient. On Lasix as an  outpatient. -Daily weights  ESBL E. Coli UTI Patient with polyuria but somewhat chronic. Does have some confusion but no evidence of systemic disease. No pyelonephritis on CT scan. Allergy to sulfonamides. -Zosyn IV  Abdominal fullness Improved with voiding. Abdominal x-ray unremarkable.  Delirium Unsure of etiology. Possibly hospital related vs secondary to pain/infection. Also could be secondary to lack of sleep -Ativan prn only if significant agitation to the point of an unsafe situation (fall risk, combative, etc)  Prolonged QTc In setting of bundle branch block. Avoid QT prolonging agents   DVT prophylaxis: Eliquis Code Status:   Code Status: Full Code Family Communication: Daughter and daughter in law at bedside Disposition Plan: Discharge to SNF vs home with [facility] in 2 days pending completion of IV antibiotics   Consultants:   General surgery  Procedures:   None  Antimicrobials:  Zosyn (12/26>>    Subjective: No concerns.  Objective: Vitals:   02/26/18 2035 02/27/18 0441 02/27/18 0500 02/27/18 0936  BP: 124/75 (!) 148/93  122/74  Pulse: (!) 54 65  (!) 59  Resp: (!) 21 18  18   Temp: 97.6 F (36.4 C) 98.5 F (36.9 C)  97.6 F (36.4 C)  TempSrc: Oral Oral  Oral  SpO2: 93% 94%  95%  Weight: 52.1 kg  52.1 kg   Height:        Intake/Output Summary (Last 24 hours) at 02/27/2018 1048 Last data filed at 02/27/2018 1029 Gross per 24 hour  Intake 740 ml  Output 500 ml  Net 240 ml   Filed Weights   02/26/18 0500 02/26/18 2035 02/27/18 0500  Weight: 49.9 kg 52.1 kg 52.1 kg    Examination:  General exam: Appears calm and comfortable Respiratory system: Clear to auscultation. Respiratory effort normal. Cardiovascular system: S1 & S2 heard, RRR.  No murmurs, rubs, gallops or clicks. Gastrointestinal system: Abdomen is slightly distended, soft and nontender. Normal bowel sounds heard. Central nervous system: Alert and oriented. No focal neurological  deficits. Extremities: No edema. No calf tenderness Skin: No cyanosis. No rashes Psychiatry: Judgement and insight appear normal. Mood & affect appropriate.     Data Reviewed: I have personally reviewed following labs and imaging studies  CBC: Recent Labs  Lab 02/20/18 2146 02/22/18 0603 02/25/18 0736  WBC 11.3* 8.0 8.6  HGB 11.7* 11.6* 11.4*  HCT 36.9 35.8* 34.6*  MCV 93.4 94.0 89.6  PLT 167 137* 664*   Basic Metabolic Panel: Recent Labs  Lab 02/20/18 2146 02/22/18 0603 02/25/18 0736  NA 141 141 139  K 3.4* 3.5 2.9*  CL 105 105 100  CO2 27 26 26   GLUCOSE 126* 71 77  BUN 14 10 <5*  CREATININE 0.90 0.75 0.87  CALCIUM 8.9 8.6* 8.5*  MG  --   --  1.8   GFR: Estimated Creatinine Clearance: 33 mL/min (by C-G formula based on SCr of 0.87 mg/dL). Liver Function Tests: Recent Labs  Lab 02/20/18 2146  AST 26  ALT 21  ALKPHOS 49  BILITOT 0.7  PROT 5.5*  ALBUMIN 3.1*   Recent Labs  Lab 02/20/18 2146  LIPASE 73*   No results for input(s): AMMONIA in the last 168 hours. Coagulation Profile: No results for input(s): INR, PROTIME in the last 168 hours. Cardiac Enzymes: Recent Labs  Lab 02/21/18 0534  TROPONINI <0.03   BNP (last 3 results) No results for input(s): PROBNP in the last 8760 hours. HbA1C: No results for input(s): HGBA1C in the last 72 hours. CBG: No results for input(s): GLUCAP in the last 168 hours. Lipid Profile: No results for input(s): CHOL, HDL, LDLCALC, TRIG, CHOLHDL, LDLDIRECT in the last 72 hours. Thyroid Function Tests: No results for input(s): TSH, T4TOTAL, FREET4, T3FREE, THYROIDAB in the last 72 hours. Anemia Panel: No results for input(s): VITAMINB12, FOLATE, FERRITIN, TIBC, IRON, RETICCTPCT in the last 72 hours. Sepsis Labs: No results for input(s): PROCALCITON, LATICACIDVEN in the last 168 hours.  Recent Results (from the past 240 hour(s))  Urine culture     Status: Abnormal   Collection Time: 02/21/18  7:12 AM  Result  Value Ref Range Status   Specimen Description URINE, RANDOM  Final   Special Requests   Final    NONE Performed at [facility], [address] [place], [place] [address]    Culture (A)  Final    >=100,000 COLONIES/mL ESCHERICHIA COLI Confirmed Extended Spectrum Beta-Lactamase Producer (ESBL).  In bloodstream infections from ESBL organisms, carbapenems are preferred over piperacillin/tazobactam. They are shown to have a lower risk of mortality.    Report Status 02/23/2018 FINAL  Final   Organism ID, Bacteria ESCHERICHIA COLI (A)  Final      Susceptibility   Escherichia coli - MIC*    AMPICILLIN >=32 RESISTANT Resistant     CEFAZOLIN >=64 RESISTANT Resistant     CEFTRIAXONE >=64 RESISTANT Resistant     CIPROFLOXACIN >=4 RESISTANT Resistant     GENTAMICIN <=1 SENSITIVE Sensitive     IMIPENEM <=0.25 SENSITIVE Sensitive     NITROFURANTOIN <=16 SENSITIVE Sensitive     TRIMETH/SULFA <=20 SENSITIVE Sensitive     AMPICILLIN/SULBACTAM >=32 RESISTANT Resistant     PIP/TAZO 8 SENSITIVE Sensitive     Extended ESBL POSITIVE Resistant     * >=100,000 COLONIES/mL ESCHERICHIA COLI  Radiology Studies: No results found.      Scheduled Meds: . amiodarone  200 mg Oral Daily  . apixaban  2.5 mg Oral BID  . isosorbide mononitrate  60 mg Oral Daily  . levothyroxine  62.5 mcg Oral Q0600   Continuous Infusions: . piperacillin-tazobactam (ZOSYN)  IV 3.375 g (02/27/18 4259)     LOS: 6 days     Cordelia Poche, MD [facility] 02/27/2018, 10:48 AM  If 7PM-7AM, please contact night-coverage [email or web address]

## 2018-02-28 LAB — URINALYSIS, ROUTINE W REFLEX MICROSCOPIC
Bilirubin Urine: NEGATIVE
Glucose, UA: NEGATIVE mg/dL
Ketones, ur: NEGATIVE mg/dL
Nitrite: NEGATIVE
Protein, ur: NEGATIVE mg/dL
Specific Gravity, Urine: 1.013 (ref 1.005–1.030)
pH: 5 (ref 5.0–8.0)

## 2018-02-28 LAB — BASIC METABOLIC PANEL
ANION GAP: 7 (ref 5–15)
BUN: 9 mg/dL (ref 8–23)
CO2: 28 mmol/L (ref 22–32)
Calcium: 9.1 mg/dL (ref 8.9–10.3)
Chloride: 109 mmol/L (ref 98–111)
Creatinine, Ser: 0.96 mg/dL (ref 0.44–1.00)
GFR calc Af Amer: >60 mL/min (ref >60)
GFR calc non Af Amer: 53 mL/min — ABNORMAL LOW (ref >60)
Glucose, Bld: 92 mg/dL (ref 70–99)
Potassium: 4.3 mmol/L (ref 3.5–5.1)
Sodium: 144 mmol/L (ref 135–145)

## 2018-02-28 NOTE — Care Management Note (Signed)
Case Management Note Manya Silvas, RN MSN CCM Transitions of Care [address] [place] [phone]  Patient Details  Name: Cathy [name] MRN: [id number] Date of Birth: [birth year]  Subjective/Objective:         Bowel obstruction           Action/Plan: 02/27/2018 [place], RN MSN CCM PTA home alone. PT recommending snf, however, pt is declining snf at this time. Offered choice to pt and family. They have used [facility] in the past. Pt will need Home Health orders for PT/OT/RN/aide and face to face prior to transition home. Pt will also need 3:1. Will need DME order. Family reports that pt has a rolling walker at home. Family also states that pt has [employer] for emergency assist. Will continue to follow for transition of care needs.   Expected Discharge Date:                  Expected Discharge Plan:  Skilled Nursing Facility  In-House Referral:  Clinical Social Work  Discharge planning Services  CM Consult  Post Acute Care Choice:  Home Health, Durable Medical Equipment Choice offered to:  Patient, Adult Children  DME Arranged:  3-N-1 DME Agency:  [place]:  RN, PT, Nurse's Aide, OT HH Agency:  [facility]  Status of Service:  In process, will continue to follow  If discussed at Long Length of Stay Meetings, dates discussed:    Additional Comments: 02/28/2018 0375 Manya Silvas, RN MSN CCM Received call back from [facility] at [facility] to accept pt for [facility] services. Will need HH PT, OT, RN, Aide and face to face at discharge. Will continue to follow for transition of care needs.   02/27/2018 [place], RN MSN CCM Received call back from [place] at [facility] stating unable to accept pt at this time. Referral made to [facility] per 2nd choice. Will f/u for transition needs.  Bartholomew Crews, RN 02/28/2018, 9:37 AM

## 2018-02-28 NOTE — Progress Notes (Signed)
Physical Therapy Treatment Patient Details Name: Cathy [name] MRN: [id number] DOB: Jul 13, 1927 Today's Date: 02/28/2018    History of Present Illness Pt is an 84 y.o. female with a PMH of multiple medical problems including known hiatal hernia, chronic systolic CHF with an EF of 25-30% in 2016, but improved to 55-60% in 2018, hypertension, hyperlipidemia and diverticulosis. She presented to the ED with a 24-hour history of abdominal pain associated with nausea. Her workup in the ED revealed a UTI in addition to pleural effusions, and a massive paraesophageal hernia containing stomach and transverse colon with possible partial obstruction.     PT Comments    Pt participated well today. She tolerated activity well. Continue to recommend SNF prior to returning home alone however pt refuses placement. Family was present during session today. They stated they are working on arranging some sitter assistance. Unsure if pt will be able to have 24 hour supervision.     Follow Up Recommendations  SNF(but pt is refusing. If home, recommend 24 hour supervision/assist and HHPT)     Equipment Recommendations  None recommended by PT    Recommendations for Other Services       Precautions / Restrictions Precautions Precautions: Fall Precaution Comments: hx of falls Restrictions Weight Bearing Restrictions: No    Mobility  Bed Mobility               General bed mobility comments: Pt sitting up in chair   Transfers Overall transfer level: Needs assistance Equipment used: Rolling walker (2 wheeled) Transfers: Sit to/from Stand Sit to Stand: Min guard         General transfer comment: close guard for safety. VCs safety, hand placement  Ambulation/Gait Ambulation/Gait assistance: Min assist;Min guard Gait Distance (Feet): 90 Feet Assistive device: Rolling walker (2 wheeled) Gait Pattern/deviations: Step-through pattern;Trunk flexed;Decreased stride length     General Gait  Details: Intermittent assist to steady-mostly Min guard assist. Pt denied dizziness. She did c/o mild toe pain. She tolerated distance well   Stairs             Wheelchair Mobility    Modified Rankin (Stroke Patients Only)       Balance Overall balance assessment: Needs assistance         Standing balance support: Bilateral upper extremity supported Standing balance-Leahy Scale: Poor                              Cognition Arousal/Alertness: Awake/alert Behavior During Therapy: WFL for tasks assessed/performed Overall Cognitive Status: Impaired/Different from baseline Area of Impairment: Orientation;Memory;Following commands;Safety/judgement;Problem solving                 Orientation Level: Disoriented to;Place;Time;Situation   Memory: Decreased short-term memory Following Commands: Follows one step commands consistently Safety/Judgement: Decreased awareness of safety;Decreased awareness of deficits   Problem Solving: Requires verbal cues;Requires tactile cues        Exercises      General Comments        Pertinent Vitals/Pain Pain Assessment: No/denies pain    Home Living                      Prior Function            PT Goals (current goals can now be found in the care plan section) Progress towards PT goals: Progressing toward goals    Frequency    Min 3X/week  PT Plan Current plan remains appropriate    Co-evaluation              AM-PAC PT "6 Clicks" Mobility   Outcome Measure  Help needed turning from your back to your side while in a flat bed without using bedrails?: A Little Help needed moving from lying on your back to sitting on the side of a flat bed without using bedrails?: A Little Help needed moving to and from a bed to a chair (including a wheelchair)?: A Little Help needed standing up from a chair using your arms (e.g., wheelchair or bedside chair)?: A Little Help needed to walk in  hospital room?: A Little Help needed climbing 3-5 steps with a railing? : A Little 6 Click Score: 18    End of Session Equipment Utilized During Treatment: Gait belt Activity Tolerance: Patient tolerated treatment well Patient left: in chair;with call bell/phone within reach;with chair alarm set;with family/visitor present   PT Visit Diagnosis: Unsteadiness on feet (R26.81);Other abnormalities of gait and mobility (R26.89);History of falling (Z91.81);Muscle weakness (generalized) (M62.81);Difficulty in walking, not elsewhere classified (R26.2)     Time: 0093-8182 PT Time Calculation (min) (ACUTE ONLY): 20 min  Charges:  $Gait Training: 8-22 mins                        Weston Anna, PT Acute Rehabilitation Services Pager: [phone] Office: [phone]

## 2018-02-28 NOTE — Progress Notes (Signed)
PROGRESS NOTE    Cathy [name]  XBJ:[id number] DOB: 1927-07-28 DOA: 02/20/2018 PCP: Biagio Borg, MD   Brief Narrative: Cathy [name] is a 84 y.o. femalewith a PMH of multiple medical problems including known hiatal hernia, chronic systolic CHF with an EF of 25-30% in 2016, but improved to 55-60% in 2018, hypertension, hyperlipidemia and diverticulosis. She presented with abdominal pain and found to have a paraesophageal hernia containing stomach and colon with concern for partial bowel obstruction. Non-operative management.   Assessment & Plan:   Principal Problem:   Bowel obstruction (HCC) Active Problems:   Essential hypertension   Abnormal CT of the abdomen   CAD (coronary artery disease), native coronary artery   Chronic combined systolic and diastolic CHF (congestive heart failure) (HCC)   Sinus bradycardia   Hypothyroidism   Paraesophageal hernia Containing stomach and colon with concern for partial bowel obstruction. General surgery evaluated and plan for conservative, non-operative management. Asymptomatic. Having bowel movements. -General surgery recommendations: Advance diet as tolerated. No further recommendations  Essential hypertension On Imdur as an outpatient. -Continue increased dose of Imdur 60mg  daily -Hydralazine prn  Atrial fibrillation -Continue amiodarone  Sinus bradycardia Asymptomatic.  Hypothyroidism Started on Synthroid 50 mcg as an outpatient. Increased to 75 mcg inpatient on admission -Continue 62.5 mcg daily in AM and on discharge (half 125 mcg tablet) -Outpatient TSH recheck in 5-6 weeks  Right sided pleural effusion Chronic since one year ago. Asymptomatic. No symptoms concerning for pneumonia. -Outpatient follow-up  CAD -Continue Lipitor, Imdur  Chronic heart failure with preserved EF Compensated. No evidence of systolic dysfunction on recent Transthoracic Echocardiogram. Follows with cardiology as an outpatient. On Lasix as an  outpatient. -Daily weights  ESBL E. Coli UTI Patient with polyuria but somewhat chronic. Does have some confusion but no evidence of systemic disease. No pyelonephritis on CT scan. Allergy to sulfonamides. Completed 5 days of Zosyn.  Abdominal fullness Improved with voiding. Abdominal x-ray unremarkable. Likely related to some urinary retention. Improved.  Delirium Unsure of etiology. Possibly hospital related vs secondary to pain/infection. Improved initially. Now with some worsening confusion. Patient likely has underlying dementia -Repeat Urinalysis  Prolonged QTc In setting of bundle branch block. Avoid QT prolonging agents.   DVT prophylaxis: Eliquis Code Status:   Code Status: Full Code Family Communication: Daughter and daughter in law at bedside Disposition Plan: Discharge home in 24 hours pending improvement of confusion or negative workup   Consultants:   General surgery  Procedures:   None  Antimicrobials:  Zosyn (12/26>>12/30)   Subjective: No issues per patient  Objective: Vitals:   02/27/18 1644 02/27/18 1952 02/28/18 0549 02/28/18 0841  BP: 97/69 124/71 (!) 145/105 (!) 155/74  Pulse: (!) 54 68 81 89  Resp: 20 20 20  (!) 21  Temp: 97.7 F (36.5 C) 98.2 F (36.8 C) 98.1 F (36.7 C) 98.3 F (36.8 C)  TempSrc: Oral Oral Oral Oral  SpO2: 95% 97% 94% 95%  Weight:      Height:        Intake/Output Summary (Last 24 hours) at 02/28/2018 1602 Last data filed at 02/28/2018 1540 Gross per 24 hour  Intake 638.87 ml  Output 925 ml  Net -286.13 ml   Filed Weights   02/26/18 0500 02/26/18 2035 02/27/18 0500  Weight: 49.9 kg 52.1 kg 52.1 kg    Examination:  General exam: Appears calm and comfortable Respiratory system: Clear to auscultation. Respiratory effort normal. Cardiovascular system: S1 & S2 heard, RRR. No murmurs,  rubs, gallops or clicks. Gastrointestinal system: Abdomen is nondistended, soft and nontender. No organomegaly or masses felt.  Normal bowel sounds heard. Central nervous system: Alert and oriented to person, personal address, not to place or time or why she is in the hospital. No focal neurological deficits. Extremities: No edema. No calf tenderness Skin: No cyanosis. No rashes Psychiatry: Judgement and insight appear impaired. Mood & affect appropriate.     Data Reviewed: I have personally reviewed following labs and imaging studies  CBC: Recent Labs  Lab 02/22/18 0603 02/25/18 0736  WBC 8.0 8.6  HGB 11.6* 11.4*  HCT 35.8* 34.6*  MCV 94.0 89.6  PLT 137* 361*   Basic Metabolic Panel: Recent Labs  Lab 02/22/18 0603 02/25/18 0736 02/27/18 1125 02/27/18 1720 02/27/18 2012 02/28/18 0800  NA 141 139 139  --   --  144  K 3.5 2.9* 2.4*  --  3.8 4.3  CL 105 100 102  --   --  109  CO2 26 26 26   --   --  28  GLUCOSE 71 77 174*  --   --  92  BUN 10 <5* 9  --   --  9  CREATININE 0.75 0.87 0.95  --   --  0.96  CALCIUM 8.6* 8.5* 8.8*  --   --  9.1  MG  --  1.8  --  1.8  --   --    GFR: Estimated Creatinine Clearance: 29.9 mL/min (by C-G formula based on SCr of 0.96 mg/dL). Liver Function Tests: No results for input(s): AST, ALT, ALKPHOS, BILITOT, PROT, ALBUMIN in the last 168 hours. No results for input(s): LIPASE, AMYLASE in the last 168 hours. No results for input(s): AMMONIA in the last 168 hours. Coagulation Profile: No results for input(s): INR, PROTIME in the last 168 hours. Cardiac Enzymes: No results for input(s): CKTOTAL, CKMB, CKMBINDEX, TROPONINI in the last 168 hours. BNP (last 3 results) No results for input(s): PROBNP in the last 8760 hours. HbA1C: No results for input(s): HGBA1C in the last 72 hours. CBG: No results for input(s): GLUCAP in the last 168 hours. Lipid Profile: No results for input(s): CHOL, HDL, LDLCALC, TRIG, CHOLHDL, LDLDIRECT in the last 72 hours. Thyroid Function Tests: No results for input(s): TSH, T4TOTAL, FREET4, T3FREE, THYROIDAB in the last 72 hours. Anemia  Panel: No results for input(s): VITAMINB12, FOLATE, FERRITIN, TIBC, IRON, RETICCTPCT in the last 72 hours. Sepsis Labs: No results for input(s): PROCALCITON, LATICACIDVEN in the last 168 hours.  Recent Results (from the past 240 hour(s))  Urine culture     Status: Abnormal   Collection Time: 02/21/18  7:12 AM  Result Value Ref Range Status   Specimen Description URINE, RANDOM  Final   Special Requests   Final    NONE Performed at [facility], [address] [place], [place] [address]    Culture (A)  Final    >=100,000 COLONIES/mL ESCHERICHIA COLI Confirmed Extended Spectrum Beta-Lactamase Producer (ESBL).  In bloodstream infections from ESBL organisms, carbapenems are preferred over piperacillin/tazobactam. They are shown to have a lower risk of mortality.    Report Status 02/23/2018 FINAL  Final   Organism ID, Bacteria ESCHERICHIA COLI (A)  Final      Susceptibility   Escherichia coli - MIC*    AMPICILLIN >=32 RESISTANT Resistant     CEFAZOLIN >=64 RESISTANT Resistant     CEFTRIAXONE >=64 RESISTANT Resistant     CIPROFLOXACIN >=4 RESISTANT Resistant  GENTAMICIN <=1 SENSITIVE Sensitive     IMIPENEM <=0.25 SENSITIVE Sensitive     NITROFURANTOIN <=16 SENSITIVE Sensitive     TRIMETH/SULFA <=20 SENSITIVE Sensitive     AMPICILLIN/SULBACTAM >=32 RESISTANT Resistant     PIP/TAZO 8 SENSITIVE Sensitive     Extended ESBL POSITIVE Resistant     * >=100,000 COLONIES/mL ESCHERICHIA COLI         Radiology Studies: No results found.      Scheduled Meds: . amiodarone  200 mg Oral Daily  . apixaban  2.5 mg Oral BID  . furosemide  20 mg Oral QODAY  . isosorbide mononitrate  60 mg Oral Daily  . levothyroxine  62.5 mcg Oral Q0600   Continuous Infusions: . sodium chloride Stopped (02/28/18 0547)     LOS: 7 days     Cordelia Poche, MD [facility] 02/28/2018, 4:02 PM  If 7PM-7AM, please contact night-coverage [email or web address]

## 2018-02-28 NOTE — Evaluation (Signed)
Occupational Therapy Evaluation Patient Details Name: Cathy [name] MRN: [id number] DOB: [birth year] Today's Date: 02/28/2018    History of Present Illness Pt is an 84 y.o. female with a PMH of multiple medical problems including known hiatal hernia, chronic systolic CHF with an EF of 25-30% in 2016, but improved to 55-60% in 2018, hypertension, hyperlipidemia and diverticulosis. She presented to the ED with a 24-hour history of abdominal pain associated with nausea. Her workup in the ED revealed a UTI in addition to pleural effusions, and a massive paraesophageal hernia containing stomach and transverse colon with possible partial obstruction.    Clinical Impression   Pt admitted with above. She demonstrates the below listed deficits and will benefit from continued OT to maximize safety and independence with BADLs.  Pt presents to OT with generalized weakness, impaired balance, decreased activity tolerance, impaired cognition.   She currently requires min A for ADLs and functional mobility.   Pt reports that PTA, she was mod I with ADLs and lived alone.  She will need 24 hour supervision/assist at discharge.  Per chart, family plans to take pt home, and are able to provide 24 hour assist.       Follow Up Recommendations  Home health OT;Supervision/Assistance - 24 hour    Equipment Recommendations  None recommended by OT    Recommendations for Other Services       Precautions / Restrictions Precautions Precautions: Fall Precaution Comments: hx of falls Restrictions Weight Bearing Restrictions: No      Mobility Bed Mobility               General bed mobility comments: Pt sitting up in chair   Transfers Overall transfer level: Needs assistance Equipment used: Rolling walker (2 wheeled);4-wheeled walker Transfers: Sit to/from [facility] Sit to Stand: Min assist Stand pivot transfers: Min assist       General transfer comment: assist to steady      Balance Overall balance assessment: Needs assistance Sitting-balance support: Feet supported;No upper extremity supported Sitting balance-Leahy Scale: Fair     Standing balance support: During functional activity;Single extremity supported Standing balance-Leahy Scale: Fair                             ADL either performed or assessed with clinical judgement   ADL Overall ADL's : Needs assistance/impaired Eating/Feeding: Modified independent   Grooming: Wash/dry hands;Wash/dry face;Oral care;Brushing hair;Standing;Minimal assistance Grooming Details (indicate cue type and reason): assist to comb the back of her hair  Upper Body Bathing: Minimal assistance;Sitting   Lower Body Bathing: Minimal assistance;Sit to/from stand   Upper Body Dressing : Minimal assistance;Sitting   Lower Body Dressing: Minimal assistance;Sit to/from stand   Toilet Transfer: Minimal assistance;Ambulation;Comfort height toilet;Grab bars;RW Armed forces technical officer Details (indicate cue type and reason): assist to maneuver RW in small spaces  Toileting- Clothing Manipulation and Hygiene: Minimal assistance;Sit to/from stand       Functional mobility during ADLs: Minimal assistance;Rolling walker General ADL Comments: Pt requires assist to complete tasks      Vision Baseline Vision/History: Wears glasses Wears Glasses: At all times       Perception     Praxis      Pertinent Vitals/Pain Pain Assessment: No/denies pain     Hand Dominance Right   Extremity/Trunk Assessment Upper Extremity Assessment Upper Extremity Assessment: Generalized weakness(arthritic deformity noted )   Lower Extremity Assessment Lower Extremity Assessment: Defer to PT evaluation  Cervical / Trunk Assessment Cervical / Trunk Assessment: Kyphotic   Communication Communication Communication: HOH   Cognition Arousal/Alertness: Awake/alert Behavior During Therapy: WFL for tasks assessed/performed Overall  Cognitive Status: Impaired/Different from baseline Area of Impairment: Orientation;Attention;Memory;Following commands;Safety/judgement;Awareness;Problem solving                 Orientation Level: Disoriented to;Place;Time;Situation Current Attention Level: Sustained Memory: Decreased short-term memory Following Commands: Follows one step commands consistently;Follows one step commands with increased time Safety/Judgement: Decreased awareness of safety;Decreased awareness of deficits   Problem Solving: Slow processing;Decreased initiation;Difficulty sequencing;Requires verbal cues;Requires tactile cues General Comments: Pt moves very slowly.  She is unable to complete her thoughts.  She says "I'm very disappointed", when asked what about, she states "I'm very disappointed about", but unable to generate the rest of her thought.  She does this repeatedly throughout session.  She requires cues to follow commands as she self distracts.  Mod cues to problem solve    General Comments       Exercises     Shoulder Instructions      Home Living Family/patient expects to be discharged to:: Private residence Living Arrangements: Alone Available Help at Discharge: Family;Available 24 hours/day Type of Home: House Home Access: Stairs to enter [employer] of Steps: 3 Entrance Stairs-Rails: Right;Left Home Layout: One level     Bathroom Shower/Tub: Teacher, early years/pre: Handicapped height Bathroom Accessibility: Yes   Home Equipment: Environmental consultant - 2 wheels;Cane - single point;Bedside commode   Additional Comments: Per chart, family is planning to take pt home with 24 hour assist.  Pt unable to provide this info       Prior Functioning/Environment Level of Independence: Independent with assistive device(s)        Comments: cane vs RW for ambulation        OT Problem List: Decreased strength;Decreased activity tolerance;Impaired balance (sitting and/or  standing);Decreased cognition;Decreased safety awareness;Decreased knowledge of use of DME or AE      OT Treatment/Interventions: Self-care/ADL training;Energy conservation;DME and/or AE instruction;Therapeutic activities;Cognitive remediation/compensation;Patient/family education;Balance training    OT Goals(Current goals can be found in the care plan section) Acute Rehab OT Goals Patient Stated Goal: to speak with her daughter  OT Goal Formulation: With patient Time For Goal Achievement: 03/14/18 Potential to Achieve Goals: Good ADL Goals Pt Will Perform Grooming: with supervision;standing Pt Will Perform Upper Body Bathing: with set-up Pt Will Perform Lower Body Bathing: with supervision;sit to/from stand Pt Will Perform Upper Body Dressing: with set-up;sitting Pt Will Perform Lower Body Dressing: with supervision;sit to/from stand Pt Will Transfer to Toilet: with supervision;ambulating;regular height toilet;grab bars;bedside commode Pt Will Perform Toileting - Clothing Manipulation and hygiene: with supervision;sit to/from stand  OT Frequency: Min 2X/week   Barriers to D/C:    family planning to provide 24 hour assist, per chart        Co-evaluation              AM-PAC OT "6 Clicks" Daily Activity     Outcome Measure Help from another person eating meals?: None Help from another person taking care of personal grooming?: A Little Help from another person toileting, which includes using toliet, bedpan, or urinal?: A Little Help from another person bathing (including washing, rinsing, drying)?: A Little Help from another person to put on and taking off regular upper body clothing?: A Little Help from another person to put on and taking off regular lower body clothing?: A Little 6 Click Score: 19  End of Session Equipment Utilized During Treatment: Surveyor, mining Communication: Mobility status  Activity Tolerance: Patient tolerated treatment well Patient left: in  chair;with chair alarm set  OT Visit Diagnosis: Unsteadiness on feet (R26.81)                Time: 2353-6144 OT Time Calculation (min): 15 min Charges:  OT General Charges $OT Visit: 1 Visit OT Evaluation $OT Eval Moderate Complexity: 1 Mod  Lucille Passy, OTR/L [place] Pager [phone] Office [phone]   Lucille Passy M 02/28/2018, 11:03 AM

## 2018-03-01 ENCOUNTER — Inpatient Hospital Stay (HOSPITAL_COMMUNITY): Payer: Medicare Other

## 2018-03-01 NOTE — Progress Notes (Signed)
PROGRESS NOTE    Cathy [name]  WER:[id number] DOB: 04-15-1927 DOA: 02/20/2018 PCP: Biagio Borg, MD   Brief Narrative: Cathy [name] is a 85 y.o. femalewith a PMH of multiple medical problems including known hiatal hernia, chronic systolic CHF with an EF of 25-30% in 2016, but improved to 55-60% in 2018, hypertension, hyperlipidemia and diverticulosis. She presented with abdominal pain and found to have a paraesophageal hernia containing stomach and colon with concern for partial bowel obstruction. Non-operative management. Developed delirium and found to have a ESBL UTI treated with 5 days of Zosyn. Patient declining SNF.   Assessment & Plan:   Principal Problem:   Bowel obstruction (HCC) Active Problems:   Essential hypertension   Abnormal CT of the abdomen   CAD (coronary artery disease), native coronary artery   Chronic combined systolic and diastolic CHF (congestive heart failure) (HCC)   Sinus bradycardia   Hypothyroidism   Paraesophageal hernia Containing stomach and colon with concern for partial bowel obstruction. General surgery evaluated and plan for conservative, non-operative management. Asymptomatic. Having bowel movements. -General surgery recommendations: Advance diet as tolerated. No further recommendations  Essential hypertension On Imdur as an outpatient. -Continue increased dose of Imdur 60mg  daily -Hydralazine prn  Atrial fibrillation -Continue amiodarone  Sinus bradycardia Asymptomatic.  Hypothyroidism Started on Synthroid 50 mcg as an outpatient. Increased to 75 mcg inpatient on admission -Continue 62.5 mcg daily in AM and on discharge (half 125 mcg tablet) -Outpatient TSH recheck in 5-6 weeks  Right sided pleural effusion Chronic since one year ago. Asymptomatic. No symptoms concerning for pneumonia. -Outpatient follow-up  CAD -Continue Lipitor, Imdur  Chronic heart failure with preserved EF Compensated. No evidence of systolic  dysfunction on recent Transthoracic Echocardiogram. Follows with cardiology as an outpatient. On Lasix as an outpatient. -Daily weights  ESBL E. Coli UTI Patient with polyuria but somewhat chronic. Does have some confusion but no evidence of systemic disease. No pyelonephritis on CT scan. Allergy to sulfonamides. Completed 5 days of Zosyn.  Abdominal fullness Improved with voiding. Abdominal x-ray unremarkable. Likely related to some urinary retention. Improved.  Delirium/Confusion Unsure of etiology. Possibly hospital related vs secondary to pain/infection. Improved initially. Now with some worsening confusion. Patient likely has underlying dementia. Repeat urinalysis mostly unchanged. Outpatient concerns newly expressed to me which have now worsened this hospitalization. Again, likely underlying dementia with acute worsening in hospital environment. -CT head -urine culture  Prolonged QTc In setting of bundle branch block. Avoid QT prolonging agents.   DVT prophylaxis: Eliquis Code Status:   Code Status: Full Code Family Communication: Daughter and daughter in law at bedside Disposition Plan: Discharge home w/ [facility] pending CT head result   Consultants:   General surgery  Procedures:   None  Antimicrobials:  Zosyn (12/26>>12/30)   Subjective: No concerns today  Objective: Vitals:   02/28/18 2218 03/01/18 0319 03/01/18 0355 03/01/18 0934  BP: (!) 145/85 (!) 141/92  (!) 163/99  Pulse: 61 64  60  Resp: 20 16  16   Temp: 98.4 F (36.9 C) (!) 97.5 F (36.4 C)  97.7 F (36.5 C)  TempSrc: Oral Oral  Oral  SpO2: 95% 97%  96%  Weight: 52.4 kg  52.4 kg   Height:        Intake/Output Summary (Last 24 hours) at 03/01/2018 1543 Last data filed at 03/01/2018 1303 Gross per 24 hour  Intake 520 ml  Output 800 ml  Net -280 ml   Filed Weights   02/27/18 0500  02/28/18 2218 03/01/18 0355  Weight: 52.1 kg 52.4 kg 52.4 kg    Examination:  General exam: Appears calm and  comfortable Respiratory system: Clear to auscultation. Respiratory effort normal. Cardiovascular system: S1 & S2 heard, RRR. No murmurs, rubs, gallops or clicks. Gastrointestinal system: Abdomen is mildly distended, soft and nontender. No organomegaly or masses felt. Normal bowel sounds heard. Central nervous system: Alert and oriented to person and place. 4/5 right sided weakness. CN intact.  Extremities: No edema. No calf tenderness Skin: No cyanosis. No rashes Psychiatry: Judgement and insight appear normal. Mood & affect appropriate.     Data Reviewed: I have personally reviewed following labs and imaging studies  CBC: Recent Labs  Lab 02/25/18 0736  WBC 8.6  HGB 11.4*  HCT 34.6*  MCV 89.6  PLT 355*   Basic Metabolic Panel: Recent Labs  Lab 02/25/18 0736 02/27/18 1125 02/27/18 1720 02/27/18 2012 02/28/18 0800  NA 139 139  --   --  144  K 2.9* 2.4*  --  3.8 4.3  CL 100 102  --   --  109  CO2 26 26  --   --  28  GLUCOSE 77 174*  --   --  92  BUN <5* 9  --   --  9  CREATININE 0.87 0.95  --   --  0.96  CALCIUM 8.5* 8.8*  --   --  9.1  MG 1.8  --  1.8  --   --    GFR: Estimated Creatinine Clearance: 30 mL/min (by C-G formula based on SCr of 0.96 mg/dL). Liver Function Tests: No results for input(s): AST, ALT, ALKPHOS, BILITOT, PROT, ALBUMIN in the last 168 hours. No results for input(s): LIPASE, AMYLASE in the last 168 hours. No results for input(s): AMMONIA in the last 168 hours. Coagulation Profile: No results for input(s): INR, PROTIME in the last 168 hours. Cardiac Enzymes: No results for input(s): CKTOTAL, CKMB, CKMBINDEX, TROPONINI in the last 168 hours. BNP (last 3 results) No results for input(s): PROBNP in the last 8760 hours. HbA1C: No results for input(s): HGBA1C in the last 72 hours. CBG: No results for input(s): GLUCAP in the last 168 hours. Lipid Profile: No results for input(s): CHOL, HDL, LDLCALC, TRIG, CHOLHDL, LDLDIRECT in the last 72  hours. Thyroid Function Tests: No results for input(s): TSH, T4TOTAL, FREET4, T3FREE, THYROIDAB in the last 72 hours. Anemia Panel: No results for input(s): VITAMINB12, FOLATE, FERRITIN, TIBC, IRON, RETICCTPCT in the last 72 hours. Sepsis Labs: No results for input(s): PROCALCITON, LATICACIDVEN in the last 168 hours.  Recent Results (from the past 240 hour(s))  Urine culture     Status: Abnormal   Collection Time: 02/21/18  7:12 AM  Result Value Ref Range Status   Specimen Description URINE, RANDOM  Final   Special Requests   Final    NONE Performed at [facility], [address], [place], [place] [address]    Culture (A)  Final    >=100,000 COLONIES/mL ESCHERICHIA COLI Confirmed Extended Spectrum Beta-Lactamase Producer (ESBL).  In bloodstream infections from ESBL organisms, carbapenems are preferred over piperacillin/tazobactam. They are shown to have a lower risk of mortality.    Report Status 02/23/2018 FINAL  Final   Organism ID, Bacteria ESCHERICHIA COLI (A)  Final      Susceptibility   Escherichia coli - MIC*    AMPICILLIN >=32 RESISTANT Resistant     CEFAZOLIN >=64 RESISTANT Resistant     CEFTRIAXONE >=64 RESISTANT  Resistant     CIPROFLOXACIN >=4 RESISTANT Resistant     GENTAMICIN <=1 SENSITIVE Sensitive     IMIPENEM <=0.25 SENSITIVE Sensitive     NITROFURANTOIN <=16 SENSITIVE Sensitive     TRIMETH/SULFA <=20 SENSITIVE Sensitive     AMPICILLIN/SULBACTAM >=32 RESISTANT Resistant     PIP/TAZO 8 SENSITIVE Sensitive     Extended ESBL POSITIVE Resistant     * >=100,000 COLONIES/mL ESCHERICHIA COLI         Radiology Studies: No results found.      Scheduled Meds: . amiodarone  200 mg Oral Daily  . apixaban  2.5 mg Oral BID  . furosemide  20 mg Oral QODAY  . isosorbide mononitrate  60 mg Oral Daily  . levothyroxine  62.5 mcg Oral Q0600   Continuous Infusions: . sodium chloride Stopped (02/28/18 0547)     LOS: 8 days     Cordelia Poche, MD [facility] 03/01/2018, 3:42 PM  If 7PM-7AM, please contact night-coverage [email or web address]

## 2018-03-02 ENCOUNTER — Telehealth: Payer: Self-pay | Admitting: *Deleted

## 2018-03-02 DIAGNOSIS — K56600 Partial intestinal obstruction, unspecified as to cause: Secondary | ICD-10-CM

## 2018-03-02 LAB — URINE CULTURE: Culture: 60000 — AB

## 2018-03-02 MED ORDER — LEVOTHYROXINE SODIUM 125 MCG PO TABS: 62.5000 ug | ORAL_TABLET | Freq: Every day | ORAL | 0 refills | Status: DC

## 2018-03-02 MED ORDER — ISOSORBIDE MONONITRATE ER 60 MG PO TB24: 60.0000 mg | ORAL_TABLET | Freq: Every day | ORAL | 0 refills | Status: DC

## 2018-03-02 NOTE — Progress Notes (Signed)
Discharge instructions (including medications) discussed with and copy provided to patient/caregiver 

## 2018-03-02 NOTE — Care Management Note (Signed)
Case Management Note Manya Silvas, RN MSN CCM Transitions of Care 68[address] [place] [phone]  Patient Details  Name: Cathy [name] MRN: [id number] Date of Birth: [birth year]  Subjective/Objective:         Bowel obstruction           Action/Plan: 02/27/2018 [place], RN MSN CCM PTA home alone. PT recommending snf, however, pt is declining snf at this time. Offered choice to pt and family. They have used [facility] in the past. Pt will need Home Health orders for PT/OT/RN/aide and face to face prior to transition home. Pt will also need 3:1. Will need DME order. Family reports that pt has a rolling walker at home. Family also states that pt has [employer] for emergency assist. Will continue to follow for transition of care needs.   Expected Discharge Date:  03/02/18               Expected Discharge Plan:  Skilled Nursing Facility  In-House Referral:  Clinical Social Work  Discharge planning Services  CM Consult  Post Acute Care Choice:  Home Health, Durable Medical Equipment Choice offered to:  Patient, Adult Children  DME Arranged:  3-N-1 DME Agency:  [place]:  RN, PT, Nurse's Aide, OT HH Agency:  [facility]  Status of Service:  Completed, signed off  If discussed at [place] of Stay Meetings, dates discussed:    Additional Comments: 03/02/2018 [place], RN MSN CCM Patient to transition home today with [facility]-RN, PT, OT, Aide and DME 3:1. HH order,  Face to Face, and DME order provided. No other transition of care needs identified.   02/28/2018 [place], RN MSN CCM Received call back from [name] at [facility] to accept pt for [facility] services. Will need HH PT, OT, RN, Aide and face to face at discharge. Will continue to follow for transition of care needs.   02/27/2018 [place], RN MSN CCM Received call back from [place] at [facility] stating unable to accept pt at this time. Referral made to [facility]  per 2nd choice. Will f/u for transition needs.  Bartholomew Crews, RN 03/02/2018, 9:52 AM Case Management Note  Patient Details  Name: Cathy [name] MRN: [id number] Date of Birth: Oct 14, 1927  Subjective/Objective:                    Action/Plan:   Expected Discharge Date:  03/02/18               Expected Discharge Plan:  Skilled Nursing Facility  In-House Referral:  Clinical Social Work  Discharge planning Services  CM Consult  Post Acute Care Choice:  Home Health, Durable Medical Equipment Choice offered to:  Patient, Adult Children  DME Arranged:  3-N-1 DME Agency:  [place]:  RN, PT, Nurse's Aide, OT HH Agency:  [facility]  Status of Service:  Completed, signed off  If discussed at [place] of Stay Meetings, dates discussed:    Additional Comments:  Bartholomew Crews, RN 03/02/2018, 9:51 AM

## 2018-03-02 NOTE — Discharge Instructions (Signed)

## 2018-03-02 NOTE — Discharge Summary (Addendum)
Physician Discharge Summary  Cathy [name] KYH:[id number] DOB: 1928/02/04 DOA: 02/20/2018  PCP: Biagio Borg, MD  Admit date: 02/20/2018 Discharge date: 03/02/2018  Admitted From: Home Disposition:  Home, patient and family declined SNF recommendation   Recommendations for Outpatient Follow-up:  1. Follow up with PCP in 1 week  Home Health: PT OT RN Aide  Equipment/Devices: 3n1    Discharge Condition: Stable CODE STATUS: Full  Diet recommendation: Regular   Brief/Interim Summary: From H&P by Dr. Rockne Menghini: Cathy [name] is an 85 y.o. female  with a PMH of multiple medical problems including known hiatal hernia, chronic systolic CHF with an EF of 25-30% in 2016, but improved to 55-60% in 2018, hypertension, hyperlipidemia and diverticulosis who presents with a 24-hour history of abdominal pain associated with nausea who was brought to the ED for evaluation by her family who states she has been "gray looking "for the past 2-3 days and has not had an appetite.  Family reports that she has been complaining of abdominal pain but that she moved her bowels normally 2 days ago.  There has not been any associated vomiting.  The patient is very slow to respond and is unable to provide much additional history.  ED Course: Blood pressure 164/65, pulse 40, respiratory rate 14, O2 saturation 96% on room air.  Labs significant for a potassium of 3.4, BUN 14, creatinine 0.90, hemoglobin 11.7, white blood cell count 11.3.  Underwent CT scan of the abdomen and pelvis which showed a hiatal hernia and a partial colonic obstruction.  The ED physician consulted with general surgery as well.  Interim: General surgery was consulted who recommended nonoperative management.  Diet was advanced as tolerated.  She was treated for ESBL E. coli UTI with IV Zosyn.  She also underwent CT head due to confusion, which found no acute intracranial change.  It also revealed remote infarct.  Physical therapy work with patient,  recommended skilled nursing facility placement.  Patient as well as family declined and elected to take patient home with home health services.  Discharge Diagnoses:  Principal Problem:   Bowel obstruction (HCC) Active Problems:   Essential hypertension   Abnormal CT of the abdomen   CAD (coronary artery disease), native coronary artery   Chronic combined systolic and diastolic CHF (congestive heart failure) (HCC)   Sinus bradycardia   Hypothyroidism   Paraesophageal hernia Containing stomach and colon with concern for partial bowel obstruction. General surgery evaluated and plan for conservative, non-operative management. Asymptomatic. Having bowel movements. Tolerating diet without issue   Essential hypertension Continue increased dose of Imdur 60mg  daily  Atrial fibrillation Continue amiodarone, eliquis   Sinus bradycardia Asymptomatic  Hypothyroidism Continue Synthroid 62.5 mcg daily. Outpatient TSH recheck in 5-6 weeks  Right sided pleural effusion Chronic since one year ago. Asymptomatic. No symptoms concerning for pneumonia. Outpatient follow-up  CAD Continue Lipitor, Imdur  Chronic heart failure with preserved EF Compensated. No evidence of systolic dysfunction on recent Transthoracic Echocardiogram. Follows with cardiology as an outpatient. On Lasix as an outpatient.  ESBL E. Coli UTI Patient with polyuria but somewhat chronic. Does have some confusion but no evidence of systemic disease. No pyelonephritis on CT scan. Allergy to sulfonamides. Completed 5 days of Zosyn.  Delirium Unsure of etiology. Possibly hospital related vs secondary to pain/infection. Patient likely has underlying dementia. CT head without acute findings. Remote lacunar infarct in the medial left thalamus. Stable this morning, agree with underlying dementia.   Prolonged QTc In setting  of bundle branch block. Avoid QT prolonging agents.    Discharge Instructions  Discharge  Instructions    Call MD for:  difficulty breathing, headache or visual disturbances   Complete by:  As directed    Call MD for:  extreme fatigue   Complete by:  As directed    Call MD for:  hives   Complete by:  As directed    Call MD for:  persistant dizziness or light-headedness   Complete by:  As directed    Call MD for:  persistant nausea and vomiting   Complete by:  As directed    Call MD for:  severe uncontrolled pain   Complete by:  As directed    Call MD for:  temperature >100.4   Complete by:  As directed    Diet general   Complete by:  As directed    Discharge instructions   Complete by:  As directed    You were cared for by a hospitalist during your hospital stay. If you have any questions about your discharge medications or the care you received while you were in the hospital after you are discharged, you can call the unit and ask to speak with the hospitalist on call if the hospitalist that took care of you is not available. Once you are discharged, your primary care physician will handle any further medical issues. Please note that NO REFILLS for any discharge medications will be authorized once you are discharged, as it is imperative that you return to your primary care physician (or establish a relationship with a primary care physician if you do not have one) for your aftercare needs so that they can reassess your need for medications and monitor your lab values.   Increase activity slowly   Complete by:  As directed      Allergies as of 03/02/2018      Reactions   Amoxicillin-pot Clavulanate Diarrhea   Has patient had a PCN reaction causing immediate rash, facial/tongue/throat swelling, SOB or lightheadedness with hypotension: no Has patient had a PCN reaction causing severe rash involving mucus membranes or skin necrosis:  no Has patient had a PCN reaction that required hospitalization: no Has patient had a PCN reaction occurring within the last 10 years no If all of  the above answers are "NO", then may proceed with Cephalosporin use.   Sulfonamide Derivatives Other (See Comments)   unknown      Medication List    STOP taking these medications   pregabalin 50 MG capsule Commonly known as:  LYRICA     TAKE these medications   amiodarone 200 MG tablet Commonly known as:  PACERONE TAKE 1 TABLET DAILY   atorvastatin 20 MG tablet Commonly known as:  LIPITOR TAKE 1 TABLET DAILY AT 6 P.M. What changed:  See the new instructions.   calcium-vitamin D 500-200 MG-UNIT tablet Commonly known as:  OSCAL WITH D Take 1 tablet by mouth daily.   cholecalciferol 1000 units tablet Commonly known as:  VITAMIN D Take 1,000 Units by mouth daily.   diclofenac 1.3 % Ptch Commonly known as:  FLECTOR Place 1 patch onto the skin daily.   ELIQUIS 2.5 MG Tabs tablet Generic drug:  apixaban TAKE 1 TABLET TWICE A DAY What changed:  how much to take   esomeprazole 40 MG capsule Commonly known as:  NEXIUM TAKE 1 CAPSULE DAILY   furosemide 20 MG tablet Commonly known as:  LASIX Take 1 tablet (20 mg total) by  mouth every other day. What changed:  Another medication with the same name was removed. Continue taking this medication, and follow the directions you see here.   isosorbide mononitrate 60 MG 24 hr tablet Commonly known as:  IMDUR Take 1 tablet (60 mg total) by mouth daily. What changed:    medication strength  See the new instructions.   levothyroxine 125 MCG tablet Commonly known as:  SYNTHROID, LEVOTHROID Take 0.5 tablets (62.5 mcg total) by mouth daily. What changed:    medication strength  how much to take   polyethylene glycol packet Commonly known as:  MIRALAX / GLYCOLAX Take 17 g by mouth 2 (two) times daily. What changed:    when to take this  reasons to take this   senna-docusate 8.6-50 MG tablet Commonly known as:  Senokot-S Take 1 tablet by mouth at bedtime. What changed:    when to take this  reasons to take this    solifenacin 10 MG tablet Commonly known as:  VESICARE Take 1 tablet (10 mg total) by mouth daily.   vitamin B-12 1000 MCG tablet Commonly known as:  CYANOCOBALAMIN Take 1 tablet (1,000 mcg total) by mouth daily.      Follow-up Information    [facility] Follow up.   Specialty:  Home Health Services Why:  physical therapy, occupational therapy, RN, aide Contact information: [place] [address] [phone]        Biagio Borg, MD. Schedule an appointment as soon as possible for a visit in 1 week.   Specialties:  Internal Medicine, Radiology Contact information: [place] [address] [phone]        [facility] Follow up.   Why:  3N1 to be provided prior to discharge Contact information: [place] [address] [phone]          Allergies  Allergen Reactions  . Amoxicillin-Pot Clavulanate Diarrhea    Has patient had a PCN reaction causing immediate rash, facial/tongue/throat swelling, SOB or lightheadedness with hypotension: no Has patient had a PCN reaction causing severe rash involving mucus membranes or skin necrosis:  no Has patient had a PCN reaction that required hospitalization: no Has patient had a PCN reaction occurring within the last 10 years no If all of the above answers are "NO", then may proceed with Cephalosporin use.   . Sulfonamide Derivatives Other (See Comments)    unknown    Consultations:  General surgery    Procedures/Studies: Dg Chest 2 View  Result Date: 02/21/2018 CLINICAL DATA:  Acute onset of intermittent right lower quadrant abdominal pain and generalized chest pain. Shortness of breath. EXAM: CHEST - 2 VIEW COMPARISON:  Chest radiograph performed 12/01/2016 FINDINGS: The lungs are well-aerated. Small bilateral pleural effusions are noted. Bibasilar airspace opacities may reflect atelectasis or pneumonia. There is no evidence  of pneumothorax. The heart is borderline normal in size. No acute osseous abnormalities are seen. The patient's very large paraesophageal hernia is again noted. Mild degenerative change is noted at the glenohumeral joints bilaterally. IMPRESSION: 1. Small bilateral pleural effusions. Bibasilar airspace opacities may reflect atelectasis or pneumonia. 2. Very large paraesophageal hernia again noted. Electronically Signed   By: Garald Balding M.D.   On: 02/21/2018 05:28   Ct Head Wo Contrast  Result Date: 03/01/2018 CLINICAL DATA:  Altered level of consciousness EXAM: CT HEAD WITHOUT CONTRAST TECHNIQUE: Contiguous axial images were obtained from the base of  the skull through the vertex without intravenous contrast. COMPARISON:  01/05/2017 FINDINGS: Brain: No evidence of acute infarction, hemorrhage, hydrocephalus, extra-axial collection or mass lesion/mass effect. Age congruent cerebral volume loss and periventricular chronic small vessel ischemia. Remote lacunar infarct in the medial left thalamus. Insignificant dural calcification along the left occipital parietal convexity. Vascular: No hyperdense vessel or unexpected calcification. Skull: Negative Sinuses/Orbits: Negative IMPRESSION: Senescent changes without acute finding.  Stable from 2018. Electronically Signed   By: Monte Fantasia M.D.   On: 03/01/2018 19:02   Ct Angio Chest Pe W And/or Wo Contrast  Result Date: 02/21/2018 CLINICAL DATA:  85 year old female with periumbilical abdominal pain for the past 24 hours and nausea but no vomiting. Extensive medical history including coronary artery disease, congestive heart failure, hypertension, atrial flutter and prior colitis. EXAM: CT ANGIOGRAPHY CHEST CT ABDOMEN AND PELVIS WITH CONTRAST TECHNIQUE: Multidetector CT imaging of the chest was performed using the standard protocol during bolus administration of intravenous contrast. Multiplanar CT image reconstructions and MIPs were obtained to evaluate the  vascular anatomy. Multidetector CT imaging of the abdomen and pelvis was performed using the standard protocol during bolus administration of intravenous contrast. CONTRAST:  159mL ISOVUE-370 IOPAMIDOL (ISOVUE-370) INJECTION 76% COMPARISON:  Most recent prior CT scan of the abdomen and pelvis 11/27/2016; prior PET-CT including chest CT 02/21/2015 FINDINGS: CTA CHEST FINDINGS Cardiovascular: Adequate opacification of the pulmonary arteries to the proximal segmental level. No evidence of filling defect to suggest acute pulmonary embolus. Marked mass effect on the heart secondary to the massive hiatal hernia. Calcification present in the mitral valve annulus. CT atherosclerotic calcifications noted along the coronary arteries. No pericardial effusion. Mediastinum/Nodes: Progressive massive hiatal hernia containing the entire stomach and a large segment of colon. There has been substantial worsening compared to 11/27/2016. Lungs/Pleura: Moderately large layering right pleural effusion. Trace left pleural effusion. Marked atelectasis of both lower lobe secondary to a combination of pleural effusion and mass effect from the hiatal hernia. Musculoskeletal: Heavily exaggerated thoracic kyphosis. Multilevel degenerative disc disease and facet arthropathy. No acute fracture, malalignment or evidence of lytic or blastic osseous lesion. Review of the MIP images confirms the above findings. CT ABDOMEN and PELVIS FINDINGS Hepatobiliary: Punctate calcification in the right liver, unchanged and likely sequelae of old granulomatous disease. No suspicious liver lesion. The gallbladder is surgically absent. Similar degree of relatively severe intra and extrahepatic biliary ductal dilatation compared to prior imaging from 11/27/2016. Common bile duct again measures 14 mm. No definite distal mass or filling defect. Pancreas: No definite pancreatic mass or inflammatory changes. Anatomy is distorted by upward migration of the stomach and  transverse colon. Spleen: Similar appearance of the spleen with innumerable low-attenuation lesions scattered throughout. Lesions are not substantially changed in size, or configuration compared to prior imaging from 2018 and 2016. These lesions demonstrated no evidence of hypermetabolic activity on prior PET-CT and are considered benign. Adrenals/Urinary Tract: Stable 3 cm left adrenal adenoma. The right adrenal gland is unremarkable. Mild renal cortical thinning bilaterally with small areas of renal scarring versus tiny cyst formation. No hydronephrosis, nephrolithiasis or enhancing renal mass. Stomach/Bowel: Herniation of a large segments of the transverse colon into the large hiatal hernia. There is an approximately 3.8 cm segment of circumferential narrowing in the region of the displaced hepatic flexure which is nonspecific. Additionally, there is a second region of focal mild wall thickening and narrowing measuring approximately 4.6 cm in the proximal transverse colon where the colon passes through the hiatal hernia defect. There  is a third region of colonic narrowing were the more distal transverse colon exits the hiatal hernia just proximal to the splenic flexure. The cecum and ascending colon are mildly distended proximal to these 2 sites and low-grade obstruction is not excluded. Vascular/Lymphatic: Atherosclerotic calcifications throughout the highly tortuous abdominal aorta. No evidence of dissection or aneurysm. No suspicious lymphadenopathy. Reproductive: Surgical changes of hysterectomy. Significant pelvic floor laxity with descent of small bowel into the region of laxity. Other: Reticulation of the subcutaneous fat diffusely consistent with body wall edema or anasarca. No peritoneal ascites. Musculoskeletal: Severe multilevel degenerative disc disease and facet arthropathy. No definite acute compression fracture. No lytic or blastic osseous lesion. Review of the MIP images confirms the above  findings. IMPRESSION: 1. Negative for acute pulmonary embolus. 2. There has been significant interval progression of a now massive hiatal hernia compared to prior imaging from 2018 and 2016. The entire stomach and the majority of the transverse colon are now intrathoracic and there is imaging evidence (diffuse mild dilatation of the cecum and ascending colon) concerning for low grade or partial colonic obstruction. No evidence of colonic wall thickening, inflammation or perforation at this time. There are 3 areas of circumferential narrowing involving the colon beginning at the hepatic flexure which is displaced toward the left upper quadrant just prior to entering the hernia defect, and then again in the proximal and distal transverse colon were the colon enters and exits the hernia defect. These 3 segments are favored to be secondary to external compression related to the hernia, however an underlying colonic neoplasm is difficult to exclude entirely at the most proximal site which is outside the hernia defect. The patient's periumbilical pain is likely due to distension of the ascending colon and cecum. Patient may be at risk for more significant colonic obstruction. Recommend surgical consultation. 3. Moderately large layering right pleural effusion. 4. Trace left pleural effusion. 5. Coronary artery calcifications. 6.  Aortic Atherosclerosis (ICD10-170.0). 7. Severe multilevel degenerative disc disease and facet arthropathy with exaggerated thoracic kyphosis. No compression fractures identified. 8. Pelvic floor laxity with downward migration of the small bowel into the pelvis posterior to the rectum. As the colon has been drawn up into the chest, it appears the small bowel has descended to fill the evacuated space. No evidence of small bowel wall thickening or obstruction. 9. Additional ancillary findings as above. Electronically Signed   By: Jacqulynn Cadet M.D.   On: 02/21/2018 08:47   Ct Abdomen Pelvis W  Contrast  Result Date: 02/21/2018 CLINICAL DATA:  85 year old female with periumbilical abdominal pain for the past 24 hours and nausea but no vomiting. Extensive medical history including coronary artery disease, congestive heart failure, hypertension, atrial flutter and prior colitis. EXAM: CT ANGIOGRAPHY CHEST CT ABDOMEN AND PELVIS WITH CONTRAST TECHNIQUE: Multidetector CT imaging of the chest was performed using the standard protocol during bolus administration of intravenous contrast. Multiplanar CT image reconstructions and MIPs were obtained to evaluate the vascular anatomy. Multidetector CT imaging of the abdomen and pelvis was performed using the standard protocol during bolus administration of intravenous contrast. CONTRAST:  146mL ISOVUE-370 IOPAMIDOL (ISOVUE-370) INJECTION 76% COMPARISON:  Most recent prior CT scan of the abdomen and pelvis 11/27/2016; prior PET-CT including chest CT 02/21/2015 FINDINGS: CTA CHEST FINDINGS Cardiovascular: Adequate opacification of the pulmonary arteries to the proximal segmental level. No evidence of filling defect to suggest acute pulmonary embolus. Marked mass effect on the heart secondary to the massive hiatal hernia. Calcification present in the mitral  valve annulus. CT atherosclerotic calcifications noted along the coronary arteries. No pericardial effusion. Mediastinum/Nodes: Progressive massive hiatal hernia containing the entire stomach and a large segment of colon. There has been substantial worsening compared to 11/27/2016. Lungs/Pleura: Moderately large layering right pleural effusion. Trace left pleural effusion. Marked atelectasis of both lower lobe secondary to a combination of pleural effusion and mass effect from the hiatal hernia. Musculoskeletal: Heavily exaggerated thoracic kyphosis. Multilevel degenerative disc disease and facet arthropathy. No acute fracture, malalignment or evidence of lytic or blastic osseous lesion. Review of the MIP images  confirms the above findings. CT ABDOMEN and PELVIS FINDINGS Hepatobiliary: Punctate calcification in the right liver, unchanged and likely sequelae of old granulomatous disease. No suspicious liver lesion. The gallbladder is surgically absent. Similar degree of relatively severe intra and extrahepatic biliary ductal dilatation compared to prior imaging from 11/27/2016. Common bile duct again measures 14 mm. No definite distal mass or filling defect. Pancreas: No definite pancreatic mass or inflammatory changes. Anatomy is distorted by upward migration of the stomach and transverse colon. Spleen: Similar appearance of the spleen with innumerable low-attenuation lesions scattered throughout. Lesions are not substantially changed in size, or configuration compared to prior imaging from 2018 and 2016. These lesions demonstrated no evidence of hypermetabolic activity on prior PET-CT and are considered benign. Adrenals/Urinary Tract: Stable 3 cm left adrenal adenoma. The right adrenal gland is unremarkable. Mild renal cortical thinning bilaterally with small areas of renal scarring versus tiny cyst formation. No hydronephrosis, nephrolithiasis or enhancing renal mass. Stomach/Bowel: Herniation of a large segments of the transverse colon into the large hiatal hernia. There is an approximately 3.8 cm segment of circumferential narrowing in the region of the displaced hepatic flexure which is nonspecific. Additionally, there is a second region of focal mild wall thickening and narrowing measuring approximately 4.6 cm in the proximal transverse colon where the colon passes through the hiatal hernia defect. There is a third region of colonic narrowing were the more distal transverse colon exits the hiatal hernia just proximal to the splenic flexure. The cecum and ascending colon are mildly distended proximal to these 2 sites and low-grade obstruction is not excluded. Vascular/Lymphatic: Atherosclerotic calcifications  throughout the highly tortuous abdominal aorta. No evidence of dissection or aneurysm. No suspicious lymphadenopathy. Reproductive: Surgical changes of hysterectomy. Significant pelvic floor laxity with descent of small bowel into the region of laxity. Other: Reticulation of the subcutaneous fat diffusely consistent with body wall edema or anasarca. No peritoneal ascites. Musculoskeletal: Severe multilevel degenerative disc disease and facet arthropathy. No definite acute compression fracture. No lytic or blastic osseous lesion. Review of the MIP images confirms the above findings. IMPRESSION: 1. Negative for acute pulmonary embolus. 2. There has been significant interval progression of a now massive hiatal hernia compared to prior imaging from 2018 and 2016. The entire stomach and the majority of the transverse colon are now intrathoracic and there is imaging evidence (diffuse mild dilatation of the cecum and ascending colon) concerning for low grade or partial colonic obstruction. No evidence of colonic wall thickening, inflammation or perforation at this time. There are 3 areas of circumferential narrowing involving the colon beginning at the hepatic flexure which is displaced toward the left upper quadrant just prior to entering the hernia defect, and then again in the proximal and distal transverse colon were the colon enters and exits the hernia defect. These 3 segments are favored to be secondary to external compression related to the hernia, however an underlying colonic neoplasm is  difficult to exclude entirely at the most proximal site which is outside the hernia defect. The patient's periumbilical pain is likely due to distension of the ascending colon and cecum. Patient may be at risk for more significant colonic obstruction. Recommend surgical consultation. 3. Moderately large layering right pleural effusion. 4. Trace left pleural effusion. 5. Coronary artery calcifications. 6.  Aortic Atherosclerosis  (ICD10-170.0). 7. Severe multilevel degenerative disc disease and facet arthropathy with exaggerated thoracic kyphosis. No compression fractures identified. 8. Pelvic floor laxity with downward migration of the small bowel into the pelvis posterior to the rectum. As the colon has been drawn up into the chest, it appears the small bowel has descended to fill the evacuated space. No evidence of small bowel wall thickening or obstruction. 9. Additional ancillary findings as above. Electronically Signed   By: Jacqulynn Cadet M.D.   On: 02/21/2018 08:47   Dg Abd 2 Views  Result Date: 02/22/2018 CLINICAL DATA:  Hiatal hernia, irritable bowel syndrome, hypertension EXAM: ABDOMEN - 2 VIEW COMPARISON:  CT abdomen and pelvis 02/21/2018 FINDINGS: Large hiatal hernia containing stomach and colon. Bowel gas pattern otherwise normal. Retained contrast in distal colon. No bowel dilatation or bowel wall thickening. Osseous demineralization with degenerative disc and facet disease changes of the thoracolumbar spine associated with biconvex scoliosis. Surgical clips in RIGHT upper quadrant from cholecystectomy. Bibasilar pleural effusions and atelectasis. IMPRESSION: Large hiatal hernia containing stomach and colon. Otherwise normal bowel gas pattern. Bibasilar pleural effusions and atelectasis. Electronically Signed   By: Lavonia Dana M.D.   On: 02/22/2018 16:32   Dg Abd Portable 1v  Result Date: 02/24/2018 CLINICAL DATA:  Diarrhea, questionable constipation EXAM: PORTABLE ABDOMEN - 1 VIEW COMPARISON:  02/22/2018 FINDINGS: Prior cholecystectomy. Nonobstructive bowel gas pattern. No significant increase in stool burden. No organomegaly or suspicious calcification. No free air. Small bilateral effusions with bibasilar atelectasis or infiltrates. IMPRESSION: No evidence of bowel obstruction or free air. Small bilateral effusions with bibasilar atelectasis or infiltrates. Electronically Signed   By: Rolm Baptise M.D.   On:  02/24/2018 11:44      Discharge Exam: Vitals:   03/01/18 2056 03/02/18 0450  BP: 129/82 (!) 143/76  Pulse: 70 (!) 57  Resp: 18 18  Temp: 98.4 F (36.9 C) 97.6 F (36.4 C)  SpO2: 96% 94%    General: Pt is alert, awake, not in acute distress Cardiovascular: RRR, S1/S2 +, no rubs, no gallops Respiratory: CTA bilaterally, no wheezing, no rhonchi Abdominal: Soft, NT, ND, bowel sounds + Extremities: no edema, no cyanosis Neuro: alert, oriented to self and place only, pleasantly confused regarding events of hospitalization, follows commands appropriately, speech clear, nonfocal exam     The results of significant diagnostics from this hospitalization (including imaging, microbiology, ancillary and laboratory) are listed below for reference.     Microbiology: Recent Results (from the past 240 hour(s))  Urine culture     Status: Abnormal   Collection Time: 02/21/18  7:12 AM  Result Value Ref Range Status   Specimen Description URINE, RANDOM  Final   Special Requests   Final    NONE Performed at [facility], [address], [place], [place] [address]    Culture (A)  Final    >=100,000 COLONIES/mL ESCHERICHIA COLI Confirmed Extended Spectrum Beta-Lactamase Producer (ESBL).  In bloodstream infections from ESBL organisms, carbapenems are preferred over piperacillin/tazobactam. They are shown to have a lower risk of mortality.    Report Status 02/23/2018 FINAL  Final  Organism ID, Bacteria ESCHERICHIA COLI (A)  Final      Susceptibility   Escherichia coli - MIC*    AMPICILLIN >=32 RESISTANT Resistant     CEFAZOLIN >=64 RESISTANT Resistant     CEFTRIAXONE >=64 RESISTANT Resistant     CIPROFLOXACIN >=4 RESISTANT Resistant     GENTAMICIN <=1 SENSITIVE Sensitive     IMIPENEM <=0.25 SENSITIVE Sensitive     NITROFURANTOIN <=16 SENSITIVE Sensitive     TRIMETH/SULFA <=20 SENSITIVE Sensitive     AMPICILLIN/SULBACTAM >=32 RESISTANT Resistant     PIP/TAZO 8 SENSITIVE Sensitive      Extended ESBL POSITIVE Resistant     * >=100,000 COLONIES/mL ESCHERICHIA COLI  Culture, Urine     Status: Abnormal   Collection Time: 03/01/18  9:16 AM  Result Value Ref Range Status   Specimen Description URINE, CLEAN CATCH  Final   Special Requests NONE  Final   Culture (A)  Final    60,000 COLONIES/mL DIPHTHEROIDS(CORYNEBACTERIUM SPECIES) Standardized susceptibility testing for this organism is not available. Performed at [facility], [place] [address], [place], [place] [address]    Report Status 03/02/2018 FINAL  Final     Labs: BNP (last 3 results) No results for input(s): BNP in the last 8760 hours. Basic Metabolic Panel: Recent Labs  Lab 02/25/18 0736 02/27/18 1125 02/27/18 1720 02/27/18 2012 02/28/18 0800  NA 139 139  --   --  144  K 2.9* 2.4*  --  3.8 4.3  CL 100 102  --   --  109  CO2 26 26  --   --  28  GLUCOSE 77 174*  --   --  92  BUN <5* 9  --   --  9  CREATININE 0.87 0.95  --   --  0.96  CALCIUM 8.5* 8.8*  --   --  9.1  MG 1.8  --  1.8  --   --    Liver Function Tests: No results for input(s): AST, ALT, ALKPHOS, BILITOT, PROT, ALBUMIN in the last 168 hours. No results for input(s): LIPASE, AMYLASE in the last 168 hours. No results for input(s): AMMONIA in the last 168 hours. CBC: Recent Labs  Lab 02/25/18 0736  WBC 8.6  HGB 11.4*  HCT 34.6*  MCV 89.6  PLT 142*   Cardiac Enzymes: No results for input(s): CKTOTAL, CKMB, CKMBINDEX, TROPONINI in the last 168 hours. BNP: Invalid input(s): POCBNP CBG: No results for input(s): GLUCAP in the last 168 hours. D-Dimer No results for input(s): DDIMER in the last 72 hours. Hgb A1c No results for input(s): HGBA1C in the last 72 hours. Lipid Profile No results for input(s): CHOL, HDL, LDLCALC, TRIG, CHOLHDL, LDLDIRECT in the last 72 hours. Thyroid function studies No results for input(s): TSH, T4TOTAL, T3FREE, THYROIDAB in the last 72 hours.  Invalid input(s): FREET3 Anemia work up No results  for input(s): VITAMINB12, FOLATE, FERRITIN, TIBC, IRON, RETICCTPCT in the last 72 hours. Urinalysis    Component Value Date/Time   COLORURINE YELLOW 02/28/2018 1713   APPEARANCEUR HAZY (A) 02/28/2018 1713   LABSPEC 1.013 02/28/2018 1713   PHURINE 5.0 02/28/2018 1713   GLUCOSEU NEGATIVE 02/28/2018 1713   GLUCOSEU NEGATIVE 04/11/2017 1536   HGBUR SMALL (A) 02/28/2018 1713   BILIRUBINUR NEGATIVE 02/28/2018 1713   BILIRUBINUR neg 11/18/2016 Hand 02/28/2018 1713   PROTEINUR NEGATIVE 02/28/2018 1713   UROBILINOGEN 0.2 04/11/2017 1536   NITRITE NEGATIVE 02/28/2018 1713   LEUKOCYTESUR TRACE (  A) 02/28/2018 1713   Sepsis Labs Invalid input(s): PROCALCITONIN,  WBC,  LACTICIDVEN Microbiology Recent Results (from the past 240 hour(s))  Urine culture     Status: Abnormal   Collection Time: 02/21/18  7:12 AM  Result Value Ref Range Status   Specimen Description URINE, RANDOM  Final   Special Requests   Final    NONE Performed at [facility], [address], [place], [place] [address]    Culture (A)  Final    >=100,000 COLONIES/mL ESCHERICHIA COLI Confirmed Extended Spectrum Beta-Lactamase Producer (ESBL).  In bloodstream infections from ESBL organisms, carbapenems are preferred over piperacillin/tazobactam. They are shown to have a lower risk of mortality.    Report Status 02/23/2018 FINAL  Final   Organism ID, Bacteria ESCHERICHIA COLI (A)  Final      Susceptibility   Escherichia coli - MIC*    AMPICILLIN >=32 RESISTANT Resistant     CEFAZOLIN >=64 RESISTANT Resistant     CEFTRIAXONE >=64 RESISTANT Resistant     CIPROFLOXACIN >=4 RESISTANT Resistant     GENTAMICIN <=1 SENSITIVE Sensitive     IMIPENEM <=0.25 SENSITIVE Sensitive     NITROFURANTOIN <=16 SENSITIVE Sensitive     TRIMETH/SULFA <=20 SENSITIVE Sensitive     AMPICILLIN/SULBACTAM >=32 RESISTANT Resistant     PIP/TAZO 8 SENSITIVE Sensitive     Extended ESBL POSITIVE Resistant     * >=100,000  COLONIES/mL ESCHERICHIA COLI  Culture, Urine     Status: Abnormal   Collection Time: 03/01/18  9:16 AM  Result Value Ref Range Status   Specimen Description URINE, CLEAN CATCH  Final   Special Requests NONE  Final   Culture (A)  Final    60,000 COLONIES/mL DIPHTHEROIDS(CORYNEBACTERIUM SPECIES) Standardized susceptibility testing for this organism is not available. Performed at [facility], [place] [address] [place], [place] [address]    Report Status 03/02/2018 FINAL  Final     Patient was seen and examined on the day of discharge and was found to be in stable condition. Time coordinating discharge: 40 minutes including assessment and coordination of care, as well as examination of the patient.   SIGNED:  Dessa Phi, DO [facility] Pager [phone]  If 7PM-7AM, please contact night-coverage [email or web address] Password [facility] 03/02/2018, 3:32 PM

## 2018-03-02 NOTE — Telephone Encounter (Signed)
Transition Care Management Follow-up Telephone Call How have you been since you were released from the hospital? Pt states she is feeling fairly well. Patient is waiting to hear from [facility] for PT, OT, etc.   Do you understand why you were in the hospital? Yes, abd pain   Do you understand the discharge instrcutions? Yes  Items Reviewed:  Medications reviewed: Yes  Allergies reviewed: Yes  Dietary changes reviewed: Yes  Referrals reviewed: Yes   Functional Questionnaire:   Activities of Daily Living (ADLs):   States they are independent in the following: All ADL's but the one (s) listed below.  States they require assistance with the following: Using the bathroom   Any transportation issues/concerns?: Yes   Any patient concerns? No  Confirmed importance and date/time of follow-up visits scheduled: Yes  Confirmed with patient if condition begins to worsen call PCP or go to the ER.  Patient was given the Call-a-Nurse line [phone]: Yes

## 2018-03-03 ENCOUNTER — Telehealth: Payer: Self-pay | Admitting: Internal Medicine

## 2018-03-03 NOTE — Telephone Encounter (Signed)
Copied from [place] [id number]. Topic: Quick Communication - Home Health Verbal Orders >> Mar 03, 2018  4:12 PM Bea Graff, NT wrote: Caller/Agency: [facility] Number: [phone] Requesting OT/PT/Skilled Nursing/Social Work: PT Frequency: 2 times a week for 3 weeks, 1 time a week for 1 week

## 2018-03-06 ENCOUNTER — Telehealth: Payer: Self-pay | Admitting: Internal Medicine

## 2018-03-06 NOTE — Telephone Encounter (Signed)
Copied from [place] [phone]. Topic: Quick Communication - Home Health Verbal Orders >> Mar 06, 2018  4:17 PM Leward Quan A wrote: Caller/Agency: Will Osker Mason / [facility] Callback Number: [phone] ok to LM Requesting OT/PT/Skilled Nursing/Social Work: OT  Frequency: 1  week 1x wk and 2 weeks 2x

## 2018-03-06 NOTE — Telephone Encounter (Signed)
Verbal orders given  

## 2018-03-07 NOTE — Telephone Encounter (Signed)
Verbal orders given  

## 2018-03-07 NOTE — Telephone Encounter (Signed)
Called patient to discuss Prolia and to let her know that the [place] [facility] reopened. Patient states that she has been in the hospital and will call us back once she has everything sorted out. Advised patient that the grants open and close due to funding, patient would still like to hold off for now.  10:25 AM Cathy [name], CPhT

## 2018-03-10 ENCOUNTER — Ambulatory Visit (INDEPENDENT_AMBULATORY_CARE_PROVIDER_SITE_OTHER): Payer: Medicare Other | Admitting: Internal Medicine

## 2018-03-10 ENCOUNTER — Encounter: Payer: Self-pay | Admitting: Internal Medicine

## 2018-03-10 VITALS — BP 124/72 | HR 66 | Temp 98.6°F | Ht 59.0 in | Wt 97.0 lb

## 2018-03-10 DIAGNOSIS — Z23 Encounter for immunization: Secondary | ICD-10-CM

## 2018-03-10 DIAGNOSIS — K56600 Partial intestinal obstruction, unspecified as to cause: Secondary | ICD-10-CM | POA: Diagnosis not present

## 2018-03-10 DIAGNOSIS — R739 Hyperglycemia, unspecified: Secondary | ICD-10-CM

## 2018-03-10 DIAGNOSIS — I10 Essential (primary) hypertension: Secondary | ICD-10-CM | POA: Diagnosis not present

## 2018-03-10 DIAGNOSIS — N3000 Acute cystitis without hematuria: Secondary | ICD-10-CM | POA: Diagnosis not present

## 2018-03-10 MED ORDER — ISOSORBIDE MONONITRATE ER 60 MG PO TB24: 60.0000 mg | ORAL_TABLET | Freq: Every day | ORAL | 3 refills | Status: DC

## 2018-03-10 MED ORDER — LEVOTHYROXINE SODIUM 125 MCG PO TABS: 62.5000 ug | ORAL_TABLET | Freq: Every day | ORAL | 3 refills | Status: DC

## 2018-03-10 NOTE — Patient Instructions (Signed)
Please continue all other medications as before, and refills have been done if requested.  Please have the pharmacy call with any other refills you may need.  Please continue your efforts at being more active, low cholesterol diet, and weight control.  You are otherwise up to date with prevention measures today.  Please keep your appointments with your specialists as you may have planned  Please return in 3 months, or sooner if needed, as we will likely need to recheck the thyroid and any other tests

## 2018-03-10 NOTE — Progress Notes (Signed)
Subjective:    Patient ID: Cathy [name], female    DOB: 1927-10-24, 85 y.o.   MRN: [id number]  HPI   Here to f/u with paid caretaker after recent hospn dec 23-jan 2; 85 y.o.femalewith a PMH of multiple medical problems including known hiatal hernia, chronic systolic CHF with an EF of 25-30% in 2016, but improved to 55-60% in 2018, hypertension, hyperlipidemia and diverticulosis who presents with a 24-hour history of abdominal pain associated with nausea; Underwent CT scan of the abdomen and pelvis which showed a hiatal hernia and a partial colonic obstruction. The ED physician consulted with general surgery as well.who recommended nonoperative management.  Diet was advanced as tolerated.  She was treated for ESBL E. coli UTI with IV Zosyn.  She also underwent CT head due to confusion, which found no acute intracranial change.  It also revealed remote infarct.  Physical therapy work with patient, recommended skilled nursing facility placement.  Patient as well as family declined and elected to take patient home with home health services. Today, pt states Pt denies chest pain, increased sob or doe, wheezing, orthopnea, PND, increased LE swelling, palpitations, dizziness or syncope.  Denies worsening reflux, abd pain, dysphagia, n/v, bowel change or blood.  Denies urinary symptoms such as dysuria, frequency, urgency, flank pain, hematuria or n/v, fever, chills.  No new complaints   Pt denies polydipsia, polyuria.    Past Medical History:  Diagnosis Date  . Allergic rhinitis   . Anemia   . Anxiety   . Chronic systolic CHF (congestive heart failure) ([place])    a. 2D Echo 02/02/15: EF 25-30%, akinesis of mid-apical anteroseptal myocardium, grade 3 DD, mod MR, severely dilated LA, mildly reduced RV systoluc function, mod dilated RA, mod TR, mildly increased PASP 26mmHg. b. 10/2016: EF 55-60% with no regional WMA. Grade 3 DD.   [name] CLOSTRIDIUM DIFFICILE COLITIS 05/26/2009   Qualifier: Diagnosis of  By: Nils Pyle  CMA ([place]), Mearl Latin    . Colitis    lymphocytic colitis feb 2011  . Coronary artery calcification    a. By CT 01/2015.  . Diverticulosis of colon   . DJD (degenerative joint disease)    gets epidural injections  . Dysphagia   . Hiatal hernia   . Hypercholesteremia   . Hypertension   . Iron deficiency   . Irritable bowel syndrome   . Liver lesion 02/18/2015  . Lumbar back pain   . Osteoporosis   . Paroxysmal atrial flutter ([place])    a. Dx 01/2015 - underwent DCCV 03/2015; takes Eliquis  . Sinus bradycardia   . Splenic lesion 02/18/2015  . Urinary incontinence   . Venous insufficiency   . Vitamin B12 deficiency    Past Surgical History:  Procedure Laterality Date  . ABDOMINAL HYSTERECTOMY    . CARDIOVERSION N/A 02/03/2015   Procedure: CARDIOVERSION;  Surgeon: Sueanne Margarita, MD;  Location: [facility] ENDOSCOPY;  Service: Cardiovascular;  Laterality: N/A;  . CARDIOVERSION N/A 03/10/2015   Procedure: CARDIOVERSION;  Surgeon: Dorothy Spark, MD;  Location: [facility];  Service: Cardiovascular;  Laterality: N/A;  . CARDIOVERSION N/A 01/07/2017   Procedure: CARDIOVERSION;  Surgeon: Pixie Casino, MD;  Location: [facility] ENDOSCOPY;  Service: Cardiovascular;  Laterality: N/A;  . cataract surgery    . decompressive lumbar laminectomy  06/2008   at L2-3, L3-4 and L4-5 by Dr. Ronnald Ramp  . LAPAROSCOPIC CHOLECYSTECTOMY  04/2009   Dr. Abran Cantor  . REPLACEMENT TOTAL KNEE    . TEE WITHOUT CARDIOVERSION N/A  02/03/2015   Procedure: TRANSESOPHAGEAL ECHOCARDIOGRAM (TEE);  Surgeon: Sueanne Margarita, MD;  Location[facility] ENDOSCOPY;  Service: Cardiovascular;  Laterality: N/A;    reports that she has quit smoking. Her smoking use included cigarettes. She has a 5.00 pack-year smoking history. She has never used smokeless tobacco. She reports that she does not drink alcohol or use drugs. family history includes Cancer in her brother, brother, and daughter; Coronary artery disease in her son; Diabetes in her brother and son;  Heart attack in her brother, mother, and sister; Kidney disease in her son; Stroke in her son. Allergies  Allergen Reactions  . Amoxicillin-Pot Clavulanate Diarrhea    Has patient had a PCN reaction causing immediate rash, facial/tongue/throat swelling, SOB or lightheadedness with hypotension: no Has patient had a PCN reaction causing severe rash involving mucus membranes or skin necrosis:  no Has patient had a PCN reaction that required hospitalization: no Has patient had a PCN reaction occurring within the last 10 years no If all of the above answers are "NO", then may proceed with Cephalosporin use.   . Sulfonamide Derivatives Other (See Comments)    unknown   Current Outpatient Medications on File Prior to Visit  Medication Sig Dispense Refill  . amiodarone (PACERONE) 200 MG tablet TAKE 1 TABLET DAILY (Patient taking differently: Take 200 mg by mouth daily. ) 90 tablet 0  . atorvastatin (LIPITOR) 20 MG tablet TAKE 1 TABLET DAILY AT 6 P.M. (Patient taking differently: Take 20 mg by mouth daily at 6 PM. ) 90 tablet 2  . calcium-vitamin D (OSCAL WITH D) 500-200 MG-UNIT per tablet Take 1 tablet by mouth daily.     . cholecalciferol (VITAMIN D) 1000 UNITS tablet Take 1,000 Units by mouth daily.    . diclofenac (FLECTOR) 1.3 % PTCH Place 1 patch onto the skin daily.    [name] ELIQUIS 2.5 MG TABS tablet TAKE 1 TABLET TWICE A DAY 180 tablet 4  . esomeprazole (NEXIUM) 40 MG capsule TAKE 1 CAPSULE DAILY (Patient taking differently: Take 40 mg by mouth daily. ) 90 capsule 3  . polyethylene glycol (MIRALAX / GLYCOLAX) packet Take 17 g by mouth 2 (two) times daily. (Patient taking differently: Take 17 g by mouth daily as needed for mild constipation. ) 28 each 0  . senna-docusate (SENOKOT-S) 8.6-50 MG tablet Take 1 tablet by mouth at bedtime. (Patient taking differently: Take 1 tablet by mouth at bedtime as needed for mild constipation. ) 30 tablet 0  . solifenacin (VESICARE) 10 MG tablet Take 1 tablet (10  mg total) by mouth daily. 90 tablet 1  . vitamin B-12 (CYANOCOBALAMIN) 1000 MCG tablet Take 1 tablet (1,000 mcg total) by mouth daily.    . furosemide (LASIX) 20 MG tablet Take 1 tablet (20 mg total) by mouth every other day. 30 tablet 6   No current facility-administered medications on file prior to visit.    Review of Systems  Constitutional: Negative for other unusual diaphoresis or sweats HENT: Negative for ear discharge or swelling Eyes: Negative for other worsening visual disturbances Respiratory: Negative for stridor or other swelling  Gastrointestinal: Negative for worsening distension or other blood Genitourinary: Negative for retention or other urinary change Musculoskeletal: Negative for other MSK pain or swelling Skin: Negative for color change or other new lesions Neurological: Negative for worsening tremors and other numbness  Psychiatric/Behavioral: Negative for worsening agitation or other fatigue All other system neg per pt    Objective:   Physical Exam BP 124/72  Pulse 66   Temp 98.6 F (37 C) (Oral)   Ht 4\' 11"  (1.499 m)   Wt 97 lb (44 kg)   SpO2 94%   BMI 19.59 kg/m  VS noted, examined seated in chair, Constitutional: Pt appears in NAD HENT: Head: NCAT.  Right Ear: External ear normal.  Left Ear: External ear normal.  Eyes: . Pupils are equal, round, and reactive to light. Conjunctivae and EOM are normal Nose: without d/c or deformity Neck: Neck supple. Gross normal ROM Cardiovascular: Normal rate and regular rhythm.   Pulmonary/Chest: Effort normal and breath sounds without rales or wheezing.  Abd:  Soft, NT, ND, + BS, no organomegaly Neurological: Pt is alert. At baseline orientation, motor grossly intact Skin: Skin is warm. No rashes, other new lesions, no LE edema Psychiatric: Pt behavior is normal without agitation  No other exam findings Lab Results  Component Value Date   WBC 8.6 02/25/2018   HGB 11.4 (L) 02/25/2018   HCT 34.6 (L)  02/25/2018   PLT 142 (L) 02/25/2018   GLUCOSE 92 02/28/2018   CHOL 128 04/11/2017   TRIG 54.0 04/11/2017   HDL 63.30 04/11/2017   LDLDIRECT 158.1 05/29/2008   LDLCALC 54 04/11/2017   ALT 21 02/20/2018   AST 26 02/20/2018   NA 144 02/28/2018   K 4.3 02/28/2018   CL 109 02/28/2018   CREATININE 0.96 02/28/2018   BUN 9 02/28/2018   CO2 28 02/28/2018   TSH 9.865 (H) 02/21/2018   INR 1.54 11/29/2016   HGBA1C 5.5 10/10/2017          Assessment & Plan:

## 2018-03-11 NOTE — Assessment & Plan Note (Signed)
clinicially resolved,  to f/u any worsening symptoms or concerns

## 2018-03-11 NOTE — Assessment & Plan Note (Signed)
clinicalty resolved, pt declines f/u urine testing,  to f/u any worsening symptoms or concerns

## 2018-03-11 NOTE — Assessment & Plan Note (Signed)
stable overall by history and exam, recent data reviewed with pt, and pt to continue medical treatment as before,  to f/u any worsening symptoms or concerns  

## 2018-03-21 ENCOUNTER — Telehealth: Payer: Self-pay | Admitting: Internal Medicine

## 2018-03-21 MED ORDER — NITROFURANTOIN MACROCRYSTAL 50 MG PO CAPS: 50.0000 mg | ORAL_CAPSULE | Freq: Two times a day (BID) | ORAL | 0 refills | Status: DC

## 2018-03-21 NOTE — Telephone Encounter (Signed)
Received UA result per [facility], c/w possible UT  Pt with PCN and sulfa allergies  OK for macrobid bid - done erx  [place] to let pt know

## 2018-03-21 NOTE — Telephone Encounter (Signed)
Pt has been informed and expressed understanding.  

## 2018-03-25 ENCOUNTER — Other Ambulatory Visit: Payer: Self-pay | Admitting: Internal Medicine

## 2018-03-29 ENCOUNTER — Telehealth: Payer: Self-pay | Admitting: Internal Medicine

## 2018-03-29 NOTE — Telephone Encounter (Signed)
Copied from [place] [phone]. Topic: Quick Communication - See Telephone Encounter >> Mar 29, 2018  3:56 PM Bea Graff, NT wrote: CRM for notification. See Telephone encounter for: 03/29/18. Kieth Brightly with [facility] calling to let the dr know the pts weight today was 115.8lbs. CB#: [phone]

## 2018-04-12 ENCOUNTER — Other Ambulatory Visit: Payer: Self-pay | Admitting: Cardiology

## 2018-04-12 DIAGNOSIS — R32 Unspecified urinary incontinence: Secondary | ICD-10-CM

## 2018-04-12 DIAGNOSIS — S80812D Abrasion, left lower leg, subsequent encounter: Secondary | ICD-10-CM

## 2018-04-12 DIAGNOSIS — R1310 Dysphagia, unspecified: Secondary | ICD-10-CM

## 2018-04-12 DIAGNOSIS — K5792 Diverticulitis of intestine, part unspecified, without perforation or abscess without bleeding: Secondary | ICD-10-CM

## 2018-04-12 DIAGNOSIS — S80811D Abrasion, right lower leg, subsequent encounter: Secondary | ICD-10-CM

## 2018-04-12 DIAGNOSIS — M810 Age-related osteoporosis without current pathological fracture: Secondary | ICD-10-CM

## 2018-04-12 DIAGNOSIS — I2510 Atherosclerotic heart disease of native coronary artery without angina pectoris: Secondary | ICD-10-CM

## 2018-04-12 DIAGNOSIS — R2689 Other abnormalities of gait and mobility: Secondary | ICD-10-CM

## 2018-04-12 DIAGNOSIS — I5042 Chronic combined systolic (congestive) and diastolic (congestive) heart failure: Secondary | ICD-10-CM | POA: Diagnosis not present

## 2018-04-12 DIAGNOSIS — Z7901 Long term (current) use of anticoagulants: Secondary | ICD-10-CM

## 2018-04-12 DIAGNOSIS — F649 Gender identity disorder, unspecified: Secondary | ICD-10-CM | POA: Diagnosis not present

## 2018-04-12 DIAGNOSIS — M150 Primary generalized (osteo)arthritis: Secondary | ICD-10-CM

## 2018-04-12 DIAGNOSIS — M6281 Muscle weakness (generalized): Secondary | ICD-10-CM

## 2018-04-12 DIAGNOSIS — I110 Hypertensive heart disease with heart failure: Secondary | ICD-10-CM

## 2018-04-12 DIAGNOSIS — F419 Anxiety disorder, unspecified: Secondary | ICD-10-CM

## 2018-04-12 DIAGNOSIS — I480 Paroxysmal atrial fibrillation: Secondary | ICD-10-CM

## 2018-04-14 ENCOUNTER — Ambulatory Visit: Payer: Medicare Other | Admitting: Internal Medicine

## 2018-04-18 ENCOUNTER — Ambulatory Visit: Payer: Medicare Other | Admitting: Internal Medicine

## 2018-04-18 ENCOUNTER — Telehealth: Payer: Self-pay | Admitting: Internal Medicine

## 2018-04-18 NOTE — Telephone Encounter (Signed)
Verbal orders given  

## 2018-04-18 NOTE — Telephone Encounter (Signed)
Copied from [place] [id number]. Topic: General - Other >> Apr 18, 2018 11:43 AM Yvette Rack wrote: Reason for CRM: Nurse Dorian Pod from [facility] [phone] calling stating that she put a dry dressing on pt  rt leg until appt that she has with Dr Jenny Reichmann she would like to get a call back to see what else that the nurse need to do the Nurse also would like to increase nurse visits to 2 wk 2 and they will do a re certification on pt

## 2018-04-19 ENCOUNTER — Ambulatory Visit (INDEPENDENT_AMBULATORY_CARE_PROVIDER_SITE_OTHER): Payer: Medicare Other | Admitting: Internal Medicine

## 2018-04-19 ENCOUNTER — Encounter: Payer: Self-pay | Admitting: Internal Medicine

## 2018-04-19 VITALS — BP 116/68 | HR 60 | Temp 98.1°F | Ht 59.0 in | Wt 108.0 lb

## 2018-04-19 DIAGNOSIS — S81811A Laceration without foreign body, right lower leg, initial encounter: Secondary | ICD-10-CM | POA: Diagnosis not present

## 2018-04-19 DIAGNOSIS — Z0001 Encounter for general adult medical examination with abnormal findings: Secondary | ICD-10-CM

## 2018-04-19 DIAGNOSIS — Z0000 Encounter for general adult medical examination without abnormal findings: Secondary | ICD-10-CM

## 2018-04-19 DIAGNOSIS — R739 Hyperglycemia, unspecified: Secondary | ICD-10-CM

## 2018-04-19 NOTE — Assessment & Plan Note (Signed)
Ok to continue supportive care, neosporin and gauze

## 2018-04-19 NOTE — Assessment & Plan Note (Signed)
stable overall by history and exam, recent data reviewed with pt, and pt to continue medical treatment as before,  to f/u any worsening symptoms or concerns  

## 2018-04-19 NOTE — Progress Notes (Signed)
Subjective:    Patient ID: Cathy [name], female    DOB: Feb 01, 1928, 85 y.o.   MRN: [id number]  HPI  Here for wellness and f/u with grandson;  Overall doing ok;  Pt denies Chest pain, worsening SOB, DOE, wheezing, orthopnea, PND, worsening LE edema, palpitations, dizziness or syncope.  Pt denies neurological change such as new headache, facial or extremity weakness.  Pt denies polydipsia, polyuria, or low sugar symptoms. Pt states overall good compliance with treatment and medications, good tolerability, and has been trying to follow appropriate diet.  Pt denies worsening depressive symptoms, suicidal ideation or panic. No fever, night sweats, wt loss, loss of appetite, or other constitutional symptoms.  Pt states good ability with ADL's, has low fall risk, home safety reviewed and adequate, no other significant changes in hearing or vision, and only occasionally active with exercise. Has left ganglion cyst recommended for surgury, but is wary of having this done, per Dr Amedeo Plenty.  Also simply dropped a cup to distal RLE x 2 wks ago with a rather large deep skin tear, now with a dark covering but no fever, redness, swelling.  OVerall, just feels miserable over her declining mobility.  Walks with walker, no recent falls Past Medical History:  Diagnosis Date  . Allergic rhinitis   . Anemia   . Anxiety   . Chronic systolic CHF (congestive heart failure) ([place])    a. 2D Echo 02/02/15: EF 25-30%, akinesis of mid-apical anteroseptal myocardium, grade 3 DD, mod MR, severely dilated LA, mildly reduced RV systoluc function, mod dilated RA, mod TR, mildly increased PASP 76mmHg. b. 10/2016: EF 55-60% with no regional WMA. Grade 3 DD.   [name] CLOSTRIDIUM DIFFICILE COLITIS 05/26/2009   Qualifier: Diagnosis of  By: Nils Pyle CMA ([place]), Mearl Latin    . Colitis    lymphocytic colitis feb 2011  . Coronary artery calcification    a. By CT 01/2015.  . Diverticulosis of colon   . DJD (degenerative joint disease)    gets  epidural injections  . Dysphagia   . Hiatal hernia   . Hypercholesteremia   . Hypertension   . Iron deficiency   . Irritable bowel syndrome   . Liver lesion 02/18/2015  . Lumbar back pain   . Osteoporosis   . Paroxysmal atrial flutter ([place])    a. Dx 01/2015 - underwent DCCV 03/2015; takes Eliquis  . Sinus bradycardia   . Splenic lesion 02/18/2015  . Urinary incontinence   . Venous insufficiency   . Vitamin B12 deficiency    Past Surgical History:  Procedure Laterality Date  . ABDOMINAL HYSTERECTOMY    . CARDIOVERSION N/A 02/03/2015   Procedure: CARDIOVERSION;  Surgeon: Sueanne Margarita, MD;  Location: [facility] ENDOSCOPY;  Service: Cardiovascular;  Laterality: N/A;  . CARDIOVERSION N/A 03/10/2015   Procedure: CARDIOVERSION;  Surgeon: Dorothy Spark, MD;  Location: [place];  Service: Cardiovascular;  Laterality: N/A;  . CARDIOVERSION N/A 01/07/2017   Procedure: CARDIOVERSION;  Surgeon: Pixie Casino, MD;  Location: [facility] ENDOSCOPY;  Service: Cardiovascular;  Laterality: N/A;  . cataract surgery    . decompressive lumbar laminectomy  06/2008   at L2-3, L3-4 and L4-5 by Dr. Ronnald Ramp  . LAPAROSCOPIC CHOLECYSTECTOMY  04/2009   Dr. Abran Cantor  . REPLACEMENT TOTAL KNEE    . TEE WITHOUT CARDIOVERSION N/A 02/03/2015   Procedure: TRANSESOPHAGEAL ECHOCARDIOGRAM (TEE);  Surgeon: Sueanne Margarita, MD;  Location: [facility] ENDOSCOPY;  Service: Cardiovascular;  Laterality: N/A;    reports that she has  quit smoking. Her smoking use included cigarettes. She has a 5.00 pack-year smoking history. She has never used smokeless tobacco. She reports that she does not drink alcohol or use drugs. family history includes Cancer in her brother, brother, and daughter; Coronary artery disease in her son; Diabetes in her brother and son; Heart attack in her brother, mother, and sister; Kidney disease in her son; Stroke in her son. Allergies  Allergen Reactions  . Amoxicillin-Pot Clavulanate Diarrhea    Has patient had a PCN  reaction causing immediate rash, facial/tongue/throat swelling, SOB or lightheadedness with hypotension: no Has patient had a PCN reaction causing severe rash involving mucus membranes or skin necrosis:  no Has patient had a PCN reaction that required hospitalization: no Has patient had a PCN reaction occurring within the last 10 years no If all of the above answers are "NO", then may proceed with Cephalosporin use.   . Sulfonamide Derivatives Other (See Comments)    unknown   Current Outpatient Medications on File Prior to Visit  Medication Sig Dispense Refill  . amiodarone (PACERONE) 200 MG tablet TAKE 1 TABLET DAILY 90 tablet 2  . atorvastatin (LIPITOR) 20 MG tablet TAKE 1 TABLET DAILY AT 6 P.M. 90 tablet 1  . calcium-vitamin D (OSCAL WITH D) 500-200 MG-UNIT per tablet Take 1 tablet by mouth daily.     . cholecalciferol (VITAMIN D) 1000 UNITS tablet Take 1,000 Units by mouth daily.    . diclofenac (FLECTOR) 1.3 % PTCH Place 1 patch onto the skin daily.    [name] ELIQUIS 2.5 MG TABS tablet TAKE 1 TABLET TWICE A DAY 180 tablet 4  . esomeprazole (NEXIUM) 40 MG capsule TAKE 1 CAPSULE DAILY (Patient taking differently: Take 40 mg by mouth daily. ) 90 capsule 3  . isosorbide mononitrate (IMDUR) 60 MG 24 hr tablet Take 1 tablet (60 mg total) by mouth daily. 90 tablet 3  . levothyroxine (SYNTHROID, LEVOTHROID) 125 MCG tablet Take 0.5 tablets (62.5 mcg total) by mouth daily. 90 tablet 3  . nitrofurantoin (MACRODANTIN) 50 MG capsule Take 1 capsule (50 mg total) by mouth 2 (two) times daily. 14 capsule 0  . polyethylene glycol (MIRALAX / GLYCOLAX) packet Take 17 g by mouth 2 (two) times daily. (Patient taking differently: Take 17 g by mouth daily as needed for mild constipation. ) 28 each 0  . senna-docusate (SENOKOT-S) 8.6-50 MG tablet Take 1 tablet by mouth at bedtime. (Patient taking differently: Take 1 tablet by mouth at bedtime as needed for mild constipation. ) 30 tablet 0  . solifenacin (VESICARE)  10 MG tablet Take 1 tablet (10 mg total) by mouth daily. 90 tablet 1  . vitamin B-12 (CYANOCOBALAMIN) 1000 MCG tablet Take 1 tablet (1,000 mcg total) by mouth daily.    . furosemide (LASIX) 20 MG tablet Take 1 tablet (20 mg total) by mouth every other day. 30 tablet 6   No current facility-administered medications on file prior to visit.    Review of Systems Constitutional: Negative for other unusual diaphoresis, sweats, appetite or weight changes HENT: Negative for other worsening hearing loss, ear pain, facial swelling, mouth sores or neck stiffness.   Eyes: Negative for other worsening pain, redness or other visual disturbance.  Respiratory: Negative for other stridor or swelling Cardiovascular: Negative for other palpitations or other chest pain  Gastrointestinal: Negative for worsening diarrhea or loose stools, blood in stool, distention or other pain Genitourinary: Negative for hematuria, flank pain or other change in urine volume.  Musculoskeletal:  Negative for myalgias or other joint swelling.  Skin: Negative for other color change, or other wound or worsening drainage.  Neurological: Negative for other syncope or numbness. Hematological: Negative for other adenopathy or swelling Psychiatric/Behavioral: Negative for hallucinations, other worsening agitation, SI, self-injury, or new decreased concentration All other system neg per pt    Objective:   Physical Exam BP 116/68   Pulse 60   Temp 98.1 F (36.7 C) (Oral)   Ht 4\' 11"  (1.499 m)   Wt 108 lb (49 kg)   SpO2 93%   BMI 21.81 kg/m  VS noted,  Constitutional: Pt is oriented to person, place, and time. Appears well-developed and well-nourished, in no significant distress and comfortable Head: Normocephalic and atraumatic  Eyes: Conjunctivae and EOM are normal. Pupils are equal, round, and reactive to light Right Ear: External ear normal without discharge Left Ear: External ear normal without discharge Nose: Nose without  discharge or deformity Mouth/Throat: Oropharynx is without other ulcerations and moist  Neck: Normal range of motion. Neck supple. No JVD present. No tracheal deviation present or significant neck LA or mass Cardiovascular: Normal rate, regular rhythm, normal heart sounds and intact distal pulses.   Pulmonary/Chest: WOB normal and breath sounds without rales or wheezing  Abdominal: Soft. Bowel sounds are normal. NT. No HSM  Musculoskeletal: Normal range of motion. Exhibits no edema Lymphadenopathy: Has no other cervical adenopathy.  Neurological: Pt is alert and oriented to person, place, and time. Pt has normal reflexes. No cranial nerve deficit. Motor grossly intact, Gait intact Skin: Skin is warm and dry. No rash noted or new ulcerations but has healing large skin tear to distal anterior right leg with scabbing but no red, swelling, tender, or abscess Psychiatric:  Has normal mood and affect. Behavior is normal without agitation No other exam findings Lab Results  Component Value Date   WBC 8.6 02/25/2018   HGB 11.4 (L) 02/25/2018   HCT 34.6 (L) 02/25/2018   PLT 142 (L) 02/25/2018   GLUCOSE 92 02/28/2018   CHOL 128 04/11/2017   TRIG 54.0 04/11/2017   HDL 63.30 04/11/2017   LDLDIRECT 158.1 05/29/2008   LDLCALC 54 04/11/2017   ALT 21 02/20/2018   AST 26 02/20/2018   NA 144 02/28/2018   K 4.3 02/28/2018   CL 109 02/28/2018   CREATININE 0.96 02/28/2018   BUN 9 02/28/2018   CO2 28 02/28/2018   TSH 9.865 (H) 02/21/2018   INR 1.54 11/29/2016   HGBA1C 5.5 10/10/2017          Assessment & Plan:

## 2018-04-19 NOTE — Patient Instructions (Signed)
Please continue all other medications as before, and refills have been done if requested.  Please have the pharmacy call with any other refills you may need.  Please continue your efforts at being more active, low cholesterol diet, and weight control.  You are otherwise up to date with prevention measures today.  Please keep your appointments with your specialists as you may have planned  Please return in 6 months, or sooner if needed 

## 2018-04-19 NOTE — Assessment & Plan Note (Signed)

## 2018-06-15 ENCOUNTER — Other Ambulatory Visit: Payer: Self-pay | Admitting: Internal Medicine

## 2018-07-18 NOTE — Progress Notes (Deleted)
Office Visit Note  Patient: Cathy [name]             Date of Birth: January 29, 1928           MRN: [id number]             PCP: Biagio Borg, MD Referring: Biagio Borg, MD Visit Date: 08/01/2018 Occupation: @GUAROCC @  Subjective:  No chief complaint on file.  DEXA scan performed on 05/03/2017 which revealed a T score of -5.2. She was previously treated with 2 years of Forteo? and d/c Prolia in 2017.  Discussed starting Prolia at last visit but patient decided to hold due to hospitalization.   History of Present Illness: Cathy [name] is a 85 y.o. female ***   Activities of Daily Living:  Patient reports morning stiffness for *** {minute/hour:19697}.   Patient {ACTIONS;DENIES/REPORTS:21021675::"Denies"} nocturnal pain.  Difficulty dressing/grooming: {ACTIONS;DENIES/REPORTS:21021675::"Denies"} Difficulty climbing stairs: {ACTIONS;DENIES/REPORTS:21021675::"Denies"} Difficulty getting out of chair: {ACTIONS;DENIES/REPORTS:21021675::"Denies"} Difficulty using hands for taps, buttons, cutlery, and/or writing: {ACTIONS;DENIES/REPORTS:21021675::"Denies"}  No Rheumatology ROS completed.   PMFS History:  Patient Active Problem List   Diagnosis Date Noted  . Noninfected skin tear of leg, right, initial encounter 04/19/2018  . Bowel obstruction ([place]) 02/21/2018  . Hypothyroidism 02/21/2018  . Osteoarthritis of left shoulder 08/10/2017  . Pain in joint of left shoulder 08/10/2017  . Mass of joint of left wrist 06/22/2017  . Arthritis of left wrist 06/21/2017  . Carpal tunnel syndrome 06/21/2017  . Tenosynovitis of left wrist 06/21/2017  . Fracture of distal end of humerus 03/25/2017  . Closed supracondylar fracture of left humerus 01/15/2017  . Gait disorder 01/15/2017  . Recurrent falls 01/15/2017  . Rib fractures 11/28/2016  . Skin lesion 09/30/2016  . Ganglion cyst of dorsum of left wrist 09/30/2016  . Rotator cuff arthropathy, right 04/26/2016  . Hyperglycemia 04/01/2016  . Right  arm pain 04/01/2016  . Wrist swelling 09/30/2015  . Preventative health care 04/02/2015  . UTI (urinary tract infection) 04/02/2015  . Chronic combined systolic and diastolic CHF (congestive heart failure) ([place]) 03/17/2015  . Coronary artery calcification seen on CT scan 03/17/2015  . Sinus bradycardia 03/17/2015  . Splenic lesion 02/18/2015  . Liver lesion 02/18/2015  . CAD (coronary artery disease), native coronary artery 02/03/2015  . Atrial flutter ([place]) 02/02/2015  . Fatigue 01/31/2015  . Abnormal CT of the abdomen 01/31/2015  . Prolonged Q-T interval on ECG 01/31/2015  . Hepatic lesion   . Constipation 12/03/2014  . Lower extremity edema 04/25/2014  . Peripheral neuropathy ([place]) 12/10/2013  . Overactive bladder 12/27/2011  . Osteoporosis 12/27/2011  . VITAMIN B12 DEFICIENCY 05/03/2010  . IRON DEFICIENCY 05/03/2010  . Anemia 05/03/2010  . VENOUS INSUFFICIENCY 01/27/2009  . BACK PAIN, LUMBAR 01/27/2008  . URINARY INCONTINENCE 08/23/2007  . DIVERTICULOSIS OF COLON 04/10/2007  . HYPERCHOLESTEROLEMIA 02/13/2007  . ANXIETY 02/13/2007  . Essential hypertension 02/13/2007  . HIATAL HERNIA 02/13/2007  . Irritable bowel syndrome 02/13/2007  . DEGENERATIVE JOINT DISEASE 02/13/2007  . Allergic rhinitis 02/13/2007    Past Medical History:  Diagnosis Date  . Allergic rhinitis   . Anemia   . Anxiety   . Chronic systolic CHF (congestive heart failure) ([place])    a. 2D Echo 02/02/15: EF 25-30%, akinesis of mid-apical anteroseptal myocardium, grade 3 DD, mod MR, severely dilated LA, mildly reduced RV systoluc function, mod dilated RA, mod TR, mildly increased PASP 22mmHg. b. 10/2016: EF 55-60% with no regional WMA. Grade 3 DD.   [name] CLOSTRIDIUM  DIFFICILE COLITIS 05/26/2009   Qualifier: Diagnosis of  By: Nils Pyle CMA ([place]), Mearl Latin    . Colitis    lymphocytic colitis feb 2011  . Coronary artery calcification    a. By CT 01/2015.  . Diverticulosis of colon   . DJD (degenerative joint  disease)    gets epidural injections  . Dysphagia   . Hiatal hernia   . Hypercholesteremia   . Hypertension   . Iron deficiency   . Irritable bowel syndrome   . Liver lesion 02/18/2015  . Lumbar back pain   . Osteoporosis   . Paroxysmal atrial flutter ([place])    a. Dx 01/2015 - underwent DCCV 03/2015; takes Eliquis  . Sinus bradycardia   . Splenic lesion 02/18/2015  . Urinary incontinence   . Venous insufficiency   . Vitamin B12 deficiency     Family History  Problem Relation Age of Onset  . Cancer Brother        lung  . Diabetes Son   . Coronary artery disease Son   . Kidney disease Son   . Stroke Son   . Heart attack Mother   . Cancer Brother        colon  . Cancer Daughter        cervical, lung   . Heart attack Brother   . Diabetes Brother   . Heart attack Sister   . Hypertension Neg Hx   . Fainting Neg Hx    Past Surgical History:  Procedure Laterality Date  . ABDOMINAL HYSTERECTOMY    . CARDIOVERSION N/A 02/03/2015   Procedure: CARDIOVERSION;  Surgeon: Sueanne Margarita, MD;  Location: [facility] ENDOSCOPY;  Service: Cardiovascular;  Laterality: N/A;  . CARDIOVERSION N/A 03/10/2015   Procedure: CARDIOVERSION;  Surgeon: Dorothy Spark, MD;  Location: [place];  Service: Cardiovascular;  Laterality: N/A;  . CARDIOVERSION N/A 01/07/2017   Procedure: CARDIOVERSION;  Surgeon: Pixie Casino, MD;  Location: [facility] ENDOSCOPY;  Service: Cardiovascular;  Laterality: N/A;  . cataract surgery    . decompressive lumbar laminectomy  06/2008   at L2-3, L3-4 and L4-5 by Dr. Ronnald Ramp  . LAPAROSCOPIC CHOLECYSTECTOMY  04/2009   Dr. Abran Cantor  . REPLACEMENT TOTAL KNEE    . TEE WITHOUT CARDIOVERSION N/A 02/03/2015   Procedure: TRANSESOPHAGEAL ECHOCARDIOGRAM (TEE);  Surgeon: Sueanne Margarita, MD;  Location: [facility] ENDOSCOPY;  Service: Cardiovascular;  Laterality: N/A;   Social History   Social History Narrative  . Not on file   Immunization History  Administered Date(s) Administered  . H1N1  02/27/2008  . Influenza Split 12/23/2010, 11/15/2011  . Influenza Whole 11/29/2007  . Influenza, High Dose Seasonal PF 12/10/2013, 04/01/2016, 01/12/2017, 03/10/2018  . Influenza,inj,Quad PF,6+ Mos 12/05/2012, 12/03/2014  . Pneumococcal Conjugate-13 06/17/2014  . Pneumococcal Polysaccharide-23 09/06/2011  . Td 06/17/2014  . Zoster 12/07/2011     Objective: Vital Signs: There were no vitals taken for this visit.   Physical Exam   Musculoskeletal Exam: ***  CDAI Exam: CDAI Score: Not documented Patient Global Assessment: Not documented; Provider Global Assessment: Not documented Swollen: Not documented; Tender: Not documented Joint Exam   Not documented   There is currently no information documented on the homunculus. Go to the Rheumatology activity and complete the homunculus joint exam.  Investigation: No additional findings.  Imaging: No results found.  Recent Labs: Lab Results  Component Value Date   WBC 8.6 02/25/2018   HGB 11.4 (L) 02/25/2018   PLT 142 (L) 02/25/2018   NA 144 02/28/2018  K 4.3 02/28/2018   CL 109 02/28/2018   CO2 28 02/28/2018   GLUCOSE 92 02/28/2018   BUN 9 02/28/2018   CREATININE 0.96 02/28/2018   BILITOT 0.7 02/20/2018   ALKPHOS 49 02/20/2018   AST 26 02/20/2018   ALT 21 02/20/2018   PROT 5.5 (L) 02/20/2018   ALBUMIN 3.1 (L) 02/20/2018   CALCIUM 9.1 02/28/2018   GFRAA >60 02/28/2018    Speciality Comments: No specialty comments available.  Procedures:  No procedures performed Allergies: Amoxicillin-pot clavulanate and Sulfonamide derivatives   Assessment / Plan:     Visit Diagnoses: No diagnosis found.   Orders: No orders of the defined types were placed in this encounter.  No orders of the defined types were placed in this encounter.   Face-to-face time spent with patient was *** minutes. Greater than 50% of time was spent in counseling and coordination of care.  Follow-Up Instructions: No follow-ups on file.    Ofilia Neas, PA-C  Note - This record has been created using Dragon software.  Chart creation errors have been sought, but may not always  have been located. Such creation errors do not reflect on  the standard of medical care.

## 2018-07-31 ENCOUNTER — Telehealth: Payer: Self-pay | Admitting: Rheumatology

## 2018-07-31 NOTE — Telephone Encounter (Signed)
Paient's daughter states patient has not gotten injection for her bones yet. No one called her to get her scheduled. Patient had appt scheduled here 08/01/18, but had to cancel due to another appt scheduled elsewhere. Please call daughter to advise.

## 2018-07-31 NOTE — Telephone Encounter (Signed)
Can you please check status of prolia?

## 2018-07-31 NOTE — Telephone Encounter (Signed)
alled patient and caregiver.  Reminded that we spoke in December notifying of approval and high co-pay.  Patient declined and said she would call back when she was ready.  They will revisit and call back if they decide to proceed.  Her PA is good through 01/30/2019.  All questions encouraged and answered.  Instructed patient to call with any further questions or concerns.  Mariella Saa, PharmD, [facility] Rheumatology Clinical Pharmacist

## 2018-08-01 ENCOUNTER — Ambulatory Visit: Payer: Self-pay | Admitting: Physician Assistant

## 2018-09-23 ENCOUNTER — Other Ambulatory Visit: Payer: Self-pay | Admitting: Internal Medicine

## 2018-10-02 ENCOUNTER — Ambulatory Visit: Payer: Medicare Other | Admitting: Family Medicine

## 2018-11-24 ENCOUNTER — Telehealth: Payer: Self-pay

## 2018-11-24 NOTE — Telephone Encounter (Signed)
**Note De-Identified  Obfuscation** We received a Eliquis PA form from [facility]. Because I am working from home today I called [facility] and did the PA over the phone with [place].  Per [place] this PA has been approved and we will be receiving an approval fax from them shortly with the approval details.

## 2018-11-27 NOTE — Telephone Encounter (Signed)
**Note De-Identified  Obfuscation** Letter received from [facility] stating that they have approved this Eliquis PA. Approval good from 10/25/2018 through 11/24/2019 Case ID: [id number]  The pts pharmacy is aware.

## 2019-01-07 ENCOUNTER — Other Ambulatory Visit: Payer: Self-pay | Admitting: Cardiology

## 2019-03-20 ENCOUNTER — Ambulatory Visit: Payer: Medicare Other

## 2019-03-23 ENCOUNTER — Other Ambulatory Visit: Payer: Self-pay | Admitting: Internal Medicine

## 2019-03-23 DIAGNOSIS — I4892 Unspecified atrial flutter: Secondary | ICD-10-CM

## 2019-03-23 MED ORDER — ISOSORBIDE MONONITRATE ER 60 MG PO TB24: 60.0000 mg | ORAL_TABLET | Freq: Every day | ORAL | 0 refills | Status: DC

## 2019-03-23 MED ORDER — SOLIFENACIN SUCCINATE 10 MG PO TABS: 10.0000 mg | ORAL_TABLET | Freq: Every day | ORAL | 0 refills | Status: DC

## 2019-03-23 MED ORDER — LEVOTHYROXINE SODIUM 125 MCG PO TABS: 62.5000 ug | ORAL_TABLET | Freq: Every day | ORAL | 0 refills | Status: DC

## 2019-03-23 MED ORDER — ATORVASTATIN CALCIUM 20 MG PO TABS: ORAL_TABLET | ORAL | 0 refills | Status: DC

## 2019-03-23 MED ORDER — ESOMEPRAZOLE MAGNESIUM 40 MG PO CPDR: 40.0000 mg | DELAYED_RELEASE_CAPSULE | Freq: Every day | ORAL | 3 refills | Status: DC

## 2019-03-23 MED ORDER — FUROSEMIDE 20 MG PO TABS: 20.0000 mg | ORAL_TABLET | ORAL | 0 refills | Status: DC

## 2019-03-29 ENCOUNTER — Ambulatory Visit: Payer: Medicare Other

## 2019-04-03 ENCOUNTER — Ambulatory Visit: Payer: Medicare Other

## 2019-04-05 ENCOUNTER — Ambulatory Visit: Payer: Medicare Other | Attending: Internal Medicine

## 2019-04-05 DIAGNOSIS — Z23 Encounter for immunization: Secondary | ICD-10-CM | POA: Insufficient documentation

## 2019-04-05 NOTE — Progress Notes (Signed)
   Covid-19 Vaccination Clinic  Name:  Cathy [name]    MRN: [id number] DOB: [birth year]  04/05/2019  [name] was observed post Covid-19 immunization for 15 minutes without incidence. She was provided with Vaccine Information Sheet and instruction to access the V-Safe system.   [name] was instructed to call 911 with any severe reactions post vaccine: [name] Difficulty breathing  . Swelling of your face and throat  . A fast heartbeat  . A bad rash all over your body  . Dizziness and weakness    Immunizations Administered    Name Date Dose VIS Date Route   Pfizer COVID-19 Vaccine 04/05/2019  5:13 PM 0.3 mL 02/09/2019 Intramuscular   Manufacturer: [place]   Lot: [id number]   [place]: [id number]

## 2019-04-16 ENCOUNTER — Telehealth: Payer: Self-pay

## 2019-04-16 NOTE — Telephone Encounter (Signed)
Refill:  ISOSORBIDE (IMDUR) 60 MG 24 hr tablet.  Pharmacy:  [facility] Per Dalbert [place] daughter request [phone] or [phone] cell.

## 2019-04-17 MED ORDER — ISOSORBIDE MONONITRATE ER 60 MG PO TB24: 60.0000 mg | ORAL_TABLET | Freq: Every day | ORAL | 0 refills | Status: DC

## 2019-04-17 NOTE — Telephone Encounter (Signed)
Rf rq for isosorbide to [facility].   LOV with PCP was Feb of 2020. Are you okay refilling for 90 days.   We can contact pt/pt dtr to schedule visit (in person or virtual).

## 2019-04-17 NOTE — Telephone Encounter (Signed)
Please refill as per office routine med refill policy (all routine meds refilled for 3 mo or monthly per pt preference up to one year from last visit, then month to month grace period for 3 mo, then further med refills will have to be denied)  

## 2019-04-30 ENCOUNTER — Other Ambulatory Visit: Payer: Self-pay | Admitting: Internal Medicine

## 2019-04-30 NOTE — Telephone Encounter (Signed)
New message:   1.Medication Requested:esomeprazole (NEXIUM) 40 MG capsule levothyroxine (SYNTHROID) 125 MCG tablet isosorbide mononitrate (IMDUR) 60 MG 24 hr tablet  2. Pharmacy (Name, [place], City):[facility] [place] [place] [facility]  3. On Med List: Yes  4. Last Visit with PCP: 04/2018  5. Next visit date with PCP:  Pt's daughter plans to bring patient in sometime in May. She states she has discussed this with Dr. Jenny Reichmann that she just doesn't feel it safe until after she gets her second dosage of the vaccination. I have advised the pt's daughter that an appt may have to be made so please advise on this. She also states these meds need prior authorization and gave a number of [phone].

## 2019-05-01 ENCOUNTER — Ambulatory Visit: Payer: Medicare Other | Attending: Internal Medicine

## 2019-05-01 DIAGNOSIS — Z23 Encounter for immunization: Secondary | ICD-10-CM

## 2019-05-01 MED ORDER — ESOMEPRAZOLE MAGNESIUM 40 MG PO CPDR: 40.0000 mg | DELAYED_RELEASE_CAPSULE | Freq: Every day | ORAL | 0 refills | Status: DC

## 2019-05-01 MED ORDER — LEVOTHYROXINE SODIUM 125 MCG PO TABS: 62.5000 ug | ORAL_TABLET | Freq: Every day | ORAL | 0 refills | Status: DC

## 2019-05-01 MED ORDER — ISOSORBIDE MONONITRATE ER 60 MG PO TB24: 60.0000 mg | ORAL_TABLET | Freq: Every day | ORAL | 0 refills | Status: DC

## 2019-05-01 NOTE — Telephone Encounter (Signed)
Nexium was sent on 03/23/19, will resend w/other requested refills.Marland KitchenJohny Chess

## 2019-05-01 NOTE — Progress Notes (Signed)
   Covid-19 Vaccination Clinic  Name:  Cathy [name]    MRN: [id number] DOB: [birth year]  05/01/2019  [name] was observed post Covid-19 immunization for 15 minutes without incident. She was provided with Vaccine Information Sheet and instruction to access the V-Safe system.   [name] was instructed to call 911 with any severe reactions post vaccine: [name] Difficulty breathing  . Swelling of face and throat  . A fast heartbeat  . A bad rash all over body  . Dizziness and weakness   Immunizations Administered    Name Date Dose VIS Date Route   Pfizer COVID-19 Vaccine 05/01/2019  8:40 AM 0.3 mL 02/09/2019 Intramuscular   Manufacturer: [place]   Lot: [id number]   [place]: [id number]

## 2019-05-15 NOTE — Telephone Encounter (Signed)
New message:   [facility] and they state they are needing prior authorization for all of these medications. Please advise.   esomeprazole (NEXIUM) 40 MG capsule levothyroxine (SYNTHROID) 125 MCG tablet isosorbide mononitrate (IMDUR) 60 MG 24 hr tablet

## 2019-05-15 NOTE — Telephone Encounter (Signed)
New message:   Pt's daughter states she also needs a temporary [facility] [address]. Please advise.

## 2019-05-17 ENCOUNTER — Other Ambulatory Visit: Payer: Self-pay

## 2019-05-17 MED ORDER — LEVOTHYROXINE SODIUM 125 MCG PO TABS: 62.5000 ug | ORAL_TABLET | Freq: Every day | ORAL | 0 refills | Status: DC

## 2019-05-17 MED ORDER — LEVOTHYROXINE SODIUM 125 MCG PO TABS: 62.5000 ug | ORAL_TABLET | Freq: Every day | ORAL | 1 refills | Status: DC

## 2019-05-17 MED ORDER — ESOMEPRAZOLE MAGNESIUM 40 MG PO CPDR: 40.0000 mg | DELAYED_RELEASE_CAPSULE | Freq: Every day | ORAL | 0 refills | Status: DC

## 2019-05-17 MED ORDER — ESOMEPRAZOLE MAGNESIUM 40 MG PO CPDR: 40.0000 mg | DELAYED_RELEASE_CAPSULE | Freq: Every day | ORAL | 1 refills | Status: DC

## 2019-05-17 MED ORDER — ISOSORBIDE MONONITRATE ER 60 MG PO TB24: 60.0000 mg | ORAL_TABLET | Freq: Every day | ORAL | 0 refills | Status: DC

## 2019-05-17 NOTE — Telephone Encounter (Signed)
30 day supplies sent into her local pharmacy to cover her until her mail order arrives.  Went back and sent additional scripts to [facility] order as well with 90 supply and 1 refill as patient is due for in-office visit in May.

## 2019-06-04 ENCOUNTER — Telehealth: Payer: Self-pay

## 2019-06-04 NOTE — Telephone Encounter (Signed)
Key: [id number]

## 2019-06-06 MED ORDER — PANTOPRAZOLE SODIUM 40 MG PO TBEC: 40.0000 mg | DELAYED_RELEASE_TABLET | Freq: Every day | ORAL | 3 refills | Status: DC

## 2019-06-06 NOTE — Telephone Encounter (Signed)
The PA has been denied (esomeprazole 40 mg).   Do you want to send in an alternative?

## 2019-06-06 NOTE — Telephone Encounter (Signed)
Ok to try change to protonix 40 qd - I will send

## 2019-06-09 ENCOUNTER — Other Ambulatory Visit: Payer: Self-pay | Admitting: Internal Medicine

## 2019-06-10 NOTE — Telephone Encounter (Signed)
Please refill as per office routine med refill policy (all routine meds refilled for 3 mo or monthly per pt preference up to one year from last visit, then month to month grace period for 3 mo, then further med refills will have to be denied)  

## 2019-06-12 ENCOUNTER — Other Ambulatory Visit: Payer: Self-pay | Admitting: Cardiology

## 2019-06-12 DIAGNOSIS — I4892 Unspecified atrial flutter: Secondary | ICD-10-CM

## 2019-06-12 MED ORDER — APIXABAN 2.5 MG PO TABS: 2.5000 mg | ORAL_TABLET | Freq: Two times a day (BID) | ORAL | 0 refills | Status: DC

## 2019-06-12 MED ORDER — ATORVASTATIN CALCIUM 20 MG PO TABS: ORAL_TABLET | ORAL | 0 refills | Status: DC

## 2019-06-12 MED ORDER — AMIODARONE HCL 200 MG PO TABS: 200.0000 mg | ORAL_TABLET | Freq: Every day | ORAL | 0 refills | Status: DC

## 2019-06-12 NOTE — Telephone Encounter (Signed)
*  STAT* If patient is at the pharmacy, call can be transferred to refill team.   1. Which medications need to be refilled? (please list name of each medication and dose if known)  ELIQUIS 2.5 MG TABS tablet  amiodarone (PACERONE) 200 MG tablet    atorvastatin (LIPITOR) 20 MG tablet   2. Which pharmacy/location (including street and city if local pharmacy) is medication to be sent to?  [facility], [place] AT Portal to Registered [employer] Sites  3. Do they need a 30 day or 90 day supply? 90 day supply

## 2019-06-12 NOTE — Telephone Encounter (Signed)
Pt's medications were sent to pt's pharmacy as requested. Pt needs a refill on Eliquis. Please address

## 2019-06-12 NOTE — Telephone Encounter (Signed)
Last OV 11/01/17 but has appointment scheduled in June Will give 3 months will no refills

## 2019-06-15 ENCOUNTER — Other Ambulatory Visit: Payer: Self-pay

## 2019-06-15 MED ORDER — ISOSORBIDE MONONITRATE ER 60 MG PO TB24: 60.0000 mg | ORAL_TABLET | Freq: Every day | ORAL | 0 refills | Status: DC

## 2019-06-28 ENCOUNTER — Encounter (HOSPITAL_COMMUNITY): Payer: Self-pay

## 2019-06-28 ENCOUNTER — Inpatient Hospital Stay (HOSPITAL_COMMUNITY)
Admission: EM | Admit: 2019-06-28 | Discharge: 2019-07-03 | DRG: 481 | Disposition: A | Discharge status: Skilled Nursing Facility | Payer: Medicare Other | Attending: Internal Medicine | Admitting: Internal Medicine

## 2019-06-28 ENCOUNTER — Emergency Department (HOSPITAL_COMMUNITY): Payer: Medicare Other

## 2019-06-28 ENCOUNTER — Other Ambulatory Visit: Payer: Self-pay

## 2019-06-28 DIAGNOSIS — E7800 Pure hypercholesterolemia, unspecified: Secondary | ICD-10-CM | POA: Diagnosis present

## 2019-06-28 DIAGNOSIS — S79912A Unspecified injury of left hip, initial encounter: Secondary | ICD-10-CM | POA: Diagnosis not present

## 2019-06-28 DIAGNOSIS — M810 Age-related osteoporosis without current pathological fracture: Secondary | ICD-10-CM | POA: Diagnosis present

## 2019-06-28 DIAGNOSIS — N289 Disorder of kidney and ureter, unspecified: Secondary | ICD-10-CM | POA: Diagnosis not present

## 2019-06-28 DIAGNOSIS — S72002A Fracture of unspecified part of neck of left femur, initial encounter for closed fracture: Secondary | ICD-10-CM | POA: Diagnosis present

## 2019-06-28 DIAGNOSIS — I4892 Unspecified atrial flutter: Secondary | ICD-10-CM

## 2019-06-28 DIAGNOSIS — E785 Hyperlipidemia, unspecified: Secondary | ICD-10-CM | POA: Diagnosis present

## 2019-06-28 DIAGNOSIS — Z823 Family history of stroke: Secondary | ICD-10-CM

## 2019-06-28 DIAGNOSIS — Z66 Do not resuscitate: Secondary | ICD-10-CM | POA: Diagnosis present

## 2019-06-28 DIAGNOSIS — S72012A Unspecified intracapsular fracture of left femur, initial encounter for closed fracture: Secondary | ICD-10-CM | POA: Diagnosis not present

## 2019-06-28 DIAGNOSIS — G629 Polyneuropathy, unspecified: Secondary | ICD-10-CM | POA: Diagnosis present

## 2019-06-28 DIAGNOSIS — Z79890 Hormone replacement therapy (postmenopausal): Secondary | ICD-10-CM

## 2019-06-28 DIAGNOSIS — K219 Gastro-esophageal reflux disease without esophagitis: Secondary | ICD-10-CM | POA: Diagnosis present

## 2019-06-28 DIAGNOSIS — Z8249 Family history of ischemic heart disease and other diseases of the circulatory system: Secondary | ICD-10-CM

## 2019-06-28 DIAGNOSIS — Z20822 Contact with and (suspected) exposure to covid-19: Secondary | ICD-10-CM | POA: Diagnosis present

## 2019-06-28 DIAGNOSIS — M1990 Unspecified osteoarthritis, unspecified site: Secondary | ICD-10-CM | POA: Diagnosis present

## 2019-06-28 DIAGNOSIS — I4891 Unspecified atrial fibrillation: Secondary | ICD-10-CM | POA: Diagnosis present

## 2019-06-28 DIAGNOSIS — I110 Hypertensive heart disease with heart failure: Secondary | ICD-10-CM | POA: Diagnosis present

## 2019-06-28 DIAGNOSIS — Z96659 Presence of unspecified artificial knee joint: Secondary | ICD-10-CM | POA: Diagnosis present

## 2019-06-28 DIAGNOSIS — I5042 Chronic combined systolic (congestive) and diastolic (congestive) heart failure: Secondary | ICD-10-CM | POA: Diagnosis not present

## 2019-06-28 DIAGNOSIS — Y939 Activity, unspecified: Secondary | ICD-10-CM

## 2019-06-28 DIAGNOSIS — Z9981 Dependence on supplemental oxygen: Secondary | ICD-10-CM

## 2019-06-28 DIAGNOSIS — E039 Hypothyroidism, unspecified: Secondary | ICD-10-CM | POA: Diagnosis present

## 2019-06-28 DIAGNOSIS — Z419 Encounter for procedure for purposes other than remedying health state, unspecified: Secondary | ICD-10-CM

## 2019-06-28 DIAGNOSIS — E538 Deficiency of other specified B group vitamins: Secondary | ICD-10-CM | POA: Diagnosis present

## 2019-06-28 DIAGNOSIS — I2510 Atherosclerotic heart disease of native coronary artery without angina pectoris: Secondary | ICD-10-CM | POA: Diagnosis present

## 2019-06-28 DIAGNOSIS — Z800 Family history of malignant neoplasm of digestive organs: Secondary | ICD-10-CM

## 2019-06-28 DIAGNOSIS — D696 Thrombocytopenia, unspecified: Secondary | ICD-10-CM | POA: Diagnosis present

## 2019-06-28 DIAGNOSIS — D649 Anemia, unspecified: Secondary | ICD-10-CM | POA: Diagnosis present

## 2019-06-28 DIAGNOSIS — Y92481 Parking lot as the place of occurrence of the external cause: Secondary | ICD-10-CM

## 2019-06-28 DIAGNOSIS — Z833 Family history of diabetes mellitus: Secondary | ICD-10-CM

## 2019-06-28 DIAGNOSIS — S72009A Fracture of unspecified part of neck of unspecified femur, initial encounter for closed fracture: Secondary | ICD-10-CM | POA: Diagnosis present

## 2019-06-28 DIAGNOSIS — D509 Iron deficiency anemia, unspecified: Secondary | ICD-10-CM | POA: Diagnosis present

## 2019-06-28 DIAGNOSIS — Z87891 Personal history of nicotine dependence: Secondary | ICD-10-CM

## 2019-06-28 DIAGNOSIS — Z882 Allergy status to sulfonamides status: Secondary | ICD-10-CM

## 2019-06-28 DIAGNOSIS — Z7901 Long term (current) use of anticoagulants: Secondary | ICD-10-CM

## 2019-06-28 DIAGNOSIS — Z880 Allergy status to penicillin: Secondary | ICD-10-CM

## 2019-06-28 DIAGNOSIS — Z79899 Other long term (current) drug therapy: Secondary | ICD-10-CM

## 2019-06-28 DIAGNOSIS — W1830XA Fall on same level, unspecified, initial encounter: Secondary | ICD-10-CM | POA: Diagnosis present

## 2019-06-28 DIAGNOSIS — Z801 Family history of malignant neoplasm of trachea, bronchus and lung: Secondary | ICD-10-CM

## 2019-06-28 DIAGNOSIS — K449 Diaphragmatic hernia without obstruction or gangrene: Secondary | ICD-10-CM | POA: Diagnosis present

## 2019-06-28 LAB — CBC
HCT: 25.1 % — ABNORMAL LOW (ref 36.0–46.0)
HCT: 32.4 % — ABNORMAL LOW (ref 36.0–46.0)
Hemoglobin: 7.9 g/dL — ABNORMAL LOW (ref 12.0–15.0)
Hemoglobin: 10.3 g/dL — ABNORMAL LOW (ref 12.0–15.0)
MCH: 29.9 pg (ref 26.0–34.0)
MCH: 30.1 pg (ref 26.0–34.0)
MCHC: 31.5 g/dL (ref 30.0–36.0)
MCHC: 32.1 g/dL (ref 30.0–36.0)
MCV: 95.1 fL (ref 80.0–100.0)
MCV: 93.6 fL (ref 80.0–100.0)
Platelets: 123 10*3/uL — ABNORMAL LOW (ref 150–400)
Platelets: 138 10*3/uL — ABNORMAL LOW (ref 150–400)
RBC: 2.64 MIL/uL — ABNORMAL LOW (ref 3.87–5.11)
RBC: 3.46 MIL/uL — ABNORMAL LOW (ref 3.87–5.11)
RDW: 14.9 % (ref 11.5–15.5)
RDW: 14.8 % (ref 11.5–15.5)
WBC: 6.1 10*3/uL (ref 4.0–10.5)
WBC: 9.4 10*3/uL (ref 4.0–10.5)
nRBC: 0 % (ref 0.0–0.2)
nRBC: 0 % (ref 0.0–0.2)

## 2019-06-28 LAB — HEPATIC FUNCTION PANEL
ALT: 15 U/L (ref 0–44)
AST: 14 U/L — ABNORMAL LOW (ref 15–41)
Albumin: 3 g/dL — ABNORMAL LOW (ref 3.5–5.0)
Alkaline Phosphatase: 38 U/L (ref 38–126)
Bilirubin, Direct: 0.1 mg/dL (ref 0.0–0.2)
Indirect Bilirubin: 0.3 mg/dL (ref 0.3–0.9)
Total Bilirubin: 0.4 mg/dL (ref 0.3–1.2)
Total Protein: 5.3 g/dL — ABNORMAL LOW (ref 6.5–8.1)

## 2019-06-28 LAB — IRON AND TIBC
Iron: 32 ug/dL (ref 28–170)
Saturation Ratios: 13 % (ref 10.4–31.8)
TIBC: 241 ug/dL — ABNORMAL LOW (ref 250–450)
UIBC: 209 ug/dL

## 2019-06-28 LAB — BASIC METABOLIC PANEL
Anion gap: 5 (ref 5–15)
BUN: 23 mg/dL (ref 8–23)
CO2: 24 mmol/L (ref 22–32)
Calcium: 7.6 mg/dL — ABNORMAL LOW (ref 8.9–10.3)
Chloride: 110 mmol/L (ref 98–111)
Creatinine, Ser: 1.18 mg/dL — ABNORMAL HIGH (ref 0.44–1.00)
GFR calc Af Amer: 47 mL/min — ABNORMAL LOW (ref >60)
GFR calc non Af Amer: 41 mL/min — ABNORMAL LOW (ref >60)
Glucose, Bld: 96 mg/dL (ref 70–99)
Potassium: 3.6 mmol/L (ref 3.5–5.1)
Sodium: 139 mmol/L (ref 135–145)

## 2019-06-28 LAB — PROTIME-INR
INR: 1.3 — ABNORMAL HIGH (ref 0.8–1.2)
Prothrombin Time: 15.2 seconds (ref 11.4–15.2)

## 2019-06-28 LAB — RETICULOCYTES
Immature Retic Fract: 10.8 % (ref 2.3–15.9)
RBC.: 2.63 MIL/uL — ABNORMAL LOW (ref 3.87–5.11)
Retic Count, Absolute: 40.5 10*3/uL (ref 19.0–186.0)
Retic Ct Pct: 1.5 % (ref 0.4–3.1)

## 2019-06-28 LAB — RESPIRATORY PANEL BY RT PCR (FLU A&B, COVID)
Influenza A by PCR: NEGATIVE
Influenza B by PCR: NEGATIVE
SARS Coronavirus 2 by RT PCR: NEGATIVE

## 2019-06-28 LAB — POC OCCULT BLOOD, ED: Fecal Occult Bld: NEGATIVE

## 2019-06-28 LAB — APTT: aPTT: 35 seconds (ref 24–36)

## 2019-06-28 LAB — TYPE AND SCREEN
ABO/RH(D): A POS
Antibody Screen: NEGATIVE

## 2019-06-28 LAB — FOLATE: Folate: 6.7 ng/mL (ref >5.9)

## 2019-06-28 LAB — VITAMIN B12: Vitamin B-12: 1160 pg/mL — ABNORMAL HIGH (ref 180–914)

## 2019-06-28 LAB — TSH: TSH: 4.321 u[IU]/mL (ref 0.350–4.500)

## 2019-06-28 MED ORDER — OXYCODONE-ACETAMINOPHEN 5-325 MG PO TABS: 1.0000 | ORAL_TABLET | ORAL | Status: DC | PRN

## 2019-06-28 MED ORDER — FENTANYL CITRATE (PF) 100 MCG/2ML IJ SOLN
100.0000 ug | Freq: Once | INTRAMUSCULAR | Status: AC
  Administered 2019-06-28: 18:00:00 100 ug via INTRAVENOUS
  Filled 2019-06-28: qty 2

## 2019-06-28 MED ORDER — MORPHINE SULFATE (PF) 2 MG/ML IV SOLN
2.0000 mg | INTRAVENOUS | Status: DC | PRN
  Administered 2019-06-28 – 2019-06-29 (×4): 2 mg via INTRAVENOUS
  Filled 2019-06-28 (×4): qty 1
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
## 2019-06-28 NOTE — Consult Note (Signed)
Reason for Consult: Left hip fracture Referring Physician: EDP-MD  Cathy [name] is an 86 y.o. female.  HPI:   The patient is an 86 year old female who sustained an accidental mechanical fall this afternoon landing on her left hip.  She had no previous history of any hip issues.  She does have multiple medical problems and is also on Eliquis for atrial fibrillation.  She did have Eliquis this morning.  After the inability to ambulate and significant left hip pain she is brought to the [facility] emergency room and found to have a nondisplaced left hip femoral neck fracture.  Orthopedic surgery is consulted for evaluation and treatment of this acute injury.  There is family at the bedside and I have spoken to her daughter-in-law and the patient.  The only complaint is mainly left hip pain.  Past Medical History:  Diagnosis Date  . Allergic rhinitis   . Anemia   . Anxiety   . Chronic systolic CHF (congestive heart failure) ([place])    a. 2D Echo 02/02/15: EF 25-30%, akinesis of mid-apical anteroseptal myocardium, grade 3 DD, mod MR, severely dilated LA, mildly reduced RV systoluc function, mod dilated RA, mod TR, mildly increased PASP 48mmHg. b. 10/2016: EF 55-60% with no regional WMA. Grade 3 DD.   [name] CLOSTRIDIUM DIFFICILE COLITIS 05/26/2009   Qualifier: Diagnosis of  By: Nils Pyle CMA ([place]), Mearl Latin    . Colitis    lymphocytic colitis feb 2011  . Coronary artery calcification    a. By CT 01/2015.  . Diverticulosis of colon   . DJD (degenerative joint disease)    gets epidural injections  . Dysphagia   . Hiatal hernia   . Hypercholesteremia   . Hypertension   . Iron deficiency   . Irritable bowel syndrome   . Liver lesion 02/18/2015  . Lumbar back pain   . Osteoporosis   . Paroxysmal atrial flutter ([place])    a. Dx 01/2015 - underwent DCCV 03/2015; takes Eliquis  . Sinus bradycardia   . Splenic lesion 02/18/2015  . Urinary incontinence   . Venous insufficiency   . Vitamin B12 deficiency      Past Surgical History:  Procedure Laterality Date  . ABDOMINAL HYSTERECTOMY    . CARDIOVERSION N/A 02/03/2015   Procedure: CARDIOVERSION;  Surgeon: Sueanne Margarita, MD;  Location: [facility] ENDOSCOPY;  Service: Cardiovascular;  Laterality: N/A;  . CARDIOVERSION N/A 03/10/2015   Procedure: CARDIOVERSION;  Surgeon: Dorothy Spark, MD;  Location: [place];  Service: Cardiovascular;  Laterality: N/A;  . CARDIOVERSION N/A 01/07/2017   Procedure: CARDIOVERSION;  Surgeon: Pixie Casino, MD;  Location: [facility] ENDOSCOPY;  Service: Cardiovascular;  Laterality: N/A;  . cataract surgery    . decompressive lumbar laminectomy  06/2008   at L2-3, L3-4 and L4-5 by Dr. Ronnald Ramp  . LAPAROSCOPIC CHOLECYSTECTOMY  04/2009   Dr. Abran Cantor  . REPLACEMENT TOTAL KNEE    . TEE WITHOUT CARDIOVERSION N/A 02/03/2015   Procedure: TRANSESOPHAGEAL ECHOCARDIOGRAM (TEE);  Surgeon: Sueanne Margarita, MD;  Location: [facility] ENDOSCOPY;  Service: Cardiovascular;  Laterality: N/A;    Family History  Problem Relation Age of Onset  . Cancer Brother        lung  . Diabetes Son   . Coronary artery disease Son   . Kidney disease Son   . Stroke Son   . Heart attack Mother   . Cancer Brother        colon  . Cancer Daughter  cervical, lung   . Heart attack Brother   . Diabetes Brother   . Heart attack Sister   . Hypertension Neg Hx   . Fainting Neg Hx     Social History:  reports that she has quit smoking. Her smoking use included cigarettes. She has a 5.00 pack-year smoking history. She has never used smokeless tobacco. She reports that she does not drink alcohol or use drugs.  Allergies:  Allergies  Allergen Reactions  . Amoxicillin-Pot Clavulanate Diarrhea    Has patient had a PCN reaction causing immediate rash, facial/tongue/throat swelling, SOB or lightheadedness with hypotension: no Has patient had a PCN reaction causing severe rash involving mucus membranes or skin necrosis:  no Has patient had a PCN reaction that  required hospitalization: no Has patient had a PCN reaction occurring within the last 10 years no If all of the above answers are "NO", then may proceed with Cephalosporin use.   . Sulfonamide Derivatives Other (See Comments)    unknown    Medications: I have reviewed the patient's current medications.  Results for orders placed or performed during the hospital encounter of 06/28/19 (from the past 48 hour(s))  Basic metabolic panel     Status: Abnormal   Collection Time: 06/28/19  5:01 PM  Result Value Ref Range   Sodium 139 135 - 145 mmol/L   Potassium 3.6 3.5 - 5.1 mmol/L   Chloride 110 98 - 111 mmol/L   CO2 24 22 - 32 mmol/L   Glucose, Bld 96 70 - 99 mg/dL    Comment: Glucose reference range applies only to samples taken after fasting for at least 8 hours.   BUN 23 8 - 23 mg/dL   Creatinine, Ser 1.18 (H) 0.44 - 1.00 mg/dL   Calcium 7.6 (L) 8.9 - 10.3 mg/dL   GFR calc non Af Amer 41 (L) >60 mL/min   GFR calc Af Amer 47 (L) >60 mL/min   Anion gap 5 5 - 15    Comment: Performed at [facility], [place] [address], [place], [place] [address]  CBC     Status: Abnormal   Collection Time: 06/28/19  5:01 PM  Result Value Ref Range   WBC 6.1 4.0 - 10.5 K/uL   RBC 2.64 (L) 3.87 - 5.11 MIL/uL   Hemoglobin 7.9 (L) 12.0 - 15.0 g/dL   HCT 25.1 (L) 36.0 - 46.0 %   MCV 95.1 80.0 - 100.0 fL   MCH 29.9 26.0 - 34.0 pg   MCHC 31.5 30.0 - 36.0 g/dL   RDW 14.9 11.5 - 15.5 %   Platelets 123 (L) 150 - 400 K/uL    Comment: Immature Platelet Fraction may be clinically indicated, consider ordering this additional test [id number]    nRBC 0.0 0.0 - 0.2 %    Comment: Performed at [facility], [place] [address], [place], [place] [address]  Type and screen [place]     Status: None   Collection Time: 06/28/19  5:01 PM  Result Value Ref Range   ABO/RH(D) A POS    Antibody Screen NEG    Sample Expiration      07/01/2019,2359 Performed at  [facility], [place] [address], [place], [place] [address]   Reticulocytes     Status: Abnormal   Collection Time: 06/28/19  5:01 PM  Result Value Ref Range   Retic Ct Pct 1.5 0.4 - 3.1 %   RBC. 2.63 (L) 3.87 - 5.11  MIL/uL   Retic Count, Absolute 40.5 19.0 - 186.0 K/uL   Immature Retic Fract 10.8 2.3 - 15.9 %    Comment: Performed at [facility], [place] [address], [place], [place] [address]  Respiratory Panel by RT PCR (Flu A&B, Covid) - Nasopharyngeal Swab     Status: None   Collection Time: 06/28/19  6:10 PM   Specimen: Nasopharyngeal Swab  Result Value Ref Range   SARS Coronavirus 2 by RT PCR NEGATIVE NEGATIVE    Comment: (NOTE) SARS-CoV-2 target nucleic acids are NOT DETECTED. The SARS-CoV-2 RNA is generally detectable in upper respiratoy specimens during the acute phase of infection. The lowest concentration of SARS-CoV-2 viral copies this assay can detect is 131 copies/mL. A negative result does not preclude SARS-Cov-2 infection and should not be used as the sole basis for treatment or other patient management decisions. A negative result may occur with  improper specimen collection/handling, submission of specimen other than nasopharyngeal swab, presence of viral mutation(s) within the areas targeted by this assay, and inadequate number of viral copies (<131 copies/mL). A negative result must be combined with clinical observations, patient history, and epidemiological information. The expected result is Negative. Fact Sheet for Patients:  PinkCheek.be Fact Sheet for Healthcare Providers:  GravelBags.it This test is not yet ap proved or cleared by the [place] FDA and  has been authorized for detection and/or diagnosis of SARS-CoV-2 by FDA under an Emergency Use Authorization (EUA). This EUA will remain  in effect (meaning this test can be used) for the duration of the COVID-19  declaration under Section 564(b)(1) of the Act, 21 U.S.C. section 360bbb-3(b)(1), unless the authorization is terminated or revoked sooner.    Influenza A by PCR NEGATIVE NEGATIVE   Influenza B by PCR NEGATIVE NEGATIVE    Comment: (NOTE) The Xpert Xpress SARS-CoV-2/FLU/RSV assay is intended as an aid in  the diagnosis of influenza from Nasopharyngeal swab specimens and  should not be used as a sole basis for treatment. Nasal washings and  aspirates are unacceptable for Xpert Xpress SARS-CoV-2/FLU/RSV  testing. Fact Sheet for Patients: PinkCheek.be Fact Sheet for Healthcare Providers: GravelBags.it This test is not yet approved or cleared by the [place] FDA and  has been authorized for detection and/or diagnosis of SARS-CoV-2 by  FDA under an Emergency Use Authorization (EUA). This EUA will remain  in effect (meaning this test can be used) for the duration of the  Covid-19 declaration under Section 564(b)(1) of the Act, 21  U.S.C. section 360bbb-3(b)(1), unless the authorization is  terminated or revoked. Performed at [facility], [place] [address] [place], [place] [address]   POC occult blood, ED     Status: None   Collection Time: 06/28/19  7:38 PM  Result Value Ref Range   Fecal Occult Bld NEGATIVE NEGATIVE    DG Chest 2 View  Result Date: 06/28/2019 CLINICAL DATA:  Recent fall with known hip pain, initial encounter EXAM: CHEST - 2 VIEW COMPARISON:  02/21/2018 FINDINGS: Cardiac shadow is enlarged but stable. Aortic calcifications are again noted. The lungs are well aerated bilaterally. Large hiatal hernia is again seen. No focal infiltrate is noted. Degenerative changes of the thoracic spine and shoulder joints are seen. No acute bony abnormality is noted. IMPRESSION: Large hiatal hernia stable from the prior study. No acute abnormality noted. Electronically Signed   By: Inez Catalina M.D.   On:  06/28/2019 17:52   CT Hip Left Wo Contrast  Result Date:  06/28/2019 CLINICAL DATA:  Left hip pain. Abnormal x-ray EXAM: CT OF THE LEFT HIP WITHOUT CONTRAST TECHNIQUE: Multidetector CT imaging of the left hip was performed according to the standard protocol. Multiplanar CT image reconstructions were also generated. COMPARISON:  X-ray 06/28/2019 FINDINGS: Bones/Joint/Cartilage Acute nondisplaced left femoral neck fracture which is predominantly subcapital (series 7, images 36-38; series 6, images 25-29). No fracture extension into the intertrochanteric region or femoral head articular surface. Mild left hip joint space narrowing. No dislocation. Visualized left hemipelvis is intact without fracture. Mild degenerative changes of the left SI joint. No SI joint or pubic symphysis diastasis. Ligaments Suboptimally assessed by CT. Muscles and Tendons Grossly intact within the limitations of CT. Soft tissues No focal soft tissue fluid collection or hematoma. Atherosclerotic calcifications are noted. IMPRESSION: Acute nondisplaced left femoral neck fracture which is predominantly subcapital. Electronically Signed   By: Davina Poke D.O.   On: 06/28/2019 19:00   DG Hip Unilat With Pelvis 2-3 Views Left  Result Date: 06/28/2019 CLINICAL DATA:  Recent fall with left hip pain, initial encounter EXAM: DG HIP (WITH OR WITHOUT PELVIS) 2-3V LEFT COMPARISON:  None. FINDINGS: Pelvic ring is intact. Degenerative changes of lumbar spine are noted. Mild lucency is noted at the junction of the femoral head and femoral neck suspicious for mildly impacted fracture. CT is recommended for further evaluation. IMPRESSION: Findings suspicious for subcapital femoral neck fracture. CT is recommended for further evaluation. Electronically Signed   By: Inez Catalina M.D.   On: 06/28/2019 17:51    Review of Systems Blood pressure (!) 142/73, pulse (!) 56, temperature 98.4 F (36.9 C), temperature source Oral, resp. rate 16, SpO2 99  %. Physical Exam  Constitutional: She appears well-developed and well-nourished.  HENT:  Head: Normocephalic and atraumatic.  Eyes: Pupils are equal, round, and reactive to light.  Respiratory: Effort normal.  GI: Soft.  Musculoskeletal:     Cervical back: Normal range of motion.     Left hip: Tenderness and bony tenderness present. Decreased range of motion. Decreased strength.  Neurological: She is alert.  Skin: Skin is warm and dry.  Psychiatric: She has a normal mood and affect.    Assessment/Plan: Left hip nondisplaced femoral neck fracture  I have talked to the family and the patient in length.  The fracture does need stabilization with cannulated screws.  I explained what the surgery involves and even showed examples.  Given the fact that the patient did take Eliquis today, we do need to delay surgery until at least Saturday so that there is the potential for spinal anesthesia given her advanced age and comorbidities.  I will certainly communicate tomorrow with anesthesia to find out their preference as to timing for spinal anesthesia once Eliquis has been stopped.  I communicated this as well with the primary admitting service and spoke in person with Dr. Cyd Silence.  She can eat from my perspective tonight and have a diet tomorrow since surgery will be likely over the weekend.  I did talk about nonoperative and operative surgical recommendations.  The patient is having significant pain with any attempts to move her hip so certainly for quality of life purposes and mobility purposes this is certainly a situation where you would consider surgery.  I did also talk about the options of cannulated screws versus a partial hip replacement and the risk and benefits of both of those treatment options.  Mcarthur Rossetti 06/28/2019, 9:03 PM

## 2019-06-29 DIAGNOSIS — Y92481 Parking lot as the place of occurrence of the external cause: Secondary | ICD-10-CM | POA: Diagnosis not present

## 2019-06-29 DIAGNOSIS — Z66 Do not resuscitate: Secondary | ICD-10-CM | POA: Diagnosis present

## 2019-06-29 DIAGNOSIS — S72002D Fracture of unspecified part of neck of left femur, subsequent encounter for closed fracture with routine healing: Secondary | ICD-10-CM | POA: Diagnosis not present

## 2019-06-29 DIAGNOSIS — I2510 Atherosclerotic heart disease of native coronary artery without angina pectoris: Secondary | ICD-10-CM | POA: Diagnosis present

## 2019-06-29 DIAGNOSIS — M1990 Unspecified osteoarthritis, unspecified site: Secondary | ICD-10-CM | POA: Diagnosis present

## 2019-06-29 DIAGNOSIS — I4891 Unspecified atrial fibrillation: Secondary | ICD-10-CM | POA: Diagnosis present

## 2019-06-29 DIAGNOSIS — Z20822 Contact with and (suspected) exposure to covid-19: Secondary | ICD-10-CM | POA: Diagnosis present

## 2019-06-29 DIAGNOSIS — W1830XA Fall on same level, unspecified, initial encounter: Secondary | ICD-10-CM | POA: Diagnosis present

## 2019-06-29 DIAGNOSIS — Z801 Family history of malignant neoplasm of trachea, bronchus and lung: Secondary | ICD-10-CM | POA: Diagnosis not present

## 2019-06-29 DIAGNOSIS — K219 Gastro-esophageal reflux disease without esophagitis: Secondary | ICD-10-CM

## 2019-06-29 DIAGNOSIS — Z800 Family history of malignant neoplasm of digestive organs: Secondary | ICD-10-CM | POA: Diagnosis not present

## 2019-06-29 DIAGNOSIS — Y939 Activity, unspecified: Secondary | ICD-10-CM | POA: Diagnosis not present

## 2019-06-29 DIAGNOSIS — I110 Hypertensive heart disease with heart failure: Secondary | ICD-10-CM | POA: Diagnosis present

## 2019-06-29 DIAGNOSIS — S79912A Unspecified injury of left hip, initial encounter: Secondary | ICD-10-CM | POA: Diagnosis present

## 2019-06-29 DIAGNOSIS — E7800 Pure hypercholesterolemia, unspecified: Secondary | ICD-10-CM | POA: Diagnosis present

## 2019-06-29 DIAGNOSIS — G629 Polyneuropathy, unspecified: Secondary | ICD-10-CM | POA: Diagnosis present

## 2019-06-29 DIAGNOSIS — D509 Iron deficiency anemia, unspecified: Secondary | ICD-10-CM | POA: Diagnosis present

## 2019-06-29 DIAGNOSIS — S72012A Unspecified intracapsular fracture of left femur, initial encounter for closed fracture: Secondary | ICD-10-CM | POA: Diagnosis present

## 2019-06-29 DIAGNOSIS — E039 Hypothyroidism, unspecified: Secondary | ICD-10-CM | POA: Diagnosis present

## 2019-06-29 DIAGNOSIS — M810 Age-related osteoporosis without current pathological fracture: Secondary | ICD-10-CM | POA: Diagnosis present

## 2019-06-29 DIAGNOSIS — S72002A Fracture of unspecified part of neck of left femur, initial encounter for closed fracture: Secondary | ICD-10-CM | POA: Diagnosis not present

## 2019-06-29 DIAGNOSIS — Z9981 Dependence on supplemental oxygen: Secondary | ICD-10-CM | POA: Diagnosis not present

## 2019-06-29 DIAGNOSIS — S72009A Fracture of unspecified part of neck of unspecified femur, initial encounter for closed fracture: Secondary | ICD-10-CM | POA: Diagnosis present

## 2019-06-29 DIAGNOSIS — Z96659 Presence of unspecified artificial knee joint: Secondary | ICD-10-CM | POA: Diagnosis present

## 2019-06-29 DIAGNOSIS — K449 Diaphragmatic hernia without obstruction or gangrene: Secondary | ICD-10-CM | POA: Diagnosis present

## 2019-06-29 DIAGNOSIS — E785 Hyperlipidemia, unspecified: Secondary | ICD-10-CM | POA: Diagnosis present

## 2019-06-29 DIAGNOSIS — I4892 Unspecified atrial flutter: Secondary | ICD-10-CM | POA: Diagnosis present

## 2019-06-29 DIAGNOSIS — I5042 Chronic combined systolic (congestive) and diastolic (congestive) heart failure: Secondary | ICD-10-CM | POA: Diagnosis present

## 2019-06-29 DIAGNOSIS — E538 Deficiency of other specified B group vitamins: Secondary | ICD-10-CM | POA: Diagnosis present

## 2019-06-29 DIAGNOSIS — N289 Disorder of kidney and ureter, unspecified: Secondary | ICD-10-CM | POA: Diagnosis not present

## 2019-06-29 DIAGNOSIS — D696 Thrombocytopenia, unspecified: Secondary | ICD-10-CM | POA: Diagnosis present

## 2019-06-29 LAB — CBC WITH DIFFERENTIAL/PLATELET
Abs Immature Granulocytes: 0.02 10*3/uL (ref 0.00–0.07)
Basophils Absolute: 0 10*3/uL (ref 0.0–0.1)
Basophils Relative: 0 %
Eosinophils Absolute: 0 10*3/uL (ref 0.0–0.5)
Eosinophils Relative: 1 %
HCT: 34 % — ABNORMAL LOW (ref 36.0–46.0)
Hemoglobin: 11 g/dL — ABNORMAL LOW (ref 12.0–15.0)
Immature Granulocytes: 0 %
Lymphocytes Relative: 8 %
Lymphs Abs: 0.6 10*3/uL — ABNORMAL LOW (ref 0.7–4.0)
MCH: 30.4 pg (ref 26.0–34.0)
MCHC: 32.4 g/dL (ref 30.0–36.0)
MCV: 93.9 fL (ref 80.0–100.0)
Monocytes Absolute: 0.8 10*3/uL (ref 0.1–1.0)
Monocytes Relative: 12 %
Neutro Abs: 5.1 10*3/uL (ref 1.7–7.7)
Neutrophils Relative %: 79 %
Platelets: 128 10*3/uL — ABNORMAL LOW (ref 150–400)
RBC: 3.62 MIL/uL — ABNORMAL LOW (ref 3.87–5.11)
RDW: 14.8 % (ref 11.5–15.5)
WBC: 6.5 10*3/uL (ref 4.0–10.5)
nRBC: 0 % (ref 0.0–0.2)

## 2019-06-29 LAB — MAGNESIUM: Magnesium: 2.2 mg/dL (ref 1.7–2.4)

## 2019-06-29 LAB — FERRITIN: Ferritin: 318 ng/mL — ABNORMAL HIGH (ref 11–307)

## 2019-06-29 LAB — VITAMIN D 25 HYDROXY (VIT D DEFICIENCY, FRACTURES): Vit D, 25-Hydroxy: 40.65 ng/mL (ref 30–100)

## 2019-06-29 MED ORDER — ACETAMINOPHEN 325 MG PO TABS
650.0000 mg | ORAL_TABLET | Freq: Three times a day (TID) | ORAL | Status: DC
  Administered 2019-06-29 – 2019-07-03 (×12): 650 mg via ORAL
  Filled 2019-06-29 (×13): qty 2

## 2019-06-29 MED ORDER — LEVOTHYROXINE SODIUM 125 MCG PO TABS
62.5000 ug | ORAL_TABLET | Freq: Every day | ORAL | Status: DC
  Administered 2019-06-29 – 2019-07-03 (×4): 62.5 ug via ORAL
  Filled 2019-06-29 (×4): qty 1

## 2019-06-29 MED ORDER — POLYETHYLENE GLYCOL 3350 17 G PO PACK
17.0000 g | PACK | Freq: Every day | ORAL | Status: DC
  Administered 2019-06-29 – 2019-07-02 (×4): 17 g via ORAL
  Filled 2019-06-29 (×4): qty 1

## 2019-06-29 MED ORDER — ATORVASTATIN CALCIUM 10 MG PO TABS
20.0000 mg | ORAL_TABLET | Freq: Every day | ORAL | Status: DC
  Administered 2019-06-29 – 2019-07-03 (×5): 20 mg via ORAL
  Filled 2019-06-29 (×5): qty 2

## 2019-06-29 MED ORDER — POLYETHYLENE GLYCOL 3350 17 G PO PACK: 17.0000 g | PACK | Freq: Every day | ORAL | Status: DC | PRN

## 2019-06-29 MED ORDER — ACETAMINOPHEN 650 MG RE SUPP: 650.0000 mg | Freq: Four times a day (QID) | RECTAL | Status: DC | PRN

## 2019-06-29 MED ORDER — VITAMIN D 25 MCG (1000 UNIT) PO TABS
1000.0000 [IU] | ORAL_TABLET | Freq: Every day | ORAL | Status: DC
  Administered 2019-06-29 – 2019-07-03 (×5): 1000 [IU] via ORAL
  Filled 2019-06-29 (×5): qty 1

## 2019-06-29 MED ORDER — SENNOSIDES-DOCUSATE SODIUM 8.6-50 MG PO TABS
2.0000 | ORAL_TABLET | Freq: Two times a day (BID) | ORAL | Status: DC
  Administered 2019-06-29 – 2019-07-02 (×6): 2 via ORAL
  Filled 2019-06-29 (×7): qty 2

## 2019-06-29 MED ORDER — DICLOFENAC SODIUM 1 % EX GEL
2.0000 g | Freq: Four times a day (QID) | CUTANEOUS | Status: DC | PRN
  Administered 2019-06-29 – 2019-07-02 (×3): 2 g via TOPICAL
  Filled 2019-06-29: qty 100

## 2019-06-29 MED ORDER — LIDOCAINE 5 % EX PTCH
1.0000 | MEDICATED_PATCH | CUTANEOUS | Status: DC
  Administered 2019-06-29 – 2019-07-02 (×4): 1 via TRANSDERMAL
  Filled 2019-06-29 (×5): qty 1

## 2019-06-29 MED ORDER — PROCHLORPERAZINE EDISYLATE 10 MG/2ML IJ SOLN: 10.0000 mg | Freq: Four times a day (QID) | INTRAMUSCULAR | Status: DC | PRN

## 2019-06-29 MED ORDER — SODIUM CHLORIDE 0.9 % IV SOLN: INTRAVENOUS | Status: AC

## 2019-06-29 MED ORDER — ISOSORBIDE MONONITRATE ER 60 MG PO TB24
60.0000 mg | ORAL_TABLET | Freq: Every day | ORAL | Status: DC
  Administered 2019-06-29 – 2019-07-03 (×5): 60 mg via ORAL
  Filled 2019-06-29 (×6): qty 1

## 2019-06-29 MED ORDER — VITAMIN B-12 1000 MCG PO TABS
1000.0000 ug | ORAL_TABLET | Freq: Every day | ORAL | Status: DC
  Administered 2019-06-29 – 2019-07-03 (×5): 1000 ug via ORAL
  Filled 2019-06-29 (×6): qty 1

## 2019-06-29 MED ORDER — PANTOPRAZOLE SODIUM 40 MG PO TBEC
40.0000 mg | DELAYED_RELEASE_TABLET | Freq: Every day | ORAL | Status: DC
  Administered 2019-06-29 – 2019-07-03 (×5): 40 mg via ORAL
  Filled 2019-06-29 (×6): qty 1

## 2019-06-29 MED ORDER — ACETAMINOPHEN 325 MG PO TABS: 650.0000 mg | ORAL_TABLET | Freq: Four times a day (QID) | ORAL | Status: DC | PRN

## 2019-06-29 MED ORDER — SENNOSIDES-DOCUSATE SODIUM 8.6-50 MG PO TABS: 1.0000 | ORAL_TABLET | Freq: Every evening | ORAL | Status: DC | PRN

## 2019-06-29 MED ORDER — DARIFENACIN HYDROBROMIDE ER 15 MG PO TB24
15.0000 mg | ORAL_TABLET | Freq: Every day | ORAL | Status: DC
  Administered 2019-06-29 – 2019-07-03 (×4): 15 mg via ORAL
  Filled 2019-06-29 (×5): qty 1

## 2019-06-29 MED ORDER — AMIODARONE HCL 200 MG PO TABS
200.0000 mg | ORAL_TABLET | Freq: Every day | ORAL | Status: DC
  Administered 2019-06-29 – 2019-07-03 (×4): 200 mg via ORAL
  Filled 2019-06-29 (×5): qty 1

## 2019-06-29 NOTE — Progress Notes (Signed)
Patient ID: Cathy [name], female   DOB: 11-Dec-1927, 87 y.o.   MRN: [id number] I have spoken to the patient and family at the bedside today.  She does have a nondisplaced left hip femoral neck fracture.  Our plan is to proceed to surgery tomorrow for cannulated screw fixation of her left hip fracture.  We will have her n.p.o. after midnight in anticipation of surgery sometime tomorrow.  All questions and concerns were answered and addressed.

## 2019-06-29 NOTE — ED Notes (Signed)
Pt transported by Maudie Mercury, Admission RN.
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
## 2019-06-30 ENCOUNTER — Encounter (HOSPITAL_COMMUNITY): Payer: Self-pay | Admitting: Internal Medicine

## 2019-06-30 ENCOUNTER — Inpatient Hospital Stay (HOSPITAL_COMMUNITY): Payer: Medicare Other | Admitting: Anesthesiology

## 2019-06-30 ENCOUNTER — Inpatient Hospital Stay (HOSPITAL_COMMUNITY): Payer: Medicare Other

## 2019-06-30 ENCOUNTER — Encounter (HOSPITAL_COMMUNITY)
Admission: EM | Disposition: A | Discharge status: Skilled Nursing Facility | Payer: Self-pay | Source: Home / Self Care | Attending: Internal Medicine

## 2019-06-30 DIAGNOSIS — S72002A Fracture of unspecified part of neck of left femur, initial encounter for closed fracture: Secondary | ICD-10-CM | POA: Diagnosis not present

## 2019-06-30 HISTORY — PX: HIP PINNING,CANNULATED: SHX1758

## 2019-06-30 LAB — CBC
HCT: 32.8 % — ABNORMAL LOW (ref 36.0–46.0)
Hemoglobin: 10.3 g/dL — ABNORMAL LOW (ref 12.0–15.0)
MCH: 29.7 pg (ref 26.0–34.0)
MCHC: 31.4 g/dL (ref 30.0–36.0)
MCV: 94.5 fL (ref 80.0–100.0)
Platelets: 105 10*3/uL — ABNORMAL LOW (ref 150–400)
RBC: 3.47 MIL/uL — ABNORMAL LOW (ref 3.87–5.11)
RDW: 15 % (ref 11.5–15.5)
WBC: 7.1 10*3/uL (ref 4.0–10.5)
nRBC: 0 % (ref 0.0–0.2)

## 2019-06-30 LAB — BASIC METABOLIC PANEL
Anion gap: 7 (ref 5–15)
BUN: 23 mg/dL (ref 8–23)
CO2: 26 mmol/L (ref 22–32)
Calcium: 8.4 mg/dL — ABNORMAL LOW (ref 8.9–10.3)
Chloride: 105 mmol/L (ref 98–111)
Creatinine, Ser: 0.94 mg/dL (ref 0.44–1.00)
GFR calc Af Amer: >60 mL/min (ref >60)
GFR calc non Af Amer: 54 mL/min — ABNORMAL LOW (ref >60)
Glucose, Bld: 74 mg/dL (ref 70–99)
Potassium: 3.8 mmol/L (ref 3.5–5.1)
Sodium: 138 mmol/L (ref 135–145)

## 2019-06-30 LAB — SURGICAL PCR SCREEN
MRSA, PCR: NEGATIVE
Staphylococcus aureus: NEGATIVE

## 2019-06-30 SURGERY — FIXATION, FEMUR, NECK, PERCUTANEOUS, USING SCREW
Anesthesia: General | Site: Hip | Laterality: Left

## 2019-06-30 MED ORDER — SUGAMMADEX SODIUM 200 MG/2ML IV SOLN
INTRAVENOUS | Status: DC | PRN
  Administered 2019-06-30: 200 mg via INTRAVENOUS

## 2019-06-30 MED ORDER — FENTANYL CITRATE (PF) 100 MCG/2ML IJ SOLN
INTRAMUSCULAR | Status: AC
  Filled 2019-06-30: qty 2

## 2019-06-30 MED ORDER — LIDOCAINE 2% (20 MG/ML) 5 ML SYRINGE
INTRAMUSCULAR | Status: DC | PRN
  Administered 2019-06-30: 50 mg via INTRAVENOUS

## 2019-06-30 MED ORDER — CLINDAMYCIN PHOSPHATE 900 MG/50ML IV SOLN
INTRAVENOUS | Status: AC
  Filled 2019-06-30: qty 50

## 2019-06-30 MED ORDER — CHLORHEXIDINE GLUCONATE CLOTH 2 % EX PADS
6.0000 | MEDICATED_PAD | Freq: Every day | CUTANEOUS | Status: DC
  Administered 2019-06-30 – 2019-07-03 (×4): 6 via TOPICAL

## 2019-06-30 MED ORDER — PHENOL 1.4 % MT LIQD
1.0000 | OROMUCOSAL | Status: DC | PRN
  Filled 2019-06-30: qty 177

## 2019-06-30 MED ORDER — FENTANYL CITRATE (PF) 100 MCG/2ML IJ SOLN
INTRAMUSCULAR | Status: DC | PRN
  Administered 2019-06-30: 50 ug via INTRAVENOUS

## 2019-06-30 MED ORDER — CLINDAMYCIN PHOSPHATE 600 MG/50ML IV SOLN
600.0000 mg | Freq: Four times a day (QID) | INTRAVENOUS | Status: AC
  Administered 2019-06-30 (×2): 600 mg via INTRAVENOUS
  Filled 2019-06-30 (×2): qty 50

## 2019-06-30 MED ORDER — METOCLOPRAMIDE HCL 5 MG/ML IJ SOLN: 5.0000 mg | Freq: Three times a day (TID) | INTRAMUSCULAR | Status: DC | PRN

## 2019-06-30 MED ORDER — OXYCODONE HCL 5 MG/5ML PO SOLN: 5.0000 mg | Freq: Once | ORAL | Status: DC | PRN

## 2019-06-30 MED ORDER — CLINDAMYCIN PHOSPHATE 900 MG/50ML IV SOLN
INTRAVENOUS | Status: DC | PRN
  Administered 2019-06-30: 900 mg via INTRAVENOUS

## 2019-06-30 MED ORDER — ACETAMINOPHEN 500 MG PO TABS: 1000.0000 mg | ORAL_TABLET | Freq: Once | ORAL | Status: DC | PRN

## 2019-06-30 MED ORDER — APIXABAN 2.5 MG PO TABS
2.5000 mg | ORAL_TABLET | Freq: Two times a day (BID) | ORAL | Status: DC
  Administered 2019-07-01 – 2019-07-03 (×5): 2.5 mg via ORAL
  Filled 2019-06-30 (×6): qty 1

## 2019-06-30 MED ORDER — ROCURONIUM BROMIDE 10 MG/ML (PF) SYRINGE
PREFILLED_SYRINGE | INTRAVENOUS | Status: DC | PRN
  Administered 2019-06-30: 40 mg via INTRAVENOUS

## 2019-06-30 MED ORDER — PROPOFOL 10 MG/ML IV BOLUS
INTRAVENOUS | Status: DC | PRN
  Administered 2019-06-30: 50 mg via INTRAVENOUS

## 2019-06-30 MED ORDER — ACETAMINOPHEN 10 MG/ML IV SOLN: 1000.0000 mg | Freq: Once | INTRAVENOUS | Status: DC | PRN

## 2019-06-30 MED ORDER — MUPIROCIN 2 % EX OINT
1.0000 "application " | TOPICAL_OINTMENT | Freq: Two times a day (BID) | CUTANEOUS | Status: DC
  Administered 2019-06-30 – 2019-07-03 (×5): 1 via NASAL
  Filled 2019-06-30: qty 22

## 2019-06-30 MED ORDER — METOCLOPRAMIDE HCL 5 MG PO TABS: 5.0000 mg | ORAL_TABLET | Freq: Three times a day (TID) | ORAL | Status: DC | PRN

## 2019-06-30 MED ORDER — HYDROCODONE-ACETAMINOPHEN 5-325 MG PO TABS
1.0000 | ORAL_TABLET | ORAL | Status: DC | PRN
  Administered 2019-06-30 – 2019-07-01 (×2): 1 via ORAL
  Filled 2019-06-30 (×2): qty 1

## 2019-06-30 MED ORDER — MORPHINE SULFATE (PF) 2 MG/ML IV SOLN: 0.5000 mg | INTRAVENOUS | Status: DC | PRN

## 2019-06-30 MED ORDER — PHENYLEPHRINE 40 MCG/ML (10ML) SYRINGE FOR IV PUSH (FOR BLOOD PRESSURE SUPPORT)
PREFILLED_SYRINGE | INTRAVENOUS | Status: DC | PRN
  Administered 2019-06-30 (×4): 80 ug via INTRAVENOUS

## 2019-06-30 MED ORDER — ACETAMINOPHEN 325 MG PO TABS: 325.0000 mg | ORAL_TABLET | Freq: Four times a day (QID) | ORAL | Status: DC | PRN

## 2019-06-30 MED ORDER — 0.9 % SODIUM CHLORIDE (POUR BTL) OPTIME
TOPICAL | Status: DC | PRN
  Administered 2019-06-30: 1000 mL

## 2019-06-30 MED ORDER — ONDANSETRON HCL 4 MG PO TABS: 4.0000 mg | ORAL_TABLET | Freq: Four times a day (QID) | ORAL | Status: DC | PRN

## 2019-06-30 MED ORDER — ACETAMINOPHEN 160 MG/5ML PO SOLN: 1000.0000 mg | Freq: Once | ORAL | Status: DC | PRN

## 2019-06-30 MED ORDER — HYDROCODONE-ACETAMINOPHEN 7.5-325 MG PO TABS
1.0000 | ORAL_TABLET | ORAL | Status: DC | PRN
  Administered 2019-06-30: 1 via ORAL
  Filled 2019-06-30: qty 2

## 2019-06-30 MED ORDER — ONDANSETRON HCL 4 MG/2ML IJ SOLN: 4.0000 mg | Freq: Four times a day (QID) | INTRAMUSCULAR | Status: DC | PRN

## 2019-06-30 MED ORDER — OXYCODONE HCL 5 MG PO TABS: 5.0000 mg | ORAL_TABLET | Freq: Once | ORAL | Status: DC | PRN

## 2019-06-30 MED ORDER — ONDANSETRON HCL 4 MG/2ML IJ SOLN
INTRAMUSCULAR | Status: DC | PRN
  Administered 2019-06-30: 4 mg via INTRAVENOUS

## 2019-06-30 MED ORDER — LACTATED RINGERS IV SOLN: INTRAVENOUS | Status: DC | PRN

## 2019-06-30 MED ORDER — MENTHOL 3 MG MT LOZG: 1.0000 | LOZENGE | OROMUCOSAL | Status: DC | PRN

## 2019-06-30 MED ORDER — FENTANYL CITRATE (PF) 100 MCG/2ML IJ SOLN: 25.0000 ug | INTRAMUSCULAR | Status: DC | PRN

## 2019-06-30 MED ORDER — DEXAMETHASONE SODIUM PHOSPHATE 10 MG/ML IJ SOLN
INTRAMUSCULAR | Status: DC | PRN
  Administered 2019-06-30: 4 mg via INTRAVENOUS

## 2019-06-30 MED ORDER — DOCUSATE SODIUM 100 MG PO CAPS
100.0000 mg | ORAL_CAPSULE | Freq: Two times a day (BID) | ORAL | Status: DC
  Administered 2019-06-30 – 2019-07-02 (×6): 100 mg via ORAL
  Filled 2019-06-30 (×6): qty 1

## 2019-06-30 SURGICAL SUPPLY — 41 items
BAG SPEC THK2 15X12 ZIP CLS (MISCELLANEOUS) ×1
BAG ZIPLOCK 12X15 (MISCELLANEOUS) ×2 IMPLANT
BIT DRILL 4.8X200 CANN (BIT) ×1 IMPLANT
BNDG GAUZE ELAST 4 BULKY (GAUZE/BANDAGES/DRESSINGS) ×2 IMPLANT
COVER SURGICAL LIGHT HANDLE (MISCELLANEOUS) ×2 IMPLANT
DRAPE STERI IOBAN 125X83 (DRAPES) ×2 IMPLANT
DRESSING AQUACEL AG SP 3.5X4 (GAUZE/BANDAGES/DRESSINGS) IMPLANT
DRSG AQUACEL AG SP 3.5X4 (GAUZE/BANDAGES/DRESSINGS) ×2
DURAPREP 26ML APPLICATOR (WOUND CARE) ×2 IMPLANT
ELECT REM PT RETURN 15FT ADLT (MISCELLANEOUS) ×2 IMPLANT
FACESHIELD WRAPAROUND (MASK) ×4 IMPLANT
FACESHIELD WRAPAROUND OR TEAM (MASK) IMPLANT
GAUZE XEROFORM 1X8 LF (GAUZE/BANDAGES/DRESSINGS) ×1 IMPLANT
GAUZE XEROFORM 5X9 LF (GAUZE/BANDAGES/DRESSINGS) ×2 IMPLANT
GLOVE BIO SURGEON STRL SZ7.5 (GLOVE) ×2 IMPLANT
GLOVE BIOGEL PI IND STRL 7.5 (GLOVE) IMPLANT
GLOVE BIOGEL PI IND STRL 8 (GLOVE) ×1 IMPLANT
GLOVE BIOGEL PI INDICATOR 7.5 (GLOVE) ×1
GLOVE BIOGEL PI INDICATOR 8 (GLOVE) ×2
GLOVE ECLIPSE 8.0 STRL XLNG CF (GLOVE) ×2 IMPLANT
GOWN STRL REUS W/TWL XL LVL3 (GOWN DISPOSABLE) ×3 IMPLANT
KIT TURNOVER KIT A (KITS) ×1 IMPLANT
PACK GENERAL/GYN (CUSTOM PROCEDURE TRAY) ×2 IMPLANT
PAD CAST 4YDX4 CTTN HI CHSV (CAST SUPPLIES) ×1 IMPLANT
PADDING CAST COTTON 4X4 STRL (CAST SUPPLIES) ×2
PENCIL SMOKE EVACUATOR (MISCELLANEOUS) IMPLANT
PIN GUIDE DRILL TIP 2.8X300 (DRILL) ×1 IMPLANT
PROTECTOR NERVE ULNAR (MISCELLANEOUS) ×2 IMPLANT
SCREW 16MM THREAD 6.5X75MM (Screw) ×1 IMPLANT
SCREW CANN 6.5X85 TMAX FULL TH (Screw) ×1 IMPLANT
SCREW CANN THRD 6.5X70 (Screw) ×1 IMPLANT
STAPLER VISISTAT 35W (STAPLE) ×2 IMPLANT
SUT VIC AB 0 CT1 27 (SUTURE) ×2
SUT VIC AB 0 CT1 27XBRD ANTBC (SUTURE) ×1 IMPLANT
SUT VIC AB 1 CT1 27 (SUTURE) ×4
SUT VIC AB 1 CT1 27XBRD ANTBC (SUTURE) ×2 IMPLANT
SUT VIC AB 2-0 CT1 27 (SUTURE) ×2
SUT VIC AB 2-0 CT1 TAPERPNT 27 (SUTURE) ×1 IMPLANT
TOWEL OR 17X26 10 PK STRL BLUE (TOWEL DISPOSABLE) ×4 IMPLANT
TRAY FOLEY MTR SLVR 14FR STAT (SET/KITS/TRAYS/PACK) ×1 IMPLANT
WASHER FLAT 6.5MM (Washer) ×1 IMPLANT

## 2019-06-30 NOTE — Anesthesia Procedure Notes (Signed)
Procedure Name: Intubation Date/Time: 06/30/2019 9:29 AM Performed by: Lavina Hamman, CRNA Pre-anesthesia Checklist: Patient identified, Emergency Drugs available, Suction available, Patient being monitored and Timeout performed Patient Re-evaluated:Patient Re-evaluated prior to induction Oxygen Delivery Method: Circle system utilized Preoxygenation: Pre-oxygenation with 100% oxygen Induction Type: IV induction Ventilation: Mask ventilation without difficulty Laryngoscope Size: Mac and 3 Grade View: Grade I Tube type: Oral Tube size: 7.0 mm Number of attempts: 1 Airway Equipment and Method: Stylet Placement Confirmation: ETT inserted through vocal cords under direct vision,  positive ETCO2,  CO2 detector and breath sounds checked- equal and bilateral Secured at: 21 cm Tube secured with: Tape Dental Injury: Teeth and Oropharynx as per pre-operative assessment
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
## 2019-06-30 NOTE — Progress Notes (Signed)
Patient ID: Cathy [name], female   DOB: 22-Sep-1927, 86 y.o.   MRN: [id number] The plan is to proceed to surgery this morning for cannulated screw fixation of her non-displaced left hip femoral neck fracture.  There has been no acute change in her medical status over the past 24 hours.  Her H&H is back up.  Informed consent is obtained.

## 2019-06-30 NOTE — Anesthesia Postprocedure Evaluation (Signed)
Anesthesia Post Note  Patient: Cathy [name]  Procedure(s) Performed: CANNULATED LEFT HIP PINNING (Left Hip)     Patient location during evaluation: PACU Anesthesia Type: General Level of consciousness: patient cooperative and awake Pain management: pain level controlled Vital Signs Assessment: post-procedure vital signs reviewed and stable Respiratory status: spontaneous breathing, nonlabored ventilation, respiratory function stable and patient connected to nasal cannula oxygen Cardiovascular status: blood pressure returned to baseline and stable Postop Assessment: no apparent nausea or vomiting Anesthetic complications: no    Last Vitals:  Vitals:   06/30/19 1045 06/30/19 1100  BP: (!) 161/79 (!) 152/77  Pulse: 62 (!) 59  Resp: 13 12  Temp:  37.3 C  SpO2: 98% 97%    Last Pain:  Vitals:   06/30/19 1100  TempSrc:   PainSc: 0-No pain                 Zarai Orsborn

## 2019-06-30 NOTE — Brief Op Note (Signed)
06/30/2019  10:04 AM  PATIENT:  Cathy [name]  86 y.o. female  PRE-OPERATIVE DIAGNOSIS:  LEFT HIP FRACTURE  POST-OPERATIVE DIAGNOSIS:  LEFT HIP FRACTURE  PROCEDURE:  Procedure(s): CANNULATED LEFT HIP PINNING (Left)  SURGEON:  Surgeon(s) and Role:    Mcarthur Rossetti, MD - Primary  ANESTHESIA:   general  EBL:  50 mL   COUNTS:  YES  DICTATION: .Other Dictation: Dictation Number [id number]  PLAN OF CARE: Admit to inpatient   PATIENT DISPOSITION:  PACU - hemodynamically stable.   Delay start of Pharmacological VTE agent (>24hrs) due to surgical blood loss or risk of bleeding: no

## 2019-06-30 NOTE — Progress Notes (Signed)
Nurse paged physician covering floor about patient's B/P 86/49, the nurse rechecked the patient and her B/P is 114/65, pulse 63, and 02 Sat 97%.

## 2019-06-30 NOTE — Transfer of Care (Signed)
Immediate Anesthesia Transfer of Care Note  Patient: Cathy [name]  Procedure(s) Performed: Procedure(s): CANNULATED LEFT HIP PINNING (Left)  Patient Location: PACU  Anesthesia Type:General  Level of Consciousness:  sedated, patient cooperative and responds to stimulation  Airway & Oxygen Therapy:Patient Spontanous Breathing and Patient connected to face mask oxgen  Post-op Assessment:  Report given to PACU RN and Post -op Vital signs reviewed and stable  Post vital signs:  Reviewed and stable  Last Vitals:  Vitals:   06/30/19 0322 06/30/19 0629  BP: 132/67 131/76  Pulse: (!) 57 (!) 59  Resp: 18 20  Temp: (!) 36.3 C 36.5 C  SpO2: [id number]    Complications: No apparent anesthesia complications

## 2019-06-30 NOTE — Anesthesia Preprocedure Evaluation (Addendum)
Anesthesia Evaluation  Patient identified by MRN, date of birth, ID band Patient awake    Reviewed: Allergy & Precautions, NPO status , Patient's Chart, lab work & pertinent test results  History of Anesthesia Complications Negative for: history of anesthetic complications  Airway Mallampati: III  TM Distance: >3 FB Neck ROM: Full    Dental  (+) Dental Advisory Given   Pulmonary neg shortness of breath, neg sleep apnea, neg COPD, neg recent URI, former smoker,    breath sounds clear to auscultation       Cardiovascular hypertension, + CAD and +CHF   Rhythm:Irregular  2018 tte:  - Left ventricle: The cavity size was normal. Wall thickness was  increased in a pattern of moderate LVH. Systolic function was  normal. The estimated ejection fraction was in the range of 55%  to 60%. Wall motion was normal; there were no regional wall  motion abnormalities. Doppler parameters are consistent with a  reversible restrictive pattern, indicative of decreased left  ventricular diastolic compliance and/or increased left atrial  pressure (grade 3 diastolic dysfunction).  - Aortic valve: Mildly calcified annulus. Trileaflet; normal  thickness leaflets.  - Mitral valve: Moderately calcified annulus.  - Left atrium: The atrium was mildly to moderately dilated.  - Right atrium: The atrium was mildly dilated.  - Pulmonary arteries: Systolic pressure was mildly increased. PA  peak pressure: 39 mm Hg (S).   Neuro/Psych PSYCHIATRIC DISORDERS Anxiety  Neuromuscular disease    GI/Hepatic Neg liver ROS, hiatal hernia, GERD  Medicated and Controlled,  Endo/Other  Hypothyroidism   Renal/GU negative Renal ROS     Musculoskeletal  (+) Arthritis , Left hip fracture   Abdominal   Peds  Hematology  (+) Blood dyscrasia, anemia , Hgb 10.3, Plt 105, INR 1.3 on eliquis for afib   Anesthesia Other Findings   Reproductive/Obstetrics                            Anesthesia Physical Anesthesia Plan  ASA: III  Anesthesia Plan: General   Post-op Pain Management:    Induction: Intravenous  PONV Risk Score and Plan: 3 and Ondansetron and Dexamethasone  Airway Management Planned: Oral ETT  Additional Equipment: None  Intra-op Plan:   Post-operative Plan: Extubation in OR  Informed Consent: I have reviewed the patients History and Physical, chart, labs and discussed the procedure including the risks, benefits and alternatives for the proposed anesthesia with the patient or authorized representative who has indicated his/her understanding and acceptance.     Dental advisory given  Plan Discussed with: CRNA and Surgeon  Anesthesia Plan Comments:         Anesthesia Quick Evaluation

## 2019-06-30 NOTE — Progress Notes (Signed)
Clarified with Both Dr. Lorin Mercy and Ninfa Linden that no POst-op x-ray is needed

## 2019-07-01 DIAGNOSIS — S72002D Fracture of unspecified part of neck of left femur, subsequent encounter for closed fracture with routine healing: Secondary | ICD-10-CM

## 2019-07-01 NOTE — Progress Notes (Signed)
   Subjective: 1 Day Post-Op Procedure(s) (LRB): CANNULATED LEFT HIP PINNING (Left) Patient reports pain as mild.    Objective: Vital signs in last 24 hours: Temp:  [97.9 F (36.6 C)-99.1 F (37.3 C)] 97.9 F (36.6 C) (05/02 0538) Pulse Rate:  [53-64] 53 (05/02 0538) Resp:  [12-20] 20 (05/02 0538) BP: (86-161)/(49-79) 124/67 (05/02 0538) SpO2:  [97 %-99 %] 98 % (05/02 0538)  Intake/Output from previous day: 05/01 0701 - 05/02 0700 In: 750 [I.V.:650; IV Piggyback:100] Out: 1000 [Urine:950; Blood:50] Intake/Output this shift: No intake/output data recorded.  Recent Labs    06/28/19 1701 06/28/19 2120 06/29/19 0500 06/30/19 0625  HGB 7.9* 10.3* 11.0* 10.3*   Recent Labs    06/29/19 0500 06/30/19 0625  WBC 6.5 7.1  RBC 3.62* 3.47*  HCT 34.0* 32.8*  PLT 128* 105*   Recent Labs    06/28/19 1701 06/30/19 0625  NA 139 138  K 3.6 3.8  CL 110 105  CO2 24 26  BUN 23 23  CREATININE 1.18* 0.94  GLUCOSE 96 74  CALCIUM 7.6* 8.4*   Recent Labs    06/28/19 2120  INR 1.3*    Neurologically intact No results found.  Assessment/Plan: 1 Day Post-Op Procedure(s) (LRB): CANNULATED LEFT HIP PINNING (Left) Plan: daughter at bedside and expressed her mothers weak UE and not being able to NWB with transfers. Etc. Requesting SNF  Marybelle Killings 07/01/2019, 10:34 AM

## 2019-07-01 NOTE — Evaluation (Signed)
Physical Therapy Evaluation Patient Details Name: Cathy [name] MRN: [id number] DOB: [birth year] Today's Date: 07/01/2019   History of Present Illness  86 year old female with past medical history of atrial fibrillation and flutter on Eliquis, vitamin B12 deficiency, iron deficiency anemia, congestive heart failure both systolic and diastolic dysfunction last recorded ejection fraction of 55 to 60% in 2018, hypertension, hyperlipidemia, osteoarthritis who presents to [facility] emergency department status post fall with severe left hip pain. ortho consulted for L hip fx. pt is now s/p cannulated screw fixation L hip per Dr. Ninfa Linden on 06/30/19  Clinical Impression  Pt admitted with above diagnosis.  Pt independent at her baseline. Anticipate steady progress in acute setting. Will likely benefit from SNF post acute.  Pt currently with functional limitations due to the deficits listed below (see PT Problem List). Pt will benefit from skilled PT to increase their independence and safety with mobility to allow discharge to the venue listed below.       Follow Up Recommendations SNF    Equipment Recommendations  None recommended by PT    Recommendations for Other Services       Precautions / Restrictions Precautions Precautions: Fall Restrictions Weight Bearing Restrictions: Yes LLE Weight Bearing: Partial weight bearing LLE Partial Weight Bearing Percentage or Pounds: up to 50% (confirmed PWB with MD today ---family stating MD told them "absolutely no wt" on LLE)      Mobility  Bed Mobility Overal bed mobility: Needs Assistance Bed Mobility: Supine to Sit     Supine to sit: Min guard     General bed mobility comments: for safety  Transfers Overall transfer level: Needs assistance Equipment used: Rolling walker (2 wheeled) Transfers: Sit to/from [facility] Sit to Stand: Min assist Stand pivot transfers: Min assist;+2 safety/equipment       General  transfer comment: cues for hand placement, sequence and PWB  Ambulation/Gait             General Gait Details: pivotal steps to chair  Stairs            Wheelchair Mobility    Modified Rankin (Stroke Patients Only)       Balance Overall balance assessment: Needs assistance Sitting-balance support: Feet supported;No upper extremity supported Sitting balance-Leahy Scale: Good     Standing balance support: Bilateral upper extremity supported;During functional activity Standing balance-Leahy Scale: Poor Standing balance comment: reliant on UEs                             Pertinent Vitals/Pain Pain Assessment: Faces Faces Pain Scale: Hurts a little bit Pain Descriptors / Indicators: Grimacing;Sore Pain Intervention(s): Monitored during session;Limited activity within patient's tolerance;Repositioned    Home Living Family/patient expects to be discharged to:: Skilled nursing facility Living Arrangements: Alone               Additional Comments: per dtr pt cleans her house everyday, dusts and sweeps    Prior Function Level of Independence: Independent with assistive device(s);Independent               Hand Dominance        Extremity/Trunk Assessment   Upper Extremity Assessment Upper Extremity Assessment: Defer to OT evaluation    Lower Extremity Assessment Lower Extremity Assessment: LLE deficits/detail LLE Deficits / Details: grossly 2+ to 3/5, limited by post op pain and weakness       Communication   Communication: [facility]  Cognition  Arousal/Alertness: Awake/alert Behavior During Therapy: WFL for tasks assessed/performed Overall Cognitive Status: Within Functional Limits for tasks assessed                                        General Comments      Exercises     Assessment/Plan    PT Assessment Patient needs continued PT services  PT Problem List Decreased strength;Decreased activity  tolerance;Decreased balance;Decreased mobility;Decreased knowledge of use of DME       PT Treatment Interventions DME instruction;Therapeutic exercise;Functional mobility training;Therapeutic activities;Patient/family education;Gait training;Balance training    PT Goals (Current goals can be found in the Care Plan section)  Acute Rehab PT Goals Patient Stated Goal: return to independence PT Goal Formulation: With patient Time For Goal Achievement: 07/15/19    Frequency Min 3X/week   Barriers to discharge        Co-evaluation PT/OT/SLP Co-Evaluation/Treatment: Yes Reason for Co-Treatment: To address functional/ADL transfers PT goals addressed during session: Mobility/safety with mobility OT goals addressed during session: ADL's and self-care       AM-PAC PT "6 Clicks" Mobility  Outcome Measure Help needed turning from your back to your side while in a flat bed without using bedrails?: A Little Help needed moving from lying on your back to sitting on the side of a flat bed without using bedrails?: A Little Help needed moving to and from a bed to a chair (including a wheelchair)?: A Little Help needed standing up from a chair using your arms (e.g., wheelchair or bedside chair)?: A Little Help needed to walk in hospital room?: A Lot Help needed climbing 3-5 steps with a railing? : A Lot 6 Click Score: 16    End of Session Equipment Utilized During Treatment: Gait belt Activity Tolerance: Patient tolerated treatment well Patient left: in chair;with call bell/phone within reach;with chair alarm set;with family/visitor present   PT Visit Diagnosis: Difficulty in walking, not elsewhere classified (R26.2)    Time: [id number] PT Time Calculation (min) (ACUTE ONLY): 19 min   Charges:   PT Evaluation $PT Eval Low Complexity: [place], PT   Acute Rehab Dept [facility]): [id number]   07/01/2019   [facility] 07/01/2019, 1:47 PM

## 2019-07-01 NOTE — Progress Notes (Signed)
07/01/19 1511  PT Visit Information  Last PT Received On 07/01/19  Assistance Needed +1  Pt attempting to get out of chair, assisted pt with short distance amb and then back to bed. Complaining of more pain this pm. RN aware. Will continue to follow in acute setting.   History of Present Illness 86 year old female with past medical history of atrial fibrillation and flutter on Eliquis, vitamin B12 deficiency, iron deficiency anemia, congestive heart failure both systolic and diastolic dysfunction last recorded ejection fraction of 55 to 60% in 2018, hypertension, hyperlipidemia, osteoarthritis who presents to [name] emergency department status post fall with severe left hip pain. ortho consulted for L hip fx. pt is now s/p cannulated screw fixation L hip per Dr. Ninfa Linden on 06/30/19  Subjective Data  Patient Stated Goal return to independence  Precautions  Precautions Fall  Restrictions  LLE Weight Bearing PWB  LLE Partial Weight Bearing Percentage or Pounds up to 50% (confirmed PWB with MD today ---family stating MD told them "absolutely no wt" on LLE)  Pain Assessment  Pain Assessment Faces  Faces Pain Scale 4  Pain Location L hip  Pain Descriptors / Indicators Grimacing;Sore  Pain Intervention(s) Limited activity within patient's tolerance;Monitored during session;Repositioned;Patient requesting pain meds-RN notified (declines ice )  Cognition  Arousal/Alertness Awake/alert  Behavior During Therapy WFL for tasks assessed/performed  Overall Cognitive Status Within Functional Limits for tasks assessed  Bed Mobility  Overal bed mobility Needs Assistance  Bed Mobility Sit to Supine  Sit to supine Min guard  General bed mobility comments for safety, incr time d/t pain  Transfers  Overall transfer level Needs assistance  Equipment used Rolling walker (2 wheeled)  Transfers Sit to/from Stand  Sit to Stand Min assist  General transfer comment cues for hand placement,  sequence   Ambulation/Gait  Ambulation/Gait assistance Min assist  Gait Distance (Feet) 5 Feet  Assistive device Rolling walker (2 wheeled)  Gait Pattern/deviations Step-to pattern  General Gait Details cues for sequence,  RW position and PWB. steps forward-back and then performed stand pivot to bed  Balance  Overall balance assessment Needs assistance  Standing balance support Bilateral upper extremity supported;During functional activity  Standing balance-Leahy Scale Poor  Standing balance comment reliant on UEs  PT - End of Session  Equipment Utilized During Treatment Gait belt  Activity Tolerance Patient tolerated treatment well  Patient left in bed;with call bell/phone within reach;with bed alarm set;with family/visitor present   PT - Assessment/Plan  PT Plan Current plan remains appropriate  PT Visit Diagnosis Difficulty in walking, not elsewhere classified (R26.2)  PT Frequency (ACUTE ONLY) Min 3X/week  Follow Up Recommendations SNF  PT equipment None recommended by PT  AM-PAC PT "6 Clicks" Mobility Outcome Measure (Version 2)  Help needed turning from your back to your side while in a flat bed without using bedrails? 3  Help needed moving from lying on your back to sitting on the side of a flat bed without using bedrails? 3  Help needed moving to and from a bed to a chair (including a wheelchair)? 3  Help needed standing up from a chair using your arms (e.g., wheelchair or bedside chair)? 3  Help needed to walk in hospital room? 2  Help needed climbing 3-5 steps with a railing?  2  6 Click Score 16  Consider Recommendation of Discharge To: Home with [facility]  PT Goal Progression  Progress towards PT goals Progressing toward goals  Acute Rehab PT Goals  PT Goal Formulation With patient  Time For Goal Achievement 07/15/19  PT Time Calculation  PT Start Time (ACUTE ONLY) 1521  PT Stop Time (ACUTE ONLY) 1537  PT Time Calculation (min) (ACUTE ONLY) 16 min  PT General Charges  $$  ACUTE PT VISIT 1 Visit  PT Treatments  $Gait Training 8-22 mins

## 2019-07-01 NOTE — Evaluation (Signed)
Occupational Therapy Evaluation Patient Details Name: Cathy [name] MRN: [id number] DOB: May 04, 1927 Today's Date: 07/01/2019    History of Present Illness 86 year old female with past medical history of atrial fibrillation and flutter on Eliquis, vitamin B12 deficiency, iron deficiency anemia, congestive heart failure both systolic and diastolic dysfunction last recorded ejection fraction of 55 to 60% in 2018, hypertension, hyperlipidemia, osteoarthritis who presents to [facility] emergency department status post fall with severe left hip pain. ortho consulted for L hip fx. pt is now s/p cannulated screw fixation L hip per Dr. Ninfa Linden on 06/30/19   Clinical Impression   Pt admitted with hip fx. Pt currently with functional limitations due to the deficits listed below (see OT Problem List).  Pt will benefit from skilled OT to increase their safety and independence with ADL and functional mobility for ADL to facilitate discharge to venue listed below.      Follow Up Recommendations  SNF    Equipment Recommendations  None recommended by OT    Recommendations for Other Services       Precautions / Restrictions Precautions Precautions: Fall Restrictions Weight Bearing Restrictions: Yes LLE Weight Bearing: Partial weight bearing LLE Partial Weight Bearing Percentage or Pounds: up to 50% (confirmed PWB with MD today ---family stating MD told them "absolutely no wt" on LLE)      Mobility Bed Mobility Overal bed mobility: Needs Assistance Bed Mobility: Supine to Sit     Supine to sit: Min guard     General bed mobility comments: for safety  Transfers Overall transfer level: Needs assistance Equipment used: Rolling walker (2 wheeled) Transfers: Sit to/from [facility] Sit to Stand: Min assist Stand pivot transfers: Min assist;+2 safety/equipment       General transfer comment: cues for hand placement, sequence and PWB    Balance Overall balance  assessment: Needs assistance Sitting-balance support: Feet supported;No upper extremity supported Sitting balance-Leahy Scale: Good     Standing balance support: Bilateral upper extremity supported;During functional activity Standing balance-Leahy Scale: Poor Standing balance comment: reliant on UEs                           ADL either performed or assessed with clinical judgement   ADL Overall ADL's : Needs assistance/impaired Eating/Feeding: Set up;Sitting   Grooming: Sitting;Set up   Upper Body Bathing: Set up;Sitting   Lower Body Bathing: Maximal assistance;Sit to/from stand;Cueing for compensatory techniques;Cueing for safety;Cueing for sequencing   Upper Body Dressing : Set up;Sitting   Lower Body Dressing: Maximal assistance;Sit to/from stand;Cueing for sequencing;Cueing for compensatory techniques;Cueing for safety   Toilet Transfer: Moderate assistance;RW;Cueing for safety;Cueing for sequencing;Stand-pivot Toilet Transfer Details (indicate cue type and reason): bed to chair                 Vision Patient Visual Report: No change from baseline              Pertinent Vitals/Pain Pain Assessment: Faces Faces Pain Scale: Hurts a little bit Pain Location: L hip Pain Descriptors / Indicators: Grimacing;Sore Pain Intervention(s): Monitored during session;Limited activity within patient's tolerance;Repositioned     Hand Dominance     Extremity/Trunk Assessment Upper Extremity Assessment Upper Extremity Assessment: Generalized weakness   Lower Extremity Assessment Lower Extremity Assessment: LLE deficits/detail LLE Deficits / Details: grossly 2+ to 3/5, limited by post op pain and weakness       Communication Communication Communication: HOH   Cognition Arousal/Alertness: Awake/alert Behavior  During Therapy: WFL for tasks assessed/performed Overall Cognitive Status: Within Functional Limits for tasks assessed                                                 Home Living Family/patient expects to be discharged to:: Skilled nursing facility Living Arrangements: Alone                               Additional Comments: per dtr pt cleans her house everyday, dusts and sweeps      Prior Functioning/Environment Level of Independence: Independent with assistive device(s);Independent                 OT Problem List: Decreased strength;Impaired balance (sitting and/or standing);Decreased safety awareness;Decreased knowledge of precautions;Decreased knowledge of use of DME or AE      OT Treatment/Interventions:      OT Goals(Current goals can be found in the care plan section) Acute Rehab OT Goals Patient Stated Goal: return to independence OT Goal Formulation: With patient Time For Goal Achievement: 07/08/19 Potential to Achieve Goals: Good ADL Goals Pt Will Perform Lower Body Dressing: with min assist;sit to/from stand;with caregiver independent in assisting Pt Will Transfer to Toilet: with min assist;bedside commode;stand pivot transfer Pt Will Perform Toileting - Clothing Manipulation and hygiene: with min assist;sit to/from stand  OT Frequency: Min 2X/week   Barriers to D/C:            Co-evaluation   Reason for Co-Treatment: To address functional/ADL transfers PT goals addressed during session: Mobility/safety with mobility OT goals addressed during session: ADL's and self-care      AM-PAC OT "6 Clicks" Daily Activity     Outcome Measure Help from another person eating meals?: None Help from another person taking care of personal grooming?: A Little Help from another person toileting, which includes using toliet, bedpan, or urinal?: A Lot Help from another person bathing (including washing, rinsing, drying)?: A Lot Help from another person to put on and taking off regular upper body clothing?: A Little Help from another person to put on and taking off regular lower body  clothing?: A Lot 6 Click Score: 16   End of Session Equipment Utilized During Treatment: Gait belt;Rolling walker Nurse Communication: Mobility status  Activity Tolerance: Patient tolerated treatment well Patient left: in chair;with call bell/phone within reach;with chair alarm set;with family/visitor present  OT Visit Diagnosis: Unsteadiness on feet (R26.81);Other abnormalities of gait and mobility (R26.89);Muscle weakness (generalized) (M62.81);History of falling (Z91.81)                Time: [id number] OT Time Calculation (min): 24 min Charges:  OT General Charges $OT Visit: 1 Visit OT Evaluation $OT Eval Low Complexity: 1 Low  Kari Baars, OT Acute Rehabilitation Services [phone] Office- [phone]     Dequavious Harshberger, Edwena Felty D 07/01/2019, 3:12 PM
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
## 2019-07-01 NOTE — Op Note (Signed)
NAME: Cathy [name], FLIS MEDICAL RECORD [id number] ACCOUNT [id number] DATE OF BIRTH:[birth year] FACILITY: [facility] LOCATION: [facility] PHYSICIAN:Kapil Petropoulos Kerry Fort, MD  OPERATIVE REPORT  DATE OF PROCEDURE:  06/30/2019  PREOPERATIVE DIAGNOSIS:  Left hip with nondisplaced femoral neck fracture.  POSTOPERATIVE DIAGNOSIS:  Left hip with nondisplaced femoral neck fracture.  PROCEDURE:  Left hip cannulated screw fixation.  IMPLANTS:  Biomet-Zimmer 6.5 mm cannulated screws x3.  SURGEON:  Lind Guest. Ninfa Linden, MD  ANESTHESIA:  General.  ANTIBIOTICS:  900 mg IV clindamycin.  ESTIMATED BLOOD LOSS:  About 50 mL.  COMPLICATIONS:  None.  INDICATIONS:  The patient is an 86 year old female who sustained a mechanical fall this past Thursday.  This was a witnessed mechanical fall that was without loss of consciousness.  She had the inability to ambulate after with severe left hip pain.  She  was seen in the [facility] Emergency Room and a CT scan was obtained of her hip after plain films were questioning on whether or not it was a fracture.  The CT scan did confirm a completely nondisplaced femoral neck fracture.  Her leg lengths were equal  and she was tolerating some rotation of the hip.  It was felt that cannulated screw fixation would be the most appropriate treatment for now.  I did have a long and thorough discussion with her family about the potential for failure of this that would  necessitate a partial hip replacement.  We discussed in detail nonoperative and operative treatment measures and the patient's family did elect to proceed with surgery.  We did wait at least today because the patient has been on Eliquis and her last dose  was Thursday morning, so it was safer to wait until today for surgery for bleeding purposes.  DESCRIPTION OF PROCEDURE:  After informed consent was obtained, appropriate left hip was marked.  She was brought to the operating room where general anesthesia was  obtained while she was on her stretcher.  She was then placed supine on the fracture  table.  Her left hip was placed in in-line skeletal traction with no traction applied.  A perineal post was placed and her right nonoperative leg was placed in a well leg holder with appropriate flexion and abduction and protection, protecting her bony  prominences in the popliteal area.  We then assessed her left hip fracture under direct fluoroscopy and found the fracture to be completely reduced and nondisplaced.  We then prepped the left hip with DuraPrep and sterile drapes, including a sterile  shower curtain drape.  Timeout was called to identify correct patient, correct left hip.  I then made an incision over the lateral aspect of the proximal femur and dissected down to the bone.  I opened up the IT band.  I then placed 3 temporary guide  pins in an inverted triangle format under direct fluoroscopy with the appropriate position for the implants for securing this to the bone.  I then breached the near cortex with a drill and placed our 3 cannulated screws without difficulty.  The inferior  screw placed was a fully threaded screw to get maximal purchase of the bone and the other screws placed with partially threaded screws.  One of the screws, I did place a washer.  I put the hip through internal and external rotation after this, assessing  under direct fluoroscopy as well and it moved as a unit and I was pleased with the fixation.  We then irrigated the soft tissue with normal saline solution.  I closed the deep tissue was 0 Vicryl followed by 2-0 Vicryl to close the subcutaneous tissue  and interrupted staples to reapproximate the skin.  Xeroform and Aquacel dressing was applied.  She was taken off the fracture table, awakened, extubated, and taken to recovery room in stable condition.  All final counts were correct.  There were no  complications noted.  JN/NUANCE  D:06/30/2019 T:07/01/2019 JOB:[id number]

## 2019-07-02 ENCOUNTER — Encounter: Payer: Self-pay | Admitting: *Deleted

## 2019-07-02 LAB — CBC
HCT: 31.4 % — ABNORMAL LOW (ref 36.0–46.0)
Hemoglobin: 9.8 g/dL — ABNORMAL LOW (ref 12.0–15.0)
MCH: 29.6 pg (ref 26.0–34.0)
MCHC: 31.2 g/dL (ref 30.0–36.0)
MCV: 94.9 fL (ref 80.0–100.0)
Platelets: 132 K/uL — ABNORMAL LOW (ref 150–400)
RBC: 3.31 MIL/uL — ABNORMAL LOW (ref 3.87–5.11)
RDW: 14.9 % (ref 11.5–15.5)
WBC: 7 K/uL (ref 4.0–10.5)
nRBC: 0 % (ref 0.0–0.2)

## 2019-07-02 LAB — BASIC METABOLIC PANEL
Anion gap: 3 — ABNORMAL LOW (ref 5–15)
BUN: 22 mg/dL (ref 8–23)
CO2: 29 mmol/L (ref 22–32)
Calcium: 8.4 mg/dL — ABNORMAL LOW (ref 8.9–10.3)
Chloride: 105 mmol/L (ref 98–111)
Creatinine, Ser: 0.94 mg/dL (ref 0.44–1.00)
GFR calc Af Amer: >60 mL/min (ref >60)
GFR calc non Af Amer: 54 mL/min — ABNORMAL LOW (ref >60)
Glucose, Bld: 87 mg/dL (ref 70–99)
Potassium: 4.7 mmol/L (ref 3.5–5.1)
Sodium: 137 mmol/L (ref 135–145)

## 2019-07-02 NOTE — Progress Notes (Addendum)
CCMD notified RN that pt has had longer pauses 2.09 and 2.23. On call Ouma NP paged. Pt was asleep during events, pt is arousable w/ no complaints.   Whitney notified RN again about pauses, documented in ECG intervals. Pauses progressively getting longer. On call Ouma notified. Pt arouses easily, states she is in no pain just tired. EKG ordered.  Will continue to monitor.

## 2019-07-02 NOTE — Progress Notes (Signed)
Physical Therapy Treatment Patient Details Name: Cathy [name] MRN: [id number] DOB: Jul 25, 1927 Today's Date: 07/02/2019    History of Present Illness 86 year old female with past medical history of atrial fibrillation and flutter on Eliquis, vitamin B12 deficiency, iron deficiency anemia, congestive heart failure both systolic and diastolic dysfunction last recorded ejection fraction of 55 to 60% in 2018, hypertension, hyperlipidemia, osteoarthritis who presents to [facility] emergency department status post fall with severe left hip pain. ortho consulted for L hip fx. pt is now s/p cannulated screw fixation L hip per Dr. Ninfa Linden on 06/30/19    PT Comments    Pt tolerated increased ambulation distance of 22' with RW, distance limited by fatigue. Reviewed PWB precautions. Performed LLE strengthening exercises. Pt tolerated tx well.   Follow Up Recommendations  SNF     Equipment Recommendations  None recommended by PT    Recommendations for Other Services       Precautions / Restrictions Precautions Precautions: Fall Restrictions Weight Bearing Restrictions: Yes LLE Weight Bearing: Partial weight bearing LLE Partial Weight Bearing Percentage or Pounds: up to 50% (confirmed PWB with MD 07/01/19 ---family stating MD told them "absolutely no wt" on LLE)    Mobility  Bed Mobility Overal bed mobility: Needs Assistance Bed Mobility: Supine to Sit     Supine to sit: Min assist     General bed mobility comments: up in recliner  Transfers Overall transfer level: Needs assistance Equipment used: Rolling walker (2 wheeled) Transfers: Sit to/from Stand Sit to Stand: Min assist Stand pivot transfers: Min assist       General transfer comment: cues for hand placement, sequence   Ambulation/Gait Ambulation/Gait assistance: Min guard Gait Distance (Feet): 22 Feet Assistive device: Rolling walker (2 wheeled) Gait Pattern/deviations: Step-to pattern;Decreased stride  length;Trunk flexed Gait velocity: decr   General Gait Details: VCs sequencing, no loss of balance, distance limited by fatigue, kyphotic posture (pt stated this is baseline)   Chief Strategy Officer    Modified Rankin (Stroke Patients Only)       Balance Overall balance assessment: Needs assistance Sitting-balance support: Feet supported;No upper extremity supported Sitting balance-Leahy Scale: Good     Standing balance support: Bilateral upper extremity supported;During functional activity Standing balance-Leahy Scale: Poor Standing balance comment: reliant on UEs                            Cognition Arousal/Alertness: Awake/alert Behavior During Therapy: WFL for tasks assessed/performed Overall Cognitive Status: Within Functional Limits for tasks assessed                                        Exercises Total Joint Exercises Ankle Circles/Pumps: AROM;Both;10 reps;Supine Heel Slides: AAROM;Left;10 reps;Supine Hip ABduction/ADduction: AAROM;Left;10 reps;Supine Long Arc Quad: AROM;Left;10 reps;Seated    General Comments        Pertinent Vitals/Pain Pain Assessment: No/denies pain Faces Pain Scale: Hurts a little bit Pain Location: L hip with activity, pt declined pain medication and ice Pain Descriptors / Indicators: Grimacing Pain Intervention(s): Limited activity within patient's tolerance;Monitored during session    Home Living                      Prior Function  PT Goals (current goals can now be found in the care plan section) Acute Rehab PT Goals Patient Stated Goal: return to independence PT Goal Formulation: With patient Time For Goal Achievement: 07/15/19 Progress towards PT goals: Progressing toward goals    Frequency    Min 3X/week      PT Plan Current plan remains appropriate    Co-evaluation              AM-PAC PT "6 Clicks" Mobility   Outcome Measure   Help needed turning from your back to your side while in a flat bed without using bedrails?: A Little Help needed moving from lying on your back to sitting on the side of a flat bed without using bedrails?: A Little Help needed moving to and from a bed to a chair (including a wheelchair)?: A Little Help needed standing up from a chair using your arms (e.g., wheelchair or bedside chair)?: A Little Help needed to walk in hospital room?: A Little Help needed climbing 3-5 steps with a railing? : A Lot 6 Click Score: 17    End of Session Equipment Utilized During Treatment: Gait belt Activity Tolerance: Patient tolerated treatment well Patient left: in chair;with call bell/phone within reach;with chair alarm set Nurse Communication: Mobility status PT Visit Diagnosis: Difficulty in walking, not elsewhere classified (R26.2)     Time: 1425-1440 PT Time Calculation (min) (ACUTE ONLY): 15 min  Charges:  $Gait Training: 8-22 mins                     Blondell Reveal Kistler PT 07/02/2019  Acute Rehabilitation Services Pager [phone] Office [phone]

## 2019-07-02 NOTE — Progress Notes (Signed)
Occupational Therapy Treatment Patient Details Name: BREEONA RISDEN MRN: [id number] DOB: 1927-12-08 Today's Date: 07/02/2019    History of present illness 86 year old female with past medical history of atrial fibrillation and flutter on Eliquis, vitamin B12 deficiency, iron deficiency anemia, congestive heart failure both systolic and diastolic dysfunction last recorded ejection fraction of 55 to 60% in 2018, hypertension, hyperlipidemia, osteoarthritis who presents to [facility] emergency department status post fall with severe left hip pain. ortho consulted for L hip fx. pt is now s/p cannulated screw fixation L hip per Dr. Ninfa Linden on 06/30/19   OT comments  Pt did well with PWB with VC  Follow Up Recommendations  SNF    Equipment Recommendations  None recommended by OT    Recommendations for Other Services      Precautions / Restrictions Restrictions Weight Bearing Restrictions: Yes LLE Weight Bearing: Touchdown weight bearing LLE Partial Weight Bearing Percentage or Pounds: up to 50% (confirmed PWB with MD today ---family stating MD told them "absolutely no wt" on LLE)       Mobility Bed Mobility Overal bed mobility: Needs Assistance Bed Mobility: Supine to Sit     Supine to sit: Min assist     General bed mobility comments: for safety, incr time d/t pain  Transfers Overall transfer level: Needs assistance Equipment used: Rolling walker (2 wheeled) Transfers: Sit to/from Stand Sit to Stand: Min assist Stand pivot transfers: Min assist       General transfer comment: cues for hand placement, sequence     Balance Overall balance assessment: Needs assistance         Standing balance support: Bilateral upper extremity supported;During functional activity Standing balance-Leahy Scale: Poor Standing balance comment: reliant on UEs                           ADL either performed or assessed with clinical judgement   ADL Overall ADL's : Needs  assistance/impaired                         Toilet Transfer: RW;Cueing for safety;Cueing for sequencing;Stand-pivot;Minimal assistance   Toileting- Clothing Manipulation and Hygiene: Minimal assistance;Sit to/from stand;Cueing for safety;Cueing for compensatory techniques       Functional mobility during ADLs: Minimal assistance;Moderate assistance;Rolling walker General ADL Comments: MAX VC for PWB     Vision Patient Visual Report: No change from baseline     Perception     Praxis      Cognition Arousal/Alertness: Awake/alert Behavior During Therapy: WFL for tasks assessed/performed Overall Cognitive Status: Within Functional Limits for tasks assessed                                                     Pertinent Vitals/ Pain       Pain Assessment: No/denies pain         Frequency  Min 2X/week        Progress Toward Goals  OT Goals(current goals can now be found in the care plan section)  Progress towards OT goals: Progressing toward goals     Plan Discharge plan remains appropriate    Co-evaluation                 AM-PAC OT "6 Clicks" Daily Activity  Outcome Measure   Help from another person eating meals?: None Help from another person taking care of personal grooming?: A Little Help from another person toileting, which includes using toliet, bedpan, or urinal?: A Lot Help from another person bathing (including washing, rinsing, drying)?: A Lot Help from another person to put on and taking off regular upper body clothing?: A Little Help from another person to put on and taking off regular lower body clothing?: A Lot 6 Click Score: 16    End of Session Equipment Utilized During Treatment: Gait belt;Rolling walker  OT Visit Diagnosis: Unsteadiness on feet (R26.81);Other abnormalities of gait and mobility (R26.89);Muscle weakness (generalized) (M62.81);History of falling (Z91.81)   Activity Tolerance Patient  tolerated treatment well   Patient Left in chair;with call bell/phone within reach;with chair alarm set;with family/visitor present   Nurse Communication Mobility status        Time: [id number] OT Time Calculation (min): 19 min  Charges: OT General Charges $OT Visit: 1 Visit OT Treatments $Self Care/Home Management : 8-22 mins  Kari Baars, [place] [phone] Office- [phone]      [place], Edwena Felty D 07/02/2019, 2:18 PM
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
## 2019-07-02 NOTE — Progress Notes (Signed)
Patient ID: Cathy [name], female   DOB: Jul 20, 1927, 86 y.o.   MRN: [id number] No acute changes.  Left hip incision clean and dry.  Can continue to work with therapy on 50% weight only left hip. Can go to short-term skilled nursing from Ortho standpoint.

## 2019-07-02 NOTE — NC FL2 (Signed)
[place] LEVEL OF CARE SCREENING TOOL     IDENTIFICATION  Patient Name: Cathy [name] Birthdate: 1927-04-17 Sex: female Admission Date (Current Location): 06/28/2019  [facility] and [place] Number:  Herbalist and Address:  [facility],  [place] [address] [place]      Provider Number: [id number]  Attending Physician Name and Address:  Mikki Harbor, *  Relative Name and Phone Number:  Dalbert Mayotte, Hilda    Current Level of Care: Hospital Recommended Level of Care: [place] Prior Approval Number:    Date Approved/Denied:   PASRR Number: [id number]  Discharge Plan: SNF    Current Diagnoses: Patient Active Problem List   Diagnosis Date Noted  . Hip fracture ([place]) 06/29/2019  . Fracture of femoral neck, left, closed ([place]) 06/28/2019  . GERD without esophagitis 06/28/2019  . Noninfected skin tear of leg, right, initial encounter 04/19/2018  . Bowel obstruction ([place]) 02/21/2018  . Hypothyroidism 02/21/2018  . Osteoarthritis of left shoulder 08/10/2017  . Pain in joint of left shoulder 08/10/2017  . Mass of joint of left wrist 06/22/2017  . Arthritis of left wrist 06/21/2017  . Carpal tunnel syndrome 06/21/2017  . Tenosynovitis of left wrist 06/21/2017  . Fracture of distal end of humerus 03/25/2017  . Closed supracondylar fracture of left humerus 01/15/2017  . Gait disorder 01/15/2017  . Recurrent falls 01/15/2017  . Rib fractures 11/28/2016  . Skin lesion 09/30/2016  . Ganglion cyst of dorsum of left wrist 09/30/2016  . Rotator cuff arthropathy, right 04/26/2016  . Hyperglycemia 04/01/2016  . Right arm pain 04/01/2016  . Wrist swelling 09/30/2015  . Preventative health care 04/02/2015  . Chronic combined systolic and diastolic CHF (congestive heart failure) ([place]) 03/17/2015  . Coronary artery calcification seen on CT scan 03/17/2015  . Sinus bradycardia 03/17/2015  . Splenic  lesion 02/18/2015  . Liver lesion 02/18/2015  . CAD (coronary artery disease), native coronary artery 02/03/2015  . Atrial fibrillation and flutter ([place]) 02/02/2015  . Fatigue 01/31/2015  . Abnormal CT of the abdomen 01/31/2015  . Prolonged Q-T interval on ECG 01/31/2015  . Hepatic lesion   . Constipation 12/03/2014  . Lower extremity edema 04/25/2014  . Peripheral neuropathy ([place]) 12/10/2013  . Overactive bladder 12/27/2011  . Osteoporosis 12/27/2011  . Vitamin B12 deficiency 05/03/2010  . Iron deficiency anemia 05/03/2010  . Normocytic anemia 05/03/2010  . VENOUS INSUFFICIENCY 01/27/2009  . BACK PAIN, LUMBAR 01/27/2008  . URINARY INCONTINENCE 08/23/2007  . DIVERTICULOSIS OF COLON 04/10/2007  . HYPERCHOLESTEROLEMIA 02/13/2007  . ANXIETY 02/13/2007  . Essential hypertension 02/13/2007  . HIATAL HERNIA 02/13/2007  . Irritable bowel syndrome 02/13/2007  . DEGENERATIVE JOINT DISEASE 02/13/2007  . Allergic rhinitis 02/13/2007    Orientation RESPIRATION BLADDER Height & Weight     Self, Situation, Place  Normal Indwelling catheter Weight: 55 kg Height:  4\' 11"  (149.9 cm)  BEHAVIORAL SYMPTOMS/MOOD NEUROLOGICAL BOWEL NUTRITION STATUS  (none) (none) Continent Diet(see d/c summary)  AMBULATORY STATUS COMMUNICATION OF NEEDS Skin   Extensive Assist Verbally Surgical wounds(L Hip)                       Personal Care Assistance Level of Assistance  Bathing, Feeding, Dressing Bathing Assistance: Maximum assistance Feeding assistance: Independent Dressing Assistance: Limited assistance     Functional Limitations Info  Sight, Hearing, Speech Sight Info: Adequate Hearing Info: Adequate Speech Info: Adequate    SPECIAL CARE FACTORS FREQUENCY  PT (By licensed PT), OT (By licensed OT)     PT Frequency: 5X/W OT Frequency: 5X/W            Contractures Contractures Info: Not present    Additional Factors Info  Code Status, Allergies Code Status Info: DNR Allergies  Info: Amoxicillin-pot Clavulanate, Sulfonamide Derivatives           Current Medications (07/02/2019):  This is the current hospital active medication list Current Facility-Administered Medications  Medication Dose Route Frequency Provider Last Rate Last Admin  . acetaminophen (TYLENOL) tablet 325-650 mg  325-650 mg Oral Q6H PRN Mcarthur Rossetti, MD      . acetaminophen (TYLENOL) tablet 650 mg  650 mg Oral Q8H Mcarthur Rossetti, MD   650 mg at 07/02/19 [id number]  . amiodarone (PACERONE) tablet 200 mg  200 mg Oral Daily Mcarthur Rossetti, MD   200 mg at 07/02/19 0934  . apixaban (ELIQUIS) tablet 2.5 mg  2.5 mg Oral BID Mcarthur Rossetti, MD   2.5 mg at 07/02/19 0932  . atorvastatin (LIPITOR) tablet 20 mg  20 mg Oral Daily Mcarthur Rossetti, MD   20 mg at 07/02/19 0931  . Chlorhexidine Gluconate Cloth 2 % PADS 6 each  6 each Topical Daily Mujtaba, Mohammadtokir, MD   6 each at 07/02/19 0939  . cholecalciferol (VITAMIN D3) tablet 1,000 Units  1,000 Units Oral Daily Mcarthur Rossetti, MD   1,000 Units at 07/02/19 0931  . darifenacin (ENABLEX) 24 hr tablet 15 mg  15 mg Oral Daily Mcarthur Rossetti, MD   15 mg at 07/02/19 0934  . diclofenac Sodium (VOLTAREN) 1 % topical gel 2 g  2 g Topical QID PRN Mcarthur Rossetti, MD   2 g at 06/30/19 1443  . docusate sodium (COLACE) capsule 100 mg  100 mg Oral BID Mcarthur Rossetti, MD   100 mg at 07/02/19 0931  . isosorbide mononitrate (IMDUR) 24 hr tablet 60 mg  60 mg Oral Daily Mcarthur Rossetti, MD   60 mg at 07/02/19 0932  . levothyroxine (SYNTHROID) tablet 62.5 mcg  62.5 mcg Oral Q0600 Mcarthur Rossetti, MD   62.5 mcg at 07/02/19 [id number]  . lidocaine (LIDODERM) 5 % 1 patch  1 patch Transdermal Q24H Mcarthur Rossetti, MD   1 patch at 07/01/19 1750  . menthol-cetylpyridinium (CEPACOL) lozenge 3 mg  1 lozenge Oral PRN Mcarthur Rossetti, MD       Or  . phenol (CHLORASEPTIC) mouth spray 1  spray  1 spray Mouth/Throat PRN Mcarthur Rossetti, MD      . metoCLOPramide (REGLAN) tablet 5-10 mg  5-10 mg Oral Q8H PRN Mcarthur Rossetti, MD       Or  . metoCLOPramide (REGLAN) injection 5-10 mg  5-10 mg Intravenous Q8H PRN Mcarthur Rossetti, MD      . mupirocin ointment (BACTROBAN) 2 % 1 application  1 application Nasal BID Mcarthur Rossetti, MD   1 application at [id number] [phone]  . ondansetron (ZOFRAN) tablet 4 mg  4 mg Oral Q6H PRN Mcarthur Rossetti, MD       Or  . ondansetron [facility]) injection 4 mg  4 mg Intravenous Q6H PRN Mcarthur Rossetti, MD      . pantoprazole (PROTONIX) EC tablet 40 mg  40 mg Oral Daily Mcarthur Rossetti, MD   40 mg at 07/02/19 0932  . polyethylene glycol (MIRALAX / GLYCOLAX) packet 17 g  17 g Oral  Daily Mcarthur Rossetti, MD   17 g at 07/02/19 [id number]  . prochlorperazine (COMPAZINE) injection 10 mg  10 mg Intravenous Q6H PRN Mcarthur Rossetti, MD      . senna-docusate (Senokot-S) tablet 2 tablet  2 tablet Oral BID Mcarthur Rossetti, MD   2 tablet at 07/02/19 0931  . vitamin B-12 (CYANOCOBALAMIN) tablet 1,000 mcg  1,000 mcg Oral Daily Mcarthur Rossetti, MD   1,000 mcg at 07/02/19 0932     Discharge Medications: Please see discharge summary for a list of discharge medications.  Relevant Imaging Results:  Relevant Lab Results:   Additional Information SSN: [id number]  Trish Mage, LCSW

## 2019-07-02 NOTE — TOC Initial Note (Addendum)
Transition of Care [facility]) - Initial/Assessment Note    Patient Details  Name: ROSMARY DIONISIO MRN: [id number] Date of Birth: 1928/01/21  Transition of [facility]) CM/SW Contact:    Trish Mage, LCSW Phone Number: 07/02/2019, 10:20 AM  Clinical Narrative:    Met with patient in response to PT recommendation of SNF for rehab.  Ms Burrowes lives alone in [place], cites support of her daughter and daughter-in-law, and is reluctantly agreeable to SNF referral as she knows she is unable to manage by herself at home,  States she was previously at [facility] and had a good experience, but recently her neighbor was there and had a bad experience, so she has taken them off the list of desirable places.  No other preferences identified.  Bed search initiated, as well as insurance authorization.  PASSR secured. TOC will continue to follow during the course of hospitalization.  Followed up with patient about bed offers.  She was initially interested in [facility], but then said she knew her daughter would not come there to visit, and wondered about other options. Together we called daughter Dalbert Mayotte "Cookie" who in fact stated [facility] was too far.  I went over options and she stated she is not far from [facility], and would see about taking a tour of that facility later today and letting me know if that is an option.  Ms Lafavor was satisfied with this plan.  Daughter called back to confirm choice of [facility].  I let facility know.              Expected Discharge Plan: Skilled Nursing Facility Barriers to Discharge: SNF Pending bed offer   Patient Goals and CMS Choice        Expected Discharge Plan and Services Expected Discharge Plan: [place]                                              Prior Living Arrangements/Services                       Activities of Daily Living Home Assistive Devices/Equipment: Environmental consultant (specify type), Eyeglasses ADL Screening  (condition at time of admission) Patient's cognitive ability adequate to safely complete daily activities?: Yes Is the patient deaf or have difficulty hearing?: No Does the patient have difficulty seeing, even when wearing glasses/contacts?: No Does the patient have difficulty concentrating, remembering, or making decisions?: No Patient able to express need for assistance with ADLs?: Yes Does the patient have difficulty dressing or bathing?: No Independently performs ADLs?: No Communication: Independent Dressing (OT): Needs assistance Is this a change from baseline?: Change from baseline, expected to last >3 days Grooming: Independent Feeding: Independent Bathing: Needs assistance Is this a change from baseline?: Change from baseline, expected to last >3 days Toileting: Needs assistance Is this a change from baseline?: Change from baseline, expected to last >3days In/Out Bed: Needs assistance Is this a change from baseline?: Change from baseline, expected to last >3 days Walks in Home: Needs assistance Is this a change from baseline?: Change from baseline, expected to last >3 days Does the patient have difficulty walking or climbing stairs?: Yes Weakness of Legs: Left Weakness of Arms/Hands: None  Permission Sought/Granted                  Emotional Assessment  Admission diagnosis:  Fracture of femoral neck, left, closed ([place]) [S72.002A] Closed fracture of neck of left femur, initial encounter ([place]) [S72.002A] Hip fracture ([place]) [S72.009A] Patient Active Problem List   Diagnosis Date Noted  . Hip fracture ([place]) 06/29/2019  . Fracture of femoral neck, left, closed ([place]) 06/28/2019  . GERD without esophagitis 06/28/2019  . Noninfected skin tear of leg, right, initial encounter 04/19/2018  . Bowel obstruction ([place]) 02/21/2018  . Hypothyroidism 02/21/2018  . Osteoarthritis of left shoulder 08/10/2017  . Pain in joint of left shoulder 08/10/2017  . Mass of  joint of left wrist 06/22/2017  . Arthritis of left wrist 06/21/2017  . Carpal tunnel syndrome 06/21/2017  . Tenosynovitis of left wrist 06/21/2017  . Fracture of distal end of humerus 03/25/2017  . Closed supracondylar fracture of left humerus 01/15/2017  . Gait disorder 01/15/2017  . Recurrent falls 01/15/2017  . Rib fractures 11/28/2016  . Skin lesion 09/30/2016  . Ganglion cyst of dorsum of left wrist 09/30/2016  . Rotator cuff arthropathy, right 04/26/2016  . Hyperglycemia 04/01/2016  . Right arm pain 04/01/2016  . Wrist swelling 09/30/2015  . Preventative health care 04/02/2015  . Chronic combined systolic and diastolic CHF (congestive heart failure) ([place]) 03/17/2015  . Coronary artery calcification seen on CT scan 03/17/2015  . Sinus bradycardia 03/17/2015  . Splenic lesion 02/18/2015  . Liver lesion 02/18/2015  . CAD (coronary artery disease), native coronary artery 02/03/2015  . Atrial fibrillation and flutter ([place]) 02/02/2015  . Fatigue 01/31/2015  . Abnormal CT of the abdomen 01/31/2015  . Prolonged Q-T interval on ECG 01/31/2015  . Hepatic lesion   . Constipation 12/03/2014  . Lower extremity edema 04/25/2014  . Peripheral neuropathy ([place]) 12/10/2013  . Overactive bladder 12/27/2011  . Osteoporosis 12/27/2011  . Vitamin B12 deficiency 05/03/2010  . Iron deficiency anemia 05/03/2010  . Normocytic anemia 05/03/2010  . VENOUS INSUFFICIENCY 01/27/2009  . BACK PAIN, LUMBAR 01/27/2008  . URINARY INCONTINENCE 08/23/2007  . DIVERTICULOSIS OF COLON 04/10/2007  . HYPERCHOLESTEROLEMIA 02/13/2007  . ANXIETY 02/13/2007  . Essential hypertension 02/13/2007  . HIATAL HERNIA 02/13/2007  . Irritable bowel syndrome 02/13/2007  . DEGENERATIVE JOINT DISEASE 02/13/2007  . Allergic rhinitis 02/13/2007   PCP:  Biagio Borg, MD Pharmacy:   [place], [place] [address] [place] [address] [place] [address] Phone: [phone] Fax:  [address], [place] [address] [place] [address] [place] [address] Phone: [phone] Fax: [phone]  [facility] [address] to [address] [place] [address] Phone: [phone] Fax: [phone]  [id number] [id number]: [phone]: [phone]    Social Determinants of Health (SDOH) Interventions    Readmission Risk Interventions No flowsheet data found.

## 2019-07-03 LAB — CBC
HCT: 34 % — ABNORMAL LOW (ref 36.0–46.0)
Hemoglobin: 11 g/dL — ABNORMAL LOW (ref 12.0–15.0)
MCH: 30.2 pg (ref 26.0–34.0)
MCHC: 32.4 g/dL (ref 30.0–36.0)
MCV: 93.4 fL (ref 80.0–100.0)
Platelets: 152 10*3/uL (ref 150–400)
RBC: 3.64 MIL/uL — ABNORMAL LOW (ref 3.87–5.11)
RDW: 14.6 % (ref 11.5–15.5)
WBC: 6.7 10*3/uL (ref 4.0–10.5)
nRBC: 0 % (ref 0.0–0.2)

## 2019-07-03 LAB — BASIC METABOLIC PANEL
Anion gap: 7 (ref 5–15)
BUN: 16 mg/dL (ref 8–23)
CO2: 26 mmol/L (ref 22–32)
Calcium: 8.7 mg/dL — ABNORMAL LOW (ref 8.9–10.3)
Chloride: 103 mmol/L (ref 98–111)
Creatinine, Ser: 0.83 mg/dL (ref 0.44–1.00)
GFR calc Af Amer: >60 mL/min (ref >60)
GFR calc non Af Amer: >60 mL/min (ref >60)
Glucose, Bld: 92 mg/dL (ref 70–99)
Potassium: 4.5 mmol/L (ref 3.5–5.1)
Sodium: 136 mmol/L (ref 135–145)

## 2019-07-03 LAB — RESPIRATORY PANEL BY RT PCR (FLU A&B, COVID)
Influenza A by PCR: NEGATIVE
Influenza B by PCR: NEGATIVE
SARS Coronavirus 2 by RT PCR: NEGATIVE

## 2019-07-03 MED ORDER — SODIUM CHLORIDE 0.9% FLUSH
3.0000 mL | Freq: Two times a day (BID) | INTRAVENOUS | Status: DC
  Administered 2019-07-03: 3 mL via INTRAVENOUS

## 2019-07-03 NOTE — Progress Notes (Signed)
Patient ID: Cathy [name], female   DOB: January 20, 1928, 86 y.o.   MRN: [id number] Left hip dressing changed at the bedside this am.  Her incision is clean, dry, and intact.  New dressing applied.  Left hip is stable.  Can be discharged to short-term skilled nursing from Ortho standpoint.

## 2019-07-03 NOTE — TOC Transition Note (Signed)
Transition of Care [facility]) - CM/SW Discharge Note   Patient Details  Name: Cathy [name] MRN: [id number] Date of Birth: 1927/03/19  Transition of Care [facility]) CM/SW Contact:  Trish Mage, LCSW Phone Number: 07/03/2019, 1:10 PM   Clinical Narrative: Patient top d/c today.  PTAR arranged. Nursing, please call report to [phone], Room  504. TOC sign off.    Final next level of care: Skilled Nursing Facility Barriers to Discharge: Barriers Resolved   Patient Goals and CMS Choice        Discharge Placement                       Discharge Plan and Services                                     Social Determinants of Health (SDOH) Interventions     Readmission Risk Interventions No flowsheet data found.

## 2019-07-03 NOTE — Discharge Instructions (Signed)
Should only attempt up to 50% weight bearing on left hip. Can get hip incision wet in the shower daily. Current hip dressing can get wet and can stay on until her ortho follow-up
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
## 2019-07-03 NOTE — Progress Notes (Signed)
Called report to SNF, all questions and concerns addressed.  Copy of AVS put in discharge packet.   Transport has been called to take patient.  Family has been notified.

## 2019-07-03 NOTE — Care Management Important Message (Signed)
Important Message  Patient Details  IM Letter given to [facility] SW to present to the Patient Name: AVARY BESSIRE MRN: [id number] Date of Birth: [birth year]   Medicare Important Message Given:  Yes     Kerin Salen 07/03/2019, 12:22 PM

## 2019-07-03 NOTE — TOC Progression Note (Signed)
Transition of Care [facility]) - Progression Note    Patient Details  Name: Cathy [name] MRN: [id number] Date of Birth: 07-20-1927  Transition of Care [facility]) CM/SW [place], [place] Phone Number: 07/03/2019, 12:04 PM  Clinical Narrative:   Insurance authorization received. Navi ID [id number] days May 4-6.  Horace Porteous is reviewer.  FAX # [id number]. TOC will continue to follow during the course of hospitalization.     Expected Discharge Plan: Skilled Nursing Facility Barriers to Discharge: Barriers Resolved  Expected Discharge Plan and Services Expected Discharge Plan: [place]                                               Social Determinants of Health (SDOH) Interventions    Readmission Risk Interventions No flowsheet data found.

## 2019-07-04 ENCOUNTER — Telehealth: Payer: Self-pay | Admitting: *Deleted

## 2019-07-04 ENCOUNTER — Non-Acute Institutional Stay (SKILLED_NURSING_FACILITY): Payer: Medicare Other | Admitting: Internal Medicine

## 2019-07-04 ENCOUNTER — Encounter: Payer: Self-pay | Admitting: Internal Medicine

## 2019-07-04 ENCOUNTER — Telehealth: Payer: Self-pay

## 2019-07-04 DIAGNOSIS — I5042 Chronic combined systolic (congestive) and diastolic (congestive) heart failure: Secondary | ICD-10-CM

## 2019-07-04 DIAGNOSIS — I4891 Unspecified atrial fibrillation: Secondary | ICD-10-CM

## 2019-07-04 DIAGNOSIS — I2510 Atherosclerotic heart disease of native coronary artery without angina pectoris: Secondary | ICD-10-CM | POA: Diagnosis not present

## 2019-07-04 DIAGNOSIS — E039 Hypothyroidism, unspecified: Secondary | ICD-10-CM

## 2019-07-04 DIAGNOSIS — S72002D Fracture of unspecified part of neck of left femur, subsequent encounter for closed fracture with routine healing: Secondary | ICD-10-CM | POA: Diagnosis not present

## 2019-07-04 DIAGNOSIS — I4892 Unspecified atrial flutter: Secondary | ICD-10-CM

## 2019-07-04 DIAGNOSIS — D649 Anemia, unspecified: Secondary | ICD-10-CM

## 2019-07-04 NOTE — Telephone Encounter (Signed)
Myriam Jacobson with [facility] would like to clarify wound care orders for patient?  Stated that patient has Aquacel dressing and wants to know if that should stay in place?  Patient had LT Hip surgery on Saturday, 06/30/2019 with Dr. Ninfa Linden.  Cb# [phone].  Please advise.  Thank you.

## 2019-07-04 NOTE — Telephone Encounter (Signed)
Did you give one to replace after a week?

## 2019-07-04 NOTE — Telephone Encounter (Signed)
Pt was on TCM report admitted 06/29/19 after traumatic fall resulting in nondisplaced left femoral neck fracture. Post left hip pinning. She will need to be nonweightbearing for at least 6 weeks. Meanwhile PT can work with non-weightbearing transfers. Pt D/C 07/03/19 to SNF. Will follow-up w/ Dr. Rush Farmer once she is d/c from SNF.Marland KitchenJohny Chess

## 2019-07-04 NOTE — Telephone Encounter (Signed)
That dressing can stay on until her follow-up appointment with [place] and can get wet.  However, if nurses at her facility feel that it needs to be changed, they can change it as needed without any problems at all with just any dry dressing.

## 2019-07-04 NOTE — Telephone Encounter (Signed)
Cathy [name] aware of the below message
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
## 2019-07-04 NOTE — Progress Notes (Deleted)
Location:    [facility] Room Number: [id number] Place of Service:  SNF (31)   CODE STATUS: DNR  Allergies  Allergen Reactions  . Amoxicillin-Pot Clavulanate Diarrhea    Has patient had a PCN reaction causing immediate rash, facial/tongue/throat swelling, SOB or lightheadedness with hypotension: no Has patient had a PCN reaction causing severe rash involving mucus membranes or skin necrosis:  no Has patient had a PCN reaction that required hospitalization: no Has patient had a PCN reaction occurring within the last 10 years no If all of the above answers are "NO", then may proceed with Cephalosporin use.   . Sulfonamide Derivatives Other (See Comments)    unknown    Chief Complaint  Patient presents with  . Hospitalization Follow-up    Hospitalization Follow Up    HPI:    Past Medical History:  Diagnosis Date  . Allergic rhinitis   . Anemia   . Anxiety   . Chronic systolic CHF (congestive heart failure) ([place])    a. 2D Echo 02/02/15: EF 25-30%, akinesis of mid-apical anteroseptal myocardium, grade 3 DD, mod MR, severely dilated LA, mildly reduced RV systoluc function, mod dilated RA, mod TR, mildly increased PASP 32mmHg. b. 10/2016: EF 55-60% with no regional WMA. Grade 3 DD.   [name] CLOSTRIDIUM DIFFICILE COLITIS 05/26/2009   Qualifier: Diagnosis of  By: Nils Pyle CMA ([place]), Mearl Latin    . Colitis    lymphocytic colitis feb 2011  . Coronary artery calcification    a. By CT 01/2015.  . Diverticulosis of colon   . DJD (degenerative joint disease)    gets epidural injections  . Dysphagia   . Hiatal hernia   . Hypercholesteremia   . Hypertension   . Iron deficiency   . Irritable bowel syndrome   . Liver lesion 02/18/2015  . Lumbar back pain   . Osteoporosis   . Paroxysmal atrial flutter ([place])    a. Dx 01/2015 - underwent DCCV 03/2015; takes Eliquis  . Sinus bradycardia   . Splenic lesion 02/18/2015  . Urinary incontinence   . Venous insufficiency   .  Vitamin B12 deficiency     Past Surgical History:  Procedure Laterality Date  . ABDOMINAL HYSTERECTOMY    . CARDIOVERSION N/A 02/03/2015   Procedure: CARDIOVERSION;  Surgeon: Sueanne Margarita, MD;  Location: [facility] ENDOSCOPY;  Service: Cardiovascular;  Laterality: N/A;  . CARDIOVERSION N/A 03/10/2015   Procedure: CARDIOVERSION;  Surgeon: Dorothy Spark, MD;  Location: [facility];  Service: Cardiovascular;  Laterality: N/A;  . CARDIOVERSION N/A 01/07/2017   Procedure: CARDIOVERSION;  Surgeon: Pixie Casino, MD;  Location: [facility] ENDOSCOPY;  Service: Cardiovascular;  Laterality: N/A;  . cataract surgery    . decompressive lumbar laminectomy  06/2008   at L2-3, L3-4 and L4-5 by Dr. Ronnald Ramp  . HIP PINNING,CANNULATED Left 06/30/2019   Procedure: CANNULATED LEFT HIP PINNING;  Surgeon: Mcarthur Rossetti, MD;  Location: [facility] ORS;  Service: Orthopedics;  Laterality: Left;  . LAPAROSCOPIC CHOLECYSTECTOMY  04/2009   Dr. Abran Cantor  . REPLACEMENT TOTAL KNEE    . TEE WITHOUT CARDIOVERSION N/A 02/03/2015   Procedure: TRANSESOPHAGEAL ECHOCARDIOGRAM (TEE);  Surgeon: Sueanne Margarita, MD;  Location: [facility] ENDOSCOPY;  Service: Cardiovascular;  Laterality: N/A;    Social History   Socioeconomic History  . Marital status: Widowed    Spouse name: Not on file  . Number of children: Not on file  . Years of education: Not on file  . Highest education level:  Not on file  Occupational History  . Occupation: retired  Tobacco Use  . Smoking status: Former Smoker    Packs/day: 0.50    Years: 10.00    Pack years: 5.00    Types: Cigarettes  . Smokeless tobacco: Never Used  Substance and Sexual Activity  . Alcohol use: No  . Drug use: Never  . Sexual activity: Not on file  Other Topics Concern  . Not on file  Social History Narrative  . Not on file   Social Determinants of Health   Financial Resource Strain:   . Difficulty of Paying Living Expenses:   Food Insecurity:   . Worried About Charity fundraiser in  the Last Year:   . Arboriculturist in the Last Year:   Transportation Needs:   . Film/video editor (Medical):   [name] Lack of Transportation (Non-Medical):   Physical Activity:   . Days of Exercise per Week:   . Minutes of Exercise per Session:   Stress:   . Feeling of Stress :   Social Connections:   . Frequency of Communication with Friends and Family:   . Frequency of Social Gatherings with Friends and Family:   . Attends Religious Services:   . Active Member of Clubs or Organizations:   . Attends Archivist Meetings:   [name] Marital Status:   Intimate Partner Violence:   . Fear of Current or Ex-Partner:   . Emotionally Abused:   [name] Physically Abused:   . Sexually Abused:    Family History  Problem Relation Age of Onset  . Cancer Brother        lung  . Diabetes Son   . Coronary artery disease Son   . Kidney disease Son   . Stroke Son   . Heart attack Mother   . Cancer Brother        colon  . Cancer Daughter        cervical, lung   . Heart attack Brother   . Diabetes Brother   . Heart attack Sister   . Hypertension Neg Hx   . Fainting Neg Hx       VITAL SIGNS BP (!) 157/91   Pulse 72   Temp (!) 96.9 F (36.1 C) (Oral)   Resp 18   Ht 4\' 11"  (1.499 m)   Wt 108 lb 9.6 oz (49.3 kg)   SpO2 98%   BMI 21.93 kg/m   Outpatient Encounter Medications as of 07/04/2019  Medication Sig  . acetaminophen (TYLENOL) 325 MG tablet Take 650 mg by mouth every 8 (eight) hours.  [name] amiodarone (PACERONE) 200 MG tablet Take 1 tablet (200 mg total) by mouth daily. Please keep upcoming appt in June with Dr. Curt Bears before anymore refills. Thank you  . apixaban (ELIQUIS) 2.5 MG TABS tablet Take 1 tablet (2.5 mg total) by mouth 2 (two) times daily.  [name] atorvastatin (LIPITOR) 20 MG tablet TAKE 1 TABLET DAILY AT 6 P.M. Please keep upcoming appt with Dr. Curt Bears in June before anymore refills. Thank you  . calcium-vitamin D (OSCAL WITH D) 500-200 MG-UNIT per tablet Take 1 tablet by  mouth daily.   . cholecalciferol (VITAMIN D) 1000 UNITS tablet Take 1,000 Units by mouth daily.  . diclofenac (FLECTOR) 1.3 % PTCH Place 1 patch onto the skin daily as needed (pain).   [name] esomeprazole (NEXIUM) 40 MG capsule Take 40 mg by mouth daily at 12 noon.  . furosemide (LASIX) 20 MG  tablet Take 20 mg by mouth every other day.  . isosorbide mononitrate (IMDUR) 60 MG 24 hr tablet Take 1 tablet (60 mg total) by mouth daily. Annual appt due in May must see provider for future refills  . levothyroxine (SYNTHROID) 125 MCG tablet Take 0.5 tablets (62.5 mcg total) by mouth daily. Annual appt due in May must see provider for future refills  . polyethylene glycol (MIRALAX / GLYCOLAX) 17 g packet Take 17 g by mouth daily.  [name] senna-docusate (SENOKOT-S) 8.6-50 MG tablet Take 1 tablet by mouth 2 (two) times daily. For Constipation  . solifenacin (VESICARE) 10 MG tablet Take 1 tablet (10 mg total) by mouth daily.  . vitamin B-12 (CYANOCOBALAMIN) 1000 MCG tablet Take 1 tablet (1,000 mcg total) by mouth daily.   No facility-administered encounter medications on file as of 07/04/2019.     SIGNIFICANT DIAGNOSTIC EXAMS       ASSESSMENT/ PLAN:    MD is aware of resident's narcotic use and is in agreement with current plan of care. We will attempt to wean resident as appropriate.  Ok Edwards NP [facility]  Contact [phone] Monday through Friday 8am- 5pm  After hours call [phone]

## 2019-07-05 ENCOUNTER — Other Ambulatory Visit: Payer: Self-pay | Admitting: Internal Medicine

## 2019-07-05 NOTE — Telephone Encounter (Signed)
Please refill as per office routine med refill policy (all routine meds refilled for 3 mo or monthly per pt preference up to one year from last visit, then month to month grace period for 3 mo, then further med refills will have to be denied)  

## 2019-07-06 ENCOUNTER — Encounter: Payer: Self-pay | Admitting: Internal Medicine

## 2019-07-06 ENCOUNTER — Non-Acute Institutional Stay (SKILLED_NURSING_FACILITY): Payer: Medicare Other | Admitting: Internal Medicine

## 2019-07-06 DIAGNOSIS — G3184 Mild cognitive impairment, so stated: Secondary | ICD-10-CM

## 2019-07-06 DIAGNOSIS — I4892 Unspecified atrial flutter: Secondary | ICD-10-CM

## 2019-07-06 DIAGNOSIS — E039 Hypothyroidism, unspecified: Secondary | ICD-10-CM

## 2019-07-06 DIAGNOSIS — N3281 Overactive bladder: Secondary | ICD-10-CM

## 2019-07-06 DIAGNOSIS — M810 Age-related osteoporosis without current pathological fracture: Secondary | ICD-10-CM

## 2019-07-06 DIAGNOSIS — I4891 Unspecified atrial fibrillation: Secondary | ICD-10-CM

## 2019-07-06 DIAGNOSIS — I2510 Atherosclerotic heart disease of native coronary artery without angina pectoris: Secondary | ICD-10-CM | POA: Diagnosis not present

## 2019-07-06 DIAGNOSIS — K5909 Other constipation: Secondary | ICD-10-CM

## 2019-07-06 DIAGNOSIS — S72002A Fracture of unspecified part of neck of left femur, initial encounter for closed fracture: Secondary | ICD-10-CM | POA: Diagnosis not present

## 2019-07-06 DIAGNOSIS — M19042 Primary osteoarthritis, left hand: Secondary | ICD-10-CM

## 2019-07-06 DIAGNOSIS — E538 Deficiency of other specified B group vitamins: Secondary | ICD-10-CM

## 2019-07-06 DIAGNOSIS — M19041 Primary osteoarthritis, right hand: Secondary | ICD-10-CM | POA: Insufficient documentation

## 2019-07-06 DIAGNOSIS — D649 Anemia, unspecified: Secondary | ICD-10-CM

## 2019-07-06 DIAGNOSIS — I5042 Chronic combined systolic (congestive) and diastolic (congestive) heart failure: Secondary | ICD-10-CM
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
## 2019-07-11 ENCOUNTER — Ambulatory Visit (INDEPENDENT_AMBULATORY_CARE_PROVIDER_SITE_OTHER): Payer: Medicare Other

## 2019-07-11 ENCOUNTER — Other Ambulatory Visit: Payer: Self-pay

## 2019-07-11 ENCOUNTER — Ambulatory Visit (INDEPENDENT_AMBULATORY_CARE_PROVIDER_SITE_OTHER): Payer: Medicare Other | Admitting: Orthopaedic Surgery

## 2019-07-11 ENCOUNTER — Encounter: Payer: Medicare Other | Admitting: Internal Medicine

## 2019-07-11 ENCOUNTER — Encounter: Payer: Self-pay | Admitting: Orthopaedic Surgery

## 2019-07-11 DIAGNOSIS — S72002A Fracture of unspecified part of neck of left femur, initial encounter for closed fracture: Secondary | ICD-10-CM

## 2019-07-11 DIAGNOSIS — Z9889 Other specified postprocedural states: Secondary | ICD-10-CM

## 2019-07-11 NOTE — Progress Notes (Signed)
The patient is a 86 year old female who is 11 days status post cannulated screw fixation of a minimally displaced left hip femoral neck fracture.  We have had her at only touchdown weightbearing.  She staying in a nursing facility.  On exam she tolerates internal and external rotation of the hip with mild pain.  She has been hesitant to put weight on her hip and I agree with this based on her fracture.  It is too early to remove her staples today.  I did share with her x-rays of her left hip.  There is intact screw fixation of a minimally displaced femoral neck fracture.  The hip joint itself is well-maintained on the left side.  I will let the facility remove the staples on 07/17/2019.  We will see her back in 4 weeks from now with an AP and lateral of the left hip only.  We do not need to see the pelvis.  At that visit hopefully will advance her to full weightbearing.  All questions and concerns were answered and addressed.

## 2019-07-23 ENCOUNTER — Non-Acute Institutional Stay (SKILLED_NURSING_FACILITY): Payer: Medicare Other | Admitting: Internal Medicine

## 2019-07-23 ENCOUNTER — Encounter: Payer: Self-pay | Admitting: Internal Medicine

## 2019-07-23 DIAGNOSIS — I4892 Unspecified atrial flutter: Secondary | ICD-10-CM | POA: Diagnosis not present

## 2019-07-23 DIAGNOSIS — I2510 Atherosclerotic heart disease of native coronary artery without angina pectoris: Secondary | ICD-10-CM

## 2019-07-23 DIAGNOSIS — S72002D Fracture of unspecified part of neck of left femur, subsequent encounter for closed fracture with routine healing: Secondary | ICD-10-CM

## 2019-07-23 DIAGNOSIS — K219 Gastro-esophageal reflux disease without esophagitis: Secondary | ICD-10-CM

## 2019-07-23 DIAGNOSIS — I5042 Chronic combined systolic (congestive) and diastolic (congestive) heart failure: Secondary | ICD-10-CM

## 2019-07-23 MED ORDER — AMIODARONE HCL 200 MG PO TABS: 200.0000 mg | ORAL_TABLET | Freq: Every day | ORAL | 0 refills | Status: DC

## 2019-07-23 MED ORDER — LEVOTHYROXINE SODIUM 125 MCG PO TABS: 62.5000 ug | ORAL_TABLET | Freq: Every day | ORAL | 0 refills | Status: DC

## 2019-07-23 MED ORDER — SOLIFENACIN SUCCINATE 10 MG PO TABS: 10.0000 mg | ORAL_TABLET | Freq: Every day | ORAL | 0 refills | Status: AC

## 2019-07-23 MED ORDER — ATORVASTATIN CALCIUM 20 MG PO TABS: ORAL_TABLET | ORAL | 0 refills | Status: DC

## 2019-07-23 MED ORDER — ESOMEPRAZOLE MAGNESIUM 40 MG PO CPDR: 40.0000 mg | DELAYED_RELEASE_CAPSULE | Freq: Every day | ORAL | 0 refills | Status: AC

## 2019-07-23 MED ORDER — APIXABAN 2.5 MG PO TABS: 2.5000 mg | ORAL_TABLET | Freq: Two times a day (BID) | ORAL | 0 refills | Status: DC

## 2019-07-23 MED ORDER — ISOSORBIDE MONONITRATE ER 60 MG PO TB24: ORAL_TABLET | ORAL | 0 refills | Status: DC

## 2019-07-23 MED ORDER — FUROSEMIDE 20 MG PO TABS: 20.0000 mg | ORAL_TABLET | ORAL | 0 refills | Status: DC

## 2019-07-23 MED ORDER — DICLOFENAC EPOLAMINE 1.3 % EX PTCH: 1.0000 | MEDICATED_PATCH | Freq: Every day | CUTANEOUS | 0 refills | Status: DC | PRN

## 2019-07-23 NOTE — Progress Notes (Signed)
Location:    [facility] Room Number: 110/P Place of Service:  SNF [phone])  Provider: Lorne Skeens  PCP: Biagio Borg, MD Patient Care Team: Biagio Borg, MD as PCP - General (Internal Medicine) Constance Haw, MD as PCP - Cardiology (Cardiology) Bo Merino, MD (Rheumatology) Marlaine Hind, MD (Physical Medicine and Rehabilitation) Lafayette Dragon, MD (Inactive) (Gastroenterology) Carolan Clines, MD (Inactive) (Urology) Clent Jacks, MD (Ophthalmology) Belva Crome, MD as Consulting Physician (Cardiology)  Extended Emergency Contact Information Primary Emergency Contact: Haydee Monica, [place] Johnnette Litter of [place] Phone: [phone] Work Phone: [phone] Mobile Phone: [phone] Relation: Daughter Secondary Emergency Contact: Shatina, [place] Mobile Phone: [phone] Relation: Relative Preferred language: English  Code Status: DNR Goals of care:  Advanced Directive information Advanced Directives 07/23/2019  Does Patient Have a Medical Advance Directive? Yes  Type of Advance Directive Out of facility DNR (pink MOST or yellow form)  Does patient want to make changes to medical advance directive? No - Patient declined  Copy of [place] in Chart? -  Would patient like information on creating a medical advance directive? -  Pre-existing out of facility DNR order (yellow form or pink MOST form) Yellow form placed in chart (order not valid for inpatient use)     Allergies  Allergen Reactions  . Amoxicillin-Pot Clavulanate Diarrhea    Has patient had a PCN reaction causing immediate rash, facial/tongue/throat swelling, SOB or lightheadedness with hypotension: no Has patient had a PCN reaction causing severe rash involving mucus membranes or skin necrosis:  no Has patient had a PCN reaction that required hospitalization: no Has patient had a PCN reaction occurring within the  last 10 years no If all of the above answers are "NO", then may proceed with Cephalosporin use.   . Sulfonamide Derivatives Other (See Comments)    unknown    Chief Complaint  Patient presents with  . Discharge Note    Discharge Visit    HPI:  86 y.o. female seen today for discharge slated for tomorrow Jul 24, 2019.  She will be going home-she is here for rehab after sustaining a left femur fracture after a fall in the parking lot.  She was repaired with a left hip cannulated screw fixation on Jun 30, 2019 by Dr. Jean Rosenthal.  She also has a history of dysphagia hypothyroidism combined CHF GERD with esophagitis osteoarthritis atrial fibrillation thrombocytopenia B12 deficiency anemia.  She has done well with therapy-she is able to stand and use her walker.  Her pain appears to be controlled she does have orders for Tylenol.  She will have follow-up by orthopedics.  Her other medical issues appear to be stable she does have a history of atrial fibrillation she is on amiodarone 200 mg a day as well as Eliquis 2.5 mg twice daily for anticoagulation.  Regards to CHF she is on 20 mg of Lasix every other day-she has pretty minimal edema bit more on the surgical side.  She is not complaining of any increased shortness of breath or chest pain.  Regards to hypothyroidism she is on Synthroid 62.5 mcg a day TSH was 4.34 in the hospital this will warrant follow-up by primary care provider.  Regards to anemia this appears to be mild her hemoglobins have been fairly consistently in the high nines-11 range it was 10.1 on lab done on May 17.  She does have an order  for Vesicare with a history of overactive bladder-she also at one point was on Pyridium but she says she does not need this anymore-does not complain of really urinary irritation or dysuria-at one point she did we did order a urine culture but it did not show greater than 100,000 colonies of any organism-it only showed 60,000  colonies of Proteus and 40,000 colonies of E. coli and she appears to be asymptomatic at this point without any complaints of back pain dysuria fever or chills  When she goes home she will need PT and OT with nursing support    Past Medical History:  Diagnosis Date  . Allergic rhinitis   . Anemia   . Anxiety   . Chronic systolic CHF (congestive heart failure) ([place])    a. 2D Echo 02/02/15: EF 25-30%, akinesis of mid-apical anteroseptal myocardium, grade 3 DD, mod MR, severely dilated LA, mildly reduced RV systoluc function, mod dilated RA, mod TR, mildly increased PASP 39mmHg. b. 10/2016: EF 55-60% with no regional WMA. Grade 3 DD.   [name] CLOSTRIDIUM DIFFICILE COLITIS 05/26/2009   Qualifier: Diagnosis of  By: Nils Pyle CMA ([facility]), Mearl Latin    . Colitis    lymphocytic colitis feb 2011  . Coronary artery calcification    a. By CT 01/2015.  . Diverticulosis of colon   . DJD (degenerative joint disease)    gets epidural injections  . Dysphagia   . Hiatal hernia   . Hypercholesteremia   . Hypertension   . Iron deficiency   . Irritable bowel syndrome   . Liver lesion 02/18/2015  . Lumbar back pain   . Osteoporosis   . Paroxysmal atrial flutter ([place])    a. Dx 01/2015 - underwent DCCV 03/2015; takes Eliquis  . Sinus bradycardia   . Splenic lesion 02/18/2015  . Urinary incontinence   . Venous insufficiency   . Vitamin B12 deficiency     Past Surgical History:  Procedure Laterality Date  . ABDOMINAL HYSTERECTOMY    . CARDIOVERSION N/A 02/03/2015   Procedure: CARDIOVERSION;  Surgeon: Sueanne Margarita, MD;  Location: [facility] ENDOSCOPY;  Service: Cardiovascular;  Laterality: N/A;  . CARDIOVERSION N/A 03/10/2015   Procedure: CARDIOVERSION;  Surgeon: Dorothy Spark, MD;  Location: [facility];  Service: Cardiovascular;  Laterality: N/A;  . CARDIOVERSION N/A 01/07/2017   Procedure: CARDIOVERSION;  Surgeon: Pixie Casino, MD;  Location: [facility] ENDOSCOPY;  Service: Cardiovascular;  Laterality: N/A;  .  cataract surgery    . decompressive lumbar laminectomy  06/2008   at L2-3, L3-4 and L4-5 by Dr. Ronnald Ramp  . HIP PINNING,CANNULATED Left 06/30/2019   Procedure: CANNULATED LEFT HIP PINNING;  Surgeon: Mcarthur Rossetti, MD;  Location: [facility] ORS;  Service: Orthopedics;  Laterality: Left;  . LAPAROSCOPIC CHOLECYSTECTOMY  04/2009   Dr. Abran Cantor  . REPLACEMENT TOTAL KNEE    . TEE WITHOUT CARDIOVERSION N/A 02/03/2015   Procedure: TRANSESOPHAGEAL ECHOCARDIOGRAM (TEE);  Surgeon: Sueanne Margarita, MD;  Location: [facility] ENDOSCOPY;  Service: Cardiovascular;  Laterality: N/A;      reports that she has quit smoking. Her smoking use included cigarettes. She has a 5.00 pack-year smoking history. She has never used smokeless tobacco. She reports that she does not drink alcohol or use drugs. Social History   Socioeconomic History  . Marital status: Widowed    Spouse name: Not on file  . Number of children: Not on file  . Years of education: Not on file  . Highest education level: Not on file  Occupational  History  . Occupation: retired  Tobacco Use  . Smoking status: Former Smoker    Packs/day: 0.50    Years: 10.00    Pack years: 5.00    Types: Cigarettes  . Smokeless tobacco: Never Used  Substance and Sexual Activity  . Alcohol use: No  . Drug use: Never  . Sexual activity: Not on file  Other Topics Concern  . Not on file  Social History Narrative  . Not on file   Social Determinants of Health   Financial Resource Strain:   . Difficulty of Paying Living Expenses:   Food Insecurity:   . Worried About Charity fundraiser in the Last Year:   . Arboriculturist in the Last Year:   Transportation Needs:   . Film/video editor (Medical):   [name] Lack of Transportation (Non-Medical):   Physical Activity:   . Days of Exercise per Week:   . Minutes of Exercise per Session:   Stress:   . Feeling of Stress :   Social Connections:   . Frequency of Communication with Friends and Family:   . Frequency  of Social Gatherings with Friends and Family:   . Attends Religious Services:   . Active Member of Clubs or Organizations:   . Attends Archivist Meetings:   [name] Marital Status:   Intimate Partner Violence:   . Fear of Current or Ex-Partner:   . Emotionally Abused:   [name] Physically Abused:   . Sexually Abused:    Functional Status Survey:    Allergies  Allergen Reactions  . Amoxicillin-Pot Clavulanate Diarrhea    Has patient had a PCN reaction causing immediate rash, facial/tongue/throat swelling, SOB or lightheadedness with hypotension: no Has patient had a PCN reaction causing severe rash involving mucus membranes or skin necrosis:  no Has patient had a PCN reaction that required hospitalization: no Has patient had a PCN reaction occurring within the last 10 years no If all of the above answers are "NO", then may proceed with Cephalosporin use.   . Sulfonamide Derivatives Other (See Comments)    unknown    Pertinent  Health Maintenance Due  Topic Date Due  . INFLUENZA VACCINE  09/30/2019  . DEXA SCAN  Completed  . PNA vac Low Risk Adult  Completed    Medications: Allergies as of 07/23/2019      Reactions   Amoxicillin-pot Clavulanate Diarrhea   Has patient had a PCN reaction causing immediate rash, facial/tongue/throat swelling, SOB or lightheadedness with hypotension: no Has patient had a PCN reaction causing severe rash involving mucus membranes or skin necrosis:  no Has patient had a PCN reaction that required hospitalization: no Has patient had a PCN reaction occurring within the last 10 years no If all of the above answers are "NO", then may proceed with Cephalosporin use.   Sulfonamide Derivatives Other (See Comments)   unknown      Medication List       Accurate as of Jul 23, 2019  4:48 PM. If you have any questions, ask your nurse or doctor.        acetaminophen 325 MG tablet Commonly known as: TYLENOL Take 650 mg by mouth every 8 (eight)  hours.   amiodarone 200 MG tablet Commonly known as: PACERONE Take 1 tablet (200 mg total) by mouth daily. Please keep upcoming appt in June with Dr. Curt Bears before anymore refills. Thank you   apixaban 2.5 MG Tabs tablet Commonly known as: Eliquis Take 1  tablet (2.5 mg total) by mouth 2 (two) times daily.   atorvastatin 20 MG tablet Commonly known as: LIPITOR TAKE 1 TABLET DAILY AT 6 P.M. Please keep upcoming appt with Dr. Curt Bears in June before anymore refills. Thank you   calcium-vitamin D 500-200 MG-UNIT tablet Commonly known as: OSCAL WITH D Take 1 tablet by mouth daily.   cholecalciferol 1000 units tablet Commonly known as: VITAMIN D Take 1,000 Units by mouth daily.   diclofenac 1.3 % Ptch Commonly known as: FLECTOR Place 1 patch onto the skin daily as needed (pain).   esomeprazole 40 MG capsule Commonly known as: NEXIUM Take 40 mg by mouth daily at 12 noon.   furosemide 20 MG tablet Commonly known as: LASIX Take 20 mg by mouth every other day.   isosorbide mononitrate 60 MG 24 hr tablet Commonly known as: IMDUR TAKE 1 TABLET (60 MG TOTAL) BY MOUTH DAILY. MUST KEEP SCHEDULED APPT FOR FUTURE REFILLS   levothyroxine 125 MCG tablet Commonly known as: SYNTHROID Take 0.5 tablets (62.5 mcg total) by mouth daily. Annual appt due in May must see provider for future refills   phenazopyridine 100 MG tablet Commonly known as: PYRIDIUM Take 100 mg by mouth 3 (three) times daily as needed for pain. URINARY DISCOMFORT   polyethylene glycol 17 g packet Commonly known as: MIRALAX / GLYCOLAX Take 17 g by mouth daily.   senna-docusate 8.6-50 MG tablet Commonly known as: Senokot-S Take 1 tablet by mouth 2 (two) times daily. For Constipation   solifenacin 10 MG tablet Commonly known as: VESICARE Take 1 tablet (10 mg total) by mouth daily.   vitamin B-12 1000 MCG tablet Commonly known as: CYANOCOBALAMIN Take 1 tablet (1,000 mcg total) by mouth daily.       Review of  Systems   In general she is not complaining of any fever or chills.  Skin is not complaining of rashes or itching she does have some bruising around surgical site but this appears typical..  Head ears eyes nose mouth and throat is not complain of visual changes or sore throat.  Respiratory does not complain of being short of breath or cough.  Cardiac does not complain of chest pain or increasing edema from baseline.  GI is not complaining of abdominal pain does have some history of constipation but this apparently has stabilized on the MiraLAX and senna-does not complain of any nausea vomiting or diarrhea.  GU is not complaining of dysuria at 1 point did but this has resolved-urine culture did not really grow out a significant amount of colonies of any organism.  Musculoskeletal is not complaining of joint pain has gained some strength does not really complain of hip pain.  Neurologic is not complaining of dizziness headache or numbness.  Psych does not complain of being depressed or anxious.  Does have a history of some mild memory loss    Vitals:   07/23/19 1643  BP: 130/79  Pulse: 78  Resp: 18  Temp: 98 F (36.7 C)  TempSrc: Oral  SpO2: 98%  Weight: 109 lb 1.6 oz (49.5 kg)  Height: 4\' 11"  (1.499 m)   Body mass index is 22.04 kg/m. Physical Exam  In general this is a pleasant elderly female in no distress sitting comfortably in her chair.  Her skin is warm and dry.  Eyes --visual acuity appears to be intact she has prescription lenses sclera and conjunctive are clear.  Oropharynx is clear mucous membranes moist.  Chest is clear to auscultation  there is no labored breathing.  Heart is regular rate and rhythm slightly bradycardic she has trace edema on the lower extremity pedal pulses intact.  Abdomen is soft nontender with positive bowel sounds.  Musculoskeletal is able to move all extremities x4 she is able to stand and use her walker-surgical site left hip  staples have been removed there is some postop bruising I do not see signs of acute cellulitis or infection any drainage or bleeding.  Neurologic is grossly intact her speech is clear cannot appreciate lateralizing findings.  Psych she is pleasant and appropriate does have an history of short-term memory loss         Labs reviewed:  Jul 16, 2019.  WBC 8.3 hemoglobin 10.1 platelets 197.  Sodium 139 potassium 4 BUN 16.7 creatinine 0.83.   Basic Metabolic Panel: Recent Labs    06/28/19 1701 06/29/19 0500 06/30/19 0625 07/02/19 0551 07/03/19 0627  NA   < >  --  138 137 136  K   < >  --  3.8 4.7 4.5  CL   < >  --  105 105 103  CO2   < >  --  26 29 26   GLUCOSE   < >  --  74 87 92  BUN   < >  --  23 22 16   CREATININE   < >  --  0.94 0.94 0.83  CALCIUM   < >  --  8.4* 8.4* 8.7*  MG  --  2.2  --   --   --    < > = values in this interval not displayed.   Liver Function Tests: Recent Labs    06/28/19 2031  AST 14*  ALT 15  ALKPHOS 38  BILITOT 0.4  PROT 5.3*  ALBUMIN 3.0*   No results for input(s): LIPASE, AMYLASE in the last 8760 hours. No results for input(s): AMMONIA in the last 8760 hours. CBC: Recent Labs    06/29/19 0500 06/29/19 0500 06/30/19 0625 07/02/19 0551 07/03/19 0627  WBC 6.5   < > 7.1 7.0 6.7  NEUTROABS 5.1  --   --   --   --   HGB 11.0*   < > 10.3* 9.8* 11.0*  HCT 34.0*   < > 32.8* 31.4* 34.0*  MCV 93.9   < > 94.5 94.9 93.4  PLT 128*   < > 105* 132* 152   < > = values in this interval not displayed.   Cardiac Enzymes: No results for input(s): CKTOTAL, CKMB, CKMBINDEX, TROPONINI in the last 8760 hours. BNP: Invalid input(s): POCBNP CBG: No results for input(s): GLUCAP in the last 8760 hours.  Procedures and Imaging Studies During Stay: DG Chest 2 View  Result Date: 06/28/2019 CLINICAL DATA:  Recent fall with known hip pain, initial encounter EXAM: CHEST - 2 VIEW COMPARISON:  02/21/2018 FINDINGS: Cardiac shadow is enlarged but  stable. Aortic calcifications are again noted. The lungs are well aerated bilaterally. Large hiatal hernia is again seen. No focal infiltrate is noted. Degenerative changes of the thoracic spine and shoulder joints are seen. No acute bony abnormality is noted. IMPRESSION: Large hiatal hernia stable from the prior study. No acute abnormality noted. Electronically Signed   By: Inez Catalina M.D.   On: 06/28/2019 17:52   CT Hip Left Wo Contrast  Result Date: 06/28/2019 CLINICAL DATA:  Left hip pain. Abnormal x-ray EXAM: CT OF THE LEFT HIP WITHOUT CONTRAST TECHNIQUE: Multidetector CT imaging of the left hip was  performed according to the standard protocol. Multiplanar CT image reconstructions were also generated. COMPARISON:  X-ray 06/28/2019 FINDINGS: Bones/Joint/Cartilage Acute nondisplaced left femoral neck fracture which is predominantly subcapital (series 7, images 36-38; series 6, images 25-29). No fracture extension into the intertrochanteric region or femoral head articular surface. Mild left hip joint space narrowing. No dislocation. Visualized left hemipelvis is intact without fracture. Mild degenerative changes of the left SI joint. No SI joint or pubic symphysis diastasis. Ligaments Suboptimally assessed by CT. Muscles and Tendons Grossly intact within the limitations of CT. Soft tissues No focal soft tissue fluid collection or hematoma. Atherosclerotic calcifications are noted. IMPRESSION: Acute nondisplaced left femoral neck fracture which is predominantly subcapital. Electronically Signed   By: Davina Poke D.O.   On: 06/28/2019 19:00   DG C-Arm 1-60 Min-No Report  Result Date: 06/30/2019 Fluoroscopy was utilized by the requesting physician.  No radiographic interpretation.   DG HIP OPERATIVE UNILAT W OR W/O PELVIS LEFT  Result Date: 06/30/2019 CLINICAL DATA:  LEFT hip fracture, pinning EXAM: OPERATIVE LEFT HIP (WITH PELVIS IF PERFORMED) 2 VIEWS TECHNIQUE: Fluoroscopic spot image(s) were  submitted for interpretation post-operatively. COMPARISON:  06/28/2019 LEFT hip radiographs and CT hip FLUOROSCOPY TIME:  0 minutes 33 seconds Dose: 1.3 mGy FINDINGS: Diffuse osseous demineralization. Three cannulated screws placed across a nondisplaced subcapital fracture of the LEFT femoral neck. No additional fracture or dislocation seen. IMPRESSION: Post pinning of LEFT femoral neck fracture. Electronically Signed   By: Lavonia Dana M.D.   On: 06/30/2019 16:17   DG Hip Unilat With Pelvis 2-3 Views Left  Result Date: 06/28/2019 CLINICAL DATA:  Recent fall with left hip pain, initial encounter EXAM: DG HIP (WITH OR WITHOUT PELVIS) 2-3V LEFT COMPARISON:  None. FINDINGS: Pelvic ring is intact. Degenerative changes of lumbar spine are noted. Mild lucency is noted at the junction of the femoral head and femoral neck suspicious for mildly impacted fracture. CT is recommended for further evaluation. IMPRESSION: Findings suspicious for subcapital femoral neck fracture. CT is recommended for further evaluation. Electronically Signed   By: Inez Catalina M.D.   On: 06/28/2019 17:51   XR HIP UNILAT W OR W/O PELVIS 1V LEFT  Result Date: 07/11/2019 An AP pelvis and lateral left hip shows cannulated screw fixation of a minimally displaced femoral neck fracture.  There is no healing as of yet at only 11 days postop.   Assessment/Plan:    #1 history of left femur fracture with repair-  She has had follow-up with Dr. Ninfa Linden and will be seeing him again.  She will need continued PT and OT she is doing well with minimal pain she does have every 8 hours.  2.  History of atrial fibrillation-this appears rate controlled she is on amiodarone 200 mg a day and Eliquis low-dose 2.5 mg twice daily for anticoagulation.  3.  Coronary artery disease appears asymptomatic at this point she is on Imdur 60 mg a day and atorvastatin 20 mg a day.  4.  CHF she continues on Lasix 20 mg every other day this appears to be  controlled without increasing edema from baseline any complaints of shortness of breath or chest pain  #5 history of hypothyroidism she is on Synthroid 62.5 mcg a day TSH in the hospital was 4.34-follow-up with primary care provider as needed.  6.  Anemia this continues to be minor with a hemoglobin hovering around the tender area most recently 10.1 follow-up as well as surgery as needed  #7 history of  B12 deficiency she continues on supplementation.  8.  History of overactive bladder she continues on Vista core-she says she no longer needs the Pyridium does not complain of any dysuria at this point.  9.  History of osteoporosis she continues on calcium with vitamin D at 1 point had been on Prolia and Fosamax-will defer aggressive follow-up of this to primary care provider.  10.  History of chronic constipation she continues on Senokot twice daily as well as MiraLAX as needed this appears to be stable she does not complain of constipation today.  11.  History of mild cognitive impairment.  This will warrant follow-up by primary care provider-she is pleasant and appropriate today-she will need again continued PT and OT as well as nursing support.  12.  History of GERD she continues on Nexium 40 mg daily this appears to be stable.  Again she will need follow-up by Dr. Ninfa Linden of orthopedics as well as her primary care provider for follow-up of her multiple medical issues.  [id number] note greater than 30 minutes spent on this discharge summary-greater than 50% of time spent coordinating and formulating a plan of care for numerous diagnoses

## 2019-07-24 ENCOUNTER — Encounter: Payer: Self-pay | Admitting: Internal Medicine

## 2019-07-26 ENCOUNTER — Telehealth: Payer: Self-pay

## 2019-07-26 NOTE — Telephone Encounter (Signed)
New message    [facility] needs verbal order  Nursing one-week x 1.   PT/OT will be calling for their order once the assessment is complete.

## 2019-07-26 NOTE — Telephone Encounter (Signed)
Ok for verbals 

## 2019-08-01 NOTE — Telephone Encounter (Signed)
Called [facility] there was  No answer LMOM w/MD response.Marland KitchenJohny Chess

## 2019-08-03 ENCOUNTER — Telehealth: Payer: Self-pay

## 2019-08-03 NOTE — Telephone Encounter (Signed)
[name] with [facility] would like PT orders for 2 x week for patient?  Cb# [phone].  Please advise.  Thank you.

## 2019-08-06 NOTE — Telephone Encounter (Signed)
LMOM for Cathy [name] giving him verbal

## 2019-08-07 ENCOUNTER — Ambulatory Visit: Payer: Medicare Other | Admitting: Internal Medicine

## 2019-08-08 ENCOUNTER — Ambulatory Visit (INDEPENDENT_AMBULATORY_CARE_PROVIDER_SITE_OTHER): Payer: Medicare Other

## 2019-08-08 ENCOUNTER — Other Ambulatory Visit: Payer: Self-pay

## 2019-08-08 ENCOUNTER — Encounter: Payer: Self-pay | Admitting: Orthopaedic Surgery

## 2019-08-08 ENCOUNTER — Ambulatory Visit (INDEPENDENT_AMBULATORY_CARE_PROVIDER_SITE_OTHER): Payer: Medicare Other | Admitting: Orthopaedic Surgery

## 2019-08-08 DIAGNOSIS — Z9889 Other specified postprocedural states: Secondary | ICD-10-CM

## 2019-08-08 DIAGNOSIS — S72002A Fracture of unspecified part of neck of left femur, initial encounter for closed fracture: Secondary | ICD-10-CM

## 2019-08-08 NOTE — Progress Notes (Signed)
The patient comes in today 6 weeks status post cannulated screw fixation of a minimally displaced femoral neck fracture of her left hip.  She is 86 years old.  She denies any hip pain.  She has been working with home health therapy on mobility.  On exam I can easily put her left hip through internal and external rotation and she does not withdraw to pain and states that that does not hurt her.  X-rays of her left hip with AP and lateral views show the hardware is intact and there is been interval healing of the fracture.  The hip joint itself is well located and there is no evidence of osteonecrosis.  At this point she will continue weightbearing as tolerated.  We can see her back in 3 months for repeat AP and lateral of the left hip only.  If there is any issues before then they know to let us know.

## 2019-08-09 ENCOUNTER — Ambulatory Visit: Payer: Medicare Other | Admitting: Cardiology

## 2019-08-10 ENCOUNTER — Ambulatory Visit: Payer: Medicare Other | Admitting: Internal Medicine

## 2019-08-15 ENCOUNTER — Encounter: Payer: Self-pay | Admitting: Internal Medicine

## 2019-08-15 ENCOUNTER — Ambulatory Visit (INDEPENDENT_AMBULATORY_CARE_PROVIDER_SITE_OTHER): Payer: Medicare Other | Admitting: Internal Medicine

## 2019-08-15 ENCOUNTER — Other Ambulatory Visit: Payer: Self-pay

## 2019-08-15 VITALS — BP 124/78 | HR 65 | Temp 98.8°F | Ht 59.0 in

## 2019-08-15 DIAGNOSIS — Z0000 Encounter for general adult medical examination without abnormal findings: Secondary | ICD-10-CM | POA: Diagnosis not present

## 2019-08-15 DIAGNOSIS — R269 Unspecified abnormalities of gait and mobility: Secondary | ICD-10-CM | POA: Diagnosis not present

## 2019-08-15 MED ORDER — TRANSPORT CHAIR MISC: 0 refills | Status: DC

## 2019-08-15 MED ORDER — TRANSPORT CHAIR MISC: 0 refills | Status: AC

## 2019-08-15 NOTE — Assessment & Plan Note (Signed)
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
## 2019-08-15 NOTE — Assessment & Plan Note (Signed)
Ok for [employer] per family request

## 2019-08-15 NOTE — Patient Instructions (Signed)
You are given the prescriptions for the standard walker, and transport chair  Please continue all other medications as before, and refills have been done if requested.  Please have the pharmacy call with any other refills you may need.  Please continue your efforts at being more active, low cholesterol diet, and weight control.  You are otherwise up to date with prevention measures today.  Please keep your appointments with your specialists as you may have planned  Please make an Appointment to return in 6 months, or sooner if needed

## 2019-08-22 ENCOUNTER — Other Ambulatory Visit: Payer: Self-pay | Admitting: Internal Medicine

## 2019-08-22 ENCOUNTER — Telehealth: Payer: Self-pay | Admitting: Internal Medicine

## 2019-08-22 NOTE — Telephone Encounter (Signed)
New message:   Pt's daughter is calling and states the pt has a little draining from her legs. She states they aren't red or anything but would like some advice on what needs to be done. Please advise.

## 2019-08-22 NOTE — Telephone Encounter (Signed)
Pt and dtr contacted and pt has been scheduled for 03:20pm tomorrow with PCP.

## 2019-08-23 ENCOUNTER — Other Ambulatory Visit: Payer: Self-pay

## 2019-08-23 ENCOUNTER — Encounter: Payer: Self-pay | Admitting: Internal Medicine

## 2019-08-23 ENCOUNTER — Ambulatory Visit (INDEPENDENT_AMBULATORY_CARE_PROVIDER_SITE_OTHER): Payer: Medicare Other | Admitting: Internal Medicine

## 2019-08-23 ENCOUNTER — Other Ambulatory Visit: Payer: Self-pay | Admitting: Cardiology

## 2019-08-23 VITALS — BP 96/42 | HR 57 | Temp 98.6°F | Ht 59.0 in | Wt 109.1 lb

## 2019-08-23 DIAGNOSIS — I10 Essential (primary) hypertension: Secondary | ICD-10-CM | POA: Diagnosis not present

## 2019-08-23 DIAGNOSIS — R739 Hyperglycemia, unspecified: Secondary | ICD-10-CM | POA: Diagnosis not present

## 2019-08-23 DIAGNOSIS — I5042 Chronic combined systolic (congestive) and diastolic (congestive) heart failure: Secondary | ICD-10-CM | POA: Diagnosis not present

## 2019-08-23 NOTE — Progress Notes (Signed)
Subjective:    Patient ID: Cathy [name], female    DOB: 30-Jan-1928, 86 y.o.   MRN: [id number]  HPI  Here with daughter after seen per PT a few days ago, who remarked about her weepy legs and wet socks onset a few days prior to that, stating she was at increased risk for staph infections if not addressed.  Overall seemed to occur after lasix decreased from qd to qod and leg swelling increased somewhat.  Today the weepiness seems to have resolved without specific treatment.  Has compression stockings at home but does not use.  Pt denies chest pain, increased sob or doe, wheezing, orthopnea, PND,  palpitations, dizziness or syncope.  Pt denies new neurological symptoms such as new headache, or facial or extremity weakness or numbness   Pt denies polydipsia, polyuria, Past Medical History:  Diagnosis Date  . Allergic rhinitis   . Anemia   . Anxiety   . Chronic systolic CHF (congestive heart failure) ([place])    a. 2D Echo 02/02/15: EF 25-30%, akinesis of mid-apical anteroseptal myocardium, grade 3 DD, mod MR, severely dilated LA, mildly reduced RV systoluc function, mod dilated RA, mod TR, mildly increased PASP 56mmHg. b. 10/2016: EF 55-60% with no regional WMA. Grade 3 DD.   [name] CLOSTRIDIUM DIFFICILE COLITIS 05/26/2009   Qualifier: Diagnosis of  By: Nils Pyle CMA ([place]), Mearl Latin    . Colitis    lymphocytic colitis feb 2011  . Coronary artery calcification    a. By CT 01/2015.  . Diverticulosis of colon   . DJD (degenerative joint disease)    gets epidural injections  . Dysphagia   . Hiatal hernia   . Hypercholesteremia   . Hypertension   . Iron deficiency   . Irritable bowel syndrome   . Liver lesion 02/18/2015  . Lumbar back pain   . Osteoporosis   . Paroxysmal atrial flutter ([place])    a. Dx 01/2015 - underwent DCCV 03/2015; takes Eliquis  . Sinus bradycardia   . Splenic lesion 02/18/2015  . Urinary incontinence   . Venous insufficiency   . Vitamin B12 deficiency    Past Surgical History:    Procedure Laterality Date  . ABDOMINAL HYSTERECTOMY    . CARDIOVERSION N/A 02/03/2015   Procedure: CARDIOVERSION;  Surgeon: Sueanne Margarita, MD;  Location: [facility] ENDOSCOPY;  Service: Cardiovascular;  Laterality: N/A;  . CARDIOVERSION N/A 03/10/2015   Procedure: CARDIOVERSION;  Surgeon: Dorothy Spark, MD;  Location: [place];  Service: Cardiovascular;  Laterality: N/A;  . CARDIOVERSION N/A 01/07/2017   Procedure: CARDIOVERSION;  Surgeon: Pixie Casino, MD;  Location: [facility] ENDOSCOPY;  Service: Cardiovascular;  Laterality: N/A;  . cataract surgery    . decompressive lumbar laminectomy  06/2008   at L2-3, L3-4 and L4-5 by Dr. Ronnald Ramp  . HIP PINNING,CANNULATED Left 06/30/2019   Procedure: CANNULATED LEFT HIP PINNING;  Surgeon: Mcarthur Rossetti, MD;  Location: [facility];  Service: Orthopedics;  Laterality: Left;  . LAPAROSCOPIC CHOLECYSTECTOMY  04/2009   Dr. Abran Cantor  . REPLACEMENT TOTAL KNEE    . TEE WITHOUT CARDIOVERSION N/A 02/03/2015   Procedure: TRANSESOPHAGEAL ECHOCARDIOGRAM (TEE);  Surgeon: Sueanne Margarita, MD;  Location: [facility] ENDOSCOPY;  Service: Cardiovascular;  Laterality: N/A;    reports that she has quit smoking. Her smoking use included cigarettes. She has a 5.00 pack-year smoking history. She has never used smokeless tobacco. She reports that she does not drink alcohol and does not use drugs. family history includes Cancer in her  brother, brother, and daughter; Coronary artery disease in her son; Diabetes in her brother and son; Heart attack in her brother, mother, and sister; Kidney disease in her son; Stroke in her son. Allergies  Allergen Reactions  . Amoxicillin-Pot Clavulanate Diarrhea    Has patient had a PCN reaction causing immediate rash, facial/tongue/throat swelling, SOB or lightheadedness with hypotension: no Has patient had a PCN reaction causing severe rash involving mucus membranes or skin necrosis:  no Has patient had a PCN reaction that required hospitalization:  no Has patient had a PCN reaction occurring within the last 10 years no If all of the above answers are "NO", then may proceed with Cephalosporin use.   . Sulfonamide Derivatives Other (See Comments)    unknown   Current Outpatient Medications on File Prior to Visit  Medication Sig Dispense Refill  . acetaminophen (TYLENOL) 325 MG tablet Take 650 mg by mouth every 8 (eight) hours.    [name] amiodarone (PACERONE) 200 MG tablet Take 1 tablet (200 mg total) by mouth daily. Please keep upcoming appt in June with Dr. Curt Bears before anymore refills. Thank you 30 tablet 0  . apixaban (ELIQUIS) 2.5 MG TABS tablet Take 1 tablet (2.5 mg total) by mouth 2 (two) times daily. 60 tablet 0  . atorvastatin (LIPITOR) 20 MG tablet TAKE 1 TABLET DAILY AT 6 P.M. Please keep upcoming appt with Dr. Curt Bears in June before anymore refills. Thank you 30 tablet 0  . calcium-vitamin D (OSCAL WITH D) 500-200 MG-UNIT per tablet Take 1 tablet by mouth daily.     . cholecalciferol (VITAMIN D) 1000 UNITS tablet Take 1,000 Units by mouth daily.    [name] esomeprazole (NEXIUM) 40 MG capsule Take 1 capsule (40 mg total) by mouth daily at 12 noon. 30 capsule 0  . furosemide (LASIX) 20 MG tablet Take 1 tablet (20 mg total) by mouth every other day. 15 tablet 0  . isosorbide mononitrate (IMDUR) 60 MG 24 hr tablet TAKE 1 TABLET (60 MG TOTAL) BY MOUTH DAILY. MUST KEEP SCHEDULED APPT FOR FUTURE REFILLS 30 tablet 0  . levothyroxine (SYNTHROID) 125 MCG tablet Take 0.5 tablets (62.5 mcg total) by mouth daily. Annual appt due in May must see provider for future refills 15 tablet 0  . Misc. Devices (TRANSPORT CHAIR) MISC Use as directed three times daily 1 each 0  . phenazopyridine (PYRIDIUM) 100 MG tablet Take 100 mg by mouth 3 (three) times daily as needed for pain. URINARY DISCOMFORT    . polyethylene glycol (MIRALAX / GLYCOLAX) 17 g packet Take 17 g by mouth daily.    [name] senna-docusate (SENOKOT-S) 8.6-50 MG tablet Take 1 tablet by mouth 2 (two)  times daily. For Constipation    . solifenacin (VESICARE) 10 MG tablet Take 1 tablet (10 mg total) by mouth daily. 30 tablet 0  . vitamin B-12 (CYANOCOBALAMIN) 1000 MCG tablet Take 1 tablet (1,000 mcg total) by mouth daily.     No current facility-administered medications on file prior to visit.   Review of Systems All otherwise neg per pt     Objective:   Physical Exam BP (!) 96/42 (BP Location: Left Arm, Patient Position: Sitting, Cuff Size: Large)   Pulse (!) 57   Temp 98.6 F (37 C) (Oral)   Ht 4\' 11"  (1.499 m)   Wt 109 lb 1.6 oz (49.5 kg)   SpO2 94%   BMI 22.04 kg/m  VS noted,  Constitutional: Pt appears in NAD HENT: Head: NCAT.  Right Ear:  External ear normal.  Left Ear: External ear normal.  Eyes: . Pupils are equal, round, and reactive to light. Conjunctivae and EOM are normal Nose: without d/c or deformity Neck: Neck supple. Gross normal ROM Cardiovascular: Normal rate and regular rhythm.   Pulmonary/Chest: Effort normal and breath sounds without rales or wheezing.  Abd:  Soft, NT, ND, + BS, no organomegaly Neurological: Pt is alert. At baseline orientation, motor grossly intact Skin: Skin is warm. No rashes, other new lesions, 1+ bilat LE edema left > right without weepiness or ulcers, no erythema or tenderness Psychiatric: Pt behavior is normal without agitation  All otherwise neg per pt Lab Results  Component Value Date   WBC 6.7 07/03/2019   HGB 11.0 (L) 07/03/2019   HCT 34.0 (L) 07/03/2019   PLT 152 07/03/2019   GLUCOSE 92 07/03/2019   CHOL 128 04/11/2017   TRIG 54.0 04/11/2017   HDL 63.30 04/11/2017   LDLDIRECT 158.1 05/29/2008   LDLCALC 54 04/11/2017   ALT 15 06/28/2019   AST 14 (L) 06/28/2019   NA 136 07/03/2019   K 4.5 07/03/2019   CL 103 07/03/2019   CREATININE 0.83 07/03/2019   BUN 16 07/03/2019   CO2 26 07/03/2019   TSH 4.321 06/28/2019   INR 1.3 (H) 06/28/2019   HGBA1C 5.5 10/10/2017      Assessment & Plan:

## 2019-08-23 NOTE — Assessment & Plan Note (Signed)
stable overall by history and exam, recent data reviewed with pt, and pt to continue medical treatment as before,  to f/u any worsening symptoms or concerns  

## 2019-08-23 NOTE — Patient Instructions (Signed)
Ok to continue the lasix at every other day, except for leg swelling days with weepiness as needed  Ok for compression stocking if able, leg elevation, low salt diet  Please continue all other medications as before, and refills have been done if requested.  Please have the pharmacy call with any other refills you may need.  Please keep your appointments with your specialists as you may have planned

## 2019-08-23 NOTE — Assessment & Plan Note (Addendum)
With some increased LE welling with reduced lasix., mild, currenlty non weepy, no evidence for cellulitis, so oK for lasix qod to continue with prn lasix for weepiness and increased swelling as well, to use compression stockings but may be difficult due to advanced age and lack of strength  I spent 31 minutes in preparing to see the patient by review of recent labs, imaging and procedures, obtaining and reviewing separately obtained history, communicating with the patient and family or caregiver, ordering medications, tests or procedures, and documenting clinical information in the EHR including the differential Dx, treatment, and any further evaluation and other management of chf, htn, hyperglycemia

## 2019-09-10 ENCOUNTER — Other Ambulatory Visit: Payer: Self-pay | Admitting: Cardiology

## 2019-09-12 NOTE — Telephone Encounter (Signed)
lmtcb

## 2019-09-13 ENCOUNTER — Telehealth: Payer: Self-pay

## 2019-09-13 NOTE — Telephone Encounter (Signed)
Verbal order given  

## 2019-09-13 NOTE — Telephone Encounter (Signed)
Eric from [facility] called in to put in pt order for patient.   PT 2 week 1   Call back 3 [phone]

## 2019-09-21 ENCOUNTER — Telehealth: Payer: Self-pay | Admitting: Orthopaedic Surgery

## 2019-09-21 NOTE — Telephone Encounter (Signed)
Called and sw Cathy [name] to advise verbal ok for PT order s as requested pt is s/p a fixation femoral neck fx left hip. Will call with questions.

## 2019-09-21 NOTE — Telephone Encounter (Signed)
Cathy [name] with [facility] called with verbal orders with the frequency of twice a week for three weeks and then once a week for three weeks.  Cathy [name] CB# [phone]

## 2019-09-26 NOTE — Telephone Encounter (Signed)
Spoke to [employer], dtr. Asked if pt had enough medication to get her through until her f/u w/ Dr. Curt Bears on 8/26.  Dtr unsure, but will check. (explained to her why I asked b/c last Rx sent was for 30 day on 5/24).  She will call back if pt doesn't not have enough to get her through. Otherwise reminded to keep overdue follow up next month. Patient verbalized understanding and agreeable to plan.   Will forward back to refill team to cancel refill request at this time.

## 2019-10-25 ENCOUNTER — Other Ambulatory Visit: Payer: Self-pay

## 2019-10-25 ENCOUNTER — Encounter: Payer: Self-pay | Admitting: Cardiology

## 2019-10-25 ENCOUNTER — Ambulatory Visit (INDEPENDENT_AMBULATORY_CARE_PROVIDER_SITE_OTHER): Payer: Medicare Other | Admitting: Cardiology

## 2019-10-25 VITALS — BP 102/62 | HR 59 | Ht 59.0 in | Wt 107.0 lb

## 2019-10-25 DIAGNOSIS — I483 Typical atrial flutter: Secondary | ICD-10-CM | POA: Diagnosis not present

## 2019-10-25 DIAGNOSIS — I4892 Unspecified atrial flutter: Secondary | ICD-10-CM

## 2019-10-25 MED ORDER — AMIODARONE HCL 200 MG PO TABS
200.0000 mg | ORAL_TABLET | Freq: Every day | ORAL | 1 refills | Status: DC
Start: 2019-10-25 — End: 2020-03-20

## 2019-10-25 MED ORDER — ISOSORBIDE MONONITRATE ER 60 MG PO TB24
60.0000 mg | ORAL_TABLET | Freq: Every day | ORAL | 1 refills | Status: DC
Start: 2019-10-25 — End: 2020-10-28

## 2019-10-25 MED ORDER — APIXABAN 2.5 MG PO TABS: 2.5000 mg | ORAL_TABLET | Freq: Two times a day (BID) | ORAL | 1 refills | Status: DC

## 2019-10-25 NOTE — Patient Instructions (Addendum)
Medication Instructions:  Your physician has recommended you make the following change in your medication:  1. TAKE Lasix 40 mg for the next 4 days, then return to 20 mg once daily  *If you need a refill on your cardiac medications before your next appointment, please call your pharmacy*   Lab Work: None ordered If you have labs (blood work) drawn today and your tests are completely normal, you will receive your results only by: [name] MyChart Message (if you have MyChart) OR . A paper copy in the mail If you have any lab test that is abnormal or we need to change your treatment, we will call you to review the results.   Testing/Procedures: None ordered   Follow-Up: At [facility], you and your health needs are our priority.  As part of our continuing mission to provide you with exceptional heart care, we have created designated Provider Care Teams.  These Care Teams include your primary Cardiologist (physician) and Advanced Practice Providers (APPs -  Physician Assistants and Nurse Practitioners) who all work together to provide you with the care you need, when you need it.  We recommend signing up for the patient portal called "MyChart".  Sign up information is provided on this After Visit Summary.  MyChart is used to connect with patients for Virtual Visits (Telemedicine).  Patients are able to view lab/test results, encounter notes, upcoming appointments, etc.  Non-urgent messages can be sent to your provider as well.   To learn more about what you can do with MyChart, go to [email or web address].    Your next appointment:   6 week(s)  The format for your next appointment:   In Person  Provider:   You will see one of the following Advanced Practice Providers on your designated Care Team:    Chanetta Marshall, NP  Tommye Standard, PA-C  Legrand Como "Oda [place], [place]   Your physician wants you to follow-up in: 6 months with Dr. Curt Bears. You will receive a reminder letter in the mail  two months in advance. If you don't receive a letter, please call our office to schedule the follow-up appointment.    Thank you for choosing [facility]!!   Trinidad Curet, RN [phone]    Other Instructions
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
## 2019-11-07 ENCOUNTER — Ambulatory Visit: Payer: Medicare Other | Admitting: Orthopaedic Surgery

## 2019-11-14 ENCOUNTER — Encounter: Payer: Self-pay | Admitting: Orthopaedic Surgery

## 2019-11-14 ENCOUNTER — Ambulatory Visit: Payer: Self-pay

## 2019-11-14 ENCOUNTER — Ambulatory Visit (INDEPENDENT_AMBULATORY_CARE_PROVIDER_SITE_OTHER): Payer: Medicare Other | Admitting: Orthopaedic Surgery

## 2019-11-14 DIAGNOSIS — S72002D Fracture of unspecified part of neck of left femur, subsequent encounter for closed fracture with routine healing: Secondary | ICD-10-CM

## 2019-11-14 DIAGNOSIS — Z9889 Other specified postprocedural states: Secondary | ICD-10-CM | POA: Diagnosis not present

## 2019-11-14 NOTE — Progress Notes (Signed)
The patient is an elderly 86 year old female who is now 4 months out from a left hip pinning with cannulated screws to treat her femoral neck fracture.  She still has some pain when she walks with a walker at home but she feels like she is getting better and the pain is minimal.  Her family is with her today.  On examination I can put her left hip through internal and external rotation and compression and she does not seem to have any pain on my exam today.  An AP pelvis and lateral of the left hip shows intact hardware with no evidence of osteonecrosis thus far.  There is no shortening either.  There has been interval healing of the fracture.  This point we will have her continue with weightbearing as tolerated.  I would like to repeat 2 views of the left hip in 3 months from now.  If things worsen at all the family knows to let us know.  All questions and concerns were answered and addressed.

## 2019-12-04 ENCOUNTER — Ambulatory Visit: Payer: Medicare Other | Admitting: Physician Assistant

## 2020-01-16 ENCOUNTER — Telehealth: Payer: Self-pay | Admitting: Cardiology

## 2020-01-16 NOTE — Telephone Encounter (Signed)
Pt c/o medication issue:  1. Name of Medication: apixaban (ELIQUIS) 2.5 MG TABS tablet  2. How are you currently taking this medication (dosage and times per day)? 2.5 mg twice daily  3. Are you having a reaction (difficulty breathing--STAT)? no  4. What is your medication issue? Insurance will not cover this medication next year.  If the patient has to be on this medication she will need prior authorization from the doctor.

## 2020-01-17 ENCOUNTER — Other Ambulatory Visit: Payer: Self-pay | Admitting: Pharmacist

## 2020-01-17 DIAGNOSIS — I4892 Unspecified atrial flutter: Secondary | ICD-10-CM

## 2020-01-17 MED ORDER — APIXABAN 2.5 MG PO TABS: 2.5000 mg | ORAL_TABLET | Freq: Two times a day (BID) | ORAL | 1 refills | Status: DC

## 2020-01-17 NOTE — Telephone Encounter (Signed)
**Note De-Identified  Obfuscation** No answer so I left a detailed message on the pts VM (ok per DPR) explaining that we cannot do a prior authorization on her Eliquis until 03/01/2020 and that her pharmacy will notify us that it is needed. I did advise in the VM to call Jeani Hawking at [phone] at [facility] office at [facility] if she has any questions or concerns about her Eliquis.

## 2020-01-17 NOTE — Telephone Encounter (Signed)
Left message to call back to discuss.

## 2020-01-18 NOTE — Telephone Encounter (Signed)
**Note De-Identified  Obfuscation** The pts daughter Cathy [name] is very concerned about the letter they received stating that [facility] will not be covering Eliquis next year. This was a long conversation as she does not want her moms anticoagulant changed and they cannot afford to pay for it oop.  I have reassured her that we will do all we can to keep her Mom on Eliquis at the same price she is paying now. She is aware that we will do a Eliquis PA as close to 03/01/20 as possible and that if it is denied we will appeal it. She is also aware to call us if the pts gets down to 2 to 3 days left of Elquis and that we will provide her with samples. She thanked me for calling her back and answering all of her questions.

## 2020-01-18 NOTE — Telephone Encounter (Signed)
Pt's daughter is returning Lynn's call. Please advise.

## 2020-02-13 ENCOUNTER — Ambulatory Visit: Payer: Medicare Other | Admitting: Orthopaedic Surgery

## 2020-03-04 ENCOUNTER — Telehealth: Payer: Self-pay | Admitting: Pharmacist

## 2020-03-04 NOTE — Telephone Encounter (Signed)
Called pt's daughter (per DPR) and LVM Also called pt-no answer Need prescription insurance information to process PA for patient's Eliquis. Info in computer is only for medical and partB. Will need prescription info

## 2020-03-06 NOTE — Telephone Encounter (Signed)
Called pharmacy for insurance info.  [facility]:  ID [id number] BIN [id number]  PCN ADV GRP [id number]  Prior authorization has been submitted.

## 2020-03-10 NOTE — Telephone Encounter (Signed)
Called and spoke with pt daughter. Advised that Eliquis was approved.She uses [facility], therefore cant use copay card, but did advise we could get her one if she [facility].

## 2020-03-19 ENCOUNTER — Other Ambulatory Visit: Payer: Self-pay | Admitting: Cardiology

## 2020-04-28 ENCOUNTER — Other Ambulatory Visit: Payer: Self-pay | Admitting: Internal Medicine

## 2020-04-28 NOTE — Telephone Encounter (Signed)
Please refill as per office routine med refill policy (all routine meds refilled for 3 mo or monthly per pt preference up to one year from last visit, then month to month grace period for 3 mo, then further med refills will have to be denied)  

## 2020-05-01 ENCOUNTER — Ambulatory Visit: Payer: Medicare Other | Admitting: Cardiology

## 2020-05-12 ENCOUNTER — Telehealth: Payer: Self-pay | Admitting: Cardiology

## 2020-05-12 DIAGNOSIS — I4892 Unspecified atrial flutter: Secondary | ICD-10-CM

## 2020-05-12 MED ORDER — APIXABAN 2.5 MG PO TABS: 2.5000 mg | ORAL_TABLET | Freq: Two times a day (BID) | ORAL | 1 refills | Status: DC

## 2020-05-12 NOTE — Telephone Encounter (Signed)
*  STAT* If patient is at the pharmacy, call can be transferred to refill team.   1. Which medications need to be refilled? (please list name of each medication and dose if known)  apixaban (ELIQUIS) 2.5 MG TABS tablet  2. Which pharmacy/location (including street and city if local pharmacy) is medication to be sent to? [facility] [place] [place] [facility] Portal to [facility]  3. Do they need a 30 day or 90 day supply? 90 with refills  Daughter states the patient's rx is expired. She just needs the office to [facility] a new [facility]

## 2020-05-12 NOTE — Telephone Encounter (Signed)
Rx sent as requested.

## 2020-07-15 ENCOUNTER — Ambulatory Visit: Payer: Medicare Other | Admitting: Cardiology

## 2020-08-12 ENCOUNTER — Other Ambulatory Visit: Payer: Self-pay | Admitting: Cardiology

## 2020-08-20 ENCOUNTER — Ambulatory Visit (INDEPENDENT_AMBULATORY_CARE_PROVIDER_SITE_OTHER): Payer: Medicare Other | Admitting: Dermatology

## 2020-08-20 ENCOUNTER — Encounter: Payer: Self-pay | Admitting: Dermatology

## 2020-08-20 ENCOUNTER — Other Ambulatory Visit: Payer: Self-pay

## 2020-08-20 DIAGNOSIS — M2041 Other hammer toe(s) (acquired), right foot: Secondary | ICD-10-CM

## 2020-08-20 DIAGNOSIS — L821 Other seborrheic keratosis: Secondary | ICD-10-CM | POA: Diagnosis not present

## 2020-08-20 DIAGNOSIS — Z1283 Encounter for screening for malignant neoplasm of skin: Secondary | ICD-10-CM | POA: Diagnosis not present

## 2020-08-26 ENCOUNTER — Other Ambulatory Visit: Payer: Self-pay | Admitting: Internal Medicine

## 2020-08-26 MED ORDER — FUROSEMIDE 20 MG PO TABS: 20.0000 mg | ORAL_TABLET | ORAL | 1 refills | Status: AC

## 2020-09-01 ENCOUNTER — Encounter: Payer: Self-pay | Admitting: Dermatology

## 2020-09-01 NOTE — Progress Notes (Signed)
   New Patient   Subjective  Cathy [name] is a 87 y.o. female who presents for the following: New Patient (Initial Visit) (Patient here today for check of lesions on both lower legs x months. Some bleeding, painful. Per patient's daughter and granddaughter the patient went to the Urgent Care and was told to see Dermatology because the lesions looked like SCC. Patient was also given Mupirocin and Doxy by Urgent Care. Personal history of non mole skin cancer. No personal history of atypical moles, or melanoma. No family history of atypical moles, melanoma or non mole skin cancer. ).  Check spots on legs and back. Location:  Duration:  Quality:  Associated Signs/Symptoms: Modifying Factors:  Severity:  Timing: Context:    The following portions of the chart were reviewed this encounter and updated as appropriate:  Tobacco  Allergies  Meds  Problems  Med Hx  Surg Hx  Fam Hx       Objective  Well appearing patient in no apparent distress; mood and affect are within normal limits. Right Hallux Metatarsophalangeal Joint Hammertoe, generally not painful  Left Lower Leg - Anterior Multiple 1 cm crusts on chronically edematous lower legs.  None currently inflamed.         Mid Back many 5 to 12 mm flattopped keratotic brown papules    A focused examination was performed including head, neck, back, arms, legs.. Relevant physical exam findings are noted in the Assessment and Plan.   Assessment & Plan  Hammer toe of right foot Right Hallux Metatarsophalangeal Joint  No intervention indicated  Screening exam for skin cancer Left Lower Leg - Anterior  I explained to the family that while these may represent carcinoma, the risk of spreading beyond the skin was quite small and the probability of these legs not healing after surgery quite high.  The family is content with nonintervention for now.  Seborrheic keratosis Mid Back  No intervention indicated

## 2020-09-25 ENCOUNTER — Other Ambulatory Visit: Payer: Self-pay

## 2020-09-25 ENCOUNTER — Ambulatory Visit (HOSPITAL_COMMUNITY)
Admission: EM | Admit: 2020-09-25 | Discharge: 2020-09-25 | Disposition: A | Discharge status: Home / Self Care | Payer: Medicare Other | Attending: Internal Medicine | Admitting: Internal Medicine

## 2020-09-25 ENCOUNTER — Encounter (HOSPITAL_COMMUNITY): Payer: Self-pay | Admitting: *Deleted

## 2020-09-25 DIAGNOSIS — L03115 Cellulitis of right lower limb: Secondary | ICD-10-CM

## 2020-09-25 HISTORY — DX: Carcinoma in situ, unspecified: D09.9

## 2020-09-25 MED ORDER — DOXYCYCLINE HYCLATE 100 MG PO CAPS: 100.0000 mg | ORAL_CAPSULE | Freq: Two times a day (BID) | ORAL | 0 refills | Status: AC

## 2020-09-25 NOTE — ED Provider Notes (Signed)
Cathy [name]    CSN: [id number] Arrival date & time: 09/25/20  1801      History   Chief Complaint Chief Complaint  Patient presents with   Leg Swelling    HPI Cathy [name] is a 87 y.o. female comes to the urgent care accompanied by her daughter on account of redness and swelling of the right lower extremity.  Patient has a history of chronic lower extremity swelling and takes Lasix every other day.  She says the redness started about a week ago and has been worsening.  She denies any fever or chills but she had a temperature of 100.6 on arrival at the urgent care.  No confusion.  No pain in the lower extremities.  No trauma to the legs or no falls.  No upper respiratory symptoms.  Patient denies any sick contacts.  HPI  Past Medical History:  Diagnosis Date   Allergic rhinitis    Anemia    Anxiety    Chronic systolic CHF (congestive heart failure) ([place])    a. 2D Echo 02/02/15: EF 25-30%, akinesis of mid-apical anteroseptal myocardium, grade 3 DD, mod MR, severely dilated LA, mildly reduced RV systoluc function, mod dilated RA, mod TR, mildly increased PASP 29mHg. b. 10/2016: EF 55-60% with no regional WMA. Grade 3 DD.    CLOSTRIDIUM DIFFICILE COLITIS 05/26/2009   Qualifier: Diagnosis of  By: KNils PyleCMA (AAMA), Leisha     Colitis    lymphocytic colitis feb 2011   Coronary artery calcification    a. By CT 01/2015.   Diverticulosis of colon    DJD (degenerative joint disease)    gets epidural injections   Dysphagia    Hiatal hernia    Hypercholesteremia    Hypertension    Iron deficiency    Irritable bowel syndrome    Liver lesion 02/18/2015   Lumbar back pain    Osteoporosis    Paroxysmal atrial flutter ([place]    a. Dx 01/2015 - underwent DCCV 03/2015; takes Eliquis   Sinus bradycardia    Splenic lesion 02/18/2015   Squamous cell carcinoma in situ    Urinary incontinence    Venous insufficiency    Vitamin B12 deficiency     Patient Active Problem List    Diagnosis Date Noted   Primary osteoarthritis of both hands 07/06/2019   [place] ([place] 06/29/2019   Closed fracture of neck of left femur ([place] 06/28/2019   GERD without esophagitis 06/28/2019   Noninfected skin tear of leg, right, initial encounter 04/19/2018   Bowel obstruction ([place] 02/21/2018   Hypothyroidism 02/21/2018   Osteoarthritis of left shoulder 08/10/2017   Pain in joint of left shoulder 08/10/2017   Mass of joint of left wrist 06/22/2017   Arthritis of left wrist 06/21/2017   Carpal tunnel syndrome 06/21/2017   Tenosynovitis of left wrist 06/21/2017   Fracture of distal end of humerus 03/25/2017   Closed supracondylar fracture of left humerus 01/15/2017   Gait disorder 01/15/2017   Recurrent falls 01/15/2017   Rib fractures 11/28/2016   Skin lesion 09/30/2016   Ganglion cyst of dorsum of left wrist 09/30/2016   Rotator cuff arthropathy, right 04/26/2016   Hyperglycemia 04/01/2016   Right arm pain 04/01/2016   Wrist swelling 09/30/2015   Preventative health care 04/02/2015   Chronic combined systolic and diastolic CHF (congestive heart failure) ([place] 03/17/2015   Coronary artery calcification seen on CT scan 03/17/2015   Sinus bradycardia 03/17/2015   Splenic lesion 02/18/2015  Liver lesion 02/18/2015   Coronary artery disease involving native coronary artery of native heart without angina pectoris 02/03/2015   Atrial fibrillation and flutter ([place]) 02/02/2015   Fatigue 01/31/2015   Abnormal CT of the abdomen 01/31/2015   Prolonged Q-T interval on ECG 01/31/2015   Hepatic lesion    Chronic constipation 12/03/2014   Lower extremity edema 04/25/2014   Peripheral neuropathy ([place]) 12/10/2013   Overactive bladder 12/27/2011   Senile osteoporosis 12/27/2011   Vitamin B12 deficiency 05/03/2010   Iron deficiency anemia 05/03/2010   Normocytic anemia 05/03/2010   VENOUS INSUFFICIENCY 01/27/2009   BACK PAIN, LUMBAR 01/27/2008   URINARY INCONTINENCE 08/23/2007    DIVERTICULOSIS OF COLON 04/10/2007   HYPERCHOLESTEROLEMIA 02/13/2007   ANXIETY 02/13/2007   Essential hypertension 02/13/2007   HIATAL HERNIA 02/13/2007   Irritable bowel syndrome 02/13/2007   DEGENERATIVE JOINT DISEASE 02/13/2007   Allergic rhinitis 02/13/2007    Past Surgical History:  Procedure Laterality Date   ABDOMINAL HYSTERECTOMY     CARDIOVERSION N/A 02/03/2015   Procedure: CARDIOVERSION;  Surgeon: Sueanne Margarita, MD;  Location: [facility];  Service: Cardiovascular;  Laterality: N/A;   CARDIOVERSION N/A 03/10/2015   Procedure: CARDIOVERSION;  Surgeon: Dorothy Spark, MD;  Location: [facility];  Service: Cardiovascular;  Laterality: N/A;   CARDIOVERSION N/A 01/07/2017   Procedure: CARDIOVERSION;  Surgeon: Pixie Casino, MD;  Location[facility] ENDOSCOPY;  Service: Cardiovascular;  Laterality: N/A;   cataract surgery     decompressive lumbar laminectomy  06/2008   at L2-3, L3-4 and L4-5 by Dr. Ronnald Ramp   HIP PINNING,CANNULATED Left 06/30/2019   Procedure: CANNULATED LEFT HIP PINNING;  Surgeon: Mcarthur Rossetti, MD;  Location: [facility];  Service: Orthopedics;  Laterality: Left;   LAPAROSCOPIC CHOLECYSTECTOMY  04/2009   Dr. Abran Cantor   REPLACEMENT TOTAL KNEE     TEE WITHOUT CARDIOVERSION N/A 02/03/2015   Procedure: TRANSESOPHAGEAL ECHOCARDIOGRAM (TEE);  Surgeon: Sueanne Margarita, MD;  Location: [facility] ENDOSCOPY;  Service: Cardiovascular;  Laterality: N/A;    OB History   No obstetric history on file.      Home Medications    Prior to Admission medications   Medication Sig Start Date End Date Taking? Authorizing Provider  acetaminophen (TYLENOL) 325 MG tablet Take 650 mg by mouth every 8 (eight) hours.   Yes [provider]  amiodarone (PACERONE) 200 MG tablet TAKE 1 TABLET DAILY 08/12/20  Yes Camnitz, Ocie Doyne, MD  apixaban (ELIQUIS) 2.5 MG TABS tablet Take 1 tablet (2.5 mg total) by mouth 2 (two) times daily. 05/12/20  Yes Camnitz, Will Hassell Done, MD  atorvastatin  (LIPITOR) 20 MG tablet TAKE 1 TABLET DAILY AT 6PM 08/12/20  Yes Camnitz, Ocie Doyne, MD  calcium-vitamin D (OSCAL WITH D) 500-200 MG-UNIT per tablet Take 1 tablet by mouth daily.    Yes [provider]  cholecalciferol (VITAMIN D) 1000 UNITS tablet Take 1,000 Units by mouth daily.   Yes [provider]  doxycycline (VIBRAMYCIN) 100 MG capsule Take 1 capsule (100 mg total) by mouth 2 (two) times daily for 7 days. 09/25/20 10/02/20 Yes Dawnn Nam, Myrene Galas, MD  esomeprazole (NEXIUM) 40 MG capsule Take 1 capsule (40 mg total) by mouth daily at 12 noon. 07/23/19  Yes Lassen, Arlo C, PA-C  furosemide (LASIX) 20 MG tablet Take 1 tablet (20 mg total) by mouth every other day. 08/26/20  Yes Biagio Borg, MD  isosorbide mononitrate (IMDUR) 60 MG 24 hr tablet Take 1 tablet (60 mg total) by mouth  daily. 10/25/19  Yes Camnitz, Ocie Doyne, MD  polyethylene glycol (MIRALAX / GLYCOLAX) 17 g packet Take 17 g by mouth daily. 07/03/19  Yes [provider]  senna-docusate (SENOKOT-S) 8.6-50 MG tablet Take 1 tablet by mouth 2 (two) times daily. For Constipation   Yes [provider]  solifenacin (VESICARE) 10 MG tablet Take 1 tablet (10 mg total) by mouth daily. 07/23/19  Yes Lassen, Arlo C, PA-C  SYNTHROID 125 MCG tablet TAKE 1/2 TABLET DAILY. 04/30/20  Yes Biagio Borg, MD  vitamin B-12 (CYANOCOBALAMIN) 1000 MCG tablet Take 1 tablet (1,000 mcg total) by mouth daily. 06/17/14  Yes Rowe Clack, MD  Misc. Devices ([employer] [place]) MISC Use as directed three times daily 08/15/19   Biagio Borg, MD    Family History Family History  Problem Relation Age of Onset   Cancer Brother        lung   Diabetes Son    Coronary artery disease Son    Kidney disease Son    Stroke Son    Heart attack Mother    Cancer Brother        colon   Cancer Daughter        cervical, lung    Heart attack Brother    Diabetes Brother    Heart attack Sister    Hypertension Neg Hx    Fainting Neg Hx      Social History Social History   Tobacco Use   Smoking status: Former    Packs/day: 0.50    Years: 10.00    Pack years: 5.00    Types: Cigarettes   Smokeless tobacco: Never  Vaping Use   Vaping Use: Never used  Substance Use Topics   Alcohol use: No   Drug use: Never     Allergies   Amoxicillin-pot clavulanate, Penicillins, and Sulfonamide derivatives   Review of Systems Review of Systems  Genitourinary: Negative.   Musculoskeletal: Negative.   Skin:  Positive for color change and wound. Negative for rash.  Neurological: Negative.   Psychiatric/Behavioral:  Negative for confusion.     Physical Exam Triage Vital Signs ED Triage Vitals [09/25/20 1821]  Enc Vitals Group     BP 138/65     Pulse Rate 61     Resp (!) 22     Temp (!) 100.6 F (38.1 C)     Temp Source Oral     SpO2 94 %     Weight      Height      Head Circumference      Peak Flow      Pain Score      Pain Loc      Pain Edu?      Excl. in [place]?    No data found.  Updated Vital Signs BP 138/65   Pulse 61   Temp (!) 100.6 F (38.1 C) (Oral)   Resp (!) 22   SpO2 94%   Visual Acuity Right Eye Distance:   Left Eye Distance:   Bilateral Distance:    Right Eye Near:   Left Eye Near:    Bilateral Near:     Physical Exam Vitals and nursing note reviewed.  Constitutional:      General: She is not in acute distress.    Appearance: She is not ill-appearing.  Cardiovascular:     Rate and Rhythm: Normal rate and regular rhythm.  Musculoskeletal:        General: Swelling present. No  tenderness, deformity or signs of injury.     Comments: Erythema of the right leg.  Erythematous area measures about 15 cm in the longest diameter.  It is not circumferential.  She has multiple skin lesions consistent with basal cell carcinoma.  Neurological:     Mental Status: She is alert.     UC Treatments / Results  Labs (all labs ordered are listed, but only abnormal results are displayed) Labs  Reviewed - No data to display  EKG   Radiology No results found.  Procedures Procedures (including critical care time)  Medications Ordered in UC Medications - No data to display  Initial Impression / Assessment and Plan / UC Course  I have reviewed the triage vital signs and the nursing notes.  Pertinent labs & imaging results that were available during my care of the patient were reviewed by me and considered in my medical decision making (see chart for details).    1.  Cellulitis of the right leg: Doxycycline 100 mg twice daily x7 days Elevation of the lower extremities as much as possible Patient is advised to take Lasix 20 mg orally daily for the next 3 days to help with lower extremity swelling If she has worsening symptoms she is advised to return to urgent care to be reevaluated. Final Clinical Impressions(s) / UC Diagnoses   Final diagnoses:  Cellulitis of leg, right     Discharge Instructions      Take medications as prescribed Please take your Lasix 20 mg orally daily for the next 3 days and then go back to your usual alternate day regimen. Elevate your legs as much as you can Compression stockings after the infection has resolved will be helpful Return to urgent care if symptoms worsen or go to ED if you have confusion with worsening symptoms.   ED Prescriptions     Medication Sig Dispense Auth. Provider   doxycycline (VIBRAMYCIN) 100 MG capsule Take 1 capsule (100 mg total) by mouth 2 (two) times daily for 7 days. 14 capsule Tymesha Ditmore, Myrene Galas, MD      PDMP not reviewed this encounter.   Chase Picket, MD 09/25/20 [phone]

## 2020-09-25 NOTE — Discharge Instructions (Addendum)
Take medications as prescribed Please take your Lasix 20 mg orally daily for the next 3 days and then go back to your usual alternate day regimen. Elevate your legs as much as you can Compression stockings after the infection has resolved will be helpful Return to urgent care if symptoms worsen or go to ED if you have confusion with worsening symptoms.

## 2020-09-25 NOTE — ED Triage Notes (Signed)
Per caregiver:  Pt has multiple areas of squamous cell carcinoma to right lower leg.  C/O redness and swelling w/ warmth to right lower leg over past week. Denies fevers at home. Discussed w/ Dr. Lanny Cramp - pt OK to be seen in ED.[name]

## 2020-09-30 ENCOUNTER — Ambulatory Visit: Payer: Medicare Other | Admitting: Cardiology

## 2020-10-23 ENCOUNTER — Encounter (HOSPITAL_COMMUNITY): Payer: Self-pay | Admitting: Emergency Medicine

## 2020-10-23 ENCOUNTER — Ambulatory Visit (HOSPITAL_COMMUNITY)
Admission: EM | Admit: 2020-10-23 | Discharge: 2020-10-23 | Disposition: A | Discharge status: Home / Self Care | Payer: Medicare Other | Attending: Internal Medicine | Admitting: Internal Medicine

## 2020-10-23 ENCOUNTER — Other Ambulatory Visit: Payer: Self-pay

## 2020-10-23 DIAGNOSIS — S81811A Laceration without foreign body, right lower leg, initial encounter: Secondary | ICD-10-CM

## 2020-10-23 MED ORDER — BACITRACIN ZINC 500 UNIT/GM EX OINT: 1.0000 "application " | TOPICAL_OINTMENT | Freq: Two times a day (BID) | CUTANEOUS | 0 refills | Status: AC

## 2020-10-23 NOTE — Discharge Instructions (Addendum)
Compression stockings will help reduce swelling in the legs Daily wound dressing changes with topical antibiotic ointment If you notice any redness, swelling, fever or chills please return to urgent care to be reevaluated.

## 2020-10-23 NOTE — ED Triage Notes (Signed)
Reportedly, patient has a laceration to right lower leg.  Last night , walker fell over and the leg of walker cut right lower leg.  Family has wrapped wound, family reports wound is weeping  Dr Lanny Cramp  reviewed wound.

## 2020-10-24 NOTE — ED Provider Notes (Signed)
Crystal    CSN: [id number] Arrival date & time: 10/23/20  1741      History   Chief Complaint Chief Complaint  Patient presents with   Laceration    HPI Cathy [name] is a 87 y.o. female is brought to the urgent care by his daughter for a laceration on the right leg.  Patient apparently sustained the injury while she was trying to maneuver her walker into the bathroom. Injury happened last night. Bleeding is controlled. Patient has chronic pedal edema and is currently experiencing weeping from the laceration site.  There is no surrounding erythema. Laceration is not painful.  HPI  Past Medical History:  Diagnosis Date   Allergic rhinitis    Anemia    Anxiety    Chronic systolic CHF (congestive heart failure) ([address])    a. 2D Echo 02/02/15: EF 25-30%, akinesis of mid-apical anteroseptal myocardium, grade 3 DD, mod MR, severely dilated LA, mildly reduced RV systoluc function, mod dilated RA, mod TR, mildly increased PASP 62mHg. b. 10/2016: EF 55-60% with no regional WMA. Grade 3 DD.    CLOSTRIDIUM DIFFICILE COLITIS 05/26/2009   Qualifier: Diagnosis of  By: KNils PyleCMA (AAMA), Leisha     Colitis    lymphocytic colitis feb 2011   Coronary artery calcification    a. By CT 01/2015.   Diverticulosis of colon    DJD (degenerative joint disease)    gets epidural injections   Dysphagia    Hiatal hernia    Hypercholesteremia    Hypertension    Iron deficiency    Irritable bowel syndrome    Liver lesion 02/18/2015   Lumbar back pain    Osteoporosis    Parox[place]mal atrial flutter ([place]    a. Dx 01/2015 - underwent DCCV 03/2015; takes Eliquis   Sinus bradycardia    Splenic lesion 02/18/2015   Squamous cell carcinoma in situ    Urinary incontinence    Venous insufficiency    Vitamin B12 deficiency     Patient Active Problem List   Diagnosis Date Noted   Primary osteoarthritis of both hands 07/06/2019   Hip fracture ([place] 06/29/2019   Closed fracture of neck of  left femur ([place] 06/28/2019   GERD without esophagitis 06/28/2019   Noninfected skin tear of leg, right, initial encounter 04/19/2018   Bowel obstruction ([place] 02/21/2018   Hypothyroidism 02/21/2018   Osteoarthritis of left shoulder 08/10/2017   Pain in joint of left shoulder 08/10/2017   Mass of joint of left wrist 06/22/2017   Arthritis of left wrist 06/21/2017   Carpal tunnel syndrome 06/21/2017   Tenosynovitis of left wrist 06/21/2017   Fracture of distal end of humerus 03/25/2017   Closed supracondylar fracture of left humerus 01/15/2017   Gait disorder 01/15/2017   Recurrent falls 01/15/2017   Rib fractures 11/28/2016   Skin lesion 09/30/2016   Ganglion cyst of dorsum of left wrist 09/30/2016   Rotator cuff arthropathy, right 04/26/2016   Hyperglycemia 04/01/2016   Right arm pain 04/01/2016   Wrist swelling 09/30/2015   Preventative health care 04/02/2015   Chronic combined systolic and diastolic CHF (congestive heart failure) ([place] 03/17/2015   Coronary artery calcification seen on CT scan 03/17/2015   Sinus bradycardia 03/17/2015   Splenic lesion 02/18/2015   Liver lesion 02/18/2015   Coronary artery disease involving native coronary artery of native heart without angina pectoris 02/03/2015   Atrial fibrillation and flutter ([place] 02/02/2015   Fatigue 01/31/2015   Abnormal CT of  the abdomen 01/31/2015   Prolonged Q-T interval on ECG 01/31/2015   Hepatic lesion    Chronic constipation 12/03/2014   Lower extremity edema 04/25/2014   Peripheral neuropathy ([facility]) 12/10/2013   Overactive bladder 12/27/2011   Senile osteoporosis 12/27/2011   Vitamin B12 deficiency 05/03/2010   Iron deficiency anemia 05/03/2010   Normocytic anemia 05/03/2010   VENOUS INSUFFICIENCY 01/27/2009   BACK PAIN, LUMBAR 01/27/2008   URINARY INCONTINENCE 08/23/2007   DIVERTICULOSIS OF COLON 04/10/2007   HYPERCHOLESTEROLEMIA 02/13/2007   ANXIETY 02/13/2007   Essential hypertension 02/13/2007    HIATAL HERNIA 02/13/2007   Irritable bowel syndrome 02/13/2007   DEGENERATIVE JOINT DISEASE 02/13/2007   Allergic rhinitis 02/13/2007    Past Surgical History:  Procedure Laterality Date   ABDOMINAL HYSTERECTOMY     CARDIOVERSION N/A 02/03/2015   Procedure: CARDIOVERSION;  Surgeon: Sueanne Margarita, MD;  Location: [facility];  Service: Cardiovascular;  Laterality: N/A;   CARDIOVERSION N/A 03/10/2015   Procedure: CARDIOVERSION;  Surgeon: Dorothy Spark, MD;  Location: [facility] ENDOSCOPY;  Service: Cardiovascular;  Laterality: N/A;   CARDIOVERSION N/A 01/07/2017   Procedure: CARDIOVERSION;  Surgeon: Pixie Casino, MD;  Location: [facility] ENDOSCOPY;  Service: Cardiovascular;  Laterality: N/A;   cataract surgery     decompressive lumbar laminectomy  06/2008   at L2-3, L3-4 and L4-5 by Dr. Ronnald Ramp   HIP PINNING,CANNULATED Left 06/30/2019   Procedure: CANNULATED LEFT HIP PINNING;  Surgeon: Mcarthur Rossetti, MD;  Location: [facility];  Service: Orthopedics;  Laterality: Left;   LAPAROSCOPIC CHOLECYSTECTOMY  04/2009   Dr. Abran Cantor   REPLACEMENT TOTAL KNEE     TEE WITHOUT CARDIOVERSION N/A 02/03/2015   Procedure: TRANSESOPHAGEAL ECHOCARDIOGRAM (TEE);  Surgeon: Sueanne Margarita, MD;  Location: [facility] ENDOSCOPY;  Service: Cardiovascular;  Laterality: N/A;    OB History   No obstetric history on file.      Home Medications    Prior to Admission medications   Medication Sig Start Date End Date Taking? Authorizing Provider  bacitracin ointment Apply 1 application topically 2 (two) times daily. 10/23/20  Yes Onesty Clair, Myrene Galas, MD  acetaminophen (TYLENOL) 325 MG tablet Take 650 mg by mouth every 8 (eight) hours.    [provider]  amiodarone (PACERONE) 200 MG tablet TAKE 1 TABLET DAILY 08/12/20   Camnitz, Ocie Doyne, MD  apixaban (ELIQUIS) 2.5 MG TABS tablet Take 1 tablet (2.5 mg total) by mouth 2 (two) times daily. 05/12/20   Camnitz, Will Hassell Done, MD  atorvastatin (LIPITOR) 20 MG tablet TAKE 1 TABLET  DAILY AT [facility] 08/12/20   Camnitz, Ocie Doyne, MD  calcium-vitamin D (OSCAL WITH D) 500-200 MG-UNIT per tablet Take 1 tablet by mouth daily.     [provider]  cholecalciferol (VITAMIN D) 1000 UNITS tablet Take 1,000 Units by mouth daily.    [provider]  esomeprazole (NEXIUM) 40 MG capsule Take 1 capsule (40 mg total) by mouth daily at 12 noon. 07/23/19   [place] Lewis C, PA-C  furosemide (LASIX) 20 MG tablet Take 1 tablet (20 mg total) by mouth every other day. 08/26/20   Biagio Borg, MD  isosorbide mononitrate (IMDUR) 60 MG 24 hr tablet Take 1 tablet (60 mg total) by mouth daily. 10/25/19   Camnitz, Ocie Doyne, MD  Misc. Devices [facility]) MISC Use as directed three times daily 08/15/19   Biagio Borg, MD  polyethylene glycol (MIRALAX / GLYCOLAX) 17 g packet Take 17 g by mouth daily. 07/03/19   [provider]  senna-docusate (SENOKOT-S) 8.6-50 MG tablet Take 1 tablet by mouth 2 (two) times daily. For Constipation    [provider]  solifenacin (VESICARE) 10 MG tablet Take 1 tablet (10 mg total) by mouth daily. 07/23/19   [place] Lewis C, PA-C  SYNTHROID 125 MCG tablet TAKE 1/2 TABLET DAILY. 04/30/20   Biagio Borg, MD  vitamin B-12 (CYANOCOBALAMIN) 1000 MCG tablet Take 1 tablet (1,000 mcg total) by mouth daily. 06/17/14   Rowe Clack, MD    Family History Family History  Problem Relation Age of Onset   Cancer Brother        lung   Diabetes Son    Coronary artery disease Son    Kidney disease Son    Stroke Son    Heart attack Mother    Cancer Brother        colon   Cancer Daughter        cervical, lung    Heart attack Brother    Diabetes Brother    Heart attack Sister    Hypertension Neg Hx    Fainting Neg Hx     Social History Social History   Tobacco Use   Smoking status: Former    Packs/day: 0.50    Years: 10.00    Pack years: 5.00    Types: Cigarettes   Smokeless tobacco: Never  Vaping Use   Vaping Use: Never used   Substance Use Topics   Alcohol use: No   Drug use: Never     Allergies   Amoxicillin-pot clavulanate, Penicillins, and Sulfonamide derivatives   Review of Systems Review of Systems  Musculoskeletal: Negative.   Skin:  Positive for wound. Negative for color change, pallor and rash.  Neurological: Negative.     Physical Exam Triage Vital Signs ED Triage Vitals  Enc Vitals Group     BP 10/23/20 1806 (!) 105/53     Pulse Rate 10/23/20 1806 (!) 52     Resp --      Temp 10/23/20 1806 98.7 F (37.1 C)     Temp Source 10/23/20 1806 Oral     SpO2 10/23/20 1806 97 %     Weight --      Height --      Head Circumference --      Peak Flow --      Pain Score 10/23/20 1814 0     Pain Loc --      Pain Edu? --      Excl. in [place]? --    No data found.  Updated Vital Signs BP (!) 105/53 (BP Location: Left Arm)   Pulse (!) 52   Temp 98.7 F (37.1 C) (Oral)   SpO2 97%   Visual Acuity Right Eye Distance:   Left Eye Distance:   Bilateral Distance:    Right Eye Near:   Left Eye Near:    Bilateral Near:     Physical Exam Vitals and nursing note reviewed.  Constitutional:      Appearance: Normal appearance.  Musculoskeletal:     Right lower leg: Edema present.     Left lower leg: Edema present.  Skin:    Comments: 2 inch linear laceration over the anterior lateral aspect of the right leg.  No swelling.  No surrounding erythema.  Neurological:     Mental Status: She is alert.     UC Treatments / Results  Labs (all labs ordered are listed, but only abnormal results are  displayed) Labs Reviewed - No data to display  EKG   Radiology No results found.  Procedures Procedures (including critical care time)  Medications Ordered in UC Medications - No data to display  Initial Impression / Assessment and Plan / UC Course  I have reviewed the triage vital signs and the nursing notes.  Pertinent labs & imaging results that were available during my care of the  patient were reviewed by me and considered in my medical decision making (see chart for details).     1.  Right lower extremity laceration: Patient's skin is thin hence suturing is impossible Skin glue is not possible because of weeping from the wound The wound dressing changes with antibiotic ointment Elevate lower extremities Compression stockings will help with chronic lower extremity edema Patient is encouraged to take Lasix as prescribed. Return precautions given. Final Clinical Impressions(s) / UC Diagnoses   Final diagnoses:  Laceration of right lower extremity excluding thigh, initial encounter     Discharge Instructions      Compression stockings will help reduce swelling in the legs Daily wound dressing changes with topical antibiotic ointment If you notice any redness, swelling, fever or chills please return to urgent care to be reevaluated.   ED Prescriptions     Medication Sig Dispense Auth. Provider   bacitracin ointment Apply 1 application topically 2 (two) times daily. 120 g Jerae Izard, Myrene Galas, MD      PDMP not reviewed this encounter.   Chase Picket, MD 10/24/20 [phone]

## 2020-10-25 ENCOUNTER — Other Ambulatory Visit: Payer: Self-pay | Admitting: Cardiology

## 2020-10-25 DIAGNOSIS — I4892 Unspecified atrial flutter: Secondary | ICD-10-CM

## 2020-10-27 NOTE — Telephone Encounter (Signed)
Pt last saw Dr Curt Bears 10/25/19, due for follow-up.  Pt has appt scheduled for 11/11/20 with Dr Curt Bears.  Last labs 07/03/19 Creat 0.83, pt is overdue for labwork. Placed note on upcoming appt with Dr Curt Bears to draw CBC and BMP at [place].  Age 87, weight 48.5kg,  based on specified criteria pt is on appropriate dosage of Eliquis 2.'5mg'$  BID for a flutter.  Will refill rx to get pt to upcoming appt.

## 2020-11-11 ENCOUNTER — Ambulatory Visit: Payer: Medicare Other | Admitting: Cardiology

## 2020-12-08 NOTE — Progress Notes (Deleted)
Cardiology Office Note Date:  12/08/2020  Patient ID:  Cathy [name], Cathy [name] [birth year], MRN [id number] PCP:  Biagio Borg, MD  Electrophysiologist: Dr. Curt Bears  ***refresh   Chief Complaint: *** overdue 6 mo  History of Present Illness: Cathy [name] is a 87 y.o. female with history of HTN, venous insufficiency, HLD, AFlutter, chronic combined CHF (*** with recovered LVEF)  She comes in today to be seen for Dr. Curt Bears, last seen by him Aug 2021, she was unaware of her AFlutter, rate controlled, LBBB. Unclear though suspected she was back in AFlutter for a few months, planned to try DCCV at the patient's timing preference (once COVID numbers came down). She has some volume on board and her lasix increased Planned for early follow up though this was the last visit she has had.   *** needs amio labs *** falls? Injuries *** eliquis, bleeding, dose, labs *** DCCV?? *** Rate controllers?  BP room? *** stop amio? *** update echo 1st *** volume?   AF hx Diagnosed 2016 with rapid Aflutter Amiodarone started 2016   Past Medical History:  Diagnosis Date   Allergic rhinitis    Anemia    Anxiety    Chronic systolic CHF (congestive heart failure) ([place])    a. 2D Echo 02/02/15: EF 25-30%, akinesis of mid-apical anteroseptal myocardium, grade 3 DD, mod MR, severely dilated LA, mildly reduced RV systoluc function, mod dilated RA, mod TR, mildly increased PASP 33mmHg. b. 10/2016: EF 55-60% with no regional WMA. Grade 3 DD.    CLOSTRIDIUM DIFFICILE COLITIS 05/26/2009   Qualifier: Diagnosis of  By: Nils Pyle CMA (AAMA), Leisha     Colitis    lymphocytic colitis feb 2011   Coronary artery calcification    a. By CT 01/2015.   Diverticulosis of colon    DJD (degenerative joint disease)    gets epidural injections   Dysphagia    Hiatal hernia    Hypercholesteremia    Hypertension    Iron deficiency    Irritable bowel syndrome    Liver lesion 02/18/2015   Lumbar back pain     Osteoporosis    Paroxysmal atrial flutter ([place])    a. Dx 01/2015 - underwent DCCV 03/2015; takes Eliquis   Sinus bradycardia    Splenic lesion 02/18/2015   Squamous cell carcinoma in situ    Urinary incontinence    Venous insufficiency    Vitamin B12 deficiency     Past Surgical History:  Procedure Laterality Date   ABDOMINAL HYSTERECTOMY     CARDIOVERSION N/A 02/03/2015   Procedure: CARDIOVERSION;  Surgeon: Sueanne Margarita, MD;  Location: [facility];  Service: Cardiovascular;  Laterality: N/A;   CARDIOVERSION N/A 03/10/2015   Procedure: CARDIOVERSION;  Surgeon: Dorothy Spark, MD;  Location: [facility];  Service: Cardiovascular;  Laterality: N/A;   CARDIOVERSION N/A 01/07/2017   Procedure: CARDIOVERSION;  Surgeon: Pixie Casino, MD;  Location: [facility];  Service: Cardiovascular;  Laterality: N/A;   cataract surgery     decompressive lumbar laminectomy  06/2008   at L2-3, L3-4 and L4-5 by Dr. Ronnald Ramp   HIP PINNING,CANNULATED Left 06/30/2019   Procedure: CANNULATED LEFT HIP PINNING;  Surgeon: Mcarthur Rossetti, MD;  Location: [facility];  Service: Orthopedics;  Laterality: Left;   LAPAROSCOPIC CHOLECYSTECTOMY  04/2009   Dr. Abran Cantor   REPLACEMENT TOTAL KNEE     TEE WITHOUT CARDIOVERSION N/A 02/03/2015   Procedure: TRANSESOPHAGEAL ECHOCARDIOGRAM (TEE);  Surgeon: Sueanne Margarita, MD;  Location: [facility]  ENDOSCOPY;  Service: Cardiovascular;  Laterality: N/A;    Current Outpatient Medications  Medication Sig Dispense Refill   acetaminophen (TYLENOL) 325 MG tablet Take 650 mg by mouth every 8 (eight) hours.     amiodarone (PACERONE) 200 MG tablet TAKE 1 TABLET DAILY 90 tablet 0   apixaban (ELIQUIS) 2.5 MG TABS tablet Take 1 tablet (2.5 mg total) by mouth 2 (two) times daily. Overdue for labwork, MUST have labwork at upcoming OV with MD for FUTURE refills. 180 tablet 0   atorvastatin (LIPITOR) 20 MG tablet TAKE 1 TABLET DAILY AT 6:00IN THE EVENING 90 tablet 0   bacitracin ointment Apply 1  application topically 2 (two) times daily. 120 g 0   calcium-vitamin D (OSCAL WITH D) 500-200 MG-UNIT per tablet Take 1 tablet by mouth daily.      cholecalciferol (VITAMIN D) 1000 UNITS tablet Take 1,000 Units by mouth daily.     esomeprazole (NEXIUM) 40 MG capsule Take 1 capsule (40 mg total) by mouth daily at 12 noon. 30 capsule 0   furosemide (LASIX) 20 MG tablet Take 1 tablet (20 mg total) by mouth every other day. 45 tablet 1   isosorbide mononitrate (IMDUR) 60 MG 24 hr tablet TAKE 1 TABLET DAILY 90 tablet 0   Misc. Devices (TRANSPORT CHAIR) MISC Use as directed three times daily 1 each 0   polyethylene glycol (MIRALAX / GLYCOLAX) 17 g packet Take 17 g by mouth daily.     senna-docusate (SENOKOT-S) 8.6-50 MG tablet Take 1 tablet by mouth 2 (two) times daily. For Constipation     solifenacin (VESICARE) 10 MG tablet Take 1 tablet (10 mg total) by mouth daily. 30 tablet 0   SYNTHROID 125 MCG tablet TAKE 1/2 TABLET DAILY. 45 tablet 3   vitamin B-12 (CYANOCOBALAMIN) 1000 MCG tablet Take 1 tablet (1,000 mcg total) by mouth daily.     No current facility-administered medications for this visit.    Allergies:   Amoxicillin-pot clavulanate, Penicillins, and Sulfonamide derivatives   Social History:  The patient  reports that she has quit smoking. Her smoking use included cigarettes. She has a 5.00 pack-year smoking history. She has never used smokeless tobacco. She reports that she does not drink alcohol and does not use drugs.   Family History:  The patient's family history includes Cancer in her brother, brother, and daughter; Coronary artery disease in her son; Diabetes in her brother and son; Heart attack in her brother, mother, and sister; Kidney disease in her son; Stroke in her son.  ROS:  Please see the history of present illness.    All other systems are reviewed and otherwise negative.   PHYSICAL EXAM:  VS:  There were no vitals taken for this visit. BMI: There is no height or weight  on file to calculate BMI. Well nourished, well developed, in no acute distress HEENT: normocephalic, atraumatic Neck: no JVD, carotid bruits or masses Cardiac:  *** RRR; no significant murmurs, no rubs, or gallops Lungs:  *** CTA b/l, no wheezing, rhonchi or rales Abd: soft, nontender MS: no deformity or *** atrophy Ext: *** no edema Skin: warm and dry, no rash Neuro:  No gross deficits appreciated Psych: euthymic mood, full affect   EKG:  Done today and reviewed by myself shows  ***   11/28/2016: TTE Study Conclusions  - Left ventricle: The cavity size was normal. Wall thickness was    increased in a pattern of moderate LVH. Systolic function was    normal.  The estimated ejection fraction was in the range of 55%    to 60%. Wall motion was normal; there were no regional wall    motion abnormalities. Doppler parameters are consistent with a    reversible restrictive pattern, indicative of decreased left    ventricular diastolic compliance and/or increased left atrial    pressure (grade 3 diastolic dysfunction).  - Aortic valve: Mildly calcified annulus. Trileaflet; normal    thickness leaflets.  - Mitral valve: Moderately calcified annulus.  - Left atrium: The atrium was mildly to moderately dilated.  - Right atrium: The atrium was mildly dilated.  - Pulmonary arteries: Systolic pressure was mildly increased. PA    peak pressure: 39 mm Hg (S).    Recent Labs: No results found for requested labs within last 8760 hours.  No results found for requested labs within last 8760 hours.   CrCl cannot be calculated (Patient's most recent lab result is older than the maximum 21 days allowed.).   Wt Readings from Last 3 Encounters:  10/25/19 107 lb (48.5 kg)  08/23/19 109 lb 1.6 oz (49.5 kg)  07/23/19 109 lb 1.6 oz (49.5 kg)     Other studies reviewed: Additional studies/records reviewed today include: summarized above  ASSESSMENT AND PLAN:  AFlutter (atypical) CHA2DS2Vasc is  5, on Eliquis, *** appropriately dosed Chronic amidarone ***  2. CM (presumed NICM) Revivered LVEF by last echo *** volume *** update echo    Disposition: F/u with ***  Current medicines are reviewed at length with the patient today.  The patient did not have any concerns regarding medicines.  Venetia Night, PA-C 12/08/2020 10:11 AM     [facility] [place] [address] [phone] (office)  [phone] (fax)

## 2020-12-12 ENCOUNTER — Encounter: Payer: Self-pay | Admitting: Physician Assistant

## 2020-12-12 NOTE — Telephone Encounter (Signed)
Error

## 2020-12-17 ENCOUNTER — Ambulatory Visit: Payer: Medicare Other | Admitting: Physician Assistant

## 2020-12-23 ENCOUNTER — Other Ambulatory Visit: Payer: Self-pay

## 2020-12-23 ENCOUNTER — Encounter: Payer: Self-pay | Admitting: Dermatology

## 2020-12-23 ENCOUNTER — Ambulatory Visit (INDEPENDENT_AMBULATORY_CARE_PROVIDER_SITE_OTHER): Payer: Medicare Other | Admitting: Dermatology

## 2020-12-23 DIAGNOSIS — D485 Neoplasm of uncertain behavior of skin: Secondary | ICD-10-CM

## 2020-12-23 DIAGNOSIS — C44629 Squamous cell carcinoma of skin of left upper limb, including shoulder: Secondary | ICD-10-CM | POA: Diagnosis not present

## 2020-12-23 DIAGNOSIS — L570 Actinic keratosis: Secondary | ICD-10-CM

## 2020-12-23 NOTE — Patient Instructions (Signed)

## 2020-12-28 ENCOUNTER — Emergency Department (HOSPITAL_COMMUNITY): Payer: Medicare Other

## 2020-12-28 ENCOUNTER — Encounter (HOSPITAL_COMMUNITY): Payer: Self-pay | Admitting: Emergency Medicine

## 2020-12-28 ENCOUNTER — Emergency Department (HOSPITAL_COMMUNITY)
Admission: EM | Admit: 2020-12-28 | Discharge: 2020-12-28 | Disposition: A | Discharge status: Home / Self Care | Payer: Medicare Other | Attending: Emergency Medicine | Admitting: Emergency Medicine

## 2020-12-28 ENCOUNTER — Other Ambulatory Visit: Payer: Self-pay

## 2020-12-28 DIAGNOSIS — S0101XA Laceration without foreign body of scalp, initial encounter: Secondary | ICD-10-CM | POA: Insufficient documentation

## 2020-12-28 DIAGNOSIS — I2510 Atherosclerotic heart disease of native coronary artery without angina pectoris: Secondary | ICD-10-CM | POA: Insufficient documentation

## 2020-12-28 DIAGNOSIS — S0990XA Unspecified injury of head, initial encounter: Secondary | ICD-10-CM | POA: Diagnosis present

## 2020-12-28 DIAGNOSIS — W010XXA Fall on same level from slipping, tripping and stumbling without subsequent striking against object, initial encounter: Secondary | ICD-10-CM | POA: Diagnosis not present

## 2020-12-28 DIAGNOSIS — Z23 Encounter for immunization: Secondary | ICD-10-CM | POA: Diagnosis not present

## 2020-12-28 DIAGNOSIS — Z96651 Presence of right artificial knee joint: Secondary | ICD-10-CM | POA: Insufficient documentation

## 2020-12-28 DIAGNOSIS — Y92002 Bathroom of unspecified non-institutional (private) residence single-family (private) house as the place of occurrence of the external cause: Secondary | ICD-10-CM | POA: Diagnosis not present

## 2020-12-28 DIAGNOSIS — Z79899 Other long term (current) drug therapy: Secondary | ICD-10-CM | POA: Insufficient documentation

## 2020-12-28 DIAGNOSIS — E039 Hypothyroidism, unspecified: Secondary | ICD-10-CM | POA: Insufficient documentation

## 2020-12-28 DIAGNOSIS — Z87891 Personal history of nicotine dependence: Secondary | ICD-10-CM | POA: Diagnosis not present

## 2020-12-28 DIAGNOSIS — I5042 Chronic combined systolic (congestive) and diastolic (congestive) heart failure: Secondary | ICD-10-CM | POA: Insufficient documentation

## 2020-12-28 DIAGNOSIS — Z7901 Long term (current) use of anticoagulants: Secondary | ICD-10-CM | POA: Diagnosis not present

## 2020-12-28 DIAGNOSIS — I110 Hypertensive heart disease with heart failure: Secondary | ICD-10-CM | POA: Diagnosis not present

## 2020-12-28 DIAGNOSIS — W19XXXA Unspecified fall, initial encounter: Secondary | ICD-10-CM

## 2020-12-28 LAB — COMPREHENSIVE METABOLIC PANEL
ALT: 17 U/L (ref 0–44)
AST: 22 U/L (ref 15–41)
Albumin: 3.5 g/dL (ref 3.5–5.0)
Alkaline Phosphatase: 52 U/L (ref 38–126)
Anion gap: 8 (ref 5–15)
BUN: 14 mg/dL (ref 8–23)
CO2: 26 mmol/L (ref 22–32)
Calcium: 9.2 mg/dL (ref 8.9–10.3)
Chloride: 102 mmol/L (ref 98–111)
Creatinine, Ser: 1.26 mg/dL — ABNORMAL HIGH (ref 0.44–1.00)
GFR, Estimated: 40 mL/min — ABNORMAL LOW (ref >60)
Glucose, Bld: 89 mg/dL (ref 70–99)
Potassium: 4.1 mmol/L (ref 3.5–5.1)
Sodium: 136 mmol/L (ref 135–145)
Total Bilirubin: 0.9 mg/dL (ref 0.3–1.2)
Total Protein: 6.1 g/dL — ABNORMAL LOW (ref 6.5–8.1)

## 2020-12-28 LAB — CBC
HCT: 36.4 % (ref 36.0–46.0)
Hemoglobin: 11.9 g/dL — ABNORMAL LOW (ref 12.0–15.0)
MCH: 30.4 pg (ref 26.0–34.0)
MCHC: 32.7 g/dL (ref 30.0–36.0)
MCV: 92.9 fL (ref 80.0–100.0)
Platelets: 166 10*3/uL (ref 150–400)
RBC: 3.92 MIL/uL (ref 3.87–5.11)
RDW: 15.3 % (ref 11.5–15.5)
WBC: 10.7 10*3/uL — ABNORMAL HIGH (ref 4.0–10.5)
nRBC: 0 % (ref 0.0–0.2)

## 2020-12-28 LAB — URINALYSIS, ROUTINE W REFLEX MICROSCOPIC
Bacteria, UA: NONE SEEN
Bilirubin Urine: NEGATIVE
Glucose, UA: NEGATIVE mg/dL
Ketones, ur: NEGATIVE mg/dL
Leukocytes,Ua: NEGATIVE
Nitrite: NEGATIVE
Protein, ur: NEGATIVE mg/dL
Specific Gravity, Urine: 1.005 (ref 1.005–1.030)
pH: 6 (ref 5.0–8.0)

## 2020-12-28 LAB — PROTIME-INR
INR: 1.5 — ABNORMAL HIGH (ref 0.8–1.2)
Prothrombin Time: 18.1 seconds — ABNORMAL HIGH (ref 11.4–15.2)

## 2020-12-28 MED ORDER — ACETAMINOPHEN 325 MG PO TABS
650.0000 mg | ORAL_TABLET | Freq: Once | ORAL | Status: AC
  Administered 2020-12-28: 650 mg via ORAL
  Filled 2020-12-28: qty 2

## 2020-12-28 MED ORDER — LIDOCAINE-EPINEPHRINE-TETRACAINE (LET) TOPICAL GEL
3.0000 mL | Freq: Once | TOPICAL | Status: DC
  Filled 2020-12-28: qty 3

## 2020-12-28 MED ORDER — TETANUS-DIPHTH-ACELL PERTUSSIS 5-2.5-18.5 LF-MCG/0.5 IM SUSY
0.5000 mL | PREFILLED_SYRINGE | Freq: Once | INTRAMUSCULAR | Status: AC
  Administered 2020-12-28: 0.5 mL via INTRAMUSCULAR
  Filled 2020-12-28: qty 0.5

## 2020-12-28 MED ORDER — LIDOCAINE-EPINEPHRINE (PF) 2 %-1:200000 IJ SOLN
10.0000 mL | Freq: Once | INTRAMUSCULAR | Status: DC
  Filled 2020-12-28: qty 20

## 2020-12-28 NOTE — ED Triage Notes (Signed)
Pt arrives POV for fall at home. Level 2 trauma activated. Pt lives alone and was ambulating in bathroom with a broom (instead of her walker), had a mechanical fall onto R side, hit head on tile. Pt is on eliquis for hx afib. Pt GCS 14, baseline. Pt has lac to R head, c/o R shoulder, R elbow, R hand, and R knee pain. Pt ambulatory w/ assistance upon arrival to ED.

## 2020-12-28 NOTE — Progress Notes (Signed)
This chaplain responded to Level 2 in Trauma C. The Pt. is awake and talking to the chaplain.  The Pt. daughter in law is at the bedside.  The chaplain listened as the Pt. described the fall in the bathroom and the "blood" from her head. The chaplain prayed with the Pt. as the medical team prepared for x-rays.    Chaplain Sallyanne Kuster [phone]

## 2020-12-28 NOTE — ED Notes (Signed)
Pt returned from CT °

## 2020-12-28 NOTE — ED Notes (Signed)
Pt back from x-ray.

## 2020-12-28 NOTE — ED Provider Notes (Signed)
[facility] EMERGENCY DEPARTMENT Provider Note   CSN: [id number] Arrival date & time: 12/28/20  1358     History Chief Complaint  Patient presents with   Cathy [name] is a 87 y.o. female.   Fall Associated symptoms include headaches. Pertinent negatives include no chest pain, no abdominal pain and no shortness of breath. Patient is a highly functional 87 year old female presenting after a fall at home.  She does remember the fall.  She describes as mechanical.  She states that she tripped while in the bathroom.  She struck the right side of her head.  She denies LOC.  She is on Eliquis for A. fib.  She was able to crawl across the floor to the telephone to call her daughter.  When her daughter arrived, she describes a large amount of blood in a trail from the bathroom to the phone.  Patient refused to let her daughter called EMS.  Instead, patient's daughter brought her to the ED via POV.  During this ride, patient was complaining of pain to areas of the right side of her body: Shoulder, wrist, hand, and knee.  She has been ambulatory since the fall.  Patient reports her last dose of Eliquis was this morning.  Currently, patient continues to endorse pain in these right-sided areas.  She denies any other symptoms at this time.     Past Medical History:  Diagnosis Date   Allergic rhinitis    Anemia    Anxiety    Chronic systolic CHF (congestive heart failure) ([place])    a. 2D Echo 02/02/15: EF 25-30%, akinesis of mid-apical anteroseptal myocardium, grade 3 DD, mod MR, severely dilated LA, mildly reduced RV systoluc function, mod dilated RA, mod TR, mildly increased PASP 54mmHg. b. 10/2016: EF 55-60% with no regional WMA. Grade 3 DD.    CLOSTRIDIUM DIFFICILE COLITIS 05/26/2009   Qualifier: Diagnosis of  By: Nils Pyle CMA (AAMA), Leisha     Colitis    lymphocytic colitis feb 2011   Coronary artery calcification    a. By CT 01/2015.   Diverticulosis of colon     DJD (degenerative joint disease)    gets epidural injections   Dysphagia    Hiatal hernia    Hypercholesteremia    Hypertension    Iron deficiency    Irritable bowel syndrome    Liver lesion 02/18/2015   Lumbar back pain    Osteoporosis    Paroxysmal atrial flutter ([place])    a. Dx 01/2015 - underwent DCCV 03/2015; takes Eliquis   Sinus bradycardia    Splenic lesion 02/18/2015   Squamous cell carcinoma in situ    Urinary incontinence    Venous insufficiency    Vitamin B12 deficiency     Patient Active Problem List   Diagnosis Date Noted   Primary osteoarthritis of both hands 07/06/2019   Hip fracture ([place]) 06/29/2019   Closed fracture of neck of left femur ([place]) 06/28/2019   GERD without esophagitis 06/28/2019   Noninfected skin tear of leg, right, initial encounter 04/19/2018   Bowel obstruction ([place]) 02/21/2018   Hypothyroidism 02/21/2018   Osteoarthritis of left shoulder 08/10/2017   Pain in joint of left shoulder 08/10/2017   Mass of joint of left wrist 06/22/2017   Arthritis of left wrist 06/21/2017   Carpal tunnel syndrome 06/21/2017   Tenosynovitis of left wrist 06/21/2017   Fracture of distal end of humerus 03/25/2017   Closed supracondylar fracture of left humerus  01/15/2017   Gait disorder 01/15/2017   Recurrent falls 01/15/2017   Rib fractures 11/28/2016   Skin lesion 09/30/2016   Ganglion cyst of dorsum of left wrist 09/30/2016   Rotator cuff arthropathy, right 04/26/2016   Hyperglycemia 04/01/2016   Right arm pain 04/01/2016   Wrist swelling 09/30/2015   Preventative health care 04/02/2015   Chronic combined systolic and diastolic CHF (congestive heart failure) ([place]) 03/17/2015   Coronary artery calcification seen on CT scan 03/17/2015   Sinus bradycardia 03/17/2015   Splenic lesion 02/18/2015   Liver lesion 02/18/2015   Coronary artery disease involving native coronary artery of native heart without angina pectoris 02/03/2015   Atrial fibrillation and  flutter ([place]) 02/02/2015   Fatigue 01/31/2015   Abnormal CT of the abdomen 01/31/2015   Prolonged Q-T interval on ECG 01/31/2015   Hepatic lesion    Chronic constipation 12/03/2014   Lower extremity edema 04/25/2014   Peripheral neuropathy ([place]) 12/10/2013   Overactive bladder 12/27/2011   Senile osteoporosis 12/27/2011   Vitamin B12 deficiency 05/03/2010   Iron deficiency anemia 05/03/2010   Normocytic anemia 05/03/2010   VENOUS INSUFFICIENCY 01/27/2009   BACK PAIN, LUMBAR 01/27/2008   URINARY INCONTINENCE 08/23/2007   DIVERTICULOSIS OF COLON 04/10/2007   HYPERCHOLESTEROLEMIA 02/13/2007   ANXIETY 02/13/2007   Essential hypertension 02/13/2007   HIATAL HERNIA 02/13/2007   Irritable bowel syndrome 02/13/2007   DEGENERATIVE JOINT DISEASE 02/13/2007   Allergic rhinitis 02/13/2007    Past Surgical History:  Procedure Laterality Date   ABDOMINAL HYSTERECTOMY     CARDIOVERSION N/A 02/03/2015   Procedure: CARDIOVERSION;  Surgeon: Sueanne Margarita, MD;  Location: [facility];  Service: Cardiovascular;  Laterality: N/A;   CARDIOVERSION N/A 03/10/2015   Procedure: CARDIOVERSION;  Surgeon: Dorothy Spark, MD;  Location: [place];  Service: Cardiovascular;  Laterality: N/A;   CARDIOVERSION N/A 01/07/2017   Procedure: CARDIOVERSION;  Surgeon: Pixie Casino, MD;  Location: [facility] ENDOSCOPY;  Service: Cardiovascular;  Laterality: N/A;   cataract surgery     decompressive lumbar laminectomy  06/2008   at L2-3, L3-4 and L4-5 by Dr. Ronnald Ramp   HIP PINNING,CANNULATED Left 06/30/2019   Procedure: CANNULATED LEFT HIP PINNING;  Surgeon: Mcarthur Rossetti, MD;  Location: [facility];  Service: Orthopedics;  Laterality: Left;   LAPAROSCOPIC CHOLECYSTECTOMY  04/2009   Dr. Abran Cantor   REPLACEMENT TOTAL KNEE     TEE WITHOUT CARDIOVERSION N/A 02/03/2015   Procedure: TRANSESOPHAGEAL ECHOCARDIOGRAM (TEE);  Surgeon: Sueanne Margarita, MD;  [facility] END[facility]COPY;  Service: Cardiovascular;  Laterality: N/A;      OB History   No obstetric history on file.     Family History  Problem Relation Age of Onset   Cancer Brother        lung   Diabetes Son    Coronary artery disease Son    Kidney disease Son    Stroke Son    Heart attack Mother    Cancer Brother        colon   Cancer Daughter        cervical, lung    Heart attack Brother    Diabetes Brother    Heart attack Sister    Hypertension Neg Hx    Fainting Neg Hx     Social History   Tobacco Use   Smoking status: Former    Packs/day: 0.50    Years: 10.00    Pack years: 5.00    Types: Cigarettes   Smokeless tobacco: Never  Vaping Use   Vaping Use: Never used  Substance Use Topics   Alcohol use: No   Drug use: Never    Home Medications Prior to Admission medications   Medication Sig Start Date End Date Taking? Authorizing Provider  acetaminophen (TYLENOL) 325 MG tablet Take 650 mg by mouth every 8 (eight) hours.    [provider]  amiodarone (PACERONE) 200 MG tablet TAKE 1 TABLET DAILY 10/28/20   Camnitz, Ocie Doyne, MD  apixaban (ELIQUIS) 2.5 MG TABS tablet Take 1 tablet (2.5 mg total) by mouth 2 (two) times daily. Overdue for labwork, MUST have labwork at upcoming OV with MD for FUTURE refills. 10/27/20   Camnitz, Will Hassell Done, MD  atorvastatin (LIPITOR) 20 MG tablet TAKE 1 TABLET DAILY AT 6:00IN THE EVENING 10/28/20   Camnitz, Ocie Doyne, MD  bacitracin ointment Apply 1 application topically 2 (two) times daily. 10/23/20   Lamptey, Myrene Galas, MD  calcium-vitamin D (OSCAL WITH D) 500-200 MG-UNIT per tablet Take 1 tablet by mouth daily.     [provider]  cholecalciferol (VITAMIN D) 1000 UNITS tablet Take 1,000 Units by mouth daily.    [provider]  esomeprazole (NEXIUM) 40 MG capsule Take 1 capsule (40 mg total) by mouth daily at 12 noon. 07/23/19   Granville Lewis C, PA-C  furosemide (LASIX) 20 MG tablet Take 1 tablet (20 mg total) by mouth every other day. 08/26/20   Biagio Borg, MD   isosorbide mononitrate (IMDUR) 60 MG 24 hr tablet TAKE 1 TABLET DAILY 10/28/20   Constance Haw, MD  Misc. Devices [facility]) MISC Use as directed three times daily 08/15/19   Biagio Borg, MD  polyethylene glycol (MIRALAX / GLYCOLAX) 17 g packet Take 17 g by mouth daily. 07/03/19   [provider]  senna-docusate (SENOKOT-S) 8.6-50 MG tablet Take 1 tablet by mouth 2 (two) times daily. For Constipation    [provider]  solifenacin (VESICARE) 10 MG tablet Take 1 tablet (10 mg total) by mouth daily. 07/23/19   Granville Lewis C, PA-C  SYNTHROID 125 MCG tablet TAKE 1/2 TABLET DAILY. 04/30/20   Biagio Borg, MD  vitamin B-12 (CYANOCOBALAMIN) 1000 MCG tablet Take 1 tablet (1,000 mcg total) by mouth daily. 06/17/14   Rowe Clack, MD    Allergies    Amoxicillin-pot clavulanate, Penicillins, and Sulfonamide derivatives  Review of Systems   Review of Systems  Constitutional:  Negative for appetite change, chills, fatigue and fever.  HENT:  Negative for congestion, ear pain and sore throat.   Eyes:  Negative for pain and visual disturbance.  Respiratory:  Negative for cough, chest tightness and shortness of breath.   Cardiovascular:  Positive for leg swelling (R > L, chronic). Negative for chest pain and palpitations.  Gastrointestinal:  Negative for abdominal pain, nausea and vomiting.  Genitourinary:  Negative for dysuria, flank pain and hematuria.  Musculoskeletal:  Positive for arthralgias. Negative for back pain, gait problem, joint swelling and neck pain.  Skin:  Positive for wound. Negative for color change, pallor and rash.  Neurological:  Positive for headaches. Negative for dizziness, seizures, syncope, speech difficulty, weakness, light-headedness and numbness.  Hematological:  Bruises/bleeds easily (On Eliquis).  Psychiatric/Behavioral:  Negative for confusion and decreased concentration.   All other systems reviewed and are negative.  Physical  Exam Updated Vital Signs BP 131/61   Pulse (!) 51   Temp 98 F (36.7 C) (Oral)   Resp 11   Ht  4\' 11"  (1.499 m)   Wt 47.6 kg   SpO2 96%   BMI 21.21 kg/m   Physical Exam Vitals and nursing note reviewed.  Constitutional:      General: She is not in acute distress.    Appearance: Normal appearance. She is well-developed and normal weight. She is not ill-appearing, toxic-appearing or diaphoretic.  HENT:     Head:     Comments: Hemostatic wound to right parietal scalp with underlying hematoma    Right Ear: External ear normal.     Left Ear: External ear normal.     Nose: Nose normal.     Mouth/Throat:     Mouth: Mucous membranes are moist.     Pharynx: Oropharynx is clear.  Eyes:     Extraocular Movements: Extraocular movements intact.     Conjunctiva/sclera: Conjunctivae normal.  Cardiovascular:     Rate and Rhythm: Bradycardia present. Rhythm irregular.     Heart sounds: No murmur heard. Pulmonary:     Effort: Pulmonary effort is normal. No respiratory distress.     Breath sounds: Normal breath sounds. No wheezing or rales.  Chest:     Chest wall: No tenderness.  Abdominal:     Palpations: Abdomen is soft.     Tenderness: There is no abdominal tenderness.  Musculoskeletal:        General: No swelling, tenderness or deformity.     Cervical back: Neck supple. No tenderness.     Right lower leg: Edema present.     Left lower leg: Edema present.  Skin:    General: Skin is warm and dry.     Coloration: Skin is not jaundiced or pale.  Neurological:     General: No focal deficit present.     Mental Status: She is alert and oriented to person, place, and time.     Cranial Nerves: No cranial nerve deficit.     Sensory: No sensory deficit.     Motor: No weakness.  Psychiatric:        Mood and Affect: Mood normal.        Behavior: Behavior normal.        Thought Content: Thought content normal.        Judgment: Judgment normal.    ED Results / Procedures / Treatments    Labs (all labs ordered are listed, but only abnormal results are displayed) Labs Reviewed  COMPREHENSIVE METABOLIC PANEL - Abnormal; Notable for the following components:      Result Value   Creatinine, Ser 1.26 (*)    Total Protein 6.1 (*)    GFR, Estimated 40 (*)    All other components within normal limits  CBC - Abnormal; Notable for the following components:   WBC 10.7 (*)    Hemoglobin 11.9 (*)    All other components within normal limits  URINALYSIS, ROUTINE W REFLEX MICROSCOPIC - Abnormal; Notable for the following components:   Color, Urine COLORLESS (*)    Hgb urine dipstick SMALL (*)    All other components within normal limits  PROTIME-INR - Abnormal; Notable for the following components:   Prothrombin Time 18.1 (*)    INR 1.5 (*)    All other components within normal limits    EKG EKG Interpretation  Date/Time:  Sunday December 28 2020 14:51:12 EDT Ventricular Rate:  53 PR Interval:    QRS Duration: 150 QT Interval:  535 QTC Calculation: 503 R Axis:   -73 Text Interpretation: Atrial fibrillation IVCD, consider atypical  RBBB LVH with secondary repolarization abnormality Confirmed by Godfrey Pick [phone]) on 12/28/2020 3:44:06 PM  Radiology DG Shoulder Right  Result Date: 12/28/2020 CLINICAL DATA:  [place].  Right shoulder pain. EXAM: RIGHT SHOULDER - 2+ VIEW COMPARISON:  None. FINDINGS: Advanced glenohumeral joint degenerative changes with joint space narrowing and spurring. Moderate AC joint degenerative changes. No acute fracture is identified. On the scapular Y-view the patient's humeral head is projecting posteriorly. This could be positional but recommend a axillary view to exclude a posterior dislocation The visualized right ribs are intact IMPRESSION: 1. Advanced glenohumeral joint degenerative changes but no definite acute fracture. 2. The patient's humeral head is projecting posteriorly in relation to the glenoid this could be projectional but recommend an  axillary view to exclude a posterior dislocation. Electronically Signed   By: Marijo Sanes M.D.   On: 12/28/2020 15:48   DG Wrist 2 Views Right  Result Date: 12/28/2020 CLINICAL DATA:  [place].  Right wrist pain. EXAM: RIGHT WRIST - 2 VIEW COMPARISON:  11/19/2015 FINDINGS: Advanced and progressive degenerative changes involving the wrist and the carpometacarpal joint of the thumb. No acute fracture is identified. IMPRESSION: 1. No acute wrist fracture. 2. Advanced and progressive degenerative changes. Electronically Signed   By: Marijo Sanes M.D.   On: 12/28/2020 15:44   CT HEAD WO CONTRAST  Result Date: 12/28/2020 CLINICAL DATA:  [place].  Hit head. EXAM: CT HEAD WITHOUT CONTRAST CT CERVICAL SPINE WITHOUT CONTRAST TECHNIQUE: Multidetector CT imaging of the head and cervical spine was performed following the standard protocol without intravenous contrast. Multiplanar CT image reconstructions of the cervical spine were also generated. COMPARISON:  Head CT 03/01/2018 FINDINGS: CT HEAD FINDINGS Brain: Stable age related cerebral atrophy, ventriculomegaly and periventricular white matter disease. No extra-axial fluid collections are identified. No CT findings for acute hemispheric infarction or intracranial hemorrhage. No mass lesions. The brainstem and cerebellum are normal. Vascular: Stable vascular calcifications. No aneurysm or hyperdense vessels. Skull: No skull fracture or bone lesions. Sinuses/Orbits: The paranasal sinuses and mastoid air cells are clear. The globes are intact. Other: Right high parietal scalp hematoma without underlying fracture. CT CERVICAL SPINE FINDINGS Alignment: Moderate degenerative anterior subluxation of C7 compared to T1 due to advanced facet disease. The other cervical vertebral bodies are grossly normally aligned. The facets are normally aligned. Skull base and vertebrae: Age related degenerative changes but no acute cervical spine fracture. Soft tissues and spinal canal: No  prevertebral fluid or swelling. No visible canal hematoma. Disc levels: The spinal canal is fairly generous. No significant spinal stenosis. Mild multilevel foraminal stenosis due to uncinate spurring and facet disease. Upper chest: The lung apices are grossly clear. Other: Bilateral carotid artery calcifications. IMPRESSION: 1. Stable age related cerebral atrophy, ventriculomegaly and periventricular white matter disease. 2. No acute intracranial findings or skull fracture. 3. Right high parietal scalp hematoma without underlying fracture. 4. Degenerative cervical spondylosis with multilevel disc disease and facet disease but no acute cervical spine fracture. Electronically Signed   By: Marijo Sanes M.D.   On: 12/28/2020 15:25   CT CERVICAL SPINE WO CONTRAST  Result Date: 12/28/2020 CLINICAL DATA:  [place].  Hit head. EXAM: CT HEAD WITHOUT CONTRAST CT CERVICAL SPINE WITHOUT CONTRAST TECHNIQUE: Multidetector CT imaging of the head and cervical spine was performed following the standard protocol without intravenous contrast. Multiplanar CT image reconstructions of the cervical spine were also generated. COMPARISON:  Head CT 03/01/2018 FINDINGS: CT HEAD FINDINGS Brain: Stable age related cerebral atrophy, ventriculomegaly and periventricular  white matter disease. No extra-axial fluid collections are identified. No CT findings for acute hemispheric infarction or intracranial hemorrhage. No mass lesions. The brainstem and cerebellum are normal. Vascular: Stable vascular calcifications. No aneurysm or hyperdense vessels. Skull: No skull fracture or bone lesions. Sinuses/Orbits: The paranasal sinuses and mastoid air cells are clear. The globes are intact. Other: Right high parietal scalp hematoma without underlying fracture. CT CERVICAL SPINE FINDINGS Alignment: Moderate degenerative anterior subluxation of C7 compared to T1 due to advanced facet disease. The other cervical vertebral bodies are grossly normally  aligned. The facets are normally aligned. Skull base and vertebrae: Age related degenerative changes but no acute cervical spine fracture. Soft tissues and spinal canal: No prevertebral fluid or swelling. No visible canal hematoma. Disc levels: The spinal canal is fairly generous. No significant spinal stenosis. Mild multilevel foraminal stenosis due to uncinate spurring and facet disease. Upper chest: The lung apices are grossly clear. Other: Bilateral carotid artery calcifications. IMPRESSION: 1. Stable age related cerebral atrophy, ventriculomegaly and periventricular white matter disease. 2. No acute intracranial findings or skull fracture. 3. Right high parietal scalp hematoma without underlying fracture. 4. Degenerative cervical spondylosis with multilevel disc disease and facet disease but no acute cervical spine fracture. Electronically Signed   By: Marijo Sanes M.D.   On: 12/28/2020 15:25   DG Pelvis Portable  Result Date: 12/28/2020 CLINICAL DATA:  Trauma, fall EXAM: PORTABLE PELVIS 1-2 VIEWS COMPARISON:  06/30/2019 FINDINGS: There is previous internal fixation in the proximal left femur. No definite recent displaced fracture is seen. There is no dislocation. Marked degenerative changes are noted in the visualized lower lumbar spine. Osteopenia is seen in bony structures. Arterial calcifications are seen in soft tissues. IMPRESSION: Previous internal fixation in the left femoral neck. No recent displaced fracture or dislocation is seen. Lumbar spondylosis. Electronically Signed   By: Elmer Picker M.D.   On: 12/28/2020 15:07   DG Hand 2 View Right  Result Date: 12/28/2020 CLINICAL DATA:  [place]. EXAM: RIGHT HAND - 2 VIEW COMPARISON:  Radiographs from 2017 FINDINGS: Advanced and progressive degenerative changes involving the hand and wrist. No acute hand fractures are identified. IMPRESSION: 1. Advanced and progressive degenerative changes involving the hand and wrist. 2. No acute fracture.  Electronically Signed   By: Marijo Sanes M.D.   On: 12/28/2020 15:45   DG Chest Port 1 View  Result Date: 12/28/2020 CLINICAL DATA:  87 year old female with fall.  On blood thinners. EXAM: PORTABLE CHEST 1 VIEW COMPARISON:  06/28/2019 and prior radiographs FINDINGS: Cardiomegaly and very large hiatal hernia again noted. There is no evidence of focal airspace disease, pulmonary edema, suspicious pulmonary nodule/mass, pleural effusion, or pneumothorax. No acute bony abnormalities are identified. IMPRESSION: No evidence of acute abnormality. Cardiomegaly and very large hiatal hernia. Electronically Signed   By: Margarette Canada M.D.   On: 12/28/2020 15:05   DG Knee Complete 4 Views Right  Result Date: 12/28/2020 CLINICAL DATA:  [place].  Right knee pain. EXAM: RIGHT KNEE - COMPLETE 4+ VIEW COMPARISON:  Radiographs 11/19/2015 FINDINGS: The total knee arthroplasty components are well seated. No complicating features. No periprosthetic fracture. No joint effusion. Vascular calcifications noted. IMPRESSION: 1. No acute bony findings or joint effusion. 2. Well seated components of a total knee arthroplasty. Electronically Signed   By: Marijo Sanes M.D.   On: 12/28/2020 15:49    Procedures .Marland KitchenLaceration Repair  Date/Time: 12/28/2020 3:00 PM Performed by: Godfrey Pick, MD Authorized by: Godfrey Pick, MD   Consent:  Consent obtained:  Verbal   Consent given by:  Patient   Risks, benefits, and alternatives were discussed: yes     Alternatives discussed:  No treatment Universal protocol:    Procedure explained and questions answered to patient or proxy's satisfaction: yes     Patient identity confirmed:  Verbally with patient Anesthesia:    Anesthesia method:  Topical application   Topical anesthetic:  LET Laceration details:    Location:  Scalp   Scalp location:  R parietal   Length (cm):  1   Depth (mm):  0.1 Pre-procedure details:    Preparation:  Imaging obtained to evaluate for foreign  bodies Exploration:    Hemostasis achieved with:  Direct pressure   Imaging obtained comment:  CT   Imaging outcome: foreign body not noted     Wound extent comment:  Very superficial macerated skin Treatment:    Area cleansed with:  Saline   Amount of cleaning:  Standard   Irrigation solution:  Sterile saline   Irrigation volume:  100   Visualized foreign bodies/material removed: no   Skin repair:    Repair method: Dermabond. Post-procedure details:    Dressing:  Open (no dressing)   Procedure completion:  Tolerated well, no immediate complications   Medications Ordered in ED Medications  lidocaine-EPINEPHrine-tetracaine (LET) topical gel (3 mLs Topical Not Given 12/28/20 1543)  lidocaine-EPINEPHrine (XYLOCAINE W/EPI) 2 %-1:200000 (PF) injection 10 mL (10 mLs Intradermal Not Given 12/28/20 1542)  Tdap (BOOSTRIX) injection 0.5 mL (0.5 mLs Intramuscular Given 12/28/20 1538)  acetaminophen (TYLENOL) tablet 650 mg (650 mg Oral Given 12/28/20 1537)    ED Course  I have reviewed the triage vital signs and the nursing notes.  Pertinent labs & imaging results that were available during my care of the patient were reviewed by me and considered in my medical decision making (see chart for details).    MDM Rules/Calculators/A&P                          Patient is a 87 year old female who presents after a fall at home.  Given that she is on Eliquis, she arrives as a level 2 trauma.  On exam, she is alert and oriented.  She has a hemostatic wound to her right parietal scalp with an underlying hematoma.  She endorses pain in her right shoulder, wrist, hand, and knee.  No deformities to these areas are appreciated on exam.  Patient has good range of motion and no significant tenderness.  CT scan of head and cervical spine were ordered.  X-ray imaging was ordered of right-sided areas of pain.  A laboratory work-up was initiated as well.  Tylenol was given for analgesia.  Tetanus was updated.  CT  of head was negative for any acute intracranial abnormality.  Remaining imaging studies were negative as well.  Patient's scalp wound was cleansed and found to be a small area of macerated skin only.  Patient and daughter were agreeable to placement of Dermabond to prevent any further oozing.  Patient's lab work was notable for slight increase in creatinine.  This to be managed at home with increased p.o. fluid intake.  Hemoglobin was baseline.  Patient was able to ambulate in the ED.  Patient and daughter were in favor of discharge home.  Patient was discharged in stable condition.  Final Clinical Impression(s) / ED Diagnoses Final diagnoses:  Fall    Rx / DC Orders ED Discharge Orders  None        Godfrey Pick, MD 12/28/20 [phone]

## 2020-12-28 NOTE — Progress Notes (Signed)
Orthopedic Tech Progress Note Patient Details:  Cathy [name] 01/21/1928 [id number]  Level 2 trauma Patient ID: [name], female   DOB: March 29, 1927, 87 y.o.   MRN: [id number]  Carin Primrose 12/28/2020, 4:05 PM

## 2020-12-28 NOTE — ED Notes (Signed)
Patient transported to X-ray 

## 2020-12-28 NOTE — ED Notes (Signed)
Patient transported to CT w/ [facility] RN

## 2020-12-28 NOTE — ED Notes (Signed)
Pt ambulated standby assist with walker.

## 2020-12-29 ENCOUNTER — Telehealth: Payer: Self-pay

## 2020-12-29 NOTE — Telephone Encounter (Signed)
-----   Message from Lavonna Monarch, MD sent at 12/26/2020  5:54 AM EDT ----- Schedule Mohs

## 2020-12-29 NOTE — Telephone Encounter (Signed)
Path to patient and she fell and hit her head yesterday and they will follow up with [place] about the mohs visit. No referral made yet.Cathy [name]

## 2020-12-29 NOTE — Telephone Encounter (Signed)
Rhylan Gross is calling in regards to patient and would like to speak with someone about patient's condition.

## 2020-12-29 NOTE — Telephone Encounter (Signed)
Pathology results to patient daughter Cathy [name]) because she called back with further questions- referral sent to [facility] surgery center for [facility].

## 2021-01-01 NOTE — Progress Notes (Signed)
Cardiology Office Note Date:  01/02/2021  Patient ID:  Cathy, [name] November 27, 1927, MRN [id number] PCP:  Biagio Borg, MD  Electrophysiologist: Dr. Curt Bears    Chief Complaint: overdue 6 mo  History of Present Illness: Cathy [name] is a 87 y.o. female with history of HTN, venous insufficiency, HLD, AFlutter, chronic combined CHF (with recovered LVEF + grade III DD)  She comes in today to be seen for Dr. Curt Bears, last seen by him Aug 2021, she was unaware of her AFlutter, rate controlled, LBBB. Unclear though suspected she was back in AFlutter for a few months, planned to try DCCV at the patient's timing preference (once COVID numbers came down). She has some volume on board and her lasix increased Planned for early follow up though this was the last visit she has had.  ER visit 12/28/20 after a fall, described as trip/fal, no syncope.  She had a scalp wound hematoma > dermabond CT head negative for acute changes, Creat minimally elevated and instructed to hydrate better. Xrays with fractures She was in rate controlled Afib, IVCD (150) Discharged from the ER   TODAY She is with her daughter in law. She is healing up, still bruised up and sore though She reports her Eliquis was not interrupted at the ER, she was not instructed to stop it and has been on. She reports she was in the bathroom, not a lot of room in there with her walker and not entirely sure what/how she got off balance but fell, hitting her R side and head on the tub. She is certain she was not dizzy, did not faint or feel like she was fainting.  She has a vague sens of an ache in her chest, it is a "little bit" and not to often. No pattern, not exertional. Np palpitations She is tired all of the time, wishes she had more energy Denies SOB, just gets tired easy  She has several skin cancer lesions, one on her arm apparnetly rapidly growing with plans for removal soon, presumably will be off her eliquis for this,  they are not sure.   AF hx Diagnosed 2016 with rapid Aflutter Amiodarone started 2016   Past Medical History:  Diagnosis Date   Allergic rhinitis    Anemia    Anxiety    Chronic systolic CHF (congestive heart failure) ([place])    a. 2D Echo 02/02/15: EF 25-30%, akinesis of mid-apical anteroseptal myocardium, grade 3 DD, mod MR, severely dilated LA, mildly reduced RV systoluc function, mod dilated RA, mod TR, mildly increased PASP 86mmHg. b. 10/2016: EF 55-60% with no regional WMA. Grade 3 DD.    CLOSTRIDIUM DIFFICILE COLITIS 05/26/2009   Qualifier: Diagnosis of  By: Nils Pyle CMA (AAMA), Leisha     Colitis    lymphocytic colitis feb 2011   Coronary artery calcification    a. By CT 01/2015.   Diverticulosis of colon    DJD (degenerative joint disease)    gets epidural injections   Dysphagia    Hiatal hernia    Hypercholesteremia    Hypertension    Iron deficiency    Irritable bowel syndrome    Liver lesion 02/18/2015   Lumbar back pain    Osteoporosis    Paroxysmal atrial flutter ([place])    a. Dx 01/2015 - underwent DCCV 03/2015; takes Eliquis   Sinus bradycardia    Splenic lesion 02/18/2015   Squamous cell carcinoma in situ    Urinary incontinence    Venous  insufficiency    Vitamin B12 deficiency     Past Surgical History:  Procedure Laterality Date   ABDOMINAL HYSTERECTOMY     CARDIOVERSION N/A 02/03/2015   Procedure: CARDIOVERSION;  Surgeon: Sueanne Margarita, MD;  Location: [facility];  Service: Cardiovascular;  Laterality: N/A;   CARDIOVERSION N/A 03/10/2015   Procedure: CARDIOVERSION;  Surgeon: Dorothy Spark, MD;  Location: [facility];  Service: Cardiovascular;  Laterality: N/A;   CARDIOVERSION N/A 01/07/2017   Procedure: CARDIOVERSION;  Surgeon: Pixie Casino, MD;  Location: [facility] ENDOSCOPY;  Service: Cardiovascular;  Laterality: N/A;   cataract surgery     decompressive lumbar laminectomy  06/2008   at L2-3, L3-4 and L4-5 by Dr. Ronnald Ramp   HIP PINNING,CANNULATED Left  06/30/2019   Procedure: CANNULATED LEFT HIP PINNING;  Surgeon: Mcarthur Rossetti, MD;  Location: [facility]S;  Service: Orthopedics;  Laterality: Left;   LAPAROSCOPIC CHOLECYSTECTOMY  04/2009   Dr. Abran Cantor   REPLACEMENT TOTAL KNEE     TEE WITHOUT CARDIOVERSION N/A 02/03/2015   Procedure: TRANSESOPHAGEAL ECHOCARDIOGRAM (TEE);  Surgeon: Sueanne Margarita, MD;  Location: [facility] ENDOSCOPY;  Service: Cardiovascular;  Laterality: N/A;    Current Outpatient Medications  Medication Sig Dispense Refill   acetaminophen (TYLENOL) 325 MG tablet Take 650 mg by mouth every 8 (eight) hours.     amiodarone (PACERONE) 200 MG tablet TAKE 1 TABLET DAILY 90 tablet 0   apixaban (ELIQUIS) 2.5 MG TABS tablet Take 1 tablet (2.5 mg total) by mouth 2 (two) times daily. Overdue for labwork, MUST have labwork at upcoming OV with MD for FUTURE refills. 180 tablet 0   atorvastatin (LIPITOR) 20 MG tablet TAKE 1 TABLET DAILY AT 6:00IN THE EVENING 90 tablet 0   bacitracin ointment Apply 1 application topically 2 (two) times daily. 120 g 0   calcium-vitamin D (OSCAL WITH D) 500-200 MG-UNIT per tablet Take 1 tablet by mouth daily.      cholecalciferol (VITAMIN D) 1000 UNITS tablet Take 1,000 Units by mouth daily.     esomeprazole (NEXIUM) 40 MG capsule Take 1 capsule (40 mg total) by mouth daily at 12 noon. 30 capsule 0   furosemide (LASIX) 20 MG tablet Take 1 tablet (20 mg total) by mouth every other day. 45 tablet 1   isosorbide mononitrate (IMDUR) 60 MG 24 hr tablet TAKE 1 TABLET DAILY 90 tablet 0   Misc. Devices (TRANSPORT CHAIR) MISC Use as directed three times daily 1 each 0   polyethylene glycol (MIRALAX / GLYCOLAX) 17 g packet Take 17 g by mouth daily.     senna-docusate (SENOKOT-S) 8.6-50 MG tablet Take 1 tablet by mouth 2 (two) times daily. For Constipation     solifenacin (VESICARE) 10 MG tablet Take 1 tablet (10 mg total) by mouth daily. 30 tablet 0   SYNTHROID 125 MCG tablet TAKE 1/2 TABLET DAILY. 45 tablet 3   vitamin  B-12 (CYANOCOBALAMIN) 1000 MCG tablet Take 1 tablet (1,000 mcg total) by mouth daily.     No current facility-administered medications for this visit.    Allergies:   Amoxicillin-pot clavulanate, Penicillins, and Sulfonamide derivatives   Social History:  The patient  reports that she has quit smoking. Her smoking use included cigarettes. She has a 5.00 pack-year smoking history. She has never used smokeless tobacco. She reports that she does not drink alcohol and does not use drugs.   Family History:  The patient's family history includes Cancer in her brother, brother, and daughter; Coronary artery disease in  her son; Diabetes in her brother and son; Heart attack in her brother, mother, and sister; Kidney disease in her son; Stroke in her son.  ROS:  Please see the history of present illness.    All other systems are reviewed and otherwise negative.   PHYSICAL EXAM:  VS:  BP (!) 120/50   Pulse (!) 51   Ht 4\' 7"  (1.397 m)   Wt 108 lb 3.2 oz (49.1 kg)   SpO2 96%   BMI 25.15 kg/m  BMI: Body mass index is 25.15 kg/m. Well nourished, well developed, in no acute distress HEENT: normocephalic, bruising to R side of her forehead/head, repaired laceration is clean/dry Neck: no JVD, carotid bruits or masses Cardiac:  irreg-irreg; no significant murmurs, no rubs, or gallops Lungs:  CTA b/l, no wheezing, rhonchi or rales Abd: soft, nontender MS: no deformity, age appropriate atrophy, scoliosis Ext: 1+ edema to mid shin (reportedly not new for her), chronic skin changes Skin: warm and dry, no rash Neuro:  No gross deficits appreciated Psych: euthymic mood, full affect   EKG:  Done today and reviewed by myself shows  AFib 53bpm, remains slightly irregular, not CHB, IVCD   11/28/2016: TTE Study Conclusions  - Left ventricle: The cavity size was normal. Wall thickness was    increased in a pattern of moderate LVH. Systolic function was    normal. The estimated ejection fraction was in  the range of 55%    to 60%. Wall motion was normal; there were no regional wall    motion abnormalities. Doppler parameters are consistent with a    reversible restrictive pattern, indicative of decreased left    ventricular diastolic compliance and/or increased left atrial    pressure (grade 3 diastolic dysfunction).  - Aortic valve: Mildly calcified annulus. Trileaflet; normal    thickness leaflets.  - Mitral valve: Moderately calcified annulus.  - Left atrium: The atrium was mildly to moderately dilated.  - Right atrium: The atrium was mildly dilated.  - Pulmonary arteries: Systolic pressure was mildly increased. PA    peak pressure: 39 mm Hg (S).    Recent Labs: 12/28/2020: ALT 17; BUN 14; Creatinine, Ser 1.26; Hemoglobin 11.9; Platelets 166; Potassium 4.1; Sodium 136  No results found for requested labs within last 8760 hours.   Estimated Creatinine Clearance: 18.4 mL/min (A) (by C-G formula based on SCr of 1.26 mg/dL (H)).   Wt Readings from Last 3 Encounters:  01/02/21 108 lb 3.2 oz (49.1 kg)  12/28/20 105 lb (47.6 kg)  10/25/19 107 lb (48.5 kg)     Other studies reviewed: Additional studies/records reviewed today include: summarized above  ASSESSMENT AND PLAN:  AFlutter (atypical) Afib CHA2DS2Vasc is 5, on Eliquis, appropriately dosed Chronic amidarone  She does not fall frequently, last about a year ago  I suspect she is likely always in AFib Discussed stopping amiodarone and not pursuing rhythm control Discussed DCCV to see if she holds SR and how she feels in SR, if any different or better  She and her family will discuss the options, she will get her skin cancer removed Will update her echo  They will let us know if they decide to pursue DCCV (we discussed this procedure, potential risks/benefits)  2. CM (presumed NICM) Revivered LVEF by last echo She has some edema, though no SOB, lungs are clear Discussed doubling lasix for 2-3 days when she has some  swelling  update echo    Disposition: F/u with [place] in 30mo, sooner  if needed.  AFter the patient left, realized I did not order TSH, will ask MA to reach out to them to get TSH done at their convenience in the next week or so.  Current medicines are reviewed at length with the patient today.  The patient did not have any concerns regarding medicines.  Venetia Night, PA-C 01/02/2021 5:35 PM     [address]  [place] [address] [phone] (office)  [phone] (fax)

## 2021-01-02 ENCOUNTER — Ambulatory Visit (INDEPENDENT_AMBULATORY_CARE_PROVIDER_SITE_OTHER): Payer: Medicare Other | Admitting: Physician Assistant

## 2021-01-02 ENCOUNTER — Encounter: Payer: Self-pay | Admitting: Physician Assistant

## 2021-01-02 ENCOUNTER — Other Ambulatory Visit: Payer: Self-pay

## 2021-01-02 VITALS — BP 120/50 | HR 51 | Ht <= 58 in | Wt 108.2 lb

## 2021-01-02 DIAGNOSIS — I5032 Chronic diastolic (congestive) heart failure: Secondary | ICD-10-CM

## 2021-01-02 DIAGNOSIS — I4819 Other persistent atrial fibrillation: Secondary | ICD-10-CM | POA: Diagnosis not present

## 2021-01-02 DIAGNOSIS — I4892 Unspecified atrial flutter: Secondary | ICD-10-CM

## 2021-01-02 NOTE — Patient Instructions (Signed)
Medication Instructions:   Your physician recommends that you continue on your current medications as directed. Please refer to the Current Medication list given to you today.  *If you need a refill on your cardiac medications before your next appointment, please call your pharmacy*   Lab Work: [place]   If you have labs (blood work) drawn today and your tests are completely normal, you will receive your results only by: [place] (if you have MyChart) OR A paper copy in the mail If you have any lab test that is abnormal or we need to change your treatment, we will call you to review the results.   Testing/Procedures:  Your physician has requested that you have an echocardiogram. Echocardiography is a painless test that uses sound waves to create images of your heart. It provides your doctor with information about the size and shape of your heart and how well your heart's chambers and valves are working. This procedure takes approximately one hour. There are no restrictions for this procedure.     Follow-Up: At [facility], you and your health needs are our priority.  As part of our continuing mission to provide you with exceptional heart care, we have created designated Provider Care Teams.  These Care Teams include your primary Cardiologist (physician) and Advanced Practice Providers (APPs -  Physician Assistants and Nurse Practitioners) who all work together to provide you with the care you need, when you need it.  We recommend signing up for the patient portal called "MyChart".  Sign up information is provided on this After Visit Summary.  MyChart is used to connect with patients for Virtual Visits (Telemedicine).  Patients are able to view lab/test results, encounter notes, upcoming appointments, etc.  Non-urgent messages can be sent to your provider as well.   To learn more about what you can do with MyChart, go to [email or web address].    Your next  appointment:   3 month(s)  The format for your next appointment:   In Person  Provider:   You may see Dr. Curt Bears  or one of the following Advanced Practice Providers on your designated Care Team:   Tommye Standard, [place] Legrand Como "Jonni Sanger" Chalmers Cater, [place]     Other Instructions

## 2021-01-05 ENCOUNTER — Other Ambulatory Visit: Payer: Self-pay

## 2021-01-05 ENCOUNTER — Ambulatory Visit (HOSPITAL_COMMUNITY)
Admission: EM | Admit: 2021-01-05 | Discharge: 2021-01-05 | Discharge status: Against Medical Advice | Payer: Medicare Other | Attending: Internal Medicine | Admitting: Internal Medicine

## 2021-01-05 ENCOUNTER — Telehealth: Payer: Self-pay | Admitting: Dermatology

## 2021-01-05 NOTE — Telephone Encounter (Signed)
[phone] the skin surgery center number given to patient referral completed

## 2021-01-05 NOTE — ED Notes (Signed)
Pt laceration was evaluated by Dr Lanny Cramp upon arrival & wrapped by [facility] Lenna Sciara

## 2021-01-05 NOTE — Telephone Encounter (Signed)
We told her to call back if she hasn't heard from Cathy [name]. She hasn't

## 2021-01-05 NOTE — Telephone Encounter (Signed)
Patient's daughter, Dalbert Mayotte, left message on office voice mail that she was calling about her mother's Moh's appointment.  Stanton Kidney states that they still have not heard when the appointment is, so just following up.

## 2021-01-06 ENCOUNTER — Emergency Department (HOSPITAL_COMMUNITY): Payer: Medicare Other

## 2021-01-06 ENCOUNTER — Inpatient Hospital Stay (HOSPITAL_COMMUNITY)
Admission: EM | Admit: 2021-01-06 | Discharge: 2021-01-12 | DRG: 871 | Disposition: A | Discharge status: Skilled Nursing Facility | Payer: Medicare Other | Attending: Family Medicine | Admitting: Family Medicine

## 2021-01-06 DIAGNOSIS — Z90710 Acquired absence of both cervix and uterus: Secondary | ICD-10-CM

## 2021-01-06 DIAGNOSIS — E7800 Pure hypercholesterolemia, unspecified: Secondary | ICD-10-CM | POA: Diagnosis present

## 2021-01-06 DIAGNOSIS — Z882 Allergy status to sulfonamides status: Secondary | ICD-10-CM

## 2021-01-06 DIAGNOSIS — E039 Hypothyroidism, unspecified: Secondary | ICD-10-CM | POA: Diagnosis present

## 2021-01-06 DIAGNOSIS — I5042 Chronic combined systolic (congestive) and diastolic (congestive) heart failure: Secondary | ICD-10-CM | POA: Diagnosis present

## 2021-01-06 DIAGNOSIS — Z86008 Personal history of in-situ neoplasm of other site: Secondary | ICD-10-CM

## 2021-01-06 DIAGNOSIS — D696 Thrombocytopenia, unspecified: Secondary | ICD-10-CM | POA: Diagnosis present

## 2021-01-06 DIAGNOSIS — J9601 Acute respiratory failure with hypoxia: Secondary | ICD-10-CM | POA: Diagnosis present

## 2021-01-06 DIAGNOSIS — K219 Gastro-esophageal reflux disease without esophagitis: Secondary | ICD-10-CM | POA: Diagnosis present

## 2021-01-06 DIAGNOSIS — Z801 Family history of malignant neoplasm of trachea, bronchus and lung: Secondary | ICD-10-CM

## 2021-01-06 DIAGNOSIS — I5030 Unspecified diastolic (congestive) heart failure: Secondary | ICD-10-CM | POA: Diagnosis present

## 2021-01-06 DIAGNOSIS — I130 Hypertensive heart and chronic kidney disease with heart failure and stage 1 through stage 4 chronic kidney disease, or unspecified chronic kidney disease: Secondary | ICD-10-CM | POA: Diagnosis present

## 2021-01-06 DIAGNOSIS — R6510 Systemic inflammatory response syndrome (SIRS) of non-infectious origin without acute organ dysfunction: Secondary | ICD-10-CM

## 2021-01-06 DIAGNOSIS — I4891 Unspecified atrial fibrillation: Secondary | ICD-10-CM | POA: Diagnosis present

## 2021-01-06 DIAGNOSIS — F03A0 Unspecified dementia, mild, without behavioral disturbance, psychotic disturbance, mood disturbance, and anxiety: Secondary | ICD-10-CM | POA: Diagnosis present

## 2021-01-06 DIAGNOSIS — Z833 Family history of diabetes mellitus: Secondary | ICD-10-CM

## 2021-01-06 DIAGNOSIS — R0603 Acute respiratory distress: Secondary | ICD-10-CM

## 2021-01-06 DIAGNOSIS — Z800 Family history of malignant neoplasm of digestive organs: Secondary | ICD-10-CM

## 2021-01-06 DIAGNOSIS — A419 Sepsis, unspecified organism: Secondary | ICD-10-CM | POA: Diagnosis not present

## 2021-01-06 DIAGNOSIS — Z6826 Body mass index (BMI) 26.0-26.9, adult: Secondary | ICD-10-CM

## 2021-01-06 DIAGNOSIS — K449 Diaphragmatic hernia without obstruction or gangrene: Secondary | ICD-10-CM

## 2021-01-06 DIAGNOSIS — E871 Hypo-osmolality and hyponatremia: Secondary | ICD-10-CM | POA: Diagnosis present

## 2021-01-06 DIAGNOSIS — J9621 Acute and chronic respiratory failure with hypoxia: Secondary | ICD-10-CM | POA: Diagnosis present

## 2021-01-06 DIAGNOSIS — Z8249 Family history of ischemic heart disease and other diseases of the circulatory system: Secondary | ICD-10-CM

## 2021-01-06 DIAGNOSIS — E440 Moderate protein-calorie malnutrition: Secondary | ICD-10-CM | POA: Diagnosis present

## 2021-01-06 DIAGNOSIS — Z7901 Long term (current) use of anticoagulants: Secondary | ICD-10-CM

## 2021-01-06 DIAGNOSIS — Z79899 Other long term (current) drug therapy: Secondary | ICD-10-CM

## 2021-01-06 DIAGNOSIS — J189 Pneumonia, unspecified organism: Secondary | ICD-10-CM | POA: Diagnosis present

## 2021-01-06 DIAGNOSIS — Z20822 Contact with and (suspected) exposure to covid-19: Secondary | ICD-10-CM | POA: Diagnosis present

## 2021-01-06 DIAGNOSIS — Z880 Allergy status to penicillin: Secondary | ICD-10-CM

## 2021-01-06 DIAGNOSIS — S0003XA Contusion of scalp, initial encounter: Secondary | ICD-10-CM | POA: Diagnosis present

## 2021-01-06 DIAGNOSIS — E876 Hypokalemia: Secondary | ICD-10-CM | POA: Diagnosis present

## 2021-01-06 DIAGNOSIS — I4892 Unspecified atrial flutter: Secondary | ICD-10-CM | POA: Diagnosis present

## 2021-01-06 DIAGNOSIS — E222 Syndrome of inappropriate secretion of antidiuretic hormone: Secondary | ICD-10-CM | POA: Diagnosis present

## 2021-01-06 DIAGNOSIS — W010XXA Fall on same level from slipping, tripping and stumbling without subsequent striking against object, initial encounter: Secondary | ICD-10-CM | POA: Diagnosis present

## 2021-01-06 DIAGNOSIS — I5033 Acute on chronic diastolic (congestive) heart failure: Secondary | ICD-10-CM | POA: Diagnosis not present

## 2021-01-06 DIAGNOSIS — I4821 Permanent atrial fibrillation: Secondary | ICD-10-CM | POA: Diagnosis present

## 2021-01-06 DIAGNOSIS — N1831 Chronic kidney disease, stage 3a: Secondary | ICD-10-CM | POA: Diagnosis present

## 2021-01-06 DIAGNOSIS — Y92009 Unspecified place in unspecified non-institutional (private) residence as the place of occurrence of the external cause: Secondary | ICD-10-CM | POA: Diagnosis not present

## 2021-01-06 DIAGNOSIS — Z823 Family history of stroke: Secondary | ICD-10-CM

## 2021-01-06 DIAGNOSIS — I2510 Atherosclerotic heart disease of native coronary artery without angina pectoris: Secondary | ICD-10-CM | POA: Diagnosis present

## 2021-01-06 DIAGNOSIS — Z79890 Hormone replacement therapy (postmenopausal): Secondary | ICD-10-CM

## 2021-01-06 DIAGNOSIS — R6520 Severe sepsis without septic shock: Secondary | ICD-10-CM | POA: Diagnosis present

## 2021-01-06 DIAGNOSIS — R0902 Hypoxemia: Secondary | ICD-10-CM

## 2021-01-06 DIAGNOSIS — M810 Age-related osteoporosis without current pathological fracture: Secondary | ICD-10-CM | POA: Diagnosis present

## 2021-01-06 DIAGNOSIS — D539 Nutritional anemia, unspecified: Secondary | ICD-10-CM | POA: Diagnosis present

## 2021-01-06 DIAGNOSIS — I509 Heart failure, unspecified: Secondary | ICD-10-CM

## 2021-01-06 DIAGNOSIS — B9789 Other viral agents as the cause of diseases classified elsewhere: Secondary | ICD-10-CM | POA: Diagnosis present

## 2021-01-06 LAB — TROPONIN I (HIGH SENSITIVITY)
Troponin I (High Sensitivity): 23 ng/L — ABNORMAL HIGH (ref <18)
Troponin I (High Sensitivity): 27 ng/L — ABNORMAL HIGH (ref <18)

## 2021-01-06 LAB — RESPIRATORY PANEL BY PCR

## 2021-01-06 LAB — CBC WITH DIFFERENTIAL/PLATELET
Abs Immature Granulocytes: 0.14 10*3/uL — ABNORMAL HIGH (ref 0.00–0.07)
Basophils Absolute: 0 10*3/uL (ref 0.0–0.1)
Basophils Relative: 0 %
Eosinophils Absolute: 0 10*3/uL (ref 0.0–0.5)
Eosinophils Relative: 0 %
HCT: 29.3 % — ABNORMAL LOW (ref 36.0–46.0)
Hemoglobin: 9.6 g/dL — ABNORMAL LOW (ref 12.0–15.0)
Immature Granulocytes: 1 %
Lymphocytes Relative: 3 %
Lymphs Abs: 0.6 10*3/uL — ABNORMAL LOW (ref 0.7–4.0)
MCH: 30.2 pg (ref 26.0–34.0)
MCHC: 32.8 g/dL (ref 30.0–36.0)
MCV: 92.1 fL (ref 80.0–100.0)
Monocytes Absolute: 1.4 10*3/uL — ABNORMAL HIGH (ref 0.1–1.0)
Monocytes Relative: 8 %
Neutro Abs: 16.5 10*3/uL — ABNORMAL HIGH (ref 1.7–7.7)
Neutrophils Relative %: 88 %
Platelets: 148 10*3/uL — ABNORMAL LOW (ref 150–400)
RBC: 3.18 MIL/uL — ABNORMAL LOW (ref 3.87–5.11)
RDW: 15.4 % (ref 11.5–15.5)
WBC: 18.8 10*3/uL — ABNORMAL HIGH (ref 4.0–10.5)
nRBC: 0 % (ref 0.0–0.2)

## 2021-01-06 LAB — HIV ANTIBODY (ROUTINE TESTING W REFLEX): HIV Screen 4th Generation wRfx: NONREACTIVE

## 2021-01-06 LAB — TYPE AND SCREEN
ABO/RH(D): A POS
Antibody Screen: NEGATIVE

## 2021-01-06 LAB — BASIC METABOLIC PANEL
Anion gap: 12 (ref 5–15)
BUN: 17 mg/dL (ref 8–23)
CO2: 21 mmol/L — ABNORMAL LOW (ref 22–32)
Calcium: 8.5 mg/dL — ABNORMAL LOW (ref 8.9–10.3)
Chloride: 97 mmol/L — ABNORMAL LOW (ref 98–111)
Creatinine, Ser: 1.14 mg/dL — ABNORMAL HIGH (ref 0.44–1.00)
GFR, Estimated: 45 mL/min — ABNORMAL LOW (ref >60)
Glucose, Bld: 88 mg/dL (ref 70–99)
Potassium: 4.6 mmol/L (ref 3.5–5.1)
Sodium: 130 mmol/L — ABNORMAL LOW (ref 135–145)

## 2021-01-06 LAB — STREP PNEUMONIAE URINARY ANTIGEN: Strep Pneumo Urinary Antigen: NEGATIVE

## 2021-01-06 LAB — TSH: TSH: 2.214 u[IU]/mL (ref 0.350–4.500)

## 2021-01-06 LAB — URINALYSIS, ROUTINE W REFLEX MICROSCOPIC
Bacteria, UA: NONE SEEN
Bilirubin Urine: NEGATIVE
Glucose, UA: NEGATIVE mg/dL
Ketones, ur: NEGATIVE mg/dL
Nitrite: NEGATIVE
Protein, ur: NEGATIVE mg/dL
Specific Gravity, Urine: 1.012 (ref 1.005–1.030)
pH: 5 (ref 5.0–8.0)

## 2021-01-06 LAB — POC OCCULT BLOOD, ED: Fecal Occult Bld: NEGATIVE

## 2021-01-06 LAB — RESP PANEL BY RT-PCR (FLU A&B, COVID) ARPGX2
Influenza A by PCR: NEGATIVE
Influenza B by PCR: NEGATIVE
SARS Coronavirus 2 by RT PCR: NEGATIVE

## 2021-01-06 LAB — PROCALCITONIN: Procalcitonin: 0.27 ng/mL

## 2021-01-06 LAB — HEMOGLOBIN AND HEMATOCRIT, BLOOD
HCT: 27.6 % — ABNORMAL LOW (ref 36.0–46.0)
Hemoglobin: 9 g/dL — ABNORMAL LOW (ref 12.0–15.0)

## 2021-01-06 LAB — LACTIC ACID, PLASMA: Lactic Acid, Venous: 1.9 mmol/L (ref 0.5–1.9)

## 2021-01-06 LAB — BRAIN NATRIURETIC PEPTIDE: B Natriuretic Peptide: 591.6 pg/mL — ABNORMAL HIGH (ref 0.0–100.0)

## 2021-01-06 MED ORDER — SODIUM CHLORIDE 0.9 % IV SOLN
500.0000 mg | Freq: Once | INTRAVENOUS | Status: DC
  Administered 2021-01-06: 500 mg via INTRAVENOUS
  Filled 2021-01-06: qty 500

## 2021-01-06 MED ORDER — ISOSORBIDE MONONITRATE ER 60 MG PO TB24
60.0000 mg | ORAL_TABLET | Freq: Every day | ORAL | Status: DC
  Administered 2021-01-07 – 2021-01-12 (×6): 60 mg via ORAL
  Filled 2021-01-06 (×7): qty 1

## 2021-01-06 MED ORDER — PREDNISONE 20 MG PO TABS
40.0000 mg | ORAL_TABLET | Freq: Every day | ORAL | Status: DC
  Administered 2021-01-07 – 2021-01-08 (×2): 40 mg via ORAL
  Filled 2021-01-06 (×2): qty 2

## 2021-01-06 MED ORDER — METHYLPREDNISOLONE SODIUM SUCC 40 MG IJ SOLR
40.0000 mg | Freq: Once | INTRAMUSCULAR | Status: AC
Start: 2021-01-06 — End: 2021-01-06
  Administered 2021-01-06: 40 mg via INTRAVENOUS
  Filled 2021-01-06: qty 1

## 2021-01-06 MED ORDER — SODIUM CHLORIDE 0.9% FLUSH
3.0000 mL | Freq: Two times a day (BID) | INTRAVENOUS | Status: DC
  Administered 2021-01-06 – 2021-01-12 (×11): 3 mL via INTRAVENOUS

## 2021-01-06 MED ORDER — BACITRACIN ZINC 500 UNIT/GM EX OINT
1.0000 "application " | TOPICAL_OINTMENT | Freq: Two times a day (BID) | CUTANEOUS | Status: DC
  Administered 2021-01-07 – 2021-01-12 (×10): 1 via TOPICAL
  Filled 2021-01-06: qty 28.4

## 2021-01-06 MED ORDER — POLYETHYLENE GLYCOL 3350 17 G PO PACK: 17.0000 g | PACK | Freq: Every day | ORAL | Status: DC | PRN

## 2021-01-06 MED ORDER — ALBUTEROL SULFATE (2.5 MG/3ML) 0.083% IN NEBU
5.0000 mg | INHALATION_SOLUTION | Freq: Once | RESPIRATORY_TRACT | Status: AC
  Administered 2021-01-06: 5 mg via RESPIRATORY_TRACT
  Filled 2021-01-06: qty 6

## 2021-01-06 MED ORDER — SODIUM CHLORIDE 0.9 % IV SOLN
2.0000 g | INTRAVENOUS | Status: AC
  Administered 2021-01-07 – 2021-01-10 (×4): 2 g via INTRAVENOUS
  Filled 2021-01-06 (×4): qty 20

## 2021-01-06 MED ORDER — FUROSEMIDE 10 MG/ML IJ SOLN
20.0000 mg | Freq: Every day | INTRAMUSCULAR | Status: DC
Start: 2021-01-06 — End: 2021-01-09
  Administered 2021-01-06 – 2021-01-08 (×3): 20 mg via INTRAVENOUS
  Filled 2021-01-06 (×3): qty 2

## 2021-01-06 MED ORDER — SODIUM CHLORIDE 0.9 % IV SOLN
100.0000 mg | Freq: Two times a day (BID) | INTRAVENOUS | Status: DC
  Administered 2021-01-07 – 2021-01-12 (×11): 100 mg via INTRAVENOUS
  Filled 2021-01-06 (×12): qty 100

## 2021-01-06 MED ORDER — SACCHAROMYCES BOULARDII 250 MG PO CAPS
250.0000 mg | ORAL_CAPSULE | Freq: Two times a day (BID) | ORAL | Status: DC
  Administered 2021-01-06 – 2021-01-12 (×13): 250 mg via ORAL
  Filled 2021-01-06 (×13): qty 1

## 2021-01-06 MED ORDER — ACETAMINOPHEN 325 MG PO TABS: 650.0000 mg | ORAL_TABLET | Freq: Four times a day (QID) | ORAL | Status: DC | PRN

## 2021-01-06 MED ORDER — ACETAMINOPHEN 650 MG RE SUPP: 650.0000 mg | Freq: Four times a day (QID) | RECTAL | Status: DC | PRN

## 2021-01-06 MED ORDER — LEVOTHYROXINE SODIUM 25 MCG PO TABS
62.5000 ug | ORAL_TABLET | Freq: Every day | ORAL | Status: DC
Start: 2021-01-07 — End: 2021-01-13
  Administered 2021-01-07 – 2021-01-12 (×6): 62.5 ug via ORAL
  Filled 2021-01-06 (×7): qty 1

## 2021-01-06 MED ORDER — SODIUM CHLORIDE 0.9 % IV SOLN
1.0000 g | Freq: Once | INTRAVENOUS | Status: AC
  Administered 2021-01-06: 1 g via INTRAVENOUS
  Filled 2021-01-06: qty 10

## 2021-01-06 MED ORDER — ALBUTEROL SULFATE (2.5 MG/3ML) 0.083% IN NEBU: 2.5000 mg | INHALATION_SOLUTION | Freq: Four times a day (QID) | RESPIRATORY_TRACT | Status: DC | PRN

## 2021-01-06 MED ORDER — GUAIFENESIN ER 600 MG PO TB12
600.0000 mg | ORAL_TABLET | Freq: Two times a day (BID) | ORAL | Status: DC
  Administered 2021-01-06 – 2021-01-12 (×13): 600 mg via ORAL
  Filled 2021-01-06 (×13): qty 1

## 2021-01-06 MED ORDER — ATORVASTATIN CALCIUM 10 MG PO TABS
20.0000 mg | ORAL_TABLET | Freq: Every day | ORAL | Status: DC
  Administered 2021-01-07 – 2021-01-12 (×6): 20 mg via ORAL
  Filled 2021-01-06 (×7): qty 2

## 2021-01-06 MED ORDER — ACETAMINOPHEN 325 MG PO TABS
650.0000 mg | ORAL_TABLET | Freq: Three times a day (TID) | ORAL | Status: DC
  Administered 2021-01-06 – 2021-01-12 (×19): 650 mg via ORAL
  Filled 2021-01-06 (×21): qty 2

## 2021-01-06 MED ORDER — AMIODARONE HCL 200 MG PO TABS
200.0000 mg | ORAL_TABLET | Freq: Every day | ORAL | Status: DC
  Administered 2021-01-07 – 2021-01-12 (×6): 200 mg via ORAL
  Filled 2021-01-06 (×6): qty 1

## 2021-01-06 MED ORDER — APIXABAN 2.5 MG PO TABS
2.5000 mg | ORAL_TABLET | Freq: Two times a day (BID) | ORAL | Status: DC
  Administered 2021-01-06 – 2021-01-12 (×13): 2.5 mg via ORAL
  Filled 2021-01-06 (×13): qty 1

## 2021-01-06 MED ORDER — DARIFENACIN HYDROBROMIDE ER 15 MG PO TB24
15.0000 mg | ORAL_TABLET | Freq: Every day | ORAL | Status: DC
  Administered 2021-01-07 – 2021-01-12 (×6): 15 mg via ORAL
  Filled 2021-01-06 (×6): qty 1

## 2021-01-06 MED ORDER — LACTATED RINGERS IV BOLUS
500.0000 mL | Freq: Once | INTRAVENOUS | Status: AC
  Administered 2021-01-06: 500 mL via INTRAVENOUS

## 2021-01-06 NOTE — ED Notes (Signed)
Assumed care of pt at 1330

## 2021-01-06 NOTE — ED Triage Notes (Signed)
Pt BIB ems from home; endorses chills, fever, cough x 2 days, sob this am; rhonchi throughout noted by ems; 5 albuterol, 0.5 Atrovent given PTA, no significant improvement; 90% AR; placed on 3L, 96%; denies pain; endorses contact with flu positive family members  137/96 HR 70 RR 18

## 2021-01-06 NOTE — H&P (Signed)
History and Physical    LAPORSCHE HOEGER WRU:[id number] DOB: [birth year] DOA: 01/06/2021  Referring MD/NP/PA: Blanchie Dessert, MD PCP: Biagio Borg, MD  Patient coming from: Home(lives alone) via EMS  Chief Complaint: Cough and shortness of breath  I have personally briefly reviewed patient's old medical records in [place]   HPI: Cathy [name] is a 87 y.o. female with medical history significant of HTN, HLD, HFpEF, paroxysmal atrial flutter, hiatal hernia, and osteoarthritis who presents with complaints of cough and shortness of breath.  History is obtained from the patient and additional information is obtained over the phone from her daughter.  Patient reports that she started not feeling well approximately 2 days ago.  She complained of having diffuse body aches to the point where she hardly to get out of bed.  Noted also having sore throat, fever, chills, congestion, intermittently productive cough, mild substernal chest discomfort, and dysuria.  She had fallen and hit her head.  This morning however she got to the point in which she was having difficulty breathing.  She had recently had contact with family members who had the flu recently.  She had received her vaccination against the flu and COVID.  The patient chronically has some swelling in her legs that she feels is unchanged.  This morning however she got to the point where she could not catch her breath and EMS was called.  She has not had any significant nausea, vomiting, or diarrhea.  When discussing CODE STATUS patient states that she would like to remain a full code.  Her daughter who is her power of attorney notes that the patient often changes her mind and she has some mild dementia, but is okay with respecting her current wishes to remain a full code at this time.  She does still live at home alone and gets around with use of a walker.  Family and friends come to check on her throughout the day.  ED Course: Upon admission to  the emergency department patient was seen to be afebrile, suppressions 18-23, blood pressure 93/47-126/66, and O2 saturations noted to be as low as 90% on room air placed on 2-3 L nasal cannula oxygen.  Chest x-ray noted large hiatal hernia with mild left pleural effusion with associated atelectasis versus infiltrate.  Labs significant for WBC18.8, hemoglobin 9.6, platelets 148, sodium 130, CO2 29, BUN 17, creatinine 1.14, BNP 591.6, high-sensitivity troponin 23, and lactic acid 1.9.  CT scan of the head noted no acute cranial abnormality and improvement in right parietal scalp hematoma.  Patient had been given lactated Ringer's 500 mL IV fluid bolus, albuterol neb, and Rocephin, and azithromycin.  Review of Systems  Constitutional:  Positive for chills, fever and malaise/fatigue.  HENT:  Positive for sore throat.   Eyes:  Negative for photophobia and pain.  Cardiovascular:  Positive for chest pain and leg swelling.  Gastrointestinal:  Negative for abdominal pain, diarrhea, nausea and vomiting.  Genitourinary:  Positive for dysuria.  Skin:  Negative for rash.  Neurological:  Negative for loss of consciousness.   Past Medical History:  Diagnosis Date   Allergic rhinitis    Anemia    Anxiety    Chronic systolic CHF (congestive heart failure) ([place])    a. 2D Echo 02/02/15: EF 25-30%, akinesis of mid-apical anteroseptal myocardium, grade 3 DD, mod MR, severely dilated LA, mildly reduced RV systoluc function, mod dilated RA, mod TR, mildly increased PASP 52mmHg. b. 10/2016: EF 55-60% with no regional  WMA. Grade 3 DD.    CLOSTRIDIUM DIFFICILE COLITIS 05/26/2009   Qualifier: Diagnosis of  By: Nils Pyle CMA (AAMA), Leisha     Colitis    lymphocytic colitis feb 2011   Coronary artery calcification    a. By CT 01/2015.   Diverticulosis of colon    DJD (degenerative joint disease)    gets epidural injections   Dysphagia    Hiatal hernia    Hypercholesteremia    Hypertension    Iron deficiency     Irritable bowel syndrome    Liver lesion 02/18/2015   Lumbar back pain    Osteoporosis    Paroxysmal atrial flutter ([place])    a. Dx 01/2015 - underwent DCCV 03/2015; takes Eliquis   Sinus bradycardia    Splenic lesion 02/18/2015   Squamous cell carcinoma in situ    Urinary incontinence    Venous insufficiency    Vitamin B12 deficiency     Past Surgical History:  Procedure Laterality Date   ABDOMINAL HYSTERECTOMY     CARDIOVERSION N/A 02/03/2015   Procedure: CARDIOVERSION;  Surgeon: Sueanne Margarita, MD;  Location: [facility];  Service: Cardiovascular;  Laterality: N/A;   CARDIOVERSION N/A 03/10/2015   Procedure: CARDIOVERSION;  Surgeon: Dorothy Spark, MD;  Location: [facility];  Service: Cardiovascular;  Laterality: N/A;   CARDIOVERSION N/A 01/07/2017   Procedure: CARDIOVERSION;  Surgeon: Pixie Casino, MD;  Location: [facility];  Service: Cardiovascular;  Laterality: N/A;   cataract surgery     decompressive lumbar laminectomy  06/2008   at L2-3, L3-4 and L4-5 by Dr. Ronnald Ramp   HIP PINNING,CANNULATED Left 06/30/2019   Procedure: CANNULATED LEFT HIP PINNING;  Surgeon: Mcarthur Rossetti, MD;  Location: [facility];  Service: Orthopedics;  Laterality: Left;   LAPAROSCOPIC CHOLECYSTECTOMY  04/2009   Dr. Abran Cantor   REPLACEMENT TOTAL KNEE     TEE WITHOUT CARDIOVERSION N/A 02/03/2015   Procedure: TRANSESOPHAGEAL ECHOCARDIOGRAM (TEE);  Surgeon: Sueanne Margarita, MD;  Location: [facility] ENDOSCOPY;  Service: Cardiovascular;  Laterality: N/A;     reports that she has quit smoking. Her smoking use included cigarettes. She has a 5.00 pack-year smoking history. She has never used smokeless tobacco. She reports that she does not drink alcohol and does not use drugs.  Allergies  Allergen Reactions   Amoxicillin-Pot Clavulanate Diarrhea    Has patient had a PCN reaction causing immediate rash, facial/tongue/throat swelling, SOB or lightheadedness with hypotension: no Has patient had a PCN reaction  causing severe rash involving mucus membranes or skin necrosis:  no Has patient had a PCN reaction that required hospitalization: no Has patient had a PCN reaction occurring within the last 10 years no If all of the above answers are "NO", then may proceed with Cephalosporin use.    Penicillins    Sulfonamide Derivatives Other (See Comments)    unknown    Family History  Problem Relation Age of Onset   Cancer Brother        lung   Diabetes Son    Coronary artery disease Son    Kidney disease Son    Stroke Son    Heart attack Mother    Cancer Brother        colon   Cancer Daughter        cervical, lung    Heart attack Brother    Diabetes Brother    Heart attack Sister    Hypertension Neg Hx    Fainting Neg Hx  Prior to Admission medications   Medication Sig Start Date End Date Taking? Authorizing Provider  acetaminophen (TYLENOL) 325 MG tablet Take 650 mg by mouth every 8 (eight) hours.    [provider]  amiodarone (PACERONE) 200 MG tablet TAKE 1 TABLET DAILY 10/28/20   Camnitz, Ocie Doyne, MD  apixaban (ELIQUIS) 2.5 MG TABS tablet Take 1 tablet (2.5 mg total) by mouth 2 (two) times daily. Overdue for labwork, MUST have labwork at upcoming OV with MD for FUTURE refills. 10/27/20   Camnitz, Will Hassell Done, MD  atorvastatin (LIPITOR) 20 MG tablet TAKE 1 TABLET DAILY AT 6:00IN THE EVENING 10/28/20   Camnitz, Ocie Doyne, MD  bacitracin ointment Apply 1 application topically 2 (two) times daily. 10/23/20   Lamptey, Myrene Galas, MD  calcium-vitamin D (OSCAL WITH D) 500-200 MG-UNIT per tablet Take 1 tablet by mouth daily.     [provider]  cholecalciferol (VITAMIN D) 1000 UNITS tablet Take 1,000 Units by mouth daily.    [provider]  esomeprazole (NEXIUM) 40 MG capsule Take 1 capsule (40 mg total) by mouth daily at 12 noon. 07/23/19   Granville Lewis C, PA-C  furosemide (LASIX) 20 MG tablet Take 1 tablet (20 mg total) by mouth every other day. 08/26/20   Biagio Borg, MD  isosorbide mononitrate (IMDUR) 60 MG 24 hr tablet TAKE 1 TABLET DAILY 10/28/20   Constance Haw, MD  Misc. Devices [facility]) MISC Use as directed three times daily 08/15/19   Biagio Borg, MD  polyethylene glycol (MIRALAX / GLYCOLAX) 17 g packet Take 17 g by mouth daily. 07/03/19   [provider]  senna-docusate (SENOKOT-S) 8.6-50 MG tablet Take 1 tablet by mouth 2 (two) times daily. For Constipation    [provider]  solifenacin (VESICARE) 10 MG tablet Take 1 tablet (10 mg total) by mouth daily. 07/23/19   Granville Lewis C, PA-C  SYNTHROID 125 MCG tablet TAKE 1/2 TABLET DAILY. 04/30/20   Biagio Borg, MD  vitamin B-12 (CYANOCOBALAMIN) 1000 MCG tablet Take 1 tablet (1,000 mcg total) by mouth daily. 06/17/14   Rowe Clack, MD    Physical Exam:  Constitutional: Elderly female who appears to be in some mild respiratory distress.  Right parietal scalp hematoma Vitals:   01/06/21 1045 01/06/21 1100 01/06/21 1115 01/06/21 1223  BP: (!) 93/47 (!) 96/43 (!) 101/51 (!) 108/55  Pulse: 63 66 63 65  Resp: (!) 23 (!) 21 18 (!) 23  Temp:      TempSrc:      SpO2: 93% 93% 95% 98%  Weight:      Height:       Eyes: PERRL, lids and conjunctivae normal ENMT: Mucous membranes are moist. Posterior pharynx clear of any exudate or lesions.  Neck: normal, supple, no masses, no thyromegaly Respiratory: Patient with expiratory wheezes appreciated.  No significant rales or rhonchi.  O2 saturations currently maintained on 2 L of nasal cannula oxygen Cardiovascular: Irregular irregular rhythm.  +1 pitting edema appreciated.  Abdomen: no tenderness, no masses palpated. No hepatosplenomegaly. Bowel sounds positive.  Musculoskeletal: no clubbing / cyanosis. No joint deformity upper and lower extremities. Good ROM, no contractures. Normal muscle tone.  Skin: no rashes, lesions, ulcers. No induration Neurologic: CN 2-12 grossly intact.  Able to move all  extremities. Psychiatric:  Alert and oriented x 3. Normal mood.     Labs on Admission: I have personally reviewed following labs and imaging studies  CBC: Recent Labs  Lab 01/06/21 1025  WBC 18.8*  NEUTROABS 16.5*  HGB 9.6*  HCT 29.3*  MCV 92.1  PLT 767*   Basic Metabolic Panel: Recent Labs  Lab 01/06/21 1025  NA 130*  K 4.6  CL 97*  CO2 21*  GLUCOSE 88  BUN 17  CREATININE 1.14*  CALCIUM 8.5*   GFR: Estimated Creatinine Clearance: 20.2 mL/min (A) (by C-G formula based on SCr of 1.14 mg/dL (H)). Liver Function Tests: No results for input(s): AST, ALT, ALKPHOS, BILITOT, PROT, ALBUMIN in the last 168 hours. No results for input(s): LIPASE, AMYLASE in the last 168 hours. No results for input(s): AMMONIA in the last 168 hours. Coagulation Profile: No results for input(s): INR, PROTIME in the last 168 hours. Cardiac Enzymes: No results for input(s): CKTOTAL, CKMB, CKMBINDEX, TROPONINI in the last 168 hours. BNP (last 3 results) No results for input(s): PROBNP in the last 8760 hours. HbA1C: No results for input(s): HGBA1C in the last 72 hours. CBG: No results for input(s): GLUCAP in the last 168 hours. Lipid Profile: No results for input(s): CHOL, HDL, LDLCALC, TRIG, CHOLHDL, LDLDIRECT in the last 72 hours. Thyroid Function Tests: No results for input(s): TSH, T4TOTAL, FREET4, T3FREE, THYROIDAB in the last 72 hours. Anemia Panel: No results for input(s): VITAMINB12, FOLATE, FERRITIN, TIBC, IRON, RETICCTPCT in the last 72 hours. Urine analysis:    Component Value Date/Time   COLORURINE COLORLESS (A) 12/28/2020 1420   APPEARANCEUR CLEAR 12/28/2020 1420   LABSPEC 1.005 12/28/2020 1420   PHURINE 6.0 12/28/2020 1420   GLUCOSEU NEGATIVE 12/28/2020 1420   GLUCOSEU NEGATIVE 04/11/2017 1536   HGBUR SMALL (A) 12/28/2020 1420   BILIRUBINUR NEGATIVE 12/28/2020 1420   BILIRUBINUR neg 11/18/2016 Uncertain 12/28/2020 1420   PROTEINUR NEGATIVE 12/28/2020  1420   UROBILINOGEN 0.2 04/11/2017 1536   NITRITE NEGATIVE 12/28/2020 1420   LEUKOCYTESUR NEGATIVE 12/28/2020 1420   Sepsis Labs: Recent Results (from the past 240 hour(s))  Resp Panel by RT-PCR (Flu A&B, Covid) Nasopharyngeal Swab     Status: None   Collection Time: 01/06/21  9:47 AM   Specimen: Nasopharyngeal Swab; Nasopharyngeal(NP) swabs in vial transport medium  Result Value Ref Range Status   SARS Coronavirus 2 by RT PCR NEGATIVE NEGATIVE Final    Comment: (NOTE) SARS-CoV-2 target nucleic acids are NOT DETECTED.  The SARS-CoV-2 RNA is generally detectable in upper respiratory specimens during the acute phase of infection. The lowest concentration of SARS-CoV-2 viral copies this assay can detect is 138 copies/mL. A negative result does not preclude SARS-Cov-2 infection and should not be used as the sole basis for treatment or other patient management decisions. A negative result may occur with  improper specimen collection/handling, submission of specimen other than nasopharyngeal swab, presence of viral mutation(s) within the areas targeted by this assay, and inadequate number of viral copies(<138 copies/mL). A negative result must be combined with clinical observations, patient history, and epidemiological information. The expected result is Negative.  Fact Sheet for Patients:  EntrepreneurPulse.com.au  Fact Sheet for Healthcare Providers:  IncredibleEmployment.be  This test is no t yet approved or cleared by the [place] FDA and  has been authorized for detection and/or diagnosis of SARS-CoV-2 by FDA under an Emergency Use Authorization (EUA). This EUA will remain  in effect (meaning this test can be used) for the duration of the COVID-19 declaration under Section 564(b)(1) of the Act, 21 U.S.C.section 360bbb-3(b)(1), unless the authorization is terminated  or revoked sooner.  Influenza A by PCR NEGATIVE NEGATIVE Final    Influenza B by PCR NEGATIVE NEGATIVE Final    Comment: (NOTE) The Xpert Xpress SARS-CoV-2/FLU/RSV plus assay is intended as an aid in the diagnosis of influenza from Nasopharyngeal swab specimens and should not be used as a sole basis for treatment. Nasal washings and aspirates are unacceptable for Xpert Xpress SARS-CoV-2/FLU/RSV testing.  Fact Sheet for Patients: EntrepreneurPulse.com.au  Fact Sheet for Healthcare Providers: IncredibleEmployment.be  This test is not yet approved or cleared by the [place] FDA and has been authorized for detection and/or diagnosis of SARS-CoV-2 by FDA under an Emergency Use Authorization (EUA). This EUA will remain in effect (meaning this test can be used) for the duration of the COVID-19 declaration under Section 564(b)(1) of the Act, 21 U.S.C. section 360bbb-3(b)(1), unless the authorization is terminated or revoked.  Performed at [facility], [place] [address], [place], [place] [address]      Radiological Exams on Admission: CT Head Wo Contrast  Result Date: 01/06/2021 CLINICAL DATA:  Head trauma, minor (Age >= 65y) recent head trauma, fever, cough EXAM: CT HEAD WITHOUT CONTRAST TECHNIQUE: Contiguous axial images were obtained from the base of the skull through the vertex without intravenous contrast. COMPARISON:  Head CT 12/28/2020 FINDINGS: Brain: No evidence of acute intracranial hemorrhage or extra-axial collection. The ventricles are unchanged in size.Scattered subcortical and periventricular white matter hypodensities, nonspecific but likely sequela of chronic small vessel ischemic disease.Mild cerebral atrophy Vascular: Stable vascular calcifications. Skull: Normal. Negative for fracture or focal lesion. Sinuses/Orbits: Mucosal thickening of the anterior ethmoid air cells. Small mucous retention cyst in the left maxillary sinus. The mastoid air cells are clear. Other: Slight improvement in the  right high parietal scalp hematoma. IMPRESSION: No acute intracranial abnormality. Unchanged sequela of chronic small vessel ischemic disease and cerebral atrophy. Slight improvement in the right high parietal scalp hematoma. Electronically Signed   By: Maurine Simmering M.D.   On: 01/06/2021 12:03   DG Chest Port 1 View  Result Date: 01/06/2021 CLINICAL DATA:  Shortness of breath, fever, cough. EXAM: PORTABLE CHEST 1 VIEW COMPARISON:  December 28, 2020. FINDINGS: Stable cardiomegaly. Large hiatal hernia. Right lung is clear. Mild left pleural effusion is noted with associated left basilar atelectasis or infiltrate. Bony thorax is unremarkable. IMPRESSION: Large hiatal hernia. Mild left pleural effusion is noted with associated left basilar atelectasis or infiltrate. Electronically Signed   By: Marijo Conception M.D.   On: 01/06/2021 10:18    EKG: Independently reviewed.  Atrial fibrillation at 72 bpm with QTC 525  Assessment/Plan Acute respiratory distress secondary to suspected community-acquired pneumonia: Patient presents with complaints of cough, shortness of breath, fever, chills, and body aches.  Reported to have close contact with family members with influenza.  Influenza and COVID-19 screening were negative.  She was working to breathe initially, but was never noted to be hypoxic and was placed on nasal cannula oxygen.  Chest x-ray noted mild left pleural effusion with associated left basilar atelectasis or infiltrate.  Patient had initially been ordered Rocephin and azithromycin. -Admit to a telemetry bed -Continue to monitor O2 saturations and continue nasal cannula oxygen as needed -Check sputum culture -Check respiratory virus panel -Check urine Legionella and strep -Check procalcitonin -Continue Rocephin  -Change azithromycin to doxycycline -Mucinex  Heart failure with preserved EF: Suspect acute on chronic.  Patient was noted to have 1+ pitting edema on the lower extremities.  BNP was  elevated at 591.6.  Last  echocardiogram was noted to be 55-60% with grade 3 diastolic dysfunction in 9024.  Home medication regimen includes Lasix 20 mg every other day. -Strict intake and output -Lasix 20 mg IV daily -Reassess in in a.m. and adjust diuretics as needed -Consider need of repeat echocardiogram and/or formal consult to cardiology   LeukocytosisSIRS: Acute.  WBC elevated at 18.8 and was noted to be tachypneic, but lactic acid was relatively reassuring at 1.9.  Thought possibly secondary to suspected pneumonia, but patient was also noted to have some complaints of dysuria -Check urinalysis -Recheck CBC in a.m  Atrial fibrillation and flutter on chronic anticoagulation: Patient currently in atrial fibrillation and appears to be rate controlled. -Continue amiodarone and Eliquis -Given recent fall may need to reassess risks and benefits of continuing patient on Eliquis  CAD elevated troponin: Patient did report having some mild substernal chest discomfort.  High-sensitivity troponin 23 with repeat 27.  Thought to be relatively flat which suspect possibly related to demand. -Continue to monitor  Normocytic anemia: Acute on chronic.  Hemoglobin was noted to be 9.6 g/dL, but had previously been 11.9 on 10/30.  Patient denied any complaints of bleeding.   -Type and screen for possible need for blood product -Orders placed to check stool guaiac(noted to be negative) -Continue to monitor H&H  Hyponatremia: Sodium noted to be 130 on admission.  -Recheck sodium levels tomorrow morning  Fall at home: Patient admits that she had recently fallen at home 2 days ago.  Hematoma to the right scalp was appreciated, but no intracranial abnormalities noted on CT scan. -PT to evaluate in a.m.  Thrombocytopenia: Acute on chronic.  Platelet count 148.  Review of records note platelet count has been lower in the past. -Continue to monitor  Hypothyroidism -Check TSH -Continue  levothyroxine  GERD hiatal hernia: Patient noted to have a large hiatal hernia on chest x-ray. -Held pharmacy substitution of Protonix due to prolonged QT  DVT prophylaxis: Eliquis  Code Status: Full Family Communication: Discussed plan of care with the patient daughter over the phone Disposition Plan: Hopefully discharge home once medically Consults called: None Admission status: Inpatient, require more than 2 midnight stay due to need of IV antibiotics  Norval Morton MD [facility]   If 7PM-7AM, please contact night-coverage   01/06/2021, 12:26 PM

## 2021-01-06 NOTE — ED Notes (Signed)
Report to Nicole, RN

## 2021-01-06 NOTE — ED Provider Notes (Signed)
[place] EMERGENCY DEPARTMENT Provider Note   CSN: [id number] Arrival date & time: 01/06/21  6734     History Chief Complaint  Patient presents with   Shortness of Breath    Cathy [name] is a 87 y.o. female.  Patient is a 87 year old female with a history of CHF, atrial flutter on Eliquis, hypertension, hyperlipidemia who is presenting today with complaints of shortness of breath.  Patient reports that on Sunday 3 days prior to arrival she woke up that morning and felt awful.  She complained of myalgias, fever, chills and a cough.  She did note some intermittent shortness of breath with ambulating but rested most of the day.  When she woke up Monday she had similar symptoms.  They have been progressive since Monday and this morning she woke up still having diffuse body aches, chills, cough but today she was significantly short of breath.  She was unable to even get her shoes on without feeling very winded and reported even with rest she could not catch her breath.  She has had intermittent sputum production.  She also reports on Sunday she slipped and fell in the bathroom.  She hit the right side of her head on something when she fell as well as her right knee.  She has been able to get up and walk and denies having any loss of consciousness but has had significant bruising on the right side of her head and her knee.  Her daughter and grandson have had influenza recently and she has had contact with them.  She did receive the flu and COVID shot.  She denies any nausea vomiting, chest pain, abdominal pain or diarrhea.  She does not think her legs are more swollen than baseline and denies using inhalers in the past.  The history is provided by the patient and medical records.  Shortness of Breath     Past Medical History:  Diagnosis Date   Allergic rhinitis    Anemia    Anxiety    Chronic systolic CHF (congestive heart failure) ([place])    a. 2D Echo 02/02/15: EF 25-30%,  akinesis of mid-apical anteroseptal myocardium, grade 3 DD, mod MR, severely dilated LA, mildly reduced RV systoluc function, mod dilated RA, mod TR, mildly increased PASP 8mmHg. b. 10/2016: EF 55-60% with no regional WMA. Grade 3 DD.    CLOSTRIDIUM DIFFICILE COLITIS 05/26/2009   Qualifier: Diagnosis of  By: Nils Pyle CMA (AAMA), Leisha     Colitis    lymphocytic colitis feb 2011   Coronary artery calcification    a. By CT 01/2015.   Diverticulosis of colon    DJD (degenerative joint disease)    gets epidural injections   Dysphagia    Hiatal hernia    Hypercholesteremia    Hypertension    Iron deficiency    Irritable bowel syndrome    Liver lesion 02/18/2015   Lumbar back pain    Osteoporosis    Paroxysmal atrial flutter ([place])    a. Dx 01/2015 - underwent DCCV 03/2015; takes Eliquis   Sinus bradycardia    Splenic lesion 02/18/2015   Squamous cell carcinoma in situ    Urinary incontinence    Venous insufficiency    Vitamin B12 deficiency     Patient Active Problem List   Diagnosis Date Noted   Primary osteoarthritis of both hands 07/06/2019   Hip fracture ([place]) 06/29/2019   Closed fracture of neck of left femur ([place]) 06/28/2019   GERD  without esophagitis 06/28/2019   Noninfected skin tear of leg, right, initial encounter 04/19/2018   Bowel obstruction ([place]) 02/21/2018   Hypothyroidism 02/21/2018   Osteoarthritis of left shoulder 08/10/2017   Pain in joint of left shoulder 08/10/2017   Mass of joint of left wrist 06/22/2017   Arthritis of left wrist 06/21/2017   Carpal tunnel syndrome 06/21/2017   Tenosynovitis of left wrist 06/21/2017   Fracture of distal end of humerus 03/25/2017   Closed supracondylar fracture of left humerus 01/15/2017   Gait disorder 01/15/2017   Recurrent falls 01/15/2017   Rib fractures 11/28/2016   Skin lesion 09/30/2016   Ganglion cyst of dorsum of left wrist 09/30/2016   Rotator cuff arthropathy, right 04/26/2016   Hyperglycemia 04/01/2016    Right arm pain 04/01/2016   Wrist swelling 09/30/2015   Preventative health care 04/02/2015   Chronic combined systolic and diastolic CHF (congestive heart failure) ([place]) 03/17/2015   Coronary artery calcification seen on CT scan 03/17/2015   Sinus bradycardia 03/17/2015   Splenic lesion 02/18/2015   Liver lesion 02/18/2015   Coronary artery disease involving native coronary artery of native heart without angina pectoris 02/03/2015   Atrial fibrillation and flutter ([place]) 02/02/2015   Fatigue 01/31/2015   Abnormal CT of the abdomen 01/31/2015   Prolonged Q-T interval on ECG 01/31/2015   Hepatic lesion    Chronic constipation 12/03/2014   Lower extremity edema 04/25/2014   Peripheral neuropathy ([place]) 12/10/2013   Overactive bladder 12/27/2011   Senile osteoporosis 12/27/2011   Vitamin B12 deficiency 05/03/2010   Iron deficiency anemia 05/03/2010   Normocytic anemia 05/03/2010   VENOUS INSUFFICIENCY 01/27/2009   BACK PAIN, LUMBAR 01/27/2008   URINARY INCONTINENCE 08/23/2007   DIVERTICULOSIS OF COLON 04/10/2007   HYPERCHOLESTEROLEMIA 02/13/2007   ANXIETY 02/13/2007   Essential hypertension 02/13/2007   HIATAL HERNIA 02/13/2007   Irritable bowel syndrome 02/13/2007   DEGENERATIVE JOINT DISEASE 02/13/2007   Allergic rhinitis 02/13/2007    Past Surgical History:  Procedure Laterality Date   ABDOMINAL HYSTERECTOMY     CARDIOVERSION N/A 02/03/2015   Procedure: CARDIOVERSION;  Surgeon: Sueanne Margarita, MD;  Location: [facility]COPY;  Service: Cardiovascular;  Laterality: N/A;   CARDIOVERSION N/A 03/10/2015   Procedure: CARDIOVERSION;  Surgeon: Dorothy Spark, MD;  Location: [facility];  Service: Cardiovascular;  Laterality: N/A;   CARDIOVERSION N/A 01/07/2017   Procedure: CARDIOVERSION;  Surgeon: Pixie Casino, MD;  Location: [facility] ENDOSCOPY;  Service: Cardiovascular;  Laterality: N/A;   cataract surgery     decompressive lumbar laminectomy  06/2008   at L2-3, L3-4 and L4-5 by Dr.  Ronnald Ramp   HIP PINNING,CANNULATED Left 06/30/2019   Procedure: CANNULATED LEFT HIP PINNING;  Surgeon: Mcarthur Rossetti, MD;  Location: [facility] ORS;  Service: Orthopedics;  Laterality: Left;   LAPAROSCOPIC CHOLECYSTECTOMY  04/2009   Dr. Abran Cantor   REPLACEMENT TOTAL KNEE     TEE WITHOUT CARDIOVERSION N/A 02/03/2015   Procedure: TRANSESOPHAGEAL ECHOCARDIOGRAM (TEE);  Surgeon: Sueanne Margarita, MD;  Location[facility] ENDOSCOPY;  Service: Cardiovascular;  Laterality: N/A;     OB History   No obstetric history on file.     Family History  Problem Relation Age of Onset   Cancer Brother        lung   Diabetes Son    Coronary artery disease Son    Kidney disease Son    Stroke Son    Heart attack Mother    Cancer Brother  colon   Cancer Daughter        cervical, lung    Heart attack Brother    Diabetes Brother    Heart attack Sister    Hypertension Neg Hx    Fainting Neg Hx     Social History   Tobacco Use   Smoking status: Former    Packs/day: 0.50    Years: 10.00    Pack years: 5.00    Types: Cigarettes   Smokeless tobacco: Never  Vaping Use   Vaping Use: Never used  Substance Use Topics   Alcohol use: No   Drug use: Never    Home Medications Prior to Admission medications   Medication Sig Start Date End Date Taking? Authorizing Provider  acetaminophen (TYLENOL) 325 MG tablet Take 650 mg by mouth every 8 (eight) hours.    [provider]  amiodarone (PACERONE) 200 MG tablet TAKE 1 TABLET DAILY 10/28/20   Camnitz, Ocie Doyne, MD  apixaban (ELIQUIS) 2.5 MG TABS tablet Take 1 tablet (2.5 mg total) by mouth 2 (two) times daily. Overdue for labwork, MUST have labwork at upcoming OV with MD for FUTURE refills. 10/27/20   Camnitz, Will Hassell Done, MD  atorvastatin (LIPITOR) 20 MG tablet TAKE 1 TABLET DAILY AT 6:00IN THE EVENING 10/28/20   Camnitz, Ocie Doyne, MD  bacitracin ointment Apply 1 application topically 2 (two) times daily. 10/23/20   Lamptey, Myrene Galas, MD   calcium-vitamin D (OSCAL WITH D) 500-200 MG-UNIT per tablet Take 1 tablet by mouth daily.     [provider]  cholecalciferol (VITAMIN D) 1000 UNITS tablet Take 1,000 Units by mouth daily.    [provider]  esomeprazole (NEXIUM) 40 MG capsule Take 1 capsule (40 mg total) by mouth daily at 12 noon. 07/23/19   Granville Lewis C, PA-C  furosemide (LASIX) 20 MG tablet Take 1 tablet (20 mg total) by mouth every other day. 08/26/20   Biagio Borg, MD  isosorbide mononitrate (IMDUR) 60 MG 24 hr tablet TAKE 1 TABLET DAILY 10/28/20   Constance Haw, MD  Misc. Devices [facility]) MISC Use as directed three times daily 08/15/19   Biagio Borg, MD  polyethylene glycol (MIRALAX / GLYCOLAX) 17 g packet Take 17 g by mouth daily. 07/03/19   [provider]  senna-docusate (SENOKOT-S) 8.6-50 MG tablet Take 1 tablet by mouth 2 (two) times daily. For Constipation    [provider]  solifenacin (VESICARE) 10 MG tablet Take 1 tablet (10 mg total) by mouth daily. 07/23/19   Granville Lewis C, PA-C  SYNTHROID 125 MCG tablet TAKE 1/2 TABLET DAILY. 04/30/20   Biagio Borg, MD  vitamin B-12 (CYANOCOBALAMIN) 1000 MCG tablet Take 1 tablet (1,000 mcg total) by mouth daily. 06/17/14   Rowe Clack, MD    Allergies    Amoxicillin-pot clavulanate, Penicillins, and Sulfonamide derivatives  Review of Systems   Review of Systems  Respiratory:  Positive for shortness of breath.   All other systems reviewed and are negative.  Physical Exam Updated Vital Signs BP (!) 101/51   Pulse 63   Temp 98.9 F (37.2 C) (Oral)   Resp 18   Ht 4\' 7"  (1.397 m)   Wt 48.5 kg   SpO2 95%   BMI 24.87 kg/m   Physical Exam Vitals and nursing note reviewed.  Constitutional:      General: She is not in acute distress.    Appearance: She is well-developed.  HENT:  Head: Normocephalic.   Eyes:     Pupils: Pupils are equal, round, and reactive to light.  Cardiovascular:     Rate and  Rhythm: Normal rate. Rhythm irregularly irregular.     Heart sounds: Normal heart sounds. No murmur heard.   No friction rub.  Pulmonary:     Effort: Pulmonary effort is normal. Tachypnea present.     Breath sounds: Wheezing present. No rales.  Abdominal:     General: Bowel sounds are normal. There is no distension.     Palpations: Abdomen is soft.     Tenderness: There is no abdominal tenderness. There is no guarding or rebound.  Musculoskeletal:        General: No tenderness. Normal range of motion.     Cervical back: Normal range of motion and neck supple.     Right lower leg: Edema present.     Left lower leg: Edema present.     Comments: 1+ pitting edema bilaterally.  Ecchyomsis over the right knee but full flex/ext.  Skin:    General: Skin is warm and dry.     Findings: No rash.  Neurological:     Mental Status: She is alert and oriented to person, place, and time. Mental status is at baseline.     Cranial Nerves: No cranial nerve deficit.  Psychiatric:        Mood and Affect: Mood normal.        Behavior: Behavior normal.    ED Results / Procedures / Treatments   Labs (all labs ordered are listed, but only abnormal results are displayed) Labs Reviewed  CBC WITH DIFFERENTIAL/PLATELET - Abnormal; Notable for the following components:      Result Value   WBC 18.8 (*)    RBC 3.18 (*)    Hemoglobin 9.6 (*)    HCT 29.3 (*)    Platelets 148 (*)    Neutro Abs 16.5 (*)    Lymphs Abs 0.6 (*)    Monocytes Absolute 1.4 (*)    Abs Immature Granulocytes 0.14 (*)    All other components within normal limits  BASIC METABOLIC PANEL - Abnormal; Notable for the following components:   Sodium 130 (*)    Chloride 97 (*)    CO2 21 (*)    Creatinine, Ser 1.14 (*)    Calcium 8.5 (*)    GFR, Estimated 45 (*)    All other components within normal limits  BRAIN NATRIURETIC PEPTIDE - Abnormal; Notable for the following components:   B Natriuretic Peptide 591.6 (*)    All other  components within normal limits  TROPONIN I (HIGH SENSITIVITY) - Abnormal; Notable for the following components:   Troponin I (High Sensitivity) 23 (*)    All other components within normal limits  RESP PANEL BY RT-PCR (FLU A&B, COVID) [id number]  LACTIC ACID, PLASMA  TROPONIN I (HIGH SENSITIVITY)    EKG EKG Interpretation  Date/Time:  Tuesday January 06 2021 09:41:47 EST Ventricular Rate:  72 PR Interval:    QRS Duration: 150 QT Interval:  480 QTC Calculation: 525 R Axis:   -85 Text Interpretation: Atrial fibrillation Left axis deviation Right bundle branch block Possible Lateral infarct , age undetermined No significant change since last tracing Confirmed by Blanchie Dessert [id number]) on 01/06/2021 10:13:10 AM  Radiology CT Head Wo Contrast  Result Date: 01/06/2021 CLINICAL DATA:  Head trauma, minor (Age >= 65y) recent head trauma, fever, cough EXAM: CT HEAD WITHOUT CONTRAST TECHNIQUE: Contiguous axial images were obtained  from the base of the skull through the vertex without intravenous contrast. COMPARISON:  Head CT 12/28/2020 FINDINGS: Brain: No evidence of acute intracranial hemorrhage or extra-axial collection. The ventricles are unchanged in size.Scattered subcortical and periventricular white matter hypodensities, nonspecific but likely sequela of chronic small vessel ischemic disease.Mild cerebral atrophy Vascular: Stable vascular calcifications. Skull: Normal. Negative for fracture or focal lesion. Sinuses/Orbits: Mucosal thickening of the anterior ethmoid air cells. Small mucous retention cyst in the left maxillary sinus. The mastoid air cells are clear. Other: Slight improvement in the right high parietal scalp hematoma. IMPRESSION: No acute intracranial abnormality. Unchanged sequela of chronic small vessel ischemic disease and cerebral atrophy. Slight improvement in the right high parietal scalp hematoma. Electronically Signed   By: Maurine Simmering M.D.   On: 01/06/2021 12:03   DG  Chest Port 1 View  Result Date: 01/06/2021 CLINICAL DATA:  Shortness of breath, fever, cough. EXAM: PORTABLE CHEST 1 VIEW COMPARISON:  December 28, 2020. FINDINGS: Stable cardiomegaly. Large hiatal hernia. Right lung is clear. Mild left pleural effusion is noted with associated left basilar atelectasis or infiltrate. Bony thorax is unremarkable. IMPRESSION: Large hiatal hernia. Mild left pleural effusion is noted with associated left basilar atelectasis or infiltrate. Electronically Signed   By: Marijo Conception M.D.   On: 01/06/2021 10:18    Procedures Procedures   Medications Ordered in ED Medications  cefTRIAXone (ROCEPHIN) 1 g in sodium chloride 0.9 % 100 mL IVPB (has no administration in time range)  azithromycin (ZITHROMAX) 500 mg in sodium chloride 0.9 % 250 mL IVPB (has no administration in time range)  albuterol (PROVENTIL) (2.5 MG/3ML) 0.083% nebulizer solution 5 mg (5 mg Nebulization Given 01/06/21 1020)  lactated ringers bolus 500 mL (500 mLs Intravenous New Bag/Given 01/06/21 1116)    ED Course  I have reviewed the triage vital signs and the nursing notes.  Pertinent labs & imaging results that were available during my care of the patient were reviewed by me and considered in my medical decision making (see chart for details).    MDM Rules/Calculators/A&P                           Patient is an elderly female presenting today with flulike symptoms with persistent cough and shortness of breath.  Patient is wheezing on exam and concern for bronchitis associated with flu, pneumonia versus fluid overload.  Patient does have a history of CHF and does have some distal edema but does not feel that it is any worse.  She is currently on 2 L of oxygen satting 95% but does not wear oxygen regularly.  Patient also had a significant fall 2 days ago and does take Eliquis.  Significant trauma to the right side of the head with bruising and hematoma.  We will CT to evaluate for any intracranial  bleeding.  Chest x-ray, labs pending.  Patient given albuterol and will reassess for improvement in wheezing.  Also will ensure no evidence of worsening CHF.  12:09 PM CT of the head is negative for acute intracranial injury.  Chest x-ray showing a left pleural effusion and associated atelectasis or infiltrate.  Leukocytosis is of 18,000 today, COVID and flu negative, BNP of 591 today up from the 300s 5 years ago and mild troponin leak of 23, lactic acid within normal limits, BMP with mild hyponatremia of 130 stable creatinine of 1.14 and mild worsening hemoglobin today of 9.6 with baseline of 10.  Patient for a short time had soft pressures in the 90s but recurrent blood pressure is 101/51.  She was given a gentle bolus.  After albuterol, Atrovent and minimal fluids patient reports that her breathing does feel better.  97% on 2 L currently.  However with the above findings concern for developing pneumonia.  Patient covered with Rocephin and azithromycin.  Will admit for further care.  MDM   Amount and/or Complexity of Data Reviewed Clinical lab tests: ordered and reviewed Tests in the radiology section of CPT: ordered and reviewed Tests in the medicine section of CPT: ordered and reviewed Independent visualization of images, tracings, or specimens: yes    Final Clinical Impression(s) / ED Diagnoses Final diagnoses:  Hypoxia  Community acquired pneumonia of left lower lobe of lung    Rx / DC Orders ED Discharge Orders     None        Blanchie Dessert, MD 01/06/21 1212

## 2021-01-07 ENCOUNTER — Other Ambulatory Visit: Payer: Self-pay | Admitting: *Deleted

## 2021-01-07 DIAGNOSIS — A419 Sepsis, unspecified organism: Secondary | ICD-10-CM

## 2021-01-07 DIAGNOSIS — I4892 Unspecified atrial flutter: Secondary | ICD-10-CM

## 2021-01-07 DIAGNOSIS — J9621 Acute and chronic respiratory failure with hypoxia: Secondary | ICD-10-CM | POA: Diagnosis present

## 2021-01-07 DIAGNOSIS — Z79899 Other long term (current) drug therapy: Secondary | ICD-10-CM

## 2021-01-07 DIAGNOSIS — J189 Pneumonia, unspecified organism: Secondary | ICD-10-CM | POA: Diagnosis not present

## 2021-01-07 DIAGNOSIS — I5032 Chronic diastolic (congestive) heart failure: Secondary | ICD-10-CM

## 2021-01-07 DIAGNOSIS — J9601 Acute respiratory failure with hypoxia: Secondary | ICD-10-CM | POA: Diagnosis present

## 2021-01-07 LAB — BASIC METABOLIC PANEL
Anion gap: 9 (ref 5–15)
BUN: 15 mg/dL (ref 8–23)
CO2: 27 mmol/L (ref 22–32)
Calcium: 8.5 mg/dL — ABNORMAL LOW (ref 8.9–10.3)
Chloride: 96 mmol/L — ABNORMAL LOW (ref 98–111)
Creatinine, Ser: 1.05 mg/dL — ABNORMAL HIGH (ref 0.44–1.00)
GFR, Estimated: 50 mL/min — ABNORMAL LOW (ref >60)
Glucose, Bld: 123 mg/dL — ABNORMAL HIGH (ref 70–99)
Potassium: 4 mmol/L (ref 3.5–5.1)
Sodium: 132 mmol/L — ABNORMAL LOW (ref 135–145)

## 2021-01-07 LAB — CBC
HCT: 30.8 % — ABNORMAL LOW (ref 36.0–46.0)
Hemoglobin: 10.3 g/dL — ABNORMAL LOW (ref 12.0–15.0)
MCH: 30.7 pg (ref 26.0–34.0)
MCHC: 33.4 g/dL (ref 30.0–36.0)
MCV: 91.7 fL (ref 80.0–100.0)
Platelets: 144 10*3/uL — ABNORMAL LOW (ref 150–400)
RBC: 3.36 MIL/uL — ABNORMAL LOW (ref 3.87–5.11)
RDW: 15.4 % (ref 11.5–15.5)
WBC: 12.4 10*3/uL — ABNORMAL HIGH (ref 4.0–10.5)
nRBC: 0 % (ref 0.0–0.2)

## 2021-01-07 LAB — MAGNESIUM: Magnesium: 2 mg/dL (ref 1.7–2.4)

## 2021-01-07 MED ORDER — VITAMIN B-12 1000 MCG PO TABS
1000.0000 ug | ORAL_TABLET | Freq: Every day | ORAL | Status: DC
  Administered 2021-01-07 – 2021-01-12 (×6): 1000 ug via ORAL
  Filled 2021-01-07 (×6): qty 1

## 2021-01-07 MED ORDER — HALOPERIDOL LACTATE 5 MG/ML IJ SOLN
2.0000 mg | Freq: Once | INTRAMUSCULAR | Status: AC
  Administered 2021-01-07: 2 mg via INTRAVENOUS
  Filled 2021-01-07: qty 1

## 2021-01-07 MED ORDER — FUROSEMIDE 20 MG PO TABS
20.0000 mg | ORAL_TABLET | ORAL | Status: DC
  Administered 2021-01-07 – 2021-01-09 (×2): 20 mg via ORAL
  Filled 2021-01-07 (×3): qty 1

## 2021-01-07 NOTE — Progress Notes (Signed)
PROGRESS NOTE    Cathy [name]  INO:[id number] DOB: Apr 11, 1927 DOA: 01/06/2021 PCP: Biagio Borg, MD   Brief Narrative:  HPI: Cathy [name] is a 87 y.o. female with medical history significant of HTN, HLD, HFpEF, paroxysmal atrial flutter, hiatal hernia, and osteoarthritis who presents with complaints of cough and shortness of breath.  History is obtained from the patient and additional information is obtained over the phone from her daughter.  Patient reports that she started not feeling well approximately 2 days ago.  She complained of having diffuse body aches to the point where she hardly to get out of bed.  Noted also having sore throat, fever, chills, congestion, intermittently productive cough, mild substernal chest discomfort, and dysuria.  She had fallen and hit her head.  This morning however she got to the point in which she was having difficulty breathing.  She had recently had contact with family members who had the flu recently.  She had received her vaccination against the flu and COVID.  The patient chronically has some swelling in her legs that she feels is unchanged.  This morning however she got to the point where she could not catch her breath and EMS was called.  She has not had any significant nausea, vomiting, or diarrhea.  When discussing CODE STATUS patient states that she would like to remain a full code.  Her daughter who is her power of attorney notes that the patient often changes her mind and she has some mild dementia, but is okay with respecting her current wishes to remain a full code at this time.  She does still live at home alone and gets around with use of a walker.  Family and friends come to check on her throughout the day.   ED Course: Upon admission to the emergency department patient was seen to be afebrile, suppressions 18-23, blood pressure 93/47-126/66, and O2 saturations noted to be as low as 90% on room air placed on 2-3 L nasal cannula oxygen.  Chest x-ray  noted large hiatal hernia with mild left pleural effusion with associated atelectasis versus infiltrate.  Labs significant for WBC18.8, hemoglobin 9.6, platelets 148, sodium 130, CO2 29, BUN 17, creatinine 1.14, BNP 591.6, high-sensitivity troponin 23, and lactic acid 1.9.  CT scan of the head noted no acute cranial abnormality and improvement in right parietal scalp hematoma.  Patient had been given lactated Ringer's 500 mL IV fluid bolus, albuterol neb, and Rocephin, and azithromycin.  Assessment & Plan:   Principal Problem:   Community acquired pneumonia Active Problems:   Diaphragmatic hernia   Atrial fibrillation and flutter (HCC)   Coronary artery disease involving native coronary artery of native heart without angina pectoris   Heart failure with preserved ejection fraction (HCC)   Hyponatremia   Acute respiratory failure with hypoxia (HCC)   Severe sepsis ([place])  Acute hypoxic respiratory failure/severe sepsis secondary to suspected community-acquired pneumonia/rhinovirus infection, POA: Patient met severe sepsis criteria upon admission based on tachypnea, leukocytosis, acute hypoxic respiratory failure and pneumonia.  Reported to have close contact with family members with influenza.  Influenza and COVID-19 screening were negative but she was positive for rhinovirus..  She was working to breathe initially, but was never noted to be hypoxic and was placed on nasal cannula oxygen.  Chest x-ray noted mild left pleural effusion with associated left basilar atelectasis or infiltrate.  She received Rocephin and azithromycin.  Urine antigen for streptococci negative but Legionella pending.  Afebrile.  Leukocytosis  improving.  Patient clinically improving but still on 2 to 3 L of oxygen to maintain oxygen saturation over 90%.  Procalcitonin 0.27 we will continue Rocephin and doxycycline.  Wean oxygen as able to.  Ordered and encouraged incentive spirometry.   Heart failure with preserved EF: Based  on my chart review and personally examining the patient, I do not think that patient had acute component of congestive heart failure.  She has chronic diastolic congestive heart failure and she appears euvolemic.  Her BNP although elevated but is very close to her baseline and chest x-ray did not reveal any    Permanent atrial fibrillation: Rates controlled. Continue amiodarone and Eliquis   History of CAD/chest pain/elevated troponin: Patient did report having some mild substernal chest discomfort.  High-sensitivity troponin 23 with repeat 27.  Thought to be relatively flat which suspect possibly related to demand.  CKD stage IIIa: Stable   Normocytic anemia: Stable.   Hyponatremia: Sodium noted to be 130 on admission, now improved to 132.   Fall at home: Patient admits that she had recently fallen at home 2 days ago.  Hematoma to the right scalp was appreciated, but no intracranial abnormalities noted on CT scan.  PT to assess.  Thrombocytopenia: Stable.  No signs of bleeding.   Hypothyroidism: Continue Synthroid.  DVT prophylaxis: apixaban (ELIQUIS) tablet 2.5 mg Start: 01/06/21 2200 SCDs Start: 01/06/21 1312   Code Status: Full Code  Family Communication:  None present at bedside.  Plan of care discussed with patient in length and he verbalized understanding and agreed with it.  Status is: Inpatient  Remains inpatient appropriate because: Needs management of her acute issues.  Estimated body mass index is 26.49 kg/m as calculated from the following:   Height as of this encounter: 4' 7"  (1.397 m).   Weight as of this encounter: 51.7 kg.     Nutritional Assessment: Body mass index is 26.49 kg/m.[name] Seen by dietician.  I agree with the assessment and plan as outlined below: Nutrition Status:  Skin Assessment: I have examined the patient's skin and I agree with the wound assessment as performed by the wound care RN as outlined below:    Consultants:  None  Procedures:   None  Antimicrobials:  Anti-infectives (From admission, onward)    Start     Dose/Rate Route Frequency Ordered Stop   01/07/21 1000  doxycycline (VIBRAMYCIN) 100 mg in sodium chloride 0.9 % 250 mL IVPB        100 mg 125 mL/hr over 120 Minutes Intravenous Every 12 hours 01/06/21 1316     01/06/21 1215  cefTRIAXone (ROCEPHIN) 1 g in sodium chloride 0.9 % 100 mL IVPB        1 g 200 mL/hr over 30 Minutes Intravenous  Once 01/06/21 1208 01/06/21 1250   01/06/21 1215  azithromycin (ZITHROMAX) 500 mg in sodium chloride 0.9 % 250 mL IVPB  Status:  Discontinued        500 mg 250 mL/hr over 60 Minutes Intravenous  Once 01/06/21 1208 01/06/21 1428   01/06/21 1200  cefTRIAXone (ROCEPHIN) 2 g in sodium chloride 0.9 % 100 mL IVPB        2 g 200 mL/hr over 30 Minutes Intravenous Every 24 hours 01/06/21 1316 01/11/21 1159          Subjective: Patient seen and examined.  Fully alert and oriented.  She has no complaints.  She says that her breathing is improving.  Objective: Vitals:   01/07/21 0410  01/07/21 0412 01/07/21 0842 01/07/21 1045  BP: 123/73  (!) 143/61 108/65  Pulse: (!) 55  65 (!) 57  Resp: 17  18 18   Temp: 97.7 F (36.5 C)  97.6 F (36.4 C) 98.2 F (36.8 C)  TempSrc: Oral  Oral Oral  SpO2: 97%     Weight:  51.7 kg    Height:        Intake/Output Summary (Last 24 hours) at 01/07/2021 1318 Last data filed at 01/07/2021 0100 Gross per 24 hour  Intake 370 ml  Output 1050 ml  Net -680 ml   Filed Weights   01/06/21 0922 01/06/21 1500 01/07/21 0412  Weight: 48.5 kg 49.2 kg 51.7 kg    Examination:  General exam: Appears calm and comfortable  Respiratory system: Diminished breath sounds due to poor inspiratory effort, some rhonchi at the left base.  Cardiovascular system: S1 & S2 heard, RRR. No JVD, murmurs, rubs, gallops or clicks. No pedal edema. Gastrointestinal system: Abdomen is nondistended, soft and nontender. No organomegaly or masses felt. Normal bowel sounds  heard. Central nervous system: Alert and oriented. No focal neurological deficits. Extremities: Symmetric 5 x 5 power. Skin: No rashes, lesions or ulcers Psychiatry: Judgement and insight appear normal. Mood & affect appropriate.    Data Reviewed: I have personally reviewed following labs and imaging studies  CBC: Recent Labs  Lab 01/06/21 1025 01/06/21 2215 01/07/21 0324  WBC 18.8*  --  12.4*  NEUTROABS 16.5*  --   --   HGB 9.6* 9.0* 10.3*  HCT 29.3* 27.6* 30.8*  MCV 92.1  --  91.7  PLT 148*  --  340*   Basic Metabolic Panel: Recent Labs  Lab 01/06/21 1025 01/07/21 0324  NA 130* 132*  K 4.6 4.0  CL 97* 96*  CO2 21* 27  GLUCOSE 88 123*  BUN 17 15  CREATININE 1.14* 1.05*  CALCIUM 8.5* 8.5*  MG  --  2.0   GFR: Estimated Creatinine Clearance: 22.6 mL/min (A) (by C-G formula based on SCr of 1.05 mg/dL (H)). Liver Function Tests: No results for input(s): AST, ALT, ALKPHOS, BILITOT, PROT, ALBUMIN in the last 168 hours. No results for input(s): LIPASE, AMYLASE in the last 168 hours. No results for input(s): AMMONIA in the last 168 hours. Coagulation Profile: No results for input(s): INR, PROTIME in the last 168 hours. Cardiac Enzymes: No results for input(s): CKTOTAL, CKMB, CKMBINDEX, TROPONINI in the last 168 hours. BNP (last 3 results) No results for input(s): PROBNP in the last 8760 hours. HbA1C: No results for input(s): HGBA1C in the last 72 hours. CBG: No results for input(s): GLUCAP in the last 168 hours. Lipid Profile: No results for input(s): CHOL, HDL, LDLCALC, TRIG, CHOLHDL, LDLDIRECT in the last 72 hours. Thyroid Function Tests: Recent Labs    01/06/21 2215  TSH 2.214   Anemia Panel: No results for input(s): VITAMINB12, FOLATE, FERRITIN, TIBC, IRON, RETICCTPCT in the last 72 hours. Sepsis Labs: Recent Labs  Lab 01/06/21 1025 01/06/21 1546  PROCALCITON  --  0.27  LATICACIDVEN 1.9  --     Recent Results (from the past 240 hour(s))  Resp Panel  by RT-PCR (Flu A&B, Covid) Nasopharyngeal Swab     Status: None   Collection Time: 01/06/21  9:47 AM   Specimen: Nasopharyngeal Swab; Nasopharyngeal(NP) swabs in vial transport medium  Result Value Ref Range Status   SARS Coronavirus 2 by RT PCR NEGATIVE NEGATIVE Final    Comment: (NOTE) SARS-CoV-2 target nucleic acids are NOT  DETECTED.  The SARS-CoV-2 RNA is generally detectable in upper respiratory specimens during the acute phase of infection. The lowest concentration of SARS-CoV-2 viral copies this assay can detect is 138 copies/mL. A negative result does not preclude SARS-Cov-2 infection and should not be used as the sole basis for treatment or other patient management decisions. A negative result may occur with  improper specimen collection/handling, submission of specimen other than nasopharyngeal swab, presence of viral mutation(s) within the areas targeted by this assay, and inadequate number of viral copies(<138 copies/mL). A negative result must be combined with clinical observations, patient history, and epidemiological information. The expected result is Negative.  Fact Sheet for Patients:  EntrepreneurPulse.com.au  Fact Sheet for Healthcare Providers:  IncredibleEmployment.be  This test is no t yet approved or cleared by the [place] FDA and  has been authorized for detection and/or diagnosis of SARS-CoV-2 by FDA under an Emergency Use Authorization (EUA). This EUA will remain  in effect (meaning this test can be used) for the duration of the COVID-19 declaration under Section 564(b)(1) of the Act, 21 U.S.C.section 360bbb-3(b)(1), unless the authorization is terminated  or revoked sooner.       Influenza A by PCR NEGATIVE NEGATIVE Final   Influenza B by PCR NEGATIVE NEGATIVE Final    Comment: (NOTE) The Xpert Xpress SARS-CoV-2/FLU/RSV plus assay is intended as an aid in the diagnosis of influenza from Nasopharyngeal swab  specimens and should not be used as a sole basis for treatment. Nasal washings and aspirates are unacceptable for Xpert Xpress SARS-CoV-2/FLU/RSV testing.  Fact Sheet for Patients: EntrepreneurPulse.com.au  Fact Sheet for Healthcare Providers: IncredibleEmployment.be  This test is not yet approved or cleared by the [place] FDA and has been authorized for detection and/or diagnosis of SARS-CoV-2 by FDA under an Emergency Use Authorization (EUA). This EUA will remain in effect (meaning this test can be used) for the duration of the COVID-19 declaration under Section 564(b)(1) of the Act, 21 U.S.C. section 360bbb-3(b)(1), unless the authorization is terminated or revoked.  Performed at [facility], [place] [address], [place], [place] [address]   Respiratory (~20 pathogens) panel by PCR     Status: Abnormal   Collection Time: 01/06/21  8:18 PM   Specimen: Nasopharyngeal Swab; Respiratory  Result Value Ref Range Status   Adenovirus NOT DETECTED NOT DETECTED Final   Coronavirus 229E NOT DETECTED NOT DETECTED Final    Comment: (NOTE) The Coronavirus on the Respiratory Panel, DOES NOT test for the novel  Coronavirus (2019 nCoV)    Coronavirus HKU1 NOT DETECTED NOT DETECTED Final   Coronavirus NL63 NOT DETECTED NOT DETECTED Final   Coronavirus OC43 NOT DETECTED NOT DETECTED Final   Metapneumovirus NOT DETECTED NOT DETECTED Final   Rhinovirus / Enterovirus DETECTED (A) NOT DETECTED Final   Influenza A NOT DETECTED NOT DETECTED Final   Influenza B NOT DETECTED NOT DETECTED Final   Parainfluenza Virus 1 NOT DETECTED NOT DETECTED Final   Parainfluenza Virus 2 NOT DETECTED NOT DETECTED Final   Parainfluenza Virus 3 NOT DETECTED NOT DETECTED Final   Parainfluenza Virus 4 NOT DETECTED NOT DETECTED Final   Respiratory Syncytial Virus NOT DETECTED NOT DETECTED Final   Bordetella pertussis NOT DETECTED NOT DETECTED Final   Bordetella  Parapertussis NOT DETECTED NOT DETECTED Final   Chlamydophila pneumoniae NOT DETECTED NOT DETECTED Final   Mycoplasma pneumoniae NOT DETECTED NOT DETECTED Final    Comment: Performed at [facility], [place]. [place],  [place] [address]      Radiology Studies: CT Head Wo Contrast  Result Date: 01/06/2021 CLINICAL DATA:  Head trauma, minor (Age >= 65y) recent head trauma, fever, cough EXAM: CT HEAD WITHOUT CONTRAST TECHNIQUE: Contiguous axial images were obtained from the base of the skull through the vertex without intravenous contrast. COMPARISON:  Head CT 12/28/2020 FINDINGS: Brain: No evidence of acute intracranial hemorrhage or extra-axial collection. The ventricles are unchanged in size.Scattered subcortical and periventricular white matter hypodensities, nonspecific but likely sequela of chronic small vessel ischemic disease.Mild cerebral atrophy Vascular: Stable vascular calcifications. Skull: Normal. Negative for fracture or focal lesion. Sinuses/Orbits: Mucosal thickening of the anterior ethmoid air cells. Small mucous retention cyst in the left maxillary sinus. The mastoid air cells are clear. Other: Slight improvement in the right high parietal scalp hematoma. IMPRESSION: No acute intracranial abnormality. Unchanged sequela of chronic small vessel ischemic disease and cerebral atrophy. Slight improvement in the right high parietal scalp hematoma. Electronically Signed   By: Maurine Simmering M.D.   On: 01/06/2021 12:03   DG Chest Port 1 View  Result Date: 01/06/2021 CLINICAL DATA:  Shortness of breath, fever, cough. EXAM: PORTABLE CHEST 1 VIEW COMPARISON:  December 28, 2020. FINDINGS: Stable cardiomegaly. Large hiatal hernia. Right lung is clear. Mild left pleural effusion is noted with associated left basilar atelectasis or infiltrate. Bony thorax is unremarkable. IMPRESSION: Large hiatal hernia. Mild left pleural effusion is noted with associated left basilar atelectasis or  infiltrate. Electronically Signed   By: Marijo Conception M.D.   On: 01/06/2021 10:18    Scheduled Meds:  acetaminophen  650 mg Oral Q8H   amiodarone  200 mg Oral Daily   apixaban  2.5 mg Oral BID   atorvastatin  20 mg Oral q1800   bacitracin  1 application Topical BID   darifenacin  15 mg Oral Daily   furosemide  20 mg Intravenous Daily   guaiFENesin  600 mg Oral BID   isosorbide mononitrate  60 mg Oral Daily   levothyroxine  62.5 mcg Oral QAC breakfast   predniSONE  40 mg Oral Q breakfast   saccharomyces boulardii  250 mg Oral BID   sodium chloride flush  3 mL Intravenous Q12H   Continuous Infusions:  cefTRIAXone (ROCEPHIN)  IV 2 g (01/07/21 1315)   doxycycline (VIBRAMYCIN) IV 100 mg (01/07/21 0903)     LOS: 1 day   Time spent: 36 minutes   Darliss Cheney, MD [facility]  01/07/2021, 1:18 PM  Please page via [place] and do not message via secure chat for anything urgent. Secure chat can be used for anything non urgent.  How to contact the [facility] Attending or Consulting provider [place] or covering provider during after hours [place], for this patient?  Check the care team in [facility] and look for a) attending/consulting [facility] provider listed and b) the [facility] team listed. Page or secure chat [phone]. Log into [email or web address] and use [place]'s universal password to access. If you do not have the password, please contact the hospital operator. Locate the [facility] provider you are looking for under [facility] and page to a number that you can be directly reached. If you still have difficulty reaching the provider, please page the [facility] (Director on Call) for the Hospitalists listed on amion for assistance.

## 2021-01-07 NOTE — Plan of Care (Signed)
  Problem: Clinical Measurements: Goal: Will remain free from infection Outcome: Progressing Goal: Diagnostic test results will improve Outcome: Progressing Goal: Cardiovascular complication will be avoided Outcome: Progressing   Problem: Nutrition: Goal: Adequate nutrition will be maintained Outcome: Progressing   

## 2021-01-07 NOTE — Progress Notes (Signed)
Mobility Specialist Progress Note:   01/07/21 1138  Mobility  Activity Ambulated to bathroom  Level of Assistance Standby assist, set-up cues, supervision of patient - no hands on  Assistive Device Front wheel walker  Distance Ambulated (ft) 40 ft  Mobility Ambulated with assistance in room  Mobility Response Tolerated well  Mobility performed by Mobility specialist  $Mobility charge 1 Mobility   Pre- Mobility:  68 HR During Mobility: 78 HR Post Mobility:   61 HR  Pt received in bed willing to participate in mobility. No complaints of pain and asymptomatic. Pt returned to bed with call bell in reach and all needs met.   [facility] Health and safety inspector Phone [phone]

## 2021-01-07 NOTE — Progress Notes (Signed)
TRH night cross cover note:  I was notified by patient's RN that the patient has woken up agitated this morning, confused, pulling at her lines, attempting to get out of bed, with this agitation refractory to attempts for verbal redirection. In the setting of this refractory agitation posing an inhibitory risk towards provision of medical care, I have ordered Haldol 2 mg IV x1 dose now.   RN also conveyed that previously ordered UA results are now available, and appears to show the presence of significant pyuria and large leukocyte esterase, suspected to be consistent with urinary tract infection given review of the history provided in yesterday's H&P.  It is noted that the patient is already on Rocephin and doxycycline for community-acquired pneumonia coverage, with Rocephin likely providing adequate empiric coverage for UTI.  I have placed an order for urine culture to be added on.     Babs Bertin, DO Hospitalist

## 2021-01-07 NOTE — Progress Notes (Signed)
PT Cancellation Note  Patient Details Name: Cathy [name] MRN: [id number] DOB: 08-28-1927   Cancelled Treatment:    Reason Eval/Treat Not Completed: Fatigue/lethargy limiting ability to participate. Pt lethargic and difficult to keep awake. Pt stating "let me rest", even after encouragement to mobilize. PT will plan to follow-up tomorrow.   Moishe Spice, PT, DPT Acute Rehabilitation Services  Pager: [phone] Office: [place] 01/07/2021, 4:30 PM

## 2021-01-07 NOTE — Progress Notes (Signed)
Pt oxygen dropped to the low 80's whiles sleeping on 2L [place], pt oxygen increased to 4L and oxygen sat remained in the 90's during the shift. Pt woke up very confused and agitated as noted in on-call MD note. Pt refused to take her am medication until she see and speak with MD about her medicine. Pt distrustful of RN and very paranoid. Charge RN aware of pt behavior and reported off to Public house manager. Delia Heady RN

## 2021-01-08 DIAGNOSIS — J189 Pneumonia, unspecified organism: Secondary | ICD-10-CM | POA: Diagnosis not present

## 2021-01-08 LAB — BASIC METABOLIC PANEL
Anion gap: 6 (ref 5–15)
BUN: 25 mg/dL — ABNORMAL HIGH (ref 8–23)
CO2: 27 mmol/L (ref 22–32)
Calcium: 8.2 mg/dL — ABNORMAL LOW (ref 8.9–10.3)
Chloride: 96 mmol/L — ABNORMAL LOW (ref 98–111)
Creatinine, Ser: 0.98 mg/dL (ref 0.44–1.00)
GFR, Estimated: 54 mL/min — ABNORMAL LOW (ref >60)
Glucose, Bld: 103 mg/dL — ABNORMAL HIGH (ref 70–99)
Potassium: 3.5 mmol/L (ref 3.5–5.1)
Sodium: 129 mmol/L — ABNORMAL LOW (ref 135–145)

## 2021-01-08 LAB — CBC WITH DIFFERENTIAL/PLATELET
Abs Immature Granulocytes: 0.13 10*3/uL — ABNORMAL HIGH (ref 0.00–0.07)
Basophils Absolute: 0 10*3/uL (ref 0.0–0.1)
Basophils Relative: 0 %
Eosinophils Absolute: 0 10*3/uL (ref 0.0–0.5)
Eosinophils Relative: 0 %
HCT: 28.8 % — ABNORMAL LOW (ref 36.0–46.0)
Hemoglobin: 9.5 g/dL — ABNORMAL LOW (ref 12.0–15.0)
Immature Granulocytes: 1 %
Lymphocytes Relative: 3 %
Lymphs Abs: 0.6 10*3/uL — ABNORMAL LOW (ref 0.7–4.0)
MCH: 30.4 pg (ref 26.0–34.0)
MCHC: 33 g/dL (ref 30.0–36.0)
MCV: 92 fL (ref 80.0–100.0)
Monocytes Absolute: 1.4 10*3/uL — ABNORMAL HIGH (ref 0.1–1.0)
Monocytes Relative: 8 %
Neutro Abs: 15.3 10*3/uL — ABNORMAL HIGH (ref 1.7–7.7)
Neutrophils Relative %: 88 %
Platelets: 193 10*3/uL (ref 150–400)
RBC: 3.13 MIL/uL — ABNORMAL LOW (ref 3.87–5.11)
RDW: 15.2 % (ref 11.5–15.5)
WBC: 17.5 10*3/uL — ABNORMAL HIGH (ref 4.0–10.5)
nRBC: 0 % (ref 0.0–0.2)

## 2021-01-08 LAB — OSMOLALITY, URINE: Osmolality, Ur: 440 mOsm/kg (ref 300–900)

## 2021-01-08 LAB — SODIUM, URINE, RANDOM: Sodium, Ur: 69 mmol/L

## 2021-01-08 LAB — LEGIONELLA PNEUMOPHILA SEROGP 1 UR AG: L. pneumophila Serogp 1 Ur Ag: NEGATIVE

## 2021-01-08 LAB — MAGNESIUM: Magnesium: 1.9 mg/dL (ref 1.7–2.4)

## 2021-01-08 LAB — OSMOLALITY: Osmolality: 274 mOsm/kg — ABNORMAL LOW (ref 275–295)

## 2021-01-08 NOTE — TOC Initial Note (Signed)
Transition of Care [facility]) - Initial/Assessment Note    Patient Details  Name: Cathy [name] MRN: [id number] Date of Birth: 09-24-1927  Transition of Care [facility]) CM/SW Contact:    Verdell Carmine, RN Phone Number: 01/08/2021, 2:30 PM  Clinical Narrative:                 87 year old from home by self . Dil works duirng day she has PCA assistance for a few hours during day,PT and OT recommedned SNF. Patient saturation qualifications reveals she needs O2 at 3LPM. CSW consulted for possible PACE program. Order in for oxygen if she decides against SNF, as she had stated that she wanted to be in her home for as long as she could.  CM/CSW will follow for needs, recommendations, and transitions  Expected Discharge Plan: [place] Barriers to Discharge: Continued Medical Work up   Patient Goals and CMS Choice        Expected Discharge Plan and Services Expected Discharge Plan: [place] In-house Referral: Clinical Social Work Discharge Planning Services: CM Consult   Living arrangements for the past 2 months: [place]                                      Prior Living Arrangements/Services Living arrangements for the past 2 months: Single Family Home Lives with:: Adult Children Patient language and need for interpreter reviewed:: Yes        Need for Family Participation in Patient Care: Yes (Comment) Care giver support system in place?: Yes (comment)   Criminal Activity/Legal Involvement Pertinent to Current Situation/Hospitalization: No - Comment as needed  Activities of Daily Living   ADL Screening (condition at time of admission) Is the patient deaf or have difficulty hearing?: Yes Does the patient have difficulty seeing, even when wearing glasses/contacts?: Yes Does the patient have difficulty concentrating, remembering, or making decisions?: No Patient able to express need for assistance with ADLs?: Yes Does the  patient have difficulty dressing or bathing?: Yes Does the patient have difficulty walking or climbing stairs?: Yes  Permission Sought/Granted                  Emotional Assessment       Orientation: : Oriented to Self, Oriented to Place, Oriented to  Time, Fluctuating Orientation (Suspected and/or reported Sundowners) Alcohol / Substance Use: Not Applicable Psych Involvement: No (comment)  Admission diagnosis:  Acute respiratory distress [R06.03] Hypoxia [R09.02] Community acquired pneumonia of left lower lobe of lung [J18.9] Patient Active Problem List   Diagnosis Date Noted   Acute respiratory failure with hypoxia ([place]) 01/07/2021   Severe sepsis ([place]) 01/07/2021   Acute respiratory distress 01/06/2021   Community acquired pneumonia 01/06/2021   Heart failure with preserved ejection fraction ([place]) 01/06/2021   Hyponatremia 01/06/2021   SIRS (systemic inflammatory response syndrome) ([place]) 01/06/2021   Primary osteoarthritis of both hands 07/06/2019   Hip fracture ([place]) 06/29/2019   Closed fracture of neck of left femur ([place]) 06/28/2019   GERD without esophagitis 06/28/2019   Noninfected skin tear of leg, right, initial encounter 04/19/2018   Bowel obstruction ([place]) 02/21/2018   Hypothyroidism 02/21/2018   Osteoarthritis of left shoulder 08/10/2017   Pain in joint of left shoulder 08/10/2017   Mass of joint of left wrist 06/22/2017   Arthritis of left wrist 06/21/2017   Carpal  tunnel syndrome 06/21/2017   Tenosynovitis of left wrist 06/21/2017   Fracture of distal end of humerus 03/25/2017   Closed supracondylar fracture of left humerus 01/15/2017   Gait disorder 01/15/2017   Recurrent falls 01/15/2017   Rib fractures 11/28/2016   Skin lesion 09/30/2016   Ganglion cyst of dorsum of left wrist 09/30/2016   Rotator cuff arthropathy, right 04/26/2016   Hyperglycemia 04/01/2016   Right arm pain 04/01/2016   Wrist swelling 09/30/2015   Preventative health care  04/02/2015   Chronic combined systolic and diastolic CHF (congestive heart failure) ([place]) 03/17/2015   Coronary artery calcification seen on CT scan 03/17/2015   Sinus bradycardia 03/17/2015   Splenic lesion 02/18/2015   Liver lesion 02/18/2015   Coronary artery disease involving native coronary artery of native heart without angina pectoris 02/03/2015   Atrial fibrillation and flutter ([place]) 02/02/2015   Fatigue 01/31/2015   Abnormal CT of the abdomen 01/31/2015   Prolonged Q-T interval on ECG 01/31/2015   Hepatic lesion    Chronic constipation 12/03/2014   Lower extremity edema 04/25/2014   Peripheral neuropathy ([place]) 12/10/2013   Overactive bladder 12/27/2011   Senile osteoporosis 12/27/2011   Vitamin B12 deficiency 05/03/2010   Iron deficiency anemia 05/03/2010   Normocytic anemia 05/03/2010   VENOUS INSUFFICIENCY 01/27/2009   BACK PAIN, LUMBAR 01/27/2008   URINARY INCONTINENCE 08/23/2007   DIVERTICULOSIS OF COLON 04/10/2007   HYPERCHOLESTEROLEMIA 02/13/2007   ANXIETY 02/13/2007   Essential hypertension 02/13/2007   Diaphragmatic hernia 02/13/2007   Irritable bowel syndrome 02/13/2007   DEGENERATIVE JOINT DISEASE 02/13/2007   Allergic rhinitis 02/13/2007   PCP:  Biagio Borg, MD Pharmacy:   [facility] [place] [place] [facility] [address] [place] [address] Phone: [phone] Fax: [phone]  [id number] [address], [place] - [place] [address] Phone: [phone] Fax: [phone]  [id number] [place] [address] Phone: [phone] Fax: [phone]     Social Determinants of Health (SDOH) Interventions    Readmission Risk Interventions No flowsheet data found.

## 2021-01-08 NOTE — TOC Initial Note (Deleted)
Transition of Care [facility]) - Initial/Assessment Note    Patient Details  Name: Cathy [name] MRN: [id number] Date of Birth: 12-03-1927  Transition of Care [facility]) CM/SW Contact:    Tresa Endo Phone Number: 01/08/2021, 4:39 PM  Clinical Narrative:                 CSW received SNF consult. CSW met with pt daughter in law, CSW introduced self and explained role at the hospital. Pt reports that PTA the pt was from home alone and states she would like to return. PT reports functional ADLS, pt used a 2 wheeled walker while working with PT. PT states functioning mod I for household mobility with a RW, intermittently using a broom as a cane.  CSW reviewed PT/OT recommendations for SNF. Pt reports being agreeable to SNF. Pt gave CSW permission to fax out to facilities in the area. Pt preferred [facility] and [facility] CSW contacted [facility] and they have no bed availability.CSW gave pt medicare.gov rating list to review along with long term facilities. PT is fully vaccinated.   CSW will continue to follow.    Expected Discharge Plan: [place] Barriers to Discharge: Continued Medical Work up   Patient Goals and CMS Choice        Expected Discharge Plan and Services Expected Discharge Plan: [place] In-house Referral: Clinical Social Work Discharge Planning Services: CM Consult   Living arrangements for the past 2 months: [place]                                      Prior Living Arrangements/Services Living arrangements for the past 2 months: Single Family Home Lives with:: Adult Children Patient language and need for interpreter reviewed:: Yes        Need for Family Participation in Patient Care: Yes (Comment) Care giver support system in place?: Yes (comment)   Criminal Activity/Legal Involvement Pertinent to Current Situation/Hospitalization: No - Comment as needed  Activities of Daily Living   ADL  Screening (condition at time of admission) Is the patient deaf or have difficulty hearing?: Yes Does the patient have difficulty seeing, even when wearing glasses/contacts?: Yes Does the patient have difficulty concentrating, remembering, or making decisions?: No Patient able to express need for assistance with ADLs?: Yes Does the patient have difficulty dressing or bathing?: Yes Does the patient have difficulty walking or climbing stairs?: Yes  Permission Sought/Granted                  Emotional Assessment       Orientation: : Oriented to Self, Oriented to Place, Oriented to  Time, Fluctuating Orientation (Suspected and/or reported Sundowners) Alcohol / Substance Use: Not Applicable Psych Involvement: No (comment)  Admission diagnosis:  Acute respiratory distress [R06.03] Hypoxia [R09.02] Community acquired pneumonia of left lower lobe of lung [J18.9] Patient Active Problem List   Diagnosis Date Noted   Acute respiratory failure with hypoxia ([place]) 01/07/2021   Severe sepsis ([place]) 01/07/2021   Acute respiratory distress 01/06/2021   Community acquired pneumonia 01/06/2021   Heart failure with preserved ejection fraction ([place]) 01/06/2021   Hyponatremia 01/06/2021   SIRS (systemic inflammatory response syndrome) ([place]) 01/06/2021   Primary osteoarthritis of both hands 07/06/2019   Hip fracture ([place]) 06/29/2019   Closed fracture of neck of left femur ([place]) 06/28/2019   GERD without esophagitis  06/28/2019   Noninfected skin tear of leg, right, initial encounter 04/19/2018   Bowel obstruction ([place]) 02/21/2018   Hypothyroidism 02/21/2018   Osteoarthritis of left shoulder 08/10/2017   Pain in joint of left shoulder 08/10/2017   Mass of joint of left wrist 06/22/2017   Arthritis of left wrist 06/21/2017   Carpal tunnel syndrome 06/21/2017   Tenosynovitis of left wrist 06/21/2017   Fracture of distal end of humerus 03/25/2017   Closed supracondylar fracture of left humerus  01/15/2017   Gait disorder 01/15/2017   Recurrent falls 01/15/2017   Rib fractures 11/28/2016   Skin lesion 09/30/2016   Ganglion cyst of dorsum of left wrist 09/30/2016   Rotator cuff arthropathy, right 04/26/2016   Hyperglycemia 04/01/2016   Right arm pain 04/01/2016   Wrist swelling 09/30/2015   Preventative health care 04/02/2015   Chronic combined systolic and diastolic CHF (congestive heart failure) ([place]) 03/17/2015   Coronary artery calcification seen on CT scan 03/17/2015   Sinus bradycardia 03/17/2015   Splenic lesion 02/18/2015   Liver lesion 02/18/2015   Coronary artery disease involving native coronary artery of native heart without angina pectoris 02/03/2015   Atrial fibrillation and flutter ([place]) 02/02/2015   Fatigue 01/31/2015   Abnormal CT of the abdomen 01/31/2015   Prolonged Q-T interval on ECG 01/31/2015   Hepatic lesion    Chronic constipation 12/03/2014   Lower extremity edema 04/25/2014   Peripheral neuropathy ([place]) 12/10/2013   Overactive bladder 12/27/2011   Senile osteoporosis 12/27/2011   Vitamin B12 deficiency 05/03/2010   Iron deficiency anemia 05/03/2010   Normocytic anemia 05/03/2010   VENOUS INSUFFICIENCY 01/27/2009   BACK PAIN, LUMBAR 01/27/2008   URINARY INCONTINENCE 08/23/2007   DIVERTICULOSIS OF COLON 04/10/2007   HYPERCHOLESTEROLEMIA 02/13/2007   ANXIETY 02/13/2007   Essential hypertension 02/13/2007   Diaphragmatic hernia 02/13/2007   Irritable bowel syndrome 02/13/2007   DEGENERATIVE JOINT DISEASE 02/13/2007   Allergic rhinitis 02/13/2007   PCP:  Biagio Borg, MD Pharmacy:   [facility] [place] [place] to [address] [place] [address] Phone: [phone] Fax: [phone]  [id number] [address] [id number]: [phone]: [phone] [id number] [place]: [phone]: [phone]   Social Determinants of Health (SDOH) Interventions    Readmission Risk Interventions No flowsheet data found.

## 2021-01-08 NOTE — Progress Notes (Signed)
PROGRESS NOTE    TENIYAH [name]  UVO:[id number] DOB: 1927/04/09 DOA: 01/06/2021 PCP: Biagio Borg, MD   Brief Narrative:  HPI: Cathy [name] is a 87 y.o. female with medical history significant of HTN, HLD, HFpEF, paroxysmal atrial flutter, hiatal hernia, and osteoarthritis who presents with complaints of cough and shortness of breath.  History is obtained from the patient and additional information is obtained over the phone from her daughter.  Patient reports that she started not feeling well approximately 2 days ago.  She complained of having diffuse body aches to the point where she hardly to get out of bed.  Noted also having sore throat, fever, chills, congestion, intermittently productive cough, mild substernal chest discomfort, and dysuria.  She had fallen and hit her head.  This morning however she got to the point in which she was having difficulty breathing.  She had recently had contact with family members who had the flu recently.  She had received her vaccination against the flu and COVID.  The patient chronically has some swelling in her legs that she feels is unchanged.  This morning however she got to the point where she could not catch her breath and EMS was called.  She has not had any significant nausea, vomiting, or diarrhea.  When discussing CODE STATUS patient states that she would like to remain a full code.  Her daughter who is her power of attorney notes that the patient often changes her mind and she has some mild dementia, but is okay with respecting her current wishes to remain a full code at this time.  She does still live at home alone and gets around with use of a walker.  Family and friends come to check on her throughout the day.   ED Course: Upon admission to the emergency department patient was seen to be afebrile, suppressions 18-23, blood pressure 93/47-126/66, and O2 saturations noted to be as low as 90% on room air placed on 2-3 L nasal cannula oxygen.  Chest x-ray  noted large hiatal hernia with mild left pleural effusion with associated atelectasis versus infiltrate.  Labs significant for WBC18.8, hemoglobin 9.6, platelets 148, sodium 130, CO2 29, BUN 17, creatinine 1.14, BNP 591.6, high-sensitivity troponin 23, and lactic acid 1.9.  CT scan of the head noted no acute cranial abnormality and improvement in right parietal scalp hematoma.  Patient had been given lactated Ringer's 500 mL IV fluid bolus, albuterol neb, and Rocephin, and azithromycin.  Assessment & Plan:   Principal Problem:   Community acquired pneumonia Active Problems:   Diaphragmatic hernia   Atrial fibrillation and flutter (HCC)   Coronary artery disease involving native coronary artery of native heart without angina pectoris   Heart failure with preserved ejection fraction (HCC)   Hyponatremia   Acute respiratory failure with hypoxia (HCC)   Severe sepsis ([place])  Acute hypoxic respiratory failure/severe sepsis secondary to suspected community-acquired pneumonia/rhinovirus infection, POA: Patient met severe sepsis criteria upon admission based on tachypnea, leukocytosis, acute hypoxic respiratory failure and pneumonia.  Reported to have close contact with family members with influenza.  Influenza and COVID-19 screening were negative but she was positive for rhinovirus. Chest x-ray noted mild left pleural effusion with associated left basilar atelectasis or infiltrate.  She received Rocephin and azithromycin.  Urine antigen for streptococci negative but Legionella pending.  Afebrile.  Leukocytosis worse today.  Patient clinically improving and now on room air.  Procalcitonin 0.27 we will continue Rocephin and doxycycline.  Encouraged  incentive spirometry.   Heart failure with preserved EF: Based on my chart review and personally examining the patient, I do not think that patient had acute component of congestive heart failure.  She has chronic diastolic congestive heart failure and she appears  euvolemic.  Her BNP although elevated but is very close to her baseline and chest x-ray did not reveal any Signs of pulmonary edema.  Continue home dose of Lasix.    Permanent atrial fibrillation: Rates controlled. Continue amiodarone and Eliquis   History of CAD/chest pain/elevated troponin: Patient did report having some mild substernal chest discomfort.  High-sensitivity troponin 23 with repeat 27.  Thought to be relatively flat which suspect possibly related to demand.  CKD stage IIIa: Stable   Normocytic anemia: Stable.   Hyponatremia: Down to 129 again.  We will check urine sodium, urine and serum osmolarity.   Fall at home: Patient admits that she had recently fallen at home 2 days ago.  Hematoma to the right scalp was appreciated, but no intracranial abnormalities noted on CT scan.  She was not seen by PT, she refused because she was tired but patient says that she never refused that.  Awaiting PT to see her so we can find out if she will need to go to SNF which is what I suspect. Questions Thrombocytopenia: Stable.  No signs of bleeding.   Hypothyroidism: Continue Synthroid.  DVT prophylaxis: apixaban (ELIQUIS) tablet 2.5 mg Start: 01/06/21 2200 SCDs Start: 01/06/21 1312   Code Status: Full Code  Family Communication: Daughter-in-law present at bedside.  Status is: Inpatient  Remains inpatient appropriate because: Needs management of her acute issues.  Estimated body mass index is 25.52 kg/m as calculated from the following:   Height as of this encounter: 4' 7"  (1.397 m).   Weight as of this encounter: 49.8 kg.     Nutritional Assessment: Body mass index is 25.52 kg/m.[name] Seen by dietician.  I agree with the assessment and plan as outlined below: Nutrition Status:  Skin Assessment: I have examined the patient's skin and I agree with the wound assessment as performed by the wound care RN as outlined below:    Consultants:  None  Procedures:   None  Antimicrobials:  Anti-infectives (From admission, onward)    Start     Dose/Rate Route Frequency Ordered Stop   01/07/21 1000  doxycycline (VIBRAMYCIN) 100 mg in sodium chloride 0.9 % 250 mL IVPB        100 mg 125 mL/hr over 120 Minutes Intravenous Every 12 hours 01/06/21 1316     01/06/21 1215  cefTRIAXone (ROCEPHIN) 1 g in sodium chloride 0.9 % 100 mL IVPB        1 g 200 mL/hr over 30 Minutes Intravenous  Once 01/06/21 1208 01/06/21 1250   01/06/21 1215  azithromycin (ZITHROMAX) 500 mg in sodium chloride 0.9 % 250 mL IVPB  Status:  Discontinued        500 mg 250 mL/hr over 60 Minutes Intravenous  Once 01/06/21 1208 01/06/21 1428   01/06/21 1200  cefTRIAXone (ROCEPHIN) 2 g in sodium chloride 0.9 % 100 mL IVPB        2 g 200 mL/hr over 30 Minutes Intravenous Every 24 hours 01/06/21 1316 01/11/21 1159          Subjective:  Patient seen and examined.  Daughter-in-law at the bedside.  Patient states that she was still having some shortness of breath however she did not look dyspneic.  She was eating  her breakfast and looked comfortable.  Objective: Vitals:   01/07/21 1958 01/08/21 0500 01/08/21 0849 01/08/21 1114  BP: 130/70 139/81 (!) 150/70 (!) 101/49  Pulse:   (!) 51 (!) 56  Resp: 17 16  17   Temp: 98.2 F (36.8 C) 97.7 F (36.5 C)  (!) 97.4 F (36.3 C)  TempSrc: Oral Oral  Oral  SpO2: 96% 95%  94%  Weight:  49.8 kg    Height:        Intake/Output Summary (Last 24 hours) at 01/08/2021 1252 Last data filed at 01/08/2021 1215 Gross per 24 hour  Intake 1317.05 ml  Output 600 ml  Net 717.05 ml    Filed Weights   01/06/21 1500 01/07/21 0412 01/08/21 0500  Weight: 49.2 kg 51.7 kg 49.8 kg    Examination:  General exam: Appears calm and comfortable  Respiratory system: Rhonchi bilaterally. Respiratory effort normal. Cardiovascular system: S1 & S2 heard, RRR. No JVD, murmurs, rubs, gallops or clicks. No pedal edema. Gastrointestinal system: Abdomen is  nondistended, soft and nontender. No organomegaly or masses felt. Normal bowel sounds heard. Central nervous system: Alert and oriented. No focal neurological deficits. Extremities: Symmetric 5 x 5 power. Skin: No rashes, lesions or ulcers.  Psychiatry: Judgement and insight appear normal. Mood & affect appropriate.   Data Reviewed: I have personally reviewed following labs and imaging studies  CBC: Recent Labs  Lab 01/06/21 1025 01/06/21 2215 01/07/21 0324 01/08/21 0338  WBC 18.8*  --  12.4* 17.5*  NEUTROABS 16.5*  --   --  15.3*  HGB 9.6* 9.0* 10.3* 9.5*  HCT 29.3* 27.6* 30.8* 28.8*  MCV 92.1  --  91.7 92.0  PLT 148*  --  144* 675    Basic Metabolic Panel: Recent Labs  Lab 01/06/21 1025 01/07/21 0324 01/08/21 0338  NA 130* 132* 129*  K 4.6 4.0 3.5  CL 97* 96* 96*  CO2 21* 27 27  GLUCOSE 88 123* 103*  BUN 17 15 25*  CREATININE 1.14* 1.05* 0.98  CALCIUM 8.5* 8.5* 8.2*  MG  --  2.0 1.9    GFR: Estimated Creatinine Clearance: 23.8 mL/min (by C-G formula based on SCr of 0.98 mg/dL). Liver Function Tests: No results for input(s): AST, ALT, ALKPHOS, BILITOT, PROT, ALBUMIN in the last 168 hours. No results for input(s): LIPASE, AMYLASE in the last 168 hours. No results for input(s): AMMONIA in the last 168 hours. Coagulation Profile: No results for input(s): INR, PROTIME in the last 168 hours. Cardiac Enzymes: No results for input(s): CKTOTAL, CKMB, CKMBINDEX, TROPONINI in the last 168 hours. BNP (last 3 results) No results for input(s): PROBNP in the last 8760 hours. HbA1C: No results for input(s): HGBA1C in the last 72 hours. CBG: No results for input(s): GLUCAP in the last 168 hours. Lipid Profile: No results for input(s): CHOL, HDL, LDLCALC, TRIG, CHOLHDL, LDLDIRECT in the last 72 hours. Thyroid Function Tests: Recent Labs    01/06/21 2215  TSH 2.214    Anemia Panel: No results for input(s): VITAMINB12, FOLATE, FERRITIN, TIBC, IRON, RETICCTPCT in the  last 72 hours. Sepsis Labs: Recent Labs  Lab 01/06/21 1025 01/06/21 1546  PROCALCITON  --  0.27  LATICACIDVEN 1.9  --      Recent Results (from the past 240 hour(s))  Resp Panel by RT-PCR (Flu A&B, Covid) Nasopharyngeal Swab     Status: None   Collection Time: 01/06/21  9:47 AM   Specimen: Nasopharyngeal Swab; Nasopharyngeal(NP) swabs in vial transport medium  Result  Value Ref Range Status   SARS Coronavirus 2 by RT PCR NEGATIVE NEGATIVE Final    Comment: (NOTE) SARS-CoV-2 target nucleic acids are NOT DETECTED.  The SARS-CoV-2 RNA is generally detectable in upper respiratory specimens during the acute phase of infection. The lowest concentration of SARS-CoV-2 viral copies this assay can detect is 138 copies/mL. A negative result does not preclude SARS-Cov-2 infection and should not be used as the sole basis for treatment or other patient management decisions. A negative result may occur with  improper specimen collection/handling, submission of specimen other than nasopharyngeal swab, presence of viral mutation(s) within the areas targeted by this assay, and inadequate number of viral copies(<138 copies/mL). A negative result must be combined with clinical observations, patient history, and epidemiological information. The expected result is Negative.  Fact Sheet for Patients:  EntrepreneurPulse.com.au  Fact Sheet for Healthcare Providers:  IncredibleEmployment.be  This test is no t yet approved or cleared by the [place] FDA and  has been authorized for detection and/or diagnosis of SARS-CoV-2 by FDA under an Emergency Use Authorization (EUA). This EUA will remain  in effect (meaning this test can be used) for the duration of the COVID-19 declaration under Section 564(b)(1) of the Act, 21 U.S.C.section 360bbb-3(b)(1), unless the authorization is terminated  or revoked sooner.       Influenza A by PCR NEGATIVE NEGATIVE Final    Influenza B by PCR NEGATIVE NEGATIVE Final    Comment: (NOTE) The Xpert Xpress SARS-CoV-2/FLU/RSV plus assay is intended as an aid in the diagnosis of influenza from Nasopharyngeal swab specimens and should not be used as a sole basis for treatment. Nasal washings and aspirates are unacceptable for Xpert Xpress SARS-CoV-2/FLU/RSV testing.  Fact Sheet for Patients: EntrepreneurPulse.com.au  Fact Sheet for Healthcare Providers: IncredibleEmployment.be  This test is not yet approved or cleared by the [place] FDA and has been authorized for detection and/or diagnosis of SARS-CoV-2 by FDA under an Emergency Use Authorization (EUA). This EUA will remain in effect (meaning this test can be used) for the duration of the COVID-19 declaration under Section 564(b)(1) of the Act, 21 U.S.C. section 360bbb-3(b)(1), unless the authorization is terminated or revoked.  Performed at [facility], [place] [address], [place], [place] [address]   Respiratory (~20 pathogens) panel by PCR     Status: Abnormal   Collection Time: 01/06/21  8:18 PM   Specimen: Nasopharyngeal Swab; Respiratory  Result Value Ref Range Status   Adenovirus NOT DETECTED NOT DETECTED Final   Coronavirus 229E NOT DETECTED NOT DETECTED Final    Comment: (NOTE) The Coronavirus on the Respiratory Panel, DOES NOT test for the novel  Coronavirus (2019 nCoV)    Coronavirus HKU1 NOT DETECTED NOT DETECTED Final   Coronavirus NL63 NOT DETECTED NOT DETECTED Final   Coronavirus OC43 NOT DETECTED NOT DETECTED Final   Metapneumovirus NOT DETECTED NOT DETECTED Final   Rhinovirus / Enterovirus DETECTED (A) NOT DETECTED Final   Influenza A NOT DETECTED NOT DETECTED Final   Influenza B NOT DETECTED NOT DETECTED Final   Parainfluenza Virus 1 NOT DETECTED NOT DETECTED Final   Parainfluenza Virus 2 NOT DETECTED NOT DETECTED Final   Parainfluenza Virus 3 NOT DETECTED NOT DETECTED Final    Parainfluenza Virus 4 NOT DETECTED NOT DETECTED Final   Respiratory Syncytial Virus NOT DETECTED NOT DETECTED Final   Bordetella pertussis NOT DETECTED NOT DETECTED Final   Bordetella Parapertussis NOT DETECTED NOT DETECTED Final   Chlamydophila pneumoniae NOT DETECTED NOT  DETECTED Final   Mycoplasma pneumoniae NOT DETECTED NOT DETECTED Final    Comment: Performed at [facility], [place] [address] [place], [place] [address]       Radiology Studies: No results found.  Scheduled Meds:  acetaminophen  650 mg Oral Q8H   amiodarone  200 mg Oral Daily   apixaban  2.5 mg Oral BID   atorvastatin  20 mg Oral q1800   bacitracin  1 application Topical BID   darifenacin  15 mg Oral Daily   furosemide  20 mg Intravenous Daily   furosemide  20 mg Oral QODAY   guaiFENesin  600 mg Oral BID   isosorbide mononitrate  60 mg Oral Daily   levothyroxine  62.5 mcg Oral QAC breakfast   predniSONE  40 mg Oral Q breakfast   saccharomyces boulardii  250 mg Oral BID   sodium chloride flush  3 mL Intravenous Q12H   vitamin B-12  1,000 mcg Oral Daily   Continuous Infusions:  cefTRIAXone (ROCEPHIN)  IV 2 g (01/08/21 1250)   doxycycline (VIBRAMYCIN) IV 100 mg (01/08/21 0958)     LOS: 2 days   Time spent: 30 minutes   Darliss Cheney, MD [facility]  01/08/2021, 12:52 PM  Please page via [facility] and do not message via secure chat for anything urgent. Secure chat can be used for anything non urgent.  How to contact the [facility] Attending or Consulting provider [place] or covering provider during after hours [place], for this patient?  Check the care team in [facility] and look for a) attending/consulting [facility] provider listed and b) the [facility] team listed. Page or secure chat [phone]. Log into [email or web address] and use [place]'s universal password to access. If you do not have the password, please contact the hospital operator. Locate the [facility] provider you are looking for under [facility] and page to a  number that you can be directly reached. If you still have difficulty reaching the provider, please page the [facility] (Director on Call) for the Hospitalists listed on amion for assistance.

## 2021-01-08 NOTE — Progress Notes (Signed)
SATURATION QUALIFICATIONS: (This note is used to comply with regulatory documentation for home oxygen)  Patient Saturations on Room Air at Rest = 86%  Patient Saturations on Room Air while Ambulating = 83%  Patient Saturations on 3 Liters of oxygen while Ambulating = 92%  Please briefly explain why patient needs home oxygen: Pt's sats decrease to 80s% without supplemental O2.   Moishe Spice, PT, DPT Acute Rehabilitation Services  Pager: [phone] Office: [phone]

## 2021-01-08 NOTE — Evaluation (Signed)
Occupational Therapy Evaluation Patient Details Name: Cathy [name] MRN: [id number] DOB: November 16, 1927 Today's Date: 01/08/2021   History of Present Illness Pt is a 87 y.o. female who presented 01/05/21 with SOB and cough and recent falls, one of which she hit her head. CT of head negative for acute intracranial abnormalities and all other images negative for acute fxs. Admitted with suspected community-acquired pneumonia. Chest x-ray noted mild left pleural effusion with associated left basilar atelectasis or infiltrate. PMH: mild dementia, HTN, HLD, HFpEF, paroxysmal atrial flutter, hiatal hernia, and osteoarthritis   Clinical Impression   Patient admitted for the diagnosis above.  PTA she lives alone with daily check-ins from family.  Patient needs assist with medication setup and bill payment.  Generally she uses a RW for in home mobility, and typically takes sponge baths from a sit/stand level.  Deficits impacting independence are listed below.  Currently she is needing up to Min A for basic mobility at RW level, and up to Mod A for lower body ADL.  Given the patient's current functional status, and insufficient assist at home, currently needing 24 hour physical assist, OT recommends a short SNF stay for post acute rehab prior to home.  OT will follow int he acute setting.        Recommendations for follow up therapy are one component of a multi-disciplinary discharge planning process, led by the attending physician.  Recommendations may be updated based on patient status, additional functional criteria and insurance authorization.   Follow Up Recommendations  Skilled nursing-short term rehab (<3 hours/day)    Assistance Recommended at Discharge Frequent or constant Supervision/Assistance  Functional Status Assessment  Patient has had a recent decline in their functional status and demonstrates the ability to make significant improvements in function in a reasonable and predictable amount of  time.  Equipment Recommendations  None recommended by OT    Recommendations for Other Services       Precautions / Restrictions Precautions Precautions: None Precaution Comments: monitor SpO2; HOH Restrictions Weight Bearing Restrictions: No      Mobility Bed Mobility Overal bed mobility: Needs Assistance Bed Mobility: Sit to Supine     Supine to sit: Min guard;HOB elevated Sit to supine: Min assist   General bed mobility comments: Min guard for safety and extra time to complete with HOB elevated.    Transfers Overall transfer level: Needs assistance Equipment used: Rolling walker (2 wheels) Transfers: Sit to/from Stand Sit to Stand: Min assist           General transfer comment:      Balance Overall balance assessment: Needs assistance Sitting-balance support: Feet supported Sitting balance-Leahy Scale: Good     Standing balance support: Bilateral upper extremity supported;Reliant on assistive device for balance                               ADL either performed or assessed with clinical judgement   ADL Overall ADL's : Needs assistance/impaired Eating/Feeding: Set up;Sitting   Grooming: Wash/dry hands;Wash/dry face;Standing;Minimal assistance   Upper Body Bathing: Supervision/ safety;Sitting   Lower Body Bathing: Moderate assistance;Sit to/from stand   Upper Body Dressing : Minimal assistance;Sitting   Lower Body Dressing: Moderate assistance;Sit to/from stand   Toilet Transfer: Minimal assistance;Regular Toilet;Rolling walker (2 wheels)   Toileting- Clothing Manipulation and Hygiene: Supervision/safety;Sitting/lateral lean               Vision Baseline Vision/History: 1 Wears glasses  Patient Visual Report: No change from baseline       Perception Perception Perception: Not tested   Praxis Praxis Praxis: Not tested    Pertinent Vitals/Pain Pain Assessment: No/denies pain Faces Pain Scale: No hurt Pain  Intervention(s): Monitored during session     Hand Dominance Right   Extremity/Trunk Assessment Upper Extremity Assessment Upper Extremity Assessment: Generalized weakness   Lower Extremity Assessment Lower Extremity Assessment: Defer to PT evaluation   Cervical / Trunk Assessment Cervical / Trunk Assessment: Kyphotic   Communication Communication Communication: HOH   Cognition Arousal/Alertness: Awake/alert Behavior During Therapy: WFL for tasks assessed/performed Overall Cognitive Status: History of cognitive impairments - at baseline                                 General Comments: Sequencing cues as needed for mobiltiy and ADL     General Comments  SpO2 as low as 86% on RA at rest, 83% on RA when ambulating, 92% on 3L when ambulating; informed pt and daughter-in-law of PACE program and daughter-in-law of caregiver support groups    Exercises     Shoulder Instructions      Home Living Family/patient expects to be discharged to:: Private residence Living Arrangements: Alone Available Help at Discharge: Family;Available PRN/intermittently Type of Home: House Home Access: Stairs to enter [employer] of Steps: 4-5 Entrance Stairs-Rails: Can reach both Home Layout: One level     Bathroom Shower/Tub: Tub/shower unit;Sponge bathes at baseline   [employer]: Handicapped height Bathroom Accessibility: Yes How Accessible: Accessible via walker Home Equipment: [place] (2 wheels);Tub bench;Hand held shower head;Rollator (4 wheels)   Additional Comments: Per PT eval:  Daughter-in-law (who works) is only family member who can provide assistance long term as pt's son(DIL husband) is progressively declining with vascular dementia and pt's daughter is beginning to have more difficulty also with vascular dementia. Someone checks on pt almost daily at baseline.      Prior Functioning/Environment Prior Level of Function : Needs assist   Cognitive Assist : ADLs (cognitive)   ADLs (Cognitive): Set up cues (family sets up pill box for pt) Physical Assist : ADLs (physical)   ADLs (physical): IADLs Mobility Comments: Mod I with RW. ADLs Comments: Family drives pt and manages meds and finances.  She would need assist to transfer into the bathtub        OT Problem List: Decreased strength;Decreased activity tolerance;Impaired balance (sitting and/or standing)      OT Treatment/Interventions: Self-care/ADL training;Therapeutic exercise;Therapeutic activities;Patient/family education;DME and/or AE instruction;Balance training    OT Goals(Current goals can be found in the care plan section) Acute Rehab OT Goals Patient Stated Goal: I just need to get a little better OT Goal Formulation: With patient Time For Goal Achievement: 01/22/21 Potential to Achieve Goals: Good ADL Goals Pt Will Perform Grooming: with set-up;standing Pt Will Perform Lower Body Bathing: with supervision;sit to/from stand Pt Will Perform Lower Body Dressing: with supervision;sit to/from stand Pt Will Transfer to Toilet: with set-up;ambulating;regular height toilet Pt Will Perform Toileting - Clothing Manipulation and hygiene: with supervision;sit to/from stand Pt/caregiver will Perform Home Exercise Program: Increased strength;Both right and left upper extremity;With minimal assist;With written HEP provided  OT Frequency: Min 2X/week   Barriers to D/C: Decreased caregiver support          Co-evaluation              AM-PAC OT "6 Clicks" Daily  Activity     Outcome Measure Help from another person eating meals?: None Help from another person taking care of personal grooming?: A Little Help from another person toileting, which includes using toliet, bedpan, or urinal?: A Little Help from another person bathing (including washing, rinsing, drying)?: A Lot Help from another person to put on and taking off regular upper body clothing?: A  Little Help from another person to put on and taking off regular lower body clothing?: A Lot 6 Click Score: 17   End of Session Equipment Utilized During Treatment: Rolling walker (2 wheels);Oxygen Nurse Communication: Mobility status  Activity Tolerance: Patient tolerated treatment well Patient left: in bed;with call bell/phone within reach  OT Visit Diagnosis: Unsteadiness on feet (R26.81);Muscle weakness (generalized) (M62.81)                Time: 3374-4514 OT Time Calculation (min): 25 min Charges:  OT General Charges $OT Visit: 1 Visit OT Evaluation $OT Eval Moderate Complexity: 1 Mod OT Treatments $Self Care/Home Management : 8-22 mins  01/08/2021  RP, OTR/L  Acute Rehabilitation Services  Office:  [phone]   Metta Clines 01/08/2021, 2:54 PM

## 2021-01-08 NOTE — Evaluation (Signed)
Physical Therapy Evaluation Patient Details Name: Cathy [name] MRN: [id number] DOB: 09-30-1927 Today's Date: 01/08/2021  History of Present Illness  Pt is a 87 y.o. female who presented 01/05/21 with SOB and cough and recent falls, one of which she hit her head. CT of head negative for acute intracranial abnormalities and all other images negative for acute fxs. Admitted with suspected community-acquired pneumonia. Chest x-ray noted mild left pleural effusion with associated left basilar atelectasis or infiltrate. PMH: mild dementia, HTN, HLD, HFpEF, paroxysmal atrial flutter, hiatal hernia, and osteoarthritis   Clinical Impression  Pt presents with condition above and deficits mentioned below, see PT Problem List. PTA, she was living alone in a 1-level house with 4-5 STE with handrails, functioning mod I for household mobility with a RW, intermittently using a broom as a cane. Pt displays deficits in strength, cardiovascular endurance, cognition, and balance. Pt currently, is requiring up to [place] for transfers and to ambulate bedroom distances with frequent cues using a RW. Pt has had several recent falls and displayed LOB bouts during her session this date. She is at high risk for subsequent falls. If pt were to go home, she would require near 24/7 assistance/supervision due to her high risk for falls and cognitive deficits. Currently, pt only has PRN assistance available as her daughter-in-law (who works) is only family member who can provide assistance long term as pt's son is progressively declining with vascular dementia and will need more assistance from the daughter-in-law and pt's daughter is beginning to have more difficulty also with vascular dementia. Discussed considering long-term memory care facility or PACE program with pt and daughter-in-law. Pt would greatly benefit from a short-term rehab stay at a SNF to maximize her safety and independence with all functional mobility prior to returning  home. Pt would benefit from possibly pursuing the PACE program if applicable as she expressed desire to stay in her home as long as possible. Will continue to follow acutely.     Recommendations for follow up therapy are one component of a multi-disciplinary discharge planning process, led by the attending physician.  Recommendations may be updated based on patient status, additional functional criteria and insurance authorization.  Follow Up Recommendations Skilled nursing-short term rehab (<3 hours/day)    Assistance Recommended at Discharge Frequent or constant Supervision/Assistance  Functional Status Assessment Patient has had a recent decline in their functional status and demonstrates the ability to make significant improvements in function in a reasonable and predictable amount of time.  Equipment Recommendations  None recommended by PT    Recommendations for Other Services       Precautions / Restrictions Precautions Precautions: Fall;Other (comment) Precaution Comments: monitor SpO2; HOH Restrictions Weight Bearing Restrictions: No      Mobility  Bed Mobility Overal bed mobility: Needs Assistance Bed Mobility: Supine to Sit     Supine to sit: Min guard;HOB elevated     General bed mobility comments: Min guard for safety and extra time to complete with HOB elevated.    Transfers Overall transfer level: Needs assistance Equipment used: Rolling walker (2 wheels) Transfers: Sit to/from Stand Sit to Stand: Mod assist;Min guard           General transfer comment: Sit to stand with heavy reliance on posterior aspect of lower legs to support self on EOB to come to stand from EOB, min guard assist, unsteadiness noted. However, modA to power up to stand from standard commode.    Ambulation/Gait Ambulation/Gait assistance: Min assist;Mod assist  Gait Distance (Feet): 30 Feet (x2 bouts of ~20 ft > ~30 ft) Assistive device: Rolling walker (2 wheels) Gait  Pattern/deviations: Step-through pattern;Decreased stride length;Shuffle;Trunk flexed;Knee flexed in stance - right;Knee flexed in stance - left;Wide base of support Gait velocity: reduced Gait velocity interpretation: <1.31 ft/sec, indicative of household ambulator   General Gait Details: Pt with short, shuffling steps with kyphotic posture and bil knees flexed throughout. Pt with intermittent trunk sway and LOB needing up to modA to recover. MinA otherwise to stabilize with mod cues to direct pt with RW for mobility in room and around obstacles.  Stairs            Wheelchair Mobility    Modified Rankin (Stroke Patients Only) Modified Rankin (Stroke Patients Only) Pre-Morbid Rankin Score: Moderate disability Modified Rankin: Moderately severe disability     Balance                                             Pertinent Vitals/Pain Pain Assessment: Faces Faces Pain Scale: No hurt Pain Intervention(s): Monitored during session    Home Living Family/patient expects to be discharged to:: Private residence Living Arrangements: Alone Available Help at Discharge: Family;Available PRN/intermittently Type of Home: House Home Access: Stairs to enter Entrance Stairs-Rails: Can reach both Entrance Stairs-Number of Steps: 4-5   Home Layout: One level Home Equipment: Conservation [employer], nature (2 wheels);Tub bench;Hand held shower head Additional Comments: Daughter-in-law (who works) is only family member who can provide assistance long term as pt's son is progressively declining with vascular dementia and pt's daughter is beginning to have more difficulty also with vascular dementia. Someone checks on pt almost daily at baseline.    Prior Function Prior Level of Function : Needs assist  Cognitive Assist : ADLs (cognitive)   ADLs (Cognitive): Set up cues (family sets up pill box for pt) Physical Assist : ADLs (physical)   ADLs (physical): Bathing;IADLs Mobility Comments:  Mod I with RW. Sometimes uses a broom as a cane when she does not want to make lines on her carpet with her RW. Hx of recent falls. ADLs Comments: Family drives pt and manages meds and finances.     Hand Dominance        Extremity/Trunk Assessment   Upper Extremity Assessment Upper Extremity Assessment: Defer to OT evaluation    Lower Extremity Assessment Lower Extremity Assessment: Generalized weakness (MMT scores of 4 to 4+ grossly)    Cervical / Trunk Assessment Cervical / Trunk Assessment: Kyphotic  Communication   Communication: HOH  Cognition Arousal/Alertness: Awake/alert Behavior During Therapy: Flat affect Overall Cognitive Status: History of cognitive impairments - at baseline                                 General Comments: Pt with mild dementia at baseline. Pt needs cues for sequencing and to direct her when ambulating majority of time.        General Comments General comments (skin integrity, edema, etc.): SpO2 as low as 86% on RA at rest, 83% on RA when ambulating, 92% on 3L when ambulating; informed pt and daughter-in-law of PACE program and daughter-in-law of caregiver support groups    Exercises     Assessment/Plan    PT Assessment Patient needs continued PT services  PT Problem List Decreased strength;Decreased activity tolerance;Decreased  balance;Decreased range of motion;Decreased mobility;Decreased cognition;Decreased safety awareness;Cardiopulmonary status limiting activity       PT Treatment Interventions DME instruction;Gait training;Functional mobility training;Therapeutic exercise;Therapeutic activities;Balance training;Neuromuscular re-education;Patient/family education;Cognitive remediation    PT Goals (Current goals can be found in the Care Plan section)  Acute Rehab PT Goals Patient Stated Goal: to return home long-term PT Goal Formulation: With patient/family Time For Goal Achievement: 01/22/21 Potential to Achieve  Goals: Good    Frequency Min 2X/week   Barriers to discharge Decreased caregiver support Daughter-in-law (who works) is only family member who can provide assistance long term as pt's son is progressively declining with vascular dementia and pt's daughter is beginning to have more difficulty also with vascular dementia.    Co-evaluation               AM-PAC PT "6 Clicks" Mobility  Outcome Measure Help needed turning from your back to your side while in a flat bed without using bedrails?: A Little Help needed moving from lying on your back to sitting on the side of a flat bed without using bedrails?: A Little Help needed moving to and from a bed to a chair (including a wheelchair)?: A Lot Help needed standing up from a chair using your arms (e.g., wheelchair or bedside chair)?: A Lot Help needed to walk in hospital room?: A Lot Help needed climbing 3-5 steps with a railing? : Total 6 Click Score: 13    End of Session Equipment Utilized During Treatment: Gait belt;Oxygen Activity Tolerance: Patient tolerated treatment well Patient left: in chair;with call bell/phone within reach;with chair alarm set Nurse Communication: Mobility status PT Visit Diagnosis: Unsteadiness on feet (R26.81);Other abnormalities of gait and mobility (R26.89);Muscle weakness (generalized) (M62.81);History of falling (Z91.81);Repeated falls (R29.6);Difficulty in walking, not elsewhere classified (R26.2)    Time: 2863-8177 PT Time Calculation (min) (ACUTE ONLY): 42 min   Charges:   PT Evaluation $PT Eval Moderate Complexity: 1 Mod PT Treatments $Gait Training: 8-22 mins $Therapeutic Activity: 8-22 mins        Moishe Spice, PT, DPT Acute Rehabilitation Services  Pager: [phone] Office: [phone]   Orvan Falconer 01/08/2021, 1:48 PM

## 2021-01-09 ENCOUNTER — Encounter: Payer: Self-pay | Admitting: Dermatology

## 2021-01-09 ENCOUNTER — Inpatient Hospital Stay (HOSPITAL_COMMUNITY): Payer: Medicare Other

## 2021-01-09 DIAGNOSIS — J189 Pneumonia, unspecified organism: Secondary | ICD-10-CM | POA: Diagnosis not present

## 2021-01-09 DIAGNOSIS — E440 Moderate protein-calorie malnutrition: Secondary | ICD-10-CM | POA: Insufficient documentation

## 2021-01-09 LAB — CBC WITH DIFFERENTIAL/PLATELET
Abs Immature Granulocytes: 0.12 10*3/uL — ABNORMAL HIGH (ref 0.00–0.07)
Basophils Absolute: 0 10*3/uL (ref 0.0–0.1)
Basophils Relative: 0 %
Eosinophils Absolute: 0 10*3/uL (ref 0.0–0.5)
Eosinophils Relative: 0 %
HCT: 31.4 % — ABNORMAL LOW (ref 36.0–46.0)
Hemoglobin: 10.6 g/dL — ABNORMAL LOW (ref 12.0–15.0)
Immature Granulocytes: 1 %
Lymphocytes Relative: 5 %
Lymphs Abs: 0.7 10*3/uL (ref 0.7–4.0)
MCH: 30.6 pg (ref 26.0–34.0)
MCHC: 33.8 g/dL (ref 30.0–36.0)
MCV: 90.8 fL (ref 80.0–100.0)
Monocytes Absolute: 1.4 10*3/uL — ABNORMAL HIGH (ref 0.1–1.0)
Monocytes Relative: 9 %
Neutro Abs: 13.2 10*3/uL — ABNORMAL HIGH (ref 1.7–7.7)
Neutrophils Relative %: 85 %
Platelets: 210 10*3/uL (ref 150–400)
RBC: 3.46 MIL/uL — ABNORMAL LOW (ref 3.87–5.11)
RDW: 15.1 % (ref 11.5–15.5)
WBC: 15.4 10*3/uL — ABNORMAL HIGH (ref 4.0–10.5)
nRBC: 0 % (ref 0.0–0.2)

## 2021-01-09 LAB — BASIC METABOLIC PANEL
Anion gap: 6 (ref 5–15)
BUN: 21 mg/dL (ref 8–23)
CO2: 28 mmol/L (ref 22–32)
Calcium: 8.3 mg/dL — ABNORMAL LOW (ref 8.9–10.3)
Chloride: 98 mmol/L (ref 98–111)
Creatinine, Ser: 0.96 mg/dL (ref 0.44–1.00)
GFR, Estimated: 56 mL/min — ABNORMAL LOW (ref >60)
Glucose, Bld: 90 mg/dL (ref 70–99)
Potassium: 3.4 mmol/L — ABNORMAL LOW (ref 3.5–5.1)
Sodium: 132 mmol/L — ABNORMAL LOW (ref 135–145)

## 2021-01-09 LAB — RESP PANEL BY RT-PCR (FLU A&B, COVID) ARPGX2
Influenza A by PCR: NEGATIVE
Influenza B by PCR: NEGATIVE
SARS Coronavirus 2 by RT PCR: NEGATIVE

## 2021-01-09 LAB — URINE CULTURE: Culture: NO GROWTH

## 2021-01-09 MED ORDER — POTASSIUM CHLORIDE CRYS ER 20 MEQ PO TBCR
40.0000 meq | EXTENDED_RELEASE_TABLET | Freq: Once | ORAL | Status: AC
  Administered 2021-01-09: 40 meq via ORAL
  Filled 2021-01-09: qty 2

## 2021-01-09 MED ORDER — ENSURE ENLIVE PO LIQD
237.0000 mL | Freq: Two times a day (BID) | ORAL | Status: DC
  Administered 2021-01-09 – 2021-01-12 (×4): 237 mL via ORAL

## 2021-01-09 MED ORDER — ADULT MULTIVITAMIN W/MINERALS CH
1.0000 | ORAL_TABLET | Freq: Every day | ORAL | Status: DC
  Administered 2021-01-09 – 2021-01-12 (×4): 1 via ORAL
  Filled 2021-01-09 (×4): qty 1

## 2021-01-09 NOTE — TOC Initial Note (Addendum)
Transition of Care [facility]) - Initial/Assessment Note    Patient Details  Name: Cathy [name] MRN: [id number] Date of Birth: 04-10-1927  Transition of [facility]) CM/SW Contact:    Tresa Endo Phone Number: 01/09/2021, 10:24 AM  Clinical Narrative:                 CSW received SNF consult. CSW met with pt daughter in law, CSW introduced self and explained role at the hospital. Pt reports that PTA the pt was from home alone and states she would like to return. PT reports functional ADLS, pt used a 2 wheeled walker while working with PT. PT states functioning mod I for household mobility with a RW, intermittently using a broom as a cane.  CSW reviewed PT/OT recommendations for SNF. Pt reports being agreeable to SNF. Pt gave CSW permission to fax out to facilities in the area. Pt preferred [facility] and [place] contacted [facility] and they have no bed availability, CSW then contacted [facility] and had to leave a VM with admissions. CSW gave pt medicare.gov rating list to review along with long term facilities. PT is fully vaccinated.   CSW will continue to follow  Expected Discharge Plan: [place] Barriers to Discharge: Continued Medical Work up   Patient Goals and CMS Choice Patient states their goals for this hospitalization and ongoing recovery are:: Rehab CMS Medicare.gov Compare Post Acute Care list provided to:: Patient Choice offered to / list presented to : Patient  Expected Discharge Plan and Services Expected Discharge Plan: [place] In-house Referral: Clinical Social Work Discharge Planning Services: CM Consult Post Acute Care Choice: [place] arrangements for the past 2 months: [place]                                      Prior Living Arrangements/Services Living arrangements for the past 2 months: Single Family Home Lives with:: Self Patient language and need for  interpreter reviewed:: Yes Do you feel safe going back to the place where you live?: Yes      Need for Family Participation in Patient Care: Yes (Comment) Care giver support system in place?: Yes (comment)   Criminal Activity/Legal Involvement Pertinent to Current Situation/Hospitalization: Yes - Comment as needed  Activities of Daily Living   ADL Screening (condition at time of admission) Is the patient deaf or have difficulty hearing?: Yes Does the patient have difficulty seeing, even when wearing glasses/contacts?: Yes Does the patient have difficulty concentrating, remembering, or making decisions?: No Patient able to express need for assistance with ADLs?: Yes Does the patient have difficulty dressing or bathing?: Yes Does the patient have difficulty walking or climbing stairs?: Yes  Permission Sought/Granted Permission sought to share information with : Family Supports, Chartered certified accountant granted to share information with : Yes, Verbal Permission Granted  Share Information with NAME: Cathy, [name] Relative     [phone]  Permission granted to share info w AGENCY: SNF  Permission granted to share info w Relationship: Cathy, Whiters Relative     [phone]  Permission granted to share info w Contact Information: Cathy, [name] Relative     [phone]  Emotional Assessment Appearance:: Appears stated age Attitude/Demeanor/Rapport: Engaged Affect (typically observed): Appropriate Orientation: : Oriented to Self, Oriented to Place, Oriented to  Time Alcohol / Substance Use: Not Applicable Psych Involvement: No (comment)  Admission  diagnosis:  Acute respiratory distress [R06.03] Hypoxia [R09.02] Community acquired pneumonia of left lower lobe of lung [J18.9] Patient Active Problem List   Diagnosis Date Noted   Acute respiratory failure with hypoxia ([place]) 01/07/2021   Severe sepsis ([place]) 01/07/2021   Acute respiratory distress 01/06/2021    Community acquired pneumonia 01/06/2021   Heart failure with preserved ejection fraction ([place]) 01/06/2021   Hyponatremia 01/06/2021   SIRS (systemic inflammatory response syndrome) ([place]) 01/06/2021   Primary osteoarthritis of both hands 07/06/2019   Hip fracture ([place]) 06/29/2019   Closed fracture of neck of left femur ([place]) 06/28/2019   GERD without esophagitis 06/28/2019   Noninfected skin tear of leg, right, initial encounter 04/19/2018   Bowel obstruction ([place]) 02/21/2018   Hypothyroidism 02/21/2018   Osteoarthritis of left shoulder 08/10/2017   Pain in joint of left shoulder 08/10/2017   Mass of joint of left wrist 06/22/2017   Arthritis of left wrist 06/21/2017   Carpal tunnel syndrome 06/21/2017   Tenosynovitis of left wrist 06/21/2017   Fracture of distal end of humerus 03/25/2017   Closed supracondylar fracture of left humerus 01/15/2017   Gait disorder 01/15/2017   Recurrent falls 01/15/2017   Rib fractures 11/28/2016   Skin lesion 09/30/2016   Ganglion cyst of dorsum of left wrist 09/30/2016   Rotator cuff arthropathy, right 04/26/2016   Hyperglycemia 04/01/2016   Right arm pain 04/01/2016   Wrist swelling 09/30/2015   Preventative health care 04/02/2015   Chronic combined systolic and diastolic CHF (congestive heart failure) ([place]) 03/17/2015   Coronary artery calcification seen on CT scan 03/17/2015   Sinus bradycardia 03/17/2015   Splenic lesion 02/18/2015   Liver lesion 02/18/2015   Coronary artery disease involving native coronary artery of native heart without angina pectoris 02/03/2015   Atrial fibrillation and flutter ([place]) 02/02/2015   Fatigue 01/31/2015   Abnormal CT of the abdomen 01/31/2015   Prolonged Q-T interval on ECG 01/31/2015   Hepatic lesion    Chronic constipation 12/03/2014   Lower extremity edema 04/25/2014   Peripheral neuropathy ([place]) 12/10/2013   Overactive bladder 12/27/2011   Senile osteoporosis 12/27/2011   Vitamin B12 deficiency  05/03/2010   Iron deficiency anemia 05/03/2010   Normocytic anemia 05/03/2010   VENOUS INSUFFICIENCY 01/27/2009   BACK PAIN, LUMBAR 01/27/2008   URINARY INCONTINENCE 08/23/2007   DIVERTICULOSIS OF COLON 04/10/2007   HYPERCHOLESTEROLEMIA 02/13/2007   ANXIETY 02/13/2007   Essential hypertension 02/13/2007   Diaphragmatic hernia 02/13/2007   Irritable bowel syndrome 02/13/2007   DEGENERATIVE JOINT DISEASE 02/13/2007   Allergic rhinitis 02/13/2007   PCP:  Biagio Borg, MD Pharmacy:   [facility] [place] [facility] [address] [place] [address] Phone: [phone] Fax: [phone]  [id number] [id number]: [phone]: [phone] [id number] [address]: [phone]: [phone]   Social Determinants of Health (SDOH) Interventions    Readmission Risk Interventions No flowsheet data found.

## 2021-01-09 NOTE — Progress Notes (Signed)
Initial Nutrition Assessment  DOCUMENTATION CODES:  Non-severe (moderate) malnutrition in context of chronic illness  INTERVENTION:  Add Ensure Plus High Protein po BID, each supplement provides 350 kcal and 20 grams of protein.   Add MVI with minerals daily.  Encourage PO and supplement intake.  NUTRITION DIAGNOSIS:  Moderate Malnutrition related to chronic illness (PAF) as evidenced by moderate fat depletion, moderate muscle depletion, severe muscle depletion.  GOAL:  Patient will meet greater than or equal to 90% of their needs  MONITOR:  PO intake, Supplement acceptance, Labs, Weight trends, I & O's  REASON FOR ASSESSMENT:  Consult Assessment of nutrition requirement/status  ASSESSMENT:  87 yo female with a PMH of HTN, HLD, HFpEF, paroxysmal atrial flutter, hiatal hernia, and osteoarthritis who presents with complaints of cough and shortness of breath. Admitted with CAP.  Per Epic, pt ate 0% of lunch and dinner yesterday. On 11/9, pt ate 50% of lunch and 30% of dinner.   Spoke with pt at bedside. Pt very tired and reports that she does not need so much food every day. Agreeable to drinking 1-2 Ensure per day to supplement intake.  Pt requested RD to come back later, so she could rest.  Per Epic, pt's weight has remained stable over the past two years.  Recommend adding Ensure BID and MVI with minerals daily.  Medications: reviewed; Lasix every other day, Vitamin B12  Labs: reviewed; Na 132 (L), K 3.4 (L)  NUTRITION - FOCUSED PHYSICAL EXAM: Flowsheet Row Most Recent Value  Orbital Region Moderate depletion  Upper Arm Region Severe depletion  Thoracic and Lumbar Region No depletion  Buccal Region Moderate depletion  Temple Region Moderate depletion  Clavicle Bone Region Severe depletion  Clavicle and Acromion Bone Region Severe depletion  Scapular Bone Region Moderate depletion  Dorsal Hand Severe depletion  Patellar Region Moderate depletion  Anterior Thigh  Region Moderate depletion  Posterior Calf Region Moderate depletion  Edema (RD Assessment) None  Hair Reviewed  Eyes Reviewed  Mouth Reviewed  Skin Reviewed  Nails Reviewed   Diet Order:   Diet Order             Diet Heart Room service appropriate? Yes; Fluid consistency: Thin  Diet effective now                  EDUCATION NEEDS:  Education needs have been addressed  Skin:  Skin Assessment: Reviewed RN Assessment  Last BM:  no BM documented  Height:  Ht Readings from Last 1 Encounters:  01/06/21 4\' 7"  (1.397 m)   Weight:  Wt Readings from Last 1 Encounters:  01/09/21 52.1 kg   BMI:  Body mass index is 26.7 kg/m.  Estimated Nutritional Needs:  Kcal:  1450-1650 Protein:  65-80 grams Fluid:  >1.5 L  Derrel Nip, RD, LDN (she/her/hers) Clinical Inpatient Dietitian RD Pager/After-Hours/Weekend Pager # in [place]

## 2021-01-09 NOTE — Progress Notes (Addendum)
PROGRESS NOTE    Cathy [name]  WFU:[id number] DOB: 1927-09-27 DOA: 01/06/2021 PCP: Biagio Borg, MD   Brief Narrative:  Cathy [name] is a 87 y.o. female with medical history significant of HTN, HLD, HFpEF, paroxysmal atrial flutter, hiatal hernia, and osteoarthritis who presented with complaints of cough and shortness of breath for approximately 2 days associated with diffuse body aches. Noted also having sore throat, fever, chills, congestion, intermittently productive cough, mild substernal chest discomfort, and dysuria.  She had fallen and hit her head.   Upon admission to the emergency department patient was seen to be afebrile, suppressions 18-23, blood pressure 93/47-126/66, and O2 saturations noted to be as low as 90% on room air placed on 2-3 L nasal cannula oxygen.  Chest x-ray noted large hiatal hernia with mild left pleural effusion with associated atelectasis versus infiltrate.  CT scan of the head noted no acute cranial abnormality and improvement in right parietal scalp hematoma.  Patient was given Rocephin, Zithromax, DuoNeb and some fluids and was admitted to hospital service.  Assessment & Plan:   Principal Problem:   Community acquired pneumonia Active Problems:   Diaphragmatic hernia   Atrial fibrillation and flutter (HCC)   Coronary artery disease involving native coronary artery of native heart without angina pectoris   Heart failure with preserved ejection fraction (HCC)   Hyponatremia   Acute respiratory failure with hypoxia (HCC)   Severe sepsis (HCC)   Malnutrition of moderate degree  Acute hypoxic respiratory failure/severe sepsis secondary to suspected community-acquired pneumonia/rhinovirus infection, POA: Patient met severe sepsis criteria upon admission based on tachypnea, leukocytosis, acute hypoxic respiratory failure and pneumonia.  Reported to have close contact with family members with influenza.  Influenza and COVID-19 screening were negative but she  was positive for rhinovirus. Chest x-ray noted mild left pleural effusion with associated left basilar atelectasis or infiltrate.  She received Rocephin and azithromycin.  Urine antigen for streptococci and legionella negative.  Afebrile.  Leukocytosis improving.  Patient clinically improving and now on 2 to 3 L of oxygen.  We will repeat chest x-ray today as she seems to have some crackles.  Procalcitonin 0.27 we will continue Rocephin and doxycycline.  Encouraged incentive spirometry.   Heart failure with preserved EF: Based on my chart review and personally examining the patient, I do not think that patient had acute component of congestive heart failure.  She has chronic diastolic congestive heart failure and she appears euvolemic.  Her BNP although elevated but is very close to her baseline and chest x-ray did not reveal any Signs of pulmonary edema.  Continue home dose of Lasix.  Chest x-ray pending.  If signs of pulm edema, might give her some IV Lasix.   Permanent atrial fibrillation: Rates controlled. Continue amiodarone and Eliquis   History of CAD/chest pain/elevated troponin: Patient did report having some mild substernal chest discomfort.  High-sensitivity troponin 23 with repeat 27.  Thought to be relatively flat which suspect possibly related to demand.  CKD stage IIIa: Stable   Normocytic anemia: Stable.   Hyponatremia: Labs indicate possible SIADH in the face of pneumonia.  However sodium improved to 132 today.  We will just monitor.  Hypokalemia: 3.4.  Will replace.   Fall at home: Seen by PT OT, they recommend SNF.  Per TOC, SNF is arranged for her to be discharged tomorrow.  Chronic thrombocytopenia: Stable.  No signs of bleeding.   Hypothyroidism: Continue Synthroid.  DVT prophylaxis: apixaban (ELIQUIS) tablet 2.5 mg  Start: 01/06/21 2200 SCDs Start: 01/06/21 1312   Code Status: Full Code  Family Communication: None at bedside.  Status is: Inpatient  Remains inpatient  appropriate because: Needs management of her acute issues.  Estimated body mass index is 26.7 kg/m as calculated from the following:   Height as of this encounter: _0  (1.397 m).   Weight as of this encounter: 52.1 kg.     Nutritional Assessment: Body mass index is 26.7 kg/m.[name] Seen by dietician.  I agree with the assessment and plan as outlined below: Nutrition Status:  Skin Assessment: I have examined the patient's skin and I agree with the wound assessment as performed by the wound care RN as outlined below:    Consultants:  None  Procedures:  None  Antimicrobials:  Anti-infectives (From admission, onward)    Start     Dose/Rate Route Frequency Ordered Stop   01/07/21 1000  doxycycline (VIBRAMYCIN) 100 mg in sodium chloride 0.9 % 250 mL IVPB        100 mg 125 mL/hr over 120 Minutes Intravenous Every 12 hours 01/06/21 1316     01/06/21 1215  cefTRIAXone (ROCEPHIN) 1 g in sodium chloride 0.9 % 100 mL IVPB        1 g 200 mL/hr over 30 Minutes Intravenous  Once 01/06/21 1208 01/06/21 1250   01/06/21 1215  azithromycin (ZITHROMAX) 500 mg in sodium chloride 0.9 % 250 mL IVPB  Status:  Discontinued        500 mg 250 mL/hr over 60 Minutes Intravenous  Once 01/06/21 1208 01/06/21 1428   01/06/21 1200  cefTRIAXone (ROCEPHIN) 2 g in sodium chloride 0.9 % 100 mL IVPB        2 g 200 mL/hr over 30 Minutes Intravenous Every 24 hours 01/06/21 1316 01/11/21 1159          Subjective:  Patient seen and examined today.  She seems to be slightly more tired and lethargic today.  Not as perky in her conversation as she was yesterday.  Appears comfortable though.  Objective: Vitals:   01/08/21 1940 01/09/21 0400 01/09/21 0430 01/09/21 1134  BP: 126/74  (!) 149/75 (!) 111/53  Pulse: (!) 57  (!) 56 (!) 54  Resp: _1 Temp: 97.6 F (36.4 C) 98.2 F (36.8 C)  98.1 F (36.7 C)  TempSrc: Oral Oral  Oral  SpO2: 95%  97% 95%  Weight:  52.1 kg    Height:         Intake/Output Summary (Last 24 hours) at 01/09/2021 1213 Last data filed at 01/09/2021 1128 Gross per 24 hour  Intake 1108 ml  Output 950 ml  Net 158 ml    Filed Weights   01/07/21 0412 01/08/21 0500 01/09/21 0400  Weight: 51.7 kg 49.8 kg 52.1 kg    Examination:  General exam: Appears calm and comfortable  Respiratory system: Rhonchi and crackles bilaterally. Respiratory effort normal. Cardiovascular system: S1 & S2 heard, RRR. No JVD, murmurs, rubs, gallops or clicks. No pedal edema. Gastrointestinal system: Abdomen is nondistended, soft and nontender. No organomegaly or masses felt. Normal bowel sounds heard. Central nervous system: Alert and oriented x2. No focal neurological deficits. Extremities: Symmetric 5 x 5 power. Skin: No rashes, lesions or ulcers.  Psychiatry: Judgement and insight appear poor  Data Reviewed: I have personally reviewed following labs and imaging studies  CBC: Recent Labs  Lab 01/06/21 1025 01/06/21 2215 01/07/21 0324 01/08/21 0338 01/09/21 0439  WBC 18.8*  --  12.4* 17.5* 15.4*  NEUTROABS 16.5*  --   --  15.3* 13.2*  HGB 9.6* 9.0* 10.3* 9.5* 10.6*  HCT 29.3* 27.6* 30.8* 28.8* 31.4*  MCV 92.1  --  91.7 92.0 90.8  PLT 148*  --  144* 193 606    Basic Metabolic Panel: Recent Labs  Lab 01/06/21 1025 01/07/21 0324 01/08/21 0338 01/09/21 0439  NA 130* 132* 129* 132*  K 4.6 4.0 3.5 3.4*  CL 97* 96* 96* 98  CO2 21* _0 GLUCOSE 88 123* 103* 90  BUN 17 15 25* 21  CREATININE 1.14* 1.05* 0.98 0.96  CALCIUM 8.5* 8.5* 8.2* 8.3*  MG  --  2.0 1.9  --     GFR: Estimated Creatinine Clearance: 24.8 mL/min (by C-G formula based on SCr of 0.96 mg/dL). Liver Function Tests: No results for input(s): AST, ALT, ALKPHOS, BILITOT, PROT, ALBUMIN in the last 168 hours. No results for input(s): LIPASE, AMYLASE in the last 168 hours. No results for input(s): AMMONIA in the last 168 hours. Coagulation Profile: No results for input(s): INR,  PROTIME in the last 168 hours. Cardiac Enzymes: No results for input(s): CKTOTAL, CKMB, CKMBINDEX, TROPONINI in the last 168 hours. BNP (last 3 results) No results for input(s): PROBNP in the last 8760 hours. HbA1C: No results for input(s): HGBA1C in the last 72 hours. CBG: No results for input(s): GLUCAP in the last 168 hours. Lipid Profile: No results for input(s): CHOL, HDL, LDLCALC, TRIG, CHOLHDL, LDLDIRECT in the last 72 hours. Thyroid Function Tests: Recent Labs    01/06/21 2215  TSH 2.214    Anemia Panel: No results for input(s): VITAMINB12, FOLATE, FERRITIN, TIBC, IRON, RETICCTPCT in the last 72 hours. Sepsis Labs: Recent Labs  Lab 01/06/21 1025 01/06/21 1546  PROCALCITON  --  0.27  LATICACIDVEN 1.9  --      Recent Results (from the past 240 hour(s))  Resp Panel by RT-PCR (Flu A&B, Covid) Nasopharyngeal Swab     Status: None   Collection Time: 01/06/21  9:47 AM   Specimen: Nasopharyngeal Swab; Nasopharyngeal(NP) swabs in vial transport medium  Result Value Ref Range Status   SARS Coronavirus 2 by RT PCR NEGATIVE NEGATIVE Final    Comment: (NOTE) SARS-CoV-2 target nucleic acids are NOT DETECTED.  The SARS-CoV-2 RNA is generally detectable in upper respiratory specimens during the acute phase of infection. The lowest concentration of SARS-CoV-2 viral copies this assay can detect is 138 copies/mL. A negative result does not preclude SARS-Cov-2 infection and should not be used as the sole basis for treatment or other patient management decisions. A negative result may occur with  improper specimen collection/handling, submission of specimen other than nasopharyngeal swab, presence of viral mutation(s) within the areas targeted by this assay, and inadequate number of viral copies(<138 copies/mL). A negative result must be combined with clinical observations, patient history, and epidemiological information. The expected result is Negative.  Fact Sheet for  Patients:  EntrepreneurPulse.com.au  Fact Sheet for Healthcare Providers:  IncredibleEmployment.be  This test is no t yet approved or cleared by the [place] FDA and  has been authorized for detection and/or diagnosis of SARS-CoV-2 by FDA under an Emergency Use Authorization (EUA). This EUA will remain  in effect (meaning this test can be used) for the duration of the COVID-19 declaration under Section 564(b)(1) of the Act, 21 U.S.C.section 360bbb-3(b)(1), unless the authorization is terminated  or revoked sooner.       Influenza A by PCR NEGATIVE NEGATIVE  Final   Influenza B by PCR NEGATIVE NEGATIVE Final    Comment: (NOTE) The Xpert Xpress SARS-CoV-2/FLU/RSV plus assay is intended as an aid in the diagnosis of influenza from Nasopharyngeal swab specimens and should not be used as a sole basis for treatment. Nasal washings and aspirates are unacceptable for Xpert Xpress SARS-CoV-2/FLU/RSV testing.  Fact Sheet for Patients: EntrepreneurPulse.com.au  Fact Sheet for Healthcare Providers: IncredibleEmployment.be  This test is not yet approved or cleared by the [place] FDA and has been authorized for detection and/or diagnosis of SARS-CoV-2 by FDA under an Emergency Use Authorization (EUA). This EUA will remain in effect (meaning this test can be used) for the duration of the COVID-19 declaration under Section 564(b)(1) of the Act, 21 U.S.C. section 360bbb-3(b)(1), unless the authorization is terminated or revoked.  Performed at [facility], [place] [address] [place], [place] [address]   Respiratory (~20 pathogens) panel by PCR     Status: Abnormal   Collection Time: 01/06/21  8:18 PM   Specimen: Nasopharyngeal Swab; Respiratory  Result Value Ref Range Status   Adenovirus NOT DETECTED NOT DETECTED Final   Coronavirus 229E NOT DETECTED NOT DETECTED Final    Comment: (NOTE) The Coronavirus  on the Respiratory Panel, DOES NOT test for the novel  Coronavirus (2019 nCoV)    Coronavirus HKU1 NOT DETECTED NOT DETECTED Final   Coronavirus NL63 NOT DETECTED NOT DETECTED Final   Coronavirus OC43 NOT DETECTED NOT DETECTED Final   Metapneumovirus NOT DETECTED NOT DETECTED Final   Rhinovirus / Enterovirus DETECTED (A) NOT DETECTED Final   Influenza A NOT DETECTED NOT DETECTED Final   Influenza B NOT DETECTED NOT DETECTED Final   Parainfluenza Virus 1 NOT DETECTED NOT DETECTED Final   Parainfluenza Virus 2 NOT DETECTED NOT DETECTED Final   Parainfluenza Virus 3 NOT DETECTED NOT DETECTED Final   Parainfluenza Virus 4 NOT DETECTED NOT DETECTED Final   Respiratory Syncytial Virus NOT DETECTED NOT DETECTED Final   Bordetella pertussis NOT DETECTED NOT DETECTED Final   Bordetella Parapertussis NOT DETECTED NOT DETECTED Final   Chlamydophila pneumoniae NOT DETECTED NOT DETECTED Final   Mycoplasma pneumoniae NOT DETECTED NOT DETECTED Final    Comment: Performed at [facility], [place]. [address] [place], [place] [address]       Radiology Studies: No results found.  Scheduled Meds:  acetaminophen  650 mg Oral Q8H   amiodarone  200 mg Oral Daily   apixaban  2.5 mg Oral BID   atorvastatin  20 mg Oral q1800   bacitracin  1 application Topical BID   darifenacin  15 mg Oral Daily   feeding supplement  237 mL Oral BID BM   furosemide  20 mg Oral QODAY   guaiFENesin  600 mg Oral BID   isosorbide mononitrate  60 mg Oral Daily   levothyroxine  62.5 mcg Oral QAC breakfast   multivitamin with minerals  1 tablet Oral Daily   saccharomyces boulardii  250 mg Oral BID   sodium chloride flush  3 mL Intravenous Q12H   vitamin B-12  1,000 mcg Oral Daily   Continuous Infusions:  cefTRIAXone (ROCEPHIN)  IV 2 g (01/08/21 1250)   doxycycline (VIBRAMYCIN) IV 100 mg (01/09/21 0949)     LOS: 3 days   Time spent: 28 minutes   Darliss Cheney, MD [facility]  01/09/2021, 12:13 PM   Please page via [facility] and do not message via secure chat for anything urgent. Secure chat can  be used for anything non urgent.  How to contact the [facility] Attending or Consulting provider [place] or covering provider during after hours [place], for this patient?  Check the care team in [facility] and look for a) attending/consulting TRH provider listed and b) the [facility] team listed. Page or secure chat [phone]. Log into [email or web address] and use [place]'s universal password to access. If you do not have the password, please contact the hospital operator. Locate the [facility] provider you are looking for under [facility] and page to a number that you can be directly reached. If you still have difficulty reaching the provider, please page the [facility] (Director on Call) for the Hospitalists listed on amion for assistance.

## 2021-01-09 NOTE — TOC Progression Note (Signed)
Transition of Care [facility]) - Progression Note    Patient Details  Name: Cathy [name] MRN: [id number] Date of Birth: 05/10/1927  Transition of Care [facility]) CM/SW [place], [place] Phone Number: 01/09/2021, 4:43 PM  Clinical Narrative:    CSW spoke with pt daughter and daughter in law about pt going to [facility] which is the family's follow up choice. The family doe snot want any other facility bc of ratings. CSW left home care resources for family after pt Dc from SNF.   Expected Discharge Plan: [place] Barriers to Discharge: Continued Medical Work up  Expected Discharge Plan and Services Expected Discharge Plan: [place] In-house Referral: Clinical Social Work Discharge Planning Services: CM Consult Post Acute Care Choice: [place] arrangements for the past 2 months: Single Family Home                                       Social Determinants of Health (SDOH) Interventions    Readmission Risk Interventions No flowsheet data found.

## 2021-01-09 NOTE — Progress Notes (Signed)
   Follow-Up Visit   Subjective  Cathy [name] is a 87 y.o. female who presents for the following: Skin Problem (Lesions on right leg- that don't really bother her - new growing lesion on left forearm- painful & growing).  Check growths on the arm and leg Location:  Duration:  Quality:  Associated Signs/Symptoms: Modifying Factors:  Severity:  Timing: Context:   Objective  Well appearing patient in no apparent distress; mood and affect are within normal limits. Left Forearm - Posterior     Right Lower Leg - Anterior 9 mm moderately thick pink crust which could represent carcinoma in situ, hypertrophic AK more porokeratosis.  Not bothersome to patient    A focused examination was performed including head, neck, arms, legs. Relevant physical exam findings are noted in the Assessment and Plan.   Assessment & Plan    Neoplasm of uncertain behavior of skin Left Forearm - Posterior  Skin / nail biopsy Type of biopsy: tangential   Informed consent: discussed and consent obtained   Timeout: patient name, date of birth, surgical site, and procedure verified   Procedure prep:  Patient was prepped and draped in usual sterile fashion (Non sterile) Prep type:  Chlorhexidine Anesthesia: the lesion was anesthetized in a standard fashion   Anesthetic:  1% lidocaine w/ epinephrine 1-100,000 local infiltration Instrument used: flexible razor blade   Outcome: patient tolerated procedure well   Post-procedure details: wound care instructions given    Specimen 1 - Surgical pathology Differential Diagnosis: bcc vs scc  Check Margins: No  AK (actinic keratosis) Right Lower Leg - Anterior  Would be most reasonable to leave this historically stable lesion on a 87 year old leg unless there is significant clinical change.      I, Lavonna Monarch, MD, have reviewed all documentation for this visit.  The documentation on 01/09/21 for the exam, diagnosis, procedures, and orders are all  accurate and complete.

## 2021-01-09 NOTE — Care Management Important Message (Signed)
Important Message  Patient Details  Name: Cathy [name] MRN: [id number] Date of Birth: 1927-12-10   Medicare Important Message Given:  Yes     Shelda Altes 01/09/2021, 10:51 AM

## 2021-01-09 NOTE — Progress Notes (Signed)
SATURATION QUALIFICATIONS: (This note is used to comply with regulatory documentation for home oxygen)  Patient Saturations on Room Air at Rest = 85%  Patient Saturations on Room Air while Ambulating = 82%  Patient Saturations on 3 Liters of oxygen while Ambulating = 93%  Please briefly explain why patient needs home oxygen: pt requires 3L of oxygen while ambulating. While resting pts oxygen saturation on 2L is 94%.  Pt's sats decrease to 82% while ambulating without supplemental O2.

## 2021-01-10 DIAGNOSIS — J189 Pneumonia, unspecified organism: Secondary | ICD-10-CM | POA: Diagnosis not present

## 2021-01-10 MED ORDER — POTASSIUM CHLORIDE CRYS ER 20 MEQ PO TBCR
40.0000 meq | EXTENDED_RELEASE_TABLET | Freq: Once | ORAL | Status: AC
  Administered 2021-01-10: 40 meq via ORAL
  Filled 2021-01-10: qty 2

## 2021-01-10 MED ORDER — FUROSEMIDE 10 MG/ML IJ SOLN
40.0000 mg | Freq: Once | INTRAMUSCULAR | Status: AC
  Administered 2021-01-10: 40 mg via INTRAVENOUS
  Filled 2021-01-10: qty 4

## 2021-01-10 MED ORDER — CEFDINIR 300 MG PO CAPS: 300.0000 mg | ORAL_CAPSULE | Freq: Two times a day (BID) | ORAL | 0 refills | Status: DC

## 2021-01-10 NOTE — Discharge Summary (Signed)
Physician Discharge Summary  Cathy [name] MWU:[id number] DOB: September 24, 1927 DOA: 01/06/2021  PCP: Biagio Borg, MD  Admit date: 01/06/2021 Discharge date: 01/10/2021 30 Day Unplanned Readmission Risk Score    Flowsheet Row ED to Hosp-Admission (Current) from 01/06/2021 in [facility] PCU  30 Day Unplanned Readmission Risk Score (%) 18.83 Filed at 01/10/2021 0400       This score is the patient's risk of an unplanned readmission within 30 days of being discharged (0 -100%). The score is based on dignosis, age, lab data, medications, orders, and past utilization.   Low:  0-14.9   Medium: 15-21.9   High: 22-29.9   Extreme: 30 and above          Admitted From: Home Disposition: SNF  Recommendations for Outpatient Follow-up:  Follow up with PCP in 1-2 weeks Please obtain BMP/CBC in one week Please follow up with your PCP on the following pending results: Unresulted Labs (From admission, onward)    None         Home Health: None Equipment/Devices: None  Discharge Condition: Stable CODE STATUS: Full code Diet recommendation: Cardiac  Subjective: Seen and examined.  She has no complaints.  She is fully alert and oriented.  Brief/Interim Summary: Cathy [name] is a 87 y.o. female with medical history significant of HTN, HLD, HFpEF, paroxysmal atrial flutter, hiatal hernia, and osteoarthritis who presented with complaints of cough and shortness of breath for approximately 2 days associated with diffuse body aches. Noted also having sore throat, fever, chills, congestion, intermittently productive cough, mild substernal chest discomfort, and dysuria.  She had fallen and hit her head.    Upon admission to the emergency department patient was seen to be afebrile, suppressions 18-23, blood pressure 93/47-126/66, and O2 saturations noted to be as low as 90% on room air placed on 2-3 L nasal cannula oxygen.  Chest x-ray noted large hiatal hernia with mild left pleural  effusion with associated atelectasis versus infiltrate.  CT scan of the head noted no acute cranial abnormality and improvement in right parietal scalp hematoma.  Patient was given Rocephin, Zithromax, DuoNeb and some fluids and was admitted to hospital service.  Further hospitalization course as below.  Acute hypoxic respiratory failure/severe sepsis secondary to suspected community-acquired pneumonia/rhinovirus infection, POA: Patient met severe sepsis criteria upon admission based on tachypnea, leukocytosis, acute hypoxic respiratory failure and pneumonia. Influenza and COVID-19 screening were negative but she was positive for rhinovirus. Urine antigen for streptococci and legionella negative.  Afebrile.  Leukocytosis improving.  Patient clinically improving and now on 2 to 3 L of oxygen.  She is much better and is stable.  She received IV Rocephin and Zithromax during the hospitalization.  She will be discharged to SNF today on 3 more days of oral cefdinir.  I recommend her using incentive spirometry even at SNF so her oxygen can be weaned down.   Heart failure with preserved EF: Based on my chart review and personally examining the patient, I do not think that patient had acute component of congestive heart failure.  She has chronic diastolic congestive heart failure and she appears euvolemic.  Her BNP although elevated but is very close to her baseline and chest x-ray did not reveal any Signs of pulmonary edema in the beginning.  We continued his home Lasix which is 20 mg every other day.  Chest x-ray shows slightly more pleural effusion compared to the initial chest x-ray.  I am going to give her  1 dose of IV Lasix 40 mg x 1 along with potassium chloride replacement and then she will be discharged on her home dose of Lasix.[name]  Permanent atrial fibrillation: Rates controlled. Continue amiodarone and Eliquis   History of CAD/chest pain/elevated troponin: Patient did report having some mild substernal  chest discomfort.  High-sensitivity troponin 23 with repeat 27.  Thought to be relatively flat which suspect possibly related to demand.   CKD stage IIIa: Stable   Normocytic anemia: Stable.   Hyponatremia: Labs indicate possible SIADH in the face of pneumonia.  However sodium improved to 132 yesterday.   Hypokalemia: Resolved   Fall at home: Seen by PT OT, they recommend SNF.  Patient is being discharged to SNF today.   Chronic thrombocytopenia: Stable.  No signs of bleeding.   Hypothyroidism: Continue Synthroid.  Discharge Diagnoses:  Principal Problem:   Community acquired pneumonia Active Problems:   Diaphragmatic hernia   Atrial fibrillation and flutter (HCC)   Coronary artery disease involving native coronary artery of native heart without angina pectoris   Heart failure with preserved ejection fraction (HCC)   Hyponatremia   Acute respiratory failure with hypoxia (HCC)   Severe sepsis (HCC)   Malnutrition of moderate degree    Discharge Instructions   Allergies as of 01/10/2021       Reactions   Amoxicillin-pot Clavulanate Diarrhea   Has patient had a PCN reaction causing immediate rash, facial/tongue/throat swelling, SOB or lightheadedness with hypotension: no Has patient had a PCN reaction causing severe rash involving mucus membranes or skin necrosis:  no Has patient had a PCN reaction that required hospitalization: no Has patient had a PCN reaction occurring within the last 10 years no If all of the above answers are "NO", then may proceed with Cephalosporin use.   Penicillins Diarrhea   Sulfonamide Derivatives Nausea Only        Medication List     TAKE these medications    acetaminophen 325 MG tablet Commonly known as: TYLENOL Take 650 mg by mouth every 8 (eight) hours.   amiodarone 200 MG tablet Commonly known as: PACERONE TAKE 1 TABLET DAILY   apixaban 2.5 MG Tabs tablet Commonly known as: Eliquis Take 1 tablet (2.5 mg total) by mouth 2  (two) times daily. Overdue for labwork, MUST have labwork at upcoming OV with MD for FUTURE refills.   atorvastatin 20 MG tablet Commonly known as: LIPITOR TAKE 1 TABLET DAILY AT 6:00IN THE EVENING What changed:  how much to take how to take this when to take this additional instructions   bacitracin ointment Apply 1 application topically 2 (two) times daily.   calcium-vitamin D 500-200 MG-UNIT tablet Commonly known as: OSCAL WITH D Take 1 tablet by mouth daily.   cefdinir 300 MG capsule Commonly known as: OMNICEF Take 1 capsule (300 mg total) by mouth 2 (two) times daily for 3 days.   Delsym Cough/Cold Daytime 5-10-200-325 MG/10ML Liqd Generic drug: Phenylephrine-DM-GG-APAP Take 10 mLs by mouth every 12 (twelve) hours as needed (cough).   dextromethorphan-guaiFENesin 30-600 MG 12hr tablet Commonly known as: MUCINEX DM Take 1 tablet by mouth 2 (two) times daily.   esomeprazole 40 MG capsule Commonly known as: NEXIUM Take 1 capsule (40 mg total) by mouth daily at 12 noon.   furosemide 20 MG tablet Commonly known as: LASIX Take 1 tablet (20 mg total) by mouth every other day. What changed: when to take this   ibuprofen 100 MG tablet Commonly known as: ADVIL Take  200 mg by mouth every 6 (six) hours as needed for fever.   isosorbide mononitrate 60 MG 24 hr tablet Commonly known as: IMDUR TAKE 1 TABLET DAILY   polyethylene glycol 17 g packet Commonly known as: MIRALAX / GLYCOLAX Take 17 g by mouth daily.   solifenacin 10 MG tablet Commonly known as: VESICARE Take 1 tablet (10 mg total) by mouth daily.   Synthroid 125 MCG tablet Generic drug: levothyroxine TAKE 1/2 TABLET DAILY. What changed:  how much to take how to take this when to take this   Transport Chair Misc Use as directed three times daily   vitamin B-12 1000 MCG tablet Commonly known as: CYANOCOBALAMIN Take 1 tablet (1,000 mcg total) by mouth daily.               Durable Medical  Equipment  (From admission, onward)           Start     Ordered   01/08/21 1425  For home use only DME oxygen  Once       Question Answer Comment  Length of Need Lifetime   Mode or (Route) Nasal cannula   Liters per Minute 3   Frequency Continuous (stationary and portable oxygen unit needed)   Oxygen conserving device Yes   Oxygen delivery system Gas      01/08/21 1425            Follow-up Information     Biagio Borg, MD Follow up in 1 week(s).   Specialties: Internal Medicine, Radiology Contact information: [place] [address] [phone]         Constance Haw, MD .   Specialty: Cardiology Contact information: [address] [place] [address] [phone]                Allergies  Allergen Reactions   Amoxicillin-Pot Clavulanate Diarrhea    Has patient had a PCN reaction causing immediate rash, facial/tongue/throat swelling, SOB or lightheadedness with hypotension: no Has patient had a PCN reaction causing severe rash involving mucus membranes or skin necrosis:  no Has patient had a PCN reaction that required hospitalization: no Has patient had a PCN reaction occurring within the last 10 years no If all of the above answers are "NO", then may proceed with Cephalosporin use.    Penicillins Diarrhea   Sulfonamide Derivatives Nausea Only    Consultations: None   Procedures/Studies: DG Shoulder Right  Result Date: 12/28/2020 CLINICAL DATA:  [place].  Right shoulder pain. EXAM: RIGHT SHOULDER - 2+ VIEW COMPARISON:  None. FINDINGS: Advanced glenohumeral joint degenerative changes with joint space narrowing and spurring. Moderate AC joint degenerative changes. No acute fracture is identified. On the scapular Y-view the patient's humeral head is projecting posteriorly. This could be positional but recommend a axillary view to exclude a posterior dislocation The visualized right ribs are intact IMPRESSION: 1.  Advanced glenohumeral joint degenerative changes but no definite acute fracture. 2. The patient's humeral head is projecting posteriorly in relation to the glenoid this could be projectional but recommend an axillary view to exclude a posterior dislocation. Electronically Signed   By: Marijo Sanes M.D.   On: 12/28/2020 15:48   DG Wrist 2 Views Right  Result Date: 12/28/2020 CLINICAL DATA:  [place].  Right wrist pain. EXAM: RIGHT WRIST - 2 VIEW COMPARISON:  11/19/2015 FINDINGS: Advanced and progressive degenerative changes involving the wrist and the carpometacarpal joint of the thumb. No acute fracture is  identified. IMPRESSION: 1. No acute wrist fracture. 2. Advanced and progressive degenerative changes. Electronically Signed   By: Marijo Sanes M.D.   On: 12/28/2020 15:44   CT Head Wo Contrast  Result Date: 01/06/2021 CLINICAL DATA:  Head trauma, minor (Age >= 65y) recent head trauma, fever, cough EXAM: CT HEAD WITHOUT CONTRAST TECHNIQUE: Contiguous axial images were obtained from the base of the skull through the vertex without intravenous contrast. COMPARISON:  Head CT 12/28/2020 FINDINGS: Brain: No evidence of acute intracranial hemorrhage or extra-axial collection. The ventricles are unchanged in size.Scattered subcortical and periventricular white matter hypodensities, nonspecific but likely sequela of chronic small vessel ischemic disease.Mild cerebral atrophy Vascular: Stable vascular calcifications. Skull: Normal. Negative for fracture or focal lesion. Sinuses/Orbits: Mucosal thickening of the anterior ethmoid air cells. Small mucous retention cyst in the left maxillary sinus. The mastoid air cells are clear. Other: Slight improvement in the right high parietal scalp hematoma. IMPRESSION: No acute intracranial abnormality. Unchanged sequela of chronic small vessel ischemic disease and cerebral atrophy. Slight improvement in the right high parietal scalp hematoma. Electronically Signed   By: Maurine Simmering M.D.   On: 01/06/2021 12:03   CT HEAD WO CONTRAST  Result Date: 12/28/2020 CLINICAL DATA:  [place].  Hit head. EXAM: CT HEAD WITHOUT CONTRAST CT CERVICAL SPINE WITHOUT CONTRAST TECHNIQUE: Multidetector CT imaging of the head and cervical spine was performed following the standard protocol without intravenous contrast. Multiplanar CT image reconstructions of the cervical spine were also generated. COMPARISON:  Head CT 03/01/2018 FINDINGS: CT HEAD FINDINGS Brain: Stable age related cerebral atrophy, ventriculomegaly and periventricular white matter disease. No extra-axial fluid collections are identified. No CT findings for acute hemispheric infarction or intracranial hemorrhage. No mass lesions. The brainstem and cerebellum are normal. Vascular: Stable vascular calcifications. No aneurysm or hyperdense vessels. Skull: No skull fracture or bone lesions. Sinuses/Orbits: The paranasal sinuses and mastoid air cells are clear. The globes are intact. Other: Right high parietal scalp hematoma without underlying fracture. CT CERVICAL SPINE FINDINGS Alignment: Moderate degenerative anterior subluxation of C7 compared to T1 due to advanced facet disease. The other cervical vertebral bodies are grossly normally aligned. The facets are normally aligned. Skull base and vertebrae: Age related degenerative changes but no acute cervical spine fracture. Soft tissues and spinal canal: No prevertebral fluid or swelling. No visible canal hematoma. Disc levels: The spinal canal is fairly generous. No significant spinal stenosis. Mild multilevel foraminal stenosis due to uncinate spurring and facet disease. Upper chest: The lung apices are grossly clear. Other: Bilateral carotid artery calcifications. IMPRESSION: 1. Stable age related cerebral atrophy, ventriculomegaly and periventricular white matter disease. 2. No acute intracranial findings or skull fracture. 3. Right high parietal scalp hematoma without underlying fracture. 4.  Degenerative cervical spondylosis with multilevel disc disease and facet disease but no acute cervical spine fracture. Electronically Signed   By: Marijo Sanes M.D.   On: 12/28/2020 15:25   CT CERVICAL SPINE WO CONTRAST  Result Date: 12/28/2020 CLINICAL DATA:  [place].  Hit head. EXAM: CT HEAD WITHOUT CONTRAST CT CERVICAL SPINE WITHOUT CONTRAST TECHNIQUE: Multidetector CT imaging of the head and cervical spine was performed following the standard protocol without intravenous contrast. Multiplanar CT image reconstructions of the cervical spine were also generated. COMPARISON:  Head CT 03/01/2018 FINDINGS: CT HEAD FINDINGS Brain: Stable age related cerebral atrophy, ventriculomegaly and periventricular white matter disease. No extra-axial fluid collections are identified. No CT findings for acute hemispheric infarction or intracranial hemorrhage. No mass lesions. The  brainstem and cerebellum are normal. Vascular: Stable vascular calcifications. No aneurysm or hyperdense vessels. Skull: No skull fracture or bone lesions. Sinuses/Orbits: The paranasal sinuses and mastoid air cells are clear. The globes are intact. Other: Right high parietal scalp hematoma without underlying fracture. CT CERVICAL SPINE FINDINGS Alignment: Moderate degenerative anterior subluxation of C7 compared to T1 due to advanced facet disease. The other cervical vertebral bodies are grossly normally aligned. The facets are normally aligned. Skull base and vertebrae: Age related degenerative changes but no acute cervical spine fracture. Soft tissues and spinal canal: No prevertebral fluid or swelling. No visible canal hematoma. Disc levels: The spinal canal is fairly generous. No significant spinal stenosis. Mild multilevel foraminal stenosis due to uncinate spurring and facet disease. Upper chest: The lung apices are grossly clear. Other: Bilateral carotid artery calcifications. IMPRESSION: 1. Stable age related cerebral atrophy, ventriculomegaly  and periventricular white matter disease. 2. No acute intracranial findings or skull fracture. 3. Right high parietal scalp hematoma without underlying fracture. 4. Degenerative cervical spondylosis with multilevel disc disease and facet disease but no acute cervical spine fracture. Electronically Signed   By: Marijo Sanes M.D.   On: 12/28/2020 15:25   DG Pelvis Portable  Result Date: 12/28/2020 CLINICAL DATA:  Trauma, fall EXAM: PORTABLE PELVIS 1-2 VIEWS COMPARISON:  06/30/2019 FINDINGS: There is previous internal fixation in the proximal left femur. No definite recent displaced fracture is seen. There is no dislocation. Marked degenerative changes are noted in the visualized lower lumbar spine. Osteopenia is seen in bony structures. Arterial calcifications are seen in soft tissues. IMPRESSION: Previous internal fixation in the left femoral neck. No recent displaced fracture or dislocation is seen. Lumbar spondylosis. Electronically Signed   By: Elmer Picker M.D.   On: 12/28/2020 15:07   DG Hand 2 View Right  Result Date: 12/28/2020 CLINICAL DATA:  [place]. EXAM: RIGHT HAND - 2 VIEW COMPARISON:  Radiographs from 2017 FINDINGS: Advanced and progressive degenerative changes involving the hand and wrist. No acute hand fractures are identified. IMPRESSION: 1. Advanced and progressive degenerative changes involving the hand and wrist. 2. No acute fracture. Electronically Signed   By: Marijo Sanes M.D.   On: 12/28/2020 15:45   DG CHEST PORT 1 VIEW  Result Date: 01/09/2021 CLINICAL DATA:  CHF EXAM: PORTABLE CHEST 1 VIEW COMPARISON:  Chest x-ray 01/06/2021 FINDINGS: Heart is enlarged. Mediastinum appears grossly stable. Calcified plaques in the thoracic aorta. Large hiatal hernia containing bowel. Interval increasing hazy opacities in the right lower lung zone. Stable small to moderate left pleural effusion. No pneumothorax. IMPRESSION: 1. Cardiomegaly. 2. Increasing hazy opacities at the right lower  lung zone likely represents layering pleural effusion with atelectasis. 3. Stable small to moderate left pleural effusion. 4. Large hiatal hernia. Electronically Signed   By: Ofilia Neas M.D.   On: 01/09/2021 13:05   DG Chest Port 1 View  Result Date: 01/06/2021 CLINICAL DATA:  Shortness of breath, fever, cough. EXAM: PORTABLE CHEST 1 VIEW COMPARISON:  December 28, 2020. FINDINGS: Stable cardiomegaly. Large hiatal hernia. Right lung is clear. Mild left pleural effusion is noted with associated left basilar atelectasis or infiltrate. Bony thorax is unremarkable. IMPRESSION: Large hiatal hernia. Mild left pleural effusion is noted with associated left basilar atelectasis or infiltrate. Electronically Signed   By: Marijo Conception M.D.   On: 01/06/2021 10:18   DG Chest Port 1 View  Result Date: 12/28/2020 CLINICAL DATA:  87 year old female with fall.  On blood thinners. EXAM: PORTABLE CHEST  1 VIEW COMPARISON:  06/28/2019 and prior radiographs FINDINGS: Cardiomegaly and very large hiatal hernia again noted. There is no evidence of focal airspace disease, pulmonary edema, suspicious pulmonary nodule/mass, pleural effusion, or pneumothorax. No acute bony abnormalities are identified. IMPRESSION: No evidence of acute abnormality. Cardiomegaly and very large hiatal hernia. Electronically Signed   By: Margarette Canada M.D.   On: 12/28/2020 15:05   DG Knee Complete 4 Views Right  Result Date: 12/28/2020 CLINICAL DATA:  [place].  Right knee pain. EXAM: RIGHT KNEE - COMPLETE 4+ VIEW COMPARISON:  Radiographs 11/19/2015 FINDINGS: The total knee arthroplasty components are well seated. No complicating features. No periprosthetic fracture. No joint effusion. Vascular calcifications noted. IMPRESSION: 1. No acute bony findings or joint effusion. 2. Well seated components of a total knee arthroplasty. Electronically Signed   By: Marijo Sanes M.D.   On: 12/28/2020 15:49     Discharge Exam: Vitals:   01/10/21 0345  01/10/21 0400  BP: (!) 183/74 (!) 149/92  Pulse: (!) 58 (!) 57  Resp: (!) 22 20  Temp: 98.1 F (36.7 C)   SpO2: 95% 96%   Vitals:   01/10/21 0048 01/10/21 0300 01/10/21 0345 01/10/21 0400  BP:   (!) 183/74 (!) 149/92  Pulse:  (!) 55 (!) 58 (!) 57  Resp:  18 (!) 22 20  Temp:   98.1 F (36.7 C)   TempSrc:   Oral   SpO2:  96% 95% 96%  Weight: 49.9 kg     Height:        General: Pt is alert, awake, not in acute distress Cardiovascular: RRR, S1/S2 +, no rubs, no gallops Respiratory: Faint rhonchi at the bases bilaterally. Abdominal: Soft, NT, ND, bowel sounds + Extremities: no edema, no cyanosis    The results of significant diagnostics from this hospitalization (including imaging, microbiology, ancillary and laboratory) are listed below for reference.     Microbiology: Recent Results (from the past 240 hour(s))  Resp Panel by RT-PCR (Flu A&B, Covid) Nasopharyngeal Swab     Status: None   Collection Time: 01/06/21  9:47 AM   Specimen: Nasopharyngeal Swab; Nasopharyngeal(NP) swabs in vial transport medium  Result Value Ref Range Status   SARS Coronavirus 2 by RT PCR NEGATIVE NEGATIVE Final    Comment: (NOTE) SARS-CoV-2 target nucleic acids are NOT DETECTED.  The SARS-CoV-2 RNA is generally detectable in upper respiratory specimens during the acute phase of infection. The lowest concentration of SARS-CoV-2 viral copies this assay can detect is 138 copies/mL. A negative result does not preclude SARS-Cov-2 infection and should not be used as the sole basis for treatment or other patient management decisions. A negative result may occur with  improper specimen collection/handling, submission of specimen other than nasopharyngeal swab, presence of viral mutation(s) within the areas targeted by this assay, and inadequate number of viral copies(<138 copies/mL). A negative result must be combined with clinical observations, patient history, and epidemiological information. The  expected result is Negative.  Fact Sheet for Patients:  EntrepreneurPulse.com.au  Fact Sheet for Healthcare Providers:  IncredibleEmployment.be  This test is no t yet approved or cleared by the [place] FDA and  has been authorized for detection and/or diagnosis of SARS-CoV-2 by FDA under an Emergency Use Authorization (EUA). This EUA will remain  in effect (meaning this test can be used) for the duration of the COVID-19 declaration under Section 564(b)(1) of the Act, 21 U.S.C.section 360bbb-3(b)(1), unless the authorization is terminated  or revoked sooner.  Influenza A by PCR NEGATIVE NEGATIVE Final   Influenza B by PCR NEGATIVE NEGATIVE Final    Comment: (NOTE) The Xpert Xpress SARS-CoV-2/FLU/RSV plus assay is intended as an aid in the diagnosis of influenza from Nasopharyngeal swab specimens and should not be used as a sole basis for treatment. Nasal washings and aspirates are unacceptable for Xpert Xpress SARS-CoV-2/FLU/RSV testing.  Fact Sheet for Patients: EntrepreneurPulse.com.au  Fact Sheet for Healthcare Providers: IncredibleEmployment.be  This test is not yet approved or cleared by the [place] FDA and has been authorized for detection and/or diagnosis of SARS-CoV-2 by FDA under an Emergency Use Authorization (EUA). This EUA will remain in effect (meaning this test can be used) for the duration of the COVID-19 declaration under Section 564(b)(1) of the Act, 21 U.S.C. section 360bbb-3(b)(1), unless the authorization is terminated or revoked.  Performed at [facility], [place] [address], [place], [place] [address]   Urine Culture     Status: None   Collection Time: 01/06/21  7:57 PM   Specimen: Urine, Clean Catch  Result Value Ref Range Status   Specimen Description URINE, CLEAN CATCH  Final   Special Requests NONE  Final   Culture   Final    NO GROWTH Performed at  [facility], [place] [address] [place], [place] [address]    Report Status 01/09/2021 FINAL  Final  Respiratory (~20 pathogens) panel by PCR     Status: Abnormal   Collection Time: 01/06/21  8:18 PM   Specimen: Nasopharyngeal Swab; Respiratory  Result Value Ref Range Status   Adenovirus NOT DETECTED NOT DETECTED Final   Coronavirus 229E NOT DETECTED NOT DETECTED Final    Comment: (NOTE) The Coronavirus on the Respiratory Panel, DOES NOT test for the novel  Coronavirus (2019 nCoV)    Coronavirus HKU1 NOT DETECTED NOT DETECTED Final   Coronavirus NL63 NOT DETECTED NOT DETECTED Final   Coronavirus OC43 NOT DETECTED NOT DETECTED Final   Metapneumovirus NOT DETECTED NOT DETECTED Final   Rhinovirus / Enterovirus DETECTED (A) NOT DETECTED Final   Influenza A NOT DETECTED NOT DETECTED Final   Influenza B NOT DETECTED NOT DETECTED Final   Parainfluenza Virus 1 NOT DETECTED NOT DETECTED Final   Parainfluenza Virus 2 NOT DETECTED NOT DETECTED Final   Parainfluenza Virus 3 NOT DETECTED NOT DETECTED Final   Parainfluenza Virus 4 NOT DETECTED NOT DETECTED Final   Respiratory Syncytial Virus NOT DETECTED NOT DETECTED Final   Bordetella pertussis NOT DETECTED NOT DETECTED Final   Bordetella Parapertussis NOT DETECTED NOT DETECTED Final   Chlamydophila pneumoniae NOT DETECTED NOT DETECTED Final   Mycoplasma pneumoniae NOT DETECTED NOT DETECTED Final    Comment: Performed at [facility], [place]. [address], [place], [place] [address]  Resp Panel by RT-PCR (Flu A&B, Covid) Nasopharyngeal Swab     Status: None   Collection Time: 01/09/21 12:10 PM   Specimen: Nasopharyngeal Swab; Nasopharyngeal(NP) swabs in vial transport medium  Result Value Ref Range Status   SARS Coronavirus 2 by RT PCR NEGATIVE NEGATIVE Final    Comment: (NOTE) SARS-CoV-2 target nucleic acids are NOT DETECTED.  The SARS-CoV-2 RNA is generally detectable in upper respiratory specimens during the acute phase of infection.  The lowest concentration of SARS-CoV-2 viral copies this assay can detect is 138 copies/mL. A negative result does not preclude SARS-Cov-2 infection and should not be used as the sole basis for treatment or other patient management decisions. A negative result may occur  with  improper specimen collection/handling, submission of specimen other than nasopharyngeal swab, presence of viral mutation(s) within the areas targeted by this assay, and inadequate number of viral copies(<138 copies/mL). A negative result must be combined with clinical observations, patient history, and epidemiological information. The expected result is Negative.  Fact Sheet for Patients:  EntrepreneurPulse.com.au  Fact Sheet for Healthcare Providers:  IncredibleEmployment.be  This test is no t yet approved or cleared by the [place] FDA and  has been authorized for detection and/or diagnosis of SARS-CoV-2 by FDA under an Emergency Use Authorization (EUA). This EUA will remain  in effect (meaning this test can be used) for the duration of the COVID-19 declaration under Section 564(b)(1) of the Act, 21 U.S.C.section 360bbb-3(b)(1), unless the authorization is terminated  or revoked sooner.       Influenza A by PCR NEGATIVE NEGATIVE Final   Influenza B by PCR NEGATIVE NEGATIVE Final    Comment: (NOTE) The Xpert Xpress SARS-CoV-2/FLU/RSV plus assay is intended as an aid in the diagnosis of influenza from Nasopharyngeal swab specimens and should not be used as a sole basis for treatment. Nasal washings and aspirates are unacceptable for Xpert Xpress SARS-CoV-2/FLU/RSV testing.  Fact Sheet for Patients: EntrepreneurPulse.com.au  Fact Sheet for Healthcare Providers: IncredibleEmployment.be  This test is not yet approved or cleared by the [place] FDA and has been authorized for detection and/or diagnosis of SARS-CoV-2 by FDA  under an Emergency Use Authorization (EUA). This EUA will remain in effect (meaning this test can be used) for the duration of the COVID-19 declaration under Section 564(b)(1) of the Act, 21 U.S.C. section 360bbb-3(b)(1), unless the authorization is terminated or revoked.  Performed at [facility], [place] [address] [place], [place] [address]      Labs: BNP (last 3 results) Recent Labs    01/06/21 1025  BNP 829.9*   Basic Metabolic Panel: Recent Labs  Lab 01/06/21 1025 01/07/21 0324 01/08/21 0338 01/09/21 0439  NA 130* 132* 129* 132*  K 4.6 4.0 3.5 3.4*  CL 97* 96* 96* 98  CO2 21* _0 GLUCOSE 88 123* 103* 90  BUN 17 15 25* 21  CREATININE 1.14* 1.05* 0.98 0.96  CALCIUM 8.5* 8.5* 8.2* 8.3*  MG  --  2.0 1.9  --    Liver Function Tests: No results for input(s): AST, ALT, ALKPHOS, BILITOT, PROT, ALBUMIN in the last 168 hours. No results for input(s): LIPASE, AMYLASE in the last 168 hours. No results for input(s): AMMONIA in the last 168 hours. CBC: Recent Labs  Lab 01/06/21 1025 01/06/21 2215 01/07/21 0324 01/08/21 0338 01/09/21 0439  WBC 18.8*  --  12.4* 17.5* 15.4*  NEUTROABS 16.5*  --   --  15.3* 13.2*  HGB 9.6* 9.0* 10.3* 9.5* 10.6*  HCT 29.3* 27.6* 30.8* 28.8* 31.4*  MCV 92.1  --  91.7 92.0 90.8  PLT 148*  --  144* 193 210   Cardiac Enzymes: No results for input(s): CKTOTAL, CKMB, CKMBINDEX, TROPONINI in the last 168 hours. BNP: Invalid input(s): POCBNP CBG: No results for input(s): GLUCAP in the last 168 hours. D-Dimer No results for input(s): DDIMER in the last 72 hours. Hgb A1c No results for input(s): HGBA1C in the last 72 hours. Lipid Profile No results for input(s): CHOL, HDL, LDLCALC, TRIG, CHOLHDL, LDLDIRECT in the last 72 hours. Thyroid function studies No results for input(s): TSH, T4TOTAL, T3FREE, THYROIDAB in the last 72 hours.  Invalid input(s): FREET3 Anemia work up No  results for input(s): VITAMINB12, FOLATE, FERRITIN,  TIBC, IRON, RETICCTPCT in the last 72 hours. Urinalysis    Component Value Date/Time   COLORURINE YELLOW 01/06/2021 1957   APPEARANCEUR CLEAR 01/06/2021 1957   LABSPEC 1.012 01/06/2021 1957   PHURINE 5.0 01/06/2021 1957   GLUCOSEU NEGATIVE 01/06/2021 1957   GLUCOSEU NEGATIVE 04/11/2017 1536   HGBUR MODERATE (A) 01/06/2021 1957   BILIRUBINUR NEGATIVE 01/06/2021 1957   BILIRUBINUR neg 11/18/2016 1520   KETONESUR NEGATIVE 01/06/2021 1957   PROTEINUR NEGATIVE 01/06/2021 1957   UROBILINOGEN 0.2 04/11/2017 1536   NITRITE NEGATIVE 01/06/2021 1957   LEUKOCYTESUR LARGE (A) 01/06/2021 1957   Sepsis Labs Invalid input(s): PROCALCITONIN,  WBC,  LACTICIDVEN Microbiology Recent Results (from the past 240 hour(s))  Resp Panel by RT-PCR (Flu A&B, Covid) Nasopharyngeal Swab     Status: None   Collection Time: 01/06/21  9:47 AM   Specimen: Nasopharyngeal Swab; Nasopharyngeal(NP) swabs in vial transport medium  Result Value Ref Range Status   SARS Coronavirus 2 by RT PCR NEGATIVE NEGATIVE Final    Comment: (NOTE) SARS-CoV-2 target nucleic acids are NOT DETECTED.  The SARS-CoV-2 RNA is generally detectable in upper respiratory specimens during the acute phase of infection. The lowest concentration of SARS-CoV-2 viral copies this assay can detect is 138 copies/mL. A negative result does not preclude SARS-Cov-2 infection and should not be used as the sole basis for treatment or other patient management decisions. A negative result may occur with  improper specimen collection/handling, submission of specimen other than nasopharyngeal swab, presence of viral mutation(s) within the areas targeted by this assay, and inadequate number of viral copies(<138 copies/mL). A negative result must be combined with clinical observations, patient history, and epidemiological information. The expected result is Negative.  Fact Sheet for Patients:  EntrepreneurPulse.com.au  Fact Sheet for  Healthcare Providers:  IncredibleEmployment.be  This test is no t yet approved or cleared by the [place] FDA and  has been authorized for detection and/or diagnosis of SARS-CoV-2 by FDA under an Emergency Use Authorization (EUA). This EUA will remain  in effect (meaning this test can be used) for the duration of the COVID-19 declaration under Section 564(b)(1) of the Act, 21 U.S.C.section 360bbb-3(b)(1), unless the authorization is terminated  or revoked sooner.       Influenza A by PCR NEGATIVE NEGATIVE Final   Influenza B by PCR NEGATIVE NEGATIVE Final    Comment: (NOTE) The Xpert Xpress SARS-CoV-2/FLU/RSV plus assay is intended as an aid in the diagnosis of influenza from Nasopharyngeal swab specimens and should not be used as a sole basis for treatment. Nasal washings and aspirates are unacceptable for Xpert Xpress SARS-CoV-2/FLU/RSV testing.  Fact Sheet for Patients: EntrepreneurPulse.com.au  Fact Sheet for Healthcare Providers: IncredibleEmployment.be  This test is not yet approved or cleared by the [place] FDA and has been authorized for detection and/or diagnosis of SARS-CoV-2 by FDA under an Emergency Use Authorization (EUA). This EUA will remain in effect (meaning this test can be used) for the duration of the COVID-19 declaration under Section 564(b)(1) of the Act, 21 U.S.C. section 360bbb-3(b)(1), unless the authorization is terminated or revoked.  Performed at [facility], [place] [address] [place], [place] [address]   Urine Culture     Status: None   Collection Time: 01/06/21  7:57 PM   Specimen: Urine, Clean Catch  Result Value Ref Range Status   Specimen Description URINE, CLEAN CATCH  Final   Special Requests NONE  Final  Culture   Final    NO GROWTH Performed at [facility], [place] [address], [place], [place] [address]    Report Status 01/09/2021 FINAL  Final  Respiratory  (~20 pathogens) panel by PCR     Status: Abnormal   Collection Time: 01/06/21  8:18 PM   Specimen: Nasopharyngeal Swab; Respiratory  Result Value Ref Range Status   Adenovirus NOT DETECTED NOT DETECTED Final   Coronavirus 229E NOT DETECTED NOT DETECTED Final    Comment: (NOTE) The Coronavirus on the Respiratory Panel, DOES NOT test for the novel  Coronavirus (2019 nCoV)    Coronavirus HKU1 NOT DETECTED NOT DETECTED Final   Coronavirus NL63 NOT DETECTED NOT DETECTED Final   Coronavirus OC43 NOT DETECTED NOT DETECTED Final   Metapneumovirus NOT DETECTED NOT DETECTED Final   Rhinovirus / Enterovirus DETECTED (A) NOT DETECTED Final   Influenza A NOT DETECTED NOT DETECTED Final   Influenza B NOT DETECTED NOT DETECTED Final   Parainfluenza Virus 1 NOT DETECTED NOT DETECTED Final   Parainfluenza Virus 2 NOT DETECTED NOT DETECTED Final   Parainfluenza Virus 3 NOT DETECTED NOT DETECTED Final   Parainfluenza Virus 4 NOT DETECTED NOT DETECTED Final   Respiratory Syncytial Virus NOT DETECTED NOT DETECTED Final   Bordetella pertussis NOT DETECTED NOT DETECTED Final   Bordetella Parapertussis NOT DETECTED NOT DETECTED Final   Chlamydophila pneumoniae NOT DETECTED NOT DETECTED Final   Mycoplasma pneumoniae NOT DETECTED NOT DETECTED Final    Comment: Performed at [facility], [place]. [address], [place], [place] [address]  Resp Panel by RT-PCR (Flu A&B, Covid) Nasopharyngeal Swab     Status: None   Collection Time: 01/09/21 12:10 PM   Specimen: Nasopharyngeal Swab; Nasopharyngeal(NP) swabs in vial transport medium  Result Value Ref Range Status   SARS Coronavirus 2 by RT PCR NEGATIVE NEGATIVE Final    Comment: (NOTE) SARS-CoV-2 target nucleic acids are NOT DETECTED.  The SARS-CoV-2 RNA is generally detectable in upper respiratory specimens during the acute phase of infection. The lowest concentration of SARS-CoV-2 viral copies this assay can detect is 138 copies/mL. A negative result does  not preclude SARS-Cov-2 infection and should not be used as the sole basis for treatment or other patient management decisions. A negative result may occur with  improper specimen collection/handling, submission of specimen other than nasopharyngeal swab, presence of viral mutation(s) within the areas targeted by this assay, and inadequate number of viral copies(<138 copies/mL). A negative result must be combined with clinical observations, patient history, and epidemiological information. The expected result is Negative.  Fact Sheet for Patients:  EntrepreneurPulse.com.au  Fact Sheet for Healthcare Providers:  IncredibleEmployment.be  This test is no t yet approved or cleared by the [place] FDA and  has been authorized for detection and/or diagnosis of SARS-CoV-2 by FDA under an Emergency Use Authorization (EUA). This EUA will remain  in effect (meaning this test can be used) for the duration of the COVID-19 declaration under Section 564(b)(1) of the Act, 21 U.S.C.section 360bbb-3(b)(1), unless the authorization is terminated  or revoked sooner.       Influenza A by PCR NEGATIVE NEGATIVE Final   Influenza B by PCR NEGATIVE NEGATIVE Final    Comment: (NOTE) The Xpert Xpress SARS-CoV-2/FLU/RSV plus assay is intended as an aid in the diagnosis of influenza from Nasopharyngeal swab specimens and should not be used as a sole basis for treatment. Nasal washings and aspirates are unacceptable for Xpert Xpress SARS-CoV-2/FLU/RSV testing.  Fact Sheet for Patients: EntrepreneurPulse.com.au  Fact Sheet for Healthcare Providers: IncredibleEmployment.be  This test is not yet approved or cleared by the [place] FDA and has been authorized for detection and/or diagnosis of SARS-CoV-2 by FDA under an Emergency Use Authorization (EUA). This EUA will remain in effect (meaning this test can be used) for the  duration of the COVID-19 declaration under Section 564(b)(1) of the Act, 21 U.S.C. section 360bbb-3(b)(1), unless the authorization is terminated or revoked.  Performed at [facility], [place] [address], [place], [place] [address]      Time coordinating discharge: Over 30 minutes  SIGNED:   Darliss Cheney, MD  [facility] 01/10/2021, 7:50 AM  If 7PM-7AM, please contact night-coverage [email or web address]

## 2021-01-10 NOTE — Care Plan (Signed)
After all the discharge paper work was completed, I was informed by Education officer, museum that the facility does not have a bed for her anymore.  Discharge is going to be canceled today.  She needs to go to SNF.  TOC working on other facilities.

## 2021-01-10 NOTE — TOC Progression Note (Signed)
eransition of Care [facility]) - Progression Note    Patient Details  Name: Cathy [name] MRN: [id number] Date of Birth: 05/09/1927  Transition of Care [facility]) CM/SW [place], LCSW Phone Number:[phone] 01/10/2021, 11:27 AM  Clinical Narrative:    CSW spoke with pt's Daughter in law Kaser and updated her about pt's dc plan. CSW explained that [facility] is unable to offer pt a bed today and most likely it will be Monday.  [name] inquired about [employer] if the [employer] is being delayed. CSW informed [name] that it could be followed up on Monday.  Authorization was started by [facility].  TOC team will continue to assist with discharge planning needs.   Expected Discharge Plan: [place] Barriers to Discharge: Continued Medical Work up  Expected Discharge Plan and Services Expected Discharge Plan: [place] In-house Referral: Clinical Social Work Discharge Planning Services: CM Consult Post Acute Care Choice: [place] arrangements for the past 2 months: Single Family Home Expected Discharge Date: 01/10/21                                     Social Determinants of Health (SDOH) Interventions    Readmission Risk Interventions No flowsheet data found.

## 2021-01-11 DIAGNOSIS — J189 Pneumonia, unspecified organism: Secondary | ICD-10-CM | POA: Diagnosis not present

## 2021-01-11 MED ORDER — GUAIFENESIN-DM 100-10 MG/5ML PO SYRP
5.0000 mL | ORAL_SOLUTION | ORAL | Status: DC | PRN
  Administered 2021-01-11 – 2021-01-12 (×7): 5 mL via ORAL
  Filled 2021-01-11 (×7): qty 5

## 2021-01-11 MED ORDER — SODIUM CHLORIDE 0.9 % IV SOLN: INTRAVENOUS | Status: DC | PRN

## 2021-01-11 NOTE — TOC Progression Note (Addendum)
Transition of Care [facility]) - Progression Note    Patient Details  Name: Cathy [name] MRN: [id number] Date of Birth: 03-11-1927  Transition of Care [facility]) CM/SW [place], LCSW Phone Number:[phone] 01/11/2021, 2:46 PM  Clinical Narrative:     CSW was alerted by RN that family wanted to speak with CSW. CSW met with family and pt and they stressed that since pt could not go to [facility] that they wanted pt to go to [facility]. CSW explained that she was aware of their request and that Symone the weekday CSW would follow up when she came in tomorrow.  Pt authorization has been approved however if new facility is found it must be changed with [employer].  Everlene Balls #- [id number] Health Plan #- [id number] Authorization dates: 11/13-11/16/2022 Fax Number: [phone]    TOC team will continue to assist with discharge planning needs.    Expected Discharge Plan: [place] Barriers to Discharge: Continued Medical Work up  Expected Discharge Plan and Services Expected Discharge Plan: [place] In-house Referral: Clinical Social Work Discharge Planning Services: CM Consult Post Acute Care Choice: [place] arrangements for the past 2 months: Single Family Home Expected Discharge Date: 01/10/21                                     Social Determinants of Health (SDOH) Interventions    Readmission Risk Interventions No flowsheet data found.

## 2021-01-11 NOTE — Progress Notes (Signed)
PROGRESS NOTE    Cathy [name]  JGG:[id number] DOB: 01-25-1928 DOA: 01/06/2021 PCP: Biagio Borg, MD   Brief Narrative:  Cathy [name] is a 87 y.o. female with medical history significant of HTN, HLD, HFpEF, paroxysmal atrial flutter, hiatal hernia, and osteoarthritis who presented with complaints of cough and shortness of breath for approximately 2 days associated with diffuse body aches. Noted also having sore throat, fever, chills, congestion, intermittently productive cough, mild substernal chest discomfort, and dysuria.  She had fallen and hit her head.   Upon admission to the emergency department patient was seen to be afebrile, suppressions 18-23, blood pressure 93/47-126/66, and O2 saturations noted to be as low as 90% on room air placed on 2-3 L nasal cannula oxygen.  Chest x-ray noted large hiatal hernia with mild left pleural effusion with associated atelectasis versus infiltrate.  CT scan of the head noted no acute cranial abnormality and improvement in right parietal scalp hematoma.  Patient was given Rocephin, Zithromax, DuoNeb and some fluids and was admitted to hospital service.  Assessment & Plan:   Principal Problem:   Community acquired pneumonia Active Problems:   Diaphragmatic hernia   Atrial fibrillation and flutter (HCC)   Coronary artery disease involving native coronary artery of native heart without angina pectoris   Heart failure with preserved ejection fraction (HCC)   Hyponatremia   Acute respiratory failure with hypoxia (HCC)   Severe sepsis (HCC)   Malnutrition of moderate degree  Acute hypoxic respiratory failure/severe sepsis secondary to suspected community-acquired pneumonia/rhinovirus infection, POA: Patient met severe sepsis criteria upon admission based on tachypnea, leukocytosis, acute hypoxic respiratory failure and pneumonia.  Reported to have close contact with family members with influenza.  Influenza and COVID-19 screening were negative but she  was positive for rhinovirus. Chest x-ray noted mild left pleural effusion with associated left basilar atelectasis or infiltrate.  She received Rocephin and azithromycin.  Urine antigen for streptococci and legionella negative.  Afebrile.  Leukocytosis improving.  Patient clinically improving and now on 2 to 3 L of oxygen.  Repeat chest x-ray 01/09/2021 shows increasing hazy opacity at right lower lung, possible atelectasis.  I have been encouraging patient to use incentive spirometry but she tends to forget due to her dementia.  Now that she has no bed available until tomorrow, we will continue current antibiotics here and she will not need any further oral antibiotics at discharge.   Heart failure with preserved EF: Based on my chart review and personally examining the patient, I do not think that patient had acute component of congestive heart failure.  She has chronic diastolic congestive heart failure and she appears euvolemic.  Her BNP although elevated but is very close to her baseline and chest x-ray did not reveal any Signs of pulmonary edema.  Continue home dose of Lasix.    Permanent atrial fibrillation: Rates controlled. Continue amiodarone and Eliquis   History of CAD/chest pain/elevated troponin: Patient did report having some mild substernal chest discomfort.  High-sensitivity troponin 23 with repeat 27.  Thought to be relatively flat which suspect possibly related to demand.  CKD stage IIIa: Stable   Normocytic anemia: Stable.   Hyponatremia: Labs indicate possible SIADH in the face of pneumonia.  However sodium improved to 132 today.  We will just monitor.  Hypokalemia: 3.4.  Will replace.   Fall at home: Seen by PT OT, they recommend SNF.  Per TOC, SNF will have a bed for her tomorrow.  Chronic thrombocytopenia: Stable.  No signs of bleeding.   Hypothyroidism: Continue Synthroid.  DVT prophylaxis: apixaban (ELIQUIS) tablet 2.5 mg Start: 01/06/21 2200 SCDs Start: 01/06/21  1312   Code Status: Full Code  Family Communication: None at bedside.  Status is: Inpatient  Remains inpatient appropriate because: No SNF bed.  Estimated body mass index is 25.72 kg/m as calculated from the following:   Height as of this encounter: _0  (1.397 m).   Weight as of this encounter: 50.2 kg.     Nutritional Assessment: Body mass index is 25.72 kg/m.[name] Seen by dietician.  I agree with the assessment and plan as outlined below: Nutrition Status:  Skin Assessment: I have examined the patient's skin and I agree with the wound assessment as performed by the wound care RN as outlined below:    Consultants:  None  Procedures:  None  Antimicrobials:  Anti-infectives (From admission, onward)    Start     Dose/Rate Route Frequency Ordered Stop   01/10/21 0000  cefdinir (OMNICEF) 300 MG capsule        300 mg Oral 2 times daily 01/10/21 0750 01/13/21 2359   01/07/21 1000  doxycycline (VIBRAMYCIN) 100 mg in sodium chloride 0.9 % 250 mL IVPB        100 mg 125 mL/hr over 120 Minutes Intravenous Every 12 hours 01/06/21 1316     01/06/21 1215  cefTRIAXone (ROCEPHIN) 1 g in sodium chloride 0.9 % 100 mL IVPB        1 g 200 mL/hr over 30 Minutes Intravenous  Once 01/06/21 1208 01/06/21 1250   01/06/21 1215  azithromycin (ZITHROMAX) 500 mg in sodium chloride 0.9 % 250 mL IVPB  Status:  Discontinued        500 mg 250 mL/hr over 60 Minutes Intravenous  Once 01/06/21 1208 01/06/21 1428   01/06/21 1200  cefTRIAXone (ROCEPHIN) 2 g in sodium chloride 0.9 % 100 mL IVPB        2 g 200 mL/hr over 30 Minutes Intravenous Every 24 hours 01/06/21 1316 01/11/21 1159          Subjective:  Patient seen and examined.  She has no complaints.  Objective: Vitals:   01/10/21 1932 01/11/21 0300 01/11/21 0454 01/11/21 0806  BP: 130/70  121/66 138/86  Pulse: (!) 52  (!) 53 73  Resp: _1 Temp: 98.3 F (36.8 C)  98.6 F (37 C)   TempSrc: Oral  Oral   SpO2: 95%  95% 97%   Weight:  50.2 kg    Height:        Intake/Output Summary (Last 24 hours) at 01/11/2021 0941 Last data filed at 01/11/2021 0901 Gross per 24 hour  Intake 1448.7 ml  Output 725 ml  Net 723.7 ml    Filed Weights   01/09/21 0400 01/10/21 0048 01/11/21 0300  Weight: 52.1 kg 49.9 kg 50.2 kg    Examination:  General exam: Appears calm and comfortable, cachectic Respiratory system: Diminished breath sounds bilaterally at the bases, respiratory effort normal. Cardiovascular system: S1 & S2 heard, RRR. No JVD, murmurs, rubs, gallops or clicks. No pedal edema. Gastrointestinal system: Abdomen is nondistended, soft and nontender. No organomegaly or masses felt. Normal bowel sounds heard. Central nervous system: Alert and oriented x2. No focal neurological deficits. Extremities: Symmetric 5 x 5 power. Skin: No rashes, lesions or ulcers.  Psychiatry: Judgement and insight appear poor   Data Reviewed: I have personally reviewed following labs and imaging studies  CBC: Recent  Labs  Lab 01/06/21 1025 01/06/21 2215 01/07/21 0324 01/08/21 0338 01/09/21 0439  WBC 18.8*  --  12.4* 17.5* 15.4*  NEUTROABS 16.5*  --   --  15.3* 13.2*  HGB 9.6* 9.0* 10.3* 9.5* 10.6*  HCT 29.3* 27.6* 30.8* 28.8* 31.4*  MCV 92.1  --  91.7 92.0 90.8  PLT 148*  --  144* 193 888    Basic Metabolic Panel: Recent Labs  Lab 01/06/21 1025 01/07/21 0324 01/08/21 0338 01/09/21 0439  NA 130* 132* 129* 132*  K 4.6 4.0 3.5 3.4*  CL 97* 96* 96* 98  CO2 21* _0 GLUCOSE 88 123* 103* 90  BUN 17 15 25* 21  CREATININE 1.14* 1.05* 0.98 0.96  CALCIUM 8.5* 8.5* 8.2* 8.3*  MG  --  2.0 1.9  --     GFR: Estimated Creatinine Clearance: 24.4 mL/min (by C-G formula based on SCr of 0.96 mg/dL). Liver Function Tests: No results for input(s): AST, ALT, ALKPHOS, BILITOT, PROT, ALBUMIN in the last 168 hours. No results for input(s): LIPASE, AMYLASE in the last 168 hours. No results for input(s): AMMONIA in the  last 168 hours. Coagulation Profile: No results for input(s): INR, PROTIME in the last 168 hours. Cardiac Enzymes: No results for input(s): CKTOTAL, CKMB, CKMBINDEX, TROPONINI in the last 168 hours. BNP (last 3 results) No results for input(s): PROBNP in the last 8760 hours. HbA1C: No results for input(s): HGBA1C in the last 72 hours. CBG: No results for input(s): GLUCAP in the last 168 hours. Lipid Profile: No results for input(s): CHOL, HDL, LDLCALC, TRIG, CHOLHDL, LDLDIRECT in the last 72 hours. Thyroid Function Tests: No results for input(s): TSH, T4TOTAL, FREET4, T3FREE, THYROIDAB in the last 72 hours.  Anemia Panel: No results for input(s): VITAMINB12, FOLATE, FERRITIN, TIBC, IRON, RETICCTPCT in the last 72 hours. Sepsis Labs: Recent Labs  Lab 01/06/21 1025 01/06/21 1546  PROCALCITON  --  0.27  LATICACIDVEN 1.9  --      Recent Results (from the past 240 hour(s))  Resp Panel by RT-PCR (Flu A&B, Covid) Nasopharyngeal Swab     Status: None   Collection Time: 01/06/21  9:47 AM   Specimen: Nasopharyngeal Swab; Nasopharyngeal(NP) swabs in vial transport medium  Result Value Ref Range Status   SARS Coronavirus 2 by RT PCR NEGATIVE NEGATIVE Final    Comment: (NOTE) SARS-CoV-2 target nucleic acids are NOT DETECTED.  The SARS-CoV-2 RNA is generally detectable in upper respiratory specimens during the acute phase of infection. The lowest concentration of SARS-CoV-2 viral copies this assay can detect is 138 copies/mL. A negative result does not preclude SARS-Cov-2 infection and should not be used as the sole basis for treatment or other patient management decisions. A negative result may occur with  improper specimen collection/handling, submission of specimen other than nasopharyngeal swab, presence of viral mutation(s) within the areas targeted by this assay, and inadequate number of viral copies(<138 copies/mL). A negative result must be combined with clinical  observations, patient history, and epidemiological information. The expected result is Negative.  Fact Sheet for Patients:  EntrepreneurPulse.com.au  Fact Sheet for Healthcare Providers:  IncredibleEmployment.be  This test is no t yet approved or cleared by the [place] FDA and  has been authorized for detection and/or diagnosis of SARS-CoV-2 by FDA under an Emergency Use Authorization (EUA). This EUA will remain  in effect (meaning this test can be used) for the duration of the COVID-19 declaration under Section 564(b)(1) of the Act, 21 U.S.C.section 360bbb-3(b)(1),  unless the authorization is terminated  or revoked sooner.       Influenza A by PCR NEGATIVE NEGATIVE Final   Influenza B by PCR NEGATIVE NEGATIVE Final    Comment: (NOTE) The Xpert Xpress SARS-CoV-2/FLU/RSV plus assay is intended as an aid in the diagnosis of influenza from Nasopharyngeal swab specimens and should not be used as a sole basis for treatment. Nasal washings and aspirates are unacceptable for Xpert Xpress SARS-CoV-2/FLU/RSV testing.  Fact Sheet for Patients: EntrepreneurPulse.com.au  Fact Sheet for Healthcare Providers: IncredibleEmployment.be  This test is not yet approved or cleared by the [place] FDA and has been authorized for detection and/or diagnosis of SARS-CoV-2 by FDA under an Emergency Use Authorization (EUA). This EUA will remain in effect (meaning this test can be used) for the duration of the COVID-19 declaration under Section 564(b)(1) of the Act, 21 U.S.C. section 360bbb-3(b)(1), unless the authorization is terminated or revoked.  Performed at [facility], [place] [address], [place], [place] [address]   Urine Culture     Status: None   Collection Time: 01/06/21  7:57 PM   Specimen: Urine, Clean Catch  Result Value Ref Range Status   Specimen Description URINE, CLEAN CATCH  Final   Special  Requests NONE  Final   Culture   Final    NO GROWTH Performed at [facility], [place] [address], [place], [place] [address]    Report Status 01/09/2021 FINAL  Final  Respiratory (~20 pathogens) panel by PCR     Status: Abnormal   Collection Time: 01/06/21  8:18 PM   Specimen: Nasopharyngeal Swab; Respiratory  Result Value Ref Range Status   Adenovirus NOT DETECTED NOT DETECTED Final   Coronavirus 229E NOT DETECTED NOT DETECTED Final    Comment: (NOTE) The Coronavirus on the Respiratory Panel, DOES NOT test for the novel  Coronavirus (2019 nCoV)    Coronavirus HKU1 NOT DETECTED NOT DETECTED Final   Coronavirus NL63 NOT DETECTED NOT DETECTED Final   Coronavirus OC43 NOT DETECTED NOT DETECTED Final   Metapneumovirus NOT DETECTED NOT DETECTED Final   Rhinovirus / Enterovirus DETECTED (A) NOT DETECTED Final   Influenza A NOT DETECTED NOT DETECTED Final   Influenza B NOT DETECTED NOT DETECTED Final   Parainfluenza Virus 1 NOT DETECTED NOT DETECTED Final   Parainfluenza Virus 2 NOT DETECTED NOT DETECTED Final   Parainfluenza Virus 3 NOT DETECTED NOT DETECTED Final   Parainfluenza Virus 4 NOT DETECTED NOT DETECTED Final   Respiratory Syncytial Virus NOT DETECTED NOT DETECTED Final   Bordetella pertussis NOT DETECTED NOT DETECTED Final   Bordetella Parapertussis NOT DETECTED NOT DETECTED Final   Chlamydophila pneumoniae NOT DETECTED NOT DETECTED Final   Mycoplasma pneumoniae NOT DETECTED NOT DETECTED Final    Comment: Performed at [facility], [place]. [address], [place],  [address]  Resp Panel by RT-PCR (Flu A&B, Covid) Nasopharyngeal Swab     Status: None   Collection Time: 01/09/21 12:10 PM   Specimen: Nasopharyngeal Swab; Nasopharyngeal(NP) swabs in vial transport medium  Result Value Ref Range Status   SARS Coronavirus 2 by RT PCR NEGATIVE NEGATIVE Final    Comment: (NOTE) SARS-CoV-2 target nucleic acids are NOT DETECTED.  The SARS-CoV-2 RNA is generally detectable  in upper respiratory specimens during the acute phase of infection. The lowest concentration of SARS-CoV-2 viral copies this assay can detect is 138 copies/mL. A negative result does not preclude SARS-Cov-2 infection and should not be used as  the sole basis for treatment or other patient management decisions. A negative result may occur with  improper specimen collection/handling, submission of specimen other than nasopharyngeal swab, presence of viral mutation(s) within the areas targeted by this assay, and inadequate number of viral copies(<138 copies/mL). A negative result must be combined with clinical observations, patient history, and epidemiological information. The expected result is Negative.  Fact Sheet for Patients:  EntrepreneurPulse.com.au  Fact Sheet for Healthcare Providers:  IncredibleEmployment.be  This test is no t yet approved or cleared by the [place] FDA and  has been authorized for detection and/or diagnosis of SARS-CoV-2 by FDA under an Emergency Use Authorization (EUA). This EUA will remain  in effect (meaning this test can be used) for the duration of the COVID-19 declaration under Section 564(b)(1) of the Act, 21 U.S.C.section 360bbb-3(b)(1), unless the authorization is terminated  or revoked sooner.       Influenza A by PCR NEGATIVE NEGATIVE Final   Influenza B by PCR NEGATIVE NEGATIVE Final    Comment: (NOTE) The Xpert Xpress SARS-CoV-2/FLU/RSV plus assay is intended as an aid in the diagnosis of influenza from Nasopharyngeal swab specimens and should not be used as a sole basis for treatment. Nasal washings and aspirates are unacceptable for Xpert Xpress SARS-CoV-2/FLU/RSV testing.  Fact Sheet for Patients: EntrepreneurPulse.com.au  Fact Sheet for Healthcare Providers: IncredibleEmployment.be  This test is not yet approved or cleared by the [place] FDA and has  been authorized for detection and/or diagnosis of SARS-CoV-2 by FDA under an Emergency Use Authorization (EUA). This EUA will remain in effect (meaning this test can be used) for the duration of the COVID-19 declaration under Section 564(b)(1) of the Act, 21 U.S.C. section 360bbb-3(b)(1), unless the authorization is terminated or revoked.  Performed at [facility], [place] [address] [place], [place] [address]        Radiology Studies: DG CHEST PORT 1 VIEW  Result Date: 01/09/2021 CLINICAL DATA:  CHF EXAM: PORTABLE CHEST 1 VIEW COMPARISON:  Chest x-ray 01/06/2021 FINDINGS: Heart is enlarged. Mediastinum appears grossly stable. Calcified plaques in the thoracic aorta. Large hiatal hernia containing bowel. Interval increasing hazy opacities in the right lower lung zone. Stable small to moderate left pleural effusion. No pneumothorax. IMPRESSION: 1. Cardiomegaly. 2. Increasing hazy opacities at the right lower lung zone likely represents layering pleural effusion with atelectasis. 3. Stable small to moderate left pleural effusion. 4. Large hiatal hernia. Electronically Signed   By: Ofilia Neas M.D.   On: 01/09/2021 13:05    Scheduled Meds:  acetaminophen  650 mg Oral Q8H   amiodarone  200 mg Oral Daily   apixaban  2.5 mg Oral BID   atorvastatin  20 mg Oral q1800   bacitracin  1 application Topical BID   darifenacin  15 mg Oral Daily   feeding supplement  237 mL Oral BID BM   guaiFENesin  600 mg Oral BID   isosorbide mononitrate  60 mg Oral Daily   levothyroxine  62.5 mcg Oral QAC breakfast   multivitamin with minerals  1 tablet Oral Daily   saccharomyces boulardii  250 mg Oral BID   sodium chloride flush  3 mL Intravenous Q12H   vitamin B-12  1,000 mcg Oral Daily   Continuous Infusions:  cefTRIAXone (ROCEPHIN)  IV Stopped (01/10/21 2133)   doxycycline (VIBRAMYCIN) IV 125 mL/hr at 01/11/21 0936     LOS: 5 days   Time spent: 25 minutes   Darliss Cheney, MD [facility]  01/11/2021, 9:41 AM  Please page via [place] and do not message via secure chat for anything urgent. Secure chat can be used for anything non urgent.  How to contact the [facility] Attending or Consulting provider [place] or covering provider during after hours [place], for this patient?  Check the care team in [facility] and look for a) attending/consulting [facility] provider listed and b) the [facility] team listed. Page or secure chat [phone]. Log into [email or web address] and use [place]'s universal password to access. If you do not have the password, please contact the hospital operator. Locate the [facility] provider you are looking for under [facility] and page to a number that you can be directly reached. If you still have difficulty reaching the provider, please page the [facility] (Director on Call) for the Hospitalists listed on amion for assistance.

## 2021-01-12 DIAGNOSIS — J189 Pneumonia, unspecified organism: Secondary | ICD-10-CM | POA: Diagnosis not present

## 2021-01-12 LAB — BASIC METABOLIC PANEL
Anion gap: 6 (ref 5–15)
BUN: 11 mg/dL (ref 8–23)
CO2: 25 mmol/L (ref 22–32)
Calcium: 8.4 mg/dL — ABNORMAL LOW (ref 8.9–10.3)
Chloride: 100 mmol/L (ref 98–111)
Creatinine, Ser: 0.78 mg/dL (ref 0.44–1.00)
GFR, Estimated: >60 mL/min (ref >60)
Glucose, Bld: 93 mg/dL (ref 70–99)
Potassium: 4.1 mmol/L (ref 3.5–5.1)
Sodium: 131 mmol/L — ABNORMAL LOW (ref 135–145)

## 2021-01-12 LAB — RESP PANEL BY RT-PCR (FLU A&B, COVID) ARPGX2
Influenza A by PCR: NEGATIVE
Influenza B by PCR: NEGATIVE
SARS Coronavirus 2 by RT PCR: NEGATIVE

## 2021-01-12 LAB — MAGNESIUM: Magnesium: 1.9 mg/dL (ref 1.7–2.4)

## 2021-01-12 NOTE — Discharge Summary (Signed)
Physician Discharge Summary  Cathy [name] GBT:[id number] DOB: Oct 15, 1927 DOA: 01/06/2021  PCP: Biagio Borg, MD  Admit date: 01/06/2021 Discharge date: 01/12/2021 , of note, patient was initially supposed to be discharged on 01/10/2021 however after discharge summary was completed, I was informed by the social worker that the facility declined the bed until 01/12/2021.  Patient stayed in the hospital until today, now she is being discharged to SNF. 30 Day Unplanned Readmission Risk Score    Flowsheet Row ED to Hosp-Admission (Current) from 01/06/2021 in [facility]  30 Day Unplanned Readmission Risk Score (%) 18.83 Filed at 01/10/2021 0400       This score is the patient's risk of an unplanned readmission within 30 days of being discharged (0 -100%). The score is based on dignosis, age, lab data, medications, orders, and past utilization.   Low:  0-14.9   Medium: 15-21.9   High: 22-29.9   Extreme: 30 and above          Admitted From: Home Disposition: SNF  Recommendations for Outpatient Follow-up:  Follow up with PCP in 1-2 weeks Please obtain BMP/CBC in one week Please follow up with your PCP on the following pending results: Unresulted Labs (From admission, onward)    None         Home Health: None Equipment/Devices: None  Discharge Condition: Stable CODE STATUS: Full code Diet recommendation: Cardiac  Subjective: Seen and examined.  She has no complaints.  She is fully alert and oriented.  Brief/Interim Summary: Cathy [name] is a 87 y.o. female with medical history significant of HTN, HLD, HFpEF, paroxysmal atrial flutter, hiatal hernia, and osteoarthritis who presented with complaints of cough and shortness of breath for approximately 2 days associated with diffuse body aches. Noted also having sore throat, fever, chills, congestion, intermittently productive cough, mild substernal chest discomfort, and dysuria.  She had fallen and hit her head.     Upon admission to the emergency department patient was seen to be afebrile, suppressions 18-23, blood pressure 93/47-126/66, and O2 saturations noted to be as low as 90% on room air placed on 2-3 L nasal cannula oxygen.  Chest x-ray noted large hiatal hernia with mild left pleural effusion with associated atelectasis versus infiltrate.  CT scan of the head noted no acute cranial abnormality and improvement in right parietal scalp hematoma.  Patient was given Rocephin, Zithromax, DuoNeb and some fluids and was admitted to hospital service.  Further hospitalization course as below.  Acute hypoxic respiratory failure/severe sepsis secondary to suspected community-acquired pneumonia/rhinovirus infection, POA: Patient met severe sepsis criteria upon admission based on tachypnea, leukocytosis, acute hypoxic respiratory failure and pneumonia. Influenza and COVID-19 screening were negative but she was positive for rhinovirus. Urine antigen for streptococci and legionella negative.  Afebrile.  Leukocytosis improving.  Patient has improved significantly, currently only on 1 L of oxygen.  No shortness of breath.  She has received 6 days of antibiotics.  No further oral antibiotics needed.    Heart failure with preserved EF: Patient did not have any acute exacerbation.  Her home dose of Lasix was continued here.  Permanent atrial fibrillation: Rates controlled. Continue amiodarone and Eliquis   History of CAD/chest pain/elevated troponin: Patient did report having some mild substernal chest discomfort.  High-sensitivity troponin 23 with repeat 27.  Thought to be relatively flat which suspect possibly related to demand.   CKD stage IIIa: Stable   Normocytic anemia: Stable.   Hyponatremia: Labs indicate possible  SIADH in the face of pneumonia.  However sodium improved to 132.   Hypokalemia: Resolved   Fall at home: Seen by PT OT, they recommend SNF.  Patient is being discharged to SNF today.   Chronic  thrombocytopenia: Stable.  No signs of bleeding.   Hypothyroidism: Continue Synthroid.  Discharge Diagnoses:  Principal Problem:   Community acquired pneumonia Active Problems:   Diaphragmatic hernia   Atrial fibrillation and flutter (HCC)   Coronary artery disease involving native coronary artery of native heart without angina pectoris   Heart failure with preserved ejection fraction (HCC)   Hyponatremia   Acute respiratory failure with hypoxia (HCC)   Severe sepsis (HCC)   Malnutrition of moderate degree    Discharge Instructions   Allergies as of 01/12/2021       Reactions   Amoxicillin-pot Clavulanate Diarrhea   Has patient had a PCN reaction causing immediate rash, facial/tongue/throat swelling, SOB or lightheadedness with hypotension: no Has patient had a PCN reaction causing severe rash involving mucus membranes or skin necrosis:  no Has patient had a PCN reaction that required hospitalization: no Has patient had a PCN reaction occurring within the last 10 years no If all of the above answers are "NO", then may proceed with Cephalosporin use.   Penicillins Diarrhea   Sulfonamide Derivatives Nausea Only        Medication List     TAKE these medications    acetaminophen 325 MG tablet Commonly known as: TYLENOL Take 650 mg by mouth every 8 (eight) hours.   amiodarone 200 MG tablet Commonly known as: PACERONE TAKE 1 TABLET DAILY   apixaban 2.5 MG Tabs tablet Commonly known as: Eliquis Take 1 tablet (2.5 mg total) by mouth 2 (two) times daily. Overdue for labwork, MUST have labwork at upcoming OV with MD for FUTURE refills.   atorvastatin 20 MG tablet Commonly known as: LIPITOR TAKE 1 TABLET DAILY AT 6:00IN THE EVENING What changed:  how much to take how to take this when to take this additional instructions   bacitracin ointment Apply 1 application topically 2 (two) times daily.   calcium-vitamin D 500-200 MG-UNIT tablet Commonly known as: OSCAL  WITH D Take 1 tablet by mouth daily.   Delsym Cough/Cold Daytime 5-10-200-325 MG/10ML Liqd Generic drug: Phenylephrine-DM-GG-APAP Take 10 mLs by mouth every 12 (twelve) hours as needed (cough).   dextromethorphan-guaiFENesin 30-600 MG 12hr tablet Commonly known as: MUCINEX DM Take 1 tablet by mouth 2 (two) times daily.   esomeprazole 40 MG capsule Commonly known as: NEXIUM Take 1 capsule (40 mg total) by mouth daily at 12 noon.   furosemide 20 MG tablet Commonly known as: LASIX Take 1 tablet (20 mg total) by mouth every other day. What changed: when to take this   ibuprofen 100 MG tablet Commonly known as: ADVIL Take 200 mg by mouth every 6 (six) hours as needed for fever.   isosorbide mononitrate 60 MG 24 hr tablet Commonly known as: IMDUR TAKE 1 TABLET DAILY   polyethylene glycol 17 g packet Commonly known as: MIRALAX / GLYCOLAX Take 17 g by mouth daily.   solifenacin 10 MG tablet Commonly known as: VESICARE Take 1 tablet (10 mg total) by mouth daily.   Synthroid 125 MCG tablet Generic drug: levothyroxine TAKE 1/2 TABLET DAILY. What changed:  how much to take how to take this when to take this   Transport Chair Misc Use as directed three times daily   vitamin B-12 1000 MCG tablet Commonly  known as: CYANOCOBALAMIN Take 1 tablet (1,000 mcg total) by mouth daily.               Durable Medical Equipment  (From admission, onward)           Start     Ordered   01/08/21 1425  For home use only DME oxygen  Once       Question Answer Comment  Length of Need Lifetime   Mode or (Route) Nasal cannula   Liters per Minute 3   Frequency Continuous (stationary and portable oxygen unit needed)   Oxygen conserving device Yes   Oxygen delivery system Gas      01/08/21 1425            Follow-up Information     Biagio Borg, MD Follow up in 1 week(s).   Specialties: Internal Medicine, Radiology Contact information: [place]  [place] [address] [phone]         Constance Haw, MD .   Specialty: Cardiology Contact information: [address] [place] [address] [phone]                Allergies  Allergen Reactions   Amoxicillin-Pot Clavulanate Diarrhea    Has patient had a PCN reaction causing immediate rash, facial/tongue/throat swelling, SOB or lightheadedness with hypotension: no Has patient had a PCN reaction causing severe rash involving mucus membranes or skin necrosis:  no Has patient had a PCN reaction that required hospitalization: no Has patient had a PCN reaction occurring within the last 10 years no If all of the above answers are "NO", then may proceed with Cephalosporin use.    Penicillins Diarrhea   Sulfonamide Derivatives Nausea Only    Consultations: None   Procedures/Studies: DG Shoulder Right  Result Date: 12/28/2020 CLINICAL DATA:  [place].  Right shoulder pain. EXAM: RIGHT SHOULDER - 2+ VIEW COMPARISON:  None. FINDINGS: Advanced glenohumeral joint degenerative changes with joint space narrowing and spurring. Moderate AC joint degenerative changes. No acute fracture is identified. On the scapular Y-view the patient's humeral head is projecting posteriorly. This could be positional but recommend a axillary view to exclude a posterior dislocation The visualized right ribs are intact IMPRESSION: 1. Advanced glenohumeral joint degenerative changes but no definite acute fracture. 2. The patient's humeral head is projecting posteriorly in relation to the glenoid this could be projectional but recommend an axillary view to exclude a posterior dislocation. Electronically Signed   By: Marijo Sanes M.D.   On: 12/28/2020 15:48   DG Wrist 2 Views Right  Result Date: 12/28/2020 CLINICAL DATA:  [place].  Right wrist pain. EXAM: RIGHT WRIST - 2 VIEW COMPARISON:  11/19/2015 FINDINGS: Advanced and progressive degenerative changes involving the wrist and the carpometacarpal joint  of the thumb. No acute fracture is identified. IMPRESSION: 1. No acute wrist fracture. 2. Advanced and progressive degenerative changes. Electronically Signed   By: Marijo Sanes M.D.   On: 12/28/2020 15:44   CT Head Wo Contrast  Result Date: 01/06/2021 CLINICAL DATA:  Head trauma, minor (Age >= 65y) recent head trauma, fever, cough EXAM: CT HEAD WITHOUT CONTRAST TECHNIQUE: Contiguous axial images were obtained from the base of the skull through the vertex without intravenous contrast. COMPARISON:  Head CT 12/28/2020 FINDINGS: Brain: No evidence of acute intracranial hemorrhage or extra-axial collection. The ventricles are unchanged in size.Scattered subcortical and periventricular white matter hypodensities, nonspecific but likely sequela of chronic small vessel ischemic disease.Mild cerebral atrophy  Vascular: Stable vascular calcifications. Skull: Normal. Negative for fracture or focal lesion. Sinuses/Orbits: Mucosal thickening of the anterior ethmoid air cells. Small mucous retention cyst in the left maxillary sinus. The mastoid air cells are clear. Other: Slight improvement in the right high parietal scalp hematoma. IMPRESSION: No acute intracranial abnormality. Unchanged sequela of chronic small vessel ischemic disease and cerebral atrophy. Slight improvement in the right high parietal scalp hematoma. Electronically Signed   By: Maurine Simmering M.D.   On: 01/06/2021 12:03   CT HEAD WO CONTRAST  Result Date: 12/28/2020 CLINICAL DATA:  [place].  Hit head. EXAM: CT HEAD WITHOUT CONTRAST CT CERVICAL SPINE WITHOUT CONTRAST TECHNIQUE: Multidetector CT imaging of the head and cervical spine was performed following the standard protocol without intravenous contrast. Multiplanar CT image reconstructions of the cervical spine were also generated. COMPARISON:  Head CT 03/01/2018 FINDINGS: CT HEAD FINDINGS Brain: Stable age related cerebral atrophy, ventriculomegaly and periventricular white matter disease. No  extra-axial fluid collections are identified. No CT findings for acute hemispheric infarction or intracranial hemorrhage. No mass lesions. The brainstem and cerebellum are normal. Vascular: Stable vascular calcifications. No aneurysm or hyperdense vessels. Skull: No skull fracture or bone lesions. Sinuses/Orbits: The paranasal sinuses and mastoid air cells are clear. The globes are intact. Other: Right high parietal scalp hematoma without underlying fracture. CT CERVICAL SPINE FINDINGS Alignment: Moderate degenerative anterior subluxation of C7 compared to T1 due to advanced facet disease. The other cervical vertebral bodies are grossly normally aligned. The facets are normally aligned. Skull base and vertebrae: Age related degenerative changes but no acute cervical spine fracture. Soft tissues and spinal canal: No prevertebral fluid or swelling. No visible canal hematoma. Disc levels: The spinal canal is fairly generous. No significant spinal stenosis. Mild multilevel foraminal stenosis due to uncinate spurring and facet disease. Upper chest: The lung apices are grossly clear. Other: Bilateral carotid artery calcifications. IMPRESSION: 1. Stable age related cerebral atrophy, ventriculomegaly and periventricular white matter disease. 2. No acute intracranial findings or skull fracture. 3. Right high parietal scalp hematoma without underlying fracture. 4. Degenerative cervical spondylosis with multilevel disc disease and facet disease but no acute cervical spine fracture. Electronically Signed   By: Marijo Sanes M.D.   On: 12/28/2020 15:25   CT CERVICAL SPINE WO CONTRAST  Result Date: 12/28/2020 CLINICAL DATA:  [place].  Hit head. EXAM: CT HEAD WITHOUT CONTRAST CT CERVICAL SPINE WITHOUT CONTRAST TECHNIQUE: Multidetector CT imaging of the head and cervical spine was performed following the standard protocol without intravenous contrast. Multiplanar CT image reconstructions of the cervical spine were also generated.  COMPARISON:  Head CT 03/01/2018 FINDINGS: CT HEAD FINDINGS Brain: Stable age related cerebral atrophy, ventriculomegaly and periventricular white matter disease. No extra-axial fluid collections are identified. No CT findings for acute hemispheric infarction or intracranial hemorrhage. No mass lesions. The brainstem and cerebellum are normal. Vascular: Stable vascular calcifications. No aneurysm or hyperdense vessels. Skull: No skull fracture or bone lesions. Sinuses/Orbits: The paranasal sinuses and mastoid air cells are clear. The globes are intact. Other: Right high parietal scalp hematoma without underlying fracture. CT CERVICAL SPINE FINDINGS Alignment: Moderate degenerative anterior subluxation of C7 compared to T1 due to advanced facet disease. The other cervical vertebral bodies are grossly normally aligned. The facets are normally aligned. Skull base and vertebrae: Age related degenerative changes but no acute cervical spine fracture. Soft tissues and spinal canal: No prevertebral fluid or swelling. No visible canal hematoma. Disc levels: The spinal canal is fairly generous. No  significant spinal stenosis. Mild multilevel foraminal stenosis due to uncinate spurring and facet disease. Upper chest: The lung apices are grossly clear. Other: Bilateral carotid artery calcifications. IMPRESSION: 1. Stable age related cerebral atrophy, ventriculomegaly and periventricular white matter disease. 2. No acute intracranial findings or skull fracture. 3. Right high parietal scalp hematoma without underlying fracture. 4. Degenerative cervical spondylosis with multilevel disc disease and facet disease but no acute cervical spine fracture. Electronically Signed   By: Marijo Sanes M.D.   On: 12/28/2020 15:25   DG Pelvis Portable  Result Date: 12/28/2020 CLINICAL DATA:  Trauma, fall EXAM: PORTABLE PELVIS 1-2 VIEWS COMPARISON:  06/30/2019 FINDINGS: There is previous internal fixation in the proximal left femur. No  definite recent displaced fracture is seen. There is no dislocation. Marked degenerative changes are noted in the visualized lower lumbar spine. Osteopenia is seen in bony structures. Arterial calcifications are seen in soft tissues. IMPRESSION: Previous internal fixation in the left femoral neck. No recent displaced fracture or dislocation is seen. Lumbar spondylosis. Electronically Signed   By: Elmer Picker M.D.   On: 12/28/2020 15:07   DG Hand 2 View Right  Result Date: 12/28/2020 CLINICAL DATA:  [place]. EXAM: RIGHT HAND - 2 VIEW COMPARISON:  Radiographs from 2017 FINDINGS: Advanced and progressive degenerative changes involving the hand and wrist. No acute hand fractures are identified. IMPRESSION: 1. Advanced and progressive degenerative changes involving the hand and wrist. 2. No acute fracture. Electronically Signed   By: Marijo Sanes M.D.   On: 12/28/2020 15:45   DG CHEST PORT 1 VIEW  Result Date: 01/09/2021 CLINICAL DATA:  CHF EXAM: PORTABLE CHEST 1 VIEW COMPARISON:  Chest x-ray 01/06/2021 FINDINGS: Heart is enlarged. Mediastinum appears grossly stable. Calcified plaques in the thoracic aorta. Large hiatal hernia containing bowel. Interval increasing hazy opacities in the right lower lung zone. Stable small to moderate left pleural effusion. No pneumothorax. IMPRESSION: 1. Cardiomegaly. 2. Increasing hazy opacities at the right lower lung zone likely represents layering pleural effusion with atelectasis. 3. Stable small to moderate left pleural effusion. 4. Large hiatal hernia. Electronically Signed   By: Ofilia Neas M.D.   On: 01/09/2021 13:05   DG Chest Port 1 View  Result Date: 01/06/2021 CLINICAL DATA:  Shortness of breath, fever, cough. EXAM: PORTABLE CHEST 1 VIEW COMPARISON:  December 28, 2020. FINDINGS: Stable cardiomegaly. Large hiatal hernia. Right lung is clear. Mild left pleural effusion is noted with associated left basilar atelectasis or infiltrate. Bony thorax is  unremarkable. IMPRESSION: Large hiatal hernia. Mild left pleural effusion is noted with associated left basilar atelectasis or infiltrate. Electronically Signed   By: Marijo Conception M.D.   On: 01/06/2021 10:18   DG Chest Port 1 View  Result Date: 12/28/2020 CLINICAL DATA:  87 year old female with fall.  On blood thinners. EXAM: PORTABLE CHEST 1 VIEW COMPARISON:  06/28/2019 and prior radiographs FINDINGS: Cardiomegaly and very large hiatal hernia again noted. There is no evidence of focal airspace disease, pulmonary edema, suspicious pulmonary nodule/mass, pleural effusion, or pneumothorax. No acute bony abnormalities are identified. IMPRESSION: No evidence of acute abnormality. Cardiomegaly and very large hiatal hernia. Electronically Signed   By: Margarette Canada M.D.   On: 12/28/2020 15:05   DG Knee Complete 4 Views Right  Result Date: 12/28/2020 CLINICAL DATA:  [place].  Right knee pain. EXAM: RIGHT KNEE - COMPLETE 4+ VIEW COMPARISON:  Radiographs 11/19/2015 FINDINGS: The total knee arthroplasty components are well seated. No complicating features. No periprosthetic fracture. No joint  effusion. Vascular calcifications noted. IMPRESSION: 1. No acute bony findings or joint effusion. 2. Well seated components of a total knee arthroplasty. Electronically Signed   By: Marijo Sanes M.D.   On: 12/28/2020 15:49     Discharge Exam: Vitals:   01/12/21 0445 01/12/21 0829  BP: (!) 153/77 136/78  Pulse: 64 62  Resp: 20 (!) 21  Temp: 98.5 F (36.9 C) 98.4 F (36.9 C)  SpO2: 92% 93%   Vitals:   01/11/21 1938 01/12/21 0445 01/12/21 0455 01/12/21 0829  BP: 127/66 (!) 153/77  136/78  Pulse: 68 64  62  Resp: 20 20  (!) 21  Temp: 98.2 F (36.8 C) 98.5 F (36.9 C)  98.4 F (36.9 C)  TempSrc: Oral Oral  Oral  SpO2: 91% 92%  93%  Weight:   50 kg   Height:        General: Pt is alert, awake, not in acute distress Cardiovascular: RRR, S1/S2 +, no rubs, no gallops Respiratory: Faint rhonchi at the bases  bilaterally. Abdominal: Soft, NT, ND, bowel sounds + Extremities: no edema, no cyanosis    The results of significant diagnostics from this hospitalization (including imaging, microbiology, ancillary and laboratory) are listed below for reference.     Microbiology: Recent Results (from the past 240 hour(s))  Resp Panel by RT-PCR (Flu A&B, Covid) Nasopharyngeal Swab     Status: None   Collection Time: 01/06/21  9:47 AM   Specimen: Nasopharyngeal Swab; Nasopharyngeal(NP) swabs in vial transport medium  Result Value Ref Range Status   SARS Coronavirus 2 by RT PCR NEGATIVE NEGATIVE Final    Comment: (NOTE) SARS-CoV-2 target nucleic acids are NOT DETECTED.  The SARS-CoV-2 RNA is generally detectable in upper respiratory specimens during the acute phase of infection. The lowest concentration of SARS-CoV-2 viral copies this assay can detect is 138 copies/mL. A negative result does not preclude SARS-Cov-2 infection and should not be used as the sole basis for treatment or other patient management decisions. A negative result may occur with  improper specimen collection/handling, submission of specimen other than nasopharyngeal swab, presence of viral mutation(s) within the areas targeted by this assay, and inadequate number of viral copies(<138 copies/mL). A negative result must be combined with clinical observations, patient history, and epidemiological information. The expected result is Negative.  Fact Sheet for Patients:  EntrepreneurPulse.com.au  Fact Sheet for Healthcare Providers:  IncredibleEmployment.be  This test is no t yet approved or cleared by the [place] FDA and  has been authorized for detection and/or diagnosis of SARS-CoV-2 by FDA under an Emergency Use Authorization (EUA). This EUA will remain  in effect (meaning this test can be used) for the duration of the COVID-19 declaration under Section 564(b)(1) of the Act,  21 U.S.C.section 360bbb-3(b)(1), unless the authorization is terminated  or revoked sooner.       Influenza A by PCR NEGATIVE NEGATIVE Final   Influenza B by PCR NEGATIVE NEGATIVE Final    Comment: (NOTE) The Xpert Xpress SARS-CoV-2/FLU/RSV plus assay is intended as an aid in the diagnosis of influenza from Nasopharyngeal swab specimens and should not be used as a sole basis for treatment. Nasal washings and aspirates are unacceptable for Xpert Xpress SARS-CoV-2/FLU/RSV testing.  Fact Sheet for Patients: EntrepreneurPulse.com.au  Fact Sheet for Healthcare Providers: IncredibleEmployment.be  This test is not yet approved or cleared by the [place] FDA and has been authorized for detection and/or diagnosis of SARS-CoV-2 by FDA under an Emergency Use Authorization (EUA). This  EUA will remain in effect (meaning this test can be used) for the duration of the COVID-19 declaration under Section 564(b)(1) of the Act, 21 U.S.C. section 360bbb-3(b)(1), unless the authorization is terminated or revoked.  Performed at [facility], [place] [address] [place], [place] [address]   Urine Culture     Status: None   Collection Time: 01/06/21  7:57 PM   Specimen: Urine, Clean Catch  Result Value Ref Range Status   Specimen Description URINE, CLEAN CATCH  Final   Special Requests NONE  Final   Culture   Final    NO GROWTH Performed at [facility], [place] [address], [place], [place] [address]    Report Status 01/09/2021 FINAL  Final  Respiratory (~20 pathogens) panel by PCR     Status: Abnormal   Collection Time: 01/06/21  8:18 PM   Specimen: Nasopharyngeal Swab; Respiratory  Result Value Ref Range Status   Adenovirus NOT DETECTED NOT DETECTED Final   Coronavirus 229E NOT DETECTED NOT DETECTED Final    Comment: (NOTE) The Coronavirus on the Respiratory Panel, DOES NOT test for the novel  Coronavirus (2019 nCoV)    Coronavirus HKU1 NOT  DETECTED NOT DETECTED Final   Coronavirus NL63 NOT DETECTED NOT DETECTED Final   Coronavirus OC43 NOT DETECTED NOT DETECTED Final   Metapneumovirus NOT DETECTED NOT DETECTED Final   Rhinovirus / Enterovirus DETECTED (A) NOT DETECTED Final   Influenza A NOT DETECTED NOT DETECTED Final   Influenza B NOT DETECTED NOT DETECTED Final   Parainfluenza Virus 1 NOT DETECTED NOT DETECTED Final   Parainfluenza Virus 2 NOT DETECTED NOT DETECTED Final   Parainfluenza Virus 3 NOT DETECTED NOT DETECTED Final   Parainfluenza Virus 4 NOT DETECTED NOT DETECTED Final   Respiratory Syncytial Virus NOT DETECTED NOT DETECTED Final   Bordetella pertussis NOT DETECTED NOT DETECTED Final   Bordetella Parapertussis NOT DETECTED NOT DETECTED Final   Chlamydophila pneumoniae NOT DETECTED NOT DETECTED Final   Mycoplasma pneumoniae NOT DETECTED NOT DETECTED Final    Comment: Performed at [facility], [place]. [address] [place],  [address]  Resp Panel by RT-PCR (Flu A&B, Covid) Nasopharyngeal Swab     Status: None   Collection Time: 01/09/21 12:10 PM   Specimen: Nasopharyngeal Swab; Nasopharyngeal(NP) swabs in vial transport medium  Result Value Ref Range Status   SARS Coronavirus 2 by RT PCR NEGATIVE NEGATIVE Final    Comment: (NOTE) SARS-CoV-2 target nucleic acids are NOT DETECTED.  The SARS-CoV-2 RNA is generally detectable in upper respiratory specimens during the acute phase of infection. The lowest concentration of SARS-CoV-2 viral copies this assay can detect is 138 copies/mL. A negative result does not preclude SARS-Cov-2 infection and should not be used as the sole basis for treatment or other patient management decisions. A negative result may occur with  improper specimen collection/handling, submission of specimen other than nasopharyngeal swab, presence of viral mutation(s) within the areas targeted by this assay, and inadequate number of viral copies(<138 copies/mL). A negative result  must be combined with clinical observations, patient history, and epidemiological information. The expected result is Negative.  Fact Sheet for Patients:  EntrepreneurPulse.com.au  Fact Sheet for Healthcare Providers:  IncredibleEmployment.be  This test is no t yet approved or cleared by the [place] FDA and  has been authorized for detection and/or diagnosis of SARS-CoV-2 by FDA under an Emergency Use Authorization (EUA). This EUA will remain  in effect (meaning this test can  be used) for the duration of the COVID-19 declaration under Section 564(b)(1) of the Act, 21 U.S.C.section 360bbb-3(b)(1), unless the authorization is terminated  or revoked sooner.       Influenza A by PCR NEGATIVE NEGATIVE Final   Influenza B by PCR NEGATIVE NEGATIVE Final    Comment: (NOTE) The Xpert Xpress SARS-CoV-2/FLU/RSV plus assay is intended as an aid in the diagnosis of influenza from Nasopharyngeal swab specimens and should not be used as a sole basis for treatment. Nasal washings and aspirates are unacceptable for Xpert Xpress SARS-CoV-2/FLU/RSV testing.  Fact Sheet for Patients: EntrepreneurPulse.com.au  Fact Sheet for Healthcare Providers: IncredibleEmployment.be  This test is not yet approved or cleared by the [place] FDA and has been authorized for detection and/or diagnosis of SARS-CoV-2 by FDA under an Emergency Use Authorization (EUA). This EUA will remain in effect (meaning this test can be used) for the duration of the COVID-19 declaration under Section 564(b)(1) of the Act, 21 U.S.C. section 360bbb-3(b)(1), unless the authorization is terminated or revoked.  Performed at [facility], [place] [address] [place], [place] [address]      Labs: BNP (last 3 results) Recent Labs    01/06/21 1025  BNP 944.9*   Basic Metabolic Panel: Recent Labs  Lab 01/06/21 1025 01/07/21 0324  01/08/21 0338 01/09/21 0439 01/12/21 0356  NA 130* 132* 129* 132* 131*  K 4.6 4.0 3.5 3.4* 4.1  CL 97* 96* 96* 98 100  CO2 21* 27 27 28 25   GLUCOSE 88 123* 103* 90 93  BUN 17 15 25* 21 11  CREATININE 1.14* 1.05* 0.98 0.96 0.78  CALCIUM 8.5* 8.5* 8.2* 8.3* 8.4*  MG  --  2.0 1.9  --  1.9   Liver Function Tests: No results for input(s): AST, ALT, ALKPHOS, BILITOT, PROT, ALBUMIN in the last 168 hours. No results for input(s): LIPASE, AMYLASE in the last 168 hours. No results for input(s): AMMONIA in the last 168 hours. CBC: Recent Labs  Lab 01/06/21 1025 01/06/21 2215 01/07/21 0324 01/08/21 0338 01/09/21 0439  WBC 18.8*  --  12.4* 17.5* 15.4*  NEUTROABS 16.5*  --   --  15.3* 13.2*  HGB 9.6* 9.0* 10.3* 9.5* 10.6*  HCT 29.3* 27.6* 30.8* 28.8* 31.4*  MCV 92.1  --  91.7 92.0 90.8  PLT 148*  --  144* 193 210   Cardiac Enzymes: No results for input(s): CKTOTAL, CKMB, CKMBINDEX, TROPONINI in the last 168 hours. BNP: Invalid input(s): POCBNP CBG: No results for input(s): GLUCAP in the last 168 hours. D-Dimer No results for input(s): DDIMER in the last 72 hours. Hgb A1c No results for input(s): HGBA1C in the last 72 hours. Lipid Profile No results for input(s): CHOL, HDL, LDLCALC, TRIG, CHOLHDL, LDLDIRECT in the last 72 hours. Thyroid function studies No results for input(s): TSH, T4TOTAL, T3FREE, THYROIDAB in the last 72 hours.  Invalid input(s): FREET3 Anemia work up No results for input(s): VITAMINB12, FOLATE, FERRITIN, TIBC, IRON, RETICCTPCT in the last 72 hours. Urinalysis    Component Value Date/Time   COLORURINE YELLOW 01/06/2021 1957   APPEARANCEUR CLEAR 01/06/2021 1957   LABSPEC 1.012 01/06/2021 1957   PHURINE 5.0 01/06/2021 1957   GLUCOSEU NEGATIVE 01/06/2021 1957   GLUCOSEU NEGATIVE 04/11/2017 1536   HGBUR MODERATE (A) 01/06/2021 1957   BILIRUBINUR NEGATIVE 01/06/2021 1957   BILIRUBINUR neg 11/18/2016 1520   KETONESUR NEGATIVE 01/06/2021 1957   PROTEINUR  NEGATIVE 01/06/2021 1957   UROBILINOGEN 0.2 04/11/2017 1536   NITRITE  NEGATIVE 01/06/2021 1957   LEUKOCYTESUR LARGE (A) 01/06/2021 1957   Sepsis Labs Invalid input(s): PROCALCITONIN,  WBC,  LACTICIDVEN Microbiology Recent Results (from the past 240 hour(s))  Resp Panel by RT-PCR (Flu A&B, Covid) Nasopharyngeal Swab     Status: None   Collection Time: 01/06/21  9:47 AM   Specimen: Nasopharyngeal Swab; Nasopharyngeal(NP) swabs in vial transport medium  Result Value Ref Range Status   SARS Coronavirus 2 by RT PCR NEGATIVE NEGATIVE Final    Comment: (NOTE) SARS-CoV-2 target nucleic acids are NOT DETECTED.  The SARS-CoV-2 RNA is generally detectable in upper respiratory specimens during the acute phase of infection. The lowest concentration of SARS-CoV-2 viral copies this assay can detect is 138 copies/mL. A negative result does not preclude SARS-Cov-2 infection and should not be used as the sole basis for treatment or other patient management decisions. A negative result may occur with  improper specimen collection/handling, submission of specimen other than nasopharyngeal swab, presence of viral mutation(s) within the areas targeted by this assay, and inadequate number of viral copies(<138 copies/mL). A negative result must be combined with clinical observations, patient history, and epidemiological information. The expected result is Negative.  Fact Sheet for Patients:  EntrepreneurPulse.com.au  Fact Sheet for Healthcare Providers:  IncredibleEmployment.be  This test is no t yet approved or cleared by the [place] FDA and  has been authorized for detection and/or diagnosis of SARS-CoV-2 by FDA under an Emergency Use Authorization (EUA). This EUA will remain  in effect (meaning this test can be used) for the duration of the COVID-19 declaration under Section 564(b)(1) of the Act, 21 U.S.C.section 360bbb-3(b)(1), unless the authorization is  terminated  or revoked sooner.       Influenza A by PCR NEGATIVE NEGATIVE Final   Influenza B by PCR NEGATIVE NEGATIVE Final    Comment: (NOTE) The Xpert Xpress SARS-CoV-2/FLU/RSV plus assay is intended as an aid in the diagnosis of influenza from Nasopharyngeal swab specimens and should not be used as a sole basis for treatment. Nasal washings and aspirates are unacceptable for Xpert Xpress SARS-CoV-2/FLU/RSV testing.  Fact Sheet for Patients: EntrepreneurPulse.com.au  Fact Sheet for Healthcare Providers: IncredibleEmployment.be  This test is not yet approved or cleared by the [place] FDA and has been authorized for detection and/or diagnosis of SARS-CoV-2 by FDA under an Emergency Use Authorization (EUA). This EUA will remain in effect (meaning this test can be used) for the duration of the COVID-19 declaration under Section 564(b)(1) of the Act, 21 U.S.C. section 360bbb-3(b)(1), unless the authorization is terminated or revoked.  Performed at [facility], [place] [address], [place], [place] [address]   Urine Culture     Status: None   Collection Time: 01/06/21  7:57 PM   Specimen: Urine, Clean Catch  Result Value Ref Range Status   Specimen Description URINE, CLEAN CATCH  Final   Special Requests NONE  Final   Culture   Final    NO GROWTH Performed at [facility], [place] [address], [place], [place] [address]    Report Status 01/09/2021 FINAL  Final  Respiratory (~20 pathogens) panel by PCR     Status: Abnormal   Collection Time: 01/06/21  8:18 PM   Specimen: Nasopharyngeal Swab; Respiratory  Result Value Ref Range Status   Adenovirus NOT DETECTED NOT DETECTED Final   Coronavirus 229E NOT DETECTED NOT DETECTED Final    Comment: (NOTE) The Coronavirus on the Respiratory Panel, DOES NOT test for the novel  Coronavirus (2019 nCoV)    Coronavirus HKU1 NOT DETECTED NOT DETECTED Final   Coronavirus NL63 NOT DETECTED  NOT DETECTED Final   Coronavirus OC43 NOT DETECTED NOT DETECTED Final   Metapneumovirus NOT DETECTED NOT DETECTED Final   Rhinovirus / Enterovirus DETECTED (A) NOT DETECTED Final   Influenza A NOT DETECTED NOT DETECTED Final   Influenza B NOT DETECTED NOT DETECTED Final   Parainfluenza Virus 1 NOT DETECTED NOT DETECTED Final   Parainfluenza Virus 2 NOT DETECTED NOT DETECTED Final   Parainfluenza Virus 3 NOT DETECTED NOT DETECTED Final   Parainfluenza Virus 4 NOT DETECTED NOT DETECTED Final   Respiratory Syncytial Virus NOT DETECTED NOT DETECTED Final   Bordetella pertussis NOT DETECTED NOT DETECTED Final   Bordetella Parapertussis NOT DETECTED NOT DETECTED Final   Chlamydophila pneumoniae NOT DETECTED NOT DETECTED Final   Mycoplasma pneumoniae NOT DETECTED NOT DETECTED Final    Comment: Performed at [facility], [place] [address] [place], [place] [address]  Resp Panel by RT-PCR (Flu A&B, Covid) Nasopharyngeal Swab     Status: None   Collection Time: 01/09/21 12:10 PM   Specimen: Nasopharyngeal Swab; Nasopharyngeal(NP) swabs in vial transport medium  Result Value Ref Range Status   SARS Coronavirus 2 by RT PCR NEGATIVE NEGATIVE Final    Comment: (NOTE) SARS-CoV-2 target nucleic acids are NOT DETECTED.  The SARS-CoV-2 RNA is generally detectable in upper respiratory specimens during the acute phase of infection. The lowest concentration of SARS-CoV-2 viral copies this assay can detect is 138 copies/mL. A negative result does not preclude SARS-Cov-2 infection and should not be used as the sole basis for treatment or other patient management decisions. A negative result may occur with  improper specimen collection/handling, submission of specimen other than nasopharyngeal swab, presence of viral mutation(s) within the areas targeted by this assay, and inadequate number of viral copies(<138 copies/mL). A negative result must be combined with clinical observations, patient history,  and epidemiological information. The expected result is Negative.  Fact Sheet for Patients:  EntrepreneurPulse.com.au  Fact Sheet for Healthcare Providers:  IncredibleEmployment.be  This test is no t yet approved or cleared by the [place] FDA and  has been authorized for detection and/or diagnosis of SARS-CoV-2 by FDA under an Emergency Use Authorization (EUA). This EUA will remain  in effect (meaning this test can be used) for the duration of the COVID-19 declaration under Section 564(b)(1) of the Act, 21 U.S.C.section 360bbb-3(b)(1), unless the authorization is terminated  or revoked sooner.       Influenza A by PCR NEGATIVE NEGATIVE Final   Influenza B by PCR NEGATIVE NEGATIVE Final    Comment: (NOTE) The Xpert Xpress SARS-CoV-2/FLU/RSV plus assay is intended as an aid in the diagnosis of influenza from Nasopharyngeal swab specimens and should not be used as a sole basis for treatment. Nasal washings and aspirates are unacceptable for Xpert Xpress SARS-CoV-2/FLU/RSV testing.  Fact Sheet for Patients: EntrepreneurPulse.com.au  Fact Sheet for Healthcare Providers: IncredibleEmployment.be  This test is not yet approved or cleared by the [place] FDA and has been authorized for detection and/or diagnosis of SARS-CoV-2 by FDA under an Emergency Use Authorization (EUA). This EUA will remain in effect (meaning this test can be used) for the duration of the COVID-19 declaration under Section 564(b)(1) of the Act, 21 U.S.C. section 360bbb-3(b)(1), unless the authorization is terminated or revoked.  Performed at [facility], [place] [address] [place], [place] [address]  Time coordinating discharge: Over 30 minutes  SIGNED:   Darliss Cheney, MD  [facility] 01/12/2021, 10:35 AM  If 7PM-7AM, please contact night-coverage [email or web address]

## 2021-01-12 NOTE — Care Management Important Message (Signed)
Important Message  Patient Details  Name: Cathy [name] MRN: [id number] Date of Birth: 03-Jul-1927   Medicare Important Message Given:  Yes     Shelda Altes 01/12/2021, 10:31 AM

## 2021-01-12 NOTE — Progress Notes (Signed)
Report given to [employer]. RN will call pt's daughter when Corey Harold gets here.

## 2021-01-12 NOTE — Plan of Care (Signed)

## 2021-01-12 NOTE — Plan of Care (Signed)
  Problem: Clinical Measurements: Goal: Respiratory complications will improve Outcome: Progressing   Problem: Education: Goal: Knowledge of General Education information will improve Description: Including pain rating scale, medication(s)/side effects and non-pharmacologic comfort measures Outcome: Adequate for Discharge   Problem: Health Behavior/Discharge Planning: Goal: Ability to manage health-related needs will improve Outcome: Adequate for Discharge   Problem: Clinical Measurements: Goal: Ability to maintain clinical measurements within normal limits will improve Outcome: Adequate for Discharge Goal: Will remain free from infection Outcome: Adequate for Discharge Goal: Diagnostic test results will improve Outcome: Adequate for Discharge Goal: Cardiovascular complication will be avoided Outcome: Adequate for Discharge   Problem: Activity: Goal: Risk for activity intolerance will decrease Outcome: Adequate for Discharge   Problem: Nutrition: Goal: Adequate nutrition will be maintained Outcome: Adequate for Discharge   Problem: Coping: Goal: Level of anxiety will decrease Outcome: Adequate for Discharge   Problem: Elimination: Goal: Will not experience complications related to bowel motility Outcome: Adequate for Discharge Goal: Will not experience complications related to urinary retention Outcome: Adequate for Discharge   Problem: Pain Managment: Goal: General experience of comfort will improve Outcome: Adequate for Discharge   Problem: Safety: Goal: Ability to remain free from injury will improve Outcome: Adequate for Discharge   Problem: Skin Integrity: Goal: Risk for impaired skin integrity will decrease Outcome: Adequate for Discharge   Problem: Education: Goal: Ability to demonstrate management of disease process will improve Outcome: Adequate for Discharge Goal: Ability to verbalize understanding of medication therapies will improve Outcome:  Adequate for Discharge Goal: Individualized Educational Video(s) Outcome: Adequate for Discharge   Problem: Activity: Goal: Capacity to carry out activities will improve Outcome: Adequate for Discharge   Problem: Cardiac: Goal: Ability to achieve and maintain adequate cardiopulmonary perfusion will improve Outcome: Adequate for Discharge

## 2021-01-12 NOTE — NC FL2 (Signed)
[place] LEVEL OF CARE SCREENING TOOL     IDENTIFICATION  Patient Name: Cathy [name] Birthdate: [birth year] Sex: female Admission Date (Current Location): 01/06/2021  [facility] and [place] Number:  Herbalist and Address:  [facility], [place] [address] [place],  [address]      Provider Number: [id number]  Attending Physician Name and Address:  Darliss Cheney, MD  Relative Name and Phone Number:  Latessa, Tillis     [phone]  Daughter-in-law    Current Level of Care: Hospital Recommended Level of Care: [place] Prior Approval Number:    Date Approved/Denied:   PASRR Number:    Discharge Plan: SNF    Current Diagnoses: Patient Active Problem List   Diagnosis Date Noted   Malnutrition of moderate degree 01/09/2021   Acute respiratory failure with hypoxia ([place]) 01/07/2021   Severe sepsis ([place]) 01/07/2021   Acute respiratory distress 01/06/2021   Community acquired pneumonia 01/06/2021   Heart failure with preserved ejection fraction ([place]) 01/06/2021   Hyponatremia 01/06/2021   SIRS (systemic inflammatory response syndrome) ([place]) 01/06/2021   Primary osteoarthritis of both hands 07/06/2019   Hip fracture ([place]) 06/29/2019   Closed fracture of neck of left femur ([place]) 06/28/2019   GERD without esophagitis 06/28/2019   Noninfected skin tear of leg, right, initial encounter 04/19/2018   Bowel obstruction ([place]) 02/21/2018   Hypothyroidism 02/21/2018   Osteoarthritis of left shoulder 08/10/2017   Pain in joint of left shoulder 08/10/2017   Mass of joint of left wrist 06/22/2017   Arthritis of left wrist 06/21/2017   Carpal tunnel syndrome 06/21/2017   Tenosynovitis of left wrist 06/21/2017   Fracture of distal end of humerus 03/25/2017   Closed supracondylar fracture of left humerus 01/15/2017   Gait disorder 01/15/2017   Recurrent falls 01/15/2017   Rib fractures 11/28/2016   Skin lesion 09/30/2016    Ganglion cyst of dorsum of left wrist 09/30/2016   Rotator cuff arthropathy, right 04/26/2016   Hyperglycemia 04/01/2016   Right arm pain 04/01/2016   Wrist swelling 09/30/2015   Preventative health care 04/02/2015   Chronic combined systolic and diastolic CHF (congestive heart failure) ([place]) 03/17/2015   Coronary artery calcification seen on CT scan 03/17/2015   Sinus bradycardia 03/17/2015   Splenic lesion 02/18/2015   Liver lesion 02/18/2015   Coronary artery disease involving native coronary artery of native heart without angina pectoris 02/03/2015   Atrial fibrillation and flutter ([place]) 02/02/2015   Fatigue 01/31/2015   Abnormal CT of the abdomen 01/31/2015   Prolonged Q-T interval on ECG 01/31/2015   Hepatic lesion    Chronic constipation 12/03/2014   Lower extremity edema 04/25/2014   Peripheral neuropathy ([place]) 12/10/2013   Overactive bladder 12/27/2011   Senile osteoporosis 12/27/2011   Vitamin B12 deficiency 05/03/2010   Iron deficiency anemia 05/03/2010   Normocytic anemia 05/03/2010   VENOUS INSUFFICIENCY 01/27/2009   BACK PAIN, LUMBAR 01/27/2008   URINARY INCONTINENCE 08/23/2007   DIVERTICULOSIS OF COLON 04/10/2007   HYPERCHOLESTEROLEMIA 02/13/2007   ANXIETY 02/13/2007   Essential hypertension 02/13/2007   Diaphragmatic hernia 02/13/2007   Irritable bowel syndrome 02/13/2007   DEGENERATIVE JOINT DISEASE 02/13/2007   Allergic rhinitis 02/13/2007    Orientation RESPIRATION BLADDER Height & Weight     Self, Situation, Place  O2 Incontinent, External catheter Weight: 110 lb 3.7 oz (50 kg) Height:  4\' 7"  (139.7 cm)  BEHAVIORAL SYMPTOMS/MOOD NEUROLOGICAL BOWEL NUTRITION STATUS  Continent Diet (See DC Summary)  AMBULATORY STATUS COMMUNICATION OF NEEDS Skin   Limited Assist Verbally Normal                       Personal Care Assistance Level of Assistance  Bathing, Dressing, Feeding Bathing Assistance: Limited assistance Feeding assistance:  Independent Dressing Assistance: Limited assistance     Functional Limitations Info  Sight, Hearing, Speech Sight Info: Adequate Hearing Info: Adequate Speech Info: Adequate    SPECIAL CARE FACTORS FREQUENCY  PT (By licensed PT), OT (By licensed OT)     PT Frequency: 5x a week OT Frequency: 5x a week            Contractures Contractures Info: Not present    Additional Factors Info  Code Status, Allergies Code Status Info: Full Allergies Info: Amoxicillin-pot Clavulanate   Penicillins   Sulfonamide Derivatives           Current Medications (01/12/2021):  This is the current hospital active medication list Current Facility-Administered Medications  Medication Dose Route Frequency Provider Last Rate Last Admin   0.9 %  sodium chloride infusion   Intravenous PRN Darliss Cheney, MD   Stopped at 01/12/21 0034   acetaminophen (TYLENOL) tablet 650 mg  650 mg Oral Q6H PRN Norval Morton, MD       Or   acetaminophen (TYLENOL) suppository 650 mg  650 mg Rectal Q6H PRN Fuller Plan A, MD       acetaminophen (TYLENOL) tablet 650 mg  650 mg Oral Q8H Smith, Rondell A, MD   650 mg at 01/12/21 0544   albuterol (PROVENTIL) (2.5 MG/3ML) 0.083% nebulizer solution 2.5 mg  2.5 mg Nebulization Q6H PRN Fuller Plan A, MD       amiodarone (PACERONE) tablet 200 mg  200 mg Oral Daily Tamala Julian, Rondell A, MD   200 mg at 01/12/21 0837   apixaban (ELIQUIS) tablet 2.5 mg  2.5 mg Oral BID Fuller Plan A, MD   2.5 mg at 01/12/21 0834   atorvastatin (LIPITOR) tablet 20 mg  20 mg Oral q1800 Fuller Plan A, MD   20 mg at 01/11/21 1713   bacitracin ointment 1 application  1 application Topical BID Fuller Plan A, MD   1 application at 40/81/44 0841   darifenacin (ENABLEX) 24 hr tablet 15 mg  15 mg Oral Daily Smith, Rondell A, MD   15 mg at 01/12/21 0840   doxycycline (VIBRAMYCIN) 100 mg in sodium chloride 0.9 % 250 mL IVPB  100 mg Intravenous Q12H Smith, Rondell A, MD 125 mL/hr at 01/12/21 0851 100  mg at 01/12/21 0851   feeding supplement (ENSURE ENLIVE / ENSURE PLUS) liquid 237 mL  237 mL Oral BID BM Pahwani, Einar Grad, MD   237 mL at 01/12/21 0832   guaiFENesin (MUCINEX) 12 hr tablet 600 mg  600 mg Oral BID Tamala Julian, Rondell A, MD   600 mg at 01/12/21 0837   guaiFENesin-dextromethorphan (ROBITUSSIN DM) 100-10 MG/5ML syrup 5 mL  5 mL Oral Q4H PRN Darliss Cheney, MD   5 mL at 01/12/21 1105   isosorbide mononitrate (IMDUR) 24 hr tablet 60 mg  60 mg Oral Daily Tamala Julian, Rondell A, MD   60 mg at 01/12/21 0834   levothyroxine (SYNTHROID) tablet 62.5 mcg  62.5 mcg Oral QAC breakfast Fuller Plan A, MD   62.5 mcg at 01/12/21 0545   multivitamin with minerals tablet 1 tablet  1 tablet Oral Daily Darliss Cheney, MD   1  tablet at 01/12/21 0833   polyethylene glycol (MIRALAX / GLYCOLAX) packet 17 g  17 g Oral Daily PRN Fuller Plan A, MD       saccharomyces boulardii (FLORASTOR) capsule 250 mg  250 mg Oral BID Tamala Julian, Rondell A, MD   250 mg at 01/12/21 0835   sodium chloride flush (NS) 0.9 % injection 3 mL  3 mL Intravenous Q12H Smith, Rondell A, MD   3 mL at 01/12/21 0836   vitamin B-12 (CYANOCOBALAMIN) tablet 1,000 mcg  1,000 mcg Oral Daily Darliss Cheney, MD   1,000 mcg at 01/12/21 0044     Discharge Medications: Please see discharge summary for a list of discharge medications.  Relevant Imaging Results:  Relevant Lab Results:   Additional Information SSN: [id number]; [place] COVID-19 Vaccine 05/01/2019 , 04/05/2019  Reece Agar, LCSWA

## 2021-01-12 NOTE — TOC Transition Note (Addendum)
Transition of Care [facility]) - CM/SW Discharge Note   Patient Details  Name: Cathy [name] MRN: [id number] Date of Birth: [birth year]  [facility] CM/SW Contact:  Tresa Endo Phone Number: 01/12/2021, 12:40 PM   Clinical Narrative:    Patient will DC to: [facility] Anticipated DC date: 01/12/2021 Family notified: Pt Daughter-in-law Transport by: Corey Harold   Per MD patient ready for DC to [facility] room 611. RN to call report prior to discharge [phone]). RN, patient, patient's family, and facility notified of DC. Discharge Summary and FL2 sent to facility. DC packet on chart. Ambulance transport requested for patient.   CSW will sign off for now as social work intervention is no longer needed. Please consult [place] again if new needs arise.     Final next level of care: Skilled Nursing Facility Barriers to Discharge: Continued Medical Work up   Patient Goals and CMS Choice Patient states their goals for this hospitalization and ongoing recovery are:: Rehab CMS Medicare.gov Compare Post Acute Care list provided to:: Patient Choice offered to / list presented to : Patient  Discharge Placement                       Discharge Plan and Services In-house Referral: Clinical Social Work Discharge Planning Services: CM Consult Post Acute Care Choice: Skilled Nursing Facility                               Social Determinants of Health (SDOH) Interventions     Readmission Risk Interventions No flowsheet data found.

## 2021-01-12 NOTE — Progress Notes (Signed)
Patient left unit with [facility] via stretcher with all belongings and discharge packet. Daughter called and informed of same.

## 2021-01-12 NOTE — Progress Notes (Signed)
Pt's IV d/c, changed to her personal clothing. Awaiting for transport.

## 2021-01-13 ENCOUNTER — Other Ambulatory Visit: Payer: Self-pay | Admitting: Cardiology

## 2021-01-13 DIAGNOSIS — I4892 Unspecified atrial flutter: Secondary | ICD-10-CM

## 2021-01-13 NOTE — Telephone Encounter (Signed)
Eliquis 2.5mg  refill request received. Patient is 87 years old, weight-50kg, Crea-0.78 on 01/12/2021, Diagnosis-Aflutter, and last seen by Tommye Standard, PA on 01/02/2021. Dose is appropriate based on dosing criteria. Will send in refill to requested pharmacy.

## 2021-01-16 ENCOUNTER — Telehealth (HOSPITAL_COMMUNITY): Payer: Self-pay | Admitting: Physician Assistant

## 2021-01-16 NOTE — Telephone Encounter (Signed)
Patient called and cancelled echocardiogram for reason below:  01/13/2021 8:17 AM [id number], Cathy [name]  Cancel Rsn: Patient (pneumonia)  Order will be removed from the active Wq and when patient calls back to schedule we will reinstate the order.

## 2021-01-19 ENCOUNTER — Emergency Department (HOSPITAL_COMMUNITY): Payer: Medicare Other

## 2021-01-19 ENCOUNTER — Other Ambulatory Visit: Payer: Self-pay

## 2021-01-19 ENCOUNTER — Encounter (HOSPITAL_COMMUNITY): Payer: Self-pay

## 2021-01-19 ENCOUNTER — Inpatient Hospital Stay (HOSPITAL_COMMUNITY)
Admission: EM | Admit: 2021-01-19 | Discharge: 2021-02-06 | DRG: 177 | Disposition: A | Discharge status: Hospice | Payer: Medicare Other | Source: Skilled Nursing Facility | Attending: Student | Admitting: Student

## 2021-01-19 DIAGNOSIS — R0602 Shortness of breath: Secondary | ICD-10-CM

## 2021-01-19 DIAGNOSIS — M810 Age-related osteoporosis without current pathological fracture: Secondary | ICD-10-CM | POA: Diagnosis present

## 2021-01-19 DIAGNOSIS — R0600 Dyspnea, unspecified: Secondary | ICD-10-CM

## 2021-01-19 DIAGNOSIS — J9621 Acute and chronic respiratory failure with hypoxia: Secondary | ICD-10-CM | POA: Diagnosis present

## 2021-01-19 DIAGNOSIS — C4490 Unspecified malignant neoplasm of skin, unspecified: Secondary | ICD-10-CM | POA: Diagnosis present

## 2021-01-19 DIAGNOSIS — J690 Pneumonitis due to inhalation of food and vomit: Secondary | ICD-10-CM | POA: Diagnosis present

## 2021-01-19 DIAGNOSIS — R9431 Abnormal electrocardiogram [ECG] [EKG]: Secondary | ICD-10-CM | POA: Diagnosis present

## 2021-01-19 DIAGNOSIS — Z7189 Other specified counseling: Secondary | ICD-10-CM | POA: Diagnosis not present

## 2021-01-19 DIAGNOSIS — Z79890 Hormone replacement therapy (postmenopausal): Secondary | ICD-10-CM

## 2021-01-19 DIAGNOSIS — I480 Paroxysmal atrial fibrillation: Secondary | ICD-10-CM | POA: Diagnosis present

## 2021-01-19 DIAGNOSIS — R64 Cachexia: Secondary | ICD-10-CM | POA: Diagnosis present

## 2021-01-19 DIAGNOSIS — Z9981 Dependence on supplemental oxygen: Secondary | ICD-10-CM

## 2021-01-19 DIAGNOSIS — A419 Sepsis, unspecified organism: Secondary | ICD-10-CM

## 2021-01-19 DIAGNOSIS — Z90710 Acquired absence of both cervix and uterus: Secondary | ICD-10-CM

## 2021-01-19 DIAGNOSIS — J449 Chronic obstructive pulmonary disease, unspecified: Secondary | ICD-10-CM | POA: Diagnosis present

## 2021-01-19 DIAGNOSIS — R0902 Hypoxemia: Secondary | ICD-10-CM | POA: Diagnosis not present

## 2021-01-19 DIAGNOSIS — E7800 Pure hypercholesterolemia, unspecified: Secondary | ICD-10-CM | POA: Diagnosis present

## 2021-01-19 DIAGNOSIS — K219 Gastro-esophageal reflux disease without esophagitis: Secondary | ICD-10-CM | POA: Diagnosis present

## 2021-01-19 DIAGNOSIS — I4891 Unspecified atrial fibrillation: Secondary | ICD-10-CM | POA: Diagnosis present

## 2021-01-19 DIAGNOSIS — D509 Iron deficiency anemia, unspecified: Secondary | ICD-10-CM | POA: Diagnosis present

## 2021-01-19 DIAGNOSIS — K589 Irritable bowel syndrome without diarrhea: Secondary | ICD-10-CM | POA: Diagnosis present

## 2021-01-19 DIAGNOSIS — M1990 Unspecified osteoarthritis, unspecified site: Secondary | ICD-10-CM | POA: Diagnosis present

## 2021-01-19 DIAGNOSIS — Z881 Allergy status to other antibiotic agents status: Secondary | ICD-10-CM

## 2021-01-19 DIAGNOSIS — D638 Anemia in other chronic diseases classified elsewhere: Secondary | ICD-10-CM | POA: Diagnosis present

## 2021-01-19 DIAGNOSIS — I4892 Unspecified atrial flutter: Secondary | ICD-10-CM | POA: Diagnosis present

## 2021-01-19 DIAGNOSIS — B3731 Acute candidiasis of vulva and vagina: Secondary | ICD-10-CM | POA: Diagnosis present

## 2021-01-19 DIAGNOSIS — Z8673 Personal history of transient ischemic attack (TIA), and cerebral infarction without residual deficits: Secondary | ICD-10-CM

## 2021-01-19 DIAGNOSIS — R5381 Other malaise: Secondary | ICD-10-CM | POA: Diagnosis not present

## 2021-01-19 DIAGNOSIS — E861 Hypovolemia: Secondary | ICD-10-CM | POA: Diagnosis present

## 2021-01-19 DIAGNOSIS — I5032 Chronic diastolic (congestive) heart failure: Secondary | ICD-10-CM

## 2021-01-19 DIAGNOSIS — I872 Venous insufficiency (chronic) (peripheral): Secondary | ICD-10-CM | POA: Diagnosis present

## 2021-01-19 DIAGNOSIS — Z20822 Contact with and (suspected) exposure to covid-19: Secondary | ICD-10-CM | POA: Diagnosis present

## 2021-01-19 DIAGNOSIS — I2510 Atherosclerotic heart disease of native coronary artery without angina pectoris: Secondary | ICD-10-CM | POA: Diagnosis present

## 2021-01-19 DIAGNOSIS — E871 Hypo-osmolality and hyponatremia: Secondary | ICD-10-CM | POA: Diagnosis present

## 2021-01-19 DIAGNOSIS — G629 Polyneuropathy, unspecified: Secondary | ICD-10-CM | POA: Diagnosis present

## 2021-01-19 DIAGNOSIS — Z515 Encounter for palliative care: Secondary | ICD-10-CM | POA: Diagnosis not present

## 2021-01-19 DIAGNOSIS — D649 Anemia, unspecified: Secondary | ICD-10-CM | POA: Diagnosis present

## 2021-01-19 DIAGNOSIS — K5900 Constipation, unspecified: Secondary | ICD-10-CM | POA: Diagnosis present

## 2021-01-19 DIAGNOSIS — Z8249 Family history of ischemic heart disease and other diseases of the circulatory system: Secondary | ICD-10-CM

## 2021-01-19 DIAGNOSIS — E039 Hypothyroidism, unspecified: Secondary | ICD-10-CM | POA: Diagnosis present

## 2021-01-19 DIAGNOSIS — Z7901 Long term (current) use of anticoagulants: Secondary | ICD-10-CM

## 2021-01-19 DIAGNOSIS — K449 Diaphragmatic hernia without obstruction or gangrene: Secondary | ICD-10-CM | POA: Diagnosis present

## 2021-01-19 DIAGNOSIS — R778 Other specified abnormalities of plasma proteins: Secondary | ICD-10-CM | POA: Diagnosis present

## 2021-01-19 DIAGNOSIS — Z87891 Personal history of nicotine dependence: Secondary | ICD-10-CM

## 2021-01-19 DIAGNOSIS — R609 Edema, unspecified: Secondary | ICD-10-CM | POA: Diagnosis not present

## 2021-01-19 DIAGNOSIS — J9601 Acute respiratory failure with hypoxia: Secondary | ICD-10-CM | POA: Diagnosis not present

## 2021-01-19 DIAGNOSIS — J90 Pleural effusion, not elsewhere classified: Secondary | ICD-10-CM | POA: Diagnosis not present

## 2021-01-19 DIAGNOSIS — J309 Allergic rhinitis, unspecified: Secondary | ICD-10-CM | POA: Diagnosis present

## 2021-01-19 DIAGNOSIS — F419 Anxiety disorder, unspecified: Secondary | ICD-10-CM | POA: Diagnosis present

## 2021-01-19 DIAGNOSIS — R6520 Severe sepsis without septic shock: Secondary | ICD-10-CM | POA: Diagnosis not present

## 2021-01-19 DIAGNOSIS — Z66 Do not resuscitate: Secondary | ICD-10-CM | POA: Diagnosis not present

## 2021-01-19 DIAGNOSIS — F0390 Unspecified dementia without behavioral disturbance: Secondary | ICD-10-CM | POA: Diagnosis present

## 2021-01-19 DIAGNOSIS — J189 Pneumonia, unspecified organism: Secondary | ICD-10-CM

## 2021-01-19 DIAGNOSIS — Z9049 Acquired absence of other specified parts of digestive tract: Secondary | ICD-10-CM

## 2021-01-19 DIAGNOSIS — I110 Hypertensive heart disease with heart failure: Secondary | ICD-10-CM | POA: Diagnosis present

## 2021-01-19 DIAGNOSIS — I5033 Acute on chronic diastolic (congestive) heart failure: Secondary | ICD-10-CM | POA: Diagnosis present

## 2021-01-19 DIAGNOSIS — I5030 Unspecified diastolic (congestive) heart failure: Secondary | ICD-10-CM | POA: Diagnosis present

## 2021-01-19 DIAGNOSIS — Z880 Allergy status to penicillin: Secondary | ICD-10-CM

## 2021-01-19 DIAGNOSIS — Z882 Allergy status to sulfonamides status: Secondary | ICD-10-CM

## 2021-01-19 DIAGNOSIS — Z79899 Other long term (current) drug therapy: Secondary | ICD-10-CM

## 2021-01-19 LAB — RESP PANEL BY RT-PCR (FLU A&B, COVID) ARPGX2
Influenza A by PCR: NEGATIVE
Influenza B by PCR: NEGATIVE
SARS Coronavirus 2 by RT PCR: NEGATIVE

## 2021-01-19 LAB — CBC WITH DIFFERENTIAL/PLATELET
Abs Immature Granulocytes: 0 10*3/uL (ref 0.00–0.07)
Band Neutrophils: 0 %
Basophils Absolute: 0 10*3/uL (ref 0.0–0.1)
Basophils Relative: 0 %
Blasts: 0 %
Eosinophils Absolute: 0 10*3/uL (ref 0.0–0.5)
Eosinophils Relative: 0 %
HCT: 35.4 % — ABNORMAL LOW (ref 36.0–46.0)
Hemoglobin: 11.3 g/dL — ABNORMAL LOW (ref 12.0–15.0)
Lymphocytes Relative: 1 %
Lymphs Abs: 0.3 10*3/uL — ABNORMAL LOW (ref 0.7–4.0)
MCH: 29.9 pg (ref 26.0–34.0)
MCHC: 31.9 g/dL (ref 30.0–36.0)
MCV: 93.7 fL (ref 80.0–100.0)
Metamyelocytes Relative: 0 %
Monocytes Absolute: 1.5 10*3/uL — ABNORMAL HIGH (ref 0.1–1.0)
Monocytes Relative: 5 %
Myelocytes: 0 %
Neutro Abs: 27.4 10*3/uL — ABNORMAL HIGH (ref 1.7–7.7)
Neutrophils Relative %: 94 %
Other: 0 %
Platelets: 319 10*3/uL (ref 150–400)
Promyelocytes Relative: 0 %
RBC: 3.78 MIL/uL — ABNORMAL LOW (ref 3.87–5.11)
RDW: 15 % (ref 11.5–15.5)
WBC: 29.2 10*3/uL — ABNORMAL HIGH (ref 4.0–10.5)
nRBC: 0 % (ref 0.0–0.2)
nRBC: 0 /100 WBC

## 2021-01-19 LAB — PROCALCITONIN: Procalcitonin: <0.1 ng/mL

## 2021-01-19 LAB — BRAIN NATRIURETIC PEPTIDE
B Natriuretic Peptide: 223.7 pg/mL — ABNORMAL HIGH (ref 0.0–100.0)
B Natriuretic Peptide: 220 pg/mL — ABNORMAL HIGH (ref 0.0–100.0)

## 2021-01-19 LAB — TROPONIN I (HIGH SENSITIVITY)
Troponin I (High Sensitivity): 22 ng/L — ABNORMAL HIGH (ref <18)
Troponin I (High Sensitivity): 18 ng/L — ABNORMAL HIGH (ref <18)

## 2021-01-19 LAB — BASIC METABOLIC PANEL
Anion gap: 7 (ref 5–15)
BUN: 19 mg/dL (ref 8–23)
CO2: 27 mmol/L (ref 22–32)
Calcium: 8.9 mg/dL (ref 8.9–10.3)
Chloride: 97 mmol/L — ABNORMAL LOW (ref 98–111)
Creatinine, Ser: 0.95 mg/dL (ref 0.44–1.00)
GFR, Estimated: 57 mL/min — ABNORMAL LOW (ref >60)
Glucose, Bld: 102 mg/dL — ABNORMAL HIGH (ref 70–99)
Potassium: 4.4 mmol/L (ref 3.5–5.1)
Sodium: 131 mmol/L — ABNORMAL LOW (ref 135–145)

## 2021-01-19 LAB — LACTIC ACID, PLASMA
Lactic Acid, Venous: 1.3 mmol/L (ref 0.5–1.9)
Lactic Acid, Venous: 1.2 mmol/L (ref 0.5–1.9)

## 2021-01-19 MED ORDER — DOXYCYCLINE HYCLATE 100 MG PO TABS
100.0000 mg | ORAL_TABLET | Freq: Two times a day (BID) | ORAL | Status: DC
  Administered 2021-01-19 – 2021-01-20 (×2): 100 mg via ORAL
  Filled 2021-01-19 (×2): qty 1

## 2021-01-19 MED ORDER — LEVOTHYROXINE SODIUM 50 MCG PO TABS
62.5000 ug | ORAL_TABLET | Freq: Every day | ORAL | Status: DC
  Administered 2021-01-20 – 2021-02-06 (×18): 62.5 ug via ORAL
  Filled 2021-01-19 (×18): qty 1

## 2021-01-19 MED ORDER — DOXYCYCLINE HYCLATE 100 MG PO TABS
100.0000 mg | ORAL_TABLET | Freq: Once | ORAL | Status: AC
  Administered 2021-01-19: 100 mg via ORAL
  Filled 2021-01-19: qty 1

## 2021-01-19 MED ORDER — AMIODARONE HCL 200 MG PO TABS
200.0000 mg | ORAL_TABLET | Freq: Every day | ORAL | Status: DC
  Administered 2021-01-20 – 2021-02-06 (×18): 200 mg via ORAL
  Filled 2021-01-19 (×18): qty 1

## 2021-01-19 MED ORDER — ATORVASTATIN CALCIUM 10 MG PO TABS
20.0000 mg | ORAL_TABLET | Freq: Every day | ORAL | Status: DC
  Administered 2021-01-21 – 2021-02-05 (×15): 20 mg via ORAL
  Filled 2021-01-19 (×15): qty 2

## 2021-01-19 MED ORDER — PANTOPRAZOLE SODIUM 40 MG PO TBEC
40.0000 mg | DELAYED_RELEASE_TABLET | Freq: Every day | ORAL | Status: DC
  Administered 2021-01-20 – 2021-02-01 (×13): 40 mg via ORAL
  Filled 2021-01-19 (×13): qty 1

## 2021-01-19 MED ORDER — APIXABAN 2.5 MG PO TABS
2.5000 mg | ORAL_TABLET | Freq: Two times a day (BID) | ORAL | Status: DC
  Administered 2021-01-19 – 2021-02-06 (×36): 2.5 mg via ORAL
  Filled 2021-01-19 (×36): qty 1

## 2021-01-19 MED ORDER — FUROSEMIDE 10 MG/ML IJ SOLN
20.0000 mg | Freq: Once | INTRAMUSCULAR | Status: AC
  Administered 2021-01-19: 20 mg via INTRAVENOUS
  Filled 2021-01-19: qty 2

## 2021-01-19 MED ORDER — DARIFENACIN HYDROBROMIDE ER 7.5 MG PO TB24
7.5000 mg | ORAL_TABLET | Freq: Every day | ORAL | Status: DC
  Administered 2021-01-20 – 2021-02-06 (×18): 7.5 mg via ORAL
  Filled 2021-01-19 (×18): qty 1

## 2021-01-19 MED ORDER — FUROSEMIDE 20 MG PO TABS: 20.0000 mg | ORAL_TABLET | ORAL | Status: DC

## 2021-01-19 MED ORDER — GUAIFENESIN ER 600 MG PO TB12
600.0000 mg | ORAL_TABLET | Freq: Two times a day (BID) | ORAL | Status: DC
  Administered 2021-01-19 – 2021-02-06 (×36): 600 mg via ORAL
  Filled 2021-01-19 (×36): qty 1

## 2021-01-19 MED ORDER — SODIUM CHLORIDE 0.9 % IV SOLN: 2.0000 g | INTRAVENOUS | Status: DC

## 2021-01-19 MED ORDER — POLYETHYLENE GLYCOL 3350 17 G PO PACK
17.0000 g | PACK | Freq: Every day | ORAL | Status: DC
  Administered 2021-01-21 – 2021-02-01 (×10): 17 g via ORAL
  Filled 2021-01-19 (×11): qty 1

## 2021-01-19 MED ORDER — ISOSORBIDE MONONITRATE ER 60 MG PO TB24
60.0000 mg | ORAL_TABLET | Freq: Every day | ORAL | Status: DC
  Administered 2021-01-20 – 2021-02-02 (×14): 60 mg via ORAL
  Filled 2021-01-19 (×13): qty 1
  Filled 2021-01-19: qty 2

## 2021-01-19 MED ORDER — SACCHAROMYCES BOULARDII 250 MG PO CAPS
250.0000 mg | ORAL_CAPSULE | Freq: Two times a day (BID) | ORAL | Status: DC
  Administered 2021-01-20 – 2021-02-06 (×36): 250 mg via ORAL
  Filled 2021-01-19 (×37): qty 1

## 2021-01-19 MED ORDER — IPRATROPIUM-ALBUTEROL 0.5-2.5 (3) MG/3ML IN SOLN
3.0000 mL | Freq: Four times a day (QID) | RESPIRATORY_TRACT | Status: DC
  Administered 2021-01-19 – 2021-01-20 (×4): 3 mL via RESPIRATORY_TRACT
  Filled 2021-01-19 (×4): qty 3

## 2021-01-19 MED ORDER — SODIUM CHLORIDE 0.9 % IV SOLN
1.0000 g | Freq: Once | INTRAVENOUS | Status: AC
  Administered 2021-01-19: 1 g via INTRAVENOUS
  Filled 2021-01-19: qty 10

## 2021-01-19 NOTE — ED Provider Notes (Signed)
[facility] EMERGENCY DEPARTMENT Provider Note   CSN: [id number] Arrival date & time: 01/19/21  1325     History Chief Complaint  Patient presents with   Shortness of Breath         Cathy [name] is a 87 y.o. female.  She has a history of CHF and a flutter.  On 4 L of nasal cannula since last admission.  Was just admitted 6 days ago for pneumonia and is currently back at her facility. Today staff noticed patient was having increased shortness of breath since waking and her saturations were 88% on 4 L.  EMS increased her to 6 L and transported.  She does endorse some chest discomfort rates it as 6 out of 10.  The history is provided by the patient and the EMS personnel.  Shortness of Breath Severity:  Moderate Onset quality:  Gradual Duration:  1 week Timing:  Constant Progression:  Worsening Relieved by:  Oxygen Worsened by:  Nothing Ineffective treatments:  None tried Associated symptoms: chest pain and cough   Associated symptoms: no abdominal pain, no fever, no headaches, no neck pain, no rash, no sore throat and no vomiting       Past Medical History:  Diagnosis Date   Allergic rhinitis    Anemia    Anxiety    Chronic systolic CHF (congestive heart failure) ([place])    a. 2D Echo 02/02/15: EF 25-30%, akinesis of mid-apical anteroseptal myocardium, grade 3 DD, mod MR, severely dilated LA, mildly reduced RV systoluc function, mod dilated RA, mod TR, mildly increased PASP 21mmHg. b. 10/2016: EF 55-60% with no regional WMA. Grade 3 DD.    CLOSTRIDIUM DIFFICILE COLITIS 05/26/2009   Qualifier: Diagnosis of  By: Nils Pyle CMA (AAMA), Leisha     Colitis    lymphocytic colitis feb 2011   Coronary artery calcification    a. By CT 01/2015.   Diverticulosis of colon    DJD (degenerative joint disease)    gets epidural injections   Dysphagia    Hiatal hernia    Hypercholesteremia    Hypertension    Iron deficiency    Irritable bowel syndrome    Liver lesion  02/18/2015   Lumbar back pain    Osteoporosis    Paroxysmal atrial flutter ([place])    a. Dx 01/2015 - underwent DCCV 03/2015; takes Eliquis   Sinus bradycardia    Splenic lesion 02/18/2015   Squamous cell carcinoma in situ    Urinary incontinence    Venous insufficiency    Vitamin B12 deficiency     Patient Active Problem List   Diagnosis Date Noted   Malnutrition of moderate degree 01/09/2021   Acute respiratory failure with hypoxia ([place]) 01/07/2021   Severe sepsis ([place]) 01/07/2021   Acute respiratory distress 01/06/2021   Community acquired pneumonia 01/06/2021   Heart failure with preserved ejection fraction ([place]) 01/06/2021   Hyponatremia 01/06/2021   SIRS (systemic inflammatory response syndrome) ([place]) 01/06/2021   Primary osteoarthritis of both hands 07/06/2019   Hip fracture ([place]) 06/29/2019   Closed fracture of neck of left femur ([place]) 06/28/2019   GERD without esophagitis 06/28/2019   Noninfected skin tear of leg, right, initial encounter 04/19/2018   Bowel obstruction ([place]) 02/21/2018   Hypothyroidism 02/21/2018   Osteoarthritis of left shoulder 08/10/2017   Pain in joint of left shoulder 08/10/2017   Mass of joint of left wrist 06/22/2017   Arthritis of left wrist 06/21/2017   Carpal tunnel syndrome 06/21/2017  Tenosynovitis of left wrist 06/21/2017   Fracture of distal end of humerus 03/25/2017   Closed supracondylar fracture of left humerus 01/15/2017   Gait disorder 01/15/2017   Recurrent falls 01/15/2017   Rib fractures 11/28/2016   Skin lesion 09/30/2016   Ganglion cyst of dorsum of left wrist 09/30/2016   Rotator cuff arthropathy, right 04/26/2016   Hyperglycemia 04/01/2016   Right arm pain 04/01/2016   Wrist swelling 09/30/2015   Preventative health care 04/02/2015   Chronic combined systolic and diastolic CHF (congestive heart failure) ([place]) 03/17/2015   Coronary artery calcification seen on CT scan 03/17/2015   Sinus bradycardia 03/17/2015    Splenic lesion 02/18/2015   Liver lesion 02/18/2015   Coronary artery disease involving native coronary artery of native heart without angina pectoris 02/03/2015   Atrial fibrillation and flutter ([place]) 02/02/2015   Fatigue 01/31/2015   Abnormal CT of the abdomen 01/31/2015   Prolonged Q-T interval on ECG 01/31/2015   Hepatic lesion    Chronic constipation 12/03/2014   Lower extremity edema 04/25/2014   Peripheral neuropathy ([place]) 12/10/2013   Overactive bladder 12/27/2011   Senile osteoporosis 12/27/2011   Vitamin B12 deficiency 05/03/2010   Iron deficiency anemia 05/03/2010   Normocytic anemia 05/03/2010   VENOUS INSUFFICIENCY 01/27/2009   BACK PAIN, LUMBAR 01/27/2008   URINARY INCONTINENCE 08/23/2007   DIVERTICULOSIS OF COLON 04/10/2007   HYPERCHOLESTEROLEMIA 02/13/2007   ANXIETY 02/13/2007   Essential hypertension 02/13/2007   Diaphragmatic hernia 02/13/2007   Irritable bowel syndrome 02/13/2007   DEGENERATIVE JOINT DISEASE 02/13/2007   Allergic rhinitis 02/13/2007    Past Surgical History:  Procedure Laterality Date   ABDOMINAL HYSTERECTOMY     CARDIOVERSION N/A 02/03/2015   Procedure: CARDIOVERSION;  Surgeon: Sueanne Margarita, MD;  Location: [facility];  Service: Cardiovascular;  Laterality: N/A;   CARDIOVERSION N/A 03/10/2015   Procedure: CARDIOVERSION;  Surgeon: Dorothy Spark, MD;  Location: [facility];  Service: Cardiovascular;  Laterality: N/A;   CARDIOVERSION N/A 01/07/2017   Procedure: CARDIOVERSION;  Surgeon: Pixie Casino, MD;  Location: [facility] ENDOSCOPY;  Service: Cardiovascular;  Laterality: N/A;   cataract surgery     decompressive lumbar laminectomy  06/2008   at L2-3, L3-4 and L4-5 by Dr. Ronnald Ramp   HIP PINNING,CANNULATED Left 06/30/2019   Procedure: CANNULATED LEFT HIP PINNING;  Surgeon: Mcarthur Rossetti, MD;  Location: [facility];  Service: Orthopedics;  Laterality: Left;   LAPAROSCOPIC CHOLECYSTECTOMY  04/2009   Dr. Abran Cantor   REPLACEMENT TOTAL KNEE      TEE WITHOUT CARDIOVERSION N/A 02/03/2015   Procedure: TRANSESOPHAGEAL ECHOCARDIOGRAM (TEE);  Surgeon: Sueanne Margarita, MD;  Location: [facility] ENDOSCOPY;  Service: Cardiovascular;  Laterality: N/A;     OB History   No obstetric history on file.     Family History  Problem Relation Age of Onset   Cancer Brother        lung   Diabetes Son    Coronary artery disease Son    Kidney disease Son    Stroke Son    Heart attack Mother    Cancer Brother        colon   Cancer Daughter        cervical, lung    Heart attack Brother    Diabetes Brother    Heart attack Sister    Hypertension Neg Hx    Fainting Neg Hx     Social History   Tobacco Use   Smoking status: Former    Packs/day: 0.50  Years: 10.00    Pack years: 5.00    Types: Cigarettes   Smokeless tobacco: Never  Vaping Use   Vaping Use: Never used  Substance Use Topics   Alcohol use: No   Drug use: Never    Home Medications Prior to Admission medications   Medication Sig Start Date End Date Taking? Authorizing Provider  apixaban (ELIQUIS) 2.5 MG TABS tablet TAKE 1 TABLET TWICE A DAY 01/13/21   Camnitz, Ocie Doyne, MD  acetaminophen (TYLENOL) 325 MG tablet Take 650 mg by mouth every 8 (eight) hours.    [provider]  amiodarone (PACERONE) 200 MG tablet TAKE 1 TABLET DAILY Patient taking differently: Take 200 mg by mouth daily. 10/28/20   Camnitz, Ocie Doyne, MD  atorvastatin (LIPITOR) 20 MG tablet TAKE 1 TABLET DAILY AT 6:00IN THE EVENING Patient taking differently: Take 20 mg by mouth daily at 6 PM. 10/28/20   Camnitz, Ocie Doyne, MD  bacitracin ointment Apply 1 application topically 2 (two) times daily. 10/23/20   Lamptey, Myrene Galas, MD  calcium-vitamin D (OSCAL WITH D) 500-200 MG-UNIT per tablet Take 1 tablet by mouth daily.     [provider]  dextromethorphan-guaiFENesin (MUCINEX DM) 30-600 MG 12hr tablet Take 1 tablet by mouth 2 (two) times daily.    [provider]  esomeprazole  (NEXIUM) 40 MG capsule Take 1 capsule (40 mg total) by mouth daily at 12 noon. 07/23/19   Granville Lewis C, PA-C  furosemide (LASIX) 20 MG tablet Take 1 tablet (20 mg total) by mouth every other day. Patient taking differently: Take 20 mg by mouth daily. 08/26/20   Biagio Borg, MD  ibuprofen (ADVIL) 100 MG tablet Take 200 mg by mouth every 6 (six) hours as needed for fever.    [provider]  isosorbide mononitrate (IMDUR) 60 MG 24 hr tablet TAKE 1 TABLET DAILY Patient taking differently: Take 60 mg by mouth daily. 10/28/20   Camnitz, Ocie Doyne, MD  Misc. Devices [facility]) MISC Use as directed three times daily 08/15/19   Biagio Borg, MD  Phenylephrine-DM-GG-APAP (DELSYM COUGH/COLD DAYTIME) 5-10-200-325 MG/10ML LIQD Take 10 mLs by mouth every 12 (twelve) hours as needed (cough).    [provider]  polyethylene glycol (MIRALAX / GLYCOLAX) 17 g packet Take 17 g by mouth daily. 07/03/19   [provider]  solifenacin (VESICARE) 10 MG tablet Take 1 tablet (10 mg total) by mouth daily. 07/23/19   [place] Lewis C, PA-C  SYNTHROID 125 MCG tablet TAKE 1/2 TABLET DAILY. Patient taking differently: 62.5 mcg daily before breakfast. 04/30/20   Biagio Borg, MD  vitamin B-12 (CYANOCOBALAMIN) 1000 MCG tablet Take 1 tablet (1,000 mcg total) by mouth daily. 06/17/14   Rowe Clack, MD    Allergies    Amoxicillin-pot clavulanate, Penicillins, and Sulfonamide derivatives  Review of Systems   Review of Systems  Constitutional:  Negative for fever.  HENT:  Negative for sore throat.   Eyes:  Negative for visual disturbance.  Respiratory:  Positive for cough and shortness of breath.   Cardiovascular:  Positive for chest pain.  Gastrointestinal:  Negative for abdominal pain and vomiting.  Genitourinary:  Negative for dysuria.  Musculoskeletal:  Negative for neck pain.  Skin:  Negative for rash.  Neurological:  Negative for headaches.   Physical Exam Updated Vital  Signs BP 112/78 (BP Location: Left Arm)   Pulse 76   Temp 98.5 F (36.9 C) (Oral)   Resp (!) 28  Ht 4\' 7"  (1.397 m)   Wt 48.5 kg   SpO2 97%   BMI 24.87 kg/m   Physical Exam Vitals and nursing note reviewed.  Constitutional:      General: She is not in acute distress.    Appearance: She is well-developed and underweight.  HENT:     Head: Normocephalic and atraumatic.  Eyes:     Conjunctiva/sclera: Conjunctivae normal.  Cardiovascular:     Rate and Rhythm: Normal rate and regular rhythm.     Heart sounds: No murmur heard. Pulmonary:     Effort: Pulmonary effort is normal. No respiratory distress.     Breath sounds: Rhonchi present.  Abdominal:     Palpations: Abdomen is soft.     Tenderness: There is no abdominal tenderness. There is no guarding or rebound.  Musculoskeletal:        General: No swelling or deformity. Normal range of motion.     Cervical back: Neck supple.     Right lower leg: No edema.     Left lower leg: No edema.  Skin:    General: Skin is warm and dry.     Capillary Refill: Capillary refill takes less than 2 seconds.  Neurological:     General: No focal deficit present.     Mental Status: She is alert.  Psychiatric:        Mood and Affect: Mood normal.    ED Results / Procedures / Treatments   Labs (all labs ordered are listed, but only abnormal results are displayed) Labs Reviewed  BASIC METABOLIC PANEL - Abnormal; Notable for the following components:      Result Value   Sodium 131 (*)    Chloride 97 (*)    Glucose, Bld 102 (*)    GFR, Estimated 57 (*)    All other components within normal limits  CBC WITH DIFFERENTIAL/PLATELET - Abnormal; Notable for the following components:   WBC 29.2 (*)    RBC 3.78 (*)    Hemoglobin 11.3 (*)    HCT 35.4 (*)    Neutro Abs 27.4 (*)    Lymphs Abs 0.3 (*)    Monocytes Absolute 1.5 (*)    All other components within normal limits  TROPONIN I (HIGH SENSITIVITY) - Abnormal; Notable for the following  components:   Troponin I (High Sensitivity) 22 (*)    All other components within normal limits  RESP PANEL BY RT-PCR (FLU A&B, COVID) ARPGX2  CULTURE, BLOOD (ROUTINE X 2)  CULTURE, BLOOD (ROUTINE X 2)  LACTIC ACID, PLASMA  BRAIN NATRIURETIC PEPTIDE  BRAIN NATRIURETIC PEPTIDE  LACTIC ACID, PLASMA  STREP PNEUMONIAE URINARY ANTIGEN  PROCALCITONIN  TROPONIN I (HIGH SENSITIVITY)    EKG EKG Interpretation  Date/Time:  Monday January 19 2021 13:37:27 EST Ventricular Rate:  83 PR Interval:    QRS Duration: 152 QT Interval:  442 QTC Calculation: 520 R Axis:   -85 Text Interpretation: Atrial flutter Right bundle branch block No significant change since prior 11/22 Confirmed by Aletta Edouard [phone]) on 01/19/2021 1:39:56 PM  Radiology DG Chest Port 1 View  Result Date: 01/19/2021 CLINICAL DATA:  Shortness of breath EXAM: PORTABLE CHEST 1 VIEW COMPARISON:  Chest x-ray 01/09/2021 FINDINGS: Heart is enlarged. Calcified plaques in the aortic arch. Large hiatal hernia again seen which contains bowel. Interval development of diffuse increased interstitial and patchy airspace opacities throughout the right lung and more mildly in the left lung. Small right pleural effusion and possible small left pleural effusion.  No pneumothorax. IMPRESSION: 1. Interval development of extensive pulmonary opacities bilaterally, right greater than left, likely representing pneumonia. 2. Small right pleural effusion and possible small left pleural effusion. 3. Cardiomegaly. 4. Large hiatal hernia. Electronically Signed   By: Ofilia Neas M.D.   On: 01/19/2021 14:30    Procedures Procedures   Medications Ordered in ED Medications  saccharomyces boulardii (FLORASTOR) capsule 250 mg (has no administration in time range)  amiodarone (PACERONE) tablet 200 mg (has no administration in time range)  levothyroxine (SYNTHROID) tablet 62.5 mcg (has no administration in time range)  polyethylene glycol (MIRALAX /  GLYCOLAX) packet 17 g (has no administration in time range)  pantoprazole (PROTONIX) EC tablet 40 mg (has no administration in time range)  apixaban (ELIQUIS) tablet 2.5 mg (has no administration in time range)  guaiFENesin (MUCINEX) 12 hr tablet 600 mg (has no administration in time range)  cefTRIAXone (ROCEPHIN) 2 g in sodium chloride 0.9 % 100 mL IVPB (has no administration in time range)  doxycycline (VIBRA-TABS) tablet 100 mg (has no administration in time range)  ipratropium-albuterol (DUONEB) 0.5-2.5 (3) MG/3ML nebulizer solution 3 mL (3 mLs Nebulization Given 01/19/21 1639)  cefTRIAXone (ROCEPHIN) 1 g in sodium chloride 0.9 % 100 mL IVPB (0 g Intravenous Stopped 01/19/21 1539)  doxycycline (VIBRA-TABS) tablet 100 mg (100 mg Oral Given 01/19/21 1508)    ED Course  I have reviewed the triage vital signs and the nursing notes.  Pertinent labs & imaging results that were available during my care of the patient were reviewed by me and considered in my medical decision making (see chart for details).  Clinical Course as of 01/19/21 1730  Mon Jan 19, 2021  1440 Chest x-ray interpreted by me as scoliosis kyphosis with worsening infiltrates bilaterally.  Waiting radiology reading. [MB]    Clinical Course User Index [MB] Hayden Rasmussen, MD   MDM Rules/Calculators/A&P                          Cathy [name] was evaluated in Emergency Department on 01/19/2021 for the symptoms described in the history of present illness. She was evaluated in the context of the global COVID-19 pandemic, which necessitated consideration that the patient might be at risk for infection with the SARS-CoV-2 virus that causes COVID-19. Institutional protocols and algorithms that pertain to the evaluation of patients at risk for COVID-19 are in a state of rapid change based on information released by regulatory bodies including the CDC and federal and state organizations. These policies and algorithms were followed  during the patient's care in the ED.  This patient complains of increased shortness of breath, chest pain; this involves an extensive number of treatment Options and is a complaint that carries with it a high risk of complications and Morbidity. The differential includes CHF, COPD, pneumonia, pneumothorax, ACS, PE, COVID, flu  I ordered, reviewed and interpreted labs, which included CBC with elevated white count from discharge, stable hemoglobin, chemistries fairly unremarkable other than low sodium, troponins mildly elevated I ordered medication IV antibiotics I ordered imaging studies which included chest x-ray and I independently    visualized and interpreted imaging which showed marked worsening of pneumonia Additional history obtained from EMS and patient's daughter Previous records obtained and reviewed from epic including prior admission and discharge summaries  After the interventions stated above, I reevaluated the patient and found patient still to be requiring oxygen and having moderate work of breathing.  Her  care is signed out to oncoming provider Dr. Sherry Ruffing to follow-up on results of labs and to get the patient admitted back to the hospitalist service.   Final Clinical Impression(s) / ED Diagnoses Final diagnoses:  Hypoxia  Shortness of breath  Community acquired pneumonia, unspecified laterality    Rx / DC Orders ED Discharge Orders     None        Hayden Rasmussen, MD 01/19/21 1732

## 2021-01-19 NOTE — H&P (Addendum)
History and Physical    CHARLIEGH VASUDEVAN TLX:[id number] DOB: 1927-07-03 DOA: 01/19/2021  Referring MD/NP/PA: Dala Dock, MD PCP: Biagio Borg, MD  Patient coming from: [facility] via EMS  Chief Complaint: Cough and Shortness of breath I have personally briefly reviewed patient's old medical records in [place]   HPI: Cathy [name] is a 87 y.o. female with medical history significant of HTN, HLD, HFpEF, paroxysmal atrial flutter, hiatal hernia, and osteoarthritis who presented with complaints of cough and shortness of breath.  Just recently hospitalized from 11/8-11/14, with acute hypoxic respiratory failure due to sepsis secondary to community-acquired pneumonia with rhinovirus infection.  She had been treated with antibiotics with improvement in respiratory status.  She had been discharged back to the facility on oxygen 3L.  Patient reports that she has had a continued productive cough for which she has been unable to rest over the last few days.  Staff noticed that patient's O2 saturations were as low as 88% all on 4 L this morning and that she was had increased work of breathing with complaints of chest discomfort.  Patient admits that she has been coughing so much that at times she is intermittently gotten choked up.  Other associated symptoms include leg swelling.  Denies having any significant fever, nausea, vomiting, or diarrhea.  Patient had just recently been hospitalized for acute on chronic respiratory failure with hypoxia secondary to pneumonia and also be positive for rhinovirus and has been treated with 6 days of IV antibiotics.  ED Course: Upon admission into the emergency department patient was seen to be afebrile with respirations 19-28, and O2 saturation currently maintained on 6 L nasal cannula oxygen.  Labs significant for WBC 29.2, hemoglobin 11.3, sodium 131, BUN 19, creatinine 0.9, troponin 22, BNP 223.7, and lactic acid 1.3.  Chest x-ray noted interval  development of extensive pulmonary opacities bilaterally right greater than left likely representing pneumonia with small right pleural effusion,  possible small left pleural effusion, cardiomegaly, and large hiatal hernia.  Influenza and COVID-19 screening was negative.   Review of Systems  Constitutional:  Positive for malaise/fatigue. Negative for fever.  HENT:  Positive for congestion.   Eyes:  Negative for photophobia and pain.  Respiratory:  Positive for cough, sputum production and shortness of breath.   Cardiovascular:  Positive for chest pain and leg swelling.  Gastrointestinal:  Negative for nausea and vomiting.  Genitourinary:  Negative for dysuria.  Musculoskeletal:  Negative for falls.  Neurological:  Negative for focal weakness and loss of consciousness.  Psychiatric/Behavioral:  The patient has insomnia.    Past Medical History:  Diagnosis Date   Allergic rhinitis    Anemia    Anxiety    Chronic systolic CHF (congestive heart failure) ([place])    a. 2D Echo 02/02/15: EF 25-30%, akinesis of mid-apical anteroseptal myocardium, grade 3 DD, mod MR, severely dilated LA, mildly reduced RV systoluc function, mod dilated RA, mod TR, mildly increased PASP 30mmHg. b. 10/2016: EF 55-60% with no regional WMA. Grade 3 DD.    CLOSTRIDIUM DIFFICILE COLITIS 05/26/2009   Qualifier: Diagnosis of  By: Nils Pyle CMA (AAMA), Leisha     Colitis    lymphocytic colitis feb 2011   Coronary artery calcification    a. By CT 01/2015.   Diverticulosis of colon    DJD (degenerative joint disease)    gets epidural injections   Dysphagia    Hiatal hernia    Hypercholesteremia    Hypertension  Iron deficiency    Irritable bowel syndrome    Liver lesion 02/18/2015   Lumbar back pain    Osteoporosis    Paroxysmal atrial flutter ([place])    a. Dx 01/2015 - underwent DCCV 03/2015; takes Eliquis   Sinus bradycardia    Splenic lesion 02/18/2015   Squamous cell carcinoma in situ    Urinary incontinence     Venous insufficiency    Vitamin B12 deficiency     Past Surgical History:  Procedure Laterality Date   ABDOMINAL HYSTERECTOMY     CARDIOVERSION N/A 02/03/2015   Procedure: CARDIOVERSION;  Surgeon: Sueanne Margarita, MD;  Location: [place] [facility];  Service: Cardiovascular;  Laterality: N/A;   CARDIOVERSION N/A 03/10/2015   Procedure: CARDIOVERSION;  Surgeon: Dorothy Spark, MD;  Location: [place];  Service: Cardiovascular;  Laterality: N/A;   CARDIOVERSION N/A 01/07/2017   Procedure: CARDIOVERSION;  Surgeon: Pixie Casino, MD;  Location: [facility] ENDOSCOPY;  Service: Cardiovascular;  Laterality: N/A;   cataract surgery     decompressive lumbar laminectomy  06/2008   at L2-3, L3-4 and L4-5 by Dr. Ronnald Ramp   HIP PINNING,CANNULATED Left 06/30/2019   Procedure: CANNULATED LEFT HIP PINNING;  Surgeon: Mcarthur Rossetti, MD;  Location: [facility];  Service: Orthopedics;  Laterality: Left;   LAPAROSCOPIC CHOLECYSTECTOMY  04/2009   Dr. Abran Cantor   REPLACEMENT TOTAL KNEE     TEE WITHOUT CARDIOVERSION N/A 02/03/2015   Procedure: TRANSESOPHAGEAL ECHOCARDIOGRAM (TEE);  Surgeon: Sueanne Margarita, MD;  Location: [facility] ENDOSCOPY;  Service: Cardiovascular;  Laterality: N/A;     reports that she has quit smoking. Her smoking use included cigarettes. She has a 5.00 pack-year smoking history. She has never used smokeless tobacco. She reports that she does not drink alcohol and does not use drugs.  Allergies  Allergen Reactions   Amoxicillin-Pot Clavulanate Diarrhea    Has patient had a PCN reaction causing immediate rash, facial/tongue/throat swelling, SOB or lightheadedness with hypotension: no Has patient had a PCN reaction causing severe rash involving mucus membranes or skin necrosis:  no Has patient had a PCN reaction that required hospitalization: no Has patient had a PCN reaction occurring within the last 10 years no If all of the above answers are "NO", then may proceed with Cephalosporin use.     Penicillins Diarrhea   Sulfonamide Derivatives Nausea Only    Family History  Problem Relation Age of Onset   Cancer Brother        lung   Diabetes Son    Coronary artery disease Son    Kidney disease Son    Stroke Son    Heart attack Mother    Cancer Brother        colon   Cancer Daughter        cervical, lung    Heart attack Brother    Diabetes Brother    Heart attack Sister    Hypertension Neg Hx    Fainting Neg Hx     Prior to Admission medications   Medication Sig Start Date End Date Taking? Authorizing Provider  acetaminophen (TYLENOL) 325 MG tablet Take 650 mg by mouth every 8 (eight) hours. Scheduled   Yes [provider]  Acetaminophen-DM (DELSYM CHILD COUGH+SORE THROAT) 325-10 MG/10ML LIQD Take 10 mLs by mouth every 12 (twelve) hours as needed (cough).   Yes [provider]  amiodarone (PACERONE) 200 MG tablet TAKE 1 TABLET DAILY Patient taking differently: Take 200 mg by mouth daily. 10/28/20  Yes Camnitz,  Will Hassell Done, MD  apixaban (ELIQUIS) 2.5 MG TABS tablet TAKE 1 TABLET TWICE A DAY Patient taking differently: Take 2.5 mg by mouth 2 (two) times daily. 01/13/21  Yes Camnitz, Will Hassell Done, MD  atorvastatin (LIPITOR) 20 MG tablet TAKE 1 TABLET DAILY AT 6:00IN THE EVENING Patient taking differently: Take 20 mg by mouth daily at 6 PM. 10/28/20  Yes Camnitz, Will Hassell Done, MD  calcium-vitamin D (OSCAL WITH D) 500-200 MG-UNIT per tablet Take 1 tablet by mouth daily.    Yes [provider]  dextromethorphan-guaiFENesin (MUCINEX DM) 30-600 MG 12hr tablet Take 1 tablet by mouth 2 (two) times daily.   Yes [provider]  esomeprazole (NEXIUM) 40 MG capsule Take 1 capsule (40 mg total) by mouth daily at 12 noon. 07/23/19  Yes Lassen, Arlo C, PA-C  furosemide (LASIX) 20 MG tablet Take 1 tablet (20 mg total) by mouth every other day. 08/26/20  Yes Biagio Borg, MD  ibuprofen (ADVIL) 200 MG tablet Take 200 mg by mouth every 6 (six) hours as needed  for fever.   Yes [provider]  isosorbide mononitrate (IMDUR) 60 MG 24 hr tablet TAKE 1 TABLET DAILY Patient taking differently: Take 60 mg by mouth daily. 10/28/20  Yes Camnitz, Will Hassell Done, MD  levofloxacin (LEVAQUIN) 500 MG tablet Take 500 mg by mouth daily. For 7 days   Yes [provider]  Nutritional Supplements (ENSURE CLEAR PO) Take 120 mLs by mouth in the morning and at bedtime. With lunch and dinner   Yes [provider]  OXYGEN Inhale 3 L into the lungs continuous.   Yes [provider]  polyethylene glycol (MIRALAX / GLYCOLAX) 17 g packet Take 17 g by mouth daily. 07/03/19  Yes [provider]  solifenacin (VESICARE) 10 MG tablet Take 1 tablet (10 mg total) by mouth daily. 07/23/19  Yes Lassen, Arlo C, PA-C  SYNTHROID 125 MCG tablet TAKE 1/2 TABLET DAILY. Patient taking differently: 62.5 mcg daily before breakfast. 04/30/20  Yes Biagio Borg, MD  vitamin B-12 (CYANOCOBALAMIN) 1000 MCG tablet Take 1 tablet (1,000 mcg total) by mouth daily. 06/17/14  Yes Rowe Clack, MD  bacitracin ointment Apply 1 application topically 2 (two) times daily. Patient not taking: Reported on 01/19/2021 10/23/20   Chase Picket, MD  Misc. Devices (TRANSPORT [place]) MISC Use as directed three times daily 08/15/19   Biagio Borg, MD    Physical Exam:  Constitutional: Elderly female who appears ill and coughing Vitals:   01/19/21 1340 01/19/21 1343 01/19/21 1400 01/19/21 1500  BP: 112/78 102/72 111/80 110/62  Pulse: 76 96 86 89  Resp: (!) 28 19 20 20   Temp: 98.5 F (36.9 C)     TempSrc: Oral     SpO2: 97% 94% 95% 95%  Weight:      Height:       Eyes: PERRL, lids and conjunctivae normal ENMT: Mucous membranes are moist. Posterior pharynx clear of any exudate or lesions Neck: normal, supple, no masses, no thyromegaly Respiratory: Rhonchi present in both lung fields worse on the right.  Currently on high flow nasal cannula oxygen at  11L. Cardiovascular: Regular rate and rhythm, no murmurs / rubs / gallops.  +1 pitting lower extremity edema. 2+ pedal pulses. No carotid bruits.  Abdomen: no tenderness, no masses palpated. No hepatosplenomegaly. Bowel sounds positive.  Musculoskeletal: no clubbing / cyanosis. No joint deformity upper and lower extremities. Good ROM, no contractures. Normal muscle tone.  Skin: no rashes,  lesions, ulcers. No induration Neurologic: CN 2-12 grossly intact.  Able to move all extremities Psychiatric: Normal judgment and insight. Alert and oriented x 3. .     Labs on Admission: I have personally reviewed following labs and imaging studies  CBC: Recent Labs  Lab 01/19/21 1355  WBC 29.2*  NEUTROABS PENDING  HGB 11.3*  HCT 35.4*  MCV 93.7  PLT 416   Basic Metabolic Panel: Recent Labs  Lab 01/19/21 1355  NA 131*  K 4.4  CL 97*  CO2 27  GLUCOSE 102*  BUN 19  CREATININE 0.95  CALCIUM 8.9   GFR: Estimated Creatinine Clearance: 24.2 mL/min (by C-G formula based on SCr of 0.95 mg/dL). Liver Function Tests: No results for input(s): AST, ALT, ALKPHOS, BILITOT, PROT, ALBUMIN in the last 168 hours. No results for input(s): LIPASE, AMYLASE in the last 168 hours. No results for input(s): AMMONIA in the last 168 hours. Coagulation Profile: No results for input(s): INR, PROTIME in the last 168 hours. Cardiac Enzymes: No results for input(s): CKTOTAL, CKMB, CKMBINDEX, TROPONINI in the last 168 hours. BNP (last 3 results) No results for input(s): PROBNP in the last 8760 hours. HbA1C: No results for input(s): HGBA1C in the last 72 hours. CBG: No results for input(s): GLUCAP in the last 168 hours. Lipid Profile: No results for input(s): CHOL, HDL, LDLCALC, TRIG, CHOLHDL, LDLDIRECT in the last 72 hours. Thyroid Function Tests: No results for input(s): TSH, T4TOTAL, FREET4, T3FREE, THYROIDAB in the last 72 hours. Anemia Panel: No results for input(s): VITAMINB12, FOLATE, FERRITIN, TIBC,  IRON, RETICCTPCT in the last 72 hours. Urine analysis:    Component Value Date/Time   COLORURINE YELLOW 01/06/2021 1957   APPEARANCEUR CLEAR 01/06/2021 1957   LABSPEC 1.012 01/06/2021 1957   PHURINE 5.0 01/06/2021 1957   GLUCOSEU NEGATIVE 01/06/2021 1957   GLUCOSEU NEGATIVE 04/11/2017 1536   HGBUR MODERATE (A) 01/06/2021 1957   BILIRUBINUR NEGATIVE 01/06/2021 1957   BILIRUBINUR neg 11/18/2016 1520   KETONESUR NEGATIVE 01/06/2021 1957   PROTEINUR NEGATIVE 01/06/2021 1957   UROBILINOGEN 0.2 04/11/2017 1536   NITRITE NEGATIVE 01/06/2021 1957   LEUKOCYTESUR LARGE (A) 01/06/2021 1957   Sepsis Labs: Recent Results (from the past 240 hour(s))  Resp Panel by RT-PCR (Flu A&B, Covid) Nasopharyngeal Swab     Status: None   Collection Time: 01/12/21 12:53 PM   Specimen: Nasopharyngeal Swab; Nasopharyngeal(NP) swabs in vial transport medium  Result Value Ref Range Status   SARS Coronavirus 2 by RT PCR NEGATIVE NEGATIVE Final    Comment: (NOTE) SARS-CoV-2 target nucleic acids are NOT DETECTED.  The SARS-CoV-2 RNA is generally detectable in upper respiratory specimens during the acute phase of infection. The lowest concentration of SARS-CoV-2 viral copies this assay can detect is 138 copies/mL. A negative result does not preclude SARS-Cov-2 infection and should not be used as the sole basis for treatment or other patient management decisions. A negative result may occur with  improper specimen collection/handling, submission of specimen other than nasopharyngeal swab, presence of viral mutation(s) within the areas targeted by this assay, and inadequate number of viral copies(<138 copies/mL). A negative result must be combined with clinical observations, patient history, and epidemiological information. The expected result is Negative.  Fact Sheet for Patients:  EntrepreneurPulse.com.au  Fact Sheet for Healthcare Providers:   IncredibleEmployment.be  This test is no t yet approved or cleared by the [place] FDA and  has been authorized for detection and/or diagnosis of SARS-CoV-2 by FDA under an Emergency Use  Authorization (EUA). This EUA will remain  in effect (meaning this test can be used) for the duration of the COVID-19 declaration under Section 564(b)(1) of the Act, 21 U.S.C.section 360bbb-3(b)(1), unless the authorization is terminated  or revoked sooner.       Influenza A by PCR NEGATIVE NEGATIVE Final   Influenza B by PCR NEGATIVE NEGATIVE Final    Comment: (NOTE) The Xpert Xpress SARS-CoV-2/FLU/RSV plus assay is intended as an aid in the diagnosis of influenza from Nasopharyngeal swab specimens and should not be used as a sole basis for treatment. Nasal washings and aspirates are unacceptable for Xpert Xpress SARS-CoV-2/FLU/RSV testing.  Fact Sheet for Patients: EntrepreneurPulse.com.au  Fact Sheet for Healthcare Providers: IncredibleEmployment.be  This test is not yet approved or cleared by the [place] FDA and has been authorized for detection and/or diagnosis of SARS-CoV-2 by FDA under an Emergency Use Authorization (EUA). This EUA will remain in effect (meaning this test can be used) for the duration of the COVID-19 declaration under Section 564(b)(1) of the Act, 21 U.S.C. section 360bbb-3(b)(1), unless the authorization is terminated or revoked.  Performed at [facility], [place] [address] [place], [place] [address]   Resp Panel by RT-PCR (Flu A&B, Covid)     Status: None   Collection Time: 01/19/21  2:00 PM   Specimen: Nasopharyngeal(NP) swabs in vial transport medium  Result Value Ref Range Status   SARS Coronavirus 2 by RT PCR NEGATIVE NEGATIVE Final    Comment: (NOTE) SARS-CoV-2 target nucleic acids are NOT DETECTED.  The SARS-CoV-2 RNA is generally detectable in upper respiratory specimens during the  acute phase of infection. The lowest concentration of SARS-CoV-2 viral copies this assay can detect is 138 copies/mL. A negative result does not preclude SARS-Cov-2 infection and should not be used as the sole basis for treatment or other patient management decisions. A negative result may occur with  improper specimen collection/handling, submission of specimen other than nasopharyngeal swab, presence of viral mutation(s) within the areas targeted by this assay, and inadequate number of viral copies(<138 copies/mL). A negative result must be combined with clinical observations, patient history, and epidemiological information. The expected result is Negative.  Fact Sheet for Patients:  EntrepreneurPulse.com.au  Fact Sheet for Healthcare Providers:  IncredibleEmployment.be  This test is no t yet approved or cleared by the [place] FDA and  has been authorized for detection and/or diagnosis of SARS-CoV-2 by FDA under an Emergency Use Authorization (EUA). This EUA will remain  in effect (meaning this test can be used) for the duration of the COVID-19 declaration under Section 564(b)(1) of the Act, 21 U.S.C.section 360bbb-3(b)(1), unless the authorization is terminated  or revoked sooner.       Influenza A by PCR NEGATIVE NEGATIVE Final   Influenza B by PCR NEGATIVE NEGATIVE Final    Comment: (NOTE) The Xpert Xpress SARS-CoV-2/FLU/RSV plus assay is intended as an aid in the diagnosis of influenza from Nasopharyngeal swab specimens and should not be used as a sole basis for treatment. Nasal washings and aspirates are unacceptable for Xpert Xpress SARS-CoV-2/FLU/RSV testing.  Fact Sheet for Patients: EntrepreneurPulse.com.au  Fact Sheet for Healthcare Providers: IncredibleEmployment.be  This test is not yet approved or cleared by the [place] FDA and has been authorized for detection and/or  diagnosis of SARS-CoV-2 by FDA under an Emergency Use Authorization (EUA). This EUA will remain in effect (meaning this test can be used) for the duration of the COVID-19 declaration under Section 564(b)(1) of  the Act, 21 U.S.C. section 360bbb-3(b)(1), unless the authorization is terminated or revoked.  Performed at [facility], [place] [address], [place], [place] [address]      Radiological Exams on Admission: DG Chest Port 1 View  Result Date: 01/19/2021 CLINICAL DATA:  Shortness of breath EXAM: PORTABLE CHEST 1 VIEW COMPARISON:  Chest x-ray 01/09/2021 FINDINGS: Heart is enlarged. Calcified plaques in the aortic arch. Large hiatal hernia again seen which contains bowel. Interval development of diffuse increased interstitial and patchy airspace opacities throughout the right lung and more mildly in the left lung. Small right pleural effusion and possible small left pleural effusion. No pneumothorax. IMPRESSION: 1. Interval development of extensive pulmonary opacities bilaterally, right greater than left, likely representing pneumonia. 2. Small right pleural effusion and possible small left pleural effusion. 3. Cardiomegaly. 4. Large hiatal hernia. Electronically Signed   By: Ofilia Neas M.D.   On: 01/19/2021 14:30    EKG: Independently reviewed.  Atrial fibrillation at 83 bpm with QTC 520  Assessment/Plan Acute on chronic respiratory failure with hypoxia secondary to sepsis due to pneumonia: Patient presents with complaints of shortness of breath and cough after being found to be hypoxic down to 88% on home 4 L of nasal cannula oxygen.  Noted to tachypneic with WBC elevated 29.2 meeting SIRS criteria.  She was recently hospitalized and treated for community-acquired pneumonia.  Chest x-ray showing worsening pulmonary opacities worse on the right than the left.  Given history question the possibility of aspiration.  O2 saturation currently maintained 93% on high flow nasal cannula  oxygen and 11L. -Admit to a progressive bed -Continuous pulse oximetry with nasal cannula oxygen to maintain O2 saturations greater than 92% -Aspiration precautions with elevation of the head of bed at least 30 degrees -Follow-up blood culture -Check procalcitonin  -Continue empiric antibiotics of Rocephin and doxycycline -Chest physiotherapy -Mucinex -Speech therapy consulted for swallow evaluation -Recheck CBC tomorrow morning  Elevated troponin:High-sensitivity troponin 22->18 which appears similar to previous hospitalization.  Suspect secondary to demand. -Continue to monitor  Heart failure with preserved EF: Suspect acute on chronic.  Patient was noted to have 1+ pitting edema on the lower extremities.  BNP was lower at 223.7.  Last echocardiogram was noted to be 55-60% with grade 3 diastolic dysfunction in 5361.  Home medication regimen includes Lasix 20 mg every other day. -Strict intake and output -Continue home Lasix regimen  Atrial fibrillation/flutter on chronic anticoagulation: Patient appears to be rate controlled. -Continue amiodarone and Eliquis  Hyponatremia: Chronic.  Sodium 131 which appears near patient's baseline.  Essential hypertension -Continue home regimen as tolerated  Normocytic anemia: Hemoglobin 11.3 which appears slightly improved from previous. -Continue to monitor  Hypothyroidism: TSH last noted to be 2.214 on 11/8. -Continue levothyroxine  Prolonged QT interval: QTC 520 -Avoid further QT prolonging medications  Hyperlipidemia -Continue atorvastatin  GERD  hiatal hernia  DVT prophylaxis: Eliquis Code Status: Full Disposition Plan: Likely discharge back to [facility] once medically stable Consults called: None Admission status: Inpatient require more than 2 midnight stay  Norval Morton MD [facility]   If 7PM-7AM, please contact night-coverage   01/19/2021, 3:53 PM

## 2021-01-19 NOTE — ED Provider Notes (Signed)
3:18 PM Care assumed from Dr. Melina Copa.  At time of transfer care, patient is awaiting for results of labs prior to readmission for recurrent and worsening pneumonia.  Patient is already received antibiotics and is on increased amount of oxygen and she was at discharge several days ago.  Work-up returned and lactic acid is not elevated but she does have a large white count.  X-ray shows multifocal pneumonia now.  Medicine team called for admission for recurrent and worsening pneumonia.  Clinical Impression: 1. Hypoxia   2. Shortness of breath   3. Community acquired pneumonia, unspecified laterality     Disposition: Admit  This note was prepared with assistance of Systems analyst. Occasional wrong-word or sound-a-like substitutions may have occurred due to the inherent limitations of voice recognition software.     Fynlee Rowlands, Gwenyth Allegra, MD 01/19/21 [phone]

## 2021-01-19 NOTE — ED Triage Notes (Signed)
Pt BIB [facility] from [facility]. Pt dx with pneumonia on 11/15. Today pt was hypoxic at 88% at facility on 4L via N/C. EMS increased O2 to 6L and pt had some relief of dyspnea and SpO2 increased to 95%.   EMS V/S 98/50 HR 79 R18 Temp 97.2

## 2021-01-20 ENCOUNTER — Other Ambulatory Visit (HOSPITAL_COMMUNITY): Payer: Medicare Other

## 2021-01-20 ENCOUNTER — Other Ambulatory Visit: Payer: Medicare Other

## 2021-01-20 ENCOUNTER — Encounter (HOSPITAL_COMMUNITY): Payer: Self-pay | Admitting: Internal Medicine

## 2021-01-20 DIAGNOSIS — J690 Pneumonitis due to inhalation of food and vomit: Secondary | ICD-10-CM | POA: Diagnosis not present

## 2021-01-20 DIAGNOSIS — J9621 Acute and chronic respiratory failure with hypoxia: Secondary | ICD-10-CM | POA: Diagnosis not present

## 2021-01-20 DIAGNOSIS — I5032 Chronic diastolic (congestive) heart failure: Secondary | ICD-10-CM | POA: Diagnosis not present

## 2021-01-20 DIAGNOSIS — E039 Hypothyroidism, unspecified: Secondary | ICD-10-CM | POA: Diagnosis not present

## 2021-01-20 LAB — MRSA NEXT GEN BY PCR, NASAL: MRSA by PCR Next Gen: NOT DETECTED

## 2021-01-20 LAB — STREP PNEUMONIAE URINARY ANTIGEN: Strep Pneumo Urinary Antigen: NEGATIVE

## 2021-01-20 LAB — GLUCOSE, CAPILLARY: Glucose-Capillary: 128 mg/dL — ABNORMAL HIGH (ref 70–99)

## 2021-01-20 MED ORDER — FLUTICASONE PROPIONATE 50 MCG/ACT NA SUSP
2.0000 | Freq: Two times a day (BID) | NASAL | Status: DC
Start: 2021-01-20 — End: 2021-02-06
  Administered 2021-01-20 – 2021-02-03 (×26): 2 via NASAL
  Filled 2021-01-20 (×2): qty 16

## 2021-01-20 MED ORDER — IPRATROPIUM-ALBUTEROL 0.5-2.5 (3) MG/3ML IN SOLN
3.0000 mL | RESPIRATORY_TRACT | Status: DC | PRN
  Administered 2021-01-24: 3 mL via RESPIRATORY_TRACT

## 2021-01-20 MED ORDER — SODIUM CHLORIDE 0.9 % IV SOLN
1.5000 g | Freq: Two times a day (BID) | INTRAVENOUS | Status: AC
  Administered 2021-01-20 – 2021-01-27 (×14): 1.5 g via INTRAVENOUS
  Filled 2021-01-20 (×15): qty 4

## 2021-01-20 MED ORDER — IPRATROPIUM-ALBUTEROL 0.5-2.5 (3) MG/3ML IN SOLN
3.0000 mL | Freq: Four times a day (QID) | RESPIRATORY_TRACT | Status: DC
  Administered 2021-01-20: 3 mL via RESPIRATORY_TRACT
  Filled 2021-01-20: qty 3

## 2021-01-20 MED ORDER — DEXTROSE-NACL 5-0.9 % IV SOLN
INTRAVENOUS | Status: DC
Start: 2021-01-20 — End: 2021-01-21

## 2021-01-20 NOTE — Progress Notes (Addendum)
I spoke with patient's son, daughter and daughter in law (at the bedside), about patient's conditioned and severity of respiratory failure. High risk for decompensation and worsening respiratory failure. Considering her advance directives and prognosis, decision was made to change code status to DNR/ DNI

## 2021-01-20 NOTE — Progress Notes (Signed)
SLP Cancellation Note  Patient Details Name: Cathy [name] MRN: [id number] DOB: 1927-04-23   Cancelled treatment:       Reason Eval/Treat Not Completed: Patient not medically ready. Pt on BiPAP.    Shelah Heatley, Katherene Ponto 01/20/2021, 10:20 AM

## 2021-01-20 NOTE — Progress Notes (Signed)
PROGRESS NOTE    Cathy [name]  NGE:[id number] DOB: [birth year] DOA: 01/19/2021 PCP: Biagio Borg, MD    Brief Narrative:  Cathy [name] was admitted to the hospital with acute hypoxemic respiratory failure due to aspiration multilobar pneumonia complicated with sepsis (present on admission).   87 year old female past medical history for hypertension, dyslipidemia, heart failure, paroxysmal atrial flutter, hiatal hernia and osteoarthritis who presented with cough and dyspnea.  Recent hospitalization 11/8-11/14 for acute hypoxic respiratory failure due to pneumonia and rhinovirus infection.  She received antibiotic therapy and was discharged to skilled nursing facility on supplemental oxygen 3 L/min. At the facility patient continued to have cough and unable to rest, on the day of hospitalization her oxygenation was 88% on 4 L of supplemental oxygen per nasal cannula,and she was noted to have increased work of breathing.  Because of worsening symptoms he was transported to the emergency. On her initial physical examination her respiratory rate was 19-28, oxygen saturation 94% on 11 L/min per supplemental oxygen per nasal cannula, blood pressure 112/78, heart rate 76, temperature 98.5, her lungs had rhonchi bilaterally more right than left, heart S1-S2, present, rhythmic, abdomen soft, positive 1+ pitting bilateral extremity edema.   Sodium 131, potassium 4.4, chloride 97, bicarb 27, glucose 102, BUN 19, creatinine 0.95, BNP 223, highly sensitive troponin 22-18, white count 29.2, hemoglobin 9.3, hematocrit 35.4, platelets 319. SARS COVID-19 negative.  Chest radiograph with hyperinflation, NEW interstitial/alveolar infiltrate right upper lobe and left lower lobe, small right pleural effusion.  EKG 83 bpm, left axis deviation, right bundle branch block, prolonged QTC 520, sinus with sinus arrhythmia, poor R wave progression, LVH, J-point elevation in V3-V4, no significant T wave changes.   Patient  has been placed on IV antibiotic therapy. Worsening respiratory distress and now requiring non invasive mechanical ventilation   Assessment & Plan:   Principal Problem:   Acute on chronic respiratory failure with hypoxemia (HCC) Active Problems:   Normocytic anemia   Prolonged Q-T interval on ECG   Atrial fibrillation and flutter (HCC)   Hypothyroidism   Heart failure with preserved ejection fraction (HCC)   Aspiration pneumonia (HCC)   Acute hypoxemic respiratory failure due to multifocal pneumonia, right upper lobe and left lower lobe, possible aspiration. Complicated with sepsis present on admission.  Patient now on non invasive mechanical ventilation with improvement in work of breathing.  On 50% Fi02.   Plan to continue broad spectrum antibiotic therapy with Unasyn. Considering pattern of infiltrates and recent hospitalization for viral pneumonia due to rhinovirus I suspect aspiration pneumonia.  Continue Bipap support, keep oxygen saturation 88% or greater.  Bronchodilator therapy and aspiration precautions Dysphagia diet for now and will request speech therapy evaluation.   2. Chronic diastolic heart failure.  No clinical signs of heart failure decompensation, continue blood pressure monitoring. Hold on diuretic therapy for now.   3. Atrial flutter (paroxysmal)/ right bundle branch block, prolonged Qtc. Currently patient is rate control, continue amiodarone for rate control and anticoagulation with apixaban   4. HTN. Continue blood pressure monitoring. Continue with isosorbide.   5. Hypothyroid. Continue with levothyroxine.   6. Dyslipidemia. Continue with atorvastatin.   7. Anemia of chronic disease. Hgb and hct have been stable.   8. Hypolemic hyponatremia.  Renal function with serum cr at 0,95, with K at 4,4 and Na 131, bicarb 27 and Cl 97. Continue volume repletion with isotonic saline at 50 ml per hr  Patient continue to be at high risk for worsening  respiratory  failure   Status is: Inpatient  Remains inpatient appropriate because: respiratory failure   DVT prophylaxis: Apixaban   Code Status:    full  Family Communication:  I spoke with patient's daughter in law at the bedside, we talked in detail about patient's condition, plan of care and prognosis and all questions were addressed. I explained patient in high risk of further deterioration, she will call direct family for a meeting to address advance directives including code status.     Antimicrobials:  Unasyn     Subjective: Patient very weak and deconditioned, she is somnolent but easy to arouse to touch and voice. Following simple commands and answering simple questions, she has a full face mask for Bipap. Most information from her family at the bedside   Objective: Vitals:   01/20/21 0530 01/20/21 0800 01/20/21 1100 01/20/21 1300  BP: 123/76 109/70 101/67 (!) 121/59  Pulse: 98 66 73 70  Resp: (!) 24 (!) 25 (!) 23 (!) 21  Temp:    98.5 F (36.9 C)  TempSrc:    Axillary  SpO2: 91% 92% 99% 97%  Weight:      Height:        Intake/Output Summary (Last 24 hours) at 01/20/2021 1309 Last data filed at 01/19/2021 1539 Gross per 24 hour  Intake 100 ml  Output --  Net 100 ml   Filed Weights   01/19/21 1333  Weight: 48.5 kg    Examination:   General: deconditioned and ill looking appearing  Neurology: somnolent but easy to arouse, following simple commands and answering simple questions.  E ENT: positive pallor, no icterus, oral mucosa moist/ full face mask bipap in place.  Cardiovascular: No JVD. S1-S2 present, rhythmic, no gallops, rubs, or murmurs. No lower extremity edema. Pulmonary: positive breath sounds bilaterally, poor inspiratory wheezing, scattered bilateral rhonchi and rales. Gastrointestinal. Abdomen soft and non tender Skin. No rashes Musculoskeletal: no joint deformities     Data Reviewed: I have personally reviewed following labs and imaging  studies  CBC: Recent Labs  Lab 01/19/21 1355  WBC 29.2*  NEUTROABS 27.4*  HGB 11.3*  HCT 35.4*  MCV 93.7  PLT 222   Basic Metabolic Panel: Recent Labs  Lab 01/19/21 1355  NA 131*  K 4.4  CL 97*  CO2 27  GLUCOSE 102*  BUN 19  CREATININE 0.95  CALCIUM 8.9   GFR: Estimated Creatinine Clearance: 24.2 mL/min (by C-G formula based on SCr of 0.95 mg/dL). Liver Function Tests: No results for input(s): AST, ALT, ALKPHOS, BILITOT, PROT, ALBUMIN in the last 168 hours. No results for input(s): LIPASE, AMYLASE in the last 168 hours. No results for input(s): AMMONIA in the last 168 hours. Coagulation Profile: No results for input(s): INR, PROTIME in the last 168 hours. Cardiac Enzymes: No results for input(s): CKTOTAL, CKMB, CKMBINDEX, TROPONINI in the last 168 hours. BNP (last 3 results) No results for input(s): PROBNP in the last 8760 hours. HbA1C: No results for input(s): HGBA1C in the last 72 hours. CBG: No results for input(s): GLUCAP in the last 168 hours. Lipid Profile: No results for input(s): CHOL, HDL, LDLCALC, TRIG, CHOLHDL, LDLDIRECT in the last 72 hours. Thyroid Function Tests: No results for input(s): TSH, T4TOTAL, FREET4, T3FREE, THYROIDAB in the last 72 hours. Anemia Panel: No results for input(s): VITAMINB12, FOLATE, FERRITIN, TIBC, IRON, RETICCTPCT in the last 72 hours.    Radiology Studies: I have reviewed all of the imaging during this hospital visit personally  Scheduled Meds:  amiodarone  200 mg Oral Daily   apixaban  2.5 mg Oral BID   atorvastatin  20 mg Oral q1800   darifenacin  7.5 mg Oral Daily   doxycycline  100 mg Oral Q12H   fluticasone  2 spray Each Nare BID   [START ON 01/21/2021] furosemide  20 mg Oral QODAY   guaiFENesin  600 mg Oral BID   ipratropium-albuterol  3 mL Nebulization QID   isosorbide mononitrate  60 mg Oral Daily   levothyroxine  62.5 mcg Oral Q0600   pantoprazole  40 mg Oral Daily   polyethylene glycol  17 g  Oral Daily   saccharomyces boulardii  250 mg Oral BID   Continuous Infusions:  cefTRIAXone (ROCEPHIN)  IV       LOS: 1 day        Gabbie Marzo Gerome Apley, MD

## 2021-01-20 NOTE — ED Notes (Signed)
ED TO INPATIENT HANDOFF REPORT  ED Nurse Name and Phone #: Baxter Flattery, [employer]  S Name/Age/Gender Cathy [name] 87 y.o. female Room/Bed: 025C/025C  Code Status   Code Status: Full Code  Home/SNF/Other Home Patient oriented to: self, place, time, and situation Is this baseline? Yes   Triage Complete: Triage complete  Chief Complaint Acute respiratory failure with hypoxia ([place]) [J96.01]  Triage Note Pt BIB [facility] from [facility]. Pt dx with pneumonia on 11/15. Today pt was hypoxic at 88% at facility on 4L via N/C. EMS increased O2 to 6L and pt had some relief of dyspnea and SpO2 increased to 95%.   EMS V/S 98/50 HR 79 R18 Temp 97.2   Allergies Allergies  Allergen Reactions   Amoxicillin-Pot Clavulanate Diarrhea    Has patient had a PCN reaction causing immediate rash, facial/tongue/throat swelling, SOB or lightheadedness with hypotension: no Has patient had a PCN reaction causing severe rash involving mucus membranes or skin necrosis:  no Has patient had a PCN reaction that required hospitalization: no Has patient had a PCN reaction occurring within the last 10 years no If all of the above answers are "NO", then may proceed with Cephalosporin use.    Penicillins Diarrhea   Sulfonamide Derivatives Nausea Only    Level of Care/Admitting Diagnosis ED Disposition     ED Disposition  Admit   Condition  --   [place]: [place] [[id number]]  Level of Care: Progressive [102]  Admit to Progressive based on following criteria: RESPIRATORY PROBLEMS hypoxemic/hypercapnic respiratory failure that is responsive to NIPPV (BiPAP) or High Flow Nasal Cannula (6-80 lpm). Frequent assessment/intervention, no > Q2 hrs < Q4 hrs, to maintain oxygenation and pulmonary hygiene.  May admit patient to [facility] or [facility] if equivalent level of care is available:: No  Covid Evaluation: Confirmed COVID Negative  Diagnosis: Acute respiratory failure with  hypoxia [facility]) [[id number]]  Admitting Physician: Norval Morton [[id number]]  Attending Physician: Norval Morton [[id number]]  Estimated length of stay: past midnight tomorrow  Certification:: I certify this patient will need inpatient services for at least 2 midnights          B Medical/Surgery History Past Medical History:  Diagnosis Date   Allergic rhinitis    Anemia    Anxiety    Chronic systolic CHF (congestive heart failure) ([place])    a. 2D Echo 02/02/15: EF 25-30%, akinesis of mid-apical anteroseptal myocardium, grade 3 DD, mod MR, severely dilated LA, mildly reduced RV systoluc function, mod dilated RA, mod TR, mildly increased PASP 20mmHg. b. 10/2016: EF 55-60% with no regional WMA. Grade 3 DD.    CLOSTRIDIUM DIFFICILE COLITIS 05/26/2009   Qualifier: Diagnosis of  By: Nils Pyle CMA (AAMA), [place]     Colitis    lymphocytic colitis feb 2011   Coronary artery calcification    a. By CT 01/2015.   Diverticulosis of colon    DJD (degenerative joint disease)    gets epidural injections   Dysphagia    Hiatal hernia    Hypercholesteremia    Hypertension    Iron deficiency    Irritable bowel syndrome    Liver lesion 02/18/2015   Lumbar back pain    Osteoporosis    Paroxysmal atrial flutter ([place])    a. Dx 01/2015 - underwent DCCV 03/2015; takes Eliquis   Sinus bradycardia    Splenic lesion 02/18/2015   Squamous cell carcinoma in situ    Urinary incontinence  Venous insufficiency    Vitamin B12 deficiency    Past Surgical History:  Procedure Laterality Date   ABDOMINAL HYSTERECTOMY     CARDIOVERSION N/A 02/03/2015   Procedure: CARDIOVERSION;  Surgeon: Sueanne Margarita, MD;  Location: [place] [facility] ENDOSCOPY;  Service: Cardiovascular;  Laterality: N/A;   CARDIOVERSION N/A 03/10/2015   Procedure: CARDIOVERSION;  Surgeon: Dorothy Spark, MD;  Location: [facility];  Service: Cardiovascular;  Laterality: N/A;   CARDIOVERSION N/A 01/07/2017   Procedure: CARDIOVERSION;  Surgeon: Pixie Casino, MD;  Location: [facility] ENDOSCOPY;  Service: Cardiovascular;  Laterality: N/A;   cataract surgery     decompressive lumbar laminectomy  06/2008   at L2-3, L3-4 and L4-5 by Dr. Ronnald Ramp   HIP PINNING,CANNULATED Left 06/30/2019   Procedure: CANNULATED LEFT HIP PINNING;  Surgeon: Mcarthur Rossetti, MD;  Location: [facility] ORS;  Service: Orthopedics;  Laterality: Left;   LAPAROSCOPIC CHOLECYSTECTOMY  04/2009   Dr. Abran Cantor   REPLACEMENT TOTAL KNEE     TEE WITHOUT CARDIOVERSION N/A 02/03/2015   Procedure: TRANSESOPHAGEAL ECHOCARDIOGRAM (TEE);  Surgeon: Sueanne Margarita, MD;  Location: [facility] ENDOSCOPY;  Service: Cardiovascular;  Laterality: N/A;     A IV Location/Drains/Wounds Patient Lines/Drains/Airways Status     Active Line/Drains/Airways     Name Placement date Placement time Site Days   Peripheral IV 01/19/21 22 G 1" Anterior;Right Forearm 01/19/21  1350  Forearm  1   External Urinary Catheter 01/08/21  1215  --  12   Incision (Closed) 06/30/19 Hip Left 06/30/19  1006  -- 570            Intake/Output Last 24 hours  Intake/Output Summary (Last 24 hours) at 01/20/2021 1256 Last data filed at 01/19/2021 1539 Gross per 24 hour  Intake 100 ml  Output --  Net 100 ml    Labs/Imaging Results for orders placed or performed during the hospital encounter of 01/19/21 (from the past 48 hour(s))  Basic metabolic panel     Status: Abnormal   Collection Time: 01/19/21  1:55 PM  Result Value Ref [place]   Sodium 131 (L) 135 - 145 mmol/L   Potassium 4.4 3.5 - 5.1 mmol/L   Chloride 97 (L) 98 - 111 mmol/L   CO2 27 22 - 32 mmol/L   Glucose, Bld 102 (H) 70 - 99 mg/dL    Comment: Glucose reference range applies only to samples taken after fasting for at least 8 hours.   BUN 19 8 - 23 mg/dL   Creatinine, Ser 0.95 0.44 - 1.00 mg/dL   Calcium 8.9 8.9 - 10.3 mg/dL   GFR, Estimated 57 (L) >60 mL/min    Comment: (NOTE) Calculated using the CKD-EPI Creatinine Equation (2021)    Anion gap 7 5 - 15     Comment: Performed at [facility] [address], [place], [place] [address]  CBC with Differential     Status: Abnormal   Collection Time: 01/19/21  1:55 PM  Result Value Ref [place]   WBC 29.2 (H) 4.0 - 10.5 K/uL   RBC 3.78 (L) 3.87 - 5.11 MIL/uL   Hemoglobin 11.3 (L) 12.0 - 15.0 g/dL   HCT 35.4 (L) 36.0 - 46.0 %   MCV 93.7 80.0 - 100.0 fL   MCH 29.9 26.0 - 34.0 pg   MCHC 31.9 30.0 - 36.0 g/dL   RDW 15.0 11.5 - 15.5 %   Platelets 319 150 - 400 K/uL   nRBC 0.0 0.0 - 0.2 %  Neutrophils Relative % 94 %   Lymphocytes Relative 1 %   Monocytes Relative 5 %   Eosinophils Relative 0 %   Basophils Relative 0 %   Band Neutrophils 0 %   Metamyelocytes Relative 0 %   Myelocytes 0 %   Promyelocytes Relative 0 %   Blasts 0 %   nRBC 0 0 /100 WBC   Other 0 %   Neutro Abs 27.4 (H) 1.7 - 7.7 K/uL   Lymphs Abs 0.3 (L) 0.7 - 4.0 K/uL   Monocytes Absolute 1.5 (H) 0.1 - 1.0 K/uL   Eosinophils Absolute 0.0 0.0 - 0.5 K/uL   Basophils Absolute 0.0 0.0 - 0.1 K/uL   Abs Immature Granulocytes 0.00 0.00 - 0.07 K/uL   WBC Morphology WHITE COUNT CONFIRMED ON SMEAR    Smear Review PLATELET COUNT CONFIRMED BY SMEAR    Acanthocytes PRESENT     Comment: Performed at [facility], [place] [address], [place], [place] [address]  Brain natriuretic peptide     Status: Abnormal   Collection Time: 01/19/21  1:55 PM  Result Value Ref [place]   B Natriuretic Peptide 223.7 (H) 0.0 - 100.0 pg/mL    Comment: Performed at [facility] [address], [place], [place] [address]  Troponin I (High Sensitivity)     Status: Abnormal   Collection Time: 01/19/21  1:55 PM  Result Value Ref [place]   Troponin I (High Sensitivity) 22 (H) <18 ng/L    Comment: (NOTE) Elevated high sensitivity troponin I (hsTnI) values and significant  changes across serial measurements may suggest ACS but many other  chronic and acute conditions are known to elevate hsTnI results.  Refer to the "Links" section for chest pain  algorithms and additional  guidance. Performed at [facility], [place] [address], [place], [place] [address]   Lactic acid, plasma     Status: None   Collection Time: 01/19/21  1:55 PM  Result Value Ref [place]   Lactic Acid, Venous 1.3 0.5 - 1.9 mmol/L    Comment: Performed at [facility] [address], [place], [place] [address]  Culture, blood (routine x 2)     Status: None (Preliminary result)   Collection Time: 01/19/21  1:55 PM   Specimen: BLOOD RIGHT FOREARM  Result Value Ref [place]   Specimen Description BLOOD RIGHT FOREARM    Special Requests      BOTTLES DRAWN AEROBIC AND ANAEROBIC Blood Culture adequate volume   Culture      NO GROWTH < 24 HOURS Performed at [facility], [place] [address] [place], [place] [address]    Report Status PENDING   Resp Panel by RT-PCR (Flu A&B, Covid)     Status: None   Collection Time: 01/19/21  2:00 PM   Specimen: Nasopharyngeal(NP) swabs in vial transport medium  Result Value Ref [place]   SARS Coronavirus 2 by RT PCR NEGATIVE NEGATIVE    Comment: (NOTE) SARS-CoV-2 target nucleic acids are NOT DETECTED.  The SARS-CoV-2 RNA is generally detectable in upper respiratory specimens during the acute phase of infection. The lowest concentration of SARS-CoV-2 viral copies this assay can detect is 138 copies/mL. A negative result does not preclude SARS-Cov-2 infection and should not be used as the sole basis for treatment or other patient management decisions. A negative result may occur with  improper specimen collection/handling, submission of specimen other than nasopharyngeal swab, presence of viral mutation(s) within the areas targeted by  this assay, and inadequate number of viral copies(<138 copies/mL). A negative result must be combined with clinical observations, patient history, and epidemiological information. The expected result is Negative.  Fact Sheet for Patients:  EntrepreneurPulse.com.au  Fact  Sheet for Healthcare Providers:  IncredibleEmployment.be  This test is no t yet approved or cleared by the [place] FDA and  has been authorized for detection and/or diagnosis of SARS-CoV-2 by FDA under an Emergency Use Authorization (EUA). This EUA will remain  in effect (meaning this test can be used) for the duration of the COVID-19 declaration under Section 564(b)(1) of the Act, 21 U.S.C.section 360bbb-3(b)(1), unless the authorization is terminated  or revoked sooner.       Influenza A by PCR NEGATIVE NEGATIVE   Influenza B by PCR NEGATIVE NEGATIVE    Comment: (NOTE) The Xpert Xpress SARS-CoV-2/FLU/RSV plus assay is intended as an aid in the diagnosis of influenza from Nasopharyngeal swab specimens and should not be used as a sole basis for treatment. Nasal washings and aspirates are unacceptable for Xpert Xpress SARS-CoV-2/FLU/RSV testing.  Fact Sheet for Patients: EntrepreneurPulse.com.au  Fact Sheet for Healthcare Providers: IncredibleEmployment.be  This test is not yet approved or cleared by the [place] FDA and has been authorized for detection and/or diagnosis of SARS-CoV-2 by FDA under an Emergency Use Authorization (EUA). This EUA will remain in effect (meaning this test can be used) for the duration of the COVID-19 declaration under Section 564(b)(1) of the Act, 21 U.S.C. section 360bbb-3(b)(1), unless the authorization is terminated or revoked.  Performed at [facility], [place] [address], [place], [place] [address]   Culture, blood (routine x 2)     Status: None (Preliminary result)   Collection Time: 01/19/21  3:07 PM   Specimen: BLOOD  Result Value Ref [place]   Specimen Description BLOOD LEFT ANTECUBITAL    Special Requests      BOTTLES DRAWN AEROBIC AND ANAEROBIC Blood Culture adequate volume   Culture      NO GROWTH < 24 HOURS Performed at [facility], [place] [address]  [place], [place] [address]    Report Status PENDING   Brain natriuretic peptide     Status: Abnormal   Collection Time: 01/19/21  4:53 PM  Result Value Ref [place]   B Natriuretic Peptide 220.0 (H) 0.0 - 100.0 pg/mL    Comment: Performed at [facility] [address] [place], [place] [address]  Lactic acid, plasma     Status: None   Collection Time: 01/19/21  4:53 PM  Result Value Ref [place]   Lactic Acid, Venous 1.2 0.5 - 1.9 mmol/L    Comment: Performed at [facility] [address] [place], [place] [address]  Troponin I (High Sensitivity)     Status: Abnormal   Collection Time: 01/19/21  4:53 PM  Result Value Ref [place]   Troponin I (High Sensitivity) 18 (H) <18 ng/L    Comment: (NOTE) Elevated high sensitivity troponin I (hsTnI) values and significant  changes across serial measurements may suggest ACS but many other  chronic and acute conditions are known to elevate hsTnI results.  Refer to the "Links" section for chest pain algorithms and additional  guidance. Performed at [facility], [place] [address] [place], [place] [address]   Procalcitonin - Baseline     Status: None   Collection Time: 01/19/21  4:53 PM  Result Value Ref [place]   Procalcitonin <0.10 ng/mL  Comment:        Interpretation: PCT (Procalcitonin) <= 0.5 ng/mL: Systemic infection (sepsis) is not likely. Local bacterial infection is possible. (NOTE)       Sepsis PCT Algorithm           Lower Respiratory Tract                                      Infection PCT Algorithm    ----------------------------     ----------------------------         PCT < 0.25 ng/mL                PCT < 0.10 ng/mL          Strongly encourage             Strongly discourage   discontinuation of antibiotics    initiation of antibiotics    ----------------------------     -----------------------------       PCT 0.25 - 0.50 ng/mL            PCT 0.10 - 0.25 ng/mL               OR       >80% decrease in PCT             Discourage initiation of                                            antibiotics      Encourage discontinuation           of antibiotics    ----------------------------     -----------------------------         PCT >= 0.50 ng/mL              PCT 0.26 - 0.50 ng/mL               AND        <80% decrease in PCT             Encourage initiation of                                             antibiotics       Encourage continuation           of antibiotics    ----------------------------     -----------------------------        PCT >= 0.50 ng/mL                  PCT > 0.50 ng/mL               AND         increase in PCT                  Strongly encourage                                      initiation of antibiotics    Strongly encourage escalation           of antibiotics                                     -----------------------------  PCT <= 0.25 ng/mL                                                 OR                                        > 80% decrease in PCT                                      Discontinue / Do not initiate                                             antibiotics  Performed at [facility], [place] [address] [place], [place] [address]   Strep pneumoniae urinary antigen     Status: None   Collection Time: 01/20/21  8:25 AM  Result Value Ref [place]   Strep Pneumo Urinary Antigen NEGATIVE NEGATIVE    Comment:        Infection due to S. pneumoniae cannot be absolutely ruled out since the antigen present may be below the detection limit of the test. Performed at [facility], [address], [place], [place] [address]    DG Chest Port 1 View  Result Date: 01/19/2021 CLINICAL DATA:  Shortness of breath EXAM: PORTABLE CHEST 1 VIEW COMPARISON:  Chest x-ray 01/09/2021 FINDINGS: Heart is enlarged. Calcified plaques in the aortic arch. Large hiatal hernia again seen which contains bowel. Interval development of  diffuse increased interstitial and patchy airspace opacities throughout the right lung and more mildly in the left lung. Small right pleural effusion and possible small left pleural effusion. No pneumothorax. IMPRESSION: 1. Interval development of extensive pulmonary opacities bilaterally, right greater than left, likely representing pneumonia. 2. Small right pleural effusion and possible small left pleural effusion. 3. Cardiomegaly. 4. Large hiatal hernia. Electronically Signed   By: Ofilia Neas M.D.   On: 01/19/2021 14:30    Pending Labs Unresulted Labs (From admission, onward)    None       Vitals/Pain Today's Vitals   01/20/21 0215 01/20/21 0530 01/20/21 0800 01/20/21 1100  BP: 133/70 123/76 109/70 101/67  Pulse: 96 98 66 73  Resp: (!) 23 (!) 24 (!) 25 (!) 23  Temp:      TempSrc:      SpO2: 92% 91% 92% 99%  Weight:      Height:      PainSc:        Isolation Precautions No active isolations  Medications Medications  saccharomyces boulardii (FLORASTOR) capsule 250 mg (250 mg Oral Given 01/20/21 1028)  amiodarone (PACERONE) tablet 200 mg (200 mg Oral Given 01/20/21 1028)  levothyroxine (SYNTHROID) tablet 62.5 mcg (62.5 mcg Oral Given 01/20/21 0545)  polyethylene glycol (MIRALAX / GLYCOLAX) packet 17 g (17 g Oral Not Given 01/20/21 1030)  pantoprazole (PROTONIX) EC tablet 40 mg (40 mg Oral Given 01/20/21 1025)  apixaban (ELIQUIS) tablet 2.5 mg (2.5 mg Oral Given 01/20/21 1025)  guaiFENesin (MUCINEX) 12 hr tablet 600 mg (600 mg Oral Given 01/20/21 1028)  cefTRIAXone (ROCEPHIN) 2 g in sodium chloride 0.9 % 100 mL IVPB (has no administration in time range)  doxycycline (VIBRA-TABS) tablet 100 mg (100 mg Oral Given 01/20/21 1027)  ipratropium-albuterol (DUONEB) 0.5-2.5 (3) MG/3ML nebulizer solution 3 mL (3 mLs Nebulization Given 01/20/21 1158)  atorvastatin (LIPITOR) tablet 20 mg (has no administration in time range)  furosemide (LASIX) tablet 20 mg (has no administration in  time range)  isosorbide mononitrate (IMDUR) 24 hr tablet 60 mg (60 mg Oral Given 01/20/21 1025)  darifenacin (ENABLEX) 24 hr tablet 7.5 mg (7.5 mg Oral Given 01/20/21 1027)  fluticasone (FLONASE) 50 MCG/ACT nasal spray 2 spray (has no administration in time range)  cefTRIAXone (ROCEPHIN) 1 g in sodium chloride 0.9 % 100 mL IVPB (0 g Intravenous Stopped 01/19/21 1539)  doxycycline (VIBRA-TABS) tablet 100 mg (100 mg Oral Given 01/19/21 1508)  furosemide (LASIX) injection 20 mg (20 mg Intravenous Given 01/19/21 2042)    Mobility walks with device Moderate fall risk   Focused Assessments Pulmonary Assessment Handoff:  Lung sounds: Bilateral Breath Sounds: Diminished L Breath Sounds: Rhonchi R Breath Sounds: Rhonchi O2 Device: Bi-PAP     R Recommendations: See Admitting Provider Note  Report given to:   Additional Notes:

## 2021-01-20 NOTE — Plan of Care (Signed)
  Problem: Education: Goal: Knowledge of General Education information will improve Description: Including pain rating scale, medication(s)/side effects and non-pharmacologic comfort measures Outcome: Progressing   Problem: Health Behavior/Discharge Planning: Goal: Ability to manage health-related needs will improve Outcome: Progressing   Problem: Clinical Measurements: Goal: Ability to maintain clinical measurements within normal limits will improve Outcome: Progressing Goal: Respiratory complications will improve Outcome: Progressing   Problem: Activity: Goal: Risk for activity intolerance will decrease Outcome: Progressing   Problem: Pain Managment: Goal: General experience of comfort will improve Outcome: Progressing   Problem: Skin Integrity: Goal: Risk for impaired skin integrity will decrease Outcome: Progressing   

## 2021-01-20 NOTE — Progress Notes (Signed)
Pt transported on BiPAP from ED 25 to 2C15 without any complications.

## 2021-01-20 NOTE — ED Notes (Signed)
Speech therapy came to see pt at this time to do swallow eval. Pt is still on BiPap at this time. Speech therapist states that they will follow up tomorrow.

## 2021-01-20 NOTE — Progress Notes (Signed)
Mepilex placed on the bridge of pts nose to prevent breakdown from BIPAP mask.

## 2021-01-20 NOTE — ED Notes (Signed)
Went into pt room to reassess her, pt sats in the upper 70s low 80s, pt labored breathing, stating she can't breath through her nose. Pt O2 increased to 15L [place] humidified. RT at bedside and order for Bipap placed by provider. Provider aware.

## 2021-01-20 NOTE — Progress Notes (Signed)
Patient admitted to the floor this afternoon from ED. Upon assessment on left lower leg, a wound is found. Patient's family reports this happened at an outpatient clinic when patient hit a wheelchair's sharp piece and it cut into her leg.   The cut seems to be healing and is about 2.5 cm in length, linear in shape. Cleansed and new foam applied, old dressing removed.   The area immediately surrounding this wound is a deeper pink with some mild tissue swelling. This pink discoloration extends out about 5 cm in diameter around the wound.

## 2021-01-20 NOTE — Progress Notes (Signed)
Pharmacy Antibiotic Note  JULIZZA SASSONE is a 87 y.o. female admitted on 01/19/2021 with  aspiration PNA .  Pharmacy has been consulted for unasyn dosing.  Plan: Unasyn 1.5mg  IV q12h -Monitor renal function, clinical status, and antibiotic plan  Height: 4\' 7"  (139.7 cm) Weight: 48.5 kg (107 lb) IBW/kg (Calculated) : 34  Temp (24hrs), Avg:98.5 F (36.9 C), Min:98.5 F (36.9 C), Max:98.5 F (36.9 C)  Recent Labs  Lab 01/19/21 1355 01/19/21 1653  WBC 29.2*  --   CREATININE 0.95  --   LATICACIDVEN 1.3 1.2    Estimated Creatinine Clearance: 24.2 mL/min (by C-G formula based on SCr of 0.95 mg/dL).    Allergies  Allergen Reactions   Amoxicillin-Pot Clavulanate Diarrhea    Has patient had a PCN reaction causing immediate rash, facial/tongue/throat swelling, SOB or lightheadedness with hypotension: no Has patient had a PCN reaction causing severe rash involving mucus membranes or skin necrosis:  no Has patient had a PCN reaction that required hospitalization: no Has patient had a PCN reaction occurring within the last 10 years no If all of the above answers are "NO", then may proceed with Cephalosporin use.    Penicillins Diarrhea   Sulfonamide Derivatives Nausea Only    Antimicrobials this admission: Unasyn 11/22 >>  Ceftriaxone x1 on 11/22 in ED   Thank you for allowing pharmacy to be a part of this patient's care.  Joetta Manners, PharmD, [facility] Emergency Medicine Clinical Pharmacist ED RPh Phone: [place]: [phone]

## 2021-01-20 NOTE — ED Notes (Signed)
Bipap mask readjusted d/t leak and discomfort. Pt requesting mask be removed. Pt educated that she has to remain on Bipap until provider and RT assess and determine that Bipap can be discontinued per pt condition/improvement.

## 2021-01-20 NOTE — Progress Notes (Signed)
Patient's family has been having discussion lately regarding patient needing more help in the home

## 2021-01-21 DIAGNOSIS — J189 Pneumonia, unspecified organism: Secondary | ICD-10-CM

## 2021-01-21 DIAGNOSIS — E039 Hypothyroidism, unspecified: Secondary | ICD-10-CM | POA: Diagnosis not present

## 2021-01-21 DIAGNOSIS — J9621 Acute and chronic respiratory failure with hypoxia: Secondary | ICD-10-CM | POA: Diagnosis not present

## 2021-01-21 DIAGNOSIS — I4891 Unspecified atrial fibrillation: Secondary | ICD-10-CM | POA: Diagnosis not present

## 2021-01-21 LAB — BASIC METABOLIC PANEL
Anion gap: 6 (ref 5–15)
BUN: 16 mg/dL (ref 8–23)
CO2: 22 mmol/L (ref 22–32)
Calcium: 7.7 mg/dL — ABNORMAL LOW (ref 8.9–10.3)
Chloride: 107 mmol/L (ref 98–111)
Creatinine, Ser: 0.89 mg/dL (ref 0.44–1.00)
GFR, Estimated: >60 mL/min (ref >60)
Glucose, Bld: 394 mg/dL — ABNORMAL HIGH (ref 70–99)
Potassium: 3.9 mmol/L (ref 3.5–5.1)
Sodium: 135 mmol/L (ref 135–145)

## 2021-01-21 LAB — CBC
HCT: 28.4 % — ABNORMAL LOW (ref 36.0–46.0)
Hemoglobin: 8.8 g/dL — ABNORMAL LOW (ref 12.0–15.0)
MCH: 29.1 pg (ref 26.0–34.0)
MCHC: 31 g/dL (ref 30.0–36.0)
MCV: 94 fL (ref 80.0–100.0)
Platelets: 232 10*3/uL (ref 150–400)
RBC: 3.02 MIL/uL — ABNORMAL LOW (ref 3.87–5.11)
RDW: 15.2 % (ref 11.5–15.5)
WBC: 20.7 10*3/uL — ABNORMAL HIGH (ref 4.0–10.5)
nRBC: 0 % (ref 0.0–0.2)

## 2021-01-21 MED ORDER — GUAIFENESIN-DM 100-10 MG/5ML PO SYRP
5.0000 mL | ORAL_SOLUTION | ORAL | Status: DC | PRN
  Administered 2021-01-21 – 2021-02-01 (×17): 5 mL via ORAL
  Filled 2021-01-21 (×17): qty 5

## 2021-01-21 MED ORDER — IPRATROPIUM-ALBUTEROL 0.5-2.5 (3) MG/3ML IN SOLN
3.0000 mL | Freq: Three times a day (TID) | RESPIRATORY_TRACT | Status: DC
  Administered 2021-01-21 – 2021-01-23 (×8): 3 mL via RESPIRATORY_TRACT
  Filled 2021-01-21 (×8): qty 3

## 2021-01-21 MED ORDER — ACETAMINOPHEN 325 MG PO TABS
650.0000 mg | ORAL_TABLET | Freq: Four times a day (QID) | ORAL | Status: DC | PRN
  Administered 2021-01-21: 650 mg via ORAL
  Filled 2021-01-21: qty 2

## 2021-01-21 NOTE — Plan of Care (Signed)
  Problem: Clinical Measurements: Goal: Respiratory complications will improve Outcome: Progressing  Pt was irritable at times, slept for the night , didn't want bipap replaced ,o2 via  Bend placed.

## 2021-01-21 NOTE — Progress Notes (Signed)
Patient asked to please not put the chest vest on her.  I explained if she changed her mind to have the RN call me.

## 2021-01-21 NOTE — Progress Notes (Signed)
Pt is sleeping while wearing bipap. No CPT at this time.

## 2021-01-21 NOTE — Evaluation (Signed)
Clinical/Bedside Swallow Evaluation Patient Details  Name: Cathy [name] MRN: [id number] Date of Birth: 1927-09-26  Today's Date: 01/21/2021 Time: SLP Start Time (ACUTE ONLY): 53 SLP Stop Time (ACUTE ONLY): 1508 SLP Time Calculation (min) (ACUTE ONLY): 22 min  Past Medical History:  Past Medical History:  Diagnosis Date   Allergic rhinitis    Anemia    Anxiety    Chronic systolic CHF (congestive heart failure) ([place])    a. 2D Echo 02/02/15: EF 25-30%, akinesis of mid-apical anteroseptal myocardium, grade 3 DD, mod MR, severely dilated LA, mildly reduced RV systoluc function, mod dilated RA, mod TR, mildly increased PASP 37mmHg. b. 10/2016: EF 55-60% with no regional WMA. Grade 3 DD.    CLOSTRIDIUM DIFFICILE COLITIS 05/26/2009   Qualifier: Diagnosis of  By: Nils Pyle CMA (AAMA), Leisha     Colitis    lymphocytic colitis feb 2011   Coronary artery calcification    a. By CT 01/2015.   Diverticulosis of colon    DJD (degenerative joint disease)    gets epidural injections   Dysphagia    Hiatal hernia    Hypercholesteremia    Hypertension    Iron deficiency    Irritable bowel syndrome    Liver lesion 02/18/2015   Lumbar back pain    Osteoporosis    Paroxysmal atrial flutter ([place])    a. Dx 01/2015 - underwent DCCV 03/2015; takes Eliquis   Sinus bradycardia    Splenic lesion 02/18/2015   Squamous cell carcinoma in situ    Urinary incontinence    Venous insufficiency    Vitamin B12 deficiency    Past Surgical History:  Past Surgical History:  Procedure Laterality Date   ABDOMINAL HYSTERECTOMY     CARDIOVERSION N/A 02/03/2015   Procedure: CARDIOVERSION;  Surgeon: Sueanne Margarita, MD;  Location: [facility];  Service: Cardiovascular;  Laterality: N/A;   CARDIOVERSION N/A 03/10/2015   Procedure: CARDIOVERSION;  Surgeon: Dorothy Spark, MD;  Location: [facility];  Service: Cardiovascular;  Laterality: N/A;   CARDIOVERSION N/A 01/07/2017   Procedure: CARDIOVERSION;  Surgeon:  Pixie Casino, MD;  Location: [facility] ENDOSCOPY;  Service: Cardiovascular;  Laterality: N/A;   cataract surgery     decompressive lumbar laminectomy  06/2008   at L2-3, L3-4 and L4-5 by Dr. Ronnald Ramp   HIP PINNING,CANNULATED Left 06/30/2019   Procedure: CANNULATED LEFT HIP PINNING;  Surgeon: Mcarthur Rossetti, MD;  Location: [facility];  Service: Orthopedics;  Laterality: Left;   LAPAROSCOPIC CHOLECYSTECTOMY  04/2009   Dr. Abran Cantor   REPLACEMENT TOTAL KNEE     TEE WITHOUT CARDIOVERSION N/A 02/03/2015   Procedure: TRANSESOPHAGEAL ECHOCARDIOGRAM (TEE);  Surgeon: Sueanne Margarita, MD;  Location: [facility] ENDOSCOPY;  Service: Cardiovascular;  Laterality: N/A;   HPI:  87 year old female with past medical history for hypertension, dyslipidemia, heart failure, paroxysmal atrial flutter, hiatal hernia and osteoarthritis who presented with cough and dyspnea.  Recent hospitalization 11/8-11/14 for acute hypoxic respiratory failure due to pneumonia and rhinovirus infection.  Dx acute hypoxemic respiratory failure due to multilobar pneumonia complicated with sepsis.    Assessment / Plan / Recommendation  Clinical Impression  Pt presented with no clinical s/sx of dysphagia or aspiration.  There was thorough mastication; consistent and palpable swallow response; no s/sx of aspiration.  RR was within parameters that would allow adequate respiratory/swallowing reciprocity. Recommend advancing diet to regular solids, thin liquids to allow choices; meds whole in water.  When pt's oxygen needs are higher and/or she is tachypneic, she may  develop acute dysphagia - in those instances, consider holding tray until she stabilizes.  SLP will follow briefly to ensure education/safety. SLP Visit Diagnosis: Dysphagia, unspecified (R13.10)    Aspiration Risk  No limitations    Diet Recommendation   Regular solids, thin liquids  Medication Administration: Whole meds with liquid    Other  Recommendations Oral Care Recommendations: Oral  care BID    Recommendations for follow up therapy are one component of a multi-disciplinary discharge planning process, led by the attending physician.  Recommendations may be updated based on patient status, additional functional criteria and insurance authorization.  Follow up Recommendations No SLP follow up      Assistance Recommended at Discharge    Functional Status Assessment    Frequency and Duration            Prognosis        Swallow Study   General Date of Onset: 01/19/21 HPI: 87 year old female with past medical history for hypertension, dyslipidemia, heart failure, paroxysmal atrial flutter, hiatal hernia and osteoarthritis who presented with cough and dyspnea.  Recent hospitalization 11/8-11/14 for acute hypoxic respiratory failure due to pneumonia and rhinovirus infection.  Dx acute hypoxemic respiratory failure due to multilobar pneumonia complicated with sepsis. Type of Study: Bedside Swallow Evaluation Diet Prior to this Study: Dysphagia 1 (puree);Thin liquids Temperature Spikes Noted: No Respiratory Status: Nasal cannula (10L HFNC) History of Recent Intubation: No Behavior/Cognition: Alert;Cooperative Oral Cavity Assessment: Within Functional Limits Oral Care Completed by SLP: No Oral Cavity - Dentition: Adequate natural dentition Vision: Functional for self-feeding Self-Feeding Abilities: Able to feed self Patient Positioning: Upright in bed Baseline Vocal Quality: Normal Volitional Cough: Strong Volitional Swallow: Able to elicit    Oral/Motor/Sensory Function Overall Oral Motor/Sensory Function: Within functional limits   Ice Chips Ice chips: Within functional limits   Thin Liquid Thin Liquid: Within functional limits    Nectar Thick Nectar Thick Liquid: Not tested   Honey Thick Honey Thick Liquid: Not tested   Puree Puree: Within functional limits   Solid     Solid: Within functional limits     Alix Lahmann L. Tivis Ringer, [place] Office number [phone] Pager [phone]  Cathy [name] 01/21/2021,3:09 PM

## 2021-01-21 NOTE — Progress Notes (Signed)
[facility]  PROGRESS NOTE  Cathy [name] YFV:[id number] DOB: 01-Jul-1927 DOA: 01/19/2021 PCP: Biagio Borg, MD   Brief HPI:   87 year old female with medical history of hypertension, dyslipidemia, heart failure, paroxysmal atrial flutter, hiatal hernia, osteoarthritis presented with cough and dyspnea.  She had recent hospitalization from 01/06/2021 to 01/12/2021 for acute hypoxemic respiratory failure due to pneumonia and rhinovirus infection.  She received antibiotic therapy and was discharged to skilled nursing facility on supplemental oxygen 3 L/min. At facility she continued to have cough and was requiring 4 L of oxygen, O2 sats was 88%.  She was brought to the ED.  Chest x-ray showed new interstitial/alveolar infiltrate right upper lobe and left lower lobe.  Patient started on IV antibiotic therapy.    Subjective   Patient seen and examined, still requiring 10 L/min  HFNC   Assessment/Plan:    Acute hypoxemic respiratory failure -Secondary to multifocal pneumonia, right upper lobe and left lower lobe -Swallow evaluation shows no aspiration risk -Patient is currently on IV Unasyn -Continue bronchodilator therapy  Chronic diastolic heart failure -Appears euvolemic -BNP 220 -Lasix currently on hold  Paroxysmal atrial flutter -Rate controlled; continue amiodarone -Continue apixaban for anticoagulation  Hypertension -Continue isosorbide -Blood pressure is stable  Hypothyroidism -Continue Synthroid  Dyslipidemia -Continue atorvastatin   Hypovolemic hyponatremia -Resolved    Medications     amiodarone  200 mg Oral Daily   apixaban  2.5 mg Oral BID   atorvastatin  20 mg Oral q1800   darifenacin  7.5 mg Oral Daily   fluticasone  2 spray Each Nare BID   guaiFENesin  600 mg Oral BID   ipratropium-albuterol  3 mL Nebulization TID   isosorbide mononitrate  60 mg Oral Daily   levothyroxine  62.5 mcg Oral Q0600   pantoprazole  40 mg Oral Daily   polyethylene  glycol  17 g Oral Daily   saccharomyces boulardii  250 mg Oral BID     Data Reviewed:   CBG:  Recent Labs  Lab 01/20/21 2139  GLUCAP 128*    SpO2: 93 % O2 Flow Rate (L/min): 10 L/min FiO2 (%): 50 %    Vitals:   01/21/21 0734 01/21/21 0800 01/21/21 1104 01/21/21 1449  BP:  98/65    Pulse: 62 64    Resp: (!) 22 20    Temp:  98 F (36.7 C) 98.1 F (36.7 C)   TempSrc:  Oral Oral   SpO2: 93% 94% 96% 93%  Weight:      Height:         Intake/Output Summary (Last 24 hours) at 01/21/2021 1720 Last data filed at 01/21/2021 1644 Gross per 24 hour  Intake 180 ml  Output --  Net 180 ml    No intake/output data recorded.  Filed Weights   01/19/21 1333  Weight: 48.5 kg    Data Reviewed: Basic Metabolic Panel: Recent Labs  Lab 01/19/21 1355 01/21/21 0201  NA 131* 135  K 4.4 3.9  CL 97* 107  CO2 27 22  GLUCOSE 102* 394*  BUN 19 16  CREATININE 0.95 0.89  CALCIUM 8.9 7.7*   Liver Function Tests: No results for input(s): AST, ALT, ALKPHOS, BILITOT, PROT, ALBUMIN in the last 168 hours. No results for input(s): LIPASE, AMYLASE in the last 168 hours. No results for input(s): AMMONIA in the last 168 hours. CBC: Recent Labs  Lab 01/19/21 1355 01/21/21 0201  WBC 29.2* 20.7*  NEUTROABS 27.4*  --   HGB 11.3*  8.8*  HCT 35.4* 28.4*  MCV 93.7 94.0  PLT 319 232   Cardiac Enzymes: No results for input(s): CKTOTAL, CKMB, CKMBINDEX, TROPONINI in the last 168 hours. BNP (last 3 results) Recent Labs    01/06/21 1025 01/19/21 1355 01/19/21 1653  BNP 591.6* 223.7* 220.0*    ProBNP (last 3 results) No results for input(s): PROBNP in the last 8760 hours.  CBG: Recent Labs  Lab 01/20/21 2139  GLUCAP 128*       Radiology Reports  No results found.     Antibiotics: Anti-infectives (From admission, onward)    Start     Dose/Rate Route Frequency Ordered Stop   01/20/21 1800  ampicillin-sulbactam (UNASYN) 1.5 g in sodium chloride 0.9 % 100 mL IVPB         1.5 g 200 mL/hr over 30 Minutes Intravenous Every 12 hours 01/20/21 1417     01/20/21 1500  cefTRIAXone (ROCEPHIN) 2 g in sodium chloride 0.9 % 100 mL IVPB  Status:  Discontinued        2 g 200 mL/hr over 30 Minutes Intravenous Every 24 hours 01/19/21 1621 01/20/21 1414   01/19/21 2200  doxycycline (VIBRA-TABS) tablet 100 mg  Status:  Discontinued        100 mg Oral Every 12 hours 01/19/21 1621 01/20/21 1414   01/19/21 1445  cefTRIAXone (ROCEPHIN) 1 g in sodium chloride 0.9 % 100 mL IVPB        1 g 200 mL/hr over 30 Minutes Intravenous  Once 01/19/21 1443 01/19/21 1539   01/19/21 1445  doxycycline (VIBRA-TABS) tablet 100 mg        100 mg Oral  Once 01/19/21 1443 01/19/21 1508         DVT prophylaxis: Apixaban  Code Status: DNR  Family Communication: Discussed with grandson at bedside   Consultants: None  Procedures:     Objective    Physical Examination:   General-appears in no acute distress Heart-S1-S2, regular, no murmur auscultated Lungs-bilateral rhonchi auscultated Abdomen-soft, nontender, no organomegaly Extremities-no edema in the lower extremities Neuro-alert, oriented x3, no focal deficit noted  Status is: Inpatient  Dispo: The patient is from: Skilled nursing facility              Anticipated d/c is to: Skilled nursing facility              Anticipated d/c date is: 01/26/2021              Patient currently not stable for discharge  Barrier to discharge-ongoing treatment for acute hypoxemic respiratory failure  COVID-19 Labs  No results for input(s): DDIMER, FERRITIN, LDH, CRP in the last 72 hours.  Lab Results  Component Value Date   SARSCOV2NAA NEGATIVE 01/19/2021   SARSCOV2NAA NEGATIVE 01/12/2021   [place] NEGATIVE 01/09/2021   [place] NEGATIVE 01/06/2021            Recent Results (from the past 240 hour(s))  Resp Panel by RT-PCR (Flu A&B, Covid) Nasopharyngeal Swab     Status: None   Collection Time: 01/12/21 12:53  PM   Specimen: Nasopharyngeal Swab; Nasopharyngeal(NP) swabs in vial transport medium  Result Value Ref Range Status   SARS Coronavirus 2 by RT PCR NEGATIVE NEGATIVE Final    Comment: (NOTE) SARS-CoV-2 target nucleic acids are NOT DETECTED.  The SARS-CoV-2 RNA is generally detectable in upper respiratory specimens during the acute phase of infection. The lowest concentration of SARS-CoV-2 viral copies this assay can detect is 138 copies/mL. A negative  result does not preclude SARS-Cov-2 infection and should not be used as the sole basis for treatment or other patient management decisions. A negative result may occur with  improper specimen collection/handling, submission of specimen other than nasopharyngeal swab, presence of viral mutation(s) within the areas targeted by this assay, and inadequate number of viral copies(<138 copies/mL). A negative result must be combined with clinical observations, patient history, and epidemiological information. The expected result is Negative.  Fact Sheet for Patients:  EntrepreneurPulse.com.au  Fact Sheet for Healthcare Providers:  IncredibleEmployment.be  This test is no t yet approved or cleared by the [place] FDA and  has been authorized for detection and/or diagnosis of SARS-CoV-2 by FDA under an Emergency Use Authorization (EUA). This EUA will remain  in effect (meaning this test can be used) for the duration of the COVID-19 declaration under Section 564(b)(1) of the Act, 21 U.S.C.section 360bbb-3(b)(1), unless the authorization is terminated  or revoked sooner.       Influenza A by PCR NEGATIVE NEGATIVE Final   Influenza B by PCR NEGATIVE NEGATIVE Final    Comment: (NOTE) The Xpert Xpress SARS-CoV-2/FLU/RSV plus assay is intended as an aid in the diagnosis of influenza from Nasopharyngeal swab specimens and should not be used as a sole basis for treatment. Nasal washings and aspirates are  unacceptable for Xpert Xpress SARS-CoV-2/FLU/RSV testing.  Fact Sheet for Patients: EntrepreneurPulse.com.au  Fact Sheet for Healthcare Providers: IncredibleEmployment.be  This test is not yet approved or cleared by the [place] FDA and has been authorized for detection and/or diagnosis of SARS-CoV-2 by FDA under an Emergency Use Authorization (EUA). This EUA will remain in effect (meaning this test can be used) for the duration of the COVID-19 declaration under Section 564(b)(1) of the Act, 21 U.S.C. section 360bbb-3(b)(1), unless the authorization is terminated or revoked.  Performed at [facility], [place] [address] [place], [place] [address]   Culture, blood (routine x 2)     Status: None (Preliminary result)   Collection Time: 01/19/21  1:55 PM   Specimen: BLOOD RIGHT FOREARM  Result Value Ref Range Status   Specimen Description BLOOD RIGHT FOREARM  Final   Special Requests   Final    BOTTLES DRAWN AEROBIC AND ANAEROBIC Blood Culture adequate volume   Culture   Final    NO GROWTH 2 DAYS Performed at [facility], [place] [address], [place], [place] [address]    Report Status PENDING  Incomplete  Resp Panel by RT-PCR (Flu A&B, Covid)     Status: None   Collection Time: 01/19/21  2:00 PM   Specimen: Nasopharyngeal(NP) swabs in vial transport medium  Result Value Ref Range Status   SARS Coronavirus 2 by RT PCR NEGATIVE NEGATIVE Final    Comment: (NOTE) SARS-CoV-2 target nucleic acids are NOT DETECTED.  The SARS-CoV-2 RNA is generally detectable in upper respiratory specimens during the acute phase of infection. The lowest concentration of SARS-CoV-2 viral copies this assay can detect is 138 copies/mL. A negative result does not preclude SARS-Cov-2 infection and should not be used as the sole basis for treatment or other patient management decisions. A negative result may occur with  improper specimen collection/handling,  submission of specimen other than nasopharyngeal swab, presence of viral mutation(s) within the areas targeted by this assay, and inadequate number of viral copies(<138 copies/mL). A negative result must be combined with clinical observations, patient history, and epidemiological information. The expected result is Negative.  Fact Sheet for Patients:  EntrepreneurPulse.com.au  Fact Sheet for Healthcare Providers:  IncredibleEmployment.be  This test is no t yet approved or cleared by the [place] FDA and  has been authorized for detection and/or diagnosis of SARS-CoV-2 by FDA under an Emergency Use Authorization (EUA). This EUA will remain  in effect (meaning this test can be used) for the duration of the COVID-19 declaration under Section 564(b)(1) of the Act, 21 U.S.C.section 360bbb-3(b)(1), unless the authorization is terminated  or revoked sooner.       Influenza A by PCR NEGATIVE NEGATIVE Final   Influenza B by PCR NEGATIVE NEGATIVE Final    Comment: (NOTE) The Xpert Xpress SARS-CoV-2/FLU/RSV plus assay is intended as an aid in the diagnosis of influenza from Nasopharyngeal swab specimens and should not be used as a sole basis for treatment. Nasal washings and aspirates are unacceptable for Xpert Xpress SARS-CoV-2/FLU/RSV testing.  Fact Sheet for Patients: EntrepreneurPulse.com.au  Fact Sheet for Healthcare Providers: IncredibleEmployment.be  This test is not yet approved or cleared by the [place] FDA and has been authorized for detection and/or diagnosis of SARS-CoV-2 by FDA under an Emergency Use Authorization (EUA). This EUA will remain in effect (meaning this test can be used) for the duration of the COVID-19 declaration under Section 564(b)(1) of the Act, 21 U.S.C. section 360bbb-3(b)(1), unless the authorization is terminated or revoked.  Performed at [facility], [place] [address] [place], [place] [address]   Culture, blood (routine x 2)     Status: None (Preliminary result)   Collection Time: 01/19/21  3:07 PM   Specimen: BLOOD  Result Value Ref Range Status   Specimen Description BLOOD LEFT ANTECUBITAL  Final   Special Requests   Final    BOTTLES DRAWN AEROBIC AND ANAEROBIC Blood Culture adequate volume   Culture   Final    NO GROWTH 2 DAYS Performed at [facility], [place] [address], [place], [place] [address]    Report Status PENDING  Incomplete  MRSA Next Gen by PCR, Nasal     Status: None   Collection Time: 01/20/21  3:51 PM   Specimen: Nasal Mucosa; Nasal Swab  Result Value Ref Range Status   MRSA by PCR Next Gen NOT DETECTED NOT DETECTED Final    Comment: (NOTE) The GeneXpert MRSA Assay (FDA approved for NASAL specimens only), is one component of a comprehensive MRSA colonization surveillance program. It is not intended to diagnose MRSA infection nor to guide or monitor treatment for MRSA infections. Test performance is not FDA approved in patients less than 50 years old. Performed at [facility], [place] [address], [place], [place] [address]     [facility] If 7PM-7AM, please contact night-coverage at [email or web address], Office  [phone]   01/21/2021, 5:20 PM  LOS: 2 days

## 2021-01-21 NOTE — Care Management Important Message (Signed)
Important Message  Patient Details  Name: Cathy [name] MRN: [id number] Date of Birth: Mar 06, 1927   Medicare Important Message Given:  Yes     Orbie Pyo 01/21/2021, 3:39 PM

## 2021-01-21 NOTE — Progress Notes (Signed)
Mobility Specialist Progress Note    01/21/21 1648  Mobility  Activity Refused mobility   Pt stated she was in too much pain and too tired. Will f/u as schedule permits.   [facility] Mobility Specialist  M.S. Primary Phone: [phone] M.S. Secondary Phone: [phone]

## 2021-01-21 NOTE — NC FL2 (Signed)
[place] LEVEL OF CARE SCREENING TOOL     IDENTIFICATION  Patient Name: Cathy [name] Birthdate: 09-06-1927 Sex: female Admission Date (Current Location): 01/19/2021  [facility] and [place] Number:  Herbalist and Address:  [facility], [place] [address], [place], [place] [address]      Provider Number: [id number]  Attending Physician Name and Address:  Oswald Hillock, MD  Relative Name and Phone Number:  Sonyia, Muro     [phone]  Daughter-in-law    Current Level of Care: Hospital Recommended Level of Care: [place] Prior Approval Number:    Date Approved/Denied:   PASRR Number: [id number]  Discharge Plan: SNF    Current Diagnoses: Patient Active Problem List   Diagnosis Date Noted   Aspiration pneumonia ([place]) 01/19/2021   Malnutrition of moderate degree 01/09/2021   Acute on chronic respiratory failure with hypoxemia ([place]) 01/07/2021   Severe sepsis ([place]) 01/07/2021   Acute respiratory distress 01/06/2021   Community acquired pneumonia 01/06/2021   Heart failure with preserved ejection fraction ([place]) 01/06/2021   Hyponatremia 01/06/2021   SIRS (systemic inflammatory response syndrome) ([place]) 01/06/2021   Primary osteoarthritis of both hands 07/06/2019   Hip fracture ([place]) 06/29/2019   Closed fracture of neck of left femur ([place]) 06/28/2019   GERD without esophagitis 06/28/2019   Noninfected skin tear of leg, right, initial encounter 04/19/2018   Bowel obstruction ([place]) 02/21/2018   Hypothyroidism 02/21/2018   Osteoarthritis of left shoulder 08/10/2017   Pain in joint of left shoulder 08/10/2017   Mass of joint of left wrist 06/22/2017   Arthritis of left wrist 06/21/2017   Carpal tunnel syndrome 06/21/2017   Tenosynovitis of left wrist 06/21/2017   Fracture of distal end of humerus 03/25/2017   Closed supracondylar fracture of left humerus 01/15/2017   Gait disorder 01/15/2017   Recurrent falls  01/15/2017   Rib fractures 11/28/2016   Skin lesion 09/30/2016   Ganglion cyst of dorsum of left wrist 09/30/2016   Rotator cuff arthropathy, right 04/26/2016   Hyperglycemia 04/01/2016   Right arm pain 04/01/2016   Wrist swelling 09/30/2015   Preventative health care 04/02/2015   Chronic combined systolic and diastolic CHF (congestive heart failure) ([place]) 03/17/2015   Coronary artery calcification seen on CT scan 03/17/2015   Sinus bradycardia 03/17/2015   Splenic lesion 02/18/2015   Liver lesion 02/18/2015   Coronary artery disease involving native coronary artery of native heart without angina pectoris 02/03/2015   Atrial fibrillation and flutter ([place]) 02/02/2015   Fatigue 01/31/2015   Abnormal CT of the abdomen 01/31/2015   Prolonged Q-T interval on ECG 01/31/2015   Hepatic lesion    Chronic constipation 12/03/2014   Lower extremity edema 04/25/2014   Peripheral neuropathy ([place]) 12/10/2013   Overactive bladder 12/27/2011   Senile osteoporosis 12/27/2011   Vitamin B12 deficiency 05/03/2010   Iron deficiency anemia 05/03/2010   Normocytic anemia 05/03/2010   VENOUS INSUFFICIENCY 01/27/2009   BACK PAIN, LUMBAR 01/27/2008   URINARY INCONTINENCE 08/23/2007   DIVERTICULOSIS OF COLON 04/10/2007   HYPERCHOLESTEROLEMIA 02/13/2007   ANXIETY 02/13/2007   Essential hypertension 02/13/2007   Diaphragmatic hernia 02/13/2007   Irritable bowel syndrome 02/13/2007   DEGENERATIVE JOINT DISEASE 02/13/2007   Allergic rhinitis 02/13/2007    Orientation RESPIRATION BLADDER Height & Weight     Self, Situation, Place, Time  O2 Incontinent, External catheter Weight: 107 lb (48.5 kg) Height:  4\' 7"  (139.7 cm)  BEHAVIORAL SYMPTOMS/MOOD NEUROLOGICAL  BOWEL NUTRITION STATUS      Continent Diet (See DC Summary)  AMBULATORY STATUS COMMUNICATION OF NEEDS Skin   Limited Assist Verbally Normal                       Personal Care Assistance Level of Assistance  Bathing, Dressing, Feeding            Functional Limitations Info  Sight, Hearing, Speech Sight Info: Impaired Hearing Info: Impaired Speech Info: Adequate    SPECIAL CARE FACTORS FREQUENCY  PT (By licensed PT), OT (By licensed OT)                    Contractures Contractures Info: Not present    Additional Factors Info  Code Status, Allergies Code Status Info: DNR Allergies Info: Amoxicillin-pot Clavulanate, Penicillins, Sulfonamide Derivatives           Current Medications (01/21/2021):  This is the current hospital active medication list Current Facility-Administered Medications  Medication Dose Route Frequency Provider Last Rate Last Admin   amiodarone (PACERONE) tablet 200 mg  200 mg Oral Daily Smith, Rondell A, MD   200 mg at 01/21/21 1101   ampicillin-sulbactam (UNASYN) 1.5 g in sodium chloride 0.9 % 100 mL IVPB  1.5 g Intravenous Q12H Arrien, Jimmy Picket, MD 200 mL/hr at 01/21/21 0544 1.5 g at 01/21/21 0544   apixaban (ELIQUIS) tablet 2.5 mg  2.5 mg Oral BID Fuller Plan A, MD   2.5 mg at 01/21/21 1101   atorvastatin (LIPITOR) tablet 20 mg  20 mg Oral q1800 Smith, Rondell A, MD       darifenacin (ENABLEX) 24 hr tablet 7.5 mg  7.5 mg Oral Daily Smith, Rondell A, MD   7.5 mg at 01/21/21 1101   dextrose 5 %-0.9 % sodium chloride infusion   Intravenous Continuous Arrien, Jimmy Picket, MD 50 mL/hr at 01/20/21 1639 New Bag at 01/20/21 1639   fluticasone (FLONASE) 50 MCG/ACT nasal spray 2 spray  2 spray Each Nare BID Arrien, Jimmy Picket, MD   2 spray at 01/21/21 1105   guaiFENesin (MUCINEX) 12 hr tablet 600 mg  600 mg Oral BID Fuller Plan A, MD   600 mg at 01/21/21 1101   guaiFENesin-dextromethorphan (ROBITUSSIN DM) 100-10 MG/5ML syrup 5 mL  5 mL Oral Q4H PRN Oswald Hillock, MD   5 mL at 01/21/21 1109   ipratropium-albuterol (DUONEB) 0.5-2.5 (3) MG/3ML nebulizer solution 3 mL  3 mL Nebulization Q2H PRN Arrien, Jimmy Picket, MD       ipratropium-albuterol (DUONEB) 0.5-2.5 (3)  MG/3ML nebulizer solution 3 mL  3 mL Nebulization TID Arrien, Jimmy Picket, MD   3 mL at 01/21/21 0733   isosorbide mononitrate (IMDUR) 24 hr tablet 60 mg  60 mg Oral Daily Smith, Rondell A, MD   60 mg at 01/21/21 1101   levothyroxine (SYNTHROID) tablet 62.5 mcg  62.5 mcg Oral Q0600 Fuller Plan A, MD   62.5 mcg at 01/21/21 0548   pantoprazole (PROTONIX) EC tablet 40 mg  40 mg Oral Daily Smith, Rondell A, MD   40 mg at 01/21/21 1101   polyethylene glycol (MIRALAX / GLYCOLAX) packet 17 g  17 g Oral Daily Tamala Julian, Rondell A, MD   17 g at 01/21/21 1105   saccharomyces boulardii (FLORASTOR) capsule 250 mg  250 mg Oral BID Fuller Plan A, MD   250 mg at 01/21/21 1101     Discharge Medications: Please see discharge summary for a list  of discharge medications.  Relevant Imaging Results:  Relevant Lab Results:   Additional Information SSN: [id number]; [place] COVID-19 Vaccine 05/01/2019 , 04/05/2019  Reece Agar, LCSWA

## 2021-01-21 NOTE — TOC Initial Note (Addendum)
Transition of Care [facility]) - Initial/Assessment Note    Patient Details  Name: Cathy [name] MRN: [id number] Date of Birth: 1927/09/03  Transition of Care [facility]) CM/SW Contact:    Tresa Endo Phone Number: 01/21/2021, 1:03 PM  Clinical Narrative:                 CSW spoke with pt relative Timoteo Ace about pt DC plan. Family would like pt to return to [facility] after this admission. CSW contacted admissions at [facility] to confirm pt return, no answer CSW will follow up when pt is closer to DC. Pt has not been seen by PT yet to return, CSW will follow up after she is closer to DC. Pt not medially stable.  CSW spoke with admissions at [facility], they will hold pt's bed. Pt would need a new insurance auth a new recommendation from SNF.  Expected Discharge Plan: Skilled Nursing Facility Barriers to Discharge: Continued Medical Work up   Patient Goals and CMS Choice Patient states their goals for this hospitalization and ongoing recovery are:: Rehab CMS Medicare.gov Compare Post Acute Care list provided to:: Patient Choice offered to / list presented to : Patient  Expected Discharge Plan and Services Expected Discharge Plan: [place] In-house Referral: Clinical Social Work   Post Acute Care Choice: [place] Living arrangements for the past 2 months: [place], [place]                                      Prior Living Arrangements/Services Living arrangements for the past 2 months: [place], [place] Lives with:: Self Patient language and need for interpreter reviewed:: Yes Do you feel safe going back to the place where you live?: Yes      Need for Family Participation in Patient Care: Yes (Comment) Care giver support system in place?: Yes (comment)   Criminal Activity/Legal Involvement Pertinent to Current Situation/Hospitalization: No - Comment as  needed  Activities of Daily Living Home Assistive Devices/Equipment: Eyeglasses ADL Screening (condition at time of admission) Patient's cognitive ability adequate to safely complete daily activities?: Yes Is the patient deaf or have difficulty hearing?: Yes Does the patient have difficulty seeing, even when wearing glasses/contacts?: Yes Does the patient have difficulty concentrating, remembering, or making decisions?: No Patient able to express need for assistance with ADLs?: Yes Does the patient have difficulty dressing or bathing?: No Independently performs ADLs?: Yes (appropriate for developmental age) Dressing (OT): Independent Grooming: Independent Feeding: Independent Bathing: Independent Toileting: Independent In/Out Bed: Independent Walks in Home: Independent Does the patient have difficulty walking or climbing stairs?: Yes Weakness of Legs: None Weakness of Arms/Hands: None  Permission Sought/Granted Permission sought to share information with : Family Supports, Chartered certified accountant granted to share information with : Yes, Verbal Permission Granted  Share Information with NAME: Myisha Pickerel  Permission granted to share info w AGENCY: SNF  Permission granted to share info w Relationship: Daughter in law  Permission granted to share info w Contact Information: [phone]  Emotional Assessment Appearance:: Appears stated age Attitude/Demeanor/Rapport: Engaged Affect (typically observed): Appropriate Orientation: : Oriented to Self, Oriented to Place, Oriented to  Time, Oriented to Situation Alcohol / Substance Use: Not Applicable Psych Involvement: No (comment)  Admission diagnosis:  Shortness of breath [R06.02] Hypoxia [R09.02] Acute respiratory failure with hypoxia ([place]) [J96.01] Community acquired  pneumonia, unspecified laterality [J18.9] Patient Active Problem List   Diagnosis Date Noted   Aspiration pneumonia ([place]) 01/19/2021    Malnutrition of moderate degree 01/09/2021   Acute on chronic respiratory failure with hypoxemia (HCC) 01/07/2021   Severe sepsis ([place]) 01/07/2021   Acute respiratory distress 01/06/2021   Community acquired pneumonia 01/06/2021   Heart failure with preserved ejection fraction ([place]) 01/06/2021   Hyponatremia 01/06/2021   SIRS (systemic inflammatory response syndrome) ([place]) 01/06/2021   Primary osteoarthritis of both hands 07/06/2019   Hip fracture ([place]) 06/29/2019   Closed fracture of neck of left femur ([place]) 06/28/2019   GERD without esophagitis 06/28/2019   Noninfected skin tear of leg, right, initial encounter 04/19/2018   Bowel obstruction ([place]) 02/21/2018   Hypothyroidism 02/21/2018   Osteoarthritis of left shoulder 08/10/2017   Pain in joint of left shoulder 08/10/2017   Mass of joint of left wrist 06/22/2017   Arthritis of left wrist 06/21/2017   Carpal tunnel syndrome 06/21/2017   Tenosynovitis of left wrist 06/21/2017   Fracture of distal end of humerus 03/25/2017   Closed supracondylar fracture of left humerus 01/15/2017   Gait disorder 01/15/2017   Recurrent falls 01/15/2017   Rib fractures 11/28/2016   Skin lesion 09/30/2016   Ganglion cyst of dorsum of left wrist 09/30/2016   Rotator cuff arthropathy, right 04/26/2016   Hyperglycemia 04/01/2016   Right arm pain 04/01/2016   Wrist swelling 09/30/2015   Preventative health care 04/02/2015   Chronic combined systolic and diastolic CHF (congestive heart failure) ([place]) 03/17/2015   Coronary artery calcification seen on CT scan 03/17/2015   Sinus bradycardia 03/17/2015   Splenic lesion 02/18/2015   Liver lesion 02/18/2015   Coronary artery disease involving native coronary artery of native heart without angina pectoris 02/03/2015   Atrial fibrillation and flutter ([place]) 02/02/2015   Fatigue 01/31/2015   Abnormal CT of the abdomen 01/31/2015   Prolonged Q-T interval on ECG 01/31/2015   Hepatic lesion    Chronic  constipation 12/03/2014   Lower extremity edema 04/25/2014   Peripheral neuropathy ([place]) 12/10/2013   Overactive bladder 12/27/2011   Senile osteoporosis 12/27/2011   Vitamin B12 deficiency 05/03/2010   Iron deficiency anemia 05/03/2010   Normocytic anemia 05/03/2010   VENOUS INSUFFICIENCY 01/27/2009   BACK PAIN, LUMBAR 01/27/2008   URINARY INCONTINENCE 08/23/2007   DIVERTICULOSIS OF COLON 04/10/2007   HYPERCHOLESTEROLEMIA 02/13/2007   ANXIETY 02/13/2007   Essential hypertension 02/13/2007   Diaphragmatic hernia 02/13/2007   Irritable bowel syndrome 02/13/2007   DEGENERATIVE JOINT DISEASE 02/13/2007   Allergic rhinitis 02/13/2007   PCP:  Biagio Borg, MD Pharmacy:  No Pharmacies Listed    Social Determinants of Health (SDOH) Interventions    Readmission Risk Interventions No flowsheet data found.

## 2021-01-21 NOTE — Plan of Care (Signed)
  Problem: Education: Goal: Knowledge of General Education information will improve Description: Including pain rating scale, medication(s)/side effects and non-pharmacologic comfort measures Outcome: Progressing   Problem: Health Behavior/Discharge Planning: Goal: Ability to manage health-related needs will improve Outcome: Progressing   Problem: Clinical Measurements: Goal: Ability to maintain clinical measurements within normal limits will improve Outcome: Progressing Goal: Respiratory complications will improve Outcome: Progressing   Problem: Activity: Goal: Risk for activity intolerance will decrease Outcome: Progressing   Problem: Coping: Goal: Level of anxiety will decrease Outcome: Progressing   Problem: Pain Managment: Goal: General experience of comfort will improve Outcome: Progressing   Problem: Skin Integrity: Goal: Risk for impaired skin integrity will decrease Outcome: Progressing   

## 2021-01-22 DIAGNOSIS — J690 Pneumonitis due to inhalation of food and vomit: Secondary | ICD-10-CM | POA: Diagnosis not present

## 2021-01-22 DIAGNOSIS — I4891 Unspecified atrial fibrillation: Secondary | ICD-10-CM | POA: Diagnosis not present

## 2021-01-22 DIAGNOSIS — J9621 Acute and chronic respiratory failure with hypoxia: Secondary | ICD-10-CM | POA: Diagnosis not present

## 2021-01-22 DIAGNOSIS — J189 Pneumonia, unspecified organism: Secondary | ICD-10-CM | POA: Diagnosis not present

## 2021-01-22 LAB — CBC
HCT: 29.7 % — ABNORMAL LOW (ref 36.0–46.0)
Hemoglobin: 9.6 g/dL — ABNORMAL LOW (ref 12.0–15.0)
MCH: 30.4 pg (ref 26.0–34.0)
MCHC: 32.3 g/dL (ref 30.0–36.0)
MCV: 94 fL (ref 80.0–100.0)
Platelets: 220 10*3/uL (ref 150–400)
RBC: 3.16 MIL/uL — ABNORMAL LOW (ref 3.87–5.11)
RDW: 15.3 % (ref 11.5–15.5)
WBC: 17.3 10*3/uL — ABNORMAL HIGH (ref 4.0–10.5)
nRBC: 0 % (ref 0.0–0.2)

## 2021-01-22 LAB — BASIC METABOLIC PANEL
Anion gap: 5 (ref 5–15)
BUN: 15 mg/dL (ref 8–23)
CO2: 26 mmol/L (ref 22–32)
Calcium: 8.5 mg/dL — ABNORMAL LOW (ref 8.9–10.3)
Chloride: 107 mmol/L (ref 98–111)
Creatinine, Ser: 0.79 mg/dL (ref 0.44–1.00)
GFR, Estimated: >60 mL/min (ref >60)
Glucose, Bld: 98 mg/dL (ref 70–99)
Potassium: 4.2 mmol/L (ref 3.5–5.1)
Sodium: 138 mmol/L (ref 135–145)

## 2021-01-22 MED ORDER — FUROSEMIDE 10 MG/ML IJ SOLN
40.0000 mg | Freq: Once | INTRAMUSCULAR | Status: AC
  Administered 2021-01-22: 40 mg via INTRAVENOUS
  Filled 2021-01-22: qty 4

## 2021-01-22 NOTE — Progress Notes (Signed)
[facility]  PROGRESS NOTE  CHRISTMAS FARACI BPZ:[id number] DOB: 24-Mar-1927 DOA: 01/19/2021 PCP: Biagio Borg, MD   Brief HPI:   87 year old female with medical history of hypertension, dyslipidemia, heart failure, paroxysmal atrial flutter, hiatal hernia, osteoarthritis presented with cough and dyspnea.  She had recent hospitalization from 01/06/2021 to 01/12/2021 for acute hypoxemic respiratory failure due to pneumonia and rhinovirus infection.  She received antibiotic therapy and was discharged to skilled nursing facility on supplemental oxygen 3 L/min. At facility she continued to have cough and was requiring 4 L of oxygen, O2 sats was 88%.  She was brought to the ED.  Chest x-ray showed new interstitial/alveolar infiltrate right upper lobe and left lower lobe.  Patient started on IV antibiotic therapy.    Subjective   Patient seen and examined, denies shortness of breath.   Assessment/Plan:    Acute hypoxemic respiratory failure -Secondary to multifocal pneumonia, right upper lobe and left lower lobe -Swallow evaluation shows no aspiration risk -Patient is currently on IV Unasyn -WBC down to 17,000  -Continue bronchodilator therapy  Chronic diastolic heart failure -Appears euvolemic -BNP 220 -Lasix currently on hold  Paroxysmal atrial flutter -Rate controlled; continue amiodarone -Continue apixaban for anticoagulation  Hypertension -Continue isosorbide -Blood pressure is stable  Hypothyroidism -Continue Synthroid  Dyslipidemia -Continue atorvastatin   Hypovolemic hyponatremia -Resolved    Medications     amiodarone  200 mg Oral Daily   apixaban  2.5 mg Oral BID   atorvastatin  20 mg Oral q1800   darifenacin  7.5 mg Oral Daily   fluticasone  2 spray Each Nare BID   guaiFENesin  600 mg Oral BID   ipratropium-albuterol  3 mL Nebulization TID   isosorbide mononitrate  60 mg Oral Daily   levothyroxine  62.5 mcg Oral Q0600   pantoprazole  40 mg Oral  Daily   polyethylene glycol  17 g Oral Daily   saccharomyces boulardii  250 mg Oral BID     Data Reviewed:   CBG:  Recent Labs  Lab 01/20/21 2139  GLUCAP 128*    SpO2: (!) 86 % O2 Flow Rate (L/min): 10 L/min FiO2 (%): 50 %    Vitals:   01/22/21 0840 01/22/21 1102 01/22/21 1423 01/22/21 1530  BP:  127/61  (!) 96/58  Pulse:  75 68 78  Resp:  19 (!) 23 19  Temp:  98 F (36.7 C)  98.6 F (37 C)  TempSrc:  Oral  Oral  SpO2: 100% (!) 87% 90% (!) 86%  Weight:      Height:         Intake/Output Summary (Last 24 hours) at 01/22/2021 1534 Last data filed at 01/22/2021 0749 Gross per 24 hour  Intake 700 ml  Output --  Net 700 ml    11/22 1901 - 11/24 0700 In: 580 [P.O.:180] Out: -   Filed Weights   01/19/21 1333  Weight: 48.5 kg    Data Reviewed: Basic Metabolic Panel: Recent Labs  Lab 01/19/21 1355 01/21/21 0201 01/22/21 1011  NA 131* 135 138  K 4.4 3.9 4.2  CL 97* 107 107  CO2 27 22 26   GLUCOSE 102* 394* 98  BUN 19 16 15   CREATININE 0.95 0.89 0.79  CALCIUM 8.9 7.7* 8.5*   Liver Function Tests: No results for input(s): AST, ALT, ALKPHOS, BILITOT, PROT, ALBUMIN in the last 168 hours. No results for input(s): LIPASE, AMYLASE in the last 168 hours. No results for input(s): AMMONIA in the last  168 hours. CBC: Recent Labs  Lab 01/19/21 1355 01/21/21 0201 01/22/21 1011  WBC 29.2* 20.7* 17.3*  NEUTROABS 27.4*  --   --   HGB 11.3* 8.8* 9.6*  HCT 35.4* 28.4* 29.7*  MCV 93.7 94.0 94.0  PLT 319 232 220   Cardiac Enzymes: No results for input(s): CKTOTAL, CKMB, CKMBINDEX, TROPONINI in the last 168 hours. BNP (last 3 results) Recent Labs    01/06/21 1025 01/19/21 1355 01/19/21 1653  BNP 591.6* 223.7* 220.0*    ProBNP (last 3 results) No results for input(s): PROBNP in the last 8760 hours.  CBG: Recent Labs  Lab 01/20/21 2139  GLUCAP 128*       Radiology Reports  No results found.     Antibiotics: Anti-infectives (From  admission, onward)    Start     Dose/Rate Route Frequency Ordered Stop   01/20/21 1800  ampicillin-sulbactam (UNASYN) 1.5 g in sodium chloride 0.9 % 100 mL IVPB        1.5 g 200 mL/hr over 30 Minutes Intravenous Every 12 hours 01/20/21 1417     01/20/21 1500  cefTRIAXone (ROCEPHIN) 2 g in sodium chloride 0.9 % 100 mL IVPB  Status:  Discontinued        2 g 200 mL/hr over 30 Minutes Intravenous Every 24 hours 01/19/21 1621 01/20/21 1414   01/19/21 2200  doxycycline (VIBRA-TABS) tablet 100 mg  Status:  Discontinued        100 mg Oral Every 12 hours 01/19/21 1621 01/20/21 1414   01/19/21 1445  cefTRIAXone (ROCEPHIN) 1 g in sodium chloride 0.9 % 100 mL IVPB        1 g 200 mL/hr over 30 Minutes Intravenous  Once 01/19/21 1443 01/19/21 1539   01/19/21 1445  doxycycline (VIBRA-TABS) tablet 100 mg        100 mg Oral  Once 01/19/21 1443 01/19/21 1508         DVT prophylaxis: Apixaban  Code Status: DNR  Family Communication: Discussed with grandson at bedside   Consultants: None  Procedures:     Objective    Physical Examination:   General-appears in no acute distress Heart-S1-S2, regular, no murmur auscultated Lungs-crackles in right upper and lower lung fields Abdomen-soft, nontender, no organomegaly Extremities-no edema in the lower extremities Neuro-alert, oriented x3, no focal deficit noted  Status is: Inpatient  Dispo: The patient is from: Skilled nursing facility              Anticipated d/c is to: Skilled nursing facility              Anticipated d/c date is: 01/26/2021              Patient currently not stable for discharge  Barrier to discharge-ongoing treatment for acute hypoxemic respiratory failure  COVID-19 Labs  No results for input(s): DDIMER, FERRITIN, LDH, CRP in the last 72 hours.  Lab Results  Component Value Date   SARSCOV2NAA NEGATIVE 01/19/2021   [place] NEGATIVE 01/12/2021   [place] NEGATIVE 01/09/2021   [place] NEGATIVE  01/06/2021            Recent Results (from the past 240 hour(s))  Culture, blood (routine x 2)     Status: None (Preliminary result)   Collection Time: 01/19/21  1:55 PM   Specimen: BLOOD RIGHT FOREARM  Result Value Ref Range Status   Specimen Description BLOOD RIGHT FOREARM  Final   Special Requests   Final    BOTTLES DRAWN AEROBIC AND  ANAEROBIC Blood Culture adequate volume   Culture   Final    NO GROWTH 3 DAYS Performed at [facility], [place] [address] [place], [place] [address]    Report Status PENDING  Incomplete  Resp Panel by RT-PCR (Flu A&B, Covid)     Status: None   Collection Time: 01/19/21  2:00 PM   Specimen: Nasopharyngeal(NP) swabs in vial transport medium  Result Value Ref Range Status   SARS Coronavirus 2 by RT PCR NEGATIVE NEGATIVE Final    Comment: (NOTE) SARS-CoV-2 target nucleic acids are NOT DETECTED.  The SARS-CoV-2 RNA is generally detectable in upper respiratory specimens during the acute phase of infection. The lowest concentration of SARS-CoV-2 viral copies this assay can detect is 138 copies/mL. A negative result does not preclude SARS-Cov-2 infection and should not be used as the sole basis for treatment or other patient management decisions. A negative result may occur with  improper specimen collection/handling, submission of specimen other than nasopharyngeal swab, presence of viral mutation(s) within the areas targeted by this assay, and inadequate number of viral copies(<138 copies/mL). A negative result must be combined with clinical observations, patient history, and epidemiological information. The expected result is Negative.  Fact Sheet for Patients:  EntrepreneurPulse.com.au  Fact Sheet for Healthcare Providers:  IncredibleEmployment.be  This test is no t yet approved or cleared by the [place] FDA and  has been authorized for detection and/or diagnosis of SARS-CoV-2 by FDA under  an Emergency Use Authorization (EUA). This EUA will remain  in effect (meaning this test can be used) for the duration of the COVID-19 declaration under Section 564(b)(1) of the Act, 21 U.S.C.section 360bbb-3(b)(1), unless the authorization is terminated  or revoked sooner.       Influenza A by PCR NEGATIVE NEGATIVE Final   Influenza B by PCR NEGATIVE NEGATIVE Final    Comment: (NOTE) The Xpert Xpress SARS-CoV-2/FLU/RSV plus assay is intended as an aid in the diagnosis of influenza from Nasopharyngeal swab specimens and should not be used as a sole basis for treatment. Nasal washings and aspirates are unacceptable for Xpert Xpress SARS-CoV-2/FLU/RSV testing.  Fact Sheet for Patients: EntrepreneurPulse.com.au  Fact Sheet for Healthcare Providers: IncredibleEmployment.be  This test is not yet approved or cleared by the [place] FDA and has been authorized for detection and/or diagnosis of SARS-CoV-2 by FDA under an Emergency Use Authorization (EUA). This EUA will remain in effect (meaning this test can be used) for the duration of the COVID-19 declaration under Section 564(b)(1) of the Act, 21 U.S.C. section 360bbb-3(b)(1), unless the authorization is terminated or revoked.  Performed at [facility], [place] [address], [place], [place] [address]   Culture, blood (routine x 2)     Status: None (Preliminary result)   Collection Time: 01/19/21  3:07 PM   Specimen: BLOOD  Result Value Ref Range Status   Specimen Description BLOOD LEFT ANTECUBITAL  Final   Special Requests   Final    BOTTLES DRAWN AEROBIC AND ANAEROBIC Blood Culture adequate volume   Culture   Final    NO GROWTH 3 DAYS Performed at [facility], [place] [address] [place], [place] [address]    Report Status PENDING  Incomplete  MRSA Next Gen by PCR, Nasal     Status: None   Collection Time: 01/20/21  3:51 PM   Specimen: Nasal Mucosa; Nasal Swab  Result Value Ref  Range Status   MRSA by PCR Next Gen NOT DETECTED NOT  DETECTED Final    Comment: (NOTE) The GeneXpert MRSA Assay (FDA approved for NASAL specimens only), is one component of a comprehensive MRSA colonization surveillance program. It is not intended to diagnose MRSA infection nor to guide or monitor treatment for MRSA infections. Test performance is not FDA approved in patients less than 16 years old. Performed at [facility], [place] [address] [place], [place] [address]     [facility] If 7PM-7AM, please contact night-coverage at [email or web address], Office  [phone]   01/22/2021, 3:34 PM  LOS: 3 days

## 2021-01-22 NOTE — Plan of Care (Signed)
  Problem: Education: Goal: Knowledge of General Education information will improve Description: Including pain rating scale, medication(s)/side effects and non-pharmacologic comfort measures Outcome: Progressing   Problem: Health Behavior/Discharge Planning: Goal: Ability to manage health-related needs will improve Outcome: Progressing   Problem: Clinical Measurements: Goal: Diagnostic test results will improve Outcome: Progressing Goal: Respiratory complications will improve Outcome: Progressing   Problem: Skin Integrity: Goal: Risk for impaired skin integrity will decrease Outcome: Progressing

## 2021-01-22 NOTE — Progress Notes (Signed)
RT called to patient's bedside to re-access patient's breathing. The patient had recently been moved from her bed to the chair. Afterwards the patient desated to 86% and the RN increased the O2 to 13L.  Upon entering patient's room her sats were 92%. Patient states she felt short of breath after the move but was feeling better now.  RN is contacting MD to make aware of O2 change and verify saturation goals.

## 2021-01-22 NOTE — Progress Notes (Signed)
On assessment this am, fine crackles heard, right lower base and both upper fields. Vitals have remained within what has been normal for her. Color appears a bit more pale today. Edema noted to lower legs bilaterally, not noted yesterday.   Oxygen requirement was at 8 liters HFNC at 0700. About 0800 had to turn it up to 10 after a coughing fit. At 1500 saturations were 86% and holding steady with good wave form. HFNC turned up to 13 which brought patient up to 88-91%.  RT called to come to bedside to conference about oxygen demand increase. RT heard crackles throughout lung bases as well.  MD paged with update and one dose lasix was ordered. Continue to monitor.

## 2021-01-22 NOTE — Plan of Care (Signed)

## 2021-01-23 ENCOUNTER — Inpatient Hospital Stay (HOSPITAL_COMMUNITY): Payer: Medicare Other

## 2021-01-23 DIAGNOSIS — J189 Pneumonia, unspecified organism: Secondary | ICD-10-CM | POA: Diagnosis not present

## 2021-01-23 DIAGNOSIS — I4891 Unspecified atrial fibrillation: Secondary | ICD-10-CM | POA: Diagnosis not present

## 2021-01-23 DIAGNOSIS — J690 Pneumonitis due to inhalation of food and vomit: Secondary | ICD-10-CM | POA: Diagnosis not present

## 2021-01-23 DIAGNOSIS — J9621 Acute and chronic respiratory failure with hypoxia: Secondary | ICD-10-CM | POA: Diagnosis not present

## 2021-01-23 LAB — CBC
HCT: 27.9 % — ABNORMAL LOW (ref 36.0–46.0)
Hemoglobin: 8.6 g/dL — ABNORMAL LOW (ref 12.0–15.0)
MCH: 29 pg (ref 26.0–34.0)
MCHC: 30.8 g/dL (ref 30.0–36.0)
MCV: 93.9 fL (ref 80.0–100.0)
Platelets: 232 10*3/uL (ref 150–400)
RBC: 2.97 MIL/uL — ABNORMAL LOW (ref 3.87–5.11)
RDW: 15.4 % (ref 11.5–15.5)
WBC: 17.5 10*3/uL — ABNORMAL HIGH (ref 4.0–10.5)
nRBC: 0 % (ref 0.0–0.2)

## 2021-01-23 NOTE — Progress Notes (Addendum)
Pt requires high O2 demand currently on 15L [place]. No respiratory distress noted. No bipap needed at this time. RT will continue to monitor.

## 2021-01-23 NOTE — Progress Notes (Signed)
Speech Language Pathology Treatment: Dysphagia  Patient Details Name: Cathy [name] MRN: [id number] DOB: 12-13-1927 Today's Date: 01/23/2021 Time: 0850-0910 SLP Time Calculation (min) (ACUTE ONLY): 20 min  Assessment / Plan / Recommendation Clinical Impression  Pt with dry cough at baseline; currently on 13 L HFNC; SP02 > 93%  Pt was repositioned and provided with toothbrush/toothpaste so she could complete her own oral care.  She accepted a few crackers and had 4 oz of thin water but declined further POs. She does not present with any overt s/sx of aspiration.  Cough persists but appears unrelated to swallowing.  Voice was strong; there was adequate coordination of swallow/respiratory cycles.    Her risk of aspiration will fluctuate depending upon overall medical status.  Based on clinical presentation during initial assessment 11/23 and today's session, aspiration does not appear to be contributing to her current condition.  Recommend continuing a regular diet to enhance food choices. Continue thin liquids.  Meds may be given whole in water or puree, per pt's preferences.  SLP will sign off.  D/W RN.    HPI HPI: 87 year old female with past medical history for hypertension, dyslipidemia, heart failure, paroxysmal atrial flutter, hiatal hernia and osteoarthritis who presented with cough and dyspnea.  Recent hospitalization 11/8-11/14 for acute hypoxic respiratory failure due to pneumonia and rhinovirus infection.  Dx acute hypoxemic respiratory failure due to multilobar pneumonia complicated with sepsis.      SLP Plan  Discharge SLP treatment due to (comment) - tolerating diet/goal met      Recommendations for follow up therapy are one component of a multi-disciplinary discharge planning process, led by the attending physician.  Recommendations may be updated based on patient status, additional functional criteria and insurance authorization.    Recommendations  Diet recommendations:  Regular;Thin liquid Liquids provided via: Cup;Straw Medication Administration: Whole meds with liquid Supervision: Staff to assist with self feeding Postural Changes and/or Swallow Maneuvers: Seated upright 90 degrees                Oral Care Recommendations: Oral care BID Follow Up Recommendations: No SLP follow up SLP Visit Diagnosis: Dysphagia, unspecified (R13.10) Plan: Discharge SLP treatment due to (comment)       GO              Cathy [name] Cathy [name], [place] Office number [phone] Pager [phone]   Cathy [name]  01/23/2021, 9:16 AM

## 2021-01-23 NOTE — Evaluation (Signed)
Physical Therapy Evaluation Patient Details Name: Cathy [name] MRN: [id number] DOB: 12-16-1927 Today's Date: 01/23/2021  History of Present Illness  87 year old female with medical history of hypertension, mild dementia, dyslipidemia, heart failure, paroxysmal atrial flutter, hiatal hernia, osteoarthritis presented with cough and dyspnea.  She had recent hospitalization from 01/06/2021 to 01/12/2021 for acute hypoxemic respiratory failure due to pneumonia and rhinovirus infection.  She received antibiotic therapy and was discharged to skilled nursing facility on supplemental oxygen 3 L/min.  Clinical Impression  Patient presents with decreased mobility due to weakness, decreased balance and decreased activity tolerance now requiring HFNC and still with intermittent desats into high 80's with OOB to chair and continued dry cough.  She will benefit from skilled PT in the acute setting and from SNF level rehab at d/c.      Recommendations for follow up therapy are one component of a multi-disciplinary discharge planning process, led by the attending physician.  Recommendations may be updated based on patient status, additional functional criteria and insurance authorization.  Follow Up Recommendations Skilled nursing-short term rehab (<3 hours/day)    Assistance Recommended at Discharge Frequent or constant Supervision/Assistance  Functional Status Assessment Patient has had a recent decline in their functional status and/or demonstrates limited ability to make significant improvements in function in a reasonable and predictable amount of time  Equipment Recommendations  None recommended by PT    Recommendations for Other Services       Precautions / Restrictions Precautions Precautions: Fall Precaution Comments: monitor SpO2; HOH Restrictions Weight Bearing Restrictions: No      Mobility  Bed Mobility Overal bed mobility: Needs Assistance Bed Mobility: Supine to Sit     Supine to  sit: HOB elevated;Min assist     General bed mobility comments: assist to lift trunk    Transfers Overall transfer level: Needs assistance Equipment used: Rolling walker (2 wheels) Transfers: Sit to/from Stand;Bed to chair/wheelchair/BSC Sit to Stand: Min assist   Step pivot transfers: Min assist       General transfer comment: assist up to RW from EOB, then stepping to recliner with A for balance and lines, on 12L O2 throughout    Ambulation/Gait                  Stairs            Wheelchair Mobility    Modified Rankin (Stroke Patients Only)       Balance Overall balance assessment: Needs assistance Sitting-balance support: Feet supported Sitting balance-Leahy Scale: Fair     Standing balance support: Bilateral upper extremity supported;Reliant on assistive device for balance Standing balance-Leahy Scale: Poor Standing balance comment: UE support needed and minguard A for balance                             Pertinent Vitals/Pain Pain Assessment: No/denies pain    Home Living Family/patient expects to be discharged to:: [place]: Conservation officer, nature (2 wheels);Tub bench;Hand held shower head;Rollator (4 wheels)      Prior Function Prior Level of Function : Needs assist             Mobility Comments: was home alone prior to recent hospitalization, then went to SNF; reports some therapy there, previously walked independent with RW, assist for tub transfers and meds and finances.  Hand Dominance        Extremity/Trunk Assessment   Upper Extremity Assessment Upper Extremity Assessment: Generalized weakness    Lower Extremity Assessment Lower Extremity Assessment: Generalized weakness    Cervical / Trunk Assessment Cervical / Trunk Assessment: Kyphotic  Communication   Communication: HOH  Cognition Arousal/Alertness: Awake/alert Behavior During Therapy: WFL for  tasks assessed/performed Overall Cognitive Status: History of cognitive impairments - at baseline                                 General Comments: did not remember name of facility she was at, but knew at [facility] comments (skin integrity, edema, etc.): SpO2 87-91% on 12L HFNC    Exercises     Assessment/Plan    PT Assessment Patient needs continued PT services  PT Problem List Decreased strength;Decreased mobility;Decreased balance;Decreased knowledge of use of DME;Cardiopulmonary status limiting activity;Decreased activity tolerance;Decreased knowledge of precautions       PT Treatment Interventions DME instruction;Therapeutic activities;Gait training;Therapeutic exercise;Patient/family education;Wheelchair mobility training;Balance training;Functional mobility training    PT Goals (Current goals can be found in the Care Plan section)  Acute Rehab PT Goals Patient Stated Goal: agreeable to return to facility PT Goal Formulation: With patient Time For Goal Achievement: 02/06/21 Potential to Achieve Goals: Fair    Frequency Min 2X/week   Barriers to discharge        Co-evaluation               AM-PAC PT "6 Clicks" Mobility  Outcome Measure Help needed turning from your back to your side while in a flat bed without using bedrails?: A Little Help needed moving from lying on your back to sitting on the side of a flat bed without using bedrails?: A Little Help needed moving to and from a bed to a chair (including a wheelchair)?: A Little Help needed standing up from a chair using your arms (e.g., wheelchair or bedside chair)?: A Little Help needed to walk in hospital room?: Total Help needed climbing 3-5 steps with a railing? : Total 6 Click Score: 14    End of Session Equipment Utilized During Treatment: Oxygen Activity Tolerance: Treatment limited secondary to medical complications (Comment) (on HFNC with intermittent  desats) Patient left: in chair;with call bell/phone within reach;with chair alarm set Nurse Communication: Mobility status PT Visit Diagnosis: Muscle weakness (generalized) (M62.81);Difficulty in walking, not elsewhere classified (R26.2)    Time: 0930-1006 PT Time Calculation (min) (ACUTE ONLY): 36 min   Charges:   PT Evaluation $PT Eval Moderate Complexity: 1 Mod PT Treatments $Therapeutic Activity: 8-22 mins        Magda Kiel, PT Acute Rehabilitation Services [id number]:[phone] Office:[phone] 01/23/2021   Reginia Naas 01/23/2021, 11:13 AM

## 2021-01-23 NOTE — TOC Progression Note (Signed)
Transition of Care [facility]) - Progression Note    Patient Details  Name: Cathy [name] MRN: [id number] Date of Birth: [birth year]  [facility]) CM/SW [place], [place] Phone Number: 01/23/2021, 4:29 PM  Clinical Narrative:    CSW following pt, pt not medically ready for DC.   Expected Discharge Plan: [place] Barriers to Discharge: Continued Medical Work up  Expected Discharge Plan and Services Expected Discharge Plan: [place] In-house Referral: Clinical Social Work   Post Acute Care Choice: [place] Living arrangements for the past 2 months: [place], [place]                                       Social Determinants of Health (SDOH) Interventions    Readmission Risk Interventions No flowsheet data found.

## 2021-01-23 NOTE — Progress Notes (Signed)
[facility]  PROGRESS NOTE  Cathy [name] QMG:[id number] DOB: April 17, 1927 DOA: 01/19/2021 PCP: Biagio Borg, MD   Brief HPI:   87 year old female with medical history of hypertension, dyslipidemia, heart failure, paroxysmal atrial flutter, hiatal hernia, osteoarthritis presented with cough and dyspnea.  She had recent hospitalization from 01/06/2021 to 01/12/2021 for acute hypoxemic respiratory failure due to pneumonia and rhinovirus infection.  She received antibiotic therapy and was discharged to skilled nursing facility on supplemental oxygen 3 L/min. At facility she continued to have cough and was requiring 4 L of oxygen, O2 sats was 88%.  She was brought to the ED.  Chest x-ray showed new interstitial/alveolar infiltrate right upper lobe and left lower lobe.  Patient started on IV antibiotic therapy.    Subjective   Patient seen and examined, denies shortness of breath.  Oxygen requirement went up to 13 L/min HFNC.  She was given Lasix 40 mg IV x1 yesterday.   Assessment/Plan:    Acute hypoxemic respiratory failure -Secondary to multifocal pneumonia, right upper lobe and left lower lobe -Swallow evaluation shows no aspiration risk -Patient is currently on IV Unasyn -WBC down to 17,000  -Continue bronchodilator therapy -We will obtain chest x-ray portable  Chronic diastolic heart failure -Appears euvolemic -BNP 220 -Lasix 40 mg IV x1 was given yesterday -No significant change in breathing, still requiring oxygen 13 L/min  Paroxysmal atrial flutter -Rate controlled; continue amiodarone -Continue apixaban for anticoagulation  Hypertension -Continue isosorbide -Blood pressure is stable  Hypothyroidism -Continue Synthroid  Dyslipidemia -Continue atorvastatin   Hypovolemic hyponatremia -Resolved  Anemia -Hemoglobin dropped from 11.3 on 11/21-8.6 today -No overt signs of bleeding -Check FOBT -Follow CBC in a.m.   Medications     amiodarone  200 mg Oral  Daily   apixaban  2.5 mg Oral BID   atorvastatin  20 mg Oral q1800   darifenacin  7.5 mg Oral Daily   fluticasone  2 spray Each Nare BID   guaiFENesin  600 mg Oral BID   ipratropium-albuterol  3 mL Nebulization TID   isosorbide mononitrate  60 mg Oral Daily   levothyroxine  62.5 mcg Oral Q0600   pantoprazole  40 mg Oral Daily   polyethylene glycol  17 g Oral Daily   saccharomyces boulardii  250 mg Oral BID     Data Reviewed:   CBG:  Recent Labs  Lab 01/20/21 2139  GLUCAP 128*    SpO2: 93 % O2 Flow Rate (L/min): 13 L/min FiO2 (%): 50 %    Vitals:   01/23/21 0323 01/23/21 0700 01/23/21 0800 01/23/21 1100  BP: 99/62 104/68 (!) 124/59 117/67  Pulse: 66 61 62 66  Resp:  (!) 23 (!) 22 (!) 22  Temp: 98.2 F (36.8 C) 98.3 F (36.8 C)  98.4 F (36.9 C)  TempSrc: Axillary Oral  Oral  SpO2: 97% 93% 94% 93%  Weight:      Height:         Intake/Output Summary (Last 24 hours) at 01/23/2021 1218 Last data filed at 01/23/2021 0600 Gross per 24 hour  Intake 200 ml  Output --  Net 200 ml    11/23 1901 - 11/25 0700 In: 720 [P.O.:120] Out: -   Filed Weights   01/19/21 1333  Weight: 48.5 kg    Data Reviewed: Basic Metabolic Panel: Recent Labs  Lab 01/19/21 1355 01/21/21 0201 01/22/21 1011  NA 131* 135 138  K 4.4 3.9 4.2  CL 97* 107 107  CO2 27 22  26  GLUCOSE 102* 394* 98  BUN 19 16 15   CREATININE 0.95 0.89 0.79  CALCIUM 8.9 7.7* 8.5*   Liver Function Tests: No results for input(s): AST, ALT, ALKPHOS, BILITOT, PROT, ALBUMIN in the last 168 hours. No results for input(s): LIPASE, AMYLASE in the last 168 hours. No results for input(s): AMMONIA in the last 168 hours. CBC: Recent Labs  Lab 01/19/21 1355 01/21/21 0201 01/22/21 1011 01/23/21 0106  WBC 29.2* 20.7* 17.3* 17.5*  NEUTROABS 27.4*  --   --   --   HGB 11.3* 8.8* 9.6* 8.6*  HCT 35.4* 28.4* 29.7* 27.9*  MCV 93.7 94.0 94.0 93.9  PLT 319 232 220 232   Cardiac Enzymes: No results for input(s):  CKTOTAL, CKMB, CKMBINDEX, TROPONINI in the last 168 hours. BNP (last 3 results) Recent Labs    01/06/21 1025 01/19/21 1355 01/19/21 1653  BNP 591.6* 223.7* 220.0*    ProBNP (last 3 results) No results for input(s): PROBNP in the last 8760 hours.  CBG: Recent Labs  Lab 01/20/21 2139  GLUCAP 128*       Radiology Reports  DG Chest Port 1V same Day  Result Date: 01/23/2021 CLINICAL DATA:  Dyspnea EXAM: PORTABLE CHEST 1 VIEW COMPARISON:  01/19/2021 FINDINGS: Patient is significantly rotated. Low lung volumes. Persistent bilateral pulmonary opacities. Probable bilateral pleural effusions. Similar cardiomediastinal contours. Large hiatal hernia. IMPRESSION: Low lung volumes with persistent bilateral pulmonary opacities and probable bilateral pleural effusions. May reflect edema or pneumonia. Electronically Signed   By: Macy Mis M.D.   On: 01/23/2021 12:09       Antibiotics: Anti-infectives (From admission, onward)    Start     Dose/Rate Route Frequency Ordered Stop   01/20/21 1800  ampicillin-sulbactam (UNASYN) 1.5 g in sodium chloride 0.9 % 100 mL IVPB        1.5 g 200 mL/hr over 30 Minutes Intravenous Every 12 hours 01/20/21 1417     01/20/21 1500  cefTRIAXone (ROCEPHIN) 2 g in sodium chloride 0.9 % 100 mL IVPB  Status:  Discontinued        2 g 200 mL/hr over 30 Minutes Intravenous Every 24 hours 01/19/21 1621 01/20/21 1414   01/19/21 2200  doxycycline (VIBRA-TABS) tablet 100 mg  Status:  Discontinued        100 mg Oral Every 12 hours 01/19/21 1621 01/20/21 1414   01/19/21 1445  cefTRIAXone (ROCEPHIN) 1 g in sodium chloride 0.9 % 100 mL IVPB        1 g 200 mL/hr over 30 Minutes Intravenous  Once 01/19/21 1443 01/19/21 1539   01/19/21 1445  doxycycline (VIBRA-TABS) tablet 100 mg        100 mg Oral  Once 01/19/21 1443 01/19/21 1508         DVT prophylaxis: Apixaban  Code Status: DNR  Family Communication: Discussed with grandson at  bedside   Consultants: None  Procedures:     Objective    Physical Examination:   General-appears in no acute distress Heart-S1-S2, regular, no murmur auscultated Lungs-crackles auscultated in right upper and lower lung fields Abdomen-soft, nontender, no organomegaly Extremities-no edema in the lower extremities Neuro-alert, oriented x3, no focal deficit noted  Status is: Inpatient  Dispo: The patient is from: Skilled nursing facility              Anticipated d/c is to: Skilled nursing facility              Anticipated d/c date is: 01/26/2021  Patient currently not stable for discharge  Barrier to discharge-ongoing treatment for acute hypoxemic respiratory failure  COVID-19 Labs  No results for input(s): DDIMER, FERRITIN, LDH, CRP in the last 72 hours.  Lab Results  Component Value Date   SARSCOV2NAA NEGATIVE 01/19/2021   [place] NEGATIVE 01/12/2021   [place] NEGATIVE 01/09/2021   [place] NEGATIVE 01/06/2021            Recent Results (from the past 240 hour(s))  Culture, blood (routine x 2)     Status: None (Preliminary result)   Collection Time: 01/19/21  1:55 PM   Specimen: BLOOD RIGHT FOREARM  Result Value Ref Range Status   Specimen Description BLOOD RIGHT FOREARM  Final   Special Requests   Final    BOTTLES DRAWN AEROBIC AND ANAEROBIC Blood Culture adequate volume   Culture   Final    NO GROWTH 3 DAYS Performed at [facility], [address], [place], [place] [address]    Report Status PENDING  Incomplete  Resp Panel by RT-PCR (Flu A&B, Covid)     Status: None   Collection Time: 01/19/21  2:00 PM   Specimen: Nasopharyngeal(NP) swabs in vial transport medium  Result Value Ref Range Status   SARS Coronavirus 2 by RT PCR NEGATIVE NEGATIVE Final    Comment: (NOTE) SARS-CoV-2 target nucleic acids are NOT DETECTED.  The SARS-CoV-2 RNA is generally detectable in upper respiratory specimens during the acute phase of  infection. The lowest concentration of SARS-CoV-2 viral copies this assay can detect is 138 copies/mL. A negative result does not preclude SARS-Cov-2 infection and should not be used as the sole basis for treatment or other patient management decisions. A negative result may occur with  improper specimen collection/handling, submission of specimen other than nasopharyngeal swab, presence of viral mutation(s) within the areas targeted by this assay, and inadequate number of viral copies(<138 copies/mL). A negative result must be combined with clinical observations, patient history, and epidemiological information. The expected result is Negative.  Fact Sheet for Patients:  EntrepreneurPulse.com.au  Fact Sheet for Healthcare Providers:  IncredibleEmployment.be  This test is no t yet approved or cleared by the [place] FDA and  has been authorized for detection and/or diagnosis of SARS-CoV-2 by FDA under an Emergency Use Authorization (EUA). This EUA will remain  in effect (meaning this test can be used) for the duration of the COVID-19 declaration under Section 564(b)(1) of the Act, 21 U.S.C.section 360bbb-3(b)(1), unless the authorization is terminated  or revoked sooner.       Influenza A by PCR NEGATIVE NEGATIVE Final   Influenza B by PCR NEGATIVE NEGATIVE Final    Comment: (NOTE) The Xpert Xpress SARS-CoV-2/FLU/RSV plus assay is intended as an aid in the diagnosis of influenza from Nasopharyngeal swab specimens and should not be used as a sole basis for treatment. Nasal washings and aspirates are unacceptable for Xpert Xpress SARS-CoV-2/FLU/RSV testing.  Fact Sheet for Patients: EntrepreneurPulse.com.au  Fact Sheet for Healthcare Providers: IncredibleEmployment.be  This test is not yet approved or cleared by the [place] FDA and has been authorized for detection and/or diagnosis of SARS-CoV-2  by FDA under an Emergency Use Authorization (EUA). This EUA will remain in effect (meaning this test can be used) for the duration of the COVID-19 declaration under Section 564(b)(1) of the Act, 21 U.S.C. section 360bbb-3(b)(1), unless the authorization is terminated or revoked.  Performed at [facility], [place] [address], [place], [place] [address]   Culture, blood (  routine x 2)     Status: None (Preliminary result)   Collection Time: 01/19/21  3:07 PM   Specimen: BLOOD  Result Value Ref Range Status   Specimen Description BLOOD LEFT ANTECUBITAL  Final   Special Requests   Final    BOTTLES DRAWN AEROBIC AND ANAEROBIC Blood Culture adequate volume   Culture   Final    NO GROWTH 3 DAYS Performed at [facility], [address], [place], [place] [address]    Report Status PENDING  Incomplete  MRSA Next Gen by PCR, Nasal     Status: None   Collection Time: 01/20/21  3:51 PM   Specimen: Nasal Mucosa; Nasal Swab  Result Value Ref Range Status   MRSA by PCR Next Gen NOT DETECTED NOT DETECTED Final    Comment: (NOTE) The GeneXpert MRSA Assay (FDA approved for NASAL specimens only), is one component of a comprehensive MRSA colonization surveillance program. It is not intended to diagnose MRSA infection nor to guide or monitor treatment for MRSA infections. Test performance is not FDA approved in patients less than 41 years old. Performed at [facility], [place] [address] [place], [place] [address]     [facility] If 7PM-7AM, please contact night-coverage at [email or web address], Office  [phone]   01/23/2021, 12:18 PM  LOS: 4 days

## 2021-01-24 DIAGNOSIS — I4891 Unspecified atrial fibrillation: Secondary | ICD-10-CM | POA: Diagnosis not present

## 2021-01-24 DIAGNOSIS — J9621 Acute and chronic respiratory failure with hypoxia: Secondary | ICD-10-CM

## 2021-01-24 DIAGNOSIS — J189 Pneumonia, unspecified organism: Secondary | ICD-10-CM | POA: Diagnosis not present

## 2021-01-24 DIAGNOSIS — E039 Hypothyroidism, unspecified: Secondary | ICD-10-CM | POA: Diagnosis not present

## 2021-01-24 LAB — CULTURE, BLOOD (ROUTINE X 2)
Culture: NO GROWTH
Culture: NO GROWTH
Special Requests: ADEQUATE
Special Requests: ADEQUATE

## 2021-01-24 LAB — CBC
HCT: 31.2 % — ABNORMAL LOW (ref 36.0–46.0)
Hemoglobin: 9.7 g/dL — ABNORMAL LOW (ref 12.0–15.0)
MCH: 29.3 pg (ref 26.0–34.0)
MCHC: 31.1 g/dL (ref 30.0–36.0)
MCV: 94.3 fL (ref 80.0–100.0)
Platelets: 241 10*3/uL (ref 150–400)
RBC: 3.31 MIL/uL — ABNORMAL LOW (ref 3.87–5.11)
RDW: 15 % (ref 11.5–15.5)
WBC: 15.9 10*3/uL — ABNORMAL HIGH (ref 4.0–10.5)
nRBC: 0 % (ref 0.0–0.2)

## 2021-01-24 LAB — BASIC METABOLIC PANEL
Anion gap: 7 (ref 5–15)
BUN: 15 mg/dL (ref 8–23)
CO2: 26 mmol/L (ref 22–32)
Calcium: 8.3 mg/dL — ABNORMAL LOW (ref 8.9–10.3)
Chloride: 104 mmol/L (ref 98–111)
Creatinine, Ser: 0.71 mg/dL (ref 0.44–1.00)
GFR, Estimated: >60 mL/min (ref >60)
Glucose, Bld: 83 mg/dL (ref 70–99)
Potassium: 3.9 mmol/L (ref 3.5–5.1)
Sodium: 137 mmol/L (ref 135–145)

## 2021-01-24 LAB — BRAIN NATRIURETIC PEPTIDE: B Natriuretic Peptide: 273.6 pg/mL — ABNORMAL HIGH (ref 0.0–100.0)

## 2021-01-24 MED ORDER — IPRATROPIUM-ALBUTEROL 0.5-2.5 (3) MG/3ML IN SOLN
3.0000 mL | Freq: Two times a day (BID) | RESPIRATORY_TRACT | Status: DC
  Administered 2021-01-24: 3 mL via RESPIRATORY_TRACT
  Filled 2021-01-24: qty 3

## 2021-01-24 MED ORDER — POTASSIUM CHLORIDE CRYS ER 20 MEQ PO TBCR
30.0000 meq | EXTENDED_RELEASE_TABLET | Freq: Once | ORAL | Status: AC
  Administered 2021-01-24: 30 meq via ORAL
  Filled 2021-01-24: qty 1

## 2021-01-24 MED ORDER — FUROSEMIDE 10 MG/ML IJ SOLN
40.0000 mg | Freq: Once | INTRAMUSCULAR | Status: AC
  Administered 2021-01-24: 40 mg via INTRAVENOUS
  Filled 2021-01-24: qty 4

## 2021-01-24 MED ORDER — POTASSIUM CHLORIDE CRYS ER 20 MEQ PO TBCR: 30.0000 meq | EXTENDED_RELEASE_TABLET | Freq: Two times a day (BID) | ORAL | Status: DC

## 2021-01-24 MED ORDER — IPRATROPIUM-ALBUTEROL 0.5-2.5 (3) MG/3ML IN SOLN
3.0000 mL | Freq: Four times a day (QID) | RESPIRATORY_TRACT | Status: DC
  Administered 2021-01-24 – 2021-01-28 (×15): 3 mL via RESPIRATORY_TRACT
  Filled 2021-01-24 (×16): qty 3

## 2021-01-24 NOTE — Progress Notes (Signed)
Mobility Specialist Progress Note:   01/24/21 0919  Mobility  Activity Transferred:  Bed to chair  Level of Assistance Contact guard assist, steadying assist  Assistive Device Other (Comment) (HHA)  Distance Ambulated (ft) 4 ft  Mobility Ambulated with assistance in room  Mobility Response Tolerated well  Mobility performed by Mobility specialist  Bed Position Chair  $Mobility charge 1 Mobility   Pt received in bed willing to participate in mobility. No complaints of pain. Pt left in chair with call bell in reach and all needs met.     Mobility Specialist Primary Phone [phone] Secondary Phone [phone]  

## 2021-01-24 NOTE — Progress Notes (Addendum)
[facility]  PROGRESS NOTE  Cathy [name] OIB:[id number] DOB: 06-30-1927 DOA: 01/19/2021 PCP: Biagio Borg, MD   Brief HPI:   87 year old female with medical history of hypertension, dyslipidemia, heart failure, paroxysmal atrial flutter, hiatal hernia, osteoarthritis presented with cough and dyspnea.  She had recent hospitalization from 01/06/2021 to 01/12/2021 for acute hypoxemic respiratory failure due to pneumonia and rhinovirus infection.  She received antibiotic therapy and was discharged to skilled nursing facility on supplemental oxygen 3 L/min. At facility she continued to have cough and was requiring 4 L of oxygen, O2 sats was 88%.  She was brought to the ED.  Chest x-ray showed new interstitial/alveolar infiltrate right upper lobe and left lower lobe.  Patient started on IV antibiotic therapy.    Subjective   Patient seen and examined, oxygen requirement continues to go up today requiring 15 L/min HFNC.  Appears lethargic today.   Assessment/Plan:    Acute hypoxemic respiratory failure -Secondary to multifocal pneumonia, right upper lobe and left lower lobe -Swallow evaluation shows no aspiration  -Patient is currently on IV Unasyn -WBC down to 15.9 -Continue bronchodilator therapy -Chest x-ray shows persistent infiltrates on right side, also possible pulmonary edema -She received Lasix 40 mg IV x1 with normal significant response -We will repeat BMP and give additional Lasix 40 mg IV x1 -We will also consult pulmonology for further evaluation and recommendation  Acute on chronic diastolic heart failure -BNP on admission was 220 -We will repeat BNP -Lasix 40 mg IV x1 given as above  Paroxysmal atrial flutter -Rate controlled; continue amiodarone -Continue apixaban for anticoagulation  Hypertension -Continue isosorbide -Blood pressure is stable  Hypothyroidism -Continue Synthroid  Dyslipidemia -Continue atorvastatin  Hypovolemic  hyponatremia -Resolved  Anemia -Hemoglobin dropped from 11.3 on 11/21 to 9.7 today -No overt signs of bleeding -FOBT ordered -Follow CBC in a.m.  Goals of care -Called and discussed with daughter that patient's oxygen requirement has gone up consistently since hospitalization despite treatment with IV antibiotics.  Patient is DNR/DNI.  If patient does not do well over the next few days then consider comfort measures.  Medications     amiodarone  200 mg Oral Daily   apixaban  2.5 mg Oral BID   atorvastatin  20 mg Oral q1800   darifenacin  7.5 mg Oral Daily   fluticasone  2 spray Each Nare BID   guaiFENesin  600 mg Oral BID   ipratropium-albuterol  3 mL Nebulization BID   isosorbide mononitrate  60 mg Oral Daily   levothyroxine  62.5 mcg Oral Q0600   pantoprazole  40 mg Oral Daily   polyethylene glycol  17 g Oral Daily   saccharomyces boulardii  250 mg Oral BID     Data Reviewed:   CBG:  Recent Labs  Lab 01/20/21 2139  GLUCAP 128*    SpO2: 92 % O2 Flow Rate (L/min): 15 L/min FiO2 (%): 50 %    Vitals:   01/23/21 2300 01/24/21 0300 01/24/21 0800 01/24/21 0823  BP: 110/60 (!) 103/54 128/74   Pulse: 63 (!) 59 68   Resp: 16 20 (!) 22   Temp: 97.9 F (36.6 C) 97.6 F (36.4 C) 97.6 F (36.4 C)   TempSrc: Oral Axillary Oral   SpO2: 98% 95% 92% 92%  Weight:      Height:         Intake/Output Summary (Last 24 hours) at 01/24/2021 0942 Last data filed at 01/24/2021 0830 Gross per 24 hour  Intake 570.63  ml  Output 450 ml  Net 120.63 ml    11/24 1901 - 11/26 0700 In: 890.6 [P.O.:480] Out: 450 [Urine:450]  Filed Weights   01/19/21 1333  Weight: 48.5 kg    Data Reviewed: Basic Metabolic Panel: Recent Labs  Lab 01/19/21 1355 01/21/21 0201 01/22/21 1011 01/24/21 0054  NA 131* 135 138 137  K 4.4 3.9 4.2 3.9  CL 97* 107 107 104  CO2 27 22 26 26   GLUCOSE 102* 394* 98 83  BUN 19 16 15 15   CREATININE 0.95 0.89 0.79 0.71  CALCIUM 8.9 7.7* 8.5* 8.3*    Liver Function Tests: No results for input(s): AST, ALT, ALKPHOS, BILITOT, PROT, ALBUMIN in the last 168 hours. No results for input(s): LIPASE, AMYLASE in the last 168 hours. No results for input(s): AMMONIA in the last 168 hours. CBC: Recent Labs  Lab 01/19/21 1355 01/21/21 0201 01/22/21 1011 01/23/21 0106 01/24/21 0054  WBC 29.2* 20.7* 17.3* 17.5* 15.9*  NEUTROABS 27.4*  --   --   --   --   HGB 11.3* 8.8* 9.6* 8.6* 9.7*  HCT 35.4* 28.4* 29.7* 27.9* 31.2*  MCV 93.7 94.0 94.0 93.9 94.3  PLT 319 232 220 232 241   Cardiac Enzymes: No results for input(s): CKTOTAL, CKMB, CKMBINDEX, TROPONINI in the last 168 hours. BNP (last 3 results) Recent Labs    01/06/21 1025 01/19/21 1355 01/19/21 1653  BNP 591.6* 223.7* 220.0*    ProBNP (last 3 results) No results for input(s): PROBNP in the last 8760 hours.  CBG: Recent Labs  Lab 01/20/21 2139  GLUCAP 128*       Radiology Reports  DG Chest Port 1V same Day  Result Date: 01/23/2021 CLINICAL DATA:  Dyspnea EXAM: PORTABLE CHEST 1 VIEW COMPARISON:  01/19/2021 FINDINGS: Patient is significantly rotated. Low lung volumes. Persistent bilateral pulmonary opacities. Probable bilateral pleural effusions. Similar cardiomediastinal contours. Large hiatal hernia. IMPRESSION: Low lung volumes with persistent bilateral pulmonary opacities and probable bilateral pleural effusions. May reflect edema or pneumonia. Electronically Signed   By: Macy Mis M.D.   On: 01/23/2021 12:09       Antibiotics: Anti-infectives (From admission, onward)    Start     Dose/Rate Route Frequency Ordered Stop   01/20/21 1800  ampicillin-sulbactam (UNASYN) 1.5 g in sodium chloride 0.9 % 100 mL IVPB        1.5 g 200 mL/hr over 30 Minutes Intravenous Every 12 hours 01/20/21 1417     01/20/21 1500  cefTRIAXone (ROCEPHIN) 2 g in sodium chloride 0.9 % 100 mL IVPB  Status:  Discontinued        2 g 200 mL/hr over 30 Minutes Intravenous Every 24 hours  01/19/21 1621 01/20/21 1414   01/19/21 2200  doxycycline (VIBRA-TABS) tablet 100 mg  Status:  Discontinued        100 mg Oral Every 12 hours 01/19/21 1621 01/20/21 1414   01/19/21 1445  cefTRIAXone (ROCEPHIN) 1 g in sodium chloride 0.9 % 100 mL IVPB        1 g 200 mL/hr over 30 Minutes Intravenous  Once 01/19/21 1443 01/19/21 1539   01/19/21 1445  doxycycline (VIBRA-TABS) tablet 100 mg        100 mg Oral  Once 01/19/21 1443 01/19/21 1508         DVT prophylaxis: Apixaban  Code Status: DNR  Family Communication: Discussed with daughter on phone   Consultants: None  Procedures:     Objective    Physical  Examination:   General-appears  lethargic today Heart-S1-S2, regular, no murmur auscultated Lungs-crackles auscultated right upper and lower lung fields Abdomen-soft, nontender, no organomegaly Extremities-no edema in the lower extremities Neuro-alert, oriented to self only  Status is: Inpatient  Dispo: The patient is from: Skilled nursing facility              Anticipated d/c is to: Skilled nursing facility              Anticipated d/c date is: 01/26/2021              Patient currently not stable for discharge  Barrier to discharge-ongoing treatment for acute hypoxemic respiratory failure  COVID-19 Labs  No results for input(s): DDIMER, FERRITIN, LDH, CRP in the last 72 hours.  Lab Results  Component Value Date   SARSCOV2NAA NEGATIVE 01/19/2021   [place] NEGATIVE 01/12/2021   [place] NEGATIVE 01/09/2021   [place] NEGATIVE 01/06/2021            Recent Results (from the past 240 hour(s))  Culture, blood (routine x 2)     Status: None   Collection Time: 01/19/21  1:55 PM   Specimen: BLOOD RIGHT FOREARM  Result Value Ref Range Status   Specimen Description BLOOD RIGHT FOREARM  Final   Special Requests   Final    BOTTLES DRAWN AEROBIC AND ANAEROBIC Blood Culture adequate volume   Culture   Final    NO GROWTH 5 DAYS Performed at  [facility], [address], [place], [place] [address]    Report Status 01/24/2021 FINAL  Final  Resp Panel by RT-PCR (Flu A&B, Covid)     Status: None   Collection Time: 01/19/21  2:00 PM   Specimen: Nasopharyngeal(NP) swabs in vial transport medium  Result Value Ref Range Status   SARS Coronavirus 2 by RT PCR NEGATIVE NEGATIVE Final    Comment: (NOTE) SARS-CoV-2 target nucleic acids are NOT DETECTED.  The SARS-CoV-2 RNA is generally detectable in upper respiratory specimens during the acute phase of infection. The lowest concentration of SARS-CoV-2 viral copies this assay can detect is 138 copies/mL. A negative result does not preclude SARS-Cov-2 infection and should not be used as the sole basis for treatment or other patient management decisions. A negative result may occur with  improper specimen collection/handling, submission of specimen other than nasopharyngeal swab, presence of viral mutation(s) within the areas targeted by this assay, and inadequate number of viral copies(<138 copies/mL). A negative result must be combined with clinical observations, patient history, and epidemiological information. The expected result is Negative.  Fact Sheet for Patients:  EntrepreneurPulse.com.au  Fact Sheet for Healthcare Providers:  IncredibleEmployment.be  This test is no t yet approved or cleared by the [place] FDA and  has been authorized for detection and/or diagnosis of SARS-CoV-2 by FDA under an Emergency Use Authorization (EUA). This EUA will remain  in effect (meaning this test can be used) for the duration of the COVID-19 declaration under Section 564(b)(1) of the Act, 21 U.S.C.section 360bbb-3(b)(1), unless the authorization is terminated  or revoked sooner.       Influenza A by PCR NEGATIVE NEGATIVE Final   Influenza B by PCR NEGATIVE NEGATIVE Final    Comment: (NOTE) The Xpert Xpress SARS-CoV-2/FLU/RSV plus assay  is intended as an aid in the diagnosis of influenza from Nasopharyngeal swab specimens and should not be used as a sole basis for treatment. Nasal washings and aspirates are unacceptable for Xpert Xpress SARS-CoV-2/FLU/RSV testing.  Fact Sheet for Patients: EntrepreneurPulse.com.au  Fact Sheet for Healthcare Providers: IncredibleEmployment.be  This test is not yet approved or cleared by the [place] FDA and has been authorized for detection and/or diagnosis of SARS-CoV-2 by FDA under an Emergency Use Authorization (EUA). This EUA will remain in effect (meaning this test can be used) for the duration of the COVID-19 declaration under Section 564(b)(1) of the Act, 21 U.S.C. section 360bbb-3(b)(1), unless the authorization is terminated or revoked.  Performed at [facility], [place] [address] [place], [place] [address]   Culture, blood (routine x 2)     Status: None   Collection Time: 01/19/21  3:07 PM   Specimen: BLOOD  Result Value Ref Range Status   Specimen Description BLOOD LEFT ANTECUBITAL  Final   Special Requests   Final    BOTTLES DRAWN AEROBIC AND ANAEROBIC Blood Culture adequate volume   Culture   Final    NO GROWTH 5 DAYS Performed at [facility], [place] [address], [place], [place] [address]    Report Status 01/24/2021 FINAL  Final  MRSA Next Gen by PCR, Nasal     Status: None   Collection Time: 01/20/21  3:51 PM   Specimen: Nasal Mucosa; Nasal Swab  Result Value Ref Range Status   MRSA by PCR Next Gen NOT DETECTED NOT DETECTED Final    Comment: (NOTE) The GeneXpert MRSA Assay (FDA approved for NASAL specimens only), is one component of a comprehensive MRSA colonization surveillance program. It is not intended to diagnose MRSA infection nor to guide or monitor treatment for MRSA infections. Test performance is not FDA approved in patients less than 55 years old. Performed at [facility], [place] [address], [place], [place] [address]     [facility] If 7PM-7AM, please contact night-coverage at [email or web address], Office  [phone]   01/24/2021, 9:42 AM  LOS: 5 days

## 2021-01-24 NOTE — Consult Note (Signed)
NAME:  Cathy [name], MRN:  [id number], DOB:  1927/08/14, LOS: 5 ADMISSION DATE:  01/19/2021, CONSULTATION DATE:  01/24/2021 REFERRING MD:  Dr. Darrick Meigs, CHIEF COMPLAINT:  Hypoxia/ PNA   History of Present Illness:  87 year old female with prior history as below significant for diastolic HF, HTN, PAF on Eliquis, and large hiatal hernia who underwent recent hospitalization 11/8 to 11/14 for acute hypoxic respiratory failure secondary to pneumonia and rhinovirus discharged to SNF on 3L [place].   She presented back on 11/21 with continued cough with white sputum and dyspnea, found to be hypoxic at 88% on 4L with CXR showing new interstitial/ alveolar infiltrates in right upper and left lower lobe.  She has been afebrile with leukocytosis of 29k on admit.  She was started on ceftriaxone and doxycyline and admitted to [facility], since transitioned to Unasyn.  WBC is now down to 15.9 and remains afebrile, but has worsening O2 requirements now on 15L.  She was given lasix 40mg  on 11/24 without any improvement for BNP 220 but not felt to be hypervolemic clinically.  Repeat BNP today 273 and additional dose of lasix ordered.  Procalcitonin has remained normal.  Underwent swallow evaluation 11/25 which showed no aspiration.   Family denies any prior lung issues or history.  Reports she quit smoking over 40 years ago.  Denies any ongoing sinus issues.  She is DNR/ DNI.  Pulmonary consulted for further recommendations.   Pertinent  Medical History  HFpEF, HTN, HLD, PAF, hiatal hernia, OA  Significant Hospital Events: Including procedures, antibiotic start and stop dates in addition to other pertinent events   11/8 > 11/14 hospitalized with PNA/ rhinovirus, d/c to SNF on 3L, 6 days of azithro/ ceftriaxone 11/21 admitted to [facility] hypoxic resp failure/ PNA, started on doxy/ ceftriaxone 11/22 changed to unasyn, last dose of doxy 11/25 neg SLP   11/8 urine strep/ legionella neg 11/8 RVP > +rhino/ enterovirus   11/21 SARS/  flu > neg 11/22 MRSA PCR > neg 11/21 Bcx 2 > neg 11/22 urine strep > neg  Interim History / Subjective:   Objective   Blood pressure 128/74, pulse 68, temperature 97.6 F (36.4 C), temperature source Oral, resp. rate (!) 22, height 4\' 7"  (1.397 m), weight 48.5 kg, SpO2 92 %.        Intake/Output Summary (Last 24 hours) at 01/24/2021 1038 Last data filed at 01/24/2021 0830 Gross per 24 hour  Intake 570.63 ml  Output 450 ml  Net 120.63 ml   Filed Weights   01/19/21 1333  Weight: 48.5 kg   Examination: General:  Frail appearing elderly female sitting in bedside recliner, no obvious distress Neuro:  Awake, appropriate, MAE CV: NSR PULM:  tachypniec in the 20's, non labored, np cough Extremities:  no LE edema   Remainder of exam per Dr. Erin Fulling  [facility] Problem list    Assessment & Plan:   Acute on chronic hypoxic respiratory failure in the setting of multifocal opacities - suspected to be multifactorial.  She has known massive hiatal hernia noted to be intrathoracic and moderate layering right effusion on CT in 2019, which likely causing silent aspiration, exacerbated by her preference of sleeping flat.  No functional dysphagia on SLP 11/25.  She has known grade 3 diastolic HF on TTE in 7253 with moderate calcified MV and PASP noted at 39 with increasing BNP this admission which could be contributing as well.  Also with recent hospitalization for pneumonia and viral infection, discharged  to SNF on 3L [place].  She continues to have multifocal changes on CXR, despite antibiotics, negative urine strep and legionella, and negative PCT; could be component of pneumonitis as well as deconditioning.   Plan:  - agree to continue diuresis per TRH - repeat TTE  - encouraged HOB to stay > 30 at all times, even when sleeping (she is not thrilled about) - wean HFNC as tolerated for goal sat > 92% - increase duonebs to q6hrs - continue with guaifenesin, PPI, and flonase  - aggressive  pulmonary hygiene and push mobilization efforts - PT, OOB, flutter valve, adding IS and chest physiotherapy  - continue unasyn for now - can consider possible short trial of steroids if measures to not help - encourage good nutrition   Remainder per primary team.  Pulmonary will continue to follow.    Best Practice (right click and "Reselect all SmartList Selections" daily)   Diet/type: Regular consistency (see orders) DVT prophylaxis: DOAC GI prophylaxis: PPI Lines: N/A Foley:  N/A Code Status:  DNR Last date of multidisciplinary goals of care discussion [per primary team]  Labs   CBC: Recent Labs  Lab 01/19/21 1355 01/21/21 0201 01/22/21 1011 01/23/21 0106 01/24/21 0054  WBC 29.2* 20.7* 17.3* 17.5* 15.9*  NEUTROABS 27.4*  --   --   --   --   HGB 11.3* 8.8* 9.6* 8.6* 9.7*  HCT 35.4* 28.4* 29.7* 27.9* 31.2*  MCV 93.7 94.0 94.0 93.9 94.3  PLT 319 232 220 232 916    Basic Metabolic Panel: Recent Labs  Lab 01/19/21 1355 01/21/21 0201 01/22/21 1011 01/24/21 0054  NA 131* 135 138 137  K 4.4 3.9 4.2 3.9  CL 97* 107 107 104  CO2 27 22 26 26   GLUCOSE 102* 394* 98 83  BUN 19 16 15 15   CREATININE 0.95 0.89 0.79 0.71  CALCIUM 8.9 7.7* 8.5* 8.3*   GFR: Estimated Creatinine Clearance: 28.8 mL/min (by C-G formula based on SCr of 0.71 mg/dL). Recent Labs  Lab 01/19/21 1355 01/19/21 1653 01/21/21 0201 01/22/21 1011 01/23/21 0106 01/24/21 0054  PROCALCITON  --  <0.10  --   --   --   --   WBC 29.2*  --  20.7* 17.3* 17.5* 15.9*  LATICACIDVEN 1.3 1.2  --   --   --   --     Liver Function Tests: No results for input(s): AST, ALT, ALKPHOS, BILITOT, PROT, ALBUMIN in the last 168 hours. No results for input(s): LIPASE, AMYLASE in the last 168 hours. No results for input(s): AMMONIA in the last 168 hours.  ABG    Component Value Date/Time   TCO2 26 11/27/2016 1906     Coagulation Profile: No results for input(s): INR, PROTIME in the last 168 hours.  Cardiac  Enzymes: No results for input(s): CKTOTAL, CKMB, CKMBINDEX, TROPONINI in the last 168 hours.  HbA1C: Hgb A1c MFr Bld  Date/Time Value Ref Range Status  10/10/2017 02:13 PM 5.5 4.6 - 6.5 % Final    Comment:    Glycemic Control Guidelines for People with Diabetes:Non Diabetic:  <6%Goal of Therapy: <7%Additional Action Suggested:  >8%   04/11/2017 03:36 PM 5.5 4.6 - 6.5 % Final    Comment:    Glycemic Control Guidelines for People with Diabetes:Non Diabetic:  <6%Goal of Therapy: <7%Additional Action Suggested:  >8%     CBG: Recent Labs  Lab 01/20/21 2139  GLUCAP 128*    Review of Systems:   Review of Systems  Constitutional:  Positive for malaise/fatigue. Negative for chills and fever.  HENT:  Negative for sinus pain and sore throat.   Respiratory:  Positive for cough, sputum production, shortness of breath and wheezing. Negative for hemoptysis.   Cardiovascular:  Negative for leg swelling.  Gastrointestinal:  Negative for nausea and vomiting.  Neurological:  Negative for focal weakness.   Past Medical History:  She,  has a past medical history of Allergic rhinitis, Anemia, Anxiety, Chronic systolic CHF (congestive heart failure) ([place]), CLOSTRIDIUM DIFFICILE COLITIS (05/26/2009), Colitis, Coronary artery calcification, Diverticulosis of colon, DJD (degenerative joint disease), Dysphagia, Hiatal hernia, Hypercholesteremia, Hypertension, Iron deficiency, Irritable bowel syndrome, Liver lesion (02/18/2015), Lumbar back pain, Osteoporosis, Paroxysmal atrial flutter ([place]), Sinus bradycardia, Splenic lesion (02/18/2015), Squamous cell carcinoma in situ, Urinary incontinence, Venous insufficiency, and Vitamin B12 deficiency.   Surgical History:   Past Surgical History:  Procedure Laterality Date   ABDOMINAL HYSTERECTOMY     CARDIOVERSION N/A 02/03/2015   Procedure: CARDIOVERSION;  Surgeon: Sueanne Margarita, MD;  Location: [facility];  Service: Cardiovascular;  Laterality: N/A;    CARDIOVERSION N/A 03/10/2015   Procedure: CARDIOVERSION;  Surgeon: Dorothy Spark, MD;  Location: [facility];  Service: Cardiovascular;  Laterality: N/A;   CARDIOVERSION N/A 01/07/2017   Procedure: CARDIOVERSION;  Surgeon: Pixie Casino, MD;  Location[facility];  Service: Cardiovascular;  Laterality: N/A;   cataract surgery     decompressive lumbar laminectomy  06/2008   at L2-3, L3-4 and L4-5 by Dr. Ronnald Ramp   HIP PINNING,CANNULATED Left 06/30/2019   Procedure: CANNULATED LEFT HIP PINNING;  Surgeon: Mcarthur Rossetti, MD;  Location: [facility];  Service: Orthopedics;  Laterality: Left;   LAPAROSCOPIC CHOLECYSTECTOMY  04/2009   Dr. Abran Cantor   REPLACEMENT TOTAL KNEE     TEE WITHOUT CARDIOVERSION N/A 02/03/2015   Procedure: TRANSESOPHAGEAL ECHOCARDIOGRAM (TEE);  Surgeon: Sueanne Margarita, MD;  Location: [facility];  Service: Cardiovascular;  Laterality: N/A;     Social History:   reports that she has quit smoking. Her smoking use included cigarettes. She has a 5.00 pack-year smoking history. She has never used smokeless tobacco. She reports that she does not drink alcohol and does not use drugs.   Family History:  Her family history includes Cancer in her brother, brother, and daughter; Coronary artery disease in her son; Diabetes in her brother and son; Heart attack in her brother, mother, and sister; Kidney disease in her son; Stroke in her son. There is no history of Hypertension or Fainting.   Allergies Allergies  Allergen Reactions   Amoxicillin-Pot Clavulanate Diarrhea    Has patient had a PCN reaction causing immediate rash, facial/tongue/throat swelling, SOB or lightheadedness with hypotension: no Has patient had a PCN reaction causing severe rash involving mucus membranes or skin necrosis:  no Has patient had a PCN reaction that required hospitalization: no Has patient had a PCN reaction occurring within the last 10 years no If all of the above answers are "NO", then may  proceed with Cephalosporin use.    Penicillins Diarrhea   Sulfonamide Derivatives Nausea Only     Home Medications  Prior to Admission medications   Medication Sig Start Date End Date Taking? Authorizing Provider  acetaminophen (TYLENOL) 325 MG tablet Take 650 mg by mouth every 8 (eight) hours. Scheduled   Yes [provider]  Acetaminophen-DM (DELSYM CHILD COUGH+SORE THROAT) 325-10 MG/10ML LIQD Take 10 mLs by mouth every 12 (twelve) hours as needed (cough).   Yes [provider]  amiodarone (PACERONE)  200 MG tablet TAKE 1 TABLET DAILY Patient taking differently: Take 200 mg by mouth daily. 10/28/20  Yes Camnitz, Will Hassell Done, MD  apixaban (ELIQUIS) 2.5 MG TABS tablet TAKE 1 TABLET TWICE A DAY Patient taking differently: Take 2.5 mg by mouth 2 (two) times daily. 01/13/21  Yes Camnitz, Will Hassell Done, MD  atorvastatin (LIPITOR) 20 MG tablet TAKE 1 TABLET DAILY AT 6:00IN THE EVENING Patient taking differently: Take 20 mg by mouth daily at 6 PM. 10/28/20  Yes Camnitz, Will Hassell Done, MD  calcium-vitamin D (OSCAL WITH D) 500-200 MG-UNIT per tablet Take 1 tablet by mouth daily.    Yes [provider]  dextromethorphan-guaiFENesin (MUCINEX DM) 30-600 MG 12hr tablet Take 1 tablet by mouth 2 (two) times daily.   Yes [provider]  esomeprazole (NEXIUM) 40 MG capsule Take 1 capsule (40 mg total) by mouth daily at 12 noon. 07/23/19  Yes Lassen, Arlo C, PA-C  furosemide (LASIX) 20 MG tablet Take 1 tablet (20 mg total) by mouth every other day. 08/26/20  Yes Biagio Borg, MD  ibuprofen (ADVIL) 200 MG tablet Take 200 mg by mouth every 6 (six) hours as needed for fever.   Yes [provider]  isosorbide mononitrate (IMDUR) 60 MG 24 hr tablet TAKE 1 TABLET DAILY Patient taking differently: Take 60 mg by mouth daily. 10/28/20  Yes Camnitz, Ocie Doyne, MD  Nutritional Supplements (ENSURE CLEAR PO) Take 120 mLs by mouth in the morning and at bedtime. With lunch and dinner    Yes [provider]  OXYGEN Inhale 3 L into the lungs continuous.   Yes [provider]  polyethylene glycol (MIRALAX / GLYCOLAX) 17 g packet Take 17 g by mouth daily. 07/03/19  Yes [provider]  solifenacin (VESICARE) 10 MG tablet Take 1 tablet (10 mg total) by mouth daily. 07/23/19  Yes Lassen, Arlo C, PA-C  SYNTHROID 125 MCG tablet TAKE 1/2 TABLET DAILY. Patient taking differently: 62.5 mcg daily before breakfast. 04/30/20  Yes Biagio Borg, MD  vitamin B-12 (CYANOCOBALAMIN) 1000 MCG tablet Take 1 tablet (1,000 mcg total) by mouth daily. 06/17/14  Yes Rowe Clack, MD  bacitracin ointment Apply 1 application topically 2 (two) times daily. Patient not taking: Reported on 01/19/2021 10/23/20   Chase Picket, MD  Misc. Devices [facility]) MISC Use as directed three times daily 08/15/19   Biagio Borg, MD     Critical care time: n/a     Kennieth Rad, ACNP [facility] 01/24/2021, 12:02 PM  See Amion for pager If no response to pager, please call PCCM consult pager After 7:00 pm call Elink

## 2021-01-25 ENCOUNTER — Inpatient Hospital Stay (HOSPITAL_COMMUNITY): Payer: Medicare Other

## 2021-01-25 DIAGNOSIS — J9621 Acute and chronic respiratory failure with hypoxia: Secondary | ICD-10-CM | POA: Diagnosis not present

## 2021-01-25 DIAGNOSIS — Z7189 Other specified counseling: Secondary | ICD-10-CM | POA: Diagnosis not present

## 2021-01-25 DIAGNOSIS — R0902 Hypoxemia: Secondary | ICD-10-CM | POA: Diagnosis not present

## 2021-01-25 DIAGNOSIS — J690 Pneumonitis due to inhalation of food and vomit: Secondary | ICD-10-CM | POA: Diagnosis not present

## 2021-01-25 DIAGNOSIS — Z515 Encounter for palliative care: Secondary | ICD-10-CM | POA: Diagnosis not present

## 2021-01-25 DIAGNOSIS — J9601 Acute respiratory failure with hypoxia: Secondary | ICD-10-CM

## 2021-01-25 LAB — ECHOCARDIOGRAM LIMITED
Height: 55 in
S' Lateral: 2.3 cm
Weight: 1712 oz

## 2021-01-25 NOTE — Progress Notes (Signed)
Pt refused CPT at this time. RT will continue to monitor.

## 2021-01-25 NOTE — Progress Notes (Signed)
Patient placed on heated high flow due to multiple desaturation events. Patient currently tolerating well.

## 2021-01-25 NOTE — Progress Notes (Signed)
[facility]  PROGRESS NOTE  KATRIONA SCHMIERER MWU:[id number] DOB: 1927/08/26 DOA: 01/19/2021 PCP: Biagio Borg, MD   Brief HPI:   87 year old female with medical history of hypertension, dyslipidemia, heart failure, paroxysmal atrial flutter, hiatal hernia, osteoarthritis presented with cough and dyspnea.  She had recent hospitalization from 01/06/2021 to 01/12/2021 for acute hypoxemic respiratory failure due to pneumonia and rhinovirus infection.  She received antibiotic therapy and was discharged to skilled nursing facility on supplemental oxygen 3 L/min. At facility she continued to have cough and was requiring 4 L of oxygen, O2 sats was 88%.  She was brought to the ED.  Chest x-ray showed new interstitial/alveolar infiltrate right upper lobe and left lower lobe.  Patient started on IV antibiotic therapy.    Subjective   Patient seen and examined, looks lethargic today.  She is not requiring 20 L/min HFNC, FiO2 60%   Assessment/Plan:    Acute hypoxemic respiratory failure -Secondary to multifocal pneumonia, right upper lobe and left lower lobe -Swallow evaluation shows no aspiration  -Patient is currently on IV Unasyn -WBC down to 15.9 -Continue bronchodilator therapy -Chest x-ray shows persistent infiltrates on right side, also possible pulmonary edema -She received Lasix 40 mg IV x1 with no significant response -She was given Lasix 40 mg IV x1 yesterday -Pulmonology was consulted; patient continues to decline as she is not participating in therapies recommended by pulmonology -We will consult palliative care for  discussion of goals of care  Acute on chronic diastolic heart failure -BNP on admission was 220 -Repeat BNP is 273 -Patient has received IV Lasix with no significant improvement -We will avoid giving further diuresis  Paroxysmal atrial flutter -Rate controlled; continue amiodarone -Continue apixaban for anticoagulation  Hypertension -Continue  isosorbide -Blood pressure is stable  Hypothyroidism -Continue Synthroid  Dyslipidemia -Continue atorvastatin  Hypovolemic hyponatremia -Resolved  Anemia -Hemoglobin dropped from 11.3 on 11/21 to 9.7 today -No overt signs of bleeding -FOBT ordered -Follow CBC in a.m.  Goals of care -On 11/26 called and discussed with daughter that patient's oxygen requirement has gone up consistently since hospitalization despite treatment with IV antibiotics.  Patient is DNR/DNI.  If patient does not do well over the next few days then consider comfort measures. -We will obtain palliative care consult today.  Medications     amiodarone  200 mg Oral Daily   apixaban  2.5 mg Oral BID   atorvastatin  20 mg Oral q1800   darifenacin  7.5 mg Oral Daily   fluticasone  2 spray Each Nare BID   guaiFENesin  600 mg Oral BID   ipratropium-albuterol  3 mL Nebulization Q6H   isosorbide mononitrate  60 mg Oral Daily   levothyroxine  62.5 mcg Oral Q0600   pantoprazole  40 mg Oral Daily   polyethylene glycol  17 g Oral Daily   saccharomyces boulardii  250 mg Oral BID     Data Reviewed:   CBG:  Recent Labs  Lab 01/20/21 2139  GLUCAP 128*    SpO2: 95 % O2 Flow Rate (L/min): 20 L/min FiO2 (%): 60 %    Vitals:   01/25/21 1429 01/25/21 1431 01/25/21 1543 01/25/21 1551  BP:   (!) 95/49 95/60  Pulse:  77 73 88  Resp:  (!) 22 (!) 22 20  Temp:   98.3 F (36.8 C)   TempSrc:   Axillary   SpO2: 95% 95% 96% 95%  Weight:      Height:  Intake/Output Summary (Last 24 hours) at 01/25/2021 1600 Last data filed at 01/25/2021 1500 Gross per 24 hour  Intake 629.58 ml  Output 250 ml  Net 379.58 ml    11/25 1901 - 11/27 0700 In: 483.6 [P.O.:240] Out: 350 [Urine:350]  Filed Weights   01/19/21 1333  Weight: 48.5 kg    Data Reviewed: Basic Metabolic Panel: Recent Labs  Lab 01/19/21 1355 01/21/21 0201 01/22/21 1011 01/24/21 0054  NA 131* 135 138 137  K 4.4 3.9 4.2 3.9  CL  97* 107 107 104  CO2 27 22 26 26   GLUCOSE 102* 394* 98 83  BUN 19 16 15 15   CREATININE 0.95 0.89 0.79 0.71  CALCIUM 8.9 7.7* 8.5* 8.3*   Liver Function Tests: No results for input(s): AST, ALT, ALKPHOS, BILITOT, PROT, ALBUMIN in the last 168 hours. No results for input(s): LIPASE, AMYLASE in the last 168 hours. No results for input(s): AMMONIA in the last 168 hours. CBC: Recent Labs  Lab 01/19/21 1355 01/21/21 0201 01/22/21 1011 01/23/21 0106 01/24/21 0054  WBC 29.2* 20.7* 17.3* 17.5* 15.9*  NEUTROABS 27.4*  --   --   --   --   HGB 11.3* 8.8* 9.6* 8.6* 9.7*  HCT 35.4* 28.4* 29.7* 27.9* 31.2*  MCV 93.7 94.0 94.0 93.9 94.3  PLT 319 232 220 232 241   Cardiac Enzymes: No results for input(s): CKTOTAL, CKMB, CKMBINDEX, TROPONINI in the last 168 hours. BNP (last 3 results) Recent Labs    01/19/21 1355 01/19/21 1653 01/24/21 0956  BNP 223.7* 220.0* 273.6*    ProBNP (last 3 results) No results for input(s): PROBNP in the last 8760 hours.  CBG: Recent Labs  Lab 01/20/21 2139  GLUCAP 128*       Radiology Reports  No results found.     Antibiotics: Anti-infectives (From admission, onward)    Start     Dose/Rate Route Frequency Ordered Stop   01/20/21 1800  ampicillin-sulbactam (UNASYN) 1.5 g in sodium chloride 0.9 % 100 mL IVPB        1.5 g 200 mL/hr over 30 Minutes Intravenous Every 12 hours 01/20/21 1417 01/27/21 1759   01/20/21 1500  cefTRIAXone (ROCEPHIN) 2 g in sodium chloride 0.9 % 100 mL IVPB  Status:  Discontinued        2 g 200 mL/hr over 30 Minutes Intravenous Every 24 hours 01/19/21 1621 01/20/21 1414   01/19/21 2200  doxycycline (VIBRA-TABS) tablet 100 mg  Status:  Discontinued        100 mg Oral Every 12 hours 01/19/21 1621 01/20/21 1414   01/19/21 1445  cefTRIAXone (ROCEPHIN) 1 g in sodium chloride 0.9 % 100 mL IVPB        1 g 200 mL/hr over 30 Minutes Intravenous  Once 01/19/21 1443 01/19/21 1539   01/19/21 1445  doxycycline (VIBRA-TABS)  tablet 100 mg        100 mg Oral  Once 01/19/21 1443 01/19/21 1508         DVT prophylaxis: Apixaban  Code Status: DNR  Family Communication: Discussed with daughter on phone   Consultants: None  Procedures:     Objective    Physical Examination:  General-appears in no acute distress Heart-S1-S2, regular, no murmur auscultated Lungs-decreased breath sounds bilaterally Abdomen-soft, nontender, no organomegaly Extremities-no edema in the lower extremities Neuro-alert, oriented x3, no focal deficit noted  Status is: Inpatient  Dispo: The patient is from: Skilled nursing facility  Anticipated d/c is to: Skilled nursing facility              Anticipated d/c date is: 01/26/2021              Patient currently not stable for discharge  Barrier to discharge-ongoing treatment for acute hypoxemic respiratory failure  COVID-19 Labs  No results for input(s): DDIMER, FERRITIN, LDH, CRP in the last 72 hours.  Lab Results  Component Value Date   SARSCOV2NAA NEGATIVE 01/19/2021   [place] NEGATIVE 01/12/2021   [place] NEGATIVE 01/09/2021   [place] NEGATIVE 01/06/2021            Recent Results (from the past 240 hour(s))  Culture, blood (routine x 2)     Status: None   Collection Time: 01/19/21  1:55 PM   Specimen: BLOOD RIGHT FOREARM  Result Value Ref Range Status   Specimen Description BLOOD RIGHT FOREARM  Final   Special Requests   Final    BOTTLES DRAWN AEROBIC AND ANAEROBIC Blood Culture adequate volume   Culture   Final    NO GROWTH 5 DAYS Performed at [facility], [address] [place], [place] [address]    Report Status 01/24/2021 FINAL  Final  Resp Panel by RT-PCR (Flu A&B, Covid)     Status: None   Collection Time: 01/19/21  2:00 PM   Specimen: Nasopharyngeal(NP) swabs in vial transport medium  Result Value Ref Range Status   SARS Coronavirus 2 by RT PCR NEGATIVE NEGATIVE Final    Comment: (NOTE) SARS-CoV-2  target nucleic acids are NOT DETECTED.  The SARS-CoV-2 RNA is generally detectable in upper respiratory specimens during the acute phase of infection. The lowest concentration of SARS-CoV-2 viral copies this assay can detect is 138 copies/mL. A negative result does not preclude SARS-Cov-2 infection and should not be used as the sole basis for treatment or other patient management decisions. A negative result may occur with  improper specimen collection/handling, submission of specimen other than nasopharyngeal swab, presence of viral mutation(s) within the areas targeted by this assay, and inadequate number of viral copies(<138 copies/mL). A negative result must be combined with clinical observations, patient history, and epidemiological information. The expected result is Negative.  Fact Sheet for Patients:  EntrepreneurPulse.com.au  Fact Sheet for Healthcare Providers:  IncredibleEmployment.be  This test is no t yet approved or cleared by the [place] FDA and  has been authorized for detection and/or diagnosis of SARS-CoV-2 by FDA under an Emergency Use Authorization (EUA). This EUA will remain  in effect (meaning this test can be used) for the duration of the COVID-19 declaration under Section 564(b)(1) of the Act, 21 U.S.C.section 360bbb-3(b)(1), unless the authorization is terminated  or revoked sooner.       Influenza A by PCR NEGATIVE NEGATIVE Final   Influenza B by PCR NEGATIVE NEGATIVE Final    Comment: (NOTE) The Xpert Xpress SARS-CoV-2/FLU/RSV plus assay is intended as an aid in the diagnosis of influenza from Nasopharyngeal swab specimens and should not be used as a sole basis for treatment. Nasal washings and aspirates are unacceptable for Xpert Xpress SARS-CoV-2/FLU/RSV testing.  Fact Sheet for Patients: EntrepreneurPulse.com.au  Fact Sheet for Healthcare  Providers: IncredibleEmployment.be  This test is not yet approved or cleared by the [place] FDA and has been authorized for detection and/or diagnosis of SARS-CoV-2 by FDA under an Emergency Use Authorization (EUA). This EUA will remain in effect (meaning this test can be used) for the duration of the  COVID-19 declaration under Section 564(b)(1) of the Act, 21 U.S.C. section 360bbb-3(b)(1), unless the authorization is terminated or revoked.  Performed at [facility], [place] [address] [place], [place] [address]   Culture, blood (routine x 2)     Status: None   Collection Time: 01/19/21  3:07 PM   Specimen: BLOOD  Result Value Ref Range Status   Specimen Description BLOOD LEFT ANTECUBITAL  Final   Special Requests   Final    BOTTLES DRAWN AEROBIC AND ANAEROBIC Blood Culture adequate volume   Culture   Final    NO GROWTH 5 DAYS Performed at [facility], [place] [address] [place], [place] [address]    Report Status 01/24/2021 FINAL  Final  MRSA Next Gen by PCR, Nasal     Status: None   Collection Time: 01/20/21  3:51 PM   Specimen: Nasal Mucosa; Nasal Swab  Result Value Ref Range Status   MRSA by PCR Next Gen NOT DETECTED NOT DETECTED Final    Comment: (NOTE) The GeneXpert MRSA Assay (FDA approved for NASAL specimens only), is one component of a comprehensive MRSA colonization surveillance program. It is not intended to diagnose MRSA infection nor to guide or monitor treatment for MRSA infections. Test performance is not FDA approved in patients less than 58 years old. Performed at [facility], [place] [address] [place], [place] [address]     [facility] If 7PM-7AM, please contact night-coverage at [email or web address], Office  [phone]   01/25/2021, 4:00 PM  LOS: 6 days

## 2021-01-25 NOTE — Consult Note (Signed)
Palliative Care Consult Note                                  Date: 01/25/2021   Patient Name: Cathy [name]  DOB: June 07, 1927  MRN: [id number]  Age / Sex: 87 y.o., female  PCP: Biagio Borg, MD Referring Physician: Oswald Hillock, MD  Reason for Consultation: Establishing goals of care  HPI/Patient Profile: 87 y.o. female  with past medical history of hypertension, dyslipidemia, heart failure, paroxysmal atrial flutter, hiatal hernia, osteoarthritis presented with cough and dyspnea.  She had recent hospitalization from 01/06/2021 to 01/12/2021 for acute hypoxemic respiratory failure due to pneumonia and rhinovirus infection.  She received antibiotic therapy and was discharged to skilled nursing facility on supplemental oxygen 3 L/min. At facility she continued to have cough and was requiring 4 L of oxygen, O2 sats was 88%.  She was brought to the ED.  Chest x-ray showed new interstitial/alveolar infiltrate right upper lobe and left lower lobe. She was admitted on 01/19/2021 with acute hypoxemic respiratory failure, diastolic heart failure, paroxysmal A. fib, and others.   During her hospitalization she developed increasing O2 requirements up to 15 L despite diuresis.  Swallow eval 11/25 with no aspiration.  No history of lung issues.  Pulmonary was consulted for further recommendations.  Recommended out of bed greater than 30 degrees at all times as due to reflux, DuoNebs, pulmonary hygiene, encourage mobilization and chest physiotherapy.  Overall poor prognosis.  PMT was consulted for goals of care conversation.  Hospitalist spoke with daughter about consistent increasing oxygen requirements over the past several days despite antibiotics.  Currently a DNR and recommended if no improvement over the next few days, they would recommend comfort measures.  Past Medical History:  Diagnosis Date  . Allergic rhinitis   . Anemia   . Anxiety   . Chronic  systolic CHF (congestive heart failure) ([place])    a. 2D Echo 02/02/15: EF 25-30%, akinesis of mid-apical anteroseptal myocardium, grade 3 DD, mod MR, severely dilated LA, mildly reduced RV systoluc function, mod dilated RA, mod TR, mildly increased PASP 57mHg. b. 10/2016: EF 55-60% with no regional WMA. Grade 3 DD.   .Marland KitchenCLOSTRIDIUM DIFFICILE COLITIS 05/26/2009   Qualifier: Diagnosis of  By: KNils PyleCMA ([place], LMearl Latin   . Colitis    lymphocytic colitis feb 2011  . Coronary artery calcification    a. By CT 01/2015.  . Diverticulosis of colon   . DJD (degenerative joint disease)    gets epidural injections  . Dysphagia   . Hiatal hernia   . Hypercholesteremia   . Hypertension   . Iron deficiency   . Irritable bowel syndrome   . Liver lesion 02/18/2015  . Lumbar back pain   . Osteoporosis   . Paroxysmal atrial flutter ([place]    a. Dx 01/2015 - underwent DCCV 03/2015; takes Eliquis  . Sinus bradycardia   . Splenic lesion 02/18/2015  . Squamous cell carcinoma in situ   . Urinary incontinence   . Venous insufficiency   . Vitamin B12 deficiency     Subjective:   This NP EWalden Fieldreviewed medical records, received report from team, assessed the patient and then meet at the patient's bedside to discuss diagnosis, prognosis, GOC, EOL wishes disposition and options.  I met with the patient at the bedside, although she was lethargic and not very interactive.  I called and  spoke with her daughter on the phone later in the afternoon..   Concept of Palliative Care was introduced as specialized medical care for people and their families living with serious illness.  If focuses on providing relief from the symptoms and stress of a serious illness.  The goal is to improve quality of life for both the patient and the family. Values and goals of care important to patient and family were attempted to be elicited.  Created space and opportunity for patient  and family to explore thoughts and feelings  regarding current medical situation   Natural trajectory and current clinical status were discussed. Questions and concerns addressed. Patient  encouraged to call with questions or concerns.    Patient/Family Understanding of Illness: Her daughter understands that she recently had double pneumonia and RSV.  She knows that she is having struggling breathing with increasing oxygen demands.  She asked about current oxygen status and informed her that she is now up to 20 L/min on HFNC.  Her daughter understands that pulmonary has been called and they recommended an incentive spirometer (which the daughters familiar with) and are currently doing antibiotics.  She knows they are doing everything they can for her.    Life Review: The patient worked at [employer] where she retired.  She enjoys cleaning and watching "stories" (oh poppers) on [employer] as well as Hall[employer] movies and [place]  She has 2 children (daughter Stanton Kidney and a son).  However, her daughter states that her brother has vascular dementia and while she discusses everything with him he is not able to really participate in decision-making.  Patient Values: Family, quality of life  Goals: To continue full scope of treatment and get better if she can.  However, her daughter is realistic that she is very sick and we will need to take it a day at a time.  Today's Discussion: I discussed the patient's current medical condition.  I shared my concerns about her increasing oxygen needs (now up to 20 L/min HFNC), her increasing apparent weakness and lethargy today.  Her respiratory rate is a bit elevated at 20-22, although she is satting well at 94 to 96%.  We reviewed that the patient declined intervention earlier in the day and she states that she would talk with her mom about this.  She said if she tries to refuse tell her "her daughter said you have to do this."  Her daughter has previously made the patient a DNR, excepting that she is quite ill.  We  discussed the premise of "hope and pray for the best, but plan for if things do not go the way we want."  She shares that disbelief.  She will encourage her mother to do everything she can to try to get better and get home.  However, if things do not work out that way and she is amenable to further discussions on goals of care.  I provided emotional and general support through therapeutic listening, reminiscing, sharing of stories, and other techniques.  I answered all their questions and addressed all concerns.  Review of Systems  Respiratory:  Positive for cough and shortness of breath (mild).   Gastrointestinal:  Negative for abdominal pain, nausea and vomiting.  Neurological:  Positive for weakness.   Objective:   Primary Diagnoses: Present on Admission: . Acute on chronic respiratory failure with hypoxemia ([place]) . Prolonged Q-T interval on ECG . Hypothyroidism . Normocytic anemia . Atrial fibrillation and flutter ([place]) . Heart failure with  preserved ejection fraction ([place]) . Aspiration pneumonia [facility])   Physical Exam Vitals (Appears weak) and nursing note reviewed.  Constitutional:      General: She is sleeping. She is not in acute distress.    Appearance: She is ill-appearing.  HENT:     Head: Normocephalic and atraumatic.  Cardiovascular:     Rate and Rhythm: Normal rate.  Pulmonary:     Effort: Pulmonary effort is normal. No respiratory distress.     Breath sounds: Rhonchi present.  Abdominal:     General: Abdomen is flat.     Palpations: Abdomen is soft.  Skin:    General: Skin is warm and dry.  Neurological:     Mental Status: She is easily aroused. She is lethargic.  Psychiatric:        Mood and Affect: Mood normal.        Behavior: Behavior normal.    Vital Signs:  BP (!) 109/53 (BP Location: Left Arm)   Pulse 74   Temp 97.9 F (36.6 C) (Axillary)   Resp (!) 21   Ht 4' 7"  (1.397 m)   Wt 48.5 kg   SpO2 97%   BMI 24.87 kg/m   Palliative  Assessment/Data: 30-40%    Advanced Care Planning:   Primary Decision Maker: NEXT OF KIN  Code Status/Advance Care Planning: DNR  A discussion was had today regarding advanced directives. Concepts specific to code status, artifical feeding and hydration, continued IV antibiotics and rehospitalization was had.  The difference between a aggressive medical intervention path and a palliative comfort care path for this patient at this time was had.   Decisions/Changes to ACP: None today  Assessment & Plan:   Impression: 87 year old female with chronic comorbidities as described above and recent hospitalization for double pneumonia and rhinovirus.  She was transferred to a skilled nursing facility where she had some desaturation episodes and was admitted for respiratory failure with hypoxemia, acute on chronic diastolic congestive heart failure, A. fib.  She has had increasing oxygen requirements from 10 L to 15 L to 20 L, high flow nasal cannula.  The patient was made a DNR over the weekend and recommended follow for 24 to 48 hours and if no improvement may consider comfort care.  Unfortunately, today she does appear quite frail and lethargic.  I had a frank discussion with the patient's daughter who understands the severity of her illness.  We agreed to take it a day at a time for any signs of improvement.  Overall goal is to get better and get home, but she is realistic that this may not be possible.  SUMMARY OF RECOMMENDATIONS   Remain DNR Full scope of care otherwise Monitor for improvement versus decline Further decisions based on patient's clinical status evolution PMT will continue to follow  Symptom Management:  Per primary team PMT is available to assist as needed  Prognosis:  Unable to determine  Discharge Planning:  To Be Determined   Discussed with: Medical team, nursing team, patient's family    Thank you for allowing [place] to participate in the care of Cathy [name] PMT will continue to support holistically.  Time Total: 70 min  Greater than 50%  of this time was spent counseling and coordinating care related to the above assessment and plan.  Signed by: Walden Field, NP Palliative Medicine Team  Team Phone # [phone] (Nights/Weekends)  01/25/2021, 8:04 AM

## 2021-01-25 NOTE — Progress Notes (Signed)
NAME:  Cathy [name], MRN:  [id number], DOB:  October 08, 1927, LOS: 6 ADMISSION DATE:  01/19/2021, CONSULTATION DATE:  01/24/2021 REFERRING MD:  Dr. Darrick Meigs, CHIEF COMPLAINT:  Hypoxia/ PNA   History of Present Illness:  87 year old female with prior history as below significant for diastolic HF, HTN, PAF on Eliquis, and large hiatal hernia who underwent recent hospitalization 11/8 to 11/14 for acute hypoxic respiratory failure secondary to pneumonia and rhinovirus discharged to SNF on 3L [place].   She presented back on 11/21 with continued cough with white sputum and dyspnea, found to be hypoxic at 88% on 4L with CXR showing new interstitial/ alveolar infiltrates in right upper and left lower lobe.  She has been afebrile with leukocytosis of 29k on admit.  She was started on ceftriaxone and doxycyline and admitted to [facility], since transitioned to Unasyn.  WBC is now down to 15.9 and remains afebrile, but has worsening O2 requirements now on 15L.  She was given lasix 40mg  on 11/24 without any improvement for BNP 220 but not felt to be hypervolemic clinically.  Repeat BNP today 273 and additional dose of lasix ordered.  Procalcitonin has remained normal.  Underwent swallow evaluation 11/25 which showed no aspiration.   Family denies any prior lung issues or history.  Reports she quit smoking over 40 years ago.  Denies any ongoing sinus issues.  She is DNR/ DNI.  Pulmonary consulted for further recommendations.   Pertinent  Medical History  HFpEF, HTN, HLD, PAF, hiatal hernia, OA  Significant Hospital Events: Including procedures, antibiotic start and stop dates in addition to other pertinent events   11/8 > 11/14 hospitalized with PNA/ rhinovirus, d/c to SNF on 3L, 6 days of azithro/ ceftriaxone 11/21 admitted to [facility] hypoxic resp failure/ PNA, started on doxy/ ceftriaxone 11/22 changed to unasyn, last dose of doxy 11/25 neg SLP  11/8 urine strep/ legionella neg 11/8 RVP > +rhino/ enterovirus   11/21 SARS/ flu  > neg 11/22 MRSA PCR > neg 11/21 Bcx 2 > neg 11/22 urine strep > neg  11/21 ceftriaxone 11/21 doxcy > 11/22 11/22 unasyn >  Interim History / Subjective:  Transitioned to [facility] overnight for multiple desaturations on HFNC 15L, currently on 80%/20L.  Will not wear CPAP Had enema overnight with good output but patient exhausted and slept very little, asking to rest this morning  Objective   Blood pressure (!) 109/53, pulse 69, temperature 97.9 F (36.6 C), temperature source Axillary, resp. rate (!) 22, height 4\' 7"  (1.397 m), weight 48.5 kg, SpO2 97 %.    FiO2 (%):  [70 %-80 %] 70 %   Intake/Output Summary (Last 24 hours) at 01/25/2021 0838 Last data filed at 01/25/2021 0800 Gross per 24 hour  Intake 209.58 ml  Output --  Net 209.58 ml    Filed Weights   01/19/21 1333  Weight: 48.5 kg   Examination: General:  Frail elderly female resting in bed in NAD Neuro:  Will awaken to voice and follow simple commands, appears exhausted, MAE CV: afib, rate controlled PULM:  non labored lying in bed, becomes more tachypneic when awake, clear anteriorly, diminished in bases, fine crackles in bases, currently on HHFNC, decreased to 70%/ 20L remains 96% GI: soft, bs+, purwick  Extremities: warm/dry, no LE edema   Has purwick which leaked overnight,  5 unmeasured occurrences, otherwise 267ml/ 24hrs   Resolved Hospital Problem list    Assessment & Plan:   Acute on chronic hypoxic respiratory failure with progressive  interstitial infiltrates  Acute on chronic diastolic HF - suspected to be multifactorial 2/2 pulmonary edema, massive hiatal hernia/ patulous esophagus, likely silent aspiration/ nocturnal reflux, recent viral infection/ pneumonia.   - continue supplemental O2 for goal > 88%, currently on HHFNC.  She does not tolerate/ want CPAP.  Expect desaturations with exertion.  - s/p lasix 11/26, unmeasured urinary occurrences.  Not hypervolemic on exam - refused CPT thus far - TTE  pending - continue duonebs q 6hr - day 6x/ unasyn  - continue PPI, flonase, and guaifenesin  - still encouraging HOB> 30 at all times, aspiration precautions, aggressive pulm hygiene_> OOB/ PT.   However, highly concerned that patient does not want these things and she will not improve without these.  She is very debilitated given events over the last month.  She continues to decline despite appropriate therapies.  Palliative care has been consulted.  Agree with palliative care consult and should consider transitioning to comfort measures.  She remains a DNR/ DNI.    Remainder per primary team.  Pulmonary will sign off for now and be available as needed.     Labs   CBC: Recent Labs  Lab 01/19/21 1355 01/21/21 0201 01/22/21 1011 01/23/21 0106 01/24/21 0054  WBC 29.2* 20.7* 17.3* 17.5* 15.9*  NEUTROABS 27.4*  --   --   --   --   HGB 11.3* 8.8* 9.6* 8.6* 9.7*  HCT 35.4* 28.4* 29.7* 27.9* 31.2*  MCV 93.7 94.0 94.0 93.9 94.3  PLT 319 232 220 232 241     Basic Metabolic Panel: Recent Labs  Lab 01/19/21 1355 01/21/21 0201 01/22/21 1011 01/24/21 0054  NA 131* 135 138 137  K 4.4 3.9 4.2 3.9  CL 97* 107 107 104  CO2 27 22 26 26   GLUCOSE 102* 394* 98 83  BUN 19 16 15 15   CREATININE 0.95 0.89 0.79 0.71  CALCIUM 8.9 7.7* 8.5* 8.3*    GFR: Estimated Creatinine Clearance: 28.8 mL/min (by C-G formula based on SCr of 0.71 mg/dL). Recent Labs  Lab 01/19/21 1355 01/19/21 1653 01/21/21 0201 01/22/21 1011 01/23/21 0106 01/24/21 0054  PROCALCITON  --  <0.10  --   --   --   --   WBC 29.2*  --  20.7* 17.3* 17.5* 15.9*  LATICACIDVEN 1.3 1.2  --   --   --   --      Liver Function Tests: No results for input(s): AST, ALT, ALKPHOS, BILITOT, PROT, ALBUMIN in the last 168 hours. No results for input(s): LIPASE, AMYLASE in the last 168 hours. No results for input(s): AMMONIA in the last 168 hours.  ABG    Component Value Date/Time   TCO2 26 11/27/2016 1906      Coagulation  Profile: No results for input(s): INR, PROTIME in the last 168 hours.  Cardiac Enzymes: No results for input(s): CKTOTAL, CKMB, CKMBINDEX, TROPONINI in the last 168 hours.  HbA1C: Hgb A1c MFr Bld  Date/Time Value Ref Range Status  10/10/2017 02:13 PM 5.5 4.6 - 6.5 % Final    Comment:    Glycemic Control Guidelines for People with Diabetes:Non Diabetic:  <6%Goal of Therapy: <7%Additional Action Suggested:  >8%   04/11/2017 03:36 PM 5.5 4.6 - 6.5 % Final    Comment:    Glycemic Control Guidelines for People with Diabetes:Non Diabetic:  <6%Goal of Therapy: <7%Additional Action Suggested:  >8%     CBG: Recent Labs  Lab 01/20/21 2139  GLUCAP 128*  Kennieth Rad, ACNP [facility] 01/25/2021, 8:38 AM  See Amion for pager If no response to pager, please call PCCM consult pager After 7:00 pm call Elink

## 2021-01-25 NOTE — Progress Notes (Signed)
  Echocardiogram 2D Echocardiogram has been performed.  Cathy [name] 01/25/2021, 4:43 PM

## 2021-01-26 ENCOUNTER — Inpatient Hospital Stay (HOSPITAL_COMMUNITY): Payer: Medicare Other

## 2021-01-26 DIAGNOSIS — R0602 Shortness of breath: Secondary | ICD-10-CM | POA: Diagnosis not present

## 2021-01-26 DIAGNOSIS — R609 Edema, unspecified: Secondary | ICD-10-CM

## 2021-01-26 DIAGNOSIS — E039 Hypothyroidism, unspecified: Secondary | ICD-10-CM | POA: Diagnosis not present

## 2021-01-26 DIAGNOSIS — Z515 Encounter for palliative care: Secondary | ICD-10-CM | POA: Diagnosis not present

## 2021-01-26 DIAGNOSIS — J189 Pneumonia, unspecified organism: Secondary | ICD-10-CM | POA: Diagnosis not present

## 2021-01-26 DIAGNOSIS — J9621 Acute and chronic respiratory failure with hypoxia: Secondary | ICD-10-CM | POA: Diagnosis not present

## 2021-01-26 DIAGNOSIS — R0902 Hypoxemia: Secondary | ICD-10-CM | POA: Diagnosis not present

## 2021-01-26 DIAGNOSIS — Z7189 Other specified counseling: Secondary | ICD-10-CM | POA: Diagnosis not present

## 2021-01-26 LAB — BASIC METABOLIC PANEL
Anion gap: 4 — ABNORMAL LOW (ref 5–15)
BUN: 16 mg/dL (ref 8–23)
CO2: 28 mmol/L (ref 22–32)
Calcium: 8.3 mg/dL — ABNORMAL LOW (ref 8.9–10.3)
Chloride: 107 mmol/L (ref 98–111)
Creatinine, Ser: 0.92 mg/dL (ref 0.44–1.00)
GFR, Estimated: 59 mL/min — ABNORMAL LOW (ref >60)
Glucose, Bld: 89 mg/dL (ref 70–99)
Potassium: 3.8 mmol/L (ref 3.5–5.1)
Sodium: 139 mmol/L (ref 135–145)

## 2021-01-26 LAB — CBC
HCT: 27.7 % — ABNORMAL LOW (ref 36.0–46.0)
Hemoglobin: 8.6 g/dL — ABNORMAL LOW (ref 12.0–15.0)
MCH: 29.5 pg (ref 26.0–34.0)
MCHC: 31 g/dL (ref 30.0–36.0)
MCV: 94.9 fL (ref 80.0–100.0)
Platelets: 254 10*3/uL (ref 150–400)
RBC: 2.92 MIL/uL — ABNORMAL LOW (ref 3.87–5.11)
RDW: 15.3 % (ref 11.5–15.5)
WBC: 13.6 10*3/uL — ABNORMAL HIGH (ref 4.0–10.5)
nRBC: 0 % (ref 0.0–0.2)

## 2021-01-26 NOTE — Progress Notes (Signed)
Pt refused CPT. RT will continue to monitor and will try again at a later time.

## 2021-01-26 NOTE — Progress Notes (Signed)
Daily Progress Note   Patient Name: Cathy [name]       Date: 01/26/2021 DOB: 01-21-1928  Age: 87 y.o. MRN#: [id number] Attending Physician: Oswald Hillock, MD Primary Care Physician: Biagio Borg, MD Admit Date: 01/19/2021 Length of Stay: 7 days  Reason for Consultation/Follow-up: Establishing goals of care  HPI/Patient Profile:  87 y.o. female  with past medical history of hypertension, dyslipidemia, heart failure, paroxysmal atrial flutter, hiatal hernia, osteoarthritis presented with cough and dyspnea.  She had recent hospitalization from 01/06/2021 to 01/12/2021 for acute hypoxemic respiratory failure due to pneumonia and rhinovirus infection.  She received antibiotic therapy and was discharged to skilled nursing facility on supplemental oxygen 3 L/min. At facility she continued to have cough and was requiring 4 L of oxygen, O2 sats was 88%.  She was brought to the ED.  Chest x-ray showed new interstitial/alveolar infiltrate right upper lobe and left lower lobe. She was admitted on 01/19/2021 with acute hypoxemic respiratory failure, diastolic heart failure, paroxysmal A. fib, and others.    During her hospitalization she developed increasing O2 requirements up to 15 L despite diuresis.  Swallow eval 11/25 with no aspiration.  No history of lung issues.  Pulmonary was consulted for further recommendations.  Recommended out of bed greater than 30 degrees at all times as due to reflux, DuoNebs, pulmonary hygiene, encourage mobilization and chest physiotherapy.  Overall poor prognosis.   PMT was consulted for goals of care conversation.  Hospitalist spoke with daughter about consistent increasing oxygen requirements over the past several days despite antibiotics.  Currently a DNR and recommended if no improvement over the next few days, they would recommend comfort measures.  Subjective:   Subjective: Chart Reviewed. Updates received. Patient Assessed. Created space and opportunity for patient   and family to explore thoughts and feelings regarding current medical situation.  Today's Discussion: I met with the patient, the patient's daughter, and the patient's granddaughter at the bedside.  Family seems very supportive to her improvement.  Overall the patient is much more interactive, awake, appears less frail/lethargic.  We discussed her refusal of respiratory care yesterday and she does not remember this.  Her daughter encouraged her to accept treatments to get better.  She states she will do the things that they are asking of her.  Her daughter and granddaughter encouraged her to eat and her granddaughter assisted with feeding her.  We reviewed labs from today showing improvement in white blood cell count.  However, she remains on 20 L/min via high flow nasal cannula.  I explained to the daughter that even if the infection is treated and gets better there could be a lot of lung damage, inflammation, worsening of her COPD that could result in continued high oxygen needs.  We again emphasized the need to take this a day at a time and they can adjust her oxygen based on her ability to tolerate lower amounts.  She verbalized understanding.  I explained the need for further discussions as her mom's clinical status evolves.  She agrees with this.  Review of Systems  Constitutional:  Positive for fatigue.  Respiratory:  Positive for cough and shortness of breath ("not bad").   Gastrointestinal:  Negative for abdominal pain, nausea and vomiting.   Objective:   Vital Signs:  BP (!) 100/50 (BP Location: Left Arm)   Pulse (!) 58   Temp 97.6 F (36.4 C) (Oral)   Resp 18   Ht 4' 7"  (1.397 m)   Wt  48.5 kg   SpO2 93%   BMI 24.87 kg/m   Physical Exam: Physical Exam Vitals and nursing note reviewed.  Constitutional:      General: She is not in acute distress.    Appearance: She is normal weight. She is ill-appearing.  HENT:     Head: Normocephalic and atraumatic.  Cardiovascular:      Rate and Rhythm: Normal rate.  Pulmonary:     Effort: Pulmonary effort is normal. No respiratory distress.     Breath sounds: Rhonchi present. No wheezing.  Abdominal:     General: Abdomen is flat.     Palpations: Abdomen is soft.  Skin:    General: Skin is warm and dry.  Neurological:     Mental Status: She is alert.     Comments: Somewhat pleasantly confused  Psychiatric:        Mood and Affect: Mood normal.        Behavior: Behavior normal.    Palliative Assessment/Data: 30-40%   Assessment & Plan:   Impression: Present on Admission: . Acute on chronic respiratory failure with hypoxemia ([place]) . Prolonged Q-T interval on ECG . Hypothyroidism . Normocytic anemia . Atrial fibrillation and flutter ([place]) . Heart failure with preserved ejection fraction ([place]) . Aspiration pneumonia [facility])  87 year old female with chronic comorbidities as described above and recent hospitalization for double pneumonia and rhinovirus.  She was transferred to a skilled nursing facility where she had some desaturation episodes and was admitted for respiratory failure with hypoxemia, acute on chronic diastolic congestive heart failure, A. fib.  She has had increasing oxygen requirements from 10 L to 15 L to 20 L, high flow nasal cannula.  The patient was made a DNR over the weekend and recommended follow for 24 to 48 hours and if no improvement may consider comfort care.  Unfortunately, today she does appear quite frail and lethargic.  I had a frank discussion with the patient's daughter who understands the severity of her illness.  We agreed to take it a day at a time for any signs of improvement.  Overall goal is to get better and get home, but she is realistic that this may not be possible.  SUMMARY OF RECOMMENDATIONS   Remain DNR Continue current course of treatment Continue significant pulmonary interventions for improvement Take it a day at a time Further discussions as needed pending clinical  evolution PMT will continue to follow  Code Status: DNR  Prognosis: Unable to determine  Discharge Planning: To Be Determined  Discussed with: Medical team, nursing team, patient, patient's family  Thank you for allowing [place] to participate in the care of CLEMMIE BUELNA PMT will continue to support holistically.  Time Total: 35 min  Visit consisted of counseling and education dealing with the complex and emotionally intense issues of symptom management and palliative care in the setting of serious and potentially life-threatening illness. Greater than 50%  of this time was spent counseling and coordinating care related to the above assessment and plan.  Walden Field, NP Palliative Medicine Team  Team Phone # [phone] (Nights/Weekends)  10/28/2020, 8:17 AM

## 2021-01-26 NOTE — Progress Notes (Signed)
VASCULAR LAB    Left upper extremity venous duplex has been performed.  See CV proc for preliminary results.   Daemyn Gariepy, RVT 01/26/2021, 11:03 AM

## 2021-01-26 NOTE — Progress Notes (Signed)
[facility]  PROGRESS NOTE  Cathy [name] YKZ:[id number] DOB: 1927-12-01 DOA: 01/19/2021 PCP: Biagio Borg, MD   Brief HPI:   87 year old female with medical history of hypertension, dyslipidemia, heart failure, paroxysmal atrial flutter, hiatal hernia, osteoarthritis presented with cough and dyspnea.  She had recent hospitalization from 01/06/2021 to 01/12/2021 for acute hypoxemic respiratory failure due to pneumonia and rhinovirus infection.  She received antibiotic therapy and was discharged to skilled nursing facility on supplemental oxygen 3 L/min. At facility she continued to have cough and was requiring 4 L of oxygen, O2 sats was 88%.  She was brought to the ED.  Chest x-ray showed new interstitial/alveolar infiltrate right upper lobe and left lower lobe.  Patient started on IV antibiotic therapy.    Subjective   Patient seen and examined, she has very poor p.o. intake.  Oxygen requirement has gone up to 20 L/min HFNC.   Assessment/Plan:    Acute hypoxemic respiratory failure -Secondary to multifocal pneumonia, right upper lobe and left lower lobe -Swallow evaluation shows no aspiration  -Patient is currently on IV Unasyn -WBC down to 15.9 -Continue bronchodilator therapy -Chest x-ray shows persistent infiltrates on right side, also possible pulmonary edema -She received Lasix 40 mg IV x1 with no significant response -She was given Lasix 40 mg IV x1 yesterday -Pulmonology was consulted; patient continues to decline as she is not participating in therapies recommended by pulmonology -palliative care consulted for  discussion of goals of care  Acute on chronic diastolic heart failure -BNP on admission was 220 -Repeat BNP is 273 -Patient has received IV Lasix with no significant improvement -We will avoid giving further diuresis  Paroxysmal atrial flutter -Rate controlled; continue amiodarone -Continue apixaban for anticoagulation  Hypertension -Continue  isosorbide -Blood pressure is stable  Hypothyroidism -Continue Synthroid  Dyslipidemia -Continue atorvastatin  Hypovolemic hyponatremia -Resolved  Anemia -Hemoglobin dropped from 11.3 on 11/21 to 9.7 today -No overt signs of bleeding -FOBT ordered -Follow CBC in a.m.  Right upper extremity edema -Venous duplex of right upper extremity was obtained which was negative for DVT take  Goals of care -On 11/26 called and discussed with daughter that patient's oxygen requirement has gone up consistently since hospitalization despite treatment with IV antibiotics.  Patient is DNR/DNI.  If patient does not do well over the next few days then consider comfort measures. -Palliative care consulted  Medications     amiodarone  200 mg Oral Daily   apixaban  2.5 mg Oral BID   atorvastatin  20 mg Oral q1800   darifenacin  7.5 mg Oral Daily   fluticasone  2 spray Each Nare BID   guaiFENesin  600 mg Oral BID   ipratropium-albuterol  3 mL Nebulization Q6H   isosorbide mononitrate  60 mg Oral Daily   levothyroxine  62.5 mcg Oral Q0600   pantoprazole  40 mg Oral Daily   polyethylene glycol  17 g Oral Daily   saccharomyces boulardii  250 mg Oral BID     Data Reviewed:   CBG:  Recent Labs  Lab 01/20/21 2139  GLUCAP 128*    SpO2: 93 % O2 Flow Rate (L/min): 20 L/min FiO2 (%): 40 %    Vitals:   01/26/21 0346 01/26/21 0535 01/26/21 0916 01/26/21 1241  BP: (!) 100/50     Pulse: (!) 58     Resp: 18     Temp: 97.8 F (36.6 C)   97.6 F (36.4 C)  TempSrc: Oral   Oral  SpO2: 95% 96% 93%   Weight:      Height:         Intake/Output Summary (Last 24 hours) at 01/26/2021 1436 Last data filed at 01/26/2021 0800 Gross per 24 hour  Intake 630.44 ml  Output --  Net 630.44 ml    11/26 1901 - 11/28 0700 In: 750.4 [P.O.:540] Out: 250 [Urine:250]  Filed Weights   01/19/21 1333  Weight: 48.5 kg    Data Reviewed: Basic Metabolic Panel: Recent Labs  Lab 01/21/21 0201  01/22/21 1011 01/24/21 0054 01/26/21 0049  NA 135 138 137 139  K 3.9 4.2 3.9 3.8  CL 107 107 104 107  CO2 22 26 26 28   GLUCOSE 394* 98 83 89  BUN 16 15 15 16   CREATININE 0.89 0.79 0.71 0.92  CALCIUM 7.7* 8.5* 8.3* 8.3*   Liver Function Tests: No results for input(s): AST, ALT, ALKPHOS, BILITOT, PROT, ALBUMIN in the last 168 hours. No results for input(s): LIPASE, AMYLASE in the last 168 hours. No results for input(s): AMMONIA in the last 168 hours. CBC: Recent Labs  Lab 01/21/21 0201 01/22/21 1011 01/23/21 0106 01/24/21 0054 01/26/21 0049  WBC 20.7* 17.3* 17.5* 15.9* 13.6*  HGB 8.8* 9.6* 8.6* 9.7* 8.6*  HCT 28.4* 29.7* 27.9* 31.2* 27.7*  MCV 94.0 94.0 93.9 94.3 94.9  PLT 232 220 232 241 254   Cardiac Enzymes: No results for input(s): CKTOTAL, CKMB, CKMBINDEX, TROPONINI in the last 168 hours. BNP (last 3 results) Recent Labs    01/19/21 1355 01/19/21 1653 01/24/21 0956  BNP 223.7* 220.0* 273.6*    ProBNP (last 3 results) No results for input(s): PROBNP in the last 8760 hours.  CBG: Recent Labs  Lab 01/20/21 2139  GLUCAP 128*       Radiology Reports  VAS [place] UPPER EXTREMITY VENOUS DUPLEX  Result Date: 01/26/2021 UPPER VENOUS STUDY  Summary:  Right: No evidence of deep vein thrombosis in the upper extremity. No evidence of superficial vein thrombosis in the upper extremity.  Left: No evidence of thrombosis in the subclavian.  *See table(s) above for measurements and observations.  Diagnosing physician: Monica Martinez MD Electronically signed by Monica Martinez MD on 01/26/2021 at 2:08:23 PM.    Final    ECHOCARDIOGRAM LIMITED  Result Date: 01/25/2021    ECHOCARDIOGRAM LIMITED REPORT  FINDINGS  Left Ventricle: Left ventricular ejection fraction, by estimation, is 65 to 70%. The left ventricle has normal function. The left ventricle has no regional wall motion abnormalities. The left ventricular internal cavity size was normal in size. There is  no left  ventricular hypertrophy. The interventricular septum is flattened in systole and diastole, consistent with right ventricular pressure and volume overload. Left ventricular diastolic function could not be evaluated. Left ventricular diastolic function could not be evaluated due to atrial fibrillation. Right Ventricle: The right ventricular size is severely enlarged. No increase in right ventricular wall thickness. Right ventricular systolic function is moderately reduced. There is moderately elevated pulmonary artery systolic pressure.       Antibiotics: Anti-infectives (From admission, onward)    Start     Dose/Rate Route Frequency Ordered Stop   01/20/21 1800  ampicillin-sulbactam (UNASYN) 1.5 g in sodium chloride 0.9 % 100 mL IVPB        1.5 g 200 mL/hr over 30 Minutes Intravenous Every 12 hours 01/20/21 1417 01/27/21 1759   01/20/21 1500  cefTRIAXone (ROCEPHIN) 2 g in sodium chloride 0.9 % 100 mL IVPB  Status:  Discontinued  2 g 200 mL/hr over 30 Minutes Intravenous Every 24 hours 01/19/21 1621 01/20/21 1414   01/19/21 2200  doxycycline (VIBRA-TABS) tablet 100 mg  Status:  Discontinued        100 mg Oral Every 12 hours 01/19/21 1621 01/20/21 1414   01/19/21 1445  cefTRIAXone (ROCEPHIN) 1 g in sodium chloride 0.9 % 100 mL IVPB        1 g 200 mL/hr over 30 Minutes Intravenous  Once 01/19/21 1443 01/19/21 1539   01/19/21 1445  doxycycline (VIBRA-TABS) tablet 100 mg        100 mg Oral  Once 01/19/21 1443 01/19/21 1508         DVT prophylaxis: Apixaban  Code Status: DNR  Family Communication: Discussed with daughter on phone   Consultants: None  Procedures:     Objective    Physical Examination:  General-appears in no acute distress Heart-S1-S2, regular, no murmur auscultated Lungs-bibasilar crackles auscultated Abdomen-soft, nontender, no organomegaly Extremities-no edema in the lower extremities Neuro-alert, oriented x3, no focal deficit noted  Status is:  Inpatient  Dispo: The patient is from: Skilled nursing facility              Anticipated d/c is to: Skilled nursing facility              Anticipated d/c date is: 01/26/2021              Patient currently not stable for discharge  Barrier to discharge-ongoing treatment for acute hypoxemic respiratory failure  COVID-19 Labs  No results for input(s): DDIMER, FERRITIN, LDH, CRP in the last 72 hours.  Lab Results  Component Value Date   SARSCOV2NAA NEGATIVE 01/19/2021   [place] NEGATIVE 01/12/2021   [place] NEGATIVE 01/09/2021   [place] NEGATIVE 01/06/2021            Recent Results (from the past 240 hour(s))  Culture, blood (routine x 2)     Status: None   Collection Time: 01/19/21  1:55 PM   Specimen: BLOOD RIGHT FOREARM  Result Value Ref Range Status   Specimen Description BLOOD RIGHT FOREARM  Final   Special Requests   Final    BOTTLES DRAWN AEROBIC AND ANAEROBIC Blood Culture adequate volume   Culture   Final    NO GROWTH 5 DAYS Performed at [facility], [address], [place], [place] [address]    Report Status 01/24/2021 FINAL  Final  Resp Panel by RT-PCR (Flu A&B, Covid)     Status: None   Collection Time: 01/19/21  2:00 PM   Specimen: Nasopharyngeal(NP) swabs in vial transport medium  Result Value Ref Range Status   SARS Coronavirus 2 by RT PCR NEGATIVE NEGATIVE Final    Comment: (NOTE) SARS-CoV-2 target nucleic acids are NOT DETECTED.  The SARS-CoV-2 RNA is generally detectable in upper respiratory specimens during the acute phase of infection. The lowest concentration of SARS-CoV-2 viral copies this assay can detect is 138 copies/mL. A negative result does not preclude SARS-Cov-2 infection and should not be used as the sole basis for treatment or other patient management decisions. A negative result may occur with  improper specimen collection/handling, submission of specimen other than nasopharyngeal swab, presence of viral  mutation(s) within the areas targeted by this assay, and inadequate number of viral copies(<138 copies/mL). A negative result must be combined with clinical observations, patient history, and epidemiological information. The expected result is Negative.  Fact Sheet for Patients:  EntrepreneurPulse.com.au  Fact Sheet for Healthcare  Providers:  IncredibleEmployment.be  This test is no t yet approved or cleared by the [place] and  has been authorized for detection and/or diagnosis of SARS-CoV-2 by FDA under an Emergency Use Authorization (EUA). This EUA will remain  in effect (meaning this test can be used) for the duration of the COVID-19 declaration under Section 564(b)(1) of the Act, 21 U.S.C.section 360bbb-3(b)(1), unless the authorization is terminated  or revoked sooner.       Influenza A by PCR NEGATIVE NEGATIVE Final   Influenza B by PCR NEGATIVE NEGATIVE Final    Comment: (NOTE) The Xpert Xpress SARS-CoV-2/FLU/RSV plus assay is intended as an aid in the diagnosis of influenza from Nasopharyngeal swab specimens and should not be used as a sole basis for treatment. Nasal washings and aspirates are unacceptable for Xpert Xpress SARS-CoV-2/FLU/RSV testing.  Fact Sheet for Patients: EntrepreneurPulse.com.au  Fact Sheet for Healthcare Providers: IncredibleEmployment.be  This test is not yet approved or cleared by the [place] FDA and has been authorized for detection and/or diagnosis of SARS-CoV-2 by FDA under an Emergency Use Authorization (EUA). This EUA will remain in effect (meaning this test can be used) for the duration of the COVID-19 declaration under Section 564(b)(1) of the Act, 21 U.S.C. section 360bbb-3(b)(1), unless the authorization is terminated or revoked.  Performed at [facility], [place] [address], [place], [place] [address]   Culture, blood (routine x 2)      Status: None   Collection Time: 01/19/21  3:07 PM   Specimen: BLOOD  Result Value Ref Range Status   Specimen Description BLOOD LEFT ANTECUBITAL  Final   Special Requests   Final    BOTTLES DRAWN AEROBIC AND ANAEROBIC Blood Culture adequate volume   Culture   Final    NO GROWTH 5 DAYS Performed at [facility], [place] [address], [place], [place] [address]    Report Status 01/24/2021 FINAL  Final  MRSA Next Gen by PCR, Nasal     Status: None   Collection Time: 01/20/21  3:51 PM   Specimen: Nasal Mucosa; Nasal Swab  Result Value Ref Range Status   MRSA by PCR Next Gen NOT DETECTED NOT DETECTED Final    Comment: (NOTE) The GeneXpert MRSA Assay (FDA approved for NASAL specimens only), is one component of a comprehensive MRSA colonization surveillance program. It is not intended to diagnose MRSA infection nor to guide or monitor treatment for MRSA infections. Test performance is not FDA approved in patients less than 75 years old. Performed at [facility], [place] [address], [place], [place] [address]     [facility] If 7PM-7AM, please contact night-coverage at [email or web address], Office  [phone]   01/26/2021, 2:36 PM  LOS: 7 days

## 2021-01-27 DIAGNOSIS — R0902 Hypoxemia: Secondary | ICD-10-CM

## 2021-01-27 DIAGNOSIS — R9431 Abnormal electrocardiogram [ECG] [EKG]: Secondary | ICD-10-CM | POA: Diagnosis not present

## 2021-01-27 DIAGNOSIS — Z7189 Other specified counseling: Secondary | ICD-10-CM | POA: Diagnosis not present

## 2021-01-27 DIAGNOSIS — Z515 Encounter for palliative care: Secondary | ICD-10-CM | POA: Diagnosis not present

## 2021-01-27 DIAGNOSIS — J9621 Acute and chronic respiratory failure with hypoxia: Secondary | ICD-10-CM | POA: Diagnosis not present

## 2021-01-27 LAB — TROPONIN I (HIGH SENSITIVITY): Troponin I (High Sensitivity): 16 ng/L (ref <18)

## 2021-01-27 NOTE — Progress Notes (Addendum)
Called by RN that evening called and told that patient was having ST elevationIn leads III and aVF.  Patient seen, she is completely asymptomatic.  Denies chest pain or shortness of breath.  Stat EKG obtained shows mild ST elevation in lead III and aVF  Called and discussed with Dr. Terri Skains.  He says that patient is asymptomatic, recommended contacting [place] [facility] who saw the patient recently.  Will consult [facility]   Critical care time spent 35 minutes

## 2021-01-27 NOTE — Progress Notes (Signed)
Daily Progress Note   Patient Name: Cathy [name]       Date: 01/27/2021 DOB: 11/12/1927  Age: 87 y.o. MRN#: [id number] Attending Physician: Oswald Hillock, MD Primary Care Physician: Biagio Borg, MD Admit Date: 01/19/2021 Length of Stay: 8 days  Reason for Consultation/Follow-up: Establishing goals of care  HPI/Patient Profile:  87 y.o. female  with past medical history of hypertension, dyslipidemia, heart failure, paroxysmal atrial flutter, hiatal hernia, osteoarthritis presented with cough and dyspnea.  She had recent hospitalization from 01/06/2021 to 01/12/2021 for acute hypoxemic respiratory failure due to pneumonia and rhinovirus infection.  She received antibiotic therapy and was discharged to skilled nursing facility on supplemental oxygen 3 L/min. At facility she continued to have cough and was requiring 4 L of oxygen, O2 sats was 88%.  She was brought to the ED.  Chest x-ray showed new interstitial/alveolar infiltrate right upper lobe and left lower lobe. She was admitted on 01/19/2021 with acute hypoxemic respiratory failure, diastolic heart failure, paroxysmal A. fib, and others.    During her hospitalization she developed increasing O2 requirements up to 15 L despite diuresis.  Swallow eval 11/25 with no aspiration.  No history of lung issues.  Pulmonary was consulted for further recommendations.  Recommended out of bed greater than 30 degrees at all times as due to reflux, DuoNebs, pulmonary hygiene, encourage mobilization and chest physiotherapy.  Overall poor prognosis.   PMT was consulted for goals of care conversation.  Hospitalist spoke with daughter about consistent increasing oxygen requirements over the past several days despite antibiotics.  Currently a DNR and recommended if no improvement over the next few days, they would recommend comfort measures.  Subjective:   Subjective: Chart Reviewed. Updates received. Patient Assessed. Created space and opportunity for patient   and family to explore thoughts and feelings regarding current medical situation.  Today's Discussion: I saw the patient at the bedside, no family present. She was out of the bed and in the chair. States it feels good to get out of the bed for a bit. She ate 50-75% of breakfast. States she's feeling ok, no pain or significant dyspnea. She states she feel slike she's taken a step backward. When I asked to elaborate she stated she's tired: "you do and do and do, but it wears you out." I shared that her labs are looking better, but she still requires a lot of oxygen, I ask if she's still wanting to do all the treatments to try and get better. She states "I have no choice" to which I offered we always have choices, it just depends on what our goals are. She replied "in this there is no choice" and confirms she wants to continue full scope of treatment. We shared her family's support and lover for her and she agrees saying she has a great family. I indicated I would be off for a couple days but that we have providers available to support as needed; otherwise I would see her in a couple days. She agreed.  I offered emotional and general support through therapeutic listening, empathy, sharing of stories/reminiscing, and other techniques. I answered all questions and address all concerns.  Review of Systems  Constitutional:  Positive for fatigue.  Respiratory:  Negative for shortness of breath.   Gastrointestinal:  Negative for abdominal pain, nausea and vomiting.   Objective:   Vital Signs:  BP 116/64 (BP Location: Left Arm)   Pulse 67   Temp 98.4 F (36.9 C) (Oral)  Resp 20   Ht 4\' 7"  (1.397 m)   Wt 48.5 kg   SpO2 95%   BMI 24.87 kg/m   Physical Exam: Physical Exam Vitals and nursing note reviewed.  Constitutional:      General: She is not in acute distress.    Appearance: She is ill-appearing. She is not toxic-appearing.  HENT:     Head: Normocephalic and atraumatic.  Cardiovascular:      Rate and Rhythm: Normal rate.  Pulmonary:     Effort: No respiratory distress.     Breath sounds: No wheezing.  Abdominal:     General: Abdomen is flat.     Palpations: Abdomen is soft.  Skin:    General: Skin is warm and dry.  Neurological:     Mental Status: She is alert.  Psychiatric:        Mood and Affect: Mood normal.        Behavior: Behavior normal.    Palliative Assessment/Data: 50%   Assessment & Plan:   Impression: Present on Admission: . Acute on chronic respiratory failure with hypoxemia ([place]) . Prolonged Q-T interval on ECG . Hypothyroidism . Normocytic anemia . Atrial fibrillation and flutter ([place]) . Heart failure with preserved ejection fraction ([place]) . Aspiration pneumonia [facility])  87 year old female with chronic comorbidities as described above and recent hospitalization for double pneumonia and rhinovirus.  She was transferred to a skilled nursing facility where she had some desaturation episodes and was admitted for respiratory failure with hypoxemia, acute on chronic diastolic congestive heart failure, A. fib.  She has had increasing oxygen requirements from 10 L to 15 L to 20 L, high flow nasal cannula.  The patient was made a DNR over the weekend and recommended follow for 24 to 48 hours and if no improvement may consider comfort care.  Unfortunately, today she does appear quite frail and lethargic.  I had a frank discussion with the patient's daughter who understands the severity of her illness.  We agreed to take it a day at a time for any signs of improvement.  Overall goal is to get better and get home, but she is realistic that this may not be possible.  SUMMARY OF RECOMMENDATIONS   Remain DNR Continue current course of treatment Continue significant pulmonary interventions for improvement Take it a day at a time, treat the treatable Goals are clear, will follow peripherally Please call us if we can be of further assistance  Code Status:  DNR  Prognosis: Unable to determine  Discharge Planning: To Be Determined  Discussed with: Medical team, nursing team, patient  Thank you for allowing [place] to participate in the care of Cathy [name] PMT will continue to support holistically.  Time Total: 35 min  Visit consisted of counseling and education dealing with the complex and emotionally intense issues of symptom management and palliative care in the setting of serious and potentially life-threatening illness. Greater than 50%  of this time was spent counseling and coordinating care related to the above assessment and plan.  Walden Field, NP Palliative Medicine Team  Team Phone # [phone] (Nights/Weekends)  10/28/2020, 8:17 AM

## 2021-01-27 NOTE — Progress Notes (Signed)
[facility]  PROGRESS NOTE  Cathy [name] QQP:[id number] DOB: September 16, 1927 DOA: 01/19/2021 PCP: Biagio Borg, MD   Brief HPI:   87 year old female with medical history of hypertension, dyslipidemia, heart failure, paroxysmal atrial flutter, hiatal hernia, osteoarthritis presented with cough and dyspnea.  She had recent hospitalization from 01/06/2021 to 01/12/2021 for acute hypoxemic respiratory failure due to pneumonia and rhinovirus infection.  She received antibiotic therapy and was discharged to skilled nursing facility on supplemental oxygen 3 L/min. At facility she continued to have cough and was requiring 4 L of oxygen, O2 sats was 88%.  She was brought to the ED.  Chest x-ray showed new interstitial/alveolar infiltrate right upper lobe and left lower lobe.  Patient started on IV antibiotic therapy.    Subjective   Today very lethargic, barely opens eyes to sternal rub.  Still requiring 20 L/min HFNC   Assessment/Plan:    Acute hypoxemic respiratory failure -Secondary to multifocal pneumonia, right upper lobe and left lower lobe -Swallow evaluation shows no aspiration  -Patient was treated with 7 days of IV Unasyn -WBC down to 15.9 -Continue bronchodilator therapy -Chest x-ray shows persistent infiltrates on right side, also possible pulmonary edema -She received Lasix 40 mg IV  -Pulmonology was consulted; patient continues to decline as she is not participating in therapies recommended by pulmonology -palliative care consulted for  discussion of goals of care  Acute on chronic diastolic heart failure -BNP on admission was 220 -Repeat BNP is 273 -Patient has received IV Lasix with no significant improvement -We will avoid giving further diuresis  Paroxysmal atrial flutter -Rate controlled; continue amiodarone -Continue apixaban for anticoagulation  Hypertension -Continue isosorbide -Blood pressure is stable  Hypothyroidism -Continue  Synthroid  Dyslipidemia -Continue atorvastatin  Hypovolemic hyponatremia -Resolved  Anemia -Hemoglobin dropped from 11.3 on 11/21 to 9.7 today -No overt signs of bleeding -FOBT ordered -Follow CBC in a.m.  Right upper extremity edema -Venous duplex of right upper extremity was obtained which was negative for DVT take  Goals of care -On 11/26 called and discussed with daughter that patient's oxygen requirement has gone up consistently since hospitalization despite treatment with IV antibiotics.  Patient is DNR/DNI.  If patient does not do well over the next few days then consider comfort measures. -Palliative care consulted  Patient continues to decline, family and discussion with palliative care.  Patient not significantly improved.  She continues to decline.  Extremely poor prognosis.  My assessment patient only has hours to days.  Medications     amiodarone  200 mg Oral Daily   apixaban  2.5 mg Oral BID   atorvastatin  20 mg Oral q1800   darifenacin  7.5 mg Oral Daily   fluticasone  2 spray Each Nare BID   guaiFENesin  600 mg Oral BID   ipratropium-albuterol  3 mL Nebulization Q6H   isosorbide mononitrate  60 mg Oral Daily   levothyroxine  62.5 mcg Oral Q0600   pantoprazole  40 mg Oral Daily   polyethylene glycol  17 g Oral Daily   saccharomyces boulardii  250 mg Oral BID     Data Reviewed:   CBG:  Recent Labs  Lab 01/20/21 2139  GLUCAP 128*    SpO2: 95 % O2 Flow Rate (L/min): 20 L/min FiO2 (%): 40 %    Vitals:   01/27/21 0148 01/27/21 0301 01/27/21 0753 01/27/21 0809  BP:  101/61 116/64 116/64  Pulse:  75 63 67  Resp:  (!) 22 (!) 21 20  Temp:  98.2 F (36.8 C) 98.3 F (36.8 C) 98.4 F (36.9 C)  TempSrc:  Oral Oral Oral  SpO2: 92% 94% 94% 95%  Weight:      Height:         Intake/Output Summary (Last 24 hours) at 01/27/2021 0904 Last data filed at 01/27/2021 0800 Gross per 24 hour  Intake 260 ml  Output --  Net 260 ml    11/27 1901 -  11/29 0700 In: 590.4 [P.O.:380] Out: -   Filed Weights   01/19/21 1333  Weight: 48.5 kg    Data Reviewed: Basic Metabolic Panel: Recent Labs  Lab 01/21/21 0201 01/22/21 1011 01/24/21 0054 01/26/21 0049  NA 135 138 137 139  K 3.9 4.2 3.9 3.8  CL 107 107 104 107  CO2 22 26 26 28   GLUCOSE 394* 98 83 89  BUN 16 15 15 16   CREATININE 0.89 0.79 0.71 0.92  CALCIUM 7.7* 8.5* 8.3* 8.3*   Liver Function Tests: No results for input(s): AST, ALT, ALKPHOS, BILITOT, PROT, ALBUMIN in the last 168 hours. No results for input(s): LIPASE, AMYLASE in the last 168 hours. No results for input(s): AMMONIA in the last 168 hours. CBC: Recent Labs  Lab 01/21/21 0201 01/22/21 1011 01/23/21 0106 01/24/21 0054 01/26/21 0049  WBC 20.7* 17.3* 17.5* 15.9* 13.6*  HGB 8.8* 9.6* 8.6* 9.7* 8.6*  HCT 28.4* 29.7* 27.9* 31.2* 27.7*  MCV 94.0 94.0 93.9 94.3 94.9  PLT 232 220 232 241 254   Cardiac Enzymes: No results for input(s): CKTOTAL, CKMB, CKMBINDEX, TROPONINI in the last 168 hours. BNP (last 3 results) Recent Labs    01/19/21 1355 01/19/21 1653 01/24/21 0956  BNP 223.7* 220.0* 273.6*    ProBNP (last 3 results) No results for input(s): PROBNP in the last 8760 hours.  CBG: Recent Labs  Lab 01/20/21 2139  GLUCAP 128*       Radiology Reports  VAS [place] UPPER EXTREMITY VENOUS DUPLEX  Result Date: 01/26/2021 UPPER VENOUS STUDY  Summary:  Right: No evidence of deep vein thrombosis in the upper extremity. No evidence of superficial vein thrombosis in the upper extremity.  Left: No evidence of thrombosis in the subclavian.  *See table(s) above for measurements and observations.  Diagnosing physician: Monica Martinez MD Electronically signed by Monica Martinez MD on 01/26/2021 at 2:08:23 PM.    Final    ECHOCARDIOGRAM LIMITED  Result Date: 01/25/2021    ECHOCARDIOGRAM LIMITED REPORT  FINDINGS  Left Ventricle: Left ventricular ejection fraction, by estimation, is 65 to 70%. The left  ventricle has normal function. The left ventricle has no regional wall motion abnormalities. The left ventricular internal cavity size was normal in size. There is  no left ventricular hypertrophy. The interventricular septum is flattened in systole and diastole, consistent with right ventricular pressure and volume overload. Left ventricular diastolic function could not be evaluated. Left ventricular diastolic function could not be evaluated due to atrial fibrillation. Right Ventricle: The right ventricular size is severely enlarged. No increase in right ventricular wall thickness. Right ventricular systolic function is moderately reduced. There is moderately elevated pulmonary artery systolic pressure.       Antibiotics: Anti-infectives (From admission, onward)    Start     Dose/Rate Route Frequency Ordered Stop   01/20/21 1800  ampicillin-sulbactam (UNASYN) 1.5 g in sodium chloride 0.9 % 100 mL IVPB        1.5 g 200 mL/hr over 30 Minutes Intravenous Every 12 hours 01/20/21 1417 01/27/21 0604  01/20/21 1500  cefTRIAXone (ROCEPHIN) 2 g in sodium chloride 0.9 % 100 mL IVPB  Status:  Discontinued        2 g 200 mL/hr over 30 Minutes Intravenous Every 24 hours 01/19/21 1621 01/20/21 1414   01/19/21 2200  doxycycline (VIBRA-TABS) tablet 100 mg  Status:  Discontinued        100 mg Oral Every 12 hours 01/19/21 1621 01/20/21 1414   01/19/21 1445  cefTRIAXone (ROCEPHIN) 1 g in sodium chloride 0.9 % 100 mL IVPB        1 g 200 mL/hr over 30 Minutes Intravenous  Once 01/19/21 1443 01/19/21 1539   01/19/21 1445  doxycycline (VIBRA-TABS) tablet 100 mg        100 mg Oral  Once 01/19/21 1443 01/19/21 1508         DVT prophylaxis: Apixaban  Code Status: DNR  Family Communication: Discussed with daughter on phone   Consultants: None  Procedures:     Objective    Physical Examination:  General-appears lethargic, Heart-S1-S2, regular, no murmur auscultated Lungs-clear to auscultation  bilaterally, no wheezing or crackles auscultated Abdomen-soft, nontender, no organomegaly Extremities-no edema in the lower extremities Neuro-somnolent, barely opens eyes to sternal rub, not following commands   Status is: Inpatient  Dispo: The patient is from: Skilled nursing facility              Anticipated d/c is to: Skilled nursing facility              Anticipated d/c date is: 01/29/2021              Patient currently not stable for discharge  Barrier to discharge-ongoing treatment for acute hypoxemic respiratory failure  COVID-19 Labs  No results for input(s): DDIMER, FERRITIN, LDH, CRP in the last 72 hours.  Lab Results  Component Value Date   SARSCOV2NAA NEGATIVE 01/19/2021   [place] NEGATIVE 01/12/2021   [place] NEGATIVE 01/09/2021   [place] NEGATIVE 01/06/2021            Recent Results (from the past 240 hour(s))  Culture, blood (routine x 2)     Status: None   Collection Time: 01/19/21  1:55 PM   Specimen: BLOOD RIGHT FOREARM  Result Value Ref Range Status   Specimen Description BLOOD RIGHT FOREARM  Final   Special Requests   Final    BOTTLES DRAWN AEROBIC AND ANAEROBIC Blood Culture adequate volume   Culture   Final    NO GROWTH 5 DAYS Performed at [facility], [address], [place], [place] [address]    Report Status 01/24/2021 FINAL  Final  Resp Panel by RT-PCR (Flu A&B, Covid)     Status: None   Collection Time: 01/19/21  2:00 PM   Specimen: Nasopharyngeal(NP) swabs in vial transport medium  Result Value Ref Range Status   SARS Coronavirus 2 by RT PCR NEGATIVE NEGATIVE Final    Comment: (NOTE) SARS-CoV-2 target nucleic acids are NOT DETECTED.  The SARS-CoV-2 RNA is generally detectable in upper respiratory specimens during the acute phase of infection. The lowest concentration of SARS-CoV-2 viral copies this assay can detect is 138 copies/mL. A negative result does not preclude SARS-Cov-2 infection and should not be used  as the sole basis for treatment or other patient management decisions. A negative result may occur with  improper specimen collection/handling, submission of specimen other than nasopharyngeal swab, presence of viral mutation(s) within the areas targeted by this assay, and inadequate number of viral  copies(<138 copies/mL). A negative result must be combined with clinical observations, patient history, and epidemiological information. The expected result is Negative.  Fact Sheet for Patients:  EntrepreneurPulse.com.au  Fact Sheet for Healthcare Providers:  IncredibleEmployment.be  This test is no t yet approved or cleared by the [place] FDA and  has been authorized for detection and/or diagnosis of SARS-CoV-2 by FDA under an Emergency Use Authorization (EUA). This EUA will remain  in effect (meaning this test can be used) for the duration of the COVID-19 declaration under Section 564(b)(1) of the Act, 21 U.S.C.section 360bbb-3(b)(1), unless the authorization is terminated  or revoked sooner.       Influenza A by PCR NEGATIVE NEGATIVE Final   Influenza B by PCR NEGATIVE NEGATIVE Final    Comment: (NOTE) The Xpert Xpress SARS-CoV-2/FLU/RSV plus assay is intended as an aid in the diagnosis of influenza from Nasopharyngeal swab specimens and should not be used as a sole basis for treatment. Nasal washings and aspirates are unacceptable for Xpert Xpress SARS-CoV-2/FLU/RSV testing.  Fact Sheet for Patients: EntrepreneurPulse.com.au  Fact Sheet for Healthcare Providers: IncredibleEmployment.be  This test is not yet approved or cleared by the [place] FDA and has been authorized for detection and/or diagnosis of SARS-CoV-2 by FDA under an Emergency Use Authorization (EUA). This EUA will remain in effect (meaning this test can be used) for the duration of the COVID-19 declaration under Section 564(b)(1)  of the Act, 21 U.S.C. section 360bbb-3(b)(1), unless the authorization is terminated or revoked.  Performed at [facility], [place] [address] [place], [place] [address]   Culture, blood (routine x 2)     Status: None   Collection Time: 01/19/21  3:07 PM   Specimen: BLOOD  Result Value Ref Range Status   Specimen Description BLOOD LEFT ANTECUBITAL  Final   Special Requests   Final    BOTTLES DRAWN AEROBIC AND ANAEROBIC Blood Culture adequate volume   Culture   Final    NO GROWTH 5 DAYS Performed at [facility], [place] [address] [place], [place] [address]    Report Status 01/24/2021 FINAL  Final  MRSA Next Gen by PCR, Nasal     Status: None   Collection Time: 01/20/21  3:51 PM   Specimen: Nasal Mucosa; Nasal Swab  Result Value Ref Range Status   MRSA by PCR Next Gen NOT DETECTED NOT DETECTED Final    Comment: (NOTE) The GeneXpert MRSA Assay (FDA approved for NASAL specimens only), is one component of a comprehensive MRSA colonization surveillance program. It is not intended to diagnose MRSA infection nor to guide or monitor treatment for MRSA infections. Test performance is not FDA approved in patients less than 52 years old. Performed at [facility], [place] [address] [place], [place] [address]     [facility] If 7PM-7AM, please contact night-coverage at [email or web address], Office  [phone]   01/27/2021, 9:04 AM  LOS: 8 days

## 2021-01-27 NOTE — TOC Progression Note (Signed)
Transition of Care [facility]) - Progression Note    Patient Details  Name: Cathy [name] MRN: [id number] Date of Birth: 1927/06/14  Transition of Care [facility]) CM/SW Contact  Reece Agar, [place] Phone Number: 01/27/2021, 9:47 AM  Clinical Narrative:    Pt not medically ready for DC, CSW will continue to follow. Pt has bed at [facility] when ready but will need new insurance auth prior to dc.   Expected Discharge Plan: [place] Barriers to Discharge: Continued Medical Work up  Expected Discharge Plan and Services Expected Discharge Plan: [place] In-house Referral: Clinical Social Work   Post Acute Care Choice: [place] Living arrangements for the past 2 months: [place], [place]                                       Social Determinants of Health (SDOH) Interventions    Readmission Risk Interventions No flowsheet data found.

## 2021-01-27 NOTE — TOC Progression Note (Signed)
Transition of Care [facility]) - Progression Note    Patient Details  Name: GEMINI BUNTE MRN: [id number] Date of Birth: 1927/09/24  Transition [facility]) CM/SW Contact  Zenon Mayo, RN Phone Number: 01/27/2021, 4:13 PM  Clinical Narrative:    Conts on HFNC 40%, palliative consulted for GOC, TOC will continue to follow for dc needs.   Expected Discharge Plan: [place] Barriers to Discharge: Continued Medical Work up  Expected Discharge Plan and Services Expected Discharge Plan: [place] In-house Referral: Clinical Social Work   Post Acute Care Choice: [place] Living arrangements for the past 2 months: [place], [place]                                       Social Determinants of Health (SDOH) Interventions    Readmission Risk Interventions No flowsheet data found.

## 2021-01-27 NOTE — Progress Notes (Signed)
Physical Therapy Treatment Patient Details Name: Cathy [name] MRN: [id number] DOB: 03-18-1927 Today's Date: 01/27/2021   History of Present Illness Pt is a 87 y.o. female admited from SNF on 01/19/21 with cough and dyspnea; workup for acute hypoxic respiratory failure secondary to multifocal PNA, requiring heated HFNC. Of note, recent admission 11/8-11/14/22 with PNA and rhinovirus infection with d/c to SNF for short-term rehab. PMH includes mild dementia, HTN, HF, aflutter, hernia, OA.   PT Comments    Pt slowly progressing with mobility, limited by increased fatigue and tenuous respiratory status. Pt requiring minA for brief bout of standing activity, requiring prolonged seated rest to recover before she could attempt to start eating breakfast; unable to progress ambulation secondary to fatigue and DOE. Continue to recommend post-acute rehab services to maximize functional mobility and decrease caregiver burden. Will follow acutely to address established goals.  SpO2 91% on 20L O2 HHFNC at 40% FiO2    Recommendations for follow up therapy are one component of a multi-disciplinary discharge planning process, led by the attending physician.  Recommendations may be updated based on patient status, additional functional criteria and insurance authorization.  Follow Up Recommendations  Skilled nursing-short term rehab (<3 hours/day)     Assistance Recommended at Discharge Frequent or constant Supervision/Assistance  Equipment Recommendations  None recommended by PT    Recommendations for Other Services       Precautions / Restrictions Precautions Precautions: Fall;Other (comment) Precaution Comments: on 20L O2 HHFNC Restrictions Weight Bearing Restrictions: No     Mobility  Bed Mobility Overal bed mobility: Needs Assistance Bed Mobility: Supine to Sit     Supine to sit: Min assist;HOB elevated     General bed mobility comments: MinA for HHA to elevate trunk, then pt able to  scoot self to EOB    Transfers Overall transfer level: Needs assistance Equipment used: 1 person hand held assist Transfers: Sit to/from Stand;Bed to chair/wheelchair/BSC Sit to Stand: Min assist     Step pivot transfers: Min assist     General transfer comment: Pt declined RW use, minA for HHA to elevate trunk and maintain stability with step pivot to recliner; pt declined additional standing attempts due to fatigue; noted SOB and cough    Ambulation/Gait                   Stairs             Wheelchair Mobility    Modified Rankin (Stroke Patients Only)       Balance Overall balance assessment: Needs assistance Sitting-balance support: Feet supported Sitting balance-Leahy Scale: Fair     Standing balance support: Bilateral upper extremity supported;During functional activity;Single extremity supported Standing balance-Leahy Scale: Poor Standing balance comment: Reliant on HHA to maintain balance, also reaching for additional UE support on furniture                            Cognition Arousal/Alertness: Awake/alert Behavior During Therapy: WFL for tasks assessed/performed;Flat affect Overall Cognitive Status: History of cognitive impairments - at baseline                                 General Comments: H/o dementia per chart. Pt following simple commands appropriately; endorses significant fatigue        Exercises      General Comments General comments (skin integrity, edema, etc.): SpO2 91%  on 20L O2 HHFNC at 40% FiO2. Pt requiring assist for meal set-up      Pertinent Vitals/Pain Pain Assessment: No/denies pain Pain Intervention(s): Monitored during session    Home Living                          Prior Function            PT Goals (current goals can now be found in the care plan section) Progress towards PT goals: Progressing toward goals (slowly, limited by fatigue and respiratory status)     Frequency    Min 2X/week      PT Plan Current plan remains appropriate    Co-evaluation              AM-PAC PT "6 Clicks" Mobility   Outcome Measure  Help needed turning from your back to your side while in a flat bed without using bedrails?: A Little Help needed moving from lying on your back to sitting on the side of a flat bed without using bedrails?: A Little Help needed moving to and from a bed to a chair (including a wheelchair)?: A Little Help needed standing up from a chair using your arms (e.g., wheelchair or bedside chair)?: A Little Help needed to walk in hospital room?: Total Help needed climbing 3-5 steps with a railing? : Total 6 Click Score: 14    End of Session Equipment Utilized During Treatment: Oxygen Activity Tolerance: Patient limited by fatigue Patient left: in chair;with call bell/phone within reach;with chair alarm set Nurse Communication: Mobility status PT Visit Diagnosis: Muscle weakness (generalized) (M62.81);Difficulty in walking, not elsewhere classified (R26.2)     Time: 8299-3716 PT Time Calculation (min) (ACUTE ONLY): 20 min  Charges:  $Therapeutic Activity: 8-22 mins                     Mabeline Caras, PT, DPT Acute Rehabilitation Services  Pager [phone] Office [phone]  Derry Lory 01/27/2021, 10:25 AM

## 2021-01-27 NOTE — Consult Note (Signed)
Cardiology Consultation:   Patient ID: Cathy [name] MRN: [id number]; DOB: 1927-06-09  Admit date: 01/19/2021 Date of Consult: 01/27/2021  PCP:  Biagio Borg, MD   [facility] Cardiologist:  Sinclair Grooms, MD  Electrophysiologist:  Constance Haw, MD     Tommye Standard, [facility], 01/02/2021  Patient Profile:   Cathy [name] is a 87 y.o. female with a hx of chronic combined systolic and diastolic CHF (EF previously 25-30%, improved to 55-60% by echo in 07/2015), PAFlutter (s/p DCCV in 03/2015, on Eliquis and amio), HTN, HLD, venous insuff, OA (uses a walker) and chronic anemia, who is being seen 01/27/2021 for the evaluation of CHF at the request of Dr Darrick Meigs.  History of Present Illness:   Cathy [name] was seen by Ms Cathy [name] 11/04. She was in atrial flutter, rate controlled, wt 108 lbs. No med changes  She was admitted 11/08-12 with SOB x 2 days, fever, cough, fall. Dx with acute hypoxic respiratory failure/severe sepsis secondary to suspected community-acquired pneumonia/rhinovirus infection.  D/c on O2 at 3 lpm.  Patient met severe sepsis criteria upon admission based on tachypnea, leukocytosis, acute hypoxic respiratory failure and pneumonia. D/c to SNF, [facility]. She was given 1 dose IV Lasix 40 mg on the day of discharge. Wt 50 kg, 110 lbs at d/c.   Pt admitted 11/21 from SNF, O2 sats 88% on 4 lpm O2, improved w/ 6 lpm. Admit wt 48.5 kg, 107 lbs. Dx multifocal PNA based on CXR.   WBC 29.2, Na 131, CBG 394, BNP 220, lactic acid 1.2 and procalcitonin neg, Trop 18, resp panel negative, on BiPAP and ABX. With her sig illness, family meeting had and pt made DNR.  Lasix held  on admit, pt felt euvolemic. In flutter, but rate was controlled, amio and Eliquis continued. WBCs and electrolytes improved.   11/24 SOB worsened and pt O2 needs increased to 13 lpm. Given Lasix 40 mg IV x 1.  11/25, O2 demand 15 lpm.  11/26, still w/ high O2 demand, given Lasix 40 mg IV x 1.  Pulmonology consulted. Adjusted meds and added chest physio. Limited echo performed 11/27, RVSP 45.7, EF 65%, mod pleural eff seen. Pt was lethargic.  11/27 Palliative Care saw, Dtr aware that pt very weak and frail, consider comfort care in 48 hr or so if no improvement.  11/28, O2 needs up to 20 lpm HFNC 11/29, RN contacted Dr Darrick Meigs re: ST elevation in III/AvF >> Cards consult.   Cathy [name] is not lethargic today.  She is awake and alert.  She denies any pain at all.  She has not had chest pain today.  Her shortness of breath is not changed today, she is still on 20 L of high flow nasal cannula.  She has eaten almost nothing today, taking only about 2 bites of her supper.  Upon review of the EKGs, there are some changes, but there is no associated symptoms to account for them.  She is resting comfortably.  She does not want [place] to do anything except change the dressing on her arm over her skin cancer.  It is hurting her.   Past Medical History:  Diagnosis Date   Allergic rhinitis    Anemia    Anxiety    Chronic systolic CHF (congestive heart failure) ([place])    a. 2D Echo 02/02/15: EF 25-30%, akinesis of mid-apical anteroseptal myocardium, grade 3 DD, mod MR, severely dilated LA, mildly reduced RV systoluc function, mod  dilated RA, mod TR, mildly increased PASP 81mHg. b. 10/2016: EF 55-60% with no regional WMA. Grade 3 DD.    CLOSTRIDIUM DIFFICILE COLITIS 05/26/2009   Qualifier: Diagnosis of  By: KNils PyleCMA (AAMA), Leisha     Colitis    lymphocytic colitis feb 2011   Coronary artery calcification    a. By CT 01/2015.   Diverticulosis of colon    DJD (degenerative joint disease)    gets epidural injections   Dysphagia    Hiatal hernia    Hypercholesteremia    Hypertension    Iron deficiency    Irritable bowel syndrome    Liver lesion 02/18/2015   Lumbar back pain    Osteoporosis    Paroxysmal atrial flutter ([place]    a. Dx 01/2015 - underwent DCCV 03/2015; takes Eliquis   Sinus  bradycardia    Splenic lesion 02/18/2015   Squamous cell carcinoma in situ    Urinary incontinence    Venous insufficiency    Vitamin B12 deficiency     Past Surgical History:  Procedure Laterality Date   ABDOMINAL HYSTERECTOMY     CARDIOVERSION N/A 02/03/2015   Procedure: CARDIOVERSION;  Surgeon: TSueanne Margarita MD;  [facility];  Service: Cardiovascular;  Laterality: N/A;   CARDIOVERSION N/A 03/10/2015   Procedure: CARDIOVERSION;  Surgeon: KDorothy Spark MD;  [place] [facility]  Service: Cardiovascular;  Laterality: N/A;   CARDIOVERSION N/A 01/07/2017   Procedure: CARDIOVERSION;  Surgeon: HPixie Casino MD;  [facility];  Service: Cardiovascular;  Laterality: N/A;   cataract surgery     decompressive lumbar laminectomy  06/2008   at L2-3, L3-4 and L4-5 by Dr. JRonnald Ramp  HIP PINNING,CANNULATED Left 06/30/2019   Procedure: CANNULATED LEFT HIP PINNING;  Surgeon: BMcarthur Rossetti MD;  Location: [facility];  Service: Orthopedics;  Laterality: Left;   LAPAROSCOPIC CHOLECYSTECTOMY  04/2009   Dr. HAbran Cantor  REPLACEMENT TOTAL KNEE     TEE WITHOUT CARDIOVERSION N/A 02/03/2015   Procedure: TRANSESOPHAGEAL ECHOCARDIOGRAM (TEE);  Surgeon: TSueanne Margarita MD;  [facility];  Service: Cardiovascular;  Laterality: N/A;     Home Medications:  Prior to Admission medications   Medication Sig Start Date End Date Taking? Authorizing Provider  acetaminophen (TYLENOL) 325 MG tablet Take 650 mg by mouth every 8 (eight) hours. Scheduled   Yes [provider]  Acetaminophen-DM (DELSYM CHILD COUGH+SORE THROAT) 325-10 MG/10ML LIQD Take 10 mLs by mouth every 12 (twelve) hours as needed (cough).   Yes [provider]  amiodarone (PACERONE) 200 MG tablet TAKE 1 TABLET DAILY Patient taking differently: Take 200 mg by mouth daily. 10/28/20  Yes Camnitz, Will MHassell Done MD  apixaban (ELIQUIS) 2.5 MG TABS tablet TAKE 1 TABLET TWICE A DAY Patient taking  differently: Take 2.5 mg by mouth 2 (two) times daily. 01/13/21  Yes Camnitz, Will MHassell Done MD  atorvastatin (LIPITOR) 20 MG tablet TAKE 1 TABLET DAILY AT 6:00IN THE EVENING Patient taking differently: Take 20 mg by mouth daily at 6 PM. 10/28/20  Yes Camnitz, Will MHassell Done MD  calcium-vitamin D (OSCAL WITH D) 500-200 MG-UNIT per tablet Take 1 tablet by mouth daily.    Yes [provider]  dextromethorphan-guaiFENesin (MUCINEX DM) 30-600 MG 12hr tablet Take 1 tablet by mouth 2 (two) times daily.   Yes [provider]  esomeprazole (NEXIUM) 40 MG capsule Take 1 capsule (40 mg total) by mouth daily at 12 noon. 07/23/19  Yes LOscar La Arlo C, PA-C  furosemide (  LASIX) 20 MG tablet Take 1 tablet (20 mg total) by mouth every other day. 08/26/20  Yes Biagio Borg, MD  ibuprofen (ADVIL) 200 MG tablet Take 200 mg by mouth every 6 (six) hours as needed for fever.   Yes [provider]  isosorbide mononitrate (IMDUR) 60 MG 24 hr tablet TAKE 1 TABLET DAILY Patient taking differently: Take 60 mg by mouth daily. 10/28/20  Yes Camnitz, Ocie Doyne, MD  Nutritional Supplements (ENSURE CLEAR PO) Take 120 mLs by mouth in the morning and at bedtime. With lunch and dinner   Yes [provider]  OXYGEN Inhale 3 L into the lungs continuous.   Yes [provider]  polyethylene glycol (MIRALAX / GLYCOLAX) 17 g packet Take 17 g by mouth daily. 07/03/19  Yes [provider]  solifenacin (VESICARE) 10 MG tablet Take 1 tablet (10 mg total) by mouth daily. 07/23/19  Yes Lassen, Arlo C, PA-C  SYNTHROID 125 MCG tablet TAKE 1/2 TABLET DAILY. Patient taking differently: 62.5 mcg daily before breakfast. 04/30/20  Yes Biagio Borg, MD  vitamin B-12 (CYANOCOBALAMIN) 1000 MCG tablet Take 1 tablet (1,000 mcg total) by mouth daily. 06/17/14  Yes Rowe Clack, MD  bacitracin ointment Apply 1 application topically 2 (two) times daily. Patient not taking: Reported on 01/19/2021 10/23/20    Chase Picket, MD  Misc. Devices (TRANSPORT [place]) MISC Use as directed three times daily 08/15/19   Biagio Borg, MD    Inpatient Medications: Scheduled Meds:  amiodarone  200 mg Oral Daily   apixaban  2.5 mg Oral BID   atorvastatin  20 mg Oral q1800   darifenacin  7.5 mg Oral Daily   fluticasone  2 spray Each Nare BID   guaiFENesin  600 mg Oral BID   ipratropium-albuterol  3 mL Nebulization Q6H   isosorbide mononitrate  60 mg Oral Daily   levothyroxine  62.5 mcg Oral Q0600   pantoprazole  40 mg Oral Daily   polyethylene glycol  17 g Oral Daily   saccharomyces boulardii  250 mg Oral BID   Continuous Infusions:  PRN Meds: acetaminophen, guaiFENesin-dextromethorphan, ipratropium-albuterol  Allergies:    Allergies  Allergen Reactions   Amoxicillin-Pot Clavulanate Diarrhea    Has patient had a PCN reaction causing immediate rash, facial/tongue/throat swelling, SOB or lightheadedness with hypotension: no Has patient had a PCN reaction causing severe rash involving mucus membranes or skin necrosis:  no Has patient had a PCN reaction that required hospitalization: no Has patient had a PCN reaction occurring within the last 10 years no If all of the above answers are "NO", then may proceed with Cephalosporin use.    Penicillins Diarrhea   Sulfonamide Derivatives Nausea Only    Social History:   Social History   Socioeconomic History   Marital status: Widowed    Spouse name: Not on file   Number of children: Not on file   Years of education: Not on file   Highest education level: Not on file  Occupational History   Occupation: retired  Tobacco Use   Smoking status: Former    Packs/day: 0.50    Years: 10.00    Pack years: 5.00    Types: Cigarettes   Smokeless tobacco: Never  Vaping Use   Vaping Use: Never used  Substance and Sexual Activity   Alcohol use: No   Drug use: Never   Sexual activity: Not on file  Other Topics Concern   Not on file  Social  History  Narrative   Not on file   Social Determinants of Health   Financial Resource Strain: Not on file  Food Insecurity: Not on file  Transportation Needs: Not on file  Physical Activity: Not on file  Stress: Not on file  Social Connections: Not on file  Intimate Partner Violence: Not on file    Family History:   Family History  Problem Relation Age of Onset   Cancer Brother        lung   Diabetes Son    Coronary artery disease Son    Kidney disease Son    Stroke Son    Heart attack Mother    Cancer Brother        colon   Cancer Daughter        cervical, lung    Heart attack Brother    Diabetes Brother    Heart attack Sister    Hypertension Neg Hx    Fainting Neg Hx      ROS:  Please see the history of present illness.  All other ROS reviewed and negative.     Physical Exam/Data:   Vitals:   01/27/21 0809 01/27/21 1138 01/27/21 1433 01/27/21 1651  BP: 116/64 112/71  117/77  Pulse: 67 70 (!) 59 83  Resp: 20 16 20 20   Temp: 98.4 F (36.9 C) 98.5 F (36.9 C)  98.6 F (37 C)  TempSrc: Oral Oral  Oral  SpO2: 95% 92% 97% 90%  Weight:      Height:        Intake/Output Summary (Last 24 hours) at 01/27/2021 1741 Last data filed at 01/27/2021 1700 Gross per 24 hour  Intake 360 ml  Output --  Net 360 ml   Last 3 Weights 01/19/2021 01/12/2021 01/11/2021  Weight (lbs) 107 lb 110 lb 3.7 oz 110 lb 10.7 oz  Weight (kg) 48.535 kg 50 kg 50.2 kg     Body mass index is 24.87 kg/m.  General:  very frail, elderly female, in no acute distress on HFNC HEENT: normal for  Neck: no JVD Vascular: No carotid bruits; Distal pulses 2+ bilaterally Cardiac:  normal S1, S2; Irreg R&R; 2/6 murmur  Lungs:  Bilateral rales, no wheezing, rhonchi or rales  Abd: soft, nontender, no hepatomegaly  Ext: no edema Musculoskeletal:  No deformities, BUE and BLE strength extremely weak but equal Skin: warm and dry  Neuro:  CNs 2-12 intact, no focal abnormalities noted Psych:  Normal affect    EKG:  The EKG was personally reviewed and demonstrates:  atrial fib, HR 73, QRS duration 154 ms, prev 152 ms, LBBB is old Telemetry:  Telemetry was personally reviewed and demonstrates:  atrial fib, LBBB  Relevant CV Studies:  ECHO: 01/25/2021  1. Left ventricular ejection fraction, by estimation, is 65 to 70%. The  left ventricle has normal function. The left ventricle has no regional  wall motion abnormalities. Left ventricular diastolic function could not  be evaluated. There is the  interventricular septum is flattened in systole and diastole, consistent  with right ventricular pressure and volume overload.   2. Right ventricular systolic function is moderately reduced. The right  ventricular size is severely enlarged. There is moderately elevated  pulmonary artery systolic pressure. The estimated right ventricular  systolic pressure is 81.4 mmHg.   3. Left atrial size was severely dilated.   4. Right atrial size was severely dilated.   5. Moderate pleural effusion in the left lateral region.   6. The  mitral valve is degenerative. Trivial mitral valve regurgitation.  No evidence of mitral stenosis. Moderate mitral annular calcification.   7. Tricuspid valve regurgitation is moderate.   8. The aortic valve is tricuspid. There is mild calcification of the  aortic valve. Aortic valve regurgitation is trivial. Aortic valve  sclerosis is present, with no evidence of aortic valve stenosis.   9. The inferior vena cava is normal in size with <50% respiratory  variability, suggesting right atrial pressure of 8 mmHg.   Laboratory Data:  High Sensitivity Troponin:   Recent Labs  Lab 01/06/21 1025 01/06/21 1200 01/19/21 1355 01/19/21 1653 01/27/21 1553  TROPONINIHS 23* 27* 22* 18* 16     Chemistry Recent Labs  Lab 01/22/21 1011 01/24/21 0054 01/26/21 0049  NA 138 137 139  K 4.2 3.9 3.8  CL 107 104 107  CO2 26 26 28   GLUCOSE 98 83 89  BUN 15 15 16   CREATININE 0.79 0.71  0.92  CALCIUM 8.5* 8.3* 8.3*  GFRNONAA >60 >60 59*  ANIONGAP 5 7 4*    No results for input(s): PROT, ALBUMIN, AST, ALT, ALKPHOS, BILITOT in the last 168 hours. Lipids No results for input(s): CHOL, TRIG, HDL, LABVLDL, LDLCALC, CHOLHDL in the last 168 hours.  Hematology Recent Labs  Lab 01/23/21 0106 01/24/21 0054 01/26/21 0049  WBC 17.5* 15.9* 13.6*  RBC 2.97* 3.31* 2.92*  HGB 8.6* 9.7* 8.6*  HCT 27.9* 31.2* 27.7*  MCV 93.9 94.3 94.9  MCH 29.0 29.3 29.5  MCHC 30.8 31.1 31.0  RDW 15.4 15.0 15.3  PLT 232 241 254   Thyroid No results for input(s): TSH, FREET4 in the last 168 hours.  BNP Recent Labs  Lab 01/24/21 0956  BNP 273.6*    DDimer No results for input(s): DDIMER in the last 168 hours. Lab Results  Component Value Date   HGBA1C 5.5 10/10/2017     Radiology/Studies:  VAS [place] UPPER EXTREMITY VENOUS DUPLEX  Result Date: 01/26/2021 UPPER VENOUS STUDY  Patient Name:  KEMAYA DORNER  Date of Exam:   01/26/2021 Medical Rec #: [id number]      Accession #:    [id number] Date of Birth: 02-04-1928      Patient Gender: F Patient Age:   33 years Exam Location:  [facility] Procedure:      VAS [place] UPPER EXTREMITY VENOUS DUPLEX Referring Phys: Frederich Chick LAMA --------------------------------------------------------------------------------  Indications: Edema Limitations: Edema and poor ultrasound/tissue interface. Comparison Study: No prior study Performing Technologist: Sharion Dove RVS  Examination Guidelines: A complete evaluation includes B-mode imaging, spectral Doppler, color Doppler, and power Doppler as needed of all accessible portions of each vessel. Bilateral testing is considered an integral part of a complete examination. Limited examinations for reoccurring indications may be performed as noted.  Right Findings: +----------+------------+---------+-----------+----------+--------------+ RIGHT     CompressiblePhasicitySpontaneousProperties   Summary      +----------+------------+---------+-----------+----------+--------------+ IJV           Full       Yes       Yes                             +----------+------------+---------+-----------+----------+--------------+ Subclavian    Full       Yes       Yes                             +----------+------------+---------+-----------+----------+--------------+ Axillary  Yes       Yes                             +----------+------------+---------+-----------+----------+--------------+ Brachial      Full       Yes       Yes                             +----------+------------+---------+-----------+----------+--------------+ Radial        Full                                                 +----------+------------+---------+-----------+----------+--------------+ Ulnar         Full                                                 +----------+------------+---------+-----------+----------+--------------+ Cephalic      Full                                                 +----------+------------+---------+-----------+----------+--------------+ Basilic                                             Not visualized +----------+------------+---------+-----------+----------+--------------+  Left Findings: +----------+------------+---------+-----------+----------+-------+ LEFT      CompressiblePhasicitySpontaneousPropertiesSummary +----------+------------+---------+-----------+----------+-------+ Subclavian               Yes       Yes                      +----------+------------+---------+-----------+----------+-------+  Summary:  Right: No evidence of deep vein thrombosis in the upper extremity. No evidence of superficial vein thrombosis in the upper extremity.  Left: No evidence of thrombosis in the subclavian.  *See table(s) above for measurements and observations.  Diagnosing physician: Monica Martinez MD Electronically signed by Monica Martinez  MD on 01/26/2021 at 2:08:23 PM.    Final    ECHOCARDIOGRAM LIMITED  Result Date: 01/25/2021    ECHOCARDIOGRAM LIMITED REPORT   Patient Name:   Cathy [name] Date of Exam: 01/25/2021 Medical Rec #:  [id number]     Height:       55.0 in Accession #:    [id number]    Weight:       107.0 lb Date of Birth:  06-Jun-1927     BSA:          1.343 m Patient Age:    74 years      BP:           95/60 mmHg Patient Gender: F             HR:           77 bpm. Exam Location:  Inpatient Procedure: Limited Color Doppler, Cardiac Doppler and Limited Echo Indications:    Acute respiratory failure with hypoxia  History:        Patient has prior  history of Echocardiogram examinations, most                 recent 11/28/2016. Arrythmias:Atrial Flutter and Atrial                 Fibrillation; Risk Factors:Hypertension and Dyslipidemia.  Sonographer:    Johny Chess RDCS Referring Phys: [place]  1. Left ventricular ejection fraction, by estimation, is 65 to 70%. The left ventricle has normal function. The left ventricle has no regional wall motion abnormalities. Left ventricular diastolic function could not be evaluated. There is the interventricular septum is flattened in systole and diastole, consistent with right ventricular pressure and volume overload.  2. Right ventricular systolic function is moderately reduced. The right ventricular size is severely enlarged. There is moderately elevated pulmonary artery systolic pressure. The estimated right ventricular systolic pressure is 16.5 mmHg.  3. Left atrial size was severely dilated.  4. Right atrial size was severely dilated.  5. Moderate pleural effusion in the left lateral region.  6. The mitral valve is degenerative. Trivial mitral valve regurgitation. No evidence of mitral stenosis. Moderate mitral annular calcification.  7. Tricuspid valve regurgitation is moderate.  8. The aortic valve is tricuspid. There is mild calcification of the aortic valve.  Aortic valve regurgitation is trivial. Aortic valve sclerosis is present, with no evidence of aortic valve stenosis.  9. The inferior vena cava is normal in size with <50% respiratory variability, suggesting right atrial pressure of 8 mmHg. FINDINGS  Left Ventricle: Left ventricular ejection fraction, by estimation, is 65 to 70%. The left ventricle has normal function. The left ventricle has no regional wall motion abnormalities. The left ventricular internal cavity size was normal in size. There is  no left ventricular hypertrophy. The interventricular septum is flattened in systole and diastole, consistent with right ventricular pressure and volume overload. Left ventricular diastolic function could not be evaluated. Left ventricular diastolic function could not be evaluated due to atrial fibrillation. Right Ventricle: The right ventricular size is severely enlarged. No increase in right ventricular wall thickness. Right ventricular systolic function is moderately reduced. There is moderately elevated pulmonary artery systolic pressure. The tricuspid regurgitant velocity is 3.07 m/s, and with an assumed right atrial pressure of 8 mmHg, the estimated right ventricular systolic pressure is 53.7 mmHg. Left Atrium: Left atrial size was severely dilated. Right Atrium: Right atrial size was severely dilated. Pericardium: Trivial pericardial effusion is present. Mitral Valve: The mitral valve is degenerative in appearance. Moderate mitral annular calcification. Trivial mitral valve regurgitation. No evidence of mitral valve stenosis. Tricuspid Valve: The tricuspid valve is grossly normal. Tricuspid valve regurgitation is moderate . No evidence of tricuspid stenosis. Aortic Valve: The aortic valve is tricuspid. There is mild calcification of the aortic valve. Aortic valve regurgitation is trivial. Aortic valve sclerosis is present, with no evidence of aortic valve stenosis. Pulmonic Valve: The pulmonic valve was grossly  normal. Pulmonic valve regurgitation is not visualized. No evidence of pulmonic stenosis. Aorta: The aortic root and ascending aorta are structurally normal, with no evidence of dilitation. Venous: The inferior vena cava is normal in size with less than 50% respiratory variability, suggesting right atrial pressure of 8 mmHg. Additional Comments: There is a moderate pleural effusion in the left lateral region. LEFT VENTRICLE PLAX 2D LVIDd:         3.50 cm LVIDs:         2.30 cm LV PW:         1.20  cm LV IVS:        1.00 cm  IVC IVC diam: 1.50 cm LEFT ATRIUM             Index LA diam:        4.30 cm 3.20 cm/m LA Vol (A2C):   85.5 ml 63.64 ml/m LA Vol (A4C):   90.2 ml 67.14 ml/m LA Biplane Vol: 97.7 ml 72.73 ml/m  AORTIC VALVE LVOT Vmax:   124.00 cm/s LVOT Vmean:  92.600 cm/s LVOT VTI:    0.233 m  AORTA Ao Asc diam: 3.50 cm TRICUSPID VALVE TR Peak grad:   37.7 mmHg TR Vmax:        307.00 cm/s  SHUNTS Systemic VTI: 0.23 m Eleonore Chiquito MD Electronically signed by Eleonore Chiquito MD Signature Date/Time: 01/25/2021/5:15:48 PM    Final      Assessment and Plan:   Abnl ECG -Although the morphology for her complex is a little different, it is still atrial for both an underlying left bundle which is old. -She is asymptomatic from an ischemic standpoint - Medical condition is tenuous at best and she is appropriately a DNR/DNI - Although her mental status is improved today, her her O2 sats not improved and are between 90 and 94% on 20 L of HFNC -There is no need to cycle cardiac enzymes, recheck echo cardiogram or do other procedures. -No further evaluation is indicated in this situation -Agree with Palliative Care seeing her and considering Comfort Care if her condition does not improve soon   Risk Assessment/Risk Scores:       New York Heart Association (NYHA) Functional Class NYHA Class IV  CHA2DS2-VASc Score = 6   This indicates a 9.7% annual risk of stroke. The patient's score is based upon: CHF  History: 1 HTN History: 1 Diabetes History: 0 Stroke History: 0 Vascular Disease History: 1 Age Score: 2 Gender Score: 1    For questions or updates, please contact [place] Please consult [email or web address] for contact info under    Signed, Rosaria Ferries, PA-C  01/27/2021 5:41 PM

## 2021-01-28 DIAGNOSIS — J9621 Acute and chronic respiratory failure with hypoxia: Secondary | ICD-10-CM | POA: Diagnosis not present

## 2021-01-28 MED ORDER — IPRATROPIUM-ALBUTEROL 0.5-2.5 (3) MG/3ML IN SOLN
3.0000 mL | Freq: Three times a day (TID) | RESPIRATORY_TRACT | Status: DC
  Administered 2021-01-28 – 2021-02-01 (×13): 3 mL via RESPIRATORY_TRACT
  Filled 2021-01-28 (×12): qty 3

## 2021-01-28 MED ORDER — GERHARDT'S BUTT CREAM
1.0000 "application " | TOPICAL_CREAM | Freq: Every day | CUTANEOUS | Status: DC
  Administered 2021-01-29 – 2021-02-06 (×9): 1 via TOPICAL
  Filled 2021-01-28 (×3): qty 1

## 2021-01-28 NOTE — Progress Notes (Signed)
Progress Note  Patient Name: Cathy [name] Date of Encounter: 01/28/2021  [facility] Cardiologist: Belva Crome III, MD   Subjective   Sitting up on high flow [place]. Eating serial. Pleasantly demented  Vitals High Flow 20 L [place] Normal vitals Leukocytosis Hgb stable 8.6 Crt normal  Inpatient Medications    Scheduled Meds:  amiodarone  200 mg Oral Daily   apixaban  2.5 mg Oral BID   atorvastatin  20 mg Oral q1800   darifenacin  7.5 mg Oral Daily   fluticasone  2 spray Each Nare BID   guaiFENesin  600 mg Oral BID   ipratropium-albuterol  3 mL Nebulization Q6H   isosorbide mononitrate  60 mg Oral Daily   levothyroxine  62.5 mcg Oral Q0600   pantoprazole  40 mg Oral Daily   polyethylene glycol  17 g Oral Daily   saccharomyces boulardii  250 mg Oral BID   Continuous Infusions:  PRN Meds: acetaminophen, guaiFENesin-dextromethorphan, ipratropium-albuterol   Vital Signs    Vitals:   01/27/21 1931 01/27/21 2315 01/28/21 0300 01/28/21 0315  BP:  (!) 108/50 (!) 108/50 115/75  Pulse:  72 72 69  Resp:  20 20 19   Temp:  98 F (36.7 C)  97.8 F (36.6 C)  TempSrc:  Oral  Oral  SpO2: 93% 94% 94% 94%  Weight:      Height:        Intake/Output Summary (Last 24 hours) at 01/28/2021 0729 Last data filed at 01/27/2021 2200 Gross per 24 hour  Intake 480 ml  Output --  Net 480 ml   Last 3 Weights 01/19/2021 01/12/2021 01/11/2021  Weight (lbs) 107 lb 110 lb 3.7 oz 110 lb 10.7 oz  Weight (kg) 48.535 kg 50 kg 50.2 kg      Telemetry    Atrial fibrillation with non specific intraventricular conduction delay, rate controlled - Personally Reviewed  ECG    NA - Personally Reviewed  Physical Exam   Vitals:   01/28/21 0300 01/28/21 0315  BP: (!) 108/50 115/75  Pulse: 72 69  Resp: 20 19  Temp:  97.8 F (36.6 C)  SpO2: 94% 94%      GEN: frail, elderly 87 y/o F Neck: No JVD Cardiac: irregular rate, no murmurs, rubs, or gallops.  Respiratory: Clear to  auscultation bilaterally. GI: Soft, nontender, non-distended  MS: No edema; No deformity. Neuro:  Nonfocal  Skin: warm and well perfused Psych: Normal affect   Labs    High Sensitivity Troponin:   Recent Labs  Lab 01/06/21 1025 01/06/21 1200 01/19/21 1355 01/19/21 1653 01/27/21 1553  TROPONINIHS 23* 27* 22* 18* 16     Chemistry Recent Labs  Lab 01/22/21 1011 01/24/21 0054 01/26/21 0049  NA 138 137 139  K 4.2 3.9 3.8  CL 107 104 107  CO2 26 26 28   GLUCOSE 98 83 89  BUN 15 15 16   CREATININE 0.79 0.71 0.92  CALCIUM 8.5* 8.3* 8.3*  GFRNONAA >60 >60 59*  ANIONGAP 5 7 4*    Lipids No results for input(s): CHOL, TRIG, HDL, LABVLDL, LDLCALC, CHOLHDL in the last 168 hours.  Hematology Recent Labs  Lab 01/23/21 0106 01/24/21 0054 01/26/21 0049  WBC 17.5* 15.9* 13.6*  RBC 2.97* 3.31* 2.92*  HGB 8.6* 9.7* 8.6*  HCT 27.9* 31.2* 27.7*  MCV 93.9 94.3 94.9  MCH 29.0 29.3 29.5  MCHC 30.8 31.1 31.0  RDW 15.4 15.0 15.3  PLT 232 241 254   Thyroid No  results for input(s): TSH, FREET4 in the last 168 hours.  BNP Recent Labs  Lab 01/24/21 0956  BNP 273.6*    DDimer No results for input(s): DDIMER in the last 168 hours.   Radiology    VAS [place] UPPER EXTREMITY VENOUS DUPLEX  Result Date: 01/26/2021 UPPER VENOUS STUDY  Patient Name:  Cathy [name]  Date of Exam:   01/26/2021 Medical Rec #: [id number]      Accession #:    [id number] Date of Birth: 07-22-1927      Patient Gender: F Patient Age:   11 years Exam Location:  [facility] Procedure:      VAS [place] UPPER EXTREMITY VENOUS DUPLEX Referring Phys: Frederich Chick LAMA --------------------------------------------------------------------------------  Indications: Edema Limitations: Edema and poor ultrasound/tissue interface. Comparison Study: No prior study Performing Technologist: Sharion Dove RVS  Examination Guidelines: A complete evaluation includes B-mode imaging, spectral Doppler, color Doppler, and power Doppler as needed  of all accessible portions of each vessel. Bilateral testing is considered an integral part of a complete examination. Limited examinations for reoccurring indications may be performed as noted.  Right Findings: +----------+------------+---------+-----------+----------+--------------+ RIGHT     CompressiblePhasicitySpontaneousProperties   Summary     +----------+------------+---------+-----------+----------+--------------+ IJV           Full       Yes       Yes                             +----------+------------+---------+-----------+----------+--------------+ Subclavian    Full       Yes       Yes                             +----------+------------+---------+-----------+----------+--------------+ Axillary                 Yes       Yes                             +----------+------------+---------+-----------+----------+--------------+ Brachial      Full       Yes       Yes                             +----------+------------+---------+-----------+----------+--------------+ Radial        Full                                                 +----------+------------+---------+-----------+----------+--------------+ Ulnar         Full                                                 +----------+------------+---------+-----------+----------+--------------+ Cephalic      Full                                                 +----------+------------+---------+-----------+----------+--------------+ Basilic  Not visualized +----------+------------+---------+-----------+----------+--------------+  Left Findings: +----------+------------+---------+-----------+----------+-------+ LEFT      CompressiblePhasicitySpontaneousPropertiesSummary +----------+------------+---------+-----------+----------+-------+ Subclavian               Yes       Yes                       +----------+------------+---------+-----------+----------+-------+  Summary:  Right: No evidence of deep vein thrombosis in the upper extremity. No evidence of superficial vein thrombosis in the upper extremity.  Left: No evidence of thrombosis in the subclavian.  *See table(s) above for measurements and observations.  Diagnosing physician: Monica Martinez MD Electronically signed by Monica Martinez MD on 01/26/2021 at 2:08:23 PM.    Final     Cardiac Studies   TTE 01/25/2021 1. Left ventricular ejection fraction, by estimation, is 65 to 70%. The  left ventricle has normal function. The left ventricle has no regional  wall motion abnormalities. Left ventricular diastolic function could not  be evaluated. There is the  interventricular septum is flattened in systole and diastole, consistent  with right ventricular pressure and volume overload.   2. Right ventricular systolic function is moderately reduced. The right  ventricular size is severely enlarged. There is moderately elevated  pulmonary artery systolic pressure. The estimated right ventricular  systolic pressure is 73.2 mmHg.   3. Left atrial size was severely dilated.   4. Right atrial size was severely dilated.   5. Moderate pleural effusion in the left lateral region.   6. The mitral valve is degenerative. Trivial mitral valve regurgitation.  No evidence of mitral stenosis. Moderate mitral annular calcification.   7. Tricuspid valve regurgitation is moderate.   8. The aortic valve is tricuspid. There is mild calcification of the  aortic valve. Aortic valve regurgitation is trivial. Aortic valve  sclerosis is present, with no evidence of aortic valve stenosis.   9. The inferior vena cava is normal in size with <50% respiratory  variability, suggesting right atrial pressure of 8 mmHg.   Patient Profile     87 y.o. female Cathy [name] is a 87 y.o. female with a hx of CHF with recovered EF (EF previously 25-30%, improved to  55-60% by echo in 07/2015), PAFlutter (s/p DCCV in 03/2015, on Eliquis and amio), HTN, HLD, venous insuff, OA (uses a walker) and chronic anemia, who is being seen 01/27/2021 for the evaluation of CHF at the request of Dr Darrick Meigs. Patient had STE III, aVF noted on tele and cardiology was called overnight, not deemed a STEMI  Assessment & Plan    #STE: She does not have clinical features of a STEMI. She is comfortable today. Trop was negative. Further she is DNR and high risk for any procedure. - no recommendations for ischemic work up - cont statin  #CHF: s/p IV lasix. Has normal LV function - euvolemic - hgb stable  #HTN - cont imdur  #Atrial Fibrillation: elevated stroke risk - continue eliquis 2.5 mg BID  - continue amiodarone   Patient has had prolonged stay with multifocal PNA. She is on 20 L high flow. She is undergoing palliative discussions and is DNR.    [facility] will sign off.   Medication Recommendations:  continue per above Other recommendations (labs, testing, etc):  none Follow up as an outpatient:  Palliative, possibly comfort measure  For questions or updates, please contact [place] Please consult [email or web address] for contact info under        Signed, Janina Mayo,  MD  01/28/2021, 7:29 AM

## 2021-01-28 NOTE — TOC Progression Note (Signed)
Transition of Care [facility]) - Progression Note    Patient Details  Name: Cathy [name] MRN: [id number] Date of Birth: January 08, 1928  [facility] CM/SW [place], [place] Phone Number: 01/28/2021, 4:35 PM  Clinical Narrative:    CSW continuing to follow, pt not medically ready for DC.   Expected Discharge Plan: [place] Barriers to Discharge: Continued Medical Work up  Expected Discharge Plan and Services Expected Discharge Plan: [place] In-house Referral: Clinical Social Work   Post Acute Care Choice: [place] Living arrangements for the past 2 months: [place], [place]                                       Social Determinants of Health (SDOH) Interventions    Readmission Risk Interventions No flowsheet data found.

## 2021-01-28 NOTE — Progress Notes (Signed)
Progress Note    Cathy [name]  HKV:[id number] DOB: 04/22/1927  DOA: 01/19/2021 PCP: Biagio Borg, MD    Brief Narrative:     Medical records reviewed and are as summarized below:  [name] is an 87 y.o. female with medical history of hypertension, dyslipidemia, heart failure, paroxysmal atrial flutter, hiatal hernia, osteoarthritis presented with cough and dyspnea.  She had recent hospitalization from 01/06/2021 to 01/12/2021 for acute hypoxemic respiratory failure due to pneumonia and rhinovirus infection.  She received antibiotic therapy and was discharged to skilled nursing facility on supplemental oxygen 3 L/min. At facility she continued to have cough and was requiring 4 L of oxygen, O2 sats was 88%.  She was brought to the ED.  Chest x-ray showed new interstitial/alveolar infiltrate right upper lobe and left lower lobe.  She has continued to worsen and O2 needs increased to 20L.  Poor overall prognosis.     Assessment/Plan:   Principal Problem:   Acute on chronic respiratory failure with hypoxemia (HCC) Active Problems:   Normocytic anemia   Prolonged Q-T interval on ECG   Atrial fibrillation and flutter (HCC)   Hypothyroidism   Heart failure with preserved ejection fraction (HCC)   Aspiration pneumonia (HCC)   Acute hypoxemic respiratory failure initially thought to be due to multifocal pneumonia, right upper lobe and left lower lobe -Swallow evaluation shows no aspiration  -Patient was treated with 7 days of IV Unasyn -Continue bronchodilator therapy -Chest x-ray shows persistent infiltrates on right side, also possible pulmonary edema -She received Lasix 40 mg IV on 11/26 without improvement -Pulmonology was consulted; patient continues to decline as she is not participating in therapies recommended by pulmonology -palliative care consulted for  discussion of goals of care- still wants full treatment -OOB, aggressive pulmonary toiletry   ST elevation:   -cards consult:  She does not have clinical features of a STEMI. She is comfortable today. Trop was negative. Further she is DNR and high risk for any procedure. - no recommendations for ischemic work up - cont statin  Acute on chronic diastolic heart failure -BNP on admission was 220 -Patient received IV Lasix with no significant improvement on 11/26  Paroxysmal atrial flutter -Rate controlled; continue amiodarone -Continue apixaban for anticoagulation   Hypertension -Continue isosorbide -Blood pressure is stable   Hypothyroidism -Continue Synthroid   Dyslipidemia -Continue atorvastatin   Hypovolemic hyponatremia -Resolved   Anemia- unclear etiology -Hemoglobin trending downward -No overt signs of bleeding -FOBT ordered but STILL Not done-- last hospitalization it was negative   Right upper extremity edema -Venous duplex: negative for DVT    Goals of care -Palliative care has seen-- patient wishes to continue with treatment but I suspect she will further decline    Family Communication/Anticipated D/C date and plan/Code Status   DVT prophylaxis: NOAC Code Status: DNR Disposition Plan: Status is: Inpatient  Remains inpatient appropriate because: still requiring a large amount of O2         Medical Consultants:   PCCM: Patient is overall very frail and unwilling to participate in certain parts of her care which is not unreasonable given her frailty and age. Recommend continue current management and recommendations as proposed yesterday. I would be in agreement with transition to comfort measures if that is what the patient would like. cards  Subjective:   Not eating much per nursing, down to 15L  Objective:    Vitals:   01/28/21 0300 01/28/21 0315 01/28/21 0728 01/28/21 0730  BP: (!) 108/50 115/75 110/68   Pulse: 72 69 78   Resp: 20 19 17    Temp:  97.8 F (36.6 C) 98 F (36.7 C)   TempSrc:  Oral Oral   SpO2: 94% 94% 94% 94%  Weight:       Height:        Intake/Output Summary (Last 24 hours) at 01/28/2021 2878 Last data filed at 01/28/2021 6767 Gross per 24 hour  Intake 600 ml  Output --  Net 600 ml   Filed Weights   01/19/21 1333  Weight: 48.5 kg    Exam:  General: Appearance:    Elderly female in no acute distress     Lungs:     Slightly increased work of breathing, diminished, weak cough  Heart:    Normal heart rate.   MS:   All extremities are intact.    Neurologic:   Awake, alert, pleasant     Data Reviewed:   I have personally reviewed following labs and imaging studies:  Labs: Labs show the following:   Basic Metabolic Panel: Recent Labs  Lab 01/22/21 1011 01/24/21 0054 01/26/21 0049  NA 138 137 139  K 4.2 3.9 3.8  CL 107 104 107  CO2 26 26 28   GLUCOSE 98 83 89  BUN 15 15 16   CREATININE 0.79 0.71 0.92  CALCIUM 8.5* 8.3* 8.3*   GFR Estimated Creatinine Clearance: 25 mL/min (by C-G formula based on SCr of 0.92 mg/dL). Liver Function Tests: No results for input(s): AST, ALT, ALKPHOS, BILITOT, PROT, ALBUMIN in the last 168 hours. No results for input(s): LIPASE, AMYLASE in the last 168 hours. No results for input(s): AMMONIA in the last 168 hours. Coagulation profile No results for input(s): INR, PROTIME in the last 168 hours.  CBC: Recent Labs  Lab 01/22/21 1011 01/23/21 0106 01/24/21 0054 01/26/21 0049  WBC 17.3* 17.5* 15.9* 13.6*  HGB 9.6* 8.6* 9.7* 8.6*  HCT 29.7* 27.9* 31.2* 27.7*  MCV 94.0 93.9 94.3 94.9  PLT 220 232 241 254   Cardiac Enzymes: No results for input(s): CKTOTAL, CKMB, CKMBINDEX, TROPONINI in the last 168 hours. BNP (last 3 results) No results for input(s): PROBNP in the last 8760 hours. CBG: No results for input(s): GLUCAP in the last 168 hours. D-Dimer: No results for input(s): DDIMER in the last 72 hours. Hgb A1c: No results for input(s): HGBA1C in the last 72 hours. Lipid Profile: No results for input(s): CHOL, HDL, LDLCALC, TRIG, CHOLHDL,  LDLDIRECT in the last 72 hours. Thyroid function studies: No results for input(s): TSH, T4TOTAL, T3FREE, THYROIDAB in the last 72 hours.  Invalid input(s): FREET3 Anemia work up: No results for input(s): VITAMINB12, FOLATE, FERRITIN, TIBC, IRON, RETICCTPCT in the last 72 hours. Sepsis Labs: Recent Labs  Lab 01/22/21 1011 01/23/21 0106 01/24/21 0054 01/26/21 0049  WBC 17.3* 17.5* 15.9* 13.6*    Microbiology Recent Results (from the past 240 hour(s))  Culture, blood (routine x 2)     Status: None   Collection Time: 01/19/21  1:55 PM   Specimen: BLOOD RIGHT FOREARM  Result Value Ref Range Status   Specimen Description BLOOD RIGHT FOREARM  Final   Special Requests   Final    BOTTLES DRAWN AEROBIC AND ANAEROBIC Blood Culture adequate volume   Culture   Final    NO GROWTH 5 DAYS Performed at [facility], [address] [place], [place] [address]    Report Status 01/24/2021 FINAL  Final  Resp Panel by  RT-PCR (Flu A&B, Covid)     Status: None   Collection Time: 01/19/21  2:00 PM   Specimen: Nasopharyngeal(NP) swabs in vial transport medium  Result Value Ref Range Status   SARS Coronavirus 2 by RT PCR NEGATIVE NEGATIVE Final    Comment: (NOTE) SARS-CoV-2 target nucleic acids are NOT DETECTED.  The SARS-CoV-2 RNA is generally detectable in upper respiratory specimens during the acute phase of infection. The lowest concentration of SARS-CoV-2 viral copies this assay can detect is 138 copies/mL. A negative result does not preclude SARS-Cov-2 infection and should not be used as the sole basis for treatment or other patient management decisions. A negative result may occur with  improper specimen collection/handling, submission of specimen other than nasopharyngeal swab, presence of viral mutation(s) within the areas targeted by this assay, and inadequate number of viral copies(<138 copies/mL). A negative result must be combined with clinical observations, patient history,  and epidemiological information. The expected result is Negative.  Fact Sheet for Patients:  EntrepreneurPulse.com.au  Fact Sheet for Healthcare Providers:  IncredibleEmployment.be  This test is no t yet approved or cleared by the [place] FDA and  has been authorized for detection and/or diagnosis of SARS-CoV-2 by FDA under an Emergency Use Authorization (EUA). This EUA will remain  in effect (meaning this test can be used) for the duration of the COVID-19 declaration under Section 564(b)(1) of the Act, 21 U.S.C.section 360bbb-3(b)(1), unless the authorization is terminated  or revoked sooner.       Influenza A by PCR NEGATIVE NEGATIVE Final   Influenza B by PCR NEGATIVE NEGATIVE Final    Comment: (NOTE) The Xpert Xpress SARS-CoV-2/FLU/RSV plus assay is intended as an aid in the diagnosis of influenza from Nasopharyngeal swab specimens and should not be used as a sole basis for treatment. Nasal washings and aspirates are unacceptable for Xpert Xpress SARS-CoV-2/FLU/RSV testing.  Fact Sheet for Patients: EntrepreneurPulse.com.au  Fact Sheet for Healthcare Providers: IncredibleEmployment.be  This test is not yet approved or cleared by the [place] FDA and has been authorized for detection and/or diagnosis of SARS-CoV-2 by FDA under an Emergency Use Authorization (EUA). This EUA will remain in effect (meaning this test can be used) for the duration of the COVID-19 declaration under Section 564(b)(1) of the Act, 21 U.S.C. section 360bbb-3(b)(1), unless the authorization is terminated or revoked.  Performed at [facility], [place] [address], [place], [place] [address]   Culture, blood (routine x 2)     Status: None   Collection Time: 01/19/21  3:07 PM   Specimen: BLOOD  Result Value Ref Range Status   Specimen Description BLOOD LEFT ANTECUBITAL  Final   Special Requests   Final     BOTTLES DRAWN AEROBIC AND ANAEROBIC Blood Culture adequate volume   Culture   Final    NO GROWTH 5 DAYS Performed at [facility], [place] [address], [place], [place] [address]    Report Status 01/24/2021 FINAL  Final  MRSA Next Gen by PCR, Nasal     Status: None   Collection Time: 01/20/21  3:51 PM   Specimen: Nasal Mucosa; Nasal Swab  Result Value Ref Range Status   MRSA by PCR Next Gen NOT DETECTED NOT DETECTED Final    Comment: (NOTE) The GeneXpert MRSA Assay (FDA approved for NASAL specimens only), is one component of a comprehensive MRSA colonization surveillance program. It is not intended to diagnose MRSA infection nor to guide or monitor treatment for MRSA infections.  Test performance is not FDA approved in patients less than 57 years old. Performed at [facility], [place] [address], [place], [place] [address]     Procedures and diagnostic studies:  VAS [place] UPPER EXTREMITY VENOUS DUPLEX  Result Date: 01/26/2021 UPPER VENOUS STUDY  Patient Name:  LOTOYA CASELLA  Date of Exam:   01/26/2021 Medical Rec #: [id number]      Accession #:    [id number] Date of Birth: 12/18/1927      Patient Gender: F Patient Age:   13 years Exam Location:  [facility] Procedure:      VAS [place] UPPER EXTREMITY VENOUS DUPLEX Referring Phys: Frederich Chick LAMA --------------------------------------------------------------------------------  Indications: Edema Limitations: Edema and poor ultrasound/tissue interface. Comparison Study: No prior study Performing Technologist: Sharion Dove RVS  Examination Guidelines: A complete evaluation includes B-mode imaging, spectral Doppler, color Doppler, and power Doppler as needed of all accessible portions of each vessel. Bilateral testing is considered an integral part of a complete examination. Limited examinations for reoccurring indications may be performed as noted.  Right Findings: +----------+------------+---------+-----------+----------+--------------+ RIGHT      CompressiblePhasicitySpontaneousProperties   Summary     +----------+------------+---------+-----------+----------+--------------+ IJV           Full       Yes       Yes                             +----------+------------+---------+-----------+----------+--------------+ Subclavian    Full       Yes       Yes                             +----------+------------+---------+-----------+----------+--------------+ Axillary                 Yes       Yes                             +----------+------------+---------+-----------+----------+--------------+ Brachial      Full       Yes       Yes                             +----------+------------+---------+-----------+----------+--------------+ Radial        Full                                                 +----------+------------+---------+-----------+----------+--------------+ Ulnar         Full                                                 +----------+------------+---------+-----------+----------+--------------+ Cephalic      Full                                                 +----------+------------+---------+-----------+----------+--------------+ Basilic  Not visualized +----------+------------+---------+-----------+----------+--------------+  Left Findings: +----------+------------+---------+-----------+----------+-------+ LEFT      CompressiblePhasicitySpontaneousPropertiesSummary +----------+------------+---------+-----------+----------+-------+ Subclavian               Yes       Yes                      +----------+------------+---------+-----------+----------+-------+  Summary:  Right: No evidence of deep vein thrombosis in the upper extremity. No evidence of superficial vein thrombosis in the upper extremity.  Left: No evidence of thrombosis in the subclavian.  *See table(s) above for measurements and observations.  Diagnosing  physician: Monica Martinez MD Electronically signed by Monica Martinez MD on 01/26/2021 at 2:08:23 PM.    Final     Medications:    amiodarone  200 mg Oral Daily   apixaban  2.5 mg Oral BID   atorvastatin  20 mg Oral q1800   darifenacin  7.5 mg Oral Daily   fluticasone  2 spray Each Nare BID   guaiFENesin  600 mg Oral BID   ipratropium-albuterol  3 mL Nebulization TID   isosorbide mononitrate  60 mg Oral Daily   levothyroxine  62.5 mcg Oral Q0600   pantoprazole  40 mg Oral Daily   polyethylene glycol  17 g Oral Daily   saccharomyces boulardii  250 mg Oral BID   Continuous Infusions:   LOS: 9 days   Geradine Girt  [facility]   How to contact the [facility] Attending or Consulting provider [place] or covering provider during after hours [place], for this patient?  Check the care team in [facility] and look for a) attending/consulting TR[facility] provider listed and b) the [facility] team listed Log into [email or web address] and use [place]'s universal password to access. If you do not have the password, please contact the hospital operator. Locate the [facility] provider you are looking for under [facility] and page to a number that you can be directly reached. If you still have difficulty reaching the provider, please page the [facility] (Director on Call) for the Hospitalists listed on amion for assistance.  01/28/2021, 9:29 AM

## 2021-01-29 DIAGNOSIS — J9621 Acute and chronic respiratory failure with hypoxia: Secondary | ICD-10-CM | POA: Diagnosis not present

## 2021-01-29 LAB — BASIC METABOLIC PANEL
Anion gap: 7 (ref 5–15)
BUN: 11 mg/dL (ref 8–23)
CO2: 26 mmol/L (ref 22–32)
Calcium: 8.6 mg/dL — ABNORMAL LOW (ref 8.9–10.3)
Chloride: 105 mmol/L (ref 98–111)
Creatinine, Ser: 0.8 mg/dL (ref 0.44–1.00)
GFR, Estimated: >60 mL/min (ref >60)
Glucose, Bld: 88 mg/dL (ref 70–99)
Potassium: 4.2 mmol/L (ref 3.5–5.1)
Sodium: 138 mmol/L (ref 135–145)

## 2021-01-29 LAB — CBC
HCT: 30.6 % — ABNORMAL LOW (ref 36.0–46.0)
Hemoglobin: 9.6 g/dL — ABNORMAL LOW (ref 12.0–15.0)
MCH: 29.9 pg (ref 26.0–34.0)
MCHC: 31.4 g/dL (ref 30.0–36.0)
MCV: 95.3 fL (ref 80.0–100.0)
Platelets: 257 10*3/uL (ref 150–400)
RBC: 3.21 MIL/uL — ABNORMAL LOW (ref 3.87–5.11)
RDW: 15.4 % (ref 11.5–15.5)
WBC: 13 10*3/uL — ABNORMAL HIGH (ref 4.0–10.5)
nRBC: 0 % (ref 0.0–0.2)

## 2021-01-29 NOTE — Progress Notes (Signed)
Mobility Specialist Progress Note    01/29/21 1513  Mobility  Activity Transferred:  Chair to bed  Level of Assistance +2 (takes two people)  Information systems manager Ambulated (ft) 2 ft  Mobility Out of bed to chair with meals  Mobility Response Tolerated well  Mobility performed by Mobility specialist  $Mobility charge 1 Mobility   [facility] Mobility Specialist  M.S. Primary Phone: [phone] M.S. Secondary Phone: [phone]

## 2021-01-29 NOTE — Progress Notes (Signed)
Physical Therapy Treatment Patient Details Name: Cathy [name] MRN: [id number] DOB: Jun 29, 1927 Today's Date: 01/29/2021   History of Present Illness Pt is a 87 y.o. female admited from SNF on 01/19/21 with cough and dyspnea; workup for acute hypoxic respiratory failure secondary to multifocal PNA, requiring heated HFNC. Of note, recent admission 11/8-11/14/22 with PNA and rhinovirus infection with d/c to SNF for short-term rehab. PMH includes mild dementia, HTN, HF, aflutter, hernia, OA.    PT Comments    The pt was agreeable to session focused on progression of functional strength and activity tolerance. She was able to demo improved tolerance for sit-stand transfers with reduced assist (from mod-minG through session) with improved anterior translation and use of BUE to power up to standing. Additionally, the session was conducted on 10L HHFNC with SpO2 mostly >90% which demos improved tolerance from last session. The pt was agreeable to short bout of gait in the room, but did require 30 sec-19min rest after due to both SOB and generalized fatigue. Will continue to benefit from skilled PT acutely and in post-acute rehab to maximize pt recovery and independence.    Recommendations for follow up therapy are one component of a multi-disciplinary discharge planning process, led by the attending physician.  Recommendations may be updated based on patient status, additional functional criteria and insurance authorization.  Follow Up Recommendations  Skilled nursing-short term rehab (<3 hours/day)     Assistance Recommended at Discharge Frequent or constant Supervision/Assistance  Equipment Recommendations  None recommended by PT    Recommendations for Other Services       Precautions / Restrictions Precautions Precautions: Fall;Other (comment) Precaution Comments: on 10L HHFNC 35% FiO2 Restrictions Weight Bearing Restrictions: No     Mobility  Bed Mobility Overal bed mobility: Needs  Assistance Bed Mobility: Supine to Sit     Supine to sit: Min assist;HOB elevated     General bed mobility comments: minA to elevate trunk, increased time, effort and use of rails. x2 rest breaks durign process due to SOb    Transfers Overall transfer level: Needs assistance Equipment used: Rolling walker (2 wheels) Transfers: Sit to/from Stand;Bed to chair/wheelchair/BSC Sit to Stand: Min assist;Min guard     Step pivot transfers: Min assist     General transfer comment: progressed from minA with VC each rep for sit-stand to minG with no need for repeated cues for hand placement. x6 from recliner. able to step to recliner with RW for BUE support    Ambulation/Gait Ambulation/Gait assistance: Min assist Gait Distance (Feet): 3 Feet Assistive device: Rolling walker (2 wheels) Gait Pattern/deviations: Step-to pattern;Shuffle;Trunk flexed;Narrow base of support Gait velocity: reduced Gait velocity interpretation: <1.31 ft/sec, indicative of household ambulator   General Gait Details: 3-5 small shuffle steps with RW from EOB to recliner 2-3 ft away      Balance Overall balance assessment: Needs assistance Sitting-balance support: Feet supported Sitting balance-Leahy Scale: Fair     Standing balance support: Bilateral upper extremity supported;During functional activity;Single extremity supported Standing balance-Leahy Scale: Poor Standing balance comment: Reliant on UE support through RW to maintain balance                            Cognition Arousal/Alertness: Awake/alert Behavior During Therapy: WFL for tasks assessed/performed;Flat affect Overall Cognitive Status: History of cognitive impairments - at baseline  General Comments: H/o dementia per chart. Pt following simple commands appropriately; able to read clock and verbalized understanding of waiting 30 min in recliner. endorses significant fatigue         Exercises Other Exercises Other Exercises: sit-stand from recliner x6    General Comments General comments (skin integrity, edema, etc.): SpO2 low of 86% on 10L HHFNC with repeated sit-stand transfers, but recovered to 90s with 30sec-19min seated rest. then maintained 90-95% through rest of session      Pertinent Vitals/Pain Pain Assessment: No/denies pain Pain Intervention(s): Monitored during session     PT Goals (current goals can now be found in the care plan section) Acute Rehab PT Goals Patient Stated Goal: agreeable to return to facility PT Goal Formulation: With patient Time For Goal Achievement: 02/06/21 Potential to Achieve Goals: Fair Progress towards PT goals: Progressing toward goals    Frequency    Min 2X/week      PT Plan Current plan remains appropriate       AM-PAC PT "6 Clicks" Mobility   Outcome Measure  Help needed turning from your back to your side while in a flat bed without using bedrails?: A Little Help needed moving from lying on your back to sitting on the side of a flat bed without using bedrails?: A Little Help needed moving to and from a bed to a chair (including a wheelchair)?: A Little Help needed standing up from a chair using your arms (e.g., wheelchair or bedside chair)?: A Little Help needed to walk in hospital room?: Total Help needed climbing 3-5 steps with a railing? : Total 6 Click Score: 14    End of Session Equipment Utilized During Treatment: Oxygen;Gait belt Activity Tolerance: Patient limited by fatigue Patient left: in chair;with call bell/phone within reach;with chair alarm set Nurse Communication: Mobility status PT Visit Diagnosis: Muscle weakness (generalized) (M62.81);Difficulty in walking, not elsewhere classified (R26.2)     Time: 0947-0962 PT Time Calculation (min) (ACUTE ONLY): 24 min  Charges:  $Therapeutic Exercise: 8-22 mins $Therapeutic Activity: 8-22 mins                     West Carbo, PT,  DPT   Acute Rehabilitation Department Pager #: [phone]   Sandra Cockayne 01/29/2021, 12:10 PM

## 2021-01-29 NOTE — Progress Notes (Signed)
Progress Note    Cathy [name]  WUJ:[id number] DOB: [birth year]  DOA: 01/19/2021 PCP: Biagio Borg, MD    Brief Narrative:     Medical records reviewed and are as summarized below:  Cathy [name] is an 87 y.o. female with medical history of hypertension, dyslipidemia, heart failure, paroxysmal atrial flutter, hiatal hernia, osteoarthritis presented with cough and dyspnea.  She had recent hospitalization from 01/06/2021 to 01/12/2021 for acute hypoxemic respiratory failure due to pneumonia and rhinovirus infection.  She received antibiotic therapy and was discharged to skilled nursing facility on supplemental oxygen 3 L/min. At facility she continued to have cough and was requiring 4 L of oxygen, O2 sats was 88%.  She was brought to the ED.  Chest x-ray showed new interstitial/alveolar infiltrate right upper lobe and left lower lobe.  She has continued to worsen and O2 needs increased to 20L.  Poor overall prognosis but currently being weaned off O2.     Assessment/Plan:   Principal Problem:   Acute on chronic respiratory failure with hypoxemia (HCC) Active Problems:   Normocytic anemia   Prolonged Q-T interval on ECG   Atrial fibrillation and flutter (HCC)   Hypothyroidism   Heart failure with preserved ejection fraction (HCC)   Aspiration pneumonia (HCC)   Acute hypoxemic respiratory failure initially thought to be due to multifocal pneumonia, right upper lobe and left lower lobe -Swallow evaluation shows no aspiration  -Patient was treated with 7 days of IV Unasyn -Continue bronchodilator therapy -Chest x-ray shows persistent infiltrates on right side, also possible pulmonary edema -She received Lasix 40 mg IV on 11/26 without improvement -Pulmonology was consulted- supported comfort care if patient wanted to pursue -palliative care consulted for  discussion of goals of care- still wants full treatment -OOB, aggressive pulmonary toiletry   ST elevation:  -cards  consult:  She does not have clinical features of a STEMI. She is comfortable today. Trop was negative. Further she is DNR and high risk for any procedure. - no recommendations for ischemic work up - cont statin  Acute on chronic diastolic heart failure -BNP on admission was 220 -Patient received IV Lasix with no significant improvement on 11/26  Paroxysmal atrial flutter -Rate controlled; continue amiodarone -Continue apixaban for anticoagulation   Hypertension -Continue isosorbide -Blood pressure is stable   Hypothyroidism -Continue Synthroid   Dyslipidemia -Continue atorvastatin   Hypovolemic hyponatremia -Resolved   Anemia- unclear etiology -Hemoglobin trending downward -No overt signs of bleeding -FOBT ordered but STILL Not done-- last hospitalization it was negative   Right upper extremity edema -Venous duplex: negative for DVT    Goals of care -Palliative care has seen-- patient wishes to continue with treatment -- would recommend tranition to comfort if patient declines    Family Communication/Anticipated D/C date and plan/Code Status   DVT prophylaxis: NOAC Code Status: DNR Disposition Plan: Status is: Inpatient  Remains inpatient appropriate because: still requiring a large amount of O2         Medical Consultants:   PCCM: Patient is overall very frail and unwilling to participate in certain parts of her care which is not unreasonable given her frailty and age. Recommend continue current management and recommendations as proposed yesterday. I would be in agreement with transition to comfort measures if that is what the patient would like. cards  Subjective:   Says she has never eaten much and is not concerned with how much she is eating  Objective:  Vitals:   01/29/21 0743 01/29/21 0833 01/29/21 1123 01/29/21 1329  BP: 132/85  (!) 119/56   Pulse: 80  61 64  Resp: 15  19 (!) 22  Temp: 98.1 F (36.7 C)  (!) 97.5 F (36.4 C)   TempSrc:  Oral  Oral   SpO2: 92% 96% 93% 95%  Weight:      Height:        Intake/Output Summary (Last 24 hours) at 01/29/2021 1523 Last data filed at 01/29/2021 1100 Gross per 24 hour  Intake 240 ml  Output --  Net 240 ml   Filed Weights   01/19/21 1333  Weight: 48.5 kg    Exam:  General: Appearance:    Elderly/frail female in no acute distress     Lungs:     respirations unlabored  Heart:    Normal heart rate.  MS:   All extremities are intact.    Neurologic:   Awake, alert, pleasant and cooperative    Data Reviewed:   I have personally reviewed following labs and imaging studies:  Labs: Labs show the following:   Basic Metabolic Panel: Recent Labs  Lab 01/24/21 0054 01/26/21 0049 01/29/21 0120  NA 137 139 138  K 3.9 3.8 4.2  CL 104 107 105  CO2 26 28 26   GLUCOSE 83 89 88  BUN 15 16 11   CREATININE 0.71 0.92 0.80  CALCIUM 8.3* 8.3* 8.6*   GFR Estimated Creatinine Clearance: 28.8 mL/min (by C-G formula based on SCr of 0.8 mg/dL). Liver Function Tests: No results for input(s): AST, ALT, ALKPHOS, BILITOT, PROT, ALBUMIN in the last 168 hours. No results for input(s): LIPASE, AMYLASE in the last 168 hours. No results for input(s): AMMONIA in the last 168 hours. Coagulation profile No results for input(s): INR, PROTIME in the last 168 hours.  CBC: Recent Labs  Lab 01/23/21 0106 01/24/21 0054 01/26/21 0049 01/29/21 0120  WBC 17.5* 15.9* 13.6* 13.0*  HGB 8.6* 9.7* 8.6* 9.6*  HCT 27.9* 31.2* 27.7* 30.6*  MCV 93.9 94.3 94.9 95.3  PLT 232 241 254 257   Cardiac Enzymes: No results for input(s): CKTOTAL, CKMB, CKMBINDEX, TROPONINI in the last 168 hours. BNP (last 3 results) No results for input(s): PROBNP in the last 8760 hours. CBG: No results for input(s): GLUCAP in the last 168 hours. D-Dimer: No results for input(s): DDIMER in the last 72 hours. Hgb A1c: No results for input(s): HGBA1C in the last 72 hours. Lipid Profile: No results for input(s): CHOL,  HDL, LDLCALC, TRIG, CHOLHDL, LDLDIRECT in the last 72 hours. Thyroid function studies: No results for input(s): TSH, T4TOTAL, T3FREE, THYROIDAB in the last 72 hours.  Invalid input(s): FREET3 Anemia work up: No results for input(s): VITAMINB12, FOLATE, FERRITIN, TIBC, IRON, RETICCTPCT in the last 72 hours. Sepsis Labs: Recent Labs  Lab 01/23/21 0106 01/24/21 0054 01/26/21 0049 01/29/21 0120  WBC 17.5* 15.9* 13.6* 13.0*    Microbiology Recent Results (from the past 240 hour(s))  MRSA Next Gen by PCR, Nasal     Status: None   Collection Time: 01/20/21  3:51 PM   Specimen: Nasal Mucosa; Nasal Swab  Result Value Ref Range Status   MRSA by PCR Next Gen NOT DETECTED NOT DETECTED Final    Comment: (NOTE) The GeneXpert MRSA Assay (FDA approved for NASAL specimens only), is one component of a comprehensive MRSA colonization surveillance program. It is not intended to diagnose MRSA infection nor to guide or monitor treatment for MRSA infections. Test performance is  not FDA approved in patients less than 35 years old. Performed at [facility], [place] [address], [place], [place] [address]     Procedures and diagnostic studies:  No results found.  Medications:    amiodarone  200 mg Oral Daily   apixaban  2.5 mg Oral BID   atorvastatin  20 mg Oral q1800   darifenacin  7.5 mg Oral Daily   fluticasone  2 spray Each Nare BID   Gerhardt's butt cream  1 application Topical Daily   guaiFENesin  600 mg Oral BID   ipratropium-albuterol  3 mL Nebulization TID   isosorbide mononitrate  60 mg Oral Daily   levothyroxine  62.5 mcg Oral Q0600   pantoprazole  40 mg Oral Daily   polyethylene glycol  17 g Oral Daily   saccharomyces boulardii  250 mg Oral BID   Continuous Infusions:   LOS: 10 days   Geradine Girt  [facility]   How to contact the [facility] Attending or Consulting provider [place] or covering provider during after hours [place], for this patient?  Check the care  team in [facility] and look for a) attending/consulting TRH provider listed and b) the [facility] team listed Log into [email or web address] and use [place]'s universal password to access. If you do not have the password, please contact the hospital operator. Locate the [facility] provider you are looking for under [facility] and page to a number that you can be directly reached. If you still have difficulty reaching the provider, please page the [facility] (Director on Call) for the Hospitalists listed on amion for assistance.  01/29/2021, 3:23 PM

## 2021-01-30 DIAGNOSIS — J9621 Acute and chronic respiratory failure with hypoxia: Secondary | ICD-10-CM | POA: Diagnosis not present

## 2021-01-30 MED ORDER — FLUCONAZOLE 150 MG PO TABS
150.0000 mg | ORAL_TABLET | Freq: Once | ORAL | Status: AC
  Administered 2021-01-30: 150 mg via ORAL
  Filled 2021-01-30: qty 1

## 2021-01-30 MED ORDER — BISACODYL 10 MG RE SUPP
10.0000 mg | Freq: Once | RECTAL | Status: AC
  Administered 2021-01-30: 10 mg via RECTAL
  Filled 2021-01-30: qty 1

## 2021-01-30 MED ORDER — SALINE SPRAY 0.65 % NA SOLN
1.0000 | NASAL | Status: DC | PRN
  Administered 2021-01-30: 1 via NASAL
  Filled 2021-01-30: qty 44

## 2021-01-30 NOTE — TOC Progression Note (Signed)
Transition of Care [facility]) - Progression Note    Patient Details  Name: Cathy [name] MRN: [id number] Date of Birth: 11-Oct-1927  Transition of [facility] CM/SW [place], [place] Phone Number: 01/30/2021, 1:28 PM  Clinical Narrative:    CSW continuing to follow pt not medically stable for DC.   Expected Discharge Plan: [place] Barriers to Discharge: Continued Medical Work up  Expected Discharge Plan and Services Expected Discharge Plan: [place] In-house Referral: Clinical Social Work   Post Acute Care Choice: [place] Living arrangements for the past 2 months: [place], [place]                                       Social Determinants of Health (SDOH) Interventions    Readmission Risk Interventions No flowsheet data found.

## 2021-01-30 NOTE — Care Management Important Message (Signed)
Important Message  Patient Details  Name: Cathy [name] MRN: [id number] Date of Birth: April 04, 1927   Medicare Important Message Given:  Yes     Shelda Altes 01/30/2021, 1:00 PM

## 2021-01-30 NOTE — Plan of Care (Signed)

## 2021-01-30 NOTE — Plan of Care (Signed)
  Problem: Health Behavior/Discharge Planning: Goal: Ability to manage health-related needs will improve Outcome: Not Progressing   Problem: Clinical Measurements: Goal: Ability to maintain clinical measurements within normal limits will improve Outcome: Not Progressing Goal: Respiratory complications will improve Outcome: Not Progressing   Problem: Activity: Goal: Risk for activity intolerance will decrease Outcome: Not Progressing

## 2021-01-30 NOTE — Progress Notes (Signed)
Progress Note    Cathy [name]  [id number] DOB: 03/23/1927  DOA: 01/19/2021 PCP: Biagio Borg, MD    Brief Narrative:     Medical records reviewed and are as summarized below:  Cathy [name] is an 87 y.o. female with medical history of hypertension, dyslipidemia, heart failure, paroxysmal atrial flutter, hiatal hernia, osteoarthritis presented with cough and dyspnea.  She had recent hospitalization from 01/06/2021 to 01/12/2021 for acute hypoxemic respiratory failure due to pneumonia and rhinovirus infection.  She received antibiotic therapy and was discharged to skilled nursing facility on supplemental oxygen 3 L/min. At facility she continued to have cough and was requiring 4 L of oxygen, O2 sats was 88%.  She was brought to the ED.  Chest x-ray showed new interstitial/alveolar infiltrate right upper lobe and left lower lobe.  She has continued to worsen and O2 needs increased to 20L.  Poor overall prognosis but currently being weaned off O2.     Assessment/Plan:   Principal Problem:   Acute on chronic respiratory failure with hypoxemia (HCC) Active Problems:   Normocytic anemia   Prolonged Q-T interval on ECG   Atrial fibrillation and flutter (HCC)   Hypothyroidism   Heart failure with preserved ejection fraction (HCC)   Aspiration pneumonia (HCC)   Acute hypoxemic respiratory failure initially thought to be due to multifocal pneumonia, right upper lobe and left lower lobe -Swallow evaluation shows no aspiration  -Patient was treated with 7 days of IV Unasyn -Continue bronchodilator therapy -Chest x-ray showed persistent infiltrates on right side, also possible pulmonary edema -She received Lasix 40 mg IV on 11/26 without improvement -Pulmonology was consulted- supported comfort care if patient wanted to pursue -palliative care consulted for  discussion of goals of care- still wants full treatment -OOB, aggressive pulmonary toiletry   ST elevation:  -cards  consult:  She does not have clinical features of a STEMI. She is comfortable today. Trop was negative. Further she is DNR and high risk for any procedure. - no recommendations for ischemic work up - cont statin  Acute on chronic diastolic heart failure -BNP on admission was 220 -Patient received IV Lasix with no significant improvement on 11/26  Paroxysmal atrial flutter -Rate controlled; continue amiodarone -Continue apixaban for anticoagulation   Hypertension -Continue isosorbide -Blood pressure is stable   Hypothyroidism -Continue Synthroid   Dyslipidemia -Continue atorvastatin   Hypovolemic hyponatremia -Resolved   Anemia- unclear etiology -Hemoglobin stable   Right upper extremity edema -Venous duplex: negative for DVT    Goals of care -Palliative care has seen-- patient wishes to continue with treatment -- would recommend transition to comfort if patient declines    Family Communication/Anticipated D/C date and plan/Code Status   DVT prophylaxis: NOAC Code Status: DNR Disposition Plan: Status is: Inpatient LM for daughter 12/1 Remains inpatient appropriate because: still requiring a large amount of O2         Medical Consultants:   PCCM: Patient is overall very frail and unwilling to participate in certain parts of her care which is not unreasonable given her frailty and age. Recommend continue current management and recommendations as proposed yesterday. I would be in agreement with transition to comfort measures if that is what the patient would like. cards  Subjective:   Sleepy-- did not sleep well last PM  Objective:    Vitals:   01/30/21 0350 01/30/21 0743 01/30/21 0931 01/30/21 1141  BP: 105/63 (!) 116/55  116/60  Pulse: (!) 54 63  65  Resp: 19 20  20   Temp: 97.7 F (36.5 C) (!) 97.4 F (36.3 C)  (!) 97.5 F (36.4 C)  TempSrc: Oral Oral  Oral  SpO2: 100% 97% 96% 93%  Weight:      Height:       No intake or output data in the 24  hours ending 01/30/21 1247  Filed Weights   01/19/21 1333  Weight: 48.5 kg    Exam:   General: Appearance:    Frail/elderly female in no acute distress     Lungs:     respirations unlabored  Heart:    Normal heart rate.   MS:   All extremities are intact.    Neurologic:   Awake, alert, oriented x 3.      Data Reviewed:   I have personally reviewed following labs and imaging studies:  Labs: Labs show the following:   Basic Metabolic Panel: Recent Labs  Lab 01/24/21 0054 01/26/21 0049 01/29/21 0120  NA 137 139 138  K 3.9 3.8 4.2  CL 104 107 105  CO2 26 28 26   GLUCOSE 83 89 88  BUN 15 16 11   CREATININE 0.71 0.92 0.80  CALCIUM 8.3* 8.3* 8.6*   GFR Estimated Creatinine Clearance: 28.8 mL/min (by C-G formula based on SCr of 0.8 mg/dL). Liver Function Tests: No results for input(s): AST, ALT, ALKPHOS, BILITOT, PROT, ALBUMIN in the last 168 hours. No results for input(s): LIPASE, AMYLASE in the last 168 hours. No results for input(s): AMMONIA in the last 168 hours. Coagulation profile No results for input(s): INR, PROTIME in the last 168 hours.  CBC: Recent Labs  Lab 01/24/21 0054 01/26/21 0049 01/29/21 0120  WBC 15.9* 13.6* 13.0*  HGB 9.7* 8.6* 9.6*  HCT 31.2* 27.7* 30.6*  MCV 94.3 94.9 95.3  PLT 241 254 257   Cardiac Enzymes: No results for input(s): CKTOTAL, CKMB, CKMBINDEX, TROPONINI in the last 168 hours. BNP (last 3 results) No results for input(s): PROBNP in the last 8760 hours. CBG: No results for input(s): GLUCAP in the last 168 hours. D-Dimer: No results for input(s): DDIMER in the last 72 hours. Hgb A1c: No results for input(s): HGBA1C in the last 72 hours. Lipid Profile: No results for input(s): CHOL, HDL, LDLCALC, TRIG, CHOLHDL, LDLDIRECT in the last 72 hours. Thyroid function studies: No results for input(s): TSH, T4TOTAL, T3FREE, THYROIDAB in the last 72 hours.  Invalid input(s): FREET3 Anemia work up: No results for input(s):  VITAMINB12, FOLATE, FERRITIN, TIBC, IRON, RETICCTPCT in the last 72 hours. Sepsis Labs: Recent Labs  Lab 01/24/21 0054 01/26/21 0049 01/29/21 0120  WBC 15.9* 13.6* 13.0*    Microbiology Recent Results (from the past 240 hour(s))  MRSA Next Gen by PCR, Nasal     Status: None   Collection Time: 01/20/21  3:51 PM   Specimen: Nasal Mucosa; Nasal Swab  Result Value Ref Range Status   MRSA by PCR Next Gen NOT DETECTED NOT DETECTED Final    Comment: (NOTE) The GeneXpert MRSA Assay (FDA approved for NASAL specimens only), is one component of a comprehensive MRSA colonization surveillance program. It is not intended to diagnose MRSA infection nor to guide or monitor treatment for MRSA infections. Test performance is not FDA approved in patients less than 61 years old. Performed at [facility], [place] [address] [place], [place] [address]     Procedures and diagnostic studies:  No results found.  Medications:    amiodarone  200 mg Oral  Daily   apixaban  2.5 mg Oral BID   atorvastatin  20 mg Oral q1800   bisacodyl  10 mg Rectal Once   darifenacin  7.5 mg Oral Daily   fluticasone  2 spray Each Nare BID   Gerhardt's butt cream  1 application Topical Daily   guaiFENesin  600 mg Oral BID   ipratropium-albuterol  3 mL Nebulization TID   isosorbide mononitrate  60 mg Oral Daily   levothyroxine  62.5 mcg Oral Q0600   pantoprazole  40 mg Oral Daily   polyethylene glycol  17 g Oral Daily   saccharomyces boulardii  250 mg Oral BID   Continuous Infusions:   LOS: 11 days   Geradine Girt  [facility]   How to contact the [facility] Attending or Consulting provider [place] or covering provider during after hours [place], for this patient?  Check the care team in [facility] and look for a) attending/consulting TRH provider listed and b) the [facility] team listed Log into [email or web address] and use [place]'s universal password to access. If you do not have the password, please contact the  hospital operator. Locate the [facility] provider you are looking for under [facility] and page to a number that you can be directly reached. If you still have difficulty reaching the provider, please page the [facility] (Director on Call) for the Hospitalists listed on amion for assistance.  01/30/2021, 12:47 PM

## 2021-01-31 DIAGNOSIS — Z515 Encounter for palliative care: Secondary | ICD-10-CM | POA: Diagnosis not present

## 2021-01-31 DIAGNOSIS — R0902 Hypoxemia: Secondary | ICD-10-CM | POA: Diagnosis not present

## 2021-01-31 DIAGNOSIS — J9621 Acute and chronic respiratory failure with hypoxia: Secondary | ICD-10-CM | POA: Diagnosis not present

## 2021-01-31 DIAGNOSIS — Z7189 Other specified counseling: Secondary | ICD-10-CM | POA: Diagnosis not present

## 2021-01-31 LAB — BASIC METABOLIC PANEL
Anion gap: 4 — ABNORMAL LOW (ref 5–15)
BUN: 9 mg/dL (ref 8–23)
CO2: 28 mmol/L (ref 22–32)
Calcium: 8.4 mg/dL — ABNORMAL LOW (ref 8.9–10.3)
Chloride: 103 mmol/L (ref 98–111)
Creatinine, Ser: 0.91 mg/dL (ref 0.44–1.00)
GFR, Estimated: 60 mL/min — ABNORMAL LOW (ref >60)
Glucose, Bld: 76 mg/dL (ref 70–99)
Potassium: 4.2 mmol/L (ref 3.5–5.1)
Sodium: 135 mmol/L (ref 135–145)

## 2021-01-31 LAB — CBC
HCT: 29.7 % — ABNORMAL LOW (ref 36.0–46.0)
Hemoglobin: 9.4 g/dL — ABNORMAL LOW (ref 12.0–15.0)
MCH: 29.5 pg (ref 26.0–34.0)
MCHC: 31.6 g/dL (ref 30.0–36.0)
MCV: 93.1 fL (ref 80.0–100.0)
Platelets: 260 10*3/uL (ref 150–400)
RBC: 3.19 MIL/uL — ABNORMAL LOW (ref 3.87–5.11)
RDW: 15.6 % — ABNORMAL HIGH (ref 11.5–15.5)
WBC: 14.2 10*3/uL — ABNORMAL HIGH (ref 4.0–10.5)
nRBC: 0 % (ref 0.0–0.2)

## 2021-01-31 MED ORDER — FLUCONAZOLE 100 MG PO TABS
100.0000 mg | ORAL_TABLET | Freq: Every day | ORAL | Status: DC
  Administered 2021-01-31: 100 mg via ORAL
  Filled 2021-01-31 (×2): qty 1

## 2021-01-31 MED ORDER — HYDROCORTISONE 1 % EX CREA
TOPICAL_CREAM | Freq: Four times a day (QID) | CUTANEOUS | Status: DC
  Filled 2021-01-31: qty 28

## 2021-01-31 NOTE — Progress Notes (Signed)
Progress Note    BRIDGET Flushing  NFA:[id number] DOB: 22-Jul-1927  DOA: 01/19/2021 PCP: Biagio Borg, MD    Brief Narrative:     Medical records reviewed and are as summarized below:  Cathy [name] is an 87 y.o. female with medical history of hypertension, dyslipidemia, heart failure, paroxysmal atrial flutter, hiatal hernia, osteoarthritis presented with cough and dyspnea.  She had recent hospitalization from 01/06/2021 to 01/12/2021 for acute hypoxemic respiratory failure due to pneumonia and rhinovirus infection.  She received antibiotic therapy and was discharged to skilled nursing facility on supplemental oxygen 3 L/min. At facility she continued to have cough and was requiring 4 L of oxygen, O2 sats was 88%.  She was brought to the ED.  Chest x-ray showed new interstitial/alveolar infiltrate right upper lobe and left lower lobe.  She has continued to worsen and O2 needs increased to 20L.  Poor overall prognosis but currently being weaned off O2.     Assessment/Plan:   Principal Problem:   Acute on chronic respiratory failure with hypoxemia (HCC) Active Problems:   Normocytic anemia   Prolonged Q-T interval on ECG   Atrial fibrillation and flutter (HCC)   Hypothyroidism   Heart failure with preserved ejection fraction (HCC)   Aspiration pneumonia (HCC)   Acute hypoxemic respiratory failure initially thought to be due to multifocal pneumonia, right upper lobe and left lower lobe -Swallow evaluation shows no aspiration  -treated with 7 days of IV Unasyn -Continue bronchodilator therapy -Chest x-ray showed persistent infiltrates on right side, also possible pulmonary edema but she received Lasix 40 mg IV on 11/26 without improvement -Pulmonology was consulted- supported comfort care if patient wanted to pursue -palliative care consulted for  discussion of goals of care- still wants full treatment -OOB, aggressive pulmonary toiletry   ST elevation:  -cards consult:  She  does not have clinical features of a STEMI. She is comfortable today. Trop was negative. Further she is DNR and high risk for any procedure. - no recommendations for ischemic work up - cont statin  Vaginal yeast infection - diflucan -topical meds -avoid pure wick  Acute on chronic diastolic heart failure -BNP on admission was 220 -Patient received IV Lasix with no significant improvement on 11/26  Paroxysmal atrial flutter -Rate controlled; continue amiodarone -Continue apixaban for anticoagulation   Hypertension -Continue isosorbide -Blood pressure is stable   Hypothyroidism -Continue Synthroid   Dyslipidemia -Continue atorvastatin   Hypovolemic hyponatremia -Resolved   Anemia- unclear etiology -Hemoglobin stable   Right upper extremity edema -Venous duplex: negative for DVT    Goals of care -Palliative care has seen-- patient wishes to continue with treatment -- would recommend transition to comfort if patient declines    Family Communication/Anticipated D/C date and plan/Code Status   DVT prophylaxis: NOAC Code Status: DNR Disposition Plan: Status is: Inpatient LM for daughter 12/3 Remains inpatient appropriate because: SNF Monday if still stable         Medical Consultants:   PCCM: Patient is overall very frail and unwilling to participate in certain parts of her care which is not unreasonable given her frailty and age. Recommend continue current management and recommendations as proposed yesterday. I would be in agreement with transition to comfort measures if that is what the patient would like. cards  Subjective:   Vagina is itchy  Objective:    Vitals:   01/31/21 0314 01/31/21 0321 01/31/21 0821 01/31/21 0845  BP: 134/72     Pulse:  62 61    Resp: 17 17    Temp:  (!) 97.3 F (36.3 C) (!) 97.5 F (36.4 C)   TempSrc:  Oral    SpO2: 91%   92%  Weight:      Height:        Intake/Output Summary (Last 24 hours) at 01/31/2021 1130 Last  data filed at 01/31/2021 8588 Gross per 24 hour  Intake 240 ml  Output --  Net 240 ml    Filed Weights   01/19/21 1333  Weight: 48.5 kg    Exam:   General: Appearance:    Frail female in no acute distress     Lungs:     No wheezing, diminished effort, respirations unlabored  Heart:    Normal heart rate.  MS:   All extremities are intact.    Neurologic:   Awake, alert, oriented x 3. No apparent focal neurological           defect.         Data Reviewed:   I have personally reviewed following labs and imaging studies:  Labs: Labs show the following:   Basic Metabolic Panel: Recent Labs  Lab 01/26/21 0049 01/29/21 0120 01/31/21 0045  NA 139 138 135  K 3.8 4.2 4.2  CL 107 105 103  CO2 28 26 28   GLUCOSE 89 88 76  BUN 16 11 9   CREATININE 0.92 0.80 0.91  CALCIUM 8.3* 8.6* 8.4*   GFR Estimated Creatinine Clearance: 25.3 mL/min (by C-G formula based on SCr of 0.91 mg/dL). Liver Function Tests: No results for input(s): AST, ALT, ALKPHOS, BILITOT, PROT, ALBUMIN in the last 168 hours. No results for input(s): LIPASE, AMYLASE in the last 168 hours. No results for input(s): AMMONIA in the last 168 hours. Coagulation profile No results for input(s): INR, PROTIME in the last 168 hours.  CBC: Recent Labs  Lab 01/26/21 0049 01/29/21 0120 01/31/21 0045  WBC 13.6* 13.0* 14.2*  HGB 8.6* 9.6* 9.4*  HCT 27.7* 30.6* 29.7*  MCV 94.9 95.3 93.1  PLT 254 257 260   Cardiac Enzymes: No results for input(s): CKTOTAL, CKMB, CKMBINDEX, TROPONINI in the last 168 hours. BNP (last 3 results) No results for input(s): PROBNP in the last 8760 hours. CBG: No results for input(s): GLUCAP in the last 168 hours. D-Dimer: No results for input(s): DDIMER in the last 72 hours. Hgb A1c: No results for input(s): HGBA1C in the last 72 hours. Lipid Profile: No results for input(s): CHOL, HDL, LDLCALC, TRIG, CHOLHDL, LDLDIRECT in the last 72 hours. Thyroid function studies: No results  for input(s): TSH, T4TOTAL, T3FREE, THYROIDAB in the last 72 hours.  Invalid input(s): FREET3 Anemia work up: No results for input(s): VITAMINB12, FOLATE, FERRITIN, TIBC, IRON, RETICCTPCT in the last 72 hours. Sepsis Labs: Recent Labs  Lab 01/26/21 0049 01/29/21 0120 01/31/21 0045  WBC 13.6* 13.0* 14.2*    Microbiology No results found for this or any previous visit (from the past 240 hour(s)).   Procedures and diagnostic studies:  No results found.  Medications:    amiodarone  200 mg Oral Daily   apixaban  2.5 mg Oral BID   atorvastatin  20 mg Oral q1800   darifenacin  7.5 mg Oral Daily   fluconazole  100 mg Oral Daily   fluticasone  2 spray Each Nare BID   Gerhardt's butt cream  1 application Topical Daily   guaiFENesin  600 mg Oral BID   hydrocortisone cream   Topical QID  ipratropium-albuterol  3 mL Nebulization TID   isosorbide mononitrate  60 mg Oral Daily   levothyroxine  62.5 mcg Oral Q0600   pantoprazole  40 mg Oral Daily   polyethylene glycol  17 g Oral Daily   saccharomyces boulardii  250 mg Oral BID   Continuous Infusions:   LOS: 12 days   Geradine Girt  [facility]   How to contact the [facility] Attending or Consulting provider [place] or covering provider during after hours [place], for this patient?  Check the care team in [facility] and look for a) attending/consulting TRH provider listed and b) the [facility] team listed Log into [email or web address] and use [place]'s universal password to access. If you do not have the password, please contact the hospital operator. Locate the [facility] provider you are looking for under [facility] and page to a number that you can be directly reached. If you still have difficulty reaching the provider, please page the [facility] (Director on Call) for the Hospitalists listed on amion for assistance.  01/31/2021, 11:30 AM

## 2021-01-31 NOTE — Progress Notes (Signed)
Daily Progress Note   Patient Name: Cathy [name]       Date: 01/31/2021 DOB: [birth year]  Age: 87 y.o. MRN#: [id number] Attending Physician: Geradine Girt, DO Primary Care Physician: Biagio Borg, MD Admit Date: 01/19/2021 Length of Stay: 12 days  Reason for Consultation/Follow-up: Establishing goals of care  HPI/Patient Profile:  87 y.o. female  with past medical history of hypertension, dyslipidemia, heart failure, paroxysmal atrial flutter, hiatal hernia, osteoarthritis presented with cough and dyspnea.  She had recent hospitalization from 01/06/2021 to 01/12/2021 for acute hypoxemic respiratory failure due to pneumonia and rhinovirus infection.  She received antibiotic therapy and was discharged to skilled nursing facility on supplemental oxygen 3 L/min. At facility she continued to have cough and was requiring 4 L of oxygen, O2 sats was 88%.  She was brought to the ED.  Chest x-ray showed new interstitial/alveolar infiltrate right upper lobe and left lower lobe. She was admitted on 01/19/2021 with acute hypoxemic respiratory failure, diastolic heart failure, paroxysmal A. fib, and others.    During her hospitalization she developed increasing O2 requirements up to 15 L despite diuresis.  Swallow eval 11/25 with no aspiration.  No history of lung issues.  Pulmonary was consulted for further recommendations.  Recommended out of bed greater than 30 degrees at all times as due to reflux, DuoNebs, pulmonary hygiene, encourage mobilization and chest physiotherapy.  Overall poor prognosis.   PMT was consulted for goals of care conversation.  Hospitalist spoke with daughter about consistent increasing oxygen requirements over the past several days despite antibiotics.  Currently a DNR and recommended if no improvement over the next few days, they would recommend comfort measures. However, ovr the past 2-3 days the patient has improved and is now down to 2 lpm O2 by nasal cannula.  Subjective:    Subjective: Chart Reviewed. Updates received. Patient Assessed. Created space and opportunity for patient  and family to explore thoughts and feelings regarding current medical situation.  Today's Discussion: I met with the patient at the bedside today.  She states she is not having any dyspnea, abdominal pain, nausea, vomiting.  Overall she states she feels better.  She is trying to eat but states that she typically eats only a little at home and here they are making her eat more.  I explained the necessity of improved nutrition to help with the healing process and she verbalized understanding.  She was happy that her daughter brought her a country ham biscuit from [employer] today.  I discussed that the hospital doctors starting to make plans for discharge disposition and considering skilled nursing facility.  She expressed concern that her daughter is worried that she would be discharged too soon as this is what caused her to come back to the hospital.  I informed her I would express these concerns to the primary hospital doctor.  I spoke with the patient's daughter and granddaughter over the phone.  We discussed the patient's significant improvement.  Her granddaughter asked, to oxygen she is on and I informed her she remains on 2 L/min of oxygen and her last saturation at 2:00 this afternoon was 96% on this.  They are agreeable to plans for SNF for rehab (they were told approximately 30-day stay).  Her daughter is concerned about a recurrence of her pneumonia given that she was previously discharged and had to come back for this admission for pneumonia recurrence.  I explained that chest x-ray is not routinely done with significant improvement but  that I would notify the attending.  She states "I just want to make sure it is gone and that she is fine before they let her leave."  Given her significant improvement, they want to continue full scope of care.  If there are any significant changes we can  further address goals.  However, she still remains a DNR.  Tentative plan is for possible discharge on Monday to skilled nursing facility, per hospitalist note.  Review of Systems  Respiratory:  Negative for cough and shortness of breath.   Cardiovascular:  Negative for chest pain.  Gastrointestinal:  Negative for abdominal pain, nausea and vomiting.   Objective:   Vital Signs:  BP 134/72   Pulse 61   Temp (!) 97.5 F (36.4 C)   Resp 17   Ht 4' 7"  (1.397 m)   Wt 48.5 kg   SpO2 92%   BMI 24.87 kg/m   Physical Exam: Physical Exam Vitals and nursing note reviewed.  Constitutional:      General: She is not in acute distress.    Comments: Appears frail, weak  HENT:     Head: Normocephalic and atraumatic.  Cardiovascular:     Rate and Rhythm: Normal rate and regular rhythm.  Pulmonary:     Effort: Pulmonary effort is normal. No respiratory distress.     Breath sounds: No wheezing or rhonchi.  Abdominal:     General: Abdomen is flat.     Palpations: Abdomen is soft.  Skin:    General: Skin is warm and dry.  Neurological:     Mental Status: She is alert.  Psychiatric:        Mood and Affect: Mood normal.        Behavior: Behavior normal.    Palliative Assessment/Data: 50%   Assessment & Plan:   Impression: Present on Admission: . Acute on chronic respiratory failure with hypoxemia ([name]) . Prolonged Q-T interval on ECG . Hypothyroidism . Normocytic anemia . Atrial fibrillation and flutter ([name]) . Heart failure with preserved ejection fraction ([name]) . Aspiration pneumonia [name]  87 year old female with chronic comorbidities as described above and recent hospitalization for double pneumonia and rhinovirus.  She was transferred to a skilled nursing facility where she had some desaturation episodes and was admitted for respiratory failure with hypoxemia, acute on chronic diastolic congestive heart failure, A. fib.  She has had increasing oxygen requirements from 10 L  to 15 L to 20 L, high flow nasal cannula.  The patient was made a DNR over the weekend and recommended follow for 24 to 48 hours and if no improvement may consider comfort care.  Earlier in the week she did appear quite frail and lethargic.  I had a frank discussion with the patient's daughter who understood the severity of her illness.  We agreed to take it a day at a time for any signs of improvement.  Overall goal is to get better and get home, but she is realistic that this may not be possible. However, she has since had significant improvement, appears better, more alert, down to 2 lpm nasal cannula  SUMMARY OF RECOMMENDATIONS   Remain DNR Continue current course of treatment Continue significant pulmonary interventions for improvement Take it a day at a time, treat the treatable Goals are clear We will follow peripherally for now for any changes Please let us know if there's a change or need to see this patient again  Code Status: DNR  Prognosis: Unable to determine  Discharge  Planning: To Be Determined  Discussed with: medical team, nursing team, patient, patient's family  Thank you for allowing [place] to participate in the care of KATRIANA DORTCH PMT will continue to support holistically.  Time Total: 50 min  Visit consisted of counseling and education dealing with the complex and emotionally intense issues of symptom management and palliative care in the setting of serious and potentially life-threatening illness. Greater than 50%  of this time was spent counseling and coordinating care related to the above assessment and plan.  Walden Field, NP Palliative Medicine Team  Team Phone # [phone] (Nights/Weekends)  10/28/2020, 8:17 AM

## 2021-02-01 ENCOUNTER — Inpatient Hospital Stay (HOSPITAL_COMMUNITY): Payer: Medicare Other

## 2021-02-01 DIAGNOSIS — J9621 Acute and chronic respiratory failure with hypoxia: Secondary | ICD-10-CM | POA: Diagnosis not present

## 2021-02-01 MED ORDER — IPRATROPIUM-ALBUTEROL 0.5-2.5 (3) MG/3ML IN SOLN
3.0000 mL | Freq: Two times a day (BID) | RESPIRATORY_TRACT | Status: DC
  Administered 2021-02-01 – 2021-02-04 (×6): 3 mL via RESPIRATORY_TRACT
  Filled 2021-02-01 (×6): qty 3

## 2021-02-01 MED ORDER — SODIUM CHLORIDE 0.9% FLUSH
3.0000 mL | INTRAVENOUS | Status: DC | PRN
  Administered 2021-02-02: 3 mL via INTRAVENOUS

## 2021-02-01 MED ORDER — FLUCONAZOLE 50 MG PO TABS
50.0000 mg | ORAL_TABLET | Freq: Every day | ORAL | Status: AC
  Administered 2021-02-01 – 2021-02-04 (×4): 50 mg via ORAL
  Filled 2021-02-01 (×5): qty 1

## 2021-02-01 MED ORDER — SODIUM CHLORIDE 0.9% FLUSH
3.0000 mL | Freq: Two times a day (BID) | INTRAVENOUS | Status: DC
  Administered 2021-02-01 – 2021-02-06 (×12): 3 mL via INTRAVENOUS

## 2021-02-01 MED ORDER — FUROSEMIDE 10 MG/ML IJ SOLN
20.0000 mg | Freq: Every day | INTRAMUSCULAR | Status: DC
  Administered 2021-02-01 – 2021-02-02 (×2): 20 mg via INTRAVENOUS
  Filled 2021-02-01 (×2): qty 2

## 2021-02-01 NOTE — Progress Notes (Signed)
Progress Note    Cathy [name]  XIP:[id number] DOB: 1928-01-20  DOA: 01/19/2021 PCP: Biagio Borg, MD    Brief Narrative:     Medical records reviewed and are as summarized below:  Cathy [name] is an 87 y.o. female with medical history of hypertension, dyslipidemia, heart failure, paroxysmal atrial flutter, hiatal hernia, osteoarthritis presented with cough and dyspnea.  She had recent hospitalization from 01/06/2021 to 01/12/2021 for acute hypoxemic respiratory failure due to pneumonia and rhinovirus infection.  She received antibiotic therapy and was discharged to skilled nursing facility on supplemental oxygen 3 L/min. At facility she continued to have cough and was requiring 4 L of oxygen, O2 sats was 88%.  She was brought to the ED.  Chest x-ray showed new interstitial/alveolar infiltrate right upper lobe and left lower lobe.  She has continued to worsen and O2 needs increased to 20L.  Poor overall prognosis but currently being weaned off O2.     Assessment/Plan:   Principal Problem:   Acute on chronic respiratory failure with hypoxemia (HCC) Active Problems:   Normocytic anemia   Prolonged Q-T interval on ECG   Atrial fibrillation and flutter (HCC)   Hypothyroidism   Heart failure with preserved ejection fraction (HCC)   Aspiration pneumonia (HCC)   Acute hypoxemic respiratory failure initially thought to be due to multifocal pneumonia, right upper lobe and left lower lobe -Swallow evaluation shows no aspiration  -treated with 7 days of IV Unasyn -Continue bronchodilator therapy -Chest x-ray showed persistent infiltrates on right side, also possible pulmonary edema but she received Lasix 40 mg IV on 11/26 without improvement -Pulmonology was consulted- supported comfort care if patient wanted to pursue -palliative care consulted for  discussion of goals of care- still wants full treatment -OOB, aggressive pulmonary toiletry -repeat x ray on 12/4 shows ? Fluid so  will do trial of IV lasix x 1   ST elevation:  -cards consult:  She does not have clinical features of a STEMI. She is comfortable today. Trop was negative. Further she is DNR and high risk for any procedure. - no recommendations for ischemic work up - cont statin  Vaginal yeast infection - diflucan -topical meds -improved on 12/4 -avoid pure wick  Acute on chronic diastolic heart failure -BNP on admission was 220 -IV lasix x 1  Paroxysmal atrial flutter -Rate controlled; continue amiodarone -Continue apixaban for anticoagulation   Hypertension -Continue isosorbide -Blood pressure is stable   Hypothyroidism -Continue Synthroid   Dyslipidemia -Continue atorvastatin   Hypovolemic hyponatremia -Resolved   Anemia- unclear etiology -Hemoglobin stable   Right upper extremity edema -Venous duplex: negative for DVT    Goals of care -Palliative care has seen-- patient wishes to continue with treatment -- would recommend transition to comfort if patient declines    Family Communication/Anticipated D/C date and plan/Code Status   DVT prophylaxis: NOAC Code Status: DNR Disposition Plan: Status is: Inpatient Spoke with daughter 12/4 Remains inpatient appropriate because: SNF Monday/tuesday         Medical Consultants:   PCCM: Patient is overall very frail and unwilling to participate in certain parts of her care which is not unreasonable given her frailty and age. Recommend continue current management and recommendations as proposed yesterday. I would be in agreement with transition to comfort measures if that is what the patient would like. cards  Subjective:   Breathing better, did not sleep well last PM  Objective:    Vitals:  02/01/21 0340 02/01/21 0749 02/01/21 0757 02/01/21 1121  BP: 131/64 124/72  127/64  Pulse: 64 70  62  Resp: 20 18  20   Temp: 98.1 F (36.7 C) 97.6 F (36.4 C)  (!) 97.4 F (36.3 C)  TempSrc: Oral Oral  Oral  SpO2: 94% 92%  95% 92%  Weight:      Height:        Intake/Output Summary (Last 24 hours) at 02/01/2021 1210 Last data filed at 02/01/2021 0138 Gross per 24 hour  Intake 623 ml  Output --  Net 623 ml    Filed Weights   01/19/21 1333  Weight: 48.5 kg    Exam:   General: Appearance:    elderly female in no acute distress     Lungs:     On 2L [place], respirations unlabored  Heart:    Normal heart rate.   MS:   All extremities are intact. Right arm slightly swollen   Neurologic:   Awake, alert          Data Reviewed:   I have personally reviewed following labs and imaging studies:  Labs: Labs show the following:   Basic Metabolic Panel: Recent Labs  Lab 01/26/21 0049 01/29/21 0120 01/31/21 0045  NA 139 138 135  K 3.8 4.2 4.2  CL 107 105 103  CO2 28 26 28   GLUCOSE 89 88 76  BUN 16 11 9   CREATININE 0.92 0.80 0.91  CALCIUM 8.3* 8.6* 8.4*   GFR Estimated Creatinine Clearance: 25.3 mL/min (by C-G formula based on SCr of 0.91 mg/dL). Liver Function Tests: No results for input(s): AST, ALT, ALKPHOS, BILITOT, PROT, ALBUMIN in the last 168 hours. No results for input(s): LIPASE, AMYLASE in the last 168 hours. No results for input(s): AMMONIA in the last 168 hours. Coagulation profile No results for input(s): INR, PROTIME in the last 168 hours.  CBC: Recent Labs  Lab 01/26/21 0049 01/29/21 0120 01/31/21 0045  WBC 13.6* 13.0* 14.2*  HGB 8.6* 9.6* 9.4*  HCT 27.7* 30.6* 29.7*  MCV 94.9 95.3 93.1  PLT 254 257 260   Cardiac Enzymes: No results for input(s): CKTOTAL, CKMB, CKMBINDEX, TROPONINI in the last 168 hours. BNP (last 3 results) No results for input(s): PROBNP in the last 8760 hours. CBG: No results for input(s): GLUCAP in the last 168 hours. D-Dimer: No results for input(s): DDIMER in the last 72 hours. Hgb A1c: No results for input(s): HGBA1C in the last 72 hours. Lipid Profile: No results for input(s): CHOL, HDL, LDLCALC, TRIG, CHOLHDL, LDLDIRECT in the last 72  hours. Thyroid function studies: No results for input(s): TSH, T4TOTAL, T3FREE, THYROIDAB in the last 72 hours.  Invalid input(s): FREET3 Anemia work up: No results for input(s): VITAMINB12, FOLATE, FERRITIN, TIBC, IRON, RETICCTPCT in the last 72 hours. Sepsis Labs: Recent Labs  Lab 01/26/21 0049 01/29/21 0120 01/31/21 0045  WBC 13.6* 13.0* 14.2*    Microbiology No results found for this or any previous visit (from the past 240 hour(s)).   Procedures and diagnostic studies:  DG CHEST PORT 1 VIEW  Result Date: 02/01/2021 CLINICAL DATA:  Dyspnea EXAM: PORTABLE CHEST 1 VIEW COMPARISON:  Chest x-rays dated 01/23/2021 and 01/19/2021. FINDINGS: Increased opacities throughout the RIGHT lung, particularly peripheral, most likely related to increased pleural effusion. Additional patchy interstitial and alveolar opacities are stable bilaterally. Additional dense opacity at the LEFT lung base is stable. Heart size and mediastinal contours are grossly stable. No pneumothorax is seen. IMPRESSION: 1. Suspect increased  RIGHT pleural effusion causing increased opacity of the RIGHT hemithorax. 2. Stable patchy interstitial and alveolar opacities bilaterally, edema versus multifocal pneumonia. 3. Cardiomegaly. Electronically Signed   By: Franki Cabot M.D.   On: 02/01/2021 08:14    Medications:    amiodarone  200 mg Oral Daily   apixaban  2.5 mg Oral BID   atorvastatin  20 mg Oral q1800   darifenacin  7.5 mg Oral Daily   fluconazole  50 mg Oral Daily   fluticasone  2 spray Each Nare BID   furosemide  20 mg Intravenous Daily   Gerhardt's butt cream  1 application Topical Daily   guaiFENesin  600 mg Oral BID   hydrocortisone cream   Topical QID   ipratropium-albuterol  3 mL Nebulization TID   isosorbide mononitrate  60 mg Oral Daily   levothyroxine  62.5 mcg Oral Q0600   pantoprazole  40 mg Oral Daily   polyethylene glycol  17 g Oral Daily   saccharomyces boulardii  250 mg Oral BID   sodium  chloride flush  3 mL Intravenous Q12H   Continuous Infusions:   LOS: 13 days   Geradine Girt  [facility]   How to contact the [facility] Attending or Consulting provider [place] or covering provider during after hours [place], for this patient?  Check the care team in [facility] and look for a) attending/consulting TRH provider listed and b) the [facility] team listed Log into [email or web address] and use [place]'s universal password to access. If you do not have the password, please contact the hospital operator. Locate the [facility] provider you are looking for under [facility] and page to a number that you can be directly reached. If you still have difficulty reaching the provider, please page the [facility] (Director on Call) for the Hospitalists listed on amion for assistance.  02/01/2021, 12:10 PM

## 2021-02-01 NOTE — Plan of Care (Signed)
  Problem: Education: Goal: Knowledge of General Education information will improve Description: Including pain rating scale, medication(s)/side effects and non-pharmacologic comfort measures 02/01/2021 2105 by Shanon Ace, RN Outcome: Progressing 02/01/2021 2100 by Shanon Ace, RN Outcome: Progressing   Problem: Health Behavior/Discharge Planning: Goal: Ability to manage health-related needs will improve 02/01/2021 2105 by Shanon Ace, RN Outcome: Progressing 02/01/2021 2100 by Shanon Ace, RN Outcome: Progressing   Problem: Clinical Measurements: Goal: Ability to maintain clinical measurements within normal limits will improve 02/01/2021 2105 by Shanon Ace, RN Outcome: Progressing 02/01/2021 2100 by Shanon Ace, RN Outcome: Progressing Goal: Will remain free from infection 02/01/2021 2105 by Shanon Ace, RN Outcome: Progressing 02/01/2021 2100 by Shanon Ace, RN Outcome: Progressing Goal: Diagnostic test results will improve 02/01/2021 2105 by Shanon Ace, RN Outcome: Progressing 02/01/2021 2100 by Shanon Ace, RN Outcome: Progressing Goal: Respiratory complications will improve 02/01/2021 2105 by Shanon Ace, RN Outcome: Progressing 02/01/2021 2100 by Shanon Ace, RN Outcome: Progressing Goal: Cardiovascular complication will be avoided 02/01/2021 2105 by Shanon Ace, RN Outcome: Progressing 02/01/2021 2100 by Shanon Ace, RN Outcome: Progressing   Problem: Activity: Goal: Risk for activity intolerance will decrease 02/01/2021 2105 by Shanon Ace, RN Outcome: Progressing 02/01/2021 2100 by Shanon Ace, RN Outcome: Progressing   Problem: Nutrition: Goal: Adequate nutrition will be maintained 02/01/2021 2105 by Shanon Ace, RN Outcome: Progressing 02/01/2021 2100 by Shanon Ace, RN Outcome: Progressing   Problem: Coping: Goal: Level of anxiety will decrease 02/01/2021 2105 by Shanon Ace, RN Outcome: Progressing 02/01/2021 2100 by Shanon Ace, RN Outcome: Progressing   Problem: Elimination: Goal: Will not experience complications related to bowel motility 02/01/2021 2105 by Shanon Ace, RN Outcome: Progressing 02/01/2021 2100 by Shanon Ace, RN Outcome: Progressing Goal: Will not experience complications related to urinary retention 02/01/2021 2105 by Shanon Ace, RN Outcome: Progressing 02/01/2021 2100 by Shanon Ace, RN Outcome: Progressing   Problem: Pain Managment: Goal: General experience of comfort will improve 02/01/2021 2105 by Shanon Ace, RN Outcome: Progressing 02/01/2021 2100 by Shanon Ace, RN Outcome: Progressing   Problem: Safety: Goal: Ability to remain free from injury will improve 02/01/2021 2105 by Shanon Ace, RN Outcome: Progressing 02/01/2021 2100 by Shanon Ace, RN Outcome: Progressing   Problem: Skin Integrity: Goal: Risk for impaired skin integrity will decrease 02/01/2021 2105 by Shanon Ace, RN Outcome: Progressing 02/01/2021 2100 by Shanon Ace, RN Outcome: Progressing

## 2021-02-01 NOTE — Progress Notes (Signed)
PHARMACY NOTE:  ANTIMICROBIAL RENAL DOSAGE ADJUSTMENT  Current antimicrobial regimen includes a mismatch between antimicrobial dosage and estimated renal function.  As per policy approved by the Pharmacy & Therapeutics and Medical Executive Committees, the antimicrobial dosage will be adjusted accordingly.  Current antimicrobial dosage:  fluconazole 100mg  daily  Indication: vaginal yeast infection  Renal Function:  Estimated Creatinine Clearance: 25.3 mL/min (by C-G formula based on SCr of 0.91 mg/dL). []      On intermittent HD, scheduled: []      On CRRT    Antimicrobial dosage has been changed to:  50mg  daily  Additional comments:   Thank you for allowing pharmacy to be a part of this patient's care.  Einar Grad, [facility] 02/01/2021 7:19 AM

## 2021-02-01 NOTE — Plan of Care (Signed)

## 2021-02-01 NOTE — Progress Notes (Signed)
Mobility Specialist: Progress Note   02/01/21 1530  Mobility  Activity Transferred:  Bed to chair  Level of Assistance Minimal assist, patient does 75% or more  Assistive Device Front wheel walker  Distance Ambulated (ft) 2 ft  Mobility Out of bed to chair with meals  Mobility Response Tolerated well  Mobility performed by Mobility specialist  Bed Position Chair  $Mobility charge 1 Mobility   Pre-Mobility on 2 L/min [place]: 57 HR, 93% SpO2 During Mobility on 3 L/min [place]: 86-87% SpO2 Post-Mobility on 2 L/min [place]: 62 HR, 92% SpO2  Pt independent to sit EOB and required minA to stand. Pt briefly desat to 86-87% on 2 L/min but quickly recovered when O2 flow bumped up to 3 L/min. Pt is sitting in the chair with call bell in her lap. Pt back on 2 L/min [place].   [facility] Aleric Froelich Mobility Specialist Mobility Specialist Phone #1: [phone] Mobility Specialist Phone #2: [phone]

## 2021-02-02 ENCOUNTER — Inpatient Hospital Stay (HOSPITAL_COMMUNITY): Payer: Medicare Other

## 2021-02-02 DIAGNOSIS — J9621 Acute and chronic respiratory failure with hypoxia: Secondary | ICD-10-CM | POA: Diagnosis not present

## 2021-02-02 DIAGNOSIS — Z7189 Other specified counseling: Secondary | ICD-10-CM | POA: Diagnosis not present

## 2021-02-02 DIAGNOSIS — Z515 Encounter for palliative care: Secondary | ICD-10-CM | POA: Diagnosis not present

## 2021-02-02 DIAGNOSIS — R0602 Shortness of breath: Secondary | ICD-10-CM | POA: Diagnosis not present

## 2021-02-02 LAB — BASIC METABOLIC PANEL
Anion gap: 5 (ref 5–15)
BUN: 8 mg/dL (ref 8–23)
CO2: 28 mmol/L (ref 22–32)
Calcium: 8.3 mg/dL — ABNORMAL LOW (ref 8.9–10.3)
Chloride: 98 mmol/L (ref 98–111)
Creatinine, Ser: 0.83 mg/dL (ref 0.44–1.00)
GFR, Estimated: >60 mL/min (ref >60)
Glucose, Bld: 91 mg/dL (ref 70–99)
Potassium: 4 mmol/L (ref 3.5–5.1)
Sodium: 131 mmol/L — ABNORMAL LOW (ref 135–145)

## 2021-02-02 MED ORDER — IOHEXOL 9 MG/ML PO SOLN: 500.0000 mL | ORAL | Status: DC

## 2021-02-02 MED ORDER — IOHEXOL 300 MG/ML  SOLN
90.0000 mL | Freq: Once | INTRAMUSCULAR | Status: AC | PRN
  Administered 2021-02-02: 90 mL via INTRAVENOUS

## 2021-02-02 MED ORDER — IOHEXOL 9 MG/ML PO SOLN
500.0000 mL | ORAL | Status: AC
  Administered 2021-02-02: 500 mL via ORAL

## 2021-02-02 MED ORDER — KETOROLAC TROMETHAMINE 15 MG/ML IJ SOLN
15.0000 mg | Freq: Once | INTRAMUSCULAR | Status: AC
  Administered 2021-02-02: 15 mg via INTRAVENOUS
  Filled 2021-02-02: qty 1

## 2021-02-02 MED ORDER — PANTOPRAZOLE SODIUM 40 MG PO TBEC
40.0000 mg | DELAYED_RELEASE_TABLET | Freq: Two times a day (BID) | ORAL | Status: DC
  Administered 2021-02-02 – 2021-02-06 (×9): 40 mg via ORAL
  Filled 2021-02-02 (×9): qty 1

## 2021-02-02 MED ORDER — BISACODYL 10 MG RE SUPP: 10.0000 mg | Freq: Every day | RECTAL | Status: DC | PRN

## 2021-02-02 MED ORDER — POLYETHYLENE GLYCOL 3350 17 G PO PACK
17.0000 g | PACK | Freq: Two times a day (BID) | ORAL | Status: DC
  Administered 2021-02-02 – 2021-02-06 (×8): 17 g via ORAL
  Filled 2021-02-02 (×8): qty 1

## 2021-02-02 NOTE — Progress Notes (Signed)
Physical Therapy Treatment Patient Details Name: Cathy [name] MRN: [id number] DOB: 12/25/1927 Today's Date: 02/02/2021   History of Present Illness Pt is a 87 y.o. female admited from SNF on 01/19/21 with cough and dyspnea; workup for acute hypoxic respiratory failure secondary to multifocal PNA, requiring heated HFNC. Of note, recent admission 11/8-11/14/22 with PNA and rhinovirus infection with d/c to SNF for short-term rehab. PMH includes mild dementia, HTN, HF, aflutter, hernia, OA.   PT Comments    The pt continues to make slow but steady progress with OOB mobility at this time. Given reduced O2 demands, we were able to progress to ambulation in the room/hall with minA of 2 for chair follow. The pt was able to complete ~6 ft ambulation at a time prior to needing seated rest, but completed x3 bouts with encouragement. SpO2 stable on 3L for first 2 bouts of mobility, needed boost to 4L for final bout due to SpO2 88-90%. The pt will continue to benefit from skilled PT to progress endurance and stability to allow for the pt to achieve her goal of returning to independence.   Given current level of deficits and assist needed, suspect pt will make progress towards her goal but will still require assist after d/c, therefore, continue to recommend SNF for rehab at this time.     Recommendations for follow up therapy are one component of a multi-disciplinary discharge planning process, led by the attending physician.  Recommendations may be updated based on patient status, additional functional criteria and insurance authorization.  Follow Up Recommendations  Skilled nursing-short term rehab (<3 hours/day)     Assistance Recommended at Discharge Frequent or constant Supervision/Assistance  Equipment Recommendations  None recommended by PT    Recommendations for Other Services       Precautions / Restrictions Precautions Precautions: Fall;Other (comment) Precaution Comments: on 3L O2 during  session Restrictions Weight Bearing Restrictions: No     Mobility  Bed Mobility Overal bed mobility: Needs Assistance Bed Mobility: Supine to Sit     Supine to sit: Min assist     General bed mobility comments: minA to complete trunk elevation, pt able to move BLE without assist. increased time    Transfers Overall transfer level: Needs assistance Equipment used: Rolling walker (2 wheels) Transfers: Sit to/from Stand Sit to Stand: Min guard           General transfer comment: minG with significantly increased time to power up. maintains significant trunk flexion until directly cued a few times    Ambulation/Gait Ambulation/Gait assistance: Min assist;+2 safety/equipment (chair follow) Gait Distance (Feet): 6 Feet (x3 with seated rest each time) Assistive device: Rolling walker (2 wheels) Gait Pattern/deviations: Step-to pattern;Shuffle;Trunk flexed;Narrow base of support Gait velocity: reduced Gait velocity interpretation: <1.31 ft/sec, indicative of household ambulator   General Gait Details: pt able to take steps away from EOB with chair follow, SpO2 stable on 3L initially, needed 4L for last bout of 6 ft walking. 3-5 min seated rest between walking bouts. minA to direct RW and steady     Balance Overall balance assessment: Needs assistance Sitting-balance support: Feet supported Sitting balance-Leahy Scale: Fair     Standing balance support: Bilateral upper extremity supported;During functional activity;Single extremity supported Standing balance-Leahy Scale: Poor Standing balance comment: Reliant on UE support through RW to maintain balance                            Cognition Arousal/Alertness:  Awake/alert Behavior During Therapy: Flat affect Overall Cognitive Status: History of cognitive impairments - at baseline                                 General Comments: H/o dementia per chart. Pt following simple commands appropriately;  able to read clock and verbalized understanding of waiting 30 min in recliner. endorses significant fatigue        Exercises      General Comments General comments (skin integrity, edema, etc.): SpO2 to 88% on 3L after x2 bouts of walking, increased to 4L for final bout with SpO2 then in 90s. HR to 116bpm      Pertinent Vitals/Pain Pain Assessment: No/denies pain     PT Goals (current goals can now be found in the care plan section) Acute Rehab PT Goals Patient Stated Goal: agreeable to return to facility PT Goal Formulation: With patient Time For Goal Achievement: 02/06/21 Potential to Achieve Goals: Fair Progress towards PT goals: Progressing toward goals    Frequency    Min 2X/week      PT Plan Current plan remains appropriate       AM-PAC PT "6 Clicks" Mobility   Outcome Measure  Help needed turning from your back to your side while in a flat bed without using bedrails?: A Little Help needed moving from lying on your back to sitting on the side of a flat bed without using bedrails?: A Little Help needed moving to and from a bed to a chair (including a wheelchair)?: A Little Help needed standing up from a chair using your arms (e.g., wheelchair or bedside chair)?: A Little Help needed to walk in hospital room?: Total Help needed climbing 3-5 steps with a railing? : Total 6 Click Score: 14    End of Session Equipment Utilized During Treatment: Oxygen;Gait belt Activity Tolerance: Patient limited by fatigue Patient left: in chair;with call bell/phone within reach;with chair alarm set Nurse Communication: Mobility status PT Visit Diagnosis: Muscle weakness (generalized) (M62.81);Difficulty in walking, not elsewhere classified (R26.2)     Time: 0626-9485 PT Time Calculation (min) (ACUTE ONLY): 38 min  Charges:  $Gait Training: 23-37 mins $Therapeutic Activity: 8-22 mins                     [place] Carbo, PT, DPT   Acute Rehabilitation Department Pager #:  [phone]   Sandra Cockayne 02/02/2021, 1:53 PM

## 2021-02-02 NOTE — TOC Progression Note (Signed)
Transition of Care [facility]) - Progression Note    Patient Details  Name: Cathy [name] MRN: [id number] Date of Birth: 07-23-1927  [facility] CM/SW Contact  Zenon Mayo, RN Phone Number: 02/02/2021, 5:07 PM  Clinical Narrative:    NCM received consult for home hospice, NCM spoke with daughter Stanton Kidney, Palliative set up with [facility] and NCM confirmed this who family wants, and Stanton Kidney said yes, NCM made referral to [place] with [facility], she will contact the family tomorrow.   Expected Discharge Plan: [place] Barriers to Discharge: Continued Medical Work up  Expected Discharge Plan and Services Expected Discharge Plan: [place] In-house Referral: Clinical Social Work   Post Acute Care Choice: [place] Living arrangements for the past 2 months: [place], [place]                                       Social Determinants of Health (SDOH) Interventions    Readmission Risk Interventions No flowsheet data found.

## 2021-02-02 NOTE — Plan of Care (Signed)
  Problem: Clinical Measurements: Goal: Ability to maintain clinical measurements within normal limits will improve Outcome: Not Progressing Goal: Diagnostic test results will improve Outcome: Not Progressing   

## 2021-02-02 NOTE — Progress Notes (Addendum)
Progress Note    Cathy [name]  QDI:[id number] DOB: Apr 11, 1927  DOA: 01/19/2021 PCP: Biagio Borg, MD    Brief Narrative:     Medical records reviewed and are as summarized below:  Cathy [name] is an 87 y.o. female with medical history of hypertension, dyslipidemia, heart failure, paroxysmal atrial flutter, hiatal hernia, osteoarthritis presented with cough and dyspnea.  She had recent hospitalization from 01/06/2021 to 01/12/2021 for acute hypoxemic respiratory failure due to pneumonia and rhinovirus infection.  She received antibiotic therapy and was discharged to skilled nursing facility on supplemental oxygen 3 L/min. At facility she continued to have cough and was requiring 4 L of oxygen, O2 sats was 88%.  She was brought to the ED.  Chest x-ray showed new interstitial/alveolar infiltrate right upper lobe and left lower lobe.  She has continued to worsen and O2 needs increased to 20L.  Poor overall prognosis but currently being weaned off O2. Patient met with palliative care and would like full treatment course at this time.    Assessment/Plan:   Principal Problem:   Acute on chronic respiratory failure with hypoxemia (HCC) Active Problems:   Normocytic anemia   Prolonged Q-T interval on ECG   Atrial fibrillation and flutter (HCC)   Hypothyroidism   Heart failure with preserved ejection fraction (HCC)   Aspiration pneumonia (HCC)   Acute hypoxemic respiratory failure initially thought to be due to multifocal pneumonia, right upper lobe and left lower lobe -Swallow evaluation shows no aspiration - suspect her hiatal hernia is causing the aspiration  -treated with 7 days of IV Unasyn -Continue bronchodilator therapy -Pulmonology was consulted- supported comfort care if patient wanted to pursue -palliative care consulted for  discussion of goals of care- still wants full treatment -OOB, aggressive pulmonary toiletry -repeat x ray on 12/4 shows ? Fluid so will do trial  of IV lasix x 1- seemed to have increased urine output so will do daily for now and monitor labs, CT Scan shows pleural effusions  -aggressive incentive spirometry and mobility as severe atelectasis was seen on CT scan  Large hiatal hernia -chronic- suspect worsening her breathing issues  Constipation -CT Scan shows: Moderate to severe stool retention -will add more aggressive bowel regimen  ST elevation:  -cards consult:  She does not have clinical features of a STEMI. She is comfortable today. Trop was negative. Further she is DNR and high risk for any procedure. - no recommendations for ischemic work up - cont statin  Vaginal yeast infection - diflucan -improved  -Qtc stable on EKG- monitor on tele  Acute on chronic diastolic heart failure -BNP on admission was 220 -IV lasix   Paroxysmal atrial flutter -Rate controlled; continue amiodarone -Continue apixaban for anticoagulation   Hypertension -Continue isosorbide -Blood pressure is stable   Hypothyroidism -Continue Synthroid   Dyslipidemia -Continue atorvastatin   Hypovolemic hyponatremia -Resolved   Anemia- unclear etiology -Hemoglobin stable   Right upper extremity edema -Venous duplex: negative for DVT    Goals of care -Palliative care has seen-- patient wishes to continue with treatment -- I had a long discussion with patient about her hiatal hernia and the complications of this with probably chronic aspiration.  Her ultimate goal is to get back home to her level of independence in July.  I do not think she will ever be able to return to this level of living.       Family Communication/Anticipated D/C date and plan/Code Status  DVT prophylaxis: NOAC Code Status: DNR Disposition Plan: Status is: Inpatient Spoke with daughter 12/4, called again on 12/5 with no answer-- family called back and I was able to have a 3 way call with daughter and daughter in law who seemed surprised about my concerns in  regards to Ms. Demeo not recovering from this illness and/or continuing to have issues with pna/worsening O2 needs Remains inpatient appropriate because: needs continued GOCs as I suspect she will continue to wax and wane in her O2 needs and will need frequent hospitalizations as daughter wants her to "recover"         Medical Consultants:   PCCM: Patient is overall very frail and unwilling to participate in certain parts of her care which is not unreasonable given her frailty and age. Recommend continue current management and recommendations as proposed yesterday. I would be in agreement with transition to comfort measures if that is what the patient would like. cards  Subjective:   Had worsening abdominal distention and epigastric pain and CT scan was ordered  Objective:    Vitals:   02/02/21 0220 02/02/21 0315 02/02/21 0317 02/02/21 0710  BP:   120/64 131/74  Pulse:  62 69 60  Resp:  18 19 (!) 22  Temp:   (!) 97.3 F (36.3 C) 97.6 F (36.4 C)  TempSrc:   Oral Oral  SpO2: 95% 94% 95% 92%  Weight:      Height:        Intake/Output Summary (Last 24 hours) at 02/02/2021 0846 Last data filed at 02/02/2021 0300 Gross per 24 hour  Intake 3 ml  Output 180 ml  Net -177 ml    Filed Weights   01/19/21 1333  Weight: 48.5 kg    Exam:   General: Appearance:    Elderly female who appears comfortable in bed   +BS- sluggish, NT to palpation, distended but soft  Lungs:     On [place] diminished, respirations unlabored  Heart:    Normal heart rate.   MS:   All extremities are intact.    Neurologic:   Awake, alert, pleasant and cooperative-- engaging in conversation           Data Reviewed:   I have personally reviewed following labs and imaging studies:  Labs: Labs show the following:   Basic Metabolic Panel: Recent Labs  Lab 01/29/21 0120 01/31/21 0045 02/02/21 0012  NA 138 135 131*  K 4.2 4.2 4.0  CL 105 103 98  CO2 26 28 28   GLUCOSE 88 76 91  BUN 11 9 8    CREATININE 0.80 0.91 0.83  CALCIUM 8.6* 8.4* 8.3*   GFR Estimated Creatinine Clearance: 27.7 mL/min (by C-G formula based on SCr of 0.83 mg/dL). Liver Function Tests: No results for input(s): AST, ALT, ALKPHOS, BILITOT, PROT, ALBUMIN in the last 168 hours. No results for input(s): LIPASE, AMYLASE in the last 168 hours. No results for input(s): AMMONIA in the last 168 hours. Coagulation profile No results for input(s): INR, PROTIME in the last 168 hours.  CBC: Recent Labs  Lab 01/29/21 0120 01/31/21 0045  WBC 13.0* 14.2*  HGB 9.6* 9.4*  HCT 30.6* 29.7*  MCV 95.3 93.1  PLT 257 260   Cardiac Enzymes: No results for input(s): CKTOTAL, CKMB, CKMBINDEX, TROPONINI in the last 168 hours. BNP (last 3 results) No results for input(s): PROBNP in the last 8760 hours. CBG: No results for input(s): GLUCAP in the last 168 hours. D-Dimer: No results for input(s): DDIMER  in the last 72 hours. Hgb A1c: No results for input(s): HGBA1C in the last 72 hours. Lipid Profile: No results for input(s): CHOL, HDL, LDLCALC, TRIG, CHOLHDL, LDLDIRECT in the last 72 hours. Thyroid function studies: No results for input(s): TSH, T4TOTAL, T3FREE, THYROIDAB in the last 72 hours.  Invalid input(s): FREET3 Anemia work up: No results for input(s): VITAMINB12, FOLATE, FERRITIN, TIBC, IRON, RETICCTPCT in the last 72 hours. Sepsis Labs: Recent Labs  Lab 01/29/21 0120 01/31/21 0045  WBC 13.0* 14.2*    Microbiology No results found for this or any previous visit (from the past 240 hour(s)).   Procedures and diagnostic studies:  CT ABDOMEN PELVIS W CONTRAST  Result Date: 02/02/2021 CLINICAL DATA:  Abdominal pain. EXAM: CT ABDOMEN AND PELVIS WITH CONTRAST TECHNIQUE: Multidetector CT imaging of the abdomen and pelvis was performed using the standard protocol following bolus administration of intravenous contrast. CONTRAST:  84m OMNIPAQUE IOHEXOL 300 MG/ML  SOLN COMPARISON:  Abdominal and pelvis CT  with contrast 02/21/2018 and 11/27/2016. FINDINGS: Lower chest: Again noted is a large hiatal hernia with intrathoracic stomach and mid transverse colon. Large right and moderate to large left pleural effusions. There previously were moderate and small pleural effusions, respectively. There is a chronic right lower lobe collapse along side the effusion and partial collapse of left lower lobe. Increased patchy ground-glass haziness in the more anterior lung bases which could be pneumonitis or ground-glass edema. Moderate to severe cardiomegaly is increased in all chambers. Three-vessel coronary artery calcifications are again noted and heavy calcification in the mitral ring Hepatobiliary: Chronic post cholecystectomy biliary dilatation, actually is less pronounced today. The common bile duct is 8.5 mm, previously 12 mm. No liver mass is seen. Unchanged prominent liver length 19 cm. Pancreas: No mass or ductal dilatation. Spleen: Stable mild splenomegaly with innumerable low-attenuation lesions scattered throughout the substance. Largest of these is 4.0 cm posteriorly, and is not significantly changed. Some of the smaller lesions are slightly larger than previously however, but according to the report of the last CT there was no PET positivity in these. There may be a few new smaller sized lesions. Adrenals/Urinary Tract: heterogeneous left adrenal mass measures 3.4 x 2.5 cm, previously 3.0 x 1.9 cm. The right adrenal gland is normal. Renal cortical thinning is similar to prior studies with small renal cysts but no concerning lesion, calculus or hydronephrosis. No bladder thickening. Stomach/Bowel: As above, large hiatal hernia again noted with intrathoracic stomach and mid transverse colon. Again, the large intestinal wall is narrowed where it enters and exits the hernia sac but this was seen previously, without wall thickening. An appendix is not seen in this patient. Small bowel opacification and caliber are  normal. There are colonic diverticula without focal inflammatory reaction. Moderate to severe stool retention. Vascular/Lymphatic: The abdominal aorta is tortuous and moderately calcified with calcifications extending into the iliac arteries. There is no appreciable adenopathy. Other: There is interval increased body wall anasarca and mesenteric haziness. There is a small volume of abdominal and pelvic ascites but no large pockets. There is no free air or hemorrhage, no abscess. Reproductive: Uterus is absent. Numerous pelvic phleboliths. No adnexal mass. Musculoskeletal: Osteopenia, lumbar S shaped scoliosis and advanced degenerative changes of the spine are redemonstrated. Interval left hip nailing. IMPRESSION: 1. There is increased panchamber cardiomegaly and increased large right and moderate left pleural effusions. 2. There is increased body wall anasarca and mesenteric haziness, which could relate to hepatic dysfunction, malnutrition or could be congestive or from  fluid overload. 3. Chronically collapsed right lower lobe with compressive atelectasis of the remaining visible posterior lungs and ground-glass opacities in the more anterior lung bases which could be pneumonitis or ground-glass edema. 4. Small amount of ascites. 5. Chronic large hiatal hernia with intrathoracic stomach and mid transverse colon with colonic narrowing at the hernia mouth but no wall thickening. There is moderate to severe stool retention in most of the colon except for the relatively decompressed rectosigmoid segments but there is no dilated small bowel. 6. Innumerable low-density irregular lesions in the spleen. Some of the smaller lesions are larger than previously and there appear to be at least a few more smaller lesions new from prior studies. This reportedly did not show increased PET activity but a repeat PET-CT should be considered given the increase in size of some of the smaller lesions and the slight increased size of the  previously noted left adrenal mass, previously thought to be an adenoma. 7. Chronic postcholecystectomy biliary dilatation, less pronounced today than previously. 8. Aortic and coronary artery atherosclerosis. Electronically Signed   By: Telford Nab M.D.   On: 02/02/2021 06:30   DG CHEST PORT 1 VIEW  Result Date: 02/01/2021 CLINICAL DATA:  Dyspnea EXAM: PORTABLE CHEST 1 VIEW COMPARISON:  Chest x-rays dated 01/23/2021 and 01/19/2021. FINDINGS: Increased opacities throughout the RIGHT lung, particularly peripheral, most likely related to increased pleural effusion. Additional patchy interstitial and alveolar opacities are stable bilaterally. Additional dense opacity at the LEFT lung base is stable. Heart size and mediastinal contours are grossly stable. No pneumothorax is seen. IMPRESSION: 1. Suspect increased RIGHT pleural effusion causing increased opacity of the RIGHT hemithorax. 2. Stable patchy interstitial and alveolar opacities bilaterally, edema versus multifocal pneumonia. 3. Cardiomegaly. Electronically Signed   By: Franki Cabot M.D.   On: 02/01/2021 08:14    Medications:    amiodarone  200 mg Oral Daily   apixaban  2.5 mg Oral BID   atorvastatin  20 mg Oral q1800   darifenacin  7.5 mg Oral Daily   fluconazole  50 mg Oral Daily   fluticasone  2 spray Each Nare BID   furosemide  20 mg Intravenous Daily   Gerhardt's butt cream  1 application Topical Daily   guaiFENesin  600 mg Oral BID   ipratropium-albuterol  3 mL Nebulization BID   isosorbide mononitrate  60 mg Oral Daily   levothyroxine  62.5 mcg Oral Q0600   pantoprazole  40 mg Oral Daily   polyethylene glycol  17 g Oral BID   saccharomyces boulardii  250 mg Oral BID   sodium chloride flush  3 mL Intravenous Q12H   Continuous Infusions:   LOS: 14 days   Geradine Girt  [facility]   How to contact the [facility] Attending or Consulting provider [place] or covering provider during after hours [place], for this patient?   Check the care team in [facility] and look for a) attending/consulting TR[facility] provider listed and b) the [facility] team listed Log into [email or web address] and use [place]'s universal password to access. If you do not have the password, please contact the hospital operator. Locate the [facility] provider you are looking for under [facility] and page to a number that you can be directly reached. If you still have difficulty reaching the provider, please page the [facility] (Director on Call) for the Hospitalists listed on amion for assistance.  02/02/2021, 8:46 AM

## 2021-02-02 NOTE — Plan of Care (Signed)

## 2021-02-02 NOTE — Progress Notes (Signed)
Manufacturing engineer [facility]) Hospital Liaison: RN note      Notified by Transition of Care Manger of patient/family request for [facility] services at home after discharge. Chart and patient information to be reviewed by [facility] physician. Hospice eligibility pending at this time.   Writer spoke to reach out to pt's duaghter tomorrow to initiate education related to hospice philosophy, services and team approach to care.      Please do not hesitate to call with questions.   Thank you for the opportunity to participate in this patient's care.   Domenic Moras, BSN, RN       [place] (listed on AMION under [place]615 [phone]  [phone] (24h on call)

## 2021-02-02 NOTE — Progress Notes (Addendum)
RT called to assess patient for SOB.  Upon arrival patient is sitting up in bed. No distress noted. HR 63, RR 21 Spo2 93%.  Increased to 4L [place]. Patient denies trouble breathing. Refused nebulizer treatment. Patient states she feels better at this time.

## 2021-02-02 NOTE — Progress Notes (Signed)
Daily Progress Note   Patient Name: Cathy [name]       Date: 02/02/2021 DOB: 04-17-1927  Age: 87 y.o. MRN#: [id number] Attending Physician: Geradine Girt, DO Primary Care Physician: Biagio Borg, MD Admit Date: 01/19/2021 Length of Stay: 14 days  Reason for Consultation/Follow-up: Establishing goals of care  HPI/Patient Profile:  87 y.o. female  with past medical history of hypertension, dyslipidemia, heart failure, paroxysmal atrial flutter, hiatal hernia, osteoarthritis presented with cough and dyspnea.  She had recent hospitalization from 01/06/2021 to 01/12/2021 for acute hypoxemic respiratory failure due to pneumonia and rhinovirus infection.  She received antibiotic therapy and was discharged to skilled nursing facility on supplemental oxygen 3 L/min. At facility she continued to have cough and was requiring 4 L of oxygen, O2 sats was 88%.  She was brought to the ED.  Chest x-ray showed new interstitial/alveolar infiltrate right upper lobe and left lower lobe. She was admitted on 01/19/2021 with acute hypoxemic respiratory failure, diastolic heart failure, paroxysmal A. fib, and others.    During her hospitalization she developed increasing O2 requirements up to 15 L despite diuresis.  Swallow eval 11/25 with no aspiration.  No history of lung issues.  Pulmonary was consulted for further recommendations.  Recommended out of bed greater than 30 degrees at all times as due to reflux, DuoNebs, pulmonary hygiene, encourage mobilization and chest physiotherapy.  Overall poor prognosis.   PMT was consulted for goals of care conversation.  Hospitalist spoke with daughter about consistent increasing oxygen requirements over the past several days despite antibiotics.  Currently a DNR and recommended if no improvement over the next few days, they would recommend comfort measures. However, ovr the past 2-3 days the patient has improved and is now down to 2 lpm O2 by nasal cannula.  Subjective:    Subjective: Chart Reviewed. Updates received. Patient Assessed. Created space and opportunity for patient  and family to explore thoughts and feelings regarding current medical situation.  Today's Discussion: Today I met with 3 members of the patient's family including her daughter.  We discussed the current clinical situation including new findings on CT as described above.  Discussed high risk for aspiration pneumonia and repeated hospitalizations.  I expressed my fear that she is likely near her "new baseline" and very high risk for recurrent deterioration.  They expressed that the patient is not wanting to go to a skilled nursing facility but she is wanting to go home.  The patient currently lives at home alone and explained that home hospice is not a 24-hour care.  They state that they will try to "muster the troops" to be able to provide around-the-clock care.  They are agreeable to a [facility] consult and notified hospital liaison to come speak with them.  They feel that she will likely need some equipment at home, at least oxygen and may be a hospital bed.  I provided emotional general support through therapeutic listening, empathy, and other techniques.  I answered all questions and addressed all concerns to the best my ability.  Objective:   Vital Signs:  BP 128/71   Pulse 79   Temp 97.8 F (36.6 C) (Oral)   Resp 19   Ht 4' 7"  (1.397 m)   Wt 48.5 kg   SpO2 93%   BMI 24.87 kg/m   Palliative Assessment/Data: 40%   Assessment & Plan:   Impression: Present on Admission: . Acute on chronic respiratory failure with hypoxemia ([place]) . Prolonged Q-T interval on  ECG . Hypothyroidism . Normocytic anemia . Atrial fibrillation and flutter ([place]) . Heart failure with preserved ejection fraction ([place]) . Aspiration pneumonia [facility])  87 year old female with chronic comorbidities as described above and recent hospitalization for double pneumonia and rhinovirus.  She was transferred to a skilled  nursing facility where she had some desaturation episodes and was admitted for respiratory failure with hypoxemia, acute on chronic diastolic congestive heart failure, A. fib.  She has had increasing oxygen requirements from 10 L to 15 L to 20 L, high flow nasal cannula.  The patient was made a DNR over and recommended follow for 24 to 48 hours and if no improvement may consider comfort care.  Earlier in the week she did appear quite frail and lethargic. Previously I had a frank discussion with the patient's daughter who understood the severity of her illness.  We agreed to take it a day at a time for any signs of improvement.  Overall goal was to get better and get home, but she is realistic that this may not be possible.   She did have some improvement overall, but new imaging today with bilateral pleural effusions, worsening hiatal hernia now including majority of stomach and part of transverse colon. High risk for recurrent aspiration pneumonia. I fear we're near her new baseline and very high risk for worsening. After discussion with the patient's family about her current clinical situation and real future risks they have agreed to home hospice.  SUMMARY OF RECOMMENDATIONS   Remain DNR TOC referral for home hospice Notified [facility] liaison for evaluation Continue current care while inpatient Family will need a couple days to muster support for around-the-clock care PMT will continue to follow  Code Status: DNR  Prognosis: < 6 months  Discharge Planning: Home with Hospice  Discussed with: Medical team, nursing team, patient's family  Thank you for allowing [place] to participate in the care of Cathy [name] PMT will continue to support holistically.  Time Total: 75 min  Visit consisted of counseling and education dealing with the complex and emotionally intense issues of symptom management and palliative care in the setting of serious and potentially life-threatening illness. Greater than 50%  of  this time was spent counseling and coordinating care related to the above assessment and plan.  Walden Field, NP Palliative Medicine Team  Team Phone # [phone] (Nights/Weekends)  10/28/2020, 8:17 AM

## 2021-02-03 DIAGNOSIS — J9621 Acute and chronic respiratory failure with hypoxia: Secondary | ICD-10-CM | POA: Diagnosis not present

## 2021-02-03 LAB — CBC
HCT: 30.9 % — ABNORMAL LOW (ref 36.0–46.0)
Hemoglobin: 10.1 g/dL — ABNORMAL LOW (ref 12.0–15.0)
MCH: 30.1 pg (ref 26.0–34.0)
MCHC: 32.7 g/dL (ref 30.0–36.0)
MCV: 92 fL (ref 80.0–100.0)
Platelets: 244 10*3/uL (ref 150–400)
RBC: 3.36 MIL/uL — ABNORMAL LOW (ref 3.87–5.11)
RDW: 16.3 % — ABNORMAL HIGH (ref 11.5–15.5)
WBC: 13.7 10*3/uL — ABNORMAL HIGH (ref 4.0–10.5)
nRBC: 0 % (ref 0.0–0.2)

## 2021-02-03 LAB — BASIC METABOLIC PANEL
Anion gap: 7 (ref 5–15)
BUN: 11 mg/dL (ref 8–23)
CO2: 26 mmol/L (ref 22–32)
Calcium: 8.3 mg/dL — ABNORMAL LOW (ref 8.9–10.3)
Chloride: 100 mmol/L (ref 98–111)
Creatinine, Ser: 0.87 mg/dL (ref 0.44–1.00)
GFR, Estimated: >60 mL/min (ref >60)
Glucose, Bld: 90 mg/dL (ref 70–99)
Potassium: 4.7 mmol/L (ref 3.5–5.1)
Sodium: 133 mmol/L — ABNORMAL LOW (ref 135–145)

## 2021-02-03 MED ORDER — ISOSORBIDE MONONITRATE ER 30 MG PO TB24
30.0000 mg | ORAL_TABLET | Freq: Every day | ORAL | Status: DC
  Administered 2021-02-03 – 2021-02-04 (×2): 30 mg via ORAL
  Filled 2021-02-03 (×2): qty 1

## 2021-02-03 NOTE — Progress Notes (Signed)
Mobility Specialist Progress Note    02/03/21 1524  Mobility  Activity Refused mobility   Pt stated she was good in bed.   [facility] [place] Mobility Specialist  M.S. Primary Phone: [phone] M.S. Secondary Phone: [phone]

## 2021-02-03 NOTE — Progress Notes (Signed)
Progress Note    Cathy [name]  MWU:[id number] DOB: 1927-03-07  DOA: 01/19/2021 PCP: Biagio Borg, MD    Brief Narrative:     Medical records reviewed and are as summarized below:  Cathy [name] is an 87 y.o. female with medical history of hypertension, dyslipidemia, heart failure, paroxysmal atrial flutter, hiatal hernia, osteoarthritis presented with cough and dyspnea.  She had recent hospitalization from 01/06/2021 to 01/12/2021 for acute hypoxemic respiratory failure due to pneumonia and rhinovirus infection.  She received antibiotic therapy and was discharged to skilled nursing facility on supplemental oxygen 3 L/min. At facility she continued to have cough and was requiring 4 L of oxygen, O2 sats was 88%.  She was brought to the ED.  Chest x-ray showed new interstitial/alveolar infiltrate right upper lobe and left lower lobe.  She has continued to worsen in hospital and O2 needs increased to 20L.  Poor overall prognosis but currently being weaned down to 3L. Patient and family met with palliative care and plan is for home with hospice over the next few days.     Assessment/Plan:   Principal Problem:   Acute on chronic respiratory failure with hypoxemia (HCC) Active Problems:   Normocytic anemia   Prolonged Q-T interval on ECG   Atrial fibrillation and flutter (HCC)   Hypothyroidism   Heart failure with preserved ejection fraction (HCC)   Aspiration pneumonia (HCC)   Acute hypoxemic respiratory failure initially thought to be due to multifocal pneumonia, right upper lobe and left lower lobe -Swallow evaluation shows no aspiration - suspect her hiatal hernia is causing aspiration  -treated with 7 days of IV Unasyn -Continue bronchodilator therapy -Pulmonology was consulted- supported comfort care if patient wanted to pursue -palliative care consulted for discussion of goals of care- transition to hospice 12/5 -OOB, aggressive pulmonary toiletry -repeat x ray on 12/4  shows ? Fluid so will do trial of IV lasix x 1- seemed to have increased urine output so will do daily for now and monitor labs, CT Scan shows pleural effusions- low BP is not limiting her diuresis  -aggressive incentive spirometry and mobility as severe atelectasis was seen on CT scan  Large hiatal hernia -chronic- suspect worsening her breathing issues -small frequent meals  Constipation -CT Scan shows: Moderate to severe stool retention -will add more aggressive bowel regimen -bowel sounds sluggish  ST elevation:  -cards consult:  She does not have clinical features of a STEMI. She is comfortable today. Trop was negative. Further she is DNR and high risk for any procedure. - no recommendations for ischemic work up - cont statin  Vaginal yeast infection - diflucan x 5 days -improved - continue to monitor -Qtc stable on EKG- monitor on tele  Acute on chronic diastolic heart failure -BNP on admission was 220 -IV lasix x 2- hold for hypotension  Paroxysmal atrial flutter -Rate controlled; continue amiodarone -Continue apixaban for anticoagulation   Hypertension -Continue isosorbide but at a lower dose as BP on low side   Hypothyroidism -Continue Synthroid   Dyslipidemia -Continue atorvastatin   Hypovolemic hyponatremia -Resolved   Anemia- unclear etiology -Hemoglobin stable   Right upper extremity edema -Venous duplex: negative for DVT    Goals of care -Palliative care has seen--plan now is to transition to hospice at home-- family is working on setting up caregivers    Family Communication/Anticipated D/C date and plan/Code Status   DVT prophylaxis: NOAC Code Status: DNR Disposition Plan: Status is: Inpatient  Spoke with daughter and daughter in law on 12/5   Medical Consultants:   PCCM: Patient is overall very frail and unwilling to participate in certain parts of her care which is not unreasonable given her frailty and age. Recommend continue current  management and recommendations as proposed yesterday. I would be in agreement with transition to comfort measures if that is what the patient would like. Cards Palliative care  Subjective:   Says she did not sleep well last night No nausea   Objective:    Vitals:   02/02/21 2307 02/03/21 0312 02/03/21 0814 02/03/21 0855  BP: (!) 90/58 (!) 85/44  (!) 93/52  Pulse: 61 (!) 56  72  Resp: _0 Temp: 97.7 F (36.5 C) 98 F (36.7 C)  (!) 97.3 F (36.3 C)  TempSrc: Oral Oral  Oral  SpO2: 95% 98% 95% 91%  Weight:      Height:        Intake/Output Summary (Last 24 hours) at 02/03/2021 0949 Last data filed at 02/03/2021 0730 Gross per 24 hour  Intake 366 ml  Output 50 ml  Net 316 ml    Filed Weights   01/19/21 1333  Weight: 48.5 kg    Exam:  General: Appearance:    Elderly/frail female in no acute distress   Bowel sounds sluggish, NT to palpation  Lungs:     On [place], poor effort, respirations unlabored  Heart:    Normal heart rate.   MS:   All extremities are intact.    Neurologic:   Awake, alert, pleasant and cooperative            Data Reviewed:   I have personally reviewed following labs and imaging studies:  Labs: Labs show the following:   Basic Metabolic Panel: Recent Labs  Lab 01/29/21 0120 01/31/21 0045 02/02/21 0012 02/03/21 0109  NA 138 135 131* 133*  K 4.2 4.2 4.0 4.7  CL 105 103 98 100  CO2 _1 GLUCOSE 88 76 91 90  BUN _2 CREATININE 0.80 0.91 0.83 0.87  CALCIUM 8.6* 8.4* 8.3* 8.3*   GFR Estimated Creatinine Clearance: 26.5 mL/min (by C-G formula based on SCr of 0.87 mg/dL). Liver Function Tests: No results for input(s): AST, ALT, ALKPHOS, BILITOT, PROT, ALBUMIN in the last 168 hours. No results for input(s): LIPASE, AMYLASE in the last 168 hours. No results for input(s): AMMONIA in the last 168 hours. Coagulation profile No results for input(s): INR, PROTIME in the last 168 hours.  CBC: Recent Labs  Lab  01/29/21 0120 01/31/21 0045 02/03/21 0109  WBC 13.0* 14.2* 13.7*  HGB 9.6* 9.4* 10.1*  HCT 30.6* 29.7* 30.9*  MCV 95.3 93.1 92.0  PLT 257 260 244   Cardiac Enzymes: No results for input(s): CKTOTAL, CKMB, CKMBINDEX, TROPONINI in the last 168 hours. BNP (last 3 results) No results for input(s): PROBNP in the last 8760 hours. CBG: No results for input(s): GLUCAP in the last 168 hours. D-Dimer: No results for input(s): DDIMER in the last 72 hours. Hgb A1c: No results for input(s): HGBA1C in the last 72 hours. Lipid Profile: No results for input(s): CHOL, HDL, LDLCALC, TRIG, CHOLHDL, LDLDIRECT in the last 72 hours. Thyroid function studies: No results for input(s): TSH, T4TOTAL, T3FREE, THYROIDAB in the last 72 hours.  Invalid input(s): FREET3 Anemia work up: No results for input(s): VITAMINB12, FOLATE, FERRITIN, TIBC, IRON, RETICCTPCT in the last 72 hours. Sepsis Labs: Recent Labs  Lab 01/29/21 0120 01/31/21 0045 02/03/21 0109  WBC 13.0* 14.2* 13.7*    Microbiology No results found for this or any previous visit (from the past 240 hour(s)).   Procedures and diagnostic studies:  CT ABDOMEN PELVIS W CONTRAST  Result Date: 02/02/2021 CLINICAL DATA:  Abdominal pain. EXAM: CT ABDOMEN AND PELVIS WITH CONTRAST TECHNIQUE: Multidetector CT imaging of the abdomen and pelvis was performed using the standard protocol following bolus administration of intravenous contrast. CONTRAST:  68m OMNIPAQUE IOHEXOL 300 MG/ML  SOLN COMPARISON:  Abdominal and pelvis CT with contrast 02/21/2018 and 11/27/2016. FINDINGS: Lower chest: Again noted is a large hiatal hernia with intrathoracic stomach and mid transverse colon. Large right and moderate to large left pleural effusions. There previously were moderate and small pleural effusions, respectively. There is a chronic right lower lobe collapse along side the effusion and partial collapse of left lower lobe. Increased patchy ground-glass haziness in  the more anterior lung bases which could be pneumonitis or ground-glass edema. Moderate to severe cardiomegaly is increased in all chambers. Three-vessel coronary artery calcifications are again noted and heavy calcification in the mitral ring Hepatobiliary: Chronic post cholecystectomy biliary dilatation, actually is less pronounced today. The common bile duct is 8.5 mm, previously 12 mm. No liver mass is seen. Unchanged prominent liver length 19 cm. Pancreas: No mass or ductal dilatation. Spleen: Stable mild splenomegaly with innumerable low-attenuation lesions scattered throughout the substance. Largest of these is 4.0 cm posteriorly, and is not significantly changed. Some of the smaller lesions are slightly larger than previously however, but according to the report of the last CT there was no PET positivity in these. There may be a few new smaller sized lesions. Adrenals/Urinary Tract: heterogeneous left adrenal mass measures 3.4 x 2.5 cm, previously 3.0 x 1.9 cm. The right adrenal gland is normal. Renal cortical thinning is similar to prior studies with small renal cysts but no concerning lesion, calculus or hydronephrosis. No bladder thickening. Stomach/Bowel: As above, large hiatal hernia again noted with intrathoracic stomach and mid transverse colon. Again, the large intestinal wall is narrowed where it enters and exits the hernia sac but this was seen previously, without wall thickening. An appendix is not seen in this patient. Small bowel opacification and caliber are normal. There are colonic diverticula without focal inflammatory reaction. Moderate to severe stool retention. Vascular/Lymphatic: The abdominal aorta is tortuous and moderately calcified with calcifications extending into the iliac arteries. There is no appreciable adenopathy. Other: There is interval increased body wall anasarca and mesenteric haziness. There is a small volume of abdominal and pelvic ascites but no large pockets. There is  no free air or hemorrhage, no abscess. Reproductive: Uterus is absent. Numerous pelvic phleboliths. No adnexal mass. Musculoskeletal: Osteopenia, lumbar S shaped scoliosis and advanced degenerative changes of the spine are redemonstrated. Interval left hip nailing. IMPRESSION: 1. There is increased panchamber cardiomegaly and increased large right and moderate left pleural effusions. 2. There is increased body wall anasarca and mesenteric haziness, which could relate to hepatic dysfunction, malnutrition or could be congestive or from fluid overload. 3. Chronically collapsed right lower lobe with compressive atelectasis of the remaining visible posterior lungs and ground-glass opacities in the more anterior lung bases which could be pneumonitis or ground-glass edema. 4. Small amount of ascites. 5. Chronic large hiatal hernia with intrathoracic stomach and mid transverse colon with colonic narrowing at the hernia mouth but no wall thickening. There is moderate to severe stool retention in most of the colon except  for the relatively decompressed rectosigmoid segments but there is no dilated small bowel. 6. Innumerable low-density irregular lesions in the spleen. Some of the smaller lesions are larger than previously and there appear to be at least a few more smaller lesions new from prior studies. This reportedly did not show increased PET activity but a repeat PET-CT should be considered given the increase in size of some of the smaller lesions and the slight increased size of the previously noted left adrenal mass, previously thought to be an adenoma. 7. Chronic postcholecystectomy biliary dilatation, less pronounced today than previously. 8. Aortic and coronary artery atherosclerosis. Electronically Signed   By: Telford Nab M.D.   On: 02/02/2021 06:30    Medications:    amiodarone  200 mg Oral Daily   apixaban  2.5 mg Oral BID   atorvastatin  20 mg Oral q1800   darifenacin  7.5 mg Oral Daily   fluconazole   50 mg Oral Daily   fluticasone  2 spray Each Nare BID   Gerhardt's butt cream  1 application Topical Daily   guaiFENesin  600 mg Oral BID   ipratropium-albuterol  3 mL Nebulization BID   isosorbide mononitrate  30 mg Oral Daily   levothyroxine  62.5 mcg Oral Q0600   pantoprazole  40 mg Oral BID   polyethylene glycol  17 g Oral BID   saccharomyces boulardii  250 mg Oral BID   sodium chloride flush  3 mL Intravenous Q12H   Continuous Infusions:   LOS: 15 days   Geradine Girt  [facility]   How to contact the [facility] Attending or Consulting provider [place] or covering provider during after hours [place], for this patient?  Check the care team in [facility] and look for a) attending/consulting [facility] provider listed and b) the [facility] team listed Log into [email or web address] and use [place]'s universal password to access. If you do not have the password, please contact the hospital operator. Locate the [facility] provider you are looking for under [facility] and page to a number that you can be directly reached. If you still have difficulty reaching the provider, please page the [facility] (Director on Call) for the Hospitalists listed on amion for assistance.  02/03/2021, 9:49 AM

## 2021-02-03 NOTE — TOC Progression Note (Signed)
Transition of Care [facility]) - Progression Note    Patient Details  Name: Cathy [name] MRN: [id number] Date of Birth: 05-07-1927  [facility]) CM/SW Contact  Zenon Mayo, RN Phone Number: 02/03/2021, 4:05 PM  Clinical Narrative:    NCM received information from secretayt to call Babara at [phone].  NCM contacted [name] and she asked about how to find private duty sitters . NCM informed her to go to internet to Medicare.gov and put her zip code in and click on private duty care and a list will come up.  She states she will speak with patient this evening to see if she wants a hospital bed or to keep her bed.  She states patient is not aware that she is declining and they do not want to put to her that way, so they will speak with her about the DME, she will need ambulance transport, will try to use [facility] and DME has been delivered by [employer].    Expected Discharge Plan: [place] Barriers to Discharge: Continued Medical Work up  Expected Discharge Plan and Services Expected Discharge Plan: [place] In-house Referral: Clinical Social Work   Post Acute Care Choice: [place] Living arrangements for the past 2 months: [place], [place]                                       Social Determinants of Health (SDOH) Interventions    Readmission Risk Interventions No flowsheet data found.

## 2021-02-03 NOTE — Progress Notes (Addendum)
[id number]  [facility]) Hospital Liaison Note  Update: [facility] liaison was able to connect with family. Family would like to speak with patient tonight to determine which DME she would like in the home before proceeding with hospice referral. [place] liaison will follow up tomorrow with family to order DME and get admission visit scheduled.   Referral received. Liaison has been unable to connect with family. Will continue to follow and attempt connection.   [facility] Liaison  [phone]

## 2021-02-03 NOTE — Plan of Care (Signed)

## 2021-02-04 ENCOUNTER — Other Ambulatory Visit: Payer: Self-pay | Admitting: Cardiology

## 2021-02-04 DIAGNOSIS — K449 Diaphragmatic hernia without obstruction or gangrene: Secondary | ICD-10-CM

## 2021-02-04 DIAGNOSIS — I480 Paroxysmal atrial fibrillation: Secondary | ICD-10-CM

## 2021-02-04 DIAGNOSIS — K5900 Constipation, unspecified: Secondary | ICD-10-CM

## 2021-02-04 DIAGNOSIS — R5381 Other malaise: Secondary | ICD-10-CM

## 2021-02-04 DIAGNOSIS — I5033 Acute on chronic diastolic (congestive) heart failure: Secondary | ICD-10-CM

## 2021-02-04 DIAGNOSIS — J90 Pleural effusion, not elsewhere classified: Secondary | ICD-10-CM

## 2021-02-04 MED ORDER — ISOSORBIDE MONONITRATE ER 30 MG PO TB24: 15.0000 mg | ORAL_TABLET | Freq: Every day | ORAL | Status: DC

## 2021-02-04 MED ORDER — IPRATROPIUM-ALBUTEROL 0.5-2.5 (3) MG/3ML IN SOLN: 3.0000 mL | Freq: Four times a day (QID) | RESPIRATORY_TRACT | Status: DC | PRN

## 2021-02-04 MED ORDER — SENNOSIDES-DOCUSATE SODIUM 8.6-50 MG PO TABS
1.0000 | ORAL_TABLET | Freq: Two times a day (BID) | ORAL | Status: DC | PRN
  Administered 2021-02-05: 1 via ORAL
  Filled 2021-02-04: qty 1

## 2021-02-04 NOTE — Progress Notes (Signed)
PROGRESS NOTE  Cathy [name] SHN:[id number] DOB: Apr 21, 1927   PCP: Biagio Borg, MD  Patient is from: SNF  DOA: 01/19/2021 LOS: 22  Chief complaints:  Chief Complaint  Patient presents with   Shortness of Breath          Brief Narrative / Interim history: 87 y.o. female with medical history of hypertension, dyslipidemia, heart failure, paroxysmal atrial flutter, hiatal hernia, osteoarthritis presented with cough and dyspnea.  She had recent hospitalization from 01/06/2021 to 01/12/2021 for acute hypoxemic respiratory failure due to pneumonia and rhinovirus infection.  She received antibiotic therapy and was discharged to skilled nursing facility on supplemental oxygen 3 L/min. At facility she continued to have cough and was requiring 4 L of oxygen, O2 sats was 88%.  She was brought to the ED.  Chest x-ray showed new interstitial/alveolar infiltrate right upper lobe and left lower lobe.  CT abdomen and pelvis with increased panchamber cardiomegaly, increased large right and moderate left pleural effusion, anasarca, chronically collapsed RLL with compressive atelectasis, GG opacities in anterior lung bases, small amount of ascites and chronic large hiatal hernia with intrathoracic stomach and mid transverse colon with colonic narrowing at the hernia mass and innumerable low-density irregular lesions in the spleen.  Patient required up to 20 L of oxygen at some point but oxygen requirement improved.  However, poor overall prognosis.  Patient and family met with palliative care and plan is hospice follow-up at next venue.  Subjective: Seen and examined earlier this morning.  No major events overnight of this morning.  She is sleepy but wakes to voice.  Has no complaints.  She denies pain, shortness of breath, GI or UTI symptoms.  Objective: Vitals:   02/04/21 0321 02/04/21 0808 02/04/21 0852 02/04/21 1234  BP: 110/63 (!) 108/53  (!) 92/58  Pulse: (!) 51 60  62  Resp: 16 14  15   Temp: 97.8  F (36.6 C) 97.9 F (36.6 C)  (!) 97.5 F (36.4 C)  TempSrc: Oral Oral  Oral  SpO2: 97% 97% 95% 95%  Weight:      Height:        Intake/Output Summary (Last 24 hours) at 02/04/2021 1620 Last data filed at 02/03/2021 2216 Gross per 24 hour  Intake 123 ml  Output --  Net 123 ml   Filed Weights   01/19/21 1333  Weight: 48.5 kg    Examination:  GENERAL: No apparent distress.  Nontoxic. HEENT: MMM.  Vision and hearing grossly intact.  NECK: Supple.  No apparent JVD.  RESP:  No IWOB.  Fair aeration bilaterally. CVS:  RRR. Heart sounds normal.  ABD/GI/GU: BS+. Abd soft, NTND.  MSK/EXT:  Moves extremities. No apparent deformity. No edema.  SKIN: no apparent skin lesion or wound NEURO: Awake, alert and oriented fairly.  No apparent focal neuro deficit. PSYCH: Calm. Normal affect.   Procedures:  None  Microbiology summarized: 11/21-COVID-19 and influenza PCR nonreactive. 11/22-MRSA PCR nonreactive. 11/21-blood cultures NGTD.  Assessment & Plan: Acute hypoxemic respiratory failure-multifactorial including multifocal pneumonia, pleural effusion, heart failure and large hiatal hernia.  Required up to 20 L at some point but improved.  Currently on 3 L at rest. -Completed 7 days of IV Unasyn for possible aspiration pneumonia -Continue supportive care  Large hiatal hernia-could be contributing to her respiratory symptoms. -chronic- suspect worsening her breathing issues -Elevate head of bed  Bilateral pleural effusion: CT abdomen and pelvis showed large right and moderate left pleural effusion.  Parapneumonic? -Consider thoracocentesis if  respiratory distress   Constipation: CT Scan shows: Moderate to severe stool retention -Bowel regimen.   ST elevation: Cardiology consulted and was not convinced of STEMI.  Patient without chest pain.  Tropes negative.  High risk for procedure. -Medical management  Acute on chronic diastolic CHF: Limited TTE on 11/27 with LVEF of 65 to  70%, indeterminate DD, RVSP of 46, severe LAE, severe RAE, moderate TVR. BNP 220 on presentation.  Imaging with significant pleural effusion bilaterally. Appears euvolemic on exam.  -Monitor fluid status and respiratory status.  Paroxysmal A. fib/a flutter: Rate controlled. -Continue amiodarone and Eliquis   Vaginal yeast infection -Diflucan x 5 days -Improved - continue to monitor -Qtc stable on EKG- monitor on tele   Hypertension: Slightly hypotensive. -Discontinue Imdur.   Hypothyroidism -Continue Synthroid   Dyslipidemia -Continue atorvastatin   Hypovolemic hyponatremia: Stable.   -Resolved   Normocytic anemia: H&H relatively stable. Recent Labs    01/09/21 0439 01/19/21 1355 01/21/21 0201 01/22/21 1011 01/23/21 0106 01/24/21 0054 01/26/21 0049 01/29/21 0120 01/31/21 0045 02/03/21 0109  HGB 10.6* 11.3* 8.8* 9.6* 8.6* 9.7* 8.6* 9.6* 9.4* 10.1*  -Continue monitoring   Right upper extremity edema: Venous duplex: negative for DVT   -Already on anticoagulation.  Goals of care counseling-patient is DNR/DNI which is appropriate.  Palliative medicine following.  Plan for hospice follow-up at the next venue which seems to be SNF.  Leukocytosis/bandemia: Improved.  Body mass index is 24.87 kg/m.         DVT prophylaxis:  apixaban (ELIQUIS) tablet 2.5 mg Start: 01/19/21 2200 apixaban (ELIQUIS) tablet 2.5 mg  Code Status: DNR/DNI Family Communication: Called patient's daughter for update.  She is at the grocery store.  Preferred to be called in the morning Level of care: Telemetry Medical Status is: Inpatient  Remains inpatient appropriate because: Safe disposition   Final disposition likely SNF.  Consultants:  Pulmonology Cardiology   Sch Meds:  Scheduled Meds:  amiodarone  200 mg Oral Daily   apixaban  2.5 mg Oral BID   atorvastatin  20 mg Oral q1800   darifenacin  7.5 mg Oral Daily   fluticasone  2 spray Each Nare BID   Gerhardt's butt cream  1  application Topical Daily   guaiFENesin  600 mg Oral BID   isosorbide mononitrate  30 mg Oral Daily   levothyroxine  62.5 mcg Oral Q0600   pantoprazole  40 mg Oral BID   polyethylene glycol  17 g Oral BID   saccharomyces boulardii  250 mg Oral BID   sodium chloride flush  3 mL Intravenous Q12H   Continuous Infusions: PRN Meds:.acetaminophen, bisacodyl, guaiFENesin-dextromethorphan, ipratropium-albuterol, sodium chloride, sodium chloride flush  Antimicrobials: Anti-infectives (From admission, onward)    Start     Dose/Rate Route Frequency Ordered Stop   02/01/21 1000  fluconazole (DIFLUCAN) tablet 50 mg        50 mg Oral Daily 02/01/21 0721 02/04/21 0921   01/31/21 1000  fluconazole (DIFLUCAN) tablet 100 mg  Status:  Discontinued        100 mg Oral Daily 01/31/21 0823 02/01/21 0721   01/30/21 1430  fluconazole (DIFLUCAN) tablet 150 mg        150 mg Oral  Once 01/30/21 1335 01/30/21 1745   01/20/21 1800  ampicillin-sulbactam (UNASYN) 1.5 g in sodium chloride 0.9 % 100 mL IVPB        1.5 g 200 mL/hr over 30 Minutes Intravenous Every 12 hours 01/20/21 1417 01/27/21 0604   01/20/21  1500  cefTRIAXone (ROCEPHIN) 2 g in sodium chloride 0.9 % 100 mL IVPB  Status:  Discontinued        2 g 200 mL/hr over 30 Minutes Intravenous Every 24 hours 01/19/21 1621 01/20/21 1414   01/19/21 2200  doxycycline (VIBRA-TABS) tablet 100 mg  Status:  Discontinued        100 mg Oral Every 12 hours 01/19/21 1621 01/20/21 1414   01/19/21 1445  cefTRIAXone (ROCEPHIN) 1 g in sodium chloride 0.9 % 100 mL IVPB        1 g 200 mL/hr over 30 Minutes Intravenous  Once 01/19/21 1443 01/19/21 1539   01/19/21 1445  doxycycline (VIBRA-TABS) tablet 100 mg        100 mg Oral  Once 01/19/21 1443 01/19/21 1508        I have personally reviewed the following labs and images: CBC: Recent Labs  Lab 01/29/21 0120 01/31/21 0045 02/03/21 0109  WBC 13.0* 14.2* 13.7*  HGB 9.6* 9.4* 10.1*  HCT 30.6* 29.7* 30.9*  MCV 95.3  93.1 92.0  PLT 257 260 244   BMP &GFR Recent Labs  Lab 01/29/21 0120 01/31/21 0045 02/02/21 0012 02/03/21 0109  NA 138 135 131* 133*  K 4.2 4.2 4.0 4.7  CL 105 103 98 100  CO2 26 28 28 26   GLUCOSE 88 76 91 90  BUN 11 9 8 11   CREATININE 0.80 0.91 0.83 0.87  CALCIUM 8.6* 8.4* 8.3* 8.3*   Estimated Creatinine Clearance: 26.5 mL/min (by C-G formula based on SCr of 0.87 mg/dL). Liver & Pancreas: No results for input(s): AST, ALT, ALKPHOS, BILITOT, PROT, ALBUMIN in the last 168 hours. No results for input(s): LIPASE, AMYLASE in the last 168 hours. No results for input(s): AMMONIA in the last 168 hours. Diabetic: No results for input(s): HGBA1C in the last 72 hours. No results for input(s): GLUCAP in the last 168 hours. Cardiac Enzymes: No results for input(s): CKTOTAL, CKMB, CKMBINDEX, TROPONINI in the last 168 hours. No results for input(s): PROBNP in the last 8760 hours. Coagulation Profile: No results for input(s): INR, PROTIME in the last 168 hours. Thyroid Function Tests: No results for input(s): TSH, T4TOTAL, FREET4, T3FREE, THYROIDAB in the last 72 hours. Lipid Profile: No results for input(s): CHOL, HDL, LDLCALC, TRIG, CHOLHDL, LDLDIRECT in the last 72 hours. Anemia Panel: No results for input(s): VITAMINB12, FOLATE, FERRITIN, TIBC, IRON, RETICCTPCT in the last 72 hours. Urine analysis:    Component Value Date/Time   COLORURINE YELLOW 01/06/2021 1957   APPEARANCEUR CLEAR 01/06/2021 1957   LABSPEC 1.012 01/06/2021 1957   PHURINE 5.0 01/06/2021 1957   GLUCOSEU NEGATIVE 01/06/2021 1957   GLUCOSEU NEGATIVE 04/11/2017 1536   HGBUR MODERATE (A) 01/06/2021 1957   BILIRUBINUR NEGATIVE 01/06/2021 1957   BILIRUBINUR neg 11/18/2016 [place] 01/06/2021 1957   PROTEINUR NEGATIVE 01/06/2021 1957   UROBILINOGEN 0.2 04/11/2017 1536   NITRITE NEGATIVE 01/06/2021 1957   LEUKOCYTESUR LARGE (A) 01/06/2021 1957   Sepsis Labs: Invalid input(s): PROCALCITONIN,  [place]  Microbiology: No results found for this or any previous visit (from the past 240 hour(s)).  Radiology Studies: No results found.    Camillo Quadros T. Irwin  If 7PM-7AM, please contact night-coverage [email or web address] 02/04/2021, 4:20 PM

## 2021-02-04 NOTE — Plan of Care (Signed)

## 2021-02-04 NOTE — TOC Progression Note (Signed)
Transition of Care [facility]) - Progression Note    Patient Details  Name: Cathy [name] MRN: [id number] Date of Birth: December 02, 1927  Transition of Care [facility]) CM/SW Contact  Zenon Mayo, RN Phone Number: 02/04/2021, 9:42 PM  Clinical Narrative:    NCM was notfied that daughter n law ,[name] state they have now decided to let patient go to SNF for long term care, CSW is working up SNF .   Expected Discharge Plan: [place] Barriers to Discharge: Continued Medical Work up  Expected Discharge Plan and Services Expected Discharge Plan: [place] In-house Referral: Clinical Social Work   Post Acute Care Choice: [place] Living arrangements for the past 2 months: [place], [place]                                       Social Determinants of Health (SDOH) Interventions    Readmission Risk Interventions No flowsheet data found.

## 2021-02-04 NOTE — TOC Progression Note (Addendum)
Transition of Care [facility]) - Progression Note    Patient Details  Name: Cathy [name] MRN: [id number] Date of Birth: 10/17/1927  Transition of Care [facility]) CM/SW Contact  Reece Agar, [place] Phone Number: 02/04/2021, 10:04 AM  Clinical Narrative:    Pt daughter-in-law Cathy [name] contacted CSW to inquire about long term SNF options for pt. Family is reconsidering Hospice at home and is thinking about paying out of pocket for long term SNF. CSW will fax pt out with the understanding that pt is looking for SNF long term.  CSW will follow up with Cathy [name] with any LTC approvals. Cathy [name] provided her e-mail [email or web address] to send the list of offers to if any.   Expected Discharge Plan: [place] Barriers to Discharge: Continued Medical Work up  Expected Discharge Plan and Services Expected Discharge Plan: [place] In-house Referral: Clinical Social Work   Post Acute Care Choice: [place] Living arrangements for the past 2 months: [place], [place]                                       Social Determinants of Health (SDOH) Interventions    Readmission Risk Interventions No flowsheet data found.

## 2021-02-04 NOTE — Progress Notes (Signed)
Mobility Specialist Progress Note    02/04/21 1036  Mobility  Activity Ambulated in hall  Level of Assistance +2 (takes two people) (Chair follow)  Games developer wheel walker  Distance Ambulated (ft) 20 ft (10+10)  Mobility Ambulated with assistance in hallway  Mobility Response Tolerated fair  Mobility performed by Mobility specialist  Bed Position Chair  $Mobility charge 1 Mobility   Pre-Mobility:113/49 BP During Mobility: 86% SpO2 Post-Mobility: 64 HR, 94% SpO2  Pt received in bed and agreeable. Started on 3LO2 but increased to 4L when reliable pleth at 86%. Took x1 seated rest break outside of doorway. Went another 38' then rolled back in chair. Left in chair and on 3LO2 in room with call bell in reach.   [facility] Mobility Specialist  M.S. Primary Phone: [phone] M.S. Secondary Phone: [phone]

## 2021-02-04 NOTE — Plan of Care (Signed)
  Problem: Education: Goal: Knowledge of General Education information will improve Description: Including pain rating scale, medication(s)/side effects and non-pharmacologic comfort measures Outcome: Progressing   Problem: Activity: Goal: Risk for activity intolerance will decrease Outcome: Progressing   Problem: Nutrition: Goal: Adequate nutrition will be maintained Outcome: Progressing   Problem: Coping: Goal: Level of anxiety will decrease Outcome: Progressing   

## 2021-02-04 NOTE — Progress Notes (Signed)
[id number] [facility]) Hospital Liaison Note  I spoke with patient DIL/Barbara at length this morning in regards to hospice services (SNF vs. Home). Per her request, referral has been placed on 'hold' until definitive dispo plan can be put in place. Barbara plans to coordinate with [facility] and discuss SNF options. [facility] to continue to follow.   Please do not hesitate to call with any hospice related questions.    Thank you for the opportunity to participate in this patient's care.  Daphene Calamity, MSW [facility] Liaison  [phone]

## 2021-02-05 LAB — CBC
HCT: 30.6 % — ABNORMAL LOW (ref 36.0–46.0)
Hemoglobin: 9.7 g/dL — ABNORMAL LOW (ref 12.0–15.0)
MCH: 29.4 pg (ref 26.0–34.0)
MCHC: 31.7 g/dL (ref 30.0–36.0)
MCV: 92.7 fL (ref 80.0–100.0)
Platelets: 209 10*3/uL (ref 150–400)
RBC: 3.3 MIL/uL — ABNORMAL LOW (ref 3.87–5.11)
RDW: 16.5 % — ABNORMAL HIGH (ref 11.5–15.5)
WBC: 9.8 10*3/uL (ref 4.0–10.5)
nRBC: 0 % (ref 0.0–0.2)

## 2021-02-05 LAB — RENAL FUNCTION PANEL
Albumin: 1.9 g/dL — ABNORMAL LOW (ref 3.5–5.0)
Anion gap: 4 — ABNORMAL LOW (ref 5–15)
BUN: 9 mg/dL (ref 8–23)
CO2: 28 mmol/L (ref 22–32)
Calcium: 8.3 mg/dL — ABNORMAL LOW (ref 8.9–10.3)
Chloride: 100 mmol/L (ref 98–111)
Creatinine, Ser: 0.89 mg/dL (ref 0.44–1.00)
GFR, Estimated: >60 mL/min (ref >60)
Glucose, Bld: 82 mg/dL (ref 70–99)
Phosphorus: 3.1 mg/dL (ref 2.5–4.6)
Potassium: 4.1 mmol/L (ref 3.5–5.1)
Sodium: 132 mmol/L — ABNORMAL LOW (ref 135–145)

## 2021-02-05 LAB — MAGNESIUM: Magnesium: 2.1 mg/dL (ref 1.7–2.4)

## 2021-02-05 NOTE — Progress Notes (Signed)
Physical Therapy Treatment Patient Details Name: Cathy [name] MRN: [id number] DOB: 1927-03-14 Today's Date: 02/05/2021   History of Present Illness Pt is a 87 y.o. female admited from SNF on 01/19/21 with cough and dyspnea; workup for acute hypoxic respiratory failure secondary to multifocal PNA, requiring heated HFNC. Of note, recent admission 11/8-11/14/22 with PNA and rhinovirus infection with d/c to SNF for short-term rehab. PMH includes mild dementia, HTN, HF, aflutter, hernia, OA.    PT Comments    The pt was able to make good progress with OOB mobility this session, completing 5 bouts of ambulation in the room with minG, RW, and 2L O2. The pt was able to demo good ability to power up safely, but continues to demo poor functional power in BLE and requires increased use of UE and time to complete the transfer. Will continue to benefit form skilled PT acutely to progress functional endurance and independence to allow for greatest quality of life after d/c. Pt continues to make slow but steady progress at this time.    Recommendations for follow up therapy are one component of a multi-disciplinary discharge planning process, led by the attending physician.  Recommendations may be updated based on patient status, additional functional criteria and insurance authorization.  Follow Up Recommendations  Skilled nursing-short term rehab (<3 hours/day)     Assistance Recommended at Discharge Frequent or constant Supervision/Assistance  Equipment Recommendations  None recommended by PT (if going home with home hospice, recommend hospital bed, BSC, and RW)    Recommendations for Other Services       Precautions / Restrictions Precautions Precautions: Fall;Other (comment) Precaution Comments: on 2L O2 for session Restrictions Weight Bearing Restrictions: No     Mobility  Bed Mobility Overal bed mobility: Needs Assistance Bed Mobility: Supine to Sit     Supine to sit: Min assist      General bed mobility comments: minA to complete trunk elevation, pt able to move BLE without assist. increased time    Transfers Overall transfer level: Needs assistance Equipment used: Rolling walker (2 wheels) Transfers: Sit to/from Stand Sit to Stand: Min guard           General transfer comment: minG with significantly increased time to power up. maintains significant trunk flexion until directly cued a few times    Ambulation/Gait Ambulation/Gait assistance: Min guard Gait Distance (Feet): 8 Feet (x5) Assistive device: Rolling walker (2 wheels) Gait Pattern/deviations: Step-to pattern;Shuffle;Trunk flexed;Narrow base of support Gait velocity: reduced Gait velocity interpretation: <1.31 ft/sec, indicative of household ambulator   General Gait Details: small forwards steps with heavy reliance on BUE support on RW. cues for posture, positioning in RW, and trunk extension. minG for straight, minA to manage turning with RW     Balance Overall balance assessment: Needs assistance Sitting-balance support: Feet supported Sitting balance-Leahy Scale: Fair     Standing balance support: Bilateral upper extremity supported;During functional activity;Single extremity supported Standing balance-Leahy Scale: Poor Standing balance comment: Reliant on UE support through RW to maintain balance                            Cognition Arousal/Alertness: Awake/alert Behavior During Therapy: Flat affect Overall Cognitive Status: History of cognitive impairments - at baseline                                 General Comments: H/o dementia per  chart. Pt following simple commands appropriately; able to read clock and verbalized understanding of waiting 30 min in recliner. endorses significant fatigue           General Comments General comments (skin integrity, edema, etc.): VSS on 2L. SpO2 low of 90%      Pertinent Vitals/Pain Pain Assessment: No/denies pain      PT Goals (current goals can now be found in the care plan section) Acute Rehab PT Goals Patient Stated Goal: agreeable to return to facility PT Goal Formulation: With patient Time For Goal Achievement: 02/19/21 Potential to Achieve Goals: Fair Progress towards PT goals: Progressing toward goals    Frequency    Min 2X/week      PT Plan Current plan remains appropriate       AM-PAC PT "6 Clicks" Mobility   Outcome Measure  Help needed turning from your back to your side while in a flat bed without using bedrails?: A Little Help needed moving from lying on your back to sitting on the side of a flat bed without using bedrails?: A Little Help needed moving to and from a bed to a chair (including a wheelchair)?: A Little Help needed standing up from a chair using your arms (e.g., wheelchair or bedside chair)?: A Little Help needed to walk in hospital room?: A Little Help needed climbing 3-5 steps with a railing? : A Lot 6 Click Score: 17    End of Session Equipment Utilized During Treatment: Oxygen;Gait belt Activity Tolerance: Patient limited by fatigue Patient left: in chair;with call bell/phone within reach;with chair alarm set;with family/visitor present Nurse Communication: Mobility status PT Visit Diagnosis: Muscle weakness (generalized) (M62.81);Difficulty in walking, not elsewhere classified (R26.2)     Time: 7989-2119 PT Time Calculation (min) (ACUTE ONLY): 28 min  Charges:  $Gait Training: 23-37 mins                     [place] Carbo, PT, DPT   Acute Rehabilitation Department Pager #: [phone]   Sandra Cockayne 02/05/2021, 2:42 PM

## 2021-02-05 NOTE — Progress Notes (Signed)
PROGRESS NOTE  Cathy [name] HID:[id number] DOB: August 21, 1927   PCP: Biagio Borg, MD  Patient is from: SNF  DOA: 01/19/2021 LOS: 74  Chief complaints:  Chief Complaint  Patient presents with   Shortness of Breath          Brief Narrative / Interim history: 87 y.o. female with medical history of hypertension, dyslipidemia, heart failure, paroxysmal atrial flutter, hiatal hernia, osteoarthritis presented with cough and dyspnea.  She had recent hospitalization from 01/06/2021 to 01/12/2021 for acute hypoxemic respiratory failure due to pneumonia and rhinovirus infection.  She received antibiotic therapy and was discharged to skilled nursing facility on supplemental oxygen 3 L/min. At facility she continued to have cough and was requiring 4 L of oxygen, O2 sats was 88%.  She was brought to the ED.  Chest x-ray showed new interstitial/alveolar infiltrate right upper lobe and left lower lobe.  CT abdomen and pelvis with increased panchamber cardiomegaly, increased large right and moderate left pleural effusion, anasarca, chronically collapsed RLL with compressive atelectasis, GG opacities in anterior lung bases, small amount of ascites and chronic large hiatal hernia with intrathoracic stomach and mid transverse colon with colonic narrowing at the hernia mass and innumerable low-density irregular lesions in the spleen.  Patient required up to 20 L of oxygen at some point but oxygen requirement improved.  However, poor overall prognosis.  Patient and family met with palliative care and plan is hospice follow-up at next venue.  Subjective: Seen and examined earlier this morning.  No major events overnight of this morning.  No complaints  Objective: Vitals:   02/04/21 2345 02/05/21 0300 02/05/21 0352 02/05/21 0837  BP: 118/70  127/83 120/63  Pulse: (!) 55 (!) 53 (!) 55 (!) 52  Resp: _0 Temp: 97.9 F (36.6 C)  98.3 F (36.8 C) 97.7 F (36.5 C)  TempSrc: Oral  Oral Oral  SpO2: 94%  99% 96% 96%  Weight:      Height:        Intake/Output Summary (Last 24 hours) at 02/05/2021 1247 Last data filed at 02/04/2021 1910 Gross per 24 hour  Intake 240 ml  Output --  Net 240 ml   Filed Weights   01/19/21 1333  Weight: 48.5 kg    Examination:  GENERAL: Frail looking elderly female.  No apparent distress. HEENT: MMM.  Vision and hearing grossly intact.  NECK: Supple.  No apparent JVD.  RESP: 96% on 2 L.  No IWOB.  Fair aeration bilaterally. CVS:  RRR. Heart sounds normal.  ABD/GI/GU: BS+. Abd soft, NTND.  MSK/EXT:  Moves extremities. No apparent deformity.  Trace edema. SKIN: no apparent skin lesion or wound NEURO: Sleepy but wakes to voice.  Oriented fairly.  Follows commands.  No apparent focal neuro deficit. PSYCH: Calm. Normal affect.   Procedures:  None  Microbiology summarized: 11/21-COVID-19 and influenza PCR nonreactive. 11/22-MRSA PCR nonreactive. 11/21-blood cultures NGTD.  Assessment & Plan: Acute hypoxemic respiratory failure-multifactorial including multifocal pneumonia, pleural effusion, heart failure and large hiatal hernia.  Required up to 20 L at some point but improved.  Currently on 2 L at rest. -Completed 7 days of IV Unasyn for possible aspiration pneumonia -Continue supportive care  Large hiatal hernia-could be contributing to her respiratory symptoms. -Chronic- suspect worsening her breathing issues -Elevate head of bed  Bilateral pleural effusion: CT abdomen and pelvis showed large right and moderate left pleural effusion.  Parapneumonic? -Consider thoracocentesis if respiratory distress   Constipation: CT Scan  shows: Moderate to severe stool retention -Bowel regimen.   ST elevation: Cardiology consulted and was not convinced of STEMI.  Patient without chest pain.  Tropes negative.  High risk for procedure. -Medical management  Acute on chronic diastolic CHF: Limited TTE on 11/27 with LVEF of 65 to 70%, indeterminate DD, RVSP of  46, severe LAE, severe RAE, moderate TVR. BNP 220 on presentation.  Imaging with significant pleural effusion bilaterally. Appears euvolemic on exam except for trace BLE edema. -Monitor fluid status and respiratory status.  Paroxysmal A. fib/a flutter: Rate controlled. -Continue amiodarone and Eliquis   Vaginal yeast infection: Completed 5 days of Diflucan.   Hypertension: Normotensive after discontinuing Imdur.   Hypothyroidism -Continue Synthroid   Dyslipidemia -Continue atorvastatin   Hypovolemic hyponatremia: Stable.     Normocytic anemia: H&H relatively stable. Recent Labs    01/19/21 1355 01/21/21 0201 01/22/21 1011 01/23/21 0106 01/24/21 0054 01/26/21 0049 01/29/21 0120 01/31/21 0045 02/03/21 0109 02/05/21 0306  HGB 11.3* 8.8* 9.6* 8.6* 9.7* 8.6* 9.6* 9.4* 10.1* 9.7*  -Continue monitoring   Right upper extremity edema: venous duplex negative for DVT   -Already on anticoagulation.  Goals of care counseling-patient is DNR/DNI which is appropriate.  Palliative medicine following.  Plan for hospice follow-up at the next venue.   Leukocytosis/bandemia: Resolved.  Body mass index is 24.87 kg/m.         DVT prophylaxis:  apixaban (ELIQUIS) tablet 2.5 mg Start: 01/19/21 2200 apixaban (ELIQUIS) tablet 2.5 mg  Code Status: DNR/DNI Family Communication: Updated patient's daughter and daughter-in-law over the phone. Level of care: Telemetry Medical Status is: Inpatient  Remains inpatient appropriate because: Safe disposition.  Family looking for SNF in [place] area.  Also working on 24/7 care at home if no SNF bed in [place] area.  She needs DME from hospice if she goes home.   Final disposition: To be determined.  Consultants:  Pulmonology Cardiology   Sch Meds:  Scheduled Meds:  amiodarone  200 mg Oral Daily   apixaban  2.5 mg Oral BID   atorvastatin  20 mg Oral q1800   darifenacin  7.5 mg Oral Daily   fluticasone  2 spray Each Nare BID    Gerhardt's butt cream  1 application Topical Daily   guaiFENesin  600 mg Oral BID   levothyroxine  62.5 mcg Oral Q0600   pantoprazole  40 mg Oral BID   polyethylene glycol  17 g Oral BID   saccharomyces boulardii  250 mg Oral BID   sodium chloride flush  3 mL Intravenous Q12H   Continuous Infusions: PRN Meds:.acetaminophen, bisacodyl, guaiFENesin-dextromethorphan, ipratropium-albuterol, senna-docusate, sodium chloride, sodium chloride flush  Antimicrobials: Anti-infectives (From admission, onward)    Start     Dose/Rate Route Frequency Ordered Stop   02/01/21 1000  fluconazole (DIFLUCAN) tablet 50 mg        50 mg Oral Daily 02/01/21 0721 02/04/21 0921   01/31/21 1000  fluconazole (DIFLUCAN) tablet 100 mg  Status:  Discontinued        100 mg Oral Daily 01/31/21 0823 02/01/21 0721   01/30/21 1430  fluconazole (DIFLUCAN) tablet 150 mg        150 mg Oral  Once 01/30/21 1335 01/30/21 1745   01/20/21 1800  ampicillin-sulbactam (UNASYN) 1.5 g in sodium chloride 0.9 % 100 mL IVPB        1.5 g 200 mL/hr over 30 Minutes Intravenous Every 12 hours 01/20/21 1417 01/27/21 0604   01/20/21 1500  cefTRIAXone (ROCEPHIN) 2  g in sodium chloride 0.9 % 100 mL IVPB  Status:  Discontinued        2 g 200 mL/hr over 30 Minutes Intravenous Every 24 hours 01/19/21 1621 01/20/21 1414   01/19/21 2200  doxycycline (VIBRA-TABS) tablet 100 mg  Status:  Discontinued        100 mg Oral Every 12 hours 01/19/21 1621 01/20/21 1414   01/19/21 1445  cefTRIAXone (ROCEPHIN) 1 g in sodium chloride 0.9 % 100 mL IVPB        1 g 200 mL/hr over 30 Minutes Intravenous  Once 01/19/21 1443 01/19/21 1539   01/19/21 1445  doxycycline (VIBRA-TABS) tablet 100 mg        100 mg Oral  Once 01/19/21 1443 01/19/21 1508        I have personally reviewed the following labs and images: CBC: Recent Labs  Lab 01/31/21 0045 02/03/21 0109 02/05/21 0306  WBC 14.2* 13.7* 9.8  HGB 9.4* 10.1* 9.7*  HCT 29.7* 30.9* 30.6*  MCV 93.1 92.0  92.7  PLT 260 244 209   BMP &GFR Recent Labs  Lab 01/31/21 0045 02/02/21 0012 02/03/21 0109 02/05/21 0306  NA 135 131* 133* 132*  K 4.2 4.0 4.7 4.1  CL 103 98 100 100  CO2 _0 GLUCOSE 76 91 90 82  BUN _1 CREATININE 0.91 0.83 0.87 0.89  CALCIUM 8.4* 8.3* 8.3* 8.3*  MG  --   --   --  2.1  PHOS  --   --   --  3.1   Estimated Creatinine Clearance: 25.9 mL/min (by C-G formula based on SCr of 0.89 mg/dL). Liver & Pancreas: Recent Labs  Lab 02/05/21 0306  ALBUMIN 1.9*   No results for input(s): LIPASE, AMYLASE in the last 168 hours. No results for input(s): AMMONIA in the last 168 hours. Diabetic: No results for input(s): HGBA1C in the last 72 hours. No results for input(s): GLUCAP in the last 168 hours. Cardiac Enzymes: No results for input(s): CKTOTAL, CKMB, CKMBINDEX, TROPONINI in the last 168 hours. No results for input(s): PROBNP in the last 8760 hours. Coagulation Profile: No results for input(s): INR, PROTIME in the last 168 hours. Thyroid Function Tests: No results for input(s): TSH, T4TOTAL, FREET4, T3FREE, THYROIDAB in the last 72 hours. Lipid Profile: No results for input(s): CHOL, HDL, LDLCALC, TRIG, CHOLHDL, LDLDIRECT in the last 72 hours. Anemia Panel: No results for input(s): VITAMINB12, FOLATE, FERRITIN, TIBC, IRON, RETICCTPCT in the last 72 hours. Urine analysis:    Component Value Date/Time   COLORURINE YELLOW 01/06/2021 1957   APPEARANCEUR CLEAR 01/06/2021 1957   LABSPEC 1.012 01/06/2021 1957   PHURINE 5.0 01/06/2021 1957   GLUCOSEU NEGATIVE 01/06/2021 1957   GLUCOSEU NEGATIVE 04/11/2017 1536   HGBUR MODERATE (A) 01/06/2021 1957   BILIRUBINUR NEGATIVE 01/06/2021 1957   BILIRUBINUR neg 11/18/2016 [place] 01/06/2021 1957   PROTEINUR NEGATIVE 01/06/2021 1957   UROBILINOGEN 0.2 04/11/2017 1536   NITRITE NEGATIVE 01/06/2021 1957   LEUKOCYTESUR LARGE (A) 01/06/2021 1957   Sepsis Labs: Invalid input(s):  PROCALCITONIN, [place]  Microbiology: No results found for this or any previous visit (from the past 240 hour(s)).  Radiology Studies: No results found.    Calene Paradiso T. [place]  If 7PM-7AM, please contact night-coverage [email or web address] 02/05/2021, 12:47 PM

## 2021-02-05 NOTE — TOC Progression Note (Signed)
Transition of Care [facility]) - Progression Note    Patient Details  Name: TOSHIA LARKIN MRN: [id number] Date of Birth: 26-Nov-1927  Transition of Care [facility]) CM/SW Contact  Zenon Mayo, RN Phone Number: 02/05/2021, 3:23 PM  Clinical Narrative:    Family has decided that they want Home Hospice now, [place] with [facility] is aware and she has spoken with them today here at the hospital. She states they will try to get the DME out to the home and try to plan for dc tomorrow.  TOC will continue to follow for dc needs.    Expected Discharge Plan: [place] Barriers to Discharge: Continued Medical Work up  Expected Discharge Plan and Services Expected Discharge Plan: [place] In-house Referral: Clinical Social Work   Post Acute Care Choice: [place] Living arrangements for the past 2 months: [place], [place]                                       Social Determinants of Health (SDOH) Interventions    Readmission Risk Interventions No flowsheet data found.

## 2021-02-05 NOTE — Progress Notes (Signed)
[id number] [facility]) Hospital Liaison Note  ACC continues to follow while dispo. Plan continues to be discussed.   Please do not hesitate to call with any hospice related questions.    Thank you for the opportunity to participate in this patient's care.   Daphene Calamity, MSW [facility] Liaison  [phone]

## 2021-02-05 NOTE — TOC Progression Note (Addendum)
Transition of Care [facility]) - Progression Note    Patient Details  Name: Cathy [name] MRN: [id number] Date of Birth: 11-Dec-1927  Transition of Care [facility]) CM/SW [place], [place] Phone Number: 02/05/2021, 3:07 PM  Clinical Narrative:    CSW spoke with pt daughter in law who shared that they would like to send the pt  home with Hospice. They have found a 24 hour care service and they are waiting on oxygen to be delivered.  CSW updated NCM on pt DC plan.  Expected Discharge Plan: [place] Barriers to Discharge: Continued Medical Work up  Expected Discharge Plan and Services Expected Discharge Plan: [place] In-house Referral: Clinical Social Work   Post Acute Care Choice: [place] Living arrangements for the past 2 months: [place], [place]                                       Social Determinants of Health (SDOH) Interventions    Readmission Risk Interventions No flowsheet data found.

## 2021-02-05 NOTE — Progress Notes (Addendum)
[facility]) Hospital Liaison Note   Received request from Transitions of Care Manager, Neoma Laming, for hospice services at home after discharge. Chart and patient information under review by [facility] physician. Hospice eligibility has been approved.   Spoke with DIL/Barbara, D/Mary and son at bedside to initiate education related to hospice philosophy, services, and team approach to care. Patient/family verbalized understanding of information given. Per discussion, the plan is for patient to discharge home via [facility]AR once cleared to DC.    DME needs discussed. Patient has the following equipment in the home (Purchased privately): None Patient requests the following equipment for delivery: Oxygen (2L/min) Chux Bedside table Bedside Commode Address verified and is correct in the chart. Barbara/[phone] is the family member to contact to arrange time of equipment delivery.    Please send signed and completed DNR home with patient/family. Please provide prescriptions at discharge as needed to ensure ongoing symptom management.    [facility] information and contact numbers given to [place] information shared with TOC.   Please call with any questions/concerns.    Thank you for the opportunity to participate in this patient's care.   Daphene Calamity, MSW [facility] Liaison  [phone]

## 2021-02-05 NOTE — Plan of Care (Signed)

## 2021-02-05 NOTE — Plan of Care (Signed)

## 2021-02-06 DIAGNOSIS — Z66 Do not resuscitate: Secondary | ICD-10-CM

## 2021-02-06 MED ORDER — GERHARDT'S BUTT CREAM: 1.0000 "application " | TOPICAL_CREAM | Freq: Every day | CUTANEOUS | 0 refills | Status: AC

## 2021-02-06 MED ORDER — LORAZEPAM 0.5 MG PO TABS: 0.5000 mg | ORAL_TABLET | Freq: Four times a day (QID) | ORAL | 0 refills | Status: AC | PRN

## 2021-02-06 MED ORDER — ONDANSETRON HCL 4 MG PO TABS: 4.0000 mg | ORAL_TABLET | Freq: Every day | ORAL | 0 refills | Status: AC | PRN

## 2021-02-06 MED ORDER — MORPHINE SULFATE (CONCENTRATE) 10 MG /0.5 ML PO SOLN: 10.0000 mg | ORAL | 0 refills | Status: AC | PRN

## 2021-02-06 MED ORDER — SENNOSIDES-DOCUSATE SODIUM 8.6-50 MG PO TABS: 1.0000 | ORAL_TABLET | Freq: Two times a day (BID) | ORAL | Status: AC | PRN

## 2021-02-06 MED ORDER — IPRATROPIUM-ALBUTEROL 0.5-2.5 (3) MG/3ML IN SOLN: 3.0000 mL | Freq: Four times a day (QID) | RESPIRATORY_TRACT | 0 refills | Status: AC | PRN

## 2021-02-06 NOTE — Discharge Summary (Signed)
Physician Discharge Summary  Cathy [name] NID:[id number] DOB: 08-03-1927 DOA: 01/19/2021  PCP: Biagio Borg, MD  Admit date: 01/19/2021 Discharge date: 02/06/2021 Admitted From: SNF Disposition:  Home with home hospice Recommendations for Outpatient Follow-up:  Follow ups as below. Please obtain CBC/BMP/Mag at follow up Please follow up on the following pending results: None Home Health: Home hospice Equipment/Devices: Hospitalist to provide appropriate DME. Discharge Condition: Stable but guarded prognosis CODE STATUS: DNR/DNI  Hospital Course: 87 y.o. female with medical history of hypertension, dyslipidemia, heart failure, paroxysmal atrial flutter, hiatal hernia, osteoarthritis presented with cough and dyspnea.  She had recent hospitalization from 01/06/2021 to 01/12/2021 for acute hypoxemic respiratory failure due to pneumonia and rhinovirus infection.  She received antibiotic therapy and was discharged to skilled nursing facility on supplemental oxygen 3 L/min. At facility she continued to have cough and was requiring 4 L of oxygen, O2 sats was 88%.  She was brought to the ED.  Chest x-ray showed new interstitial/alveolar infiltrate right upper lobe and left lower lobe.  CT abdomen and pelvis with increased panchamber cardiomegaly, increased large right and moderate left pleural effusion, anasarca, chronically collapsed RLL with compressive atelectasis, GG opacities in anterior lung bases, small amount of ascites and chronic large hiatal hernia with intrathoracic stomach and mid transverse colon with colonic narrowing at the hernia mass and innumerable low-density irregular lesions in the spleen.  Patient required up to 20 L of oxygen at some point but oxygen requirement improved and weaned to 2 L by nasal cannula.  However, poor overall prognosis. Patient and family met with palliative care.  She is discharged home with home hospice.  See individual problem list below for more on  hospital course.  Discharge Diagnoses:  Acute hypoxemic respiratory failure-multifactorial including multifocal pneumonia, pleural effusion, heart failure and large hiatal hernia.  Required up to 20 L at some point but improved.  Currently on 2 L at rest. -Completed 7 days of IV Unasyn for possible aspiration pneumonia -Continue supportive care -Home with home hospice.   Large hiatal hernia-could be contributing to her respiratory symptoms. -Chronic- suspect worsening her breathing issues -Elevate head of bed at least 30 degree   Bilateral pleural effusion: CT abdomen and pelvis showed large right and moderate left pleural effusion.  Parapneumonic?[name]  Deferred thoracocentesis given improvement in respiratory status and goal of care with emphasis on comfort measures.   Constipation: CT Scan shows: Moderate to severe stool retention -Bowel regimen.   ST elevation: Cardiology consulted and was not convinced of STEMI.  Patient without chest pain.  Tropes negative.  High risk for procedure. -Medical management   Acute on chronic diastolic CHF: Limited TTE on 11/27 with LVEF of 65 to 70%, indeterminate DD, RVSP of 46, severe LAE, severe RAE, moderate TVR. BNP 220 on presentation.  Imaging with significant pleural effusion bilaterally. Appears euvolemic on exam except for trace BLE edema. -Monitor fluid status and respiratory status.   Paroxysmal A. fib/a flutter: Rate controlled. -Continue amiodarone and Eliquis   Vaginal yeast infection: Completed 5 days of Diflucan.   Hypertension: Normotensive after discontinuing Imdur.   Hypothyroidism -Continue Synthroid   Dyslipidemia -Continue atorvastatin   Hypovolemic hyponatremia: Stable.     Normocytic anemia: H&H relatively stable.             Recent Labs    01/19/21 1355 01/21/21 0201 01/22/21 1011 01/23/21 0106 01/24/21 0054 01/26/21 0049 01/29/21 0120 01/31/21 0045 02/03/21 0109 02/05/21 0306  HGB 11.3* 8.8* 9.6*  8.6* 9.7*  8.6* 9.6* 9.4* 10.1* 9.7*  -Continue monitoring   Right upper extremity edema: venous duplex negative for DVT.  Likely extravasation. -Already on anticoagulation.   Goals of care counseling-patient is DNR/DNI which is appropriate.   -Home with home hospice.   Leukocytosis/bandemia: Resolved. Body mass index is 25.1 kg/m.           Discharge Exam: Vitals:   02/06/21 0300 02/06/21 0436 02/06/21 0615 02/06/21 0731  BP:  135/63  133/64  Pulse: (!) 53 (!) 53  64  Temp:  98 F (36.7 C)  97.8 F (36.6 C)  Resp: _0 Height:      Weight:   49 kg   SpO2: 97% 93%  94%  TempSrc:  Oral    BMI (Calculated):   25.1      GENERAL: Frail looking elderly female.  No apparent distress. HEENT: MMM.  Vision and hearing grossly intact.  NECK: Supple.  No apparent JVD.  RESP: 96% on 2 L.  No IWOB.  Fair aeration bilaterally. CVS:  RRR. Heart sounds normal.  ABD/GI/GU: Bowel sounds present. Soft. Non tender.  MSK/EXT:  Moves extremities. No apparent deformity.  RUE swelling from extravasation. SKIN: no apparent skin lesion or wound NEURO: Awake and alert.  Oriented fairly.  No apparent focal neuro deficit. PSYCH: Calm. Normal affect.   Discharge Instructions  Discharge Instructions     Diet general   Complete by: As directed    Increase activity slowly   Complete by: As directed    No wound care   Complete by: As directed       Allergies as of 02/06/2021       Reactions   Amoxicillin-pot Clavulanate Diarrhea   Has patient had a PCN reaction causing immediate rash, facial/tongue/throat swelling, SOB or lightheadedness with hypotension: no Has patient had a PCN reaction causing severe rash involving mucus membranes or skin necrosis:  no Has patient had a PCN reaction that required hospitalization: no Has patient had a PCN reaction occurring within the last 10 years no If all of the above answers are "NO", then may proceed with Cephalosporin use.   Penicillins Diarrhea    Sulfonamide Derivatives Nausea Only        Medication List     STOP taking these medications    Delsym Child Cough+Sore Throat 325-10 MG/10ML Liqd Generic drug: Acetaminophen-DM   ibuprofen 200 MG tablet Commonly known as: ADVIL   isosorbide mononitrate 60 MG 24 hr tablet Commonly known as: IMDUR       TAKE these medications    acetaminophen 325 MG tablet Commonly known as: TYLENOL Take 650 mg by mouth every 8 (eight) hours. Scheduled   amiodarone 200 MG tablet Commonly known as: PACERONE TAKE 1 TABLET DAILY   atorvastatin 20 MG tablet Commonly known as: LIPITOR TAKE 1 TABLET DAILY AT 6:00IN THE EVENING What changed:  how much to take how to take this when to take this additional instructions   bacitracin ointment Apply 1 application topically 2 (two) times daily.   calcium-vitamin D 500-200 MG-UNIT tablet Commonly known as: OSCAL WITH D Take 1 tablet by mouth daily.   dextromethorphan-guaiFENesin 30-600 MG 12hr tablet Commonly known as: MUCINEX DM Take 1 tablet by mouth 2 (two) times daily.   Eliquis 2.5 MG Tabs tablet Generic drug: apixaban TAKE 1 TABLET TWICE A DAY What changed: how much to take   ENSURE CLEAR PO Take 120 mLs by mouth in  the morning and at bedtime. With lunch and dinner   esomeprazole 40 MG capsule Commonly known as: NEXIUM Take 1 capsule (40 mg total) by mouth daily at 12 noon.   furosemide 20 MG tablet Commonly known as: LASIX Take 1 tablet (20 mg total) by mouth every other day.   Gerhardt's butt cream Crea Apply 1 application topically daily. Start taking on: February 07, 2021   ipratropium-albuterol 0.5-2.5 (3) MG/3ML Soln Commonly known as: DUONEB Take 3 mLs by nebulization every 6 (six) hours as needed.   LORazepam 0.5 MG tablet Commonly known as: Ativan Take 1 tablet (0.5 mg total) by mouth every 6 (six) hours as needed for up to 5 days for anxiety.   morphine CONCENTRATE 10 mg / 0.5 ml concentrated  solution Take 0.5 mLs (10 mg total) by mouth every 3 (three) hours as needed for moderate pain, severe pain or shortness of breath.   ondansetron 4 MG tablet Commonly known as: Zofran Take 1 tablet (4 mg total) by mouth daily as needed for up to 365 doses for nausea or vomiting.   OXYGEN Inhale 3 L into the lungs continuous.   polyethylene glycol 17 g packet Commonly known as: MIRALAX / GLYCOLAX Take 17 g by mouth daily.   senna-docusate 8.6-50 MG tablet Commonly known as: Senokot-S Take 1 tablet by mouth 2 (two) times daily as needed for moderate constipation.   solifenacin 10 MG tablet Commonly known as: VESICARE Take 1 tablet (10 mg total) by mouth daily.   Synthroid 125 MCG tablet Generic drug: levothyroxine TAKE 1/2 TABLET DAILY. What changed:  how much to take how to take this when to take this   Transport Chair Misc Use as directed three times daily   vitamin B-12 1000 MCG tablet Commonly known as: CYANOCOBALAMIN Take 1 tablet (1,000 mcg total) by mouth daily.        Consultations: Pulmonology Cardiology Palliative medicine  Procedures/Studies:   CT ABDOMEN PELVIS W CONTRAST  Result Date: 02/02/2021 CLINICAL DATA:  Abdominal pain. EXAM: CT ABDOMEN AND PELVIS WITH CONTRAST TECHNIQUE: Multidetector CT imaging of the abdomen and pelvis was performed using the standard protocol following bolus administration of intravenous contrast. CONTRAST:  56m OMNIPAQUE IOHEXOL 300 MG/ML  SOLN COMPARISON:  Abdominal and pelvis CT with contrast 02/21/2018 and 11/27/2016. FINDINGS: Lower chest: Again noted is a large hiatal hernia with intrathoracic stomach and mid transverse colon. Large right and moderate to large left pleural effusions. There previously were moderate and small pleural effusions, respectively. There is a chronic right lower lobe collapse along side the effusion and partial collapse of left lower lobe. Increased patchy ground-glass haziness in the more  anterior lung bases which could be pneumonitis or ground-glass edema. Moderate to severe cardiomegaly is increased in all chambers. Three-vessel coronary artery calcifications are again noted and heavy calcification in the mitral ring Hepatobiliary: Chronic post cholecystectomy biliary dilatation, actually is less pronounced today. The common bile duct is 8.5 mm, previously 12 mm. No liver mass is seen. Unchanged prominent liver length 19 cm. Pancreas: No mass or ductal dilatation. Spleen: Stable mild splenomegaly with innumerable low-attenuation lesions scattered throughout the substance. Largest of these is 4.0 cm posteriorly, and is not significantly changed. Some of the smaller lesions are slightly larger than previously however, but according to the report of the last CT there was no PET positivity in these. There may be a few new smaller sized lesions. Adrenals/Urinary Tract: heterogeneous left adrenal mass measures 3.4 x 2.5 cm,  previously 3.0 x 1.9 cm. The right adrenal gland is normal. Renal cortical thinning is similar to prior studies with small renal cysts but no concerning lesion, calculus or hydronephrosis. No bladder thickening. Stomach/Bowel: As above, large hiatal hernia again noted with intrathoracic stomach and mid transverse colon. Again, the large intestinal wall is narrowed where it enters and exits the hernia sac but this was seen previously, without wall thickening. An appendix is not seen in this patient. Small bowel opacification and caliber are normal. There are colonic diverticula without focal inflammatory reaction. Moderate to severe stool retention. Vascular/Lymphatic: The abdominal aorta is tortuous and moderately calcified with calcifications extending into the iliac arteries. There is no appreciable adenopathy. Other: There is interval increased body wall anasarca and mesenteric haziness. There is a small volume of abdominal and pelvic ascites but no large pockets. There is no free  air or hemorrhage, no abscess. Reproductive: Uterus is absent. Numerous pelvic phleboliths. No adnexal mass. Musculoskeletal: Osteopenia, lumbar S shaped scoliosis and advanced degenerative changes of the spine are redemonstrated. Interval left hip nailing. IMPRESSION: 1. There is increased panchamber cardiomegaly and increased large right and moderate left pleural effusions. 2. There is increased body wall anasarca and mesenteric haziness, which could relate to hepatic dysfunction, malnutrition or could be congestive or from fluid overload. 3. Chronically collapsed right lower lobe with compressive atelectasis of the remaining visible posterior lungs and ground-glass opacities in the more anterior lung bases which could be pneumonitis or ground-glass edema. 4. Small amount of ascites. 5. Chronic large hiatal hernia with intrathoracic stomach and mid transverse colon with colonic narrowing at the hernia mouth but no wall thickening. There is moderate to severe stool retention in most of the colon except for the relatively decompressed rectosigmoid segments but there is no dilated small bowel. 6. Innumerable low-density irregular lesions in the spleen. Some of the smaller lesions are larger than previously and there appear to be at least a few more smaller lesions new from prior studies. This reportedly did not show increased PET activity but a repeat PET-CT should be considered given the increase in size of some of the smaller lesions and the slight increased size of the previously noted left adrenal mass, previously thought to be an adenoma. 7. Chronic postcholecystectomy biliary dilatation, less pronounced today than previously. 8. Aortic and coronary artery atherosclerosis. Electronically Signed   By: Telford Nab M.D.   On: 02/02/2021 06:30   DG CHEST PORT 1 VIEW  Result Date: 02/01/2021 CLINICAL DATA:  Dyspnea EXAM: PORTABLE CHEST 1 VIEW COMPARISON:  Chest x-rays dated 01/23/2021 and 01/19/2021. FINDINGS:  Increased opacities throughout the RIGHT lung, particularly peripheral, most likely related to increased pleural effusion. Additional patchy interstitial and alveolar opacities are stable bilaterally. Additional dense opacity at the LEFT lung base is stable. Heart size and mediastinal contours are grossly stable. No pneumothorax is seen. IMPRESSION: 1. Suspect increased RIGHT pleural effusion causing increased opacity of the RIGHT hemithorax. 2. Stable patchy interstitial and alveolar opacities bilaterally, edema versus multifocal pneumonia. 3. Cardiomegaly. Electronically Signed   By: Franki Cabot M.D.   On: 02/01/2021 08:14   DG Chest Port 1 View  Result Date: 01/19/2021 CLINICAL DATA:  Shortness of breath EXAM: PORTABLE CHEST 1 VIEW COMPARISON:  Chest x-ray 01/09/2021 FINDINGS: Heart is enlarged. Calcified plaques in the aortic arch. Large hiatal hernia again seen which contains bowel. Interval development of diffuse increased interstitial and patchy airspace opacities throughout the right lung and more mildly in the left  lung. Small right pleural effusion and possible small left pleural effusion. No pneumothorax. IMPRESSION: 1. Interval development of extensive pulmonary opacities bilaterally, right greater than left, likely representing pneumonia. 2. Small right pleural effusion and possible small left pleural effusion. 3. Cardiomegaly. 4. Large hiatal hernia. Electronically Signed   By: Ofilia Neas M.D.   On: 01/19/2021 14:30   DG CHEST PORT 1 VIEW  Result Date: 01/09/2021 CLINICAL DATA:  CHF EXAM: PORTABLE CHEST 1 VIEW COMPARISON:  Chest x-ray 01/06/2021 FINDINGS: Heart is enlarged. Mediastinum appears grossly stable. Calcified plaques in the thoracic aorta. Large hiatal hernia containing bowel. Interval increasing hazy opacities in the right lower lung zone. Stable small to moderate left pleural effusion. No pneumothorax. IMPRESSION: 1. Cardiomegaly. 2. Increasing hazy opacities at the right  lower lung zone likely represents layering pleural effusion with atelectasis. 3. Stable small to moderate left pleural effusion. 4. Large hiatal hernia. Electronically Signed   By: Ofilia Neas M.D.   On: 01/09/2021 13:05   DG Chest Port 1V same Day  Result Date: 01/23/2021 CLINICAL DATA:  Dyspnea EXAM: PORTABLE CHEST 1 VIEW COMPARISON:  01/19/2021 FINDINGS: Patient is significantly rotated. Low lung volumes. Persistent bilateral pulmonary opacities. Probable bilateral pleural effusions. Similar cardiomediastinal contours. Large hiatal hernia. IMPRESSION: Low lung volumes with persistent bilateral pulmonary opacities and probable bilateral pleural effusions. May reflect edema or pneumonia. Electronically Signed   By: Macy Mis M.D.   On: 01/23/2021 12:09   VAS [place] UPPER EXTREMITY VENOUS DUPLEX  Result Date: 01/26/2021 UPPER VENOUS STUDY  Patient Name:  HESPER VENTURELLA  Date of Exam:   01/26/2021 Medical Rec #: [id number]      Accession #:    [id number] Date of Birth: 05/20/1927      Patient Gender: F Patient Age:   38 years Exam Location:  [facility] Procedure:      VAS [place] UPPER EXTREMITY VENOUS DUPLEX Referring Phys: Frederich Chick LAMA --------------------------------------------------------------------------------  Indications: Edema Limitations: Edema and poor ultrasound/tissue interface. Comparison Study: No prior study Performing Technologist: Sharion Dove RVS  Examination Guidelines: A complete evaluation includes B-mode imaging, spectral Doppler, color Doppler, and power Doppler as needed of all accessible portions of each vessel. Bilateral testing is considered an integral part of a complete examination. Limited examinations for reoccurring indications may be performed as noted.  Right Findings: +----------+------------+---------+-----------+----------+--------------+ RIGHT     CompressiblePhasicitySpontaneousProperties   Summary      +----------+------------+---------+-----------+----------+--------------+ IJV           Full       Yes       Yes                             +----------+------------+---------+-----------+----------+--------------+ Subclavian    Full       Yes       Yes                             +----------+------------+---------+-----------+----------+--------------+ Axillary                 Yes       Yes                             +----------+------------+---------+-----------+----------+--------------+ Brachial      Full       Yes       Yes                             +----------+------------+---------+-----------+----------+--------------+  Radial        Full                                                 +----------+------------+---------+-----------+----------+--------------+ Ulnar         Full                                                 +----------+------------+---------+-----------+----------+--------------+ Cephalic      Full                                                 +----------+------------+---------+-----------+----------+--------------+ Basilic                                             Not visualized +----------+------------+---------+-----------+----------+--------------+  Left Findings: +----------+------------+---------+-----------+----------+-------+ LEFT      CompressiblePhasicitySpontaneousPropertiesSummary +----------+------------+---------+-----------+----------+-------+ Subclavian               Yes       Yes                      +----------+------------+---------+-----------+----------+-------+  Summary:  Right: No evidence of deep vein thrombosis in the upper extremity. No evidence of superficial vein thrombosis in the upper extremity.  Left: No evidence of thrombosis in the subclavian.  *See table(s) above for measurements and observations.  Diagnosing physician: Monica Martinez MD Electronically signed by Monica Martinez  MD on 01/26/2021 at 2:08:23 PM.    Final    ECHOCARDIOGRAM LIMITED  Result Date: 01/25/2021    ECHOCARDIOGRAM LIMITED REPORT   Patient Name:   JULIONNA MARCZAK Date of Exam: 01/25/2021 Medical Rec #:  [id number]     Height:       55.0 in Accession #:    [id number]    Weight:       107.0 lb Date of Birth:  1927/07/01     BSA:          1.343 m Patient Age:    87 years      BP:           95/60 mmHg Patient Gender: F             HR:           77 bpm. Exam Location:  Inpatient Procedure: Limited Color Doppler, Cardiac Doppler and Limited Echo Indications:    Acute respiratory failure with hypoxia  History:        Patient has prior history of Echocardiogram examinations, most                 recent 11/28/2016. Arrythmias:Atrial Flutter and Atrial                 Fibrillation; Risk Factors:Hypertension and Dyslipidemia.  Sonographer:    Johny Chess RDCS Referring Phys: [place]  1. Left ventricular ejection fraction, by estimation, is 65 to 70%. The left ventricle has normal function. The left ventricle has no regional  wall motion abnormalities. Left ventricular diastolic function could not be evaluated. There is the interventricular septum is flattened in systole and diastole, consistent with right ventricular pressure and volume overload.  2. Right ventricular systolic function is moderately reduced. The right ventricular size is severely enlarged. There is moderately elevated pulmonary artery systolic pressure. The estimated right ventricular systolic pressure is 46.9 mmHg.  3. Left atrial size was severely dilated.  4. Right atrial size was severely dilated.  5. Moderate pleural effusion in the left lateral region.  6. The mitral valve is degenerative. Trivial mitral valve regurgitation. No evidence of mitral stenosis. Moderate mitral annular calcification.  7. Tricuspid valve regurgitation is moderate.  8. The aortic valve is tricuspid. There is mild calcification of the aortic valve.  Aortic valve regurgitation is trivial. Aortic valve sclerosis is present, with no evidence of aortic valve stenosis.  9. The inferior vena cava is normal in size with <50% respiratory variability, suggesting right atrial pressure of 8 mmHg. FINDINGS  Left Ventricle: Left ventricular ejection fraction, by estimation, is 65 to 70%. The left ventricle has normal function. The left ventricle has no regional wall motion abnormalities. The left ventricular internal cavity size was normal in size. There is  no left ventricular hypertrophy. The interventricular septum is flattened in systole and diastole, consistent with right ventricular pressure and volume overload. Left ventricular diastolic function could not be evaluated. Left ventricular diastolic function could not be evaluated due to atrial fibrillation. Right Ventricle: The right ventricular size is severely enlarged. No increase in right ventricular wall thickness. Right ventricular systolic function is moderately reduced. There is moderately elevated pulmonary artery systolic pressure. The tricuspid regurgitant velocity is 3.07 m/s, and with an assumed right atrial pressure of 8 mmHg, the estimated right ventricular systolic pressure is 62.9 mmHg. Left Atrium: Left atrial size was severely dilated. Right Atrium: Right atrial size was severely dilated. Pericardium: Trivial pericardial effusion is present. Mitral Valve: The mitral valve is degenerative in appearance. Moderate mitral annular calcification. Trivial mitral valve regurgitation. No evidence of mitral valve stenosis. Tricuspid Valve: The tricuspid valve is grossly normal. Tricuspid valve regurgitation is moderate . No evidence of tricuspid stenosis. Aortic Valve: The aortic valve is tricuspid. There is mild calcification of the aortic valve. Aortic valve regurgitation is trivial. Aortic valve sclerosis is present, with no evidence of aortic valve stenosis. Pulmonic Valve: The pulmonic valve was grossly  normal. Pulmonic valve regurgitation is not visualized. No evidence of pulmonic stenosis. Aorta: The aortic root and ascending aorta are structurally normal, with no evidence of dilitation. Venous: The inferior vena cava is normal in size with less than 50% respiratory variability, suggesting right atrial pressure of 8 mmHg. Additional Comments: There is a moderate pleural effusion in the left lateral region. LEFT VENTRICLE PLAX 2D LVIDd:         3.50 cm LVIDs:         2.30 cm LV PW:         1.20 cm LV IVS:        1.00 cm  IVC IVC diam: 1.50 cm LEFT ATRIUM             Index LA diam:        4.30 cm 3.20 cm/m LA Vol (A2C):   85.5 ml 63.64 ml/m LA Vol (A4C):   90.2 ml 67.14 ml/m LA Biplane Vol: 97.7 ml 72.73 ml/m  AORTIC VALVE LVOT Vmax:   124.00 cm/s LVOT Vmean:  92.600 cm/s LVOT VTI:  0.233 m  AORTA Ao Asc diam: 3.50 cm TRICUSPID VALVE TR Peak grad:   37.7 mmHg TR Vmax:        307.00 cm/s  SHUNTS Systemic VTI: 0.23 m Eleonore Chiquito MD Electronically signed by Eleonore Chiquito MD Signature Date/Time: 01/25/2021/5:15:48 PM    Final        The results of significant diagnostics from this hospitalization (including imaging, microbiology, ancillary and laboratory) are listed below for reference.     Microbiology: No results found for this or any previous visit (from the past 240 hour(s)).   Labs:  CBC: Recent Labs  Lab 01/31/21 0045 02/03/21 0109 02/05/21 0306  WBC 14.2* 13.7* 9.8  HGB 9.4* 10.1* 9.7*  HCT 29.7* 30.9* 30.6*  MCV 93.1 92.0 92.7  PLT 260 244 209   BMP &GFR Recent Labs  Lab 01/31/21 0045 02/02/21 0012 02/03/21 0109 02/05/21 0306  NA 135 131* 133* 132*  K 4.2 4.0 4.7 4.1  CL 103 98 100 100  CO2 _0 GLUCOSE 76 91 90 82  BUN _1 CREATININE 0.91 0.83 0.87 0.89  CALCIUM 8.4* 8.3* 8.3* 8.3*  MG  --   --   --  2.1  PHOS  --   --   --  3.1   Estimated Creatinine Clearance: 26 mL/min (by C-G formula based on SCr of 0.89 mg/dL). Liver & Pancreas: Recent  Labs  Lab 02/05/21 0306  ALBUMIN 1.9*   No results for input(s): LIPASE, AMYLASE in the last 168 hours. No results for input(s): AMMONIA in the last 168 hours. Diabetic: No results for input(s): HGBA1C in the last 72 hours. No results for input(s): GLUCAP in the last 168 hours. Cardiac Enzymes: No results for input(s): CKTOTAL, CKMB, CKMBINDEX, TROPONINI in the last 168 hours. No results for input(s): PROBNP in the last 8760 hours. Coagulation Profile: No results for input(s): INR, PROTIME in the last 168 hours. Thyroid Function Tests: No results for input(s): TSH, T4TOTAL, FREET4, T3FREE, THYROIDAB in the last 72 hours. Lipid Profile: No results for input(s): CHOL, HDL, LDLCALC, TRIG, CHOLHDL, LDLDIRECT in the last 72 hours. Anemia Panel: No results for input(s): VITAMINB12, FOLATE, FERRITIN, TIBC, IRON, RETICCTPCT in the last 72 hours. Urine analysis:    Component Value Date/Time   COLORURINE YELLOW 01/06/2021 1957   APPEARANCEUR CLEAR 01/06/2021 1957   LABSPEC 1.012 01/06/2021 1957   PHURINE 5.0 01/06/2021 1957   GLUCOSEU NEGATIVE 01/06/2021 1957   GLUCOSEU NEGATIVE 04/11/2017 1536   HGBUR MODERATE (A) 01/06/2021 1957   BILIRUBINUR NEGATIVE 01/06/2021 1957   BILIRUBINUR neg 11/18/2016 [place] 01/06/2021 1957   PROTEINUR NEGATIVE 01/06/2021 1957   UROBILINOGEN 0.2 04/11/2017 1536   NITRITE NEGATIVE 01/06/2021 1957   LEUKOCYTESUR LARGE (A) 01/06/2021 1957   Sepsis Labs: Invalid input(s): PROCALCITONIN, LACTICIDVEN   Time coordinating discharge: 55 minutes  SIGNED:  Mercy Riding, MD  [facility] 02/06/2021, 9:59 AM

## 2021-02-06 NOTE — Plan of Care (Signed)

## 2021-02-06 NOTE — TOC Transition Note (Signed)
Transition of Care [facility]) - CM/SW Discharge Note   Patient Details  Name: Cathy [name] MRN: [id number] Date of Birth: [birth year]  [facility] CM/SW Contact:  Zenon Mayo, RN Phone Number: 02/06/2021, 11:43 AM   Clinical Narrative:    Per Daphene Calamity with [facility] she spoke with [name] and the DME is supposed to be delivered between 1 and 2 pm.  Patient will need ambulance transport home today.  NCM has scheduled [facility] transport pickup for 4:30 and NCM has notified [name] about 4:30 transport time, she states this is a good time. Packet on unit.    Final next level of care: Skilled Nursing Facility Barriers to Discharge: Continued Medical Work up   Patient Goals and CMS Choice Patient states their goals for this hospitalization and ongoing recovery are:: Rehab CMS Medicare.gov Compare Post Acute Care list provided to:: Patient Choice offered to / list presented to : Patient  Discharge Placement                       Discharge Plan and Services In-house Referral: Clinical Social Work   Post Acute Care Choice: [place]                               Social Determinants of Health (SDOH) Interventions     Readmission Risk Interventions No flowsheet data found.

## 2021-02-06 NOTE — TOC Progression Note (Addendum)
Transition of Care [facility]) - Progression Note    Patient Details  Name: Cathy [name] MRN: [id number] Date of Birth: 1927/05/15  Transition of Care [facility]) CM/SW Contact  Zenon Mayo, RN Phone Number: 02/06/2021, 10:16 AM  Clinical Narrative:    Per Daphene Calamity with [facility] she spoke with [name] and the DME is supposed to be delivered between 1 and 2 pm.  Patient will need ambulance transport home today.  NCM has scheduled [facility] transport pickup for 4:30 and NCM has notified [name] about 4:30 transport time, she states this is a good time.   Expected Discharge Plan: [place] Barriers to Discharge: Continued Medical Work up  Expected Discharge Plan and Services Expected Discharge Plan: [place] In-house Referral: Clinical Social Work   Post Acute Care Choice: [place] Living arrangements for the past 2 months: [place], [place] Expected Discharge Date: 02/06/21                                     Social Determinants of Health (SDOH) Interventions    Readmission Risk Interventions No flowsheet data found.

## 2021-02-06 NOTE — Progress Notes (Signed)
PIV removed without complication, site clean dry and intact. [facility] here to transport patient home with hospice. Discharge packet given to transport and signed DNR form.

## 2021-02-06 NOTE — Progress Notes (Signed)
[facility]) Hospital Liaison Note  Please send signed and completed DNR with patient/family upon discharge. Please provide prescriptions at discharge as needed to ensure ongoing symptom management and a transport packet.   [facility] information and contact numbers given to family and above information shared with TOC.    Please call with any questions/concerns.    Thank you for the opportunity to participate in this patient's care   Daphene Calamity, MSW [facility] Liaison  [phone]

## 2021-02-25 ENCOUNTER — Ambulatory Visit: Payer: Medicare Other | Admitting: Dermatology

## 2021-03-19 ENCOUNTER — Other Ambulatory Visit: Payer: Self-pay | Admitting: Cardiology

## 2021-03-25 ENCOUNTER — Other Ambulatory Visit: Payer: Self-pay | Admitting: Cardiology

## 2021-03-27 ENCOUNTER — Telehealth: Payer: Self-pay | Admitting: Cardiology

## 2021-03-27 NOTE — Telephone Encounter (Signed)
Spoke to [facility] that Dr. Orpah Melter prescribed pt Haldol 5mg .  They were following up because pt is taking Amiodarone.  Aware I will confirm with family that pt is taking Haldol and review with Dr. Curt Bears next week if so.  Called POA, Cookie -- she informs me that patient died on Tuesday 2021-04-22.  Condolences given. [facility] called back and informed of patients passing.  Will inform medical records next week.

## 2021-03-27 NOTE — Telephone Encounter (Signed)
Pt c/o medication issue:  1. Name of Medication:  amiodarone (PACERONE) 200 MG tablet  2. How are you currently taking this medication (dosage and times per day)?   3. Are you having a reaction (difficulty breathing--STAT)?   4. What is your medication issue?   Daleen Snook with [facility] is calling in regarding Amiodarone Rx. She states patient is also on Haloperidol and it may cause an interaction. She would like a call back to clarify prior to distributing to the patient.  Phone #: [phone]  Option:2 / reference #: [id number]

## 2021-04-01 DEATH — deceased

## 2021-04-06 ENCOUNTER — Ambulatory Visit: Payer: Medicare Other | Admitting: Physician Assistant
